# Patient Record
Sex: Male | Born: 1935 | Race: White | Hispanic: No | Smoking: Former smoker
Health system: Southern US, Community
[De-identification: ages and names within clinical notes are randomized; demographics above are authoritative.]

## PROBLEM LIST (undated history)

## (undated) DIAGNOSIS — K219 Gastro-esophageal reflux disease without esophagitis: Secondary | ICD-10-CM

## (undated) DIAGNOSIS — Z8719 Personal history of other diseases of the digestive system: Secondary | ICD-10-CM

## (undated) DIAGNOSIS — Z950 Presence of cardiac pacemaker: Secondary | ICD-10-CM

## (undated) DIAGNOSIS — M7120 Synovial cyst of popliteal space [Baker], unspecified knee: Secondary | ICD-10-CM

## (undated) DIAGNOSIS — I714 Abdominal aortic aneurysm, without rupture, unspecified: Secondary | ICD-10-CM

## (undated) DIAGNOSIS — R42 Dizziness and giddiness: Secondary | ICD-10-CM

## (undated) DIAGNOSIS — E785 Hyperlipidemia, unspecified: Secondary | ICD-10-CM

## (undated) DIAGNOSIS — M549 Dorsalgia, unspecified: Secondary | ICD-10-CM

## (undated) DIAGNOSIS — I119 Hypertensive heart disease without heart failure: Secondary | ICD-10-CM

## (undated) DIAGNOSIS — I480 Paroxysmal atrial fibrillation: Secondary | ICD-10-CM

## (undated) DIAGNOSIS — I219 Acute myocardial infarction, unspecified: Secondary | ICD-10-CM

## (undated) DIAGNOSIS — I739 Peripheral vascular disease, unspecified: Secondary | ICD-10-CM

## (undated) DIAGNOSIS — I10 Essential (primary) hypertension: Secondary | ICD-10-CM

## (undated) DIAGNOSIS — L719 Rosacea, unspecified: Secondary | ICD-10-CM

## (undated) DIAGNOSIS — H9190 Unspecified hearing loss, unspecified ear: Secondary | ICD-10-CM

## (undated) DIAGNOSIS — J189 Pneumonia, unspecified organism: Secondary | ICD-10-CM

## (undated) DIAGNOSIS — I350 Nonrheumatic aortic (valve) stenosis: Secondary | ICD-10-CM

## (undated) DIAGNOSIS — R55 Syncope and collapse: Secondary | ICD-10-CM

## (undated) DIAGNOSIS — G4733 Obstructive sleep apnea (adult) (pediatric): Secondary | ICD-10-CM

## (undated) DIAGNOSIS — I209 Angina pectoris, unspecified: Secondary | ICD-10-CM

## (undated) DIAGNOSIS — M1990 Unspecified osteoarthritis, unspecified site: Secondary | ICD-10-CM

## (undated) DIAGNOSIS — Z9989 Dependence on other enabling machines and devices: Secondary | ICD-10-CM

## (undated) DIAGNOSIS — I2510 Atherosclerotic heart disease of native coronary artery without angina pectoris: Secondary | ICD-10-CM

## (undated) DIAGNOSIS — I38 Endocarditis, valve unspecified: Secondary | ICD-10-CM

## (undated) DIAGNOSIS — J449 Chronic obstructive pulmonary disease, unspecified: Secondary | ICD-10-CM

## (undated) DIAGNOSIS — I779 Disorder of arteries and arterioles, unspecified: Secondary | ICD-10-CM

## (undated) DIAGNOSIS — D696 Thrombocytopenia, unspecified: Secondary | ICD-10-CM

## (undated) DIAGNOSIS — I495 Sick sinus syndrome: Secondary | ICD-10-CM

## (undated) DIAGNOSIS — R011 Cardiac murmur, unspecified: Secondary | ICD-10-CM

## (undated) DIAGNOSIS — C61 Malignant neoplasm of prostate: Secondary | ICD-10-CM

## (undated) DIAGNOSIS — K7011 Alcoholic hepatitis with ascites: Secondary | ICD-10-CM

## (undated) HISTORY — DX: Peripheral vascular disease, unspecified: I73.9

## (undated) HISTORY — DX: Obstructive sleep apnea (adult) (pediatric): G47.33

## (undated) HISTORY — PX: APPENDECTOMY: SHX54

## (undated) HISTORY — PX: CARDIAC VALVE REPLACEMENT: SHX585

## (undated) HISTORY — DX: Dizziness and giddiness: R42

## (undated) HISTORY — DX: Abdominal aortic aneurysm, without rupture: I71.4

## (undated) HISTORY — DX: Thrombocytopenia, unspecified: D69.6

## (undated) HISTORY — DX: Gastro-esophageal reflux disease without esophagitis: K21.9

## (undated) HISTORY — DX: Sick sinus syndrome: I49.5

## (undated) HISTORY — DX: Synovial cyst of popliteal space (Baker), unspecified knee: M71.20

## (undated) HISTORY — PX: SUPRAPUBIC PROSTATECTOMY: SUR1056

## (undated) HISTORY — PX: CATARACT EXTRACTION W/ INTRAOCULAR LENS  IMPLANT, BILATERAL: SHX1307

## (undated) HISTORY — DX: Unspecified hearing loss, unspecified ear: H91.90

## (undated) HISTORY — DX: Abdominal aortic aneurysm, without rupture, unspecified: I71.40

## (undated) HISTORY — DX: Syncope and collapse: R55

## (undated) HISTORY — PX: TONSILLECTOMY: SUR1361

## (undated) HISTORY — DX: Atherosclerotic heart disease of native coronary artery without angina pectoris: I25.10

## (undated) HISTORY — DX: Rosacea, unspecified: L71.9

## (undated) HISTORY — DX: Essential (primary) hypertension: I10

## (undated) HISTORY — PX: COLONOSCOPY: SHX174

## (undated) HISTORY — DX: Dependence on other enabling machines and devices: Z99.89

## (undated) HISTORY — DX: Nonrheumatic aortic (valve) stenosis: I35.0

## (undated) HISTORY — DX: Paroxysmal atrial fibrillation: I48.0

## (undated) HISTORY — DX: Hyperlipidemia, unspecified: E78.5

## (undated) HISTORY — DX: Personal history of other diseases of the digestive system: Z87.19

## (undated) HISTORY — DX: Dorsalgia, unspecified: M54.9

## (undated) HISTORY — DX: Chronic obstructive pulmonary disease, unspecified: J44.9

---

## 1992-03-17 DIAGNOSIS — I219 Acute myocardial infarction, unspecified: Secondary | ICD-10-CM

## 1992-03-17 HISTORY — DX: Acute myocardial infarction, unspecified: I21.9

## 1992-03-29 HISTORY — PX: CORONARY ANGIOPLASTY: SHX604

## 1994-03-17 DIAGNOSIS — I209 Angina pectoris, unspecified: Secondary | ICD-10-CM

## 1994-03-17 HISTORY — DX: Angina pectoris, unspecified: I20.9

## 1995-03-18 HISTORY — PX: CORONARY ARTERY BYPASS GRAFT: SHX141

## 1997-07-04 ENCOUNTER — Other Ambulatory Visit: Admission: RE | Admit: 1997-07-04 | Discharge: 1997-07-04 | Payer: Self-pay | Admitting: Family Medicine

## 1997-08-10 ENCOUNTER — Ambulatory Visit (HOSPITAL_COMMUNITY): Admission: RE | Admit: 1997-08-10 | Discharge: 1997-08-10 | Payer: Self-pay | Admitting: Internal Medicine

## 1997-08-15 ENCOUNTER — Other Ambulatory Visit: Admission: RE | Admit: 1997-08-15 | Discharge: 1997-08-15 | Payer: Self-pay | Admitting: Family Medicine

## 1997-11-28 ENCOUNTER — Observation Stay (HOSPITAL_COMMUNITY): Admission: RE | Admit: 1997-11-28 | Discharge: 1997-11-29 | Payer: Self-pay | Admitting: Cardiovascular Disease

## 1997-11-28 DIAGNOSIS — R55 Syncope and collapse: Secondary | ICD-10-CM

## 1997-11-28 HISTORY — PX: INSERT / REPLACE / REMOVE PACEMAKER: SUR710

## 1997-11-28 HISTORY — DX: Syncope and collapse: R55

## 1998-02-11 ENCOUNTER — Ambulatory Visit: Admission: RE | Admit: 1998-02-11 | Discharge: 1998-02-11 | Payer: Self-pay | Admitting: Family Medicine

## 1999-01-24 ENCOUNTER — Ambulatory Visit (HOSPITAL_COMMUNITY): Admission: RE | Admit: 1999-01-24 | Discharge: 1999-01-24 | Payer: Self-pay | Admitting: Family Medicine

## 2000-09-02 ENCOUNTER — Encounter: Payer: Self-pay | Admitting: Family Medicine

## 2000-09-02 ENCOUNTER — Ambulatory Visit (HOSPITAL_COMMUNITY): Admission: RE | Admit: 2000-09-02 | Discharge: 2000-09-02 | Payer: Self-pay | Admitting: Family Medicine

## 2001-03-17 HISTORY — PX: CARDIAC CATHETERIZATION: SHX172

## 2001-12-24 ENCOUNTER — Encounter: Payer: Self-pay | Admitting: Emergency Medicine

## 2001-12-24 ENCOUNTER — Emergency Department (HOSPITAL_COMMUNITY): Admission: EM | Admit: 2001-12-24 | Discharge: 2001-12-24 | Payer: Self-pay | Admitting: Emergency Medicine

## 2002-03-01 ENCOUNTER — Ambulatory Visit (HOSPITAL_COMMUNITY): Admission: RE | Admit: 2002-03-01 | Discharge: 2002-03-01 | Payer: Self-pay | Admitting: Cardiovascular Disease

## 2002-03-01 ENCOUNTER — Encounter: Payer: Self-pay | Admitting: Cardiovascular Disease

## 2002-07-03 ENCOUNTER — Encounter: Payer: Self-pay | Admitting: Emergency Medicine

## 2002-07-03 ENCOUNTER — Emergency Department (HOSPITAL_COMMUNITY): Admission: EM | Admit: 2002-07-03 | Discharge: 2002-07-03 | Payer: Self-pay | Admitting: Emergency Medicine

## 2003-04-13 ENCOUNTER — Encounter: Admission: RE | Admit: 2003-04-13 | Discharge: 2003-04-13 | Payer: Self-pay | Admitting: Urology

## 2003-05-25 ENCOUNTER — Inpatient Hospital Stay (HOSPITAL_COMMUNITY): Admission: RE | Admit: 2003-05-25 | Discharge: 2003-05-28 | Payer: Self-pay | Admitting: Urology

## 2003-05-25 ENCOUNTER — Encounter (INDEPENDENT_AMBULATORY_CARE_PROVIDER_SITE_OTHER): Payer: Self-pay | Admitting: Specialist

## 2003-11-06 ENCOUNTER — Encounter: Admission: RE | Admit: 2003-11-06 | Discharge: 2003-11-06 | Payer: Self-pay | Admitting: Sports Medicine

## 2003-12-21 ENCOUNTER — Ambulatory Visit: Payer: Self-pay | Admitting: Nurse Practitioner

## 2004-04-04 ENCOUNTER — Ambulatory Visit: Payer: Self-pay | Admitting: Nurse Practitioner

## 2004-12-20 ENCOUNTER — Ambulatory Visit: Payer: Self-pay | Admitting: Nurse Practitioner

## 2005-07-29 ENCOUNTER — Ambulatory Visit: Payer: Self-pay | Admitting: Nurse Practitioner

## 2005-12-01 ENCOUNTER — Ambulatory Visit: Payer: Self-pay | Admitting: Nurse Practitioner

## 2006-06-29 ENCOUNTER — Ambulatory Visit: Payer: Self-pay | Admitting: Nurse Practitioner

## 2006-08-27 ENCOUNTER — Encounter: Admission: RE | Admit: 2006-08-27 | Discharge: 2006-08-27 | Payer: Self-pay | Admitting: Obstetrics and Gynecology

## 2006-08-31 ENCOUNTER — Ambulatory Visit: Admission: RE | Admit: 2006-08-31 | Discharge: 2006-12-22 | Payer: Self-pay | Admitting: Radiation Oncology

## 2006-11-24 ENCOUNTER — Ambulatory Visit: Admission: RE | Admit: 2006-11-24 | Discharge: 2006-12-22 | Payer: Self-pay | Admitting: Radiation Oncology

## 2007-01-06 ENCOUNTER — Emergency Department (HOSPITAL_COMMUNITY): Admission: EM | Admit: 2007-01-06 | Discharge: 2007-01-06 | Payer: Self-pay | Admitting: Emergency Medicine

## 2007-03-03 ENCOUNTER — Ambulatory Visit: Payer: Self-pay | Admitting: Family Medicine

## 2007-05-26 ENCOUNTER — Emergency Department (HOSPITAL_COMMUNITY): Admission: EM | Admit: 2007-05-26 | Discharge: 2007-05-26 | Payer: Self-pay | Admitting: Emergency Medicine

## 2007-10-07 ENCOUNTER — Ambulatory Visit: Payer: Self-pay | Admitting: Internal Medicine

## 2009-06-26 ENCOUNTER — Encounter: Admission: RE | Admit: 2009-06-26 | Discharge: 2009-06-26 | Payer: Self-pay | Admitting: Cardiovascular Disease

## 2009-07-05 ENCOUNTER — Ambulatory Visit (HOSPITAL_COMMUNITY): Admission: RE | Admit: 2009-07-05 | Discharge: 2009-07-05 | Payer: Self-pay | Admitting: Cardiovascular Disease

## 2009-08-15 ENCOUNTER — Ambulatory Visit (HOSPITAL_COMMUNITY): Admission: RE | Admit: 2009-08-15 | Discharge: 2009-08-15 | Payer: Self-pay | Admitting: Cardiovascular Disease

## 2009-08-15 HISTORY — PX: PACEMAKER GENERATOR CHANGE: SHX5998

## 2009-09-26 ENCOUNTER — Emergency Department (HOSPITAL_COMMUNITY): Admission: EM | Admit: 2009-09-26 | Discharge: 2009-09-26 | Payer: Self-pay | Admitting: Emergency Medicine

## 2009-10-07 ENCOUNTER — Emergency Department (HOSPITAL_COMMUNITY): Admission: EM | Admit: 2009-10-07 | Discharge: 2009-10-07 | Payer: Self-pay | Admitting: Emergency Medicine

## 2009-10-07 ENCOUNTER — Encounter (INDEPENDENT_AMBULATORY_CARE_PROVIDER_SITE_OTHER): Payer: Self-pay | Admitting: Emergency Medicine

## 2009-10-07 ENCOUNTER — Ambulatory Visit: Payer: Self-pay | Admitting: Vascular Surgery

## 2010-06-01 LAB — CBC
Hemoglobin: 13 g/dL (ref 13.0–17.0)
MCH: 34 pg (ref 26.0–34.0)
MCHC: 35.6 g/dL (ref 30.0–36.0)
Platelets: 146 10*3/uL — ABNORMAL LOW (ref 150–400)
RBC: 3.83 MIL/uL — ABNORMAL LOW (ref 4.22–5.81)
RDW: 13.4 % (ref 11.5–15.5)
WBC: 3.7 10*3/uL — ABNORMAL LOW (ref 4.0–10.5)

## 2010-06-01 LAB — D-DIMER, QUANTITATIVE: D-Dimer, Quant: 0.56 ug/mL-FEU — ABNORMAL HIGH (ref 0.00–0.48)

## 2010-06-01 LAB — DIFFERENTIAL
Basophils Absolute: 0 10*3/uL (ref 0.0–0.1)
Basophils Relative: 0 % (ref 0–1)
Lymphocytes Relative: 26 % (ref 12–46)
Monocytes Absolute: 0.5 10*3/uL (ref 0.1–1.0)
Monocytes Relative: 14 % — ABNORMAL HIGH (ref 3–12)

## 2010-06-02 LAB — URINALYSIS, ROUTINE W REFLEX MICROSCOPIC
Glucose, UA: NEGATIVE mg/dL
Nitrite: NEGATIVE
Specific Gravity, Urine: 1.022 (ref 1.005–1.030)
pH: 5.5 (ref 5.0–8.0)

## 2010-06-03 LAB — SURGICAL PCR SCREEN
MRSA, PCR: NEGATIVE
Staphylococcus aureus: NEGATIVE

## 2010-06-03 LAB — PROTIME-INR: INR: 1.02 (ref 0.00–1.49)

## 2010-08-02 NOTE — Cardiovascular Report (Signed)
[name], [name]                           ACCOUNT NO.:  [id number]   MEDICAL RECORD NO.:  [id number]                   PATIENT TYPE:  OIB   LOCATION:  2855                                 FACILITY:  [facility]   PHYSICIAN:  Richard A. Alanda Amass, M.D.          DATE OF BIRTH:  [birth year]   DATE OF PROCEDURE:  DATE OF DISCHARGE:                              CARDIAC CATHETERIZATION   PROCEDURES:  Retrograde central aortic catheterization, selective coronary  angiography by Judkins technique, saphenous vein graft angiography,  subselective - selective left internal mammary artery injection, left  ventricular angiogram RAO, LAO projection, abdominal aortic angiogram mid  stream and PA projection, right iliac angiogram hand injection.   DESCRIPTION OF PROCEDURE:  The patient was hydrated preoperatively in the  postabsorptive state and had normal preoperative laboratory with admission  as an outpatient and creatinine at 0.6.  He was brought to the second floor  CP lab in the postabsorptive state after 5 mg of Valium p.o. premedication.  The right groin was prepped, draped in the usual manner.  Then 1% Xylocaine  was used for local anesthesia, and the RFA was entered with a single  anterior puncture using an 18 thin wall needle.  A 6 French short Daig side-  arm sheath was inserted without difficulty. Catheterization was performed  using guide wire exchange because of the patient's known iliac stenosis and  infrarenal abdominal aneurysm.  A Cordis 6 French 4 cm taper preformed right  and left coronary catheters were used for selective coronary angiography.  The right coronary catheter was used for selective saphenous vein graft  angiography.  The subselective LIMA was done with the right coronary  catheter. We then used a Cordis and a Scimed IMA catheter but was not able  to get selective injections.  We were also able to get only subselective  injections with a VB-1 catheter.  There was,  however, good visualization  antegrade and there was good retrograde visualization through the native  coronary arteries to see the LIMA anastomosis and distal LAD.  LV angiogram  was done in the RAO and LAO projection at 25 cc 14 cc/sec. and 20 cc 12  cc/sec. through a 6 French angled pigtail Cordis catheter.  Pullback  pressure of the CA was performed and showed no gradient across the aortic  valve.  Abdominal aortic angiogram was done in the mid stream PA projection  above the level of the renal arteries at 25 cc 20 cc/sec. with runoff to the  right iliac region.  Right iliac injection was done through the side-arm  sheath by hand injection.  The catheter was removed, side-arm sheath was  flushed and the patient was brought to the holding area for sheath removal  and pressure hemostasis.  He tolerated the procedure well.  He had good  urine output throughout the procedure.   PRESSURES:  LV:  135/0, LVEDP 18 mmHg.  CA:  135/65 mmHg.  There is no  gradient across the aortic valve on pullback.   Fluoroscopy revealed 2-3+ proximal LAD, circumflex, and right coronary  calcification.  There was no significant intracardiac or valvular  calcification seen.   LEFT VENTRICULAR ANGIOGRAM:  The RAO and LAO projection displayed focal wall  motion abnormalities of the mid anterolateral wall, the mid to distal third  of the inferior wall and infra-apical segment and posterior apical segment.  Overall, ejection fraction was well preserved. However, at approximately 50%  there is no significant mitral regurgitation present.   ABDOMINAL AORTOGRAPHY:  Abdominal aortic angiogram in the mid stream PA  projection shows single and normal-appearing renal arteries bilaterally.  There was a moderate sized infrarenal abdominal aortic aneurysm visualized  with a long normal-appearing infrarenal neck.  The aneurysm was  angiographically began above the IMA and extended to the iliac bifurcation.  There was only  mild proximal iliac dilatation.  The left hypogastric  appeared to have a high-grade stenosis but was present.  The right  hypogastric was intact and there was a 60% calcific narrowing of the  proximal REIA. The remainder of the external iliac had no significant  stenosis and the proximal SFA profunda junction was intact.  The sheath was  placed in the common femoral artery.   NATIVE CORONARY ANGIOGRAPHY:  The main left coronary had approximately 40-  50% narrowing concentrically, but good residual lumen and good antegrade  flow.  The proximal LAD had 60% narrowing before the first diagonal branch.  The first diagonal branch had 95% segmental stenosis but there was some  antegrade filling.  There was another 70-80% stenosis between the first and  second diagonal branch at the proximal third of the LAD.  There was  antegrade filling of the LAD to the apex where it bifurcated and retrograde  filling of the intact LIMA which was anastomosed to just beyond the junction  of the proximal third of the LAD with an excellent anastomotic site and no  significant LAD disease beyond with a good vessel that was slightly  tortuous, smooth, and filled to the apex.   There was also retrograde filling of the sequential CX, OM - distal CX  graft.   The native circumflex had 50% narrowing beyond the OM-1, 70-80% narrowing  beyond the small OM-2 and before a PAVG branch. There was another 80-90%  narrowing beyond the OM-3 in this codominant circumflex.  The OM-2 and OM-3  were not significantly jeopardized. There was good retrograde filling of the  graft beyond the distal circumflex lesion.   Native right coronary artery was patent and showed 50-60% calcific narrowing  at the junction of the proximal third just beyond the bend, 40% narrowing in  the midportion, 40-50% before the acute margin and 50% eccentric narrowing in the distal third before the PLA, PDA bifurcation after an RV branch.  There was a  large RV branch in the proximal third that bifurcated without  significant stenosis and a moderately large RV branch beyond the crux that  was patent.   Saphenous vein graft to the RCA was occluded at its origin.   Saphenous vein graft sequential to the large OM-1 and distal circumflex was  widely patent and the vein graft was smooth, excellent anastomosis to both  vessels with excellent flow.  The OM-1 bifurcated, was fairly large with no  significant stenosis.  The distal circumflex marginal - PDA was intact with  no significant stenosis to the second  PDA filled well and there was  retrograde filling to the mid circumflex to the graft.   The LIMA was widely patent.  Vertebral was antegrade.  Excellent anastomosis  to just beyond the junction of the proximal third as outlined above.  Retrograde filling to the native circumflex demonstrated an excellent LIMA  anastomosis and distal LAD.   DISCUSSION:  This 8 year old widowed father of five with seven  grandchildren, is a retired Actor.  He is a long-term patient of mine.  He  suffered a systolic ejection period myocardial infarction March 28, 1992,  and had circumflex PTCA March 30, 1992.  Because of progression of  disease, he underwent multivessel CABG x5 by Dr. Laneta Simmers February 10, 1996.  He has been treated medically since that time and has done well.  He has a  known infrarenal abdominal aortic aneurysm, followed as an outpatient, that  is asymptomatic and known right external iliac narrowing without  claudication.   He was recently seen by Dr. Tresa Endo in my absence for an episode of chest pain  requiring nitroglycerin. It was not clear whether it was GI or cardiac.  Cardiolite was performed on February 17, 2002, and this showed evidence of  anterolateral ischemia and inferoseptal ischemia with EF of 56%.  It was  felt to be of medium to high risk.  He has not any significant recent chest  pain since his medical therapy since  his last doctor's visit.   Catheterization fortunately demonstrates excellent grafts with widely patent  LIMA to the LAD.  The saphenous vein graft to the diagonal #1 is widely  patent with an excellent anastomosis to the large diagonal #1 that  bifurcates twice with good filling.  The sequential saphenous vein graft to  the large OM-1 and distal circumflex is widely patent as well.  The vein  graft to the right is occluded but there is good flow to the native vessel  which does have spotty proximal mid and distal moderate disease. LV function  is well preserved with segmental wall motion abnormalities.  I would  recommend continued medical therapy of his coronary disease at this time.  He is at risk of progression of disease, particularly with native right  coronary.   It should be noted that fluoroscopy showed good position of his atrial and ventricular electrodes from his previously placed PTVP of November 18, 1997,  for symptomatic bradycardia with sinus arrest and recurrent syndrome and  presyncope resolved.   CATHETERIZATION DIAGNOSES:  1. Arteriosclerotic heart disease.     a. Status post systolic ejection period myocardial infarction, March 28, 1992.     b. Status post percutaneous transluminal coronary angioplasty codominant        circumflex, March 30, 1992.     c. Coronary artery bypass graft x5, February 10, 1996.  2. Recurrent chest pain compatible with angina with abnormal Cardiolite,     February 17, 2002.  3. Occluded saphenous vein graft to right coronary artery.  4. Patent sequential saphenous vein graft to large obtuse marginal #1 -     distal circumflex obtuse marginal.  5. Patent left internal mammary artery to left anterior descending.  6. Patent saphenous vein graft to diagonal.  7. Well preserved left ventricular function, ejection fraction 50% with     segmental wall motion abnormalities.  8. 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  9. Infrarenal abdominal aortic aneurysm - long infrarenal neck and normal  single renal arteries bilaterally.  10.      Remote smoker.  11.      Hyperlipidemia, on therapy.  12.      Systemic hypertension.  13.      Arthritis.                                                 Richard A. Alanda Amass, M.D.    RAW/MEDQ  D:  03/01/2002  T:  03/01/2002  Job:  [id number]   cc:   CP Laboratory   Nanetta Batty, M.D.  [address]  [place]  [place] [address]  Fax: [phone]   Lucy Chris, Dr.  Garry Heater, M.D.  [address]  [place]  [place] [address]  Fax: [phone]   [facility]

## 2010-08-02 NOTE — Op Note (Signed)
[name], [name]                           ACCOUNT NO.:  [id number]   MEDICAL RECORD NO.:  [id number]                   PATIENT TYPE:  INP   LOCATION:  X002                                 FACILITY:  [facility]   PHYSICIAN:  Excell Seltzer. Annabell Howells, M.D.                 DATE OF BIRTH:  [birth year]   DATE OF PROCEDURE:  05/25/2003  DATE OF DISCHARGE:                                 OPERATIVE REPORT   OPERATION/PROCEDURE:  Radical retropubic prostatectomy with pelvic  lymphadenectomy.   PREOPERATIVE DIAGNOSIS:  Prostate cancer.   POSTOPERATIVE DIAGNOSIS:  Prostate cancer.   SURGEON:  Excell Seltzer. Annabell Howells, M.D.   ASSISTANT:  Thyra Breed, M.D.   ANESTHESIA:  General.   DRAINS:  Foley catheter and Blake drain.   ESTIMATED BLOOD LOSS:  800 mL.   COMPLICATIONS:  None.   INDICATIONS:  Mr. Trickey is a 76 year old white male who was found to have  a Gleason's 7, stage T2A adenocarcinoma of the prostate.  He has selected  radical prostatectomy for treatment.   DESCRIPTION OF PROCEDURE:  The patient was given 1 g of Ancef.  He was  fitted with _________ and PSO's.  He was taken to the operating room where  general anesthesia was induced.  A bump was placed under his lower back.  His lower abdomen was shaved.  He was prepped with Betadine solution and  draped in the usual sterile fashion.  A 20-French Foley catheter was  inserted and the bladder was drained.   A lower midline incision was made with a knife from the pubis to just below  the umbilicus.  This was carried down through the subcutaneous tissues and  anterior rectus fascia.  The subcutaneous bleeders were controlled with the  Bovie.  The rectus muscle was identified and parted in the midline.  Transversalis fascia was opened and blunt dissection was used to expose the  right and left pelvic fossa.  A vessel was torn during this dissection.  It  required clamping and ligation with a 3-0 silk tie.  The right iliac fossa  was exposed with  malleable retractors using the Buchwalter and dissection of  the lymph node packet was initiated over the iliac vein.  During the  dissection, inferior to the iliac vein, small vein was avulsed.  There was  some rather brisk bleeding.  This was packed for five minutes.  The pack was  then removed which had produced partial control of the bleeding.  The vessel  was then oversewn with a 4-0 Prolene suture with control of the bleeding.  The lymph node dissection was then completed with the limits of dissection  being the external iliac vein, the obturator nerve, the circumflex iliac  vein, and the bifurcation of the iliac artery.  Large clips were used to  control vascular and lymphatic channels.  The obturator nerve was protected  during the procedure.  No obvious gross nodal disease was noted.  The left  node dissection was then performed without event, once again using large  clips for control of lymphatic vascular channels.  The inferior portion of  the node packet was controlled with a 2-0 silk tie as had been the right  node packet.   Once the node dissections had been performed and no obvious gross nodal  disease was noted, we began dissection on the prostate.   The retractors were adjusted.  The endopelvic fascia was identified and  punctured on the right.  Blunt dissection was used to free up the lateral  aspect of the prostate.  This was then repeated on the left side.  The  fascia was then incised superiorly as it reflected over the prostate.  The  fat overlying the dorsal vein complex was teased away to narrow this area.  The puboprostatic ligaments were taken down at their attachment to the pubis  laterally.  Those in the more midline area were left intact.  The  Hohenfellner clamp was then placed beneath the dorsal vein complex.  A #1  Vicryl tie was placed and tied around the dorsal vein complex.  An Allis  clamp was used to grasp the edges of the endopelvic fascia and  approximate  them over the prostate.  A 2-0 Vicryl suture on a UR5 needle was used in a  figure-of-eight fashion to control potential backbleeding vessels.  The  dorsal vein complex was then divided, exposing the apex of the prostate and  urethra.  A Vanderbilt was used to dissect the neurovascular bundle off the  urethra laterally on each side and a moistened umbilical tape was then  passed beneath the urethra using a right angle clamp.  The anterior urethra  was divided with scissors at the apex of the prostate and the Foley catheter  was lubricated and pulled into the wound.  It was divided and used to  provide cephalad traction on the prostate.  The posterior urethra was then  divided, then the rectourethralis attachments were taken down bluntly.  The  prostate was dissected off the rectum bluntly.  The lateral pedicles were  then taken down using large right angle clips.  Once the prostate had been  sufficiently elevated, the anterior leaf of Denonvilliers fascia was incised  over the seminal vesicles.  The ampulla of the vas were identified,  dissected out and ligated using large clips.  The seminal vesicles were  dissected out and ligated using large clips.   We then turned out attention anteriorly where the bladder neck was grasped  between Allis clamps.  The Bovie and a tonsil clamp were used to dissect out  the bladder neck fibers.  Once the anterior bladder neck was divided, the  Foley catheter balloon was deflated and the Foley catheter was brought from  the bladder and used to provide traction on the prostate.  The ureteral  orifices were identified effluxing blue urine from the indigo carmine  injection.  The posterior bladder neck was then divided along with the  remaining posterior prostatic attachments.  The bladder neck was then  reconstructed with initial eversion of the bladder neck mucosa anteriorly using interrupted 4-0 chromic stitches.  The bladder neck was then   reapproximated in a tennis racquet fashion using a running 2-0 chromic  stitch.  Care  was taken to avoid the ureteral orifices.  The residual  bladder neck was large enough to admit the tip of my  fifth finger.  At this  point an additional layer was secured to the posterior bladder wall and  bladder neck area using a running 2-0 chromic stitch.  At this point, fresh  Foley catheter was inserted and anastomotic sutures with 2-0 Vicryl were  placed from bladder neck to urethral stump at 2, 5, 7, and 10 o'clock.  The  Foley catheter was then placed through the bladder neck into the bladder.  The balloon was inflated with 15 mL of sterile fluid and the 12 o'clock  stitch was placed from the bladder neck to the urethral stump.   At this point the malleable retractor was removed and the bladder neck was  positioned against the urethral stump, and the anastomotic sutures were tied  and trimmed.  At this point the bladder was irrigated, the anastomosis was  found to be watertight.  There was no bleeding within the bladder.  A #10  flat Foley, fluted Blake drain was then placed through a separate stab wound  lateral to the left side of the abdominal incision.  This was positioned  over the anastomotic area and the pelvic fossa.  The wound was then closed  using a running #1 PDS.  The subcutaneous tissues were irrigated with  sterile saline and the skin was closed with clips.  The Foley catheter was  irrigated once again.  No bleeding was noted.  The catheter was placed to  straight drainage, the drain to bulb suction.  During the  counts, the instrument count was correct.  However, there was an incorrect  needle count.  A KUB film was obtained.  No retained needles or instruments  were noted.  At this point the patient's anesthetic was reversed.  He was  moved to the recovery room in stable condition.  There were no complications  during the procedure.                                                Excell Seltzer. Annabell Howells, M.D.    JJW/MEDQ  D:  05/25/2003  T:  05/25/2003  Job:  [id number]   cc:   Gerlene Burdock A. Alanda Amass, M.D.  [phone], [address]  [place]  [place] [address]  Fax: [phone]

## 2010-08-02 NOTE — Discharge Summary (Signed)
[name], [name]                           ACCOUNT NO.:  [id number]   MEDICAL RECORD NO.:  [id number]                   PATIENT TYPE:  INP   LOCATION:  0373                                 FACILITY:  [facility]   PHYSICIAN:  Excell Seltzer. Annabell Howells, M.D.                 DATE OF BIRTH:  20-Aug-1934   DATE OF ADMISSION:  05/25/2003  DATE OF DISCHARGE:  05/28/2003                                 DISCHARGE SUMMARY   DATE OF ADMISSION:  May 25, 2003   DATE OF DISCHARGE:  May 28, 2003   ADMIT DIAGNOSIS:  Prostate cancer.   DISCHARGE DIAGNOSIS:  Prostate cancer.   PROCEDURES PERFORMED:  (May 25, 2003) Radical retropubic prostatectomy  with bilateral pelvic lymph node dissection.   HISTORY OF PRESENT ILLNESS:  This is a 76 year old white male who underwent  transrectal ultrasound-guided biopsy for an elevated PSA of 4.87 as well as  a palpable prostatic nodule on digital rectal exam.  This returned with  diagnosis of a Gleason 4 + 3 = 7 adenocarcinoma involving 30% of his left  biopsy specimen consistent with P2a adenocarcinoma of the prostate.  The  patient was informed of all alternatives including watchful waiting,  radiation therapy, seed implantation, versus radical retropubic  prostatectomy.  The patient has decided to manage his cancer with definitive  surgical therapy.  He understands all the risks, benefits and alternatives  of the procedure and is willing to proceed.  He is admitted at this time to  undergo a radical retropubic prostatectomy with pelvic lymph node  dissection.   HOSPITAL COURSE:  Following admission, the patient was taken to the  operating room where he underwent a radical retropubic prostatectomy with  bilateral pelvic lymph node dissection.  The patient tolerated the procedure  well and there were no complications.  For a full summary of this surgical  procedure, please see the dictated operative note dated May 25, 2003.  Postoperatively the patient was  transferred to the floor where he did well  overnight into postoperative day #1.  [name] pain was well  controlled.  [name] JP output was minimal throughout hospitalization.  In fact, his JP drain was removed on postoperative day #2.  The patient was  given Dulcolax suppositories on postoperative day #2 to stimulate bowel  movement.  By postoperative day #3, the patient was having regular bowel  movement, pain was well controlled on p.o. pain meds and was ambulating  without assistance.  He was therefore ready for discharge.  The remainder of  the hospital course was uneventful.   MEDICATIONS ON DISCHARGE:  1. Vicodin.  2. Levaquin.  3. Zocor.  4. Altace.  5. Protonix.  6. Norvasc.  7. Doxycycline.  8. Nabumetone.   DIET AND ACTIVITY:  The patient is discharged to home on a regular diet.  Activity level is restricted and to include no bending, stooping, lifting,  driving, or moving heavy objects.  The patient is otherwise encouraged to  ambulate at least three times per day.   CONDITION ON DISCHARGE/FOLLOW UP:  On the day of discharge the patient is  tolerating p.o. diet, ambulating without assistance, and has good urine  output through his Foley catheter.   The patient has been instructed to call Dr. Belva Crome office or the on-call  physician with any further questions or concerns.  This includes a  temperature greater than 101.5, intractable vomiting, or any erythema,  drainage or opening of the patient's surgical wound.   A follow up appointment will be scheduled in 7 to 10 days for staple  removal.  The patient has been given a prescription of Levaquin to begin one  day prior to his appointment for Foley catheter removal.     Charlette Caffey, MD                    Excell Seltzer. Annabell Howells, M.D.    EG/MEDQ  D:  06/13/2003  T:  06/13/2003  Job:  [id number]

## 2010-12-04 ENCOUNTER — Other Ambulatory Visit: Payer: Self-pay | Admitting: Gastroenterology

## 2010-12-09 LAB — URINALYSIS, ROUTINE W REFLEX MICROSCOPIC
Bilirubin Urine: NEGATIVE
Ketones, ur: NEGATIVE
Specific Gravity, Urine: 1.026

## 2010-12-09 LAB — CBC
HCT: 40
Hemoglobin: 14.1

## 2010-12-09 LAB — URINE MICROSCOPIC-ADD ON

## 2010-12-09 LAB — DIFFERENTIAL
Basophils Absolute: 0
Basophils Relative: 1
Eosinophils Relative: 1
Monocytes Relative: 15 — ABNORMAL HIGH
Neutrophils Relative %: 56

## 2010-12-09 LAB — RAPID URINE DRUG SCREEN, HOSP PERFORMED
Benzodiazepines: POSITIVE — AB
Cocaine: NOT DETECTED
Tetrahydrocannabinol: NOT DETECTED

## 2010-12-09 LAB — COMPREHENSIVE METABOLIC PANEL
ALT: 33
AST: 36
Albumin: 3.8
BUN: 13
CO2: 25
Calcium: 9.4
Creatinine, Ser: 0.86
Glucose, Bld: 113 — ABNORMAL HIGH
Sodium: 140

## 2010-12-09 LAB — POCT CARDIAC MARKERS
CKMB, poc: <1 — ABNORMAL LOW
Myoglobin, poc: 54.2
Operator id: 277751
Troponin i, poc: <0.05

## 2010-12-09 LAB — ETHANOL: Alcohol, Ethyl (B): <5

## 2011-05-09 ENCOUNTER — Other Ambulatory Visit: Payer: Self-pay | Admitting: Dermatology

## 2012-02-13 ENCOUNTER — Other Ambulatory Visit: Payer: Self-pay | Admitting: Family Medicine

## 2012-02-13 DIAGNOSIS — M545 Low back pain: Secondary | ICD-10-CM

## 2012-02-17 ENCOUNTER — Ambulatory Visit
Admission: RE | Admit: 2012-02-17 | Discharge: 2012-02-17 | Disposition: A | Discharge status: Home / Self Care | Payer: PRIVATE HEALTH INSURANCE | Source: Ambulatory Visit | Attending: Family Medicine | Admitting: Family Medicine

## 2012-02-17 DIAGNOSIS — M545 Low back pain: Secondary | ICD-10-CM

## 2012-03-31 ENCOUNTER — Other Ambulatory Visit: Payer: Self-pay | Admitting: Gastroenterology

## 2012-04-22 ENCOUNTER — Other Ambulatory Visit (HOSPITAL_COMMUNITY): Payer: Self-pay | Admitting: Cardiovascular Disease

## 2012-04-22 DIAGNOSIS — I350 Nonrheumatic aortic (valve) stenosis: Secondary | ICD-10-CM

## 2012-05-04 ENCOUNTER — Ambulatory Visit (HOSPITAL_COMMUNITY)
Admission: RE | Admit: 2012-05-04 | Discharge: 2012-05-04 | Disposition: A | Discharge status: Home / Self Care | Payer: PRIVATE HEALTH INSURANCE | Source: Ambulatory Visit | Attending: Cardiovascular Disease | Admitting: Cardiovascular Disease

## 2012-05-04 DIAGNOSIS — I369 Nonrheumatic tricuspid valve disorder, unspecified: Secondary | ICD-10-CM | POA: Insufficient documentation

## 2012-05-04 DIAGNOSIS — I379 Nonrheumatic pulmonary valve disorder, unspecified: Secondary | ICD-10-CM | POA: Insufficient documentation

## 2012-05-04 DIAGNOSIS — I080 Rheumatic disorders of both mitral and aortic valves: Secondary | ICD-10-CM | POA: Insufficient documentation

## 2012-05-04 DIAGNOSIS — I10 Essential (primary) hypertension: Secondary | ICD-10-CM | POA: Insufficient documentation

## 2012-05-04 DIAGNOSIS — I350 Nonrheumatic aortic (valve) stenosis: Secondary | ICD-10-CM

## 2012-05-04 NOTE — Progress Notes (Signed)
2D Echo Performed 05/04/2012    Clearence Ped, RCS

## 2012-05-12 ENCOUNTER — Encounter: Payer: Self-pay | Admitting: Cardiology

## 2012-07-27 ENCOUNTER — Telehealth: Payer: Self-pay | Admitting: Cardiovascular Disease

## 2012-07-27 MED ORDER — OMEPRAZOLE 20 MG PO CPDR: 20.0000 mg | DELAYED_RELEASE_CAPSULE | Freq: Every day | ORAL | Status: DC

## 2012-07-27 NOTE — Addendum Note (Signed)
Addended byGaynelle Cage. on: 07/27/2012 04:50 PM   Modules accepted: Orders

## 2012-07-27 NOTE — Telephone Encounter (Signed)
Need a prescription refill on Omeprazole 20mg  .. The pharmacy said that they have faxed over the request and have not heard anything , [facility] [address] .[name] KitchenMarland [phone]

## 2012-07-27 NOTE — Telephone Encounter (Signed)
Sent refill request electronically.

## 2012-07-27 NOTE — Addendum Note (Signed)
Addended byGaynelle Cage. on: 07/27/2012 05:00 PM   Modules accepted: Orders

## 2012-07-28 ENCOUNTER — Telehealth: Payer: Self-pay | Admitting: *Deleted

## 2012-07-28 NOTE — Telephone Encounter (Signed)
Refill request faxed

## 2012-08-06 ENCOUNTER — Telehealth: Payer: Self-pay | Admitting: Cardiovascular Disease

## 2012-08-06 NOTE — Telephone Encounter (Signed)
Paper chart requested.  Message forwarded to [facility]. Berlinda Last, LPN for Dr. Alanda Amass.

## 2012-08-06 NOTE — Telephone Encounter (Signed)
[name] is needing someone to call Dr. Jama Flavors to give him surgical clearance for surgery ion his eyes.[name]..Surgery is schedule for the June 9th 2014..   Thanks

## 2012-09-16 ENCOUNTER — Other Ambulatory Visit: Payer: Self-pay

## 2012-09-16 NOTE — Telephone Encounter (Signed)
We are not the prescribing provider for Temazepam. Medication could not be found in patient profile/chart. Rx denied

## 2012-09-28 ENCOUNTER — Other Ambulatory Visit: Payer: Self-pay | Admitting: Cardiovascular Disease

## 2012-09-28 ENCOUNTER — Other Ambulatory Visit: Payer: Self-pay | Admitting: *Deleted

## 2012-09-28 DIAGNOSIS — R079 Chest pain, unspecified: Secondary | ICD-10-CM

## 2012-09-28 LAB — CBC WITH DIFFERENTIAL/PLATELET
Basophils Absolute: 0 10*3/uL (ref 0.0–0.1)
HCT: 39.2 % (ref 39.0–52.0)
Lymphocytes Relative: 45 % (ref 12–46)
Lymphs Abs: 1.5 10*3/uL (ref 0.7–4.0)
Neutro Abs: 1.3 10*3/uL — ABNORMAL LOW (ref 1.7–7.7)
Platelets: 118 10*3/uL — ABNORMAL LOW (ref 150–400)
RBC: 4.03 MIL/uL — ABNORMAL LOW (ref 4.22–5.81)
RDW: 13.4 % (ref 11.5–15.5)
WBC: 3.4 10*3/uL — ABNORMAL LOW (ref 4.0–10.5)

## 2012-09-28 LAB — COMPREHENSIVE METABOLIC PANEL
ALT: 25 U/L (ref 0–53)
AST: 32 U/L (ref 0–37)
Albumin: 4.1 g/dL (ref 3.5–5.2)
Calcium: 9.2 mg/dL (ref 8.4–10.5)
Chloride: 103 mEq/L (ref 96–112)
Potassium: 4.3 mEq/L (ref 3.5–5.3)
Total Protein: 6.9 g/dL (ref 6.0–8.3)

## 2012-09-28 LAB — TSH: TSH: 2.528 u[IU]/mL (ref 0.350–4.500)

## 2012-10-07 ENCOUNTER — Other Ambulatory Visit (HOSPITAL_COMMUNITY): Payer: Self-pay | Admitting: Cardiovascular Disease

## 2012-10-07 ENCOUNTER — Ambulatory Visit (HOSPITAL_COMMUNITY)
Admission: RE | Admit: 2012-10-07 | Discharge: 2012-10-07 | Disposition: A | Discharge status: Home / Self Care | Payer: PRIVATE HEALTH INSURANCE | Source: Ambulatory Visit | Attending: Cardiovascular Disease | Admitting: Cardiovascular Disease

## 2012-10-07 DIAGNOSIS — I10 Essential (primary) hypertension: Secondary | ICD-10-CM | POA: Insufficient documentation

## 2012-10-07 DIAGNOSIS — R0989 Other specified symptoms and signs involving the circulatory and respiratory systems: Secondary | ICD-10-CM | POA: Insufficient documentation

## 2012-10-07 DIAGNOSIS — R079 Chest pain, unspecified: Secondary | ICD-10-CM

## 2012-10-07 DIAGNOSIS — R0609 Other forms of dyspnea: Secondary | ICD-10-CM | POA: Insufficient documentation

## 2012-10-07 DIAGNOSIS — Z951 Presence of aortocoronary bypass graft: Secondary | ICD-10-CM

## 2012-10-07 DIAGNOSIS — R0602 Shortness of breath: Secondary | ICD-10-CM | POA: Insufficient documentation

## 2012-10-07 DIAGNOSIS — E669 Obesity, unspecified: Secondary | ICD-10-CM | POA: Insufficient documentation

## 2012-10-07 MED ORDER — TECHNETIUM TC 99M SESTAMIBI GENERIC - CARDIOLITE
30.0000 | Freq: Once | INTRAVENOUS | Status: AC | PRN
  Administered 2012-10-07: 30 via INTRAVENOUS

## 2012-10-07 MED ORDER — REGADENOSON 0.4 MG/5ML IV SOLN
0.4000 mg | Freq: Once | INTRAVENOUS | Status: AC
  Administered 2012-10-07: 0.4 mg via INTRAVENOUS

## 2012-10-07 MED ORDER — TECHNETIUM TC 99M SESTAMIBI GENERIC - CARDIOLITE
10.0000 | Freq: Once | INTRAVENOUS | Status: AC | PRN
  Administered 2012-10-07: 10 via INTRAVENOUS

## 2012-10-07 NOTE — Procedures (Addendum)
 [facility] [address] [place] [address] [place] [address] [phone]  Cardiology Nuclear Med Study  [address] [name]. is a 78 y.o. male     MRN : [id number]     DOB: Feb 04, 1935  Procedure Date: 10/07/2012  Nuclear Med Background Indication for Stress Test:  Graft Patency History:  CAD, MI 1994, CABG x5 1995, Pacer Cardiac Risk Factors: Family History - CAD, History of Smoking, Hypertension, Lipids and Obesity  Symptoms:  Chest Pain, DOE and SOB   Nuclear Pre-Procedure Caffeine/Decaff Intake:  7:00pm NPO After: 5:00am   IV Site: R Hand  IV 0.9% NS with Angio Cath:  22g  Chest Size (in):  44 IV Started by: Emmit Pomfret, RN  Height: 5\' 7"  (1.702 m)  Cup Size: n/a  BMI:  Body mass index is 31.63 kg/(m^2). Weight:  202 lb (91.627 kg)   Tech Comments:  n/a    Nuclear Med Study 1 or 2 day study: 1 day  Stress Test Type:  Lexiscan  Order Authorizing Provider:  Susa Griffins, MD   Resting Radionuclide: Technetium 40m Sestamibi  Resting Radionuclide Dose: 10.2 mCi   Stress Radionuclide:  Technetium 55m Sestamibi  Stress Radionuclide Dose: 29.0 mCi           Stress Protocol Rest HR: 55 Stress HR: 65  Rest BP: 142/76 Stress BP: 146/79  Exercise Time (min): n/a METS: n/a   Predicted Max HR: 144 bpm % Max HR: 45.14 bpm Rate Pressure Product: 9490  Dose of Adenosine (mg):  n/a Dose of Lexiscan: 0.4 mg  Dose of Atropine (mg): n/a Dose of Dobutamine: n/a mcg/kg/min (at max HR)  Stress Test Technologist: Esperanza Sheets, CCT Nuclear Technologist: Koren Shiver, CNMT   Rest Procedure:  Myocardial perfusion imaging was performed at rest 45 minutes following the intravenous administration of Technetium 35m Sestamibi. Stress Procedure:  The patient received IV Lexiscan 0.4 mg over 15-seconds.  Technetium 15m Sestamibi injected at 30-seconds.  There were no significant changes with Lexiscan.  Quantitative spect images were obtained  after a 45 minute delay.  Transient Ischemic Dilatation (Normal <1.22):  0.97 Lung/Heart Ratio (Normal <0.45):  0.27 QGS EDV:  102 ml QGS ESV:  44 ml LV Ejection Fraction: 56%  Signed by      Rest ECG: A-Paced Rhythm  Stress ECG: No significant change from baseline ECG  QPS Raw Data Images:  Normal; no motion artifact; normal heart/lung ratio. Stress Images:  Normal homogeneous uptake in all areas of the myocardium. Rest Images:  Normal homogeneous uptake in all areas of the myocardium. Subtraction (SDS):  No evidence of ischemia.  Impression Exercise Capacity:  Lexiscan with no exercise. BP Response:  Normal blood pressure response. Clinical Symptoms:  No significant symptoms noted. ECG Impression:  No significant ST segment change suggestive of ischemia. Comparison with Prior Nuclear Study: No significant change from previous study  Overall Impression:  Normal stress nuclear study.  LV Wall Motion:  NL LV Function; NL Wall Motion   Runell Gess, MD  10/07/2012 6:13 PM

## 2012-10-15 ENCOUNTER — Encounter: Payer: Self-pay | Admitting: Cardiovascular Disease

## 2012-11-08 ENCOUNTER — Other Ambulatory Visit: Payer: Self-pay

## 2013-01-07 ENCOUNTER — Telehealth: Payer: Self-pay | Admitting: *Deleted

## 2013-01-07 NOTE — Telephone Encounter (Signed)
Faxed surgical clearance back to have facial surgery.

## 2013-01-14 ENCOUNTER — Telehealth: Payer: Self-pay | Admitting: Internal Medicine

## 2013-01-14 NOTE — Telephone Encounter (Signed)
01-14-13 LMM @1220PM  TO SET UP FU DEVICE APPT WITH [name] [facility] PT, OFFERED 01-20-13/MT

## 2013-01-18 ENCOUNTER — Encounter: Payer: Self-pay | Admitting: *Deleted

## 2013-01-19 ENCOUNTER — Other Ambulatory Visit: Payer: Self-pay | Admitting: *Deleted

## 2013-01-19 NOTE — Telephone Encounter (Signed)
Refills request refused - patient has NP appmt with Odessa Fleming, MD on 01/20/13

## 2013-01-20 ENCOUNTER — Ambulatory Visit (INDEPENDENT_AMBULATORY_CARE_PROVIDER_SITE_OTHER): Payer: PRIVATE HEALTH INSURANCE | Admitting: Internal Medicine

## 2013-01-20 ENCOUNTER — Telehealth: Payer: Self-pay | Admitting: Internal Medicine

## 2013-01-20 ENCOUNTER — Encounter: Payer: Self-pay | Admitting: Internal Medicine

## 2013-01-20 ENCOUNTER — Encounter (INDEPENDENT_AMBULATORY_CARE_PROVIDER_SITE_OTHER): Payer: Self-pay

## 2013-01-20 VITALS — BP 129/76 | HR 63 | Ht 67.0 in | Wt 205.0 lb

## 2013-01-20 DIAGNOSIS — I495 Sick sinus syndrome: Secondary | ICD-10-CM

## 2013-01-20 DIAGNOSIS — I4891 Unspecified atrial fibrillation: Secondary | ICD-10-CM

## 2013-01-20 DIAGNOSIS — I119 Hypertensive heart disease without heart failure: Secondary | ICD-10-CM | POA: Insufficient documentation

## 2013-01-20 DIAGNOSIS — Z950 Presence of cardiac pacemaker: Secondary | ICD-10-CM

## 2013-01-20 DIAGNOSIS — Z45018 Encounter for adjustment and management of other part of cardiac pacemaker: Secondary | ICD-10-CM | POA: Insufficient documentation

## 2013-01-20 DIAGNOSIS — R55 Syncope and collapse: Secondary | ICD-10-CM

## 2013-01-20 DIAGNOSIS — I480 Paroxysmal atrial fibrillation: Secondary | ICD-10-CM | POA: Insufficient documentation

## 2013-01-20 DIAGNOSIS — I2510 Atherosclerotic heart disease of native coronary artery without angina pectoris: Secondary | ICD-10-CM | POA: Insufficient documentation

## 2013-01-20 HISTORY — DX: Sick sinus syndrome: I49.5

## 2013-01-20 LAB — CBC WITH DIFFERENTIAL/PLATELET
Basophils Absolute: 0 10*3/uL (ref 0.0–0.1)
Eosinophils Relative: 1.5 % (ref 0.0–5.0)
HCT: 41.4 % (ref 39.0–52.0)
Hemoglobin: 14.4 g/dL (ref 13.0–17.0)
Lymphocytes Relative: 32.8 % (ref 12.0–46.0)
Lymphs Abs: 1.5 10*3/uL (ref 0.7–4.0)
Monocytes Relative: 13.1 % — ABNORMAL HIGH (ref 3.0–12.0)
Neutro Abs: 2.4 10*3/uL (ref 1.4–7.7)
RBC: 4.34 Mil/uL (ref 4.22–5.81)
RDW: 13.1 % (ref 11.5–14.6)
WBC: 4.6 10*3/uL (ref 4.5–10.5)

## 2013-01-20 LAB — TSH: TSH: 3.2 u[IU]/mL (ref 0.35–5.50)

## 2013-01-20 LAB — BASIC METABOLIC PANEL
BUN: 22 mg/dL (ref 6–23)
CO2: 29 mEq/L (ref 19–32)
Calcium: 9.5 mg/dL (ref 8.4–10.5)
Creatinine, Ser: 1.1 mg/dL (ref 0.4–1.5)
GFR: 69.76 mL/min (ref >60.00)
Glucose, Bld: 92 mg/dL (ref 70–99)
Sodium: 136 mEq/L (ref 135–145)

## 2013-01-20 LAB — LIPID PANEL: Total CHOL/HDL Ratio: 3

## 2013-01-20 MED ORDER — TEMAZEPAM 30 MG PO CAPS: 30.0000 mg | ORAL_CAPSULE | Freq: Every evening | ORAL | Status: DC | PRN

## 2013-01-20 MED ORDER — SIMVASTATIN 80 MG PO TABS: 80.0000 mg | ORAL_TABLET | Freq: Every day | ORAL | Status: DC

## 2013-01-20 MED ORDER — RAMIPRIL 5 MG PO CAPS: 5.0000 mg | ORAL_CAPSULE | Freq: Every day | ORAL | Status: DC

## 2013-01-20 MED ORDER — NABUMETONE 500 MG PO TABS: 500.0000 mg | ORAL_TABLET | Freq: Two times a day (BID) | ORAL | Status: DC

## 2013-01-20 MED ORDER — TRIAMTERENE-HCTZ 37.5-25 MG PO CAPS: 1.0000 | ORAL_CAPSULE | Freq: Every day | ORAL | Status: DC

## 2013-01-20 MED ORDER — OMEPRAZOLE 20 MG PO CPDR: 20.0000 mg | DELAYED_RELEASE_CAPSULE | Freq: Every day | ORAL | Status: DC

## 2013-01-20 MED ORDER — METOPROLOL TARTRATE 25 MG PO TABS: ORAL_TABLET | ORAL | Status: DC

## 2013-01-20 NOTE — Assessment & Plan Note (Signed)
The device was interrogated. It was noted that there was an inappropriate atrial capture confirms. IS inactivated.

## 2013-01-20 NOTE — Assessment & Plan Note (Signed)
Well controlled. We will check his electrolytes and potassium today.

## 2013-01-20 NOTE — Patient Instructions (Addendum)
Your physician recommends that you have lab work today: BMET/Lipids/TSH/CBCD  Remote monitoring is used to monitor your Pacemaker of ICD from home. This monitoring reduces the number of office visits required to check your device to one time per year. It allows [place] to keep an eye on the functioning of your device to ensure it is working properly. You are scheduled for a device check from home on 04/25/2013. You may send your transmission at any time that day. If you have a wireless device, the transmission will be sent automatically. After your physician reviews your transmission, you will receive a postcard with your next transmission date.  Your physician wants you to follow-up in: one year with Dr. Graciela Husbands. You will receive a reminder letter in the mail two months in advance. If you don't receive a letter, please call our office to schedule the follow-up appointment.

## 2013-01-20 NOTE — Assessment & Plan Note (Signed)
No recurrent syncope 

## 2013-01-20 NOTE — Progress Notes (Signed)
ELECTROPHYSIOLOGY CONSULT NOTE  Patient ID: [name], MRN: [id number], DOB/AGE: 1934/05/19 78 y.o. Admit date: (Not on file) Date of Consult: 01/20/2013  Primary Physician: Kaleen Mask, MD Primary Cardiologist:  RAW Chief Complaint:     HPI [name] is a 78 y.o. male seen to establish pacemaker followup.Pacemaker implantation was 1999 with generator replacement 2011. He has a [employer] device originally implanted with a diagnosis of sick sinus syndrome including atrial fibrillation.  He is a retired Scientist, water quality.  He has a history of coronary artery disease with bypass grafting 1997. His remote MI. He underwent Myoview scanning 7/14 demonstrating ejection fraction of 56% and without evidence of ischemia.  The patient denies chest pain, shortness of breath, nocturnal dyspnea, orthopnea or peripheral edema.  There have been no palpitations, lightheadedness or syncope.   Old charts reviewed   He has a known AAA. Followup has been scheduled  Past Medical History  Diagnosis Date  . CAD (coronary artery disease)     Last cath 02/2002 occluded VG-RCA, other grafts patent, EF 50% last Nuc 02/01/2009, CABG 1997  . PAF (paroxysmal atrial fibrillation)   . Syncope 11/28/97  . AAA (abdominal aortic aneurysm)     Llast aortic ultrasound 12/16/11 3.51 X 3.63  . OSA on CPAP   . H/O prostate cancer   . Aortic stenosis, mild     Last echo 05/04/12 +LVH  . Back pain     hx epidural injections  . Acne rosacea   . H/O: GI bleed     mild, neg. colonoscopy  . Hyperlipidemia   . Thrombocytopenia     chronic, mild  . GERD (gastroesophageal reflux disease)   . COPD (chronic obstructive pulmonary disease)   . HTN (hypertension)   . Baker's cyst     Lt.  [name] PVD (peripheral vascular disease)     rt ext iliac stenosis, last PV cath 2003, moderate stenosis last Lower Ext Dopplers 12/16/11, Rt. ABI 0.96  Lt ABI 1.0  . Pacemaker -[address] Judes     DOI 1999 with change out 2006  . Sinus  node dysfunction 01/20/2013      Surgical History:  Past Surgical History  Procedure Laterality Date  . Coronary artery bypass graft  1997    LIMA-LAD; VG-diag; seq VG-1st OM & distal LCX; VG-RCA  . Pacemaker insertion  11/28/97    pacesetter--ERI 2011  . Pacemaker generator change  08/15/2009    St. Jude accent--last checked2/08/2012  . Suprapubic prostatectomy    . Cardiac catheterization  2003    VG to RCA occluded, other grafts patent  . Coronary angioplasty  03/29/92    PTCA to LCX     Home Meds: Prior to Admission medications   Medication Sig Start Date End Date Taking? Authorizing Provider  albuterol-ipratropium (COMBIVENT) 18-103 MCG/ACT inhaler Inhale 1 puff into the lungs 2 (two) times daily.    Historical Provider, MD  metoprolol tartrate (LOPRESSOR) 25 MG tablet Take 25 mg in AM (whole 25 mg tab) and 12.5 mg (half 25 mg Tab) in pm    Historical Provider, MD  nabumetone (RELAFEN) 500 MG tablet Take 500 mg by mouth 2 (two) times daily.    Historical Provider, MD  omeprazole (PRILOSEC) 20 MG capsule Take 1 capsule (20 mg total) by mouth daily. 07/27/12   Abelino Derrick, PA-C  ramipril (ALTACE) 5 MG capsule Take 5 mg by mouth daily.    Historical Provider, MD  simvastatin (ZOCOR) 80 MG tablet  Take 80 mg by mouth at bedtime.    Historical Provider, MD  triamterene-hydrochlorothiazide (DYAZIDE) 37.5-25 MG per capsule Take 1 capsule by mouth daily.    Historical Provider, MD       Allergies: No Known Allergies  History   Social History  . Marital Status: Widowed    Spouse Name: N/A    Number of Children: 5  . Years of Education: N/A   Occupational History  . Not on file.   Social History Main Topics  . Smoking status: Former Smoker    Quit date: 03/17/1996  . Smokeless tobacco: Not on file  . Alcohol Use: Yes     Comment: occ  . Drug Use: Not on file  . Sexual Activity: Not on file   Other Topics Concern  . Not on file   Social History Narrative  . No narrative  on file     No family history on file.   ROS:  Please see the history of present illness.     All other systems reviewed and negative.    Physical Exam:   Blood pressure 129/76, pulse 63, height 5\' 7"  (1.702 m), weight 205 lb (92.987 kg). General: Well developed, well nourished male in no acute distress. Head: Normocephalic, atraumatic, sclera non-icteric, no xanthomas, nares are without discharge. EENT: normal Lymph Nodes:  none Back: without scoliosis/kyphosis , no CVA tendersness Neck: Negative for carotid bruits. JVD not elevated. Lungs: Clear bilaterally to auscultation without wheezes, rales, or rhonchi. Breathing is unlabored. Heart: RRR with S1 S2. 2/6 systolic  murmur , rubs, or gallops appreciated. Abdomen: Soft, non-tender, non-distended with normoactive bowel sounds. No hepatomegaly. No rebound/guarding. No obvious abdominal masses. Msk:  Strength and tone appear normal for age. Extremities: No clubbing or cyanosis. No edema.  Distal pedal pulses are 2+ and equal bilaterally. Skin: Warm and Dry Neuro: Alert and oriented X 3. CN III-XII intact Grossly normal sensory and motor function . Psych:  Responds to questions appropriately with a normal affect.      Labs: Cardiac Enzymes No results found for this basename: CKTOTAL, CKMB, TROPONINI,  in the last 72 hours CBC Lab Results  Component Value Date   WBC 3.4* 09/28/2012   HGB 13.1 09/28/2012   HCT 39.2 09/28/2012   MCV 97.3 09/28/2012   PLT 118* 09/28/2012   PROTIME: No results found for this basename: LABPROT, INR,  in the last 72 hours Chemistry No results found for this basename: NA, K, CL, CO2, BUN, CREATININE, CALCIUM, LABALBU, PROT, BILITOT, ALKPHOS, ALT, AST, GLUCOSE,  in the last 168 hours Lipids Lab Results  Component Value Date   CHOL 142 09/28/2012   HDL 35* 09/28/2012   LDLCALC 73 09/28/2012   TRIG 172* 09/28/2012   BNP No results found for this basename: probnp   Miscellaneous Lab Results    Component Value Date   DDIMER  Value: 0.56        AT THE INHOUSE ESTABLISHED CUTOFF VALUE OF 0.48 ug/mL FEU, THIS ASSAY HAS BEEN DOCUMENTED IN THE LITERATURE TO HAVE A SENSITIVITY AND NEGATIVE PREDICTIVE VALUE OF AT LEAST 98 TO 99%.  THE TEST RESULT SHOULD BE CORRELATED WITH AN ASSESSMENT OF THE CLINICAL PROBABILITY OF DVT / VTE.* 10/07/2009    Radiology/Studies:  No results found.     Assessment and Plan: *   Sherryl Manges

## 2013-01-20 NOTE — Assessment & Plan Note (Signed)
Stable without recurrent symptoms Will check lipids today

## 2013-01-20 NOTE — Telephone Encounter (Signed)
New message    Had pacer ck today---told someone he did not have a box to have home checks. Someone in the device clinic was supposed to order him one. However, when he returned home----he found his box.   Please do not order another box.

## 2013-01-25 ENCOUNTER — Other Ambulatory Visit (HOSPITAL_COMMUNITY): Payer: Self-pay | Admitting: Cardiovascular Disease

## 2013-01-28 ENCOUNTER — Other Ambulatory Visit (HOSPITAL_COMMUNITY): Payer: Self-pay | Admitting: Cardiovascular Disease

## 2013-01-28 ENCOUNTER — Ambulatory Visit (HOSPITAL_COMMUNITY)
Admission: RE | Admit: 2013-01-28 | Discharge: 2013-01-28 | Disposition: A | Discharge status: Home / Self Care | Payer: PRIVATE HEALTH INSURANCE | Source: Ambulatory Visit | Attending: Cardiovascular Disease | Admitting: Cardiovascular Disease

## 2013-01-28 ENCOUNTER — Encounter (HOSPITAL_COMMUNITY): Payer: Self-pay | Admitting: *Deleted

## 2013-01-28 ENCOUNTER — Encounter: Payer: Self-pay | Admitting: Cardiovascular Disease

## 2013-01-28 DIAGNOSIS — I714 Abdominal aortic aneurysm, without rupture: Secondary | ICD-10-CM

## 2013-01-28 DIAGNOSIS — I6529 Occlusion and stenosis of unspecified carotid artery: Secondary | ICD-10-CM | POA: Insufficient documentation

## 2013-01-28 NOTE — Progress Notes (Signed)
Carotid Duplex Completed. °Brianna L Mazza,RVT °

## 2013-01-31 LAB — MDC_IDC_ENUM_SESS_TYPE_INCLINIC
Implantable Pulse Generator Model: 2210
Implantable Pulse Generator Serial Number: 7130714
Lead Channel Impedance Value: 510 Ohm
Lead Channel Pacing Threshold Amplitude: 0.5 V
Lead Channel Pacing Threshold Amplitude: 1.5 V
Lead Channel Sensing Intrinsic Amplitude: 0.6 mV
Lead Channel Sensing Intrinsic Amplitude: 12 mV
Lead Channel Setting Pacing Pulse Width: 0.7 ms
Lead Channel Setting Sensing Sensitivity: 2 mV

## 2013-02-01 ENCOUNTER — Encounter: Payer: Self-pay | Admitting: Cardiovascular Disease

## 2013-02-02 ENCOUNTER — Ambulatory Visit (HOSPITAL_COMMUNITY)
Admission: RE | Admit: 2013-02-02 | Discharge: 2013-02-02 | Disposition: A | Discharge status: Home / Self Care | Payer: PRIVATE HEALTH INSURANCE | Source: Ambulatory Visit | Attending: Cardiovascular Disease | Admitting: Cardiovascular Disease

## 2013-02-02 DIAGNOSIS — I714 Abdominal aortic aneurysm, without rupture, unspecified: Secondary | ICD-10-CM | POA: Insufficient documentation

## 2013-02-02 NOTE — Progress Notes (Signed)
Abdominal Aortic Duplex Completed °Brianna L Mazza,RVT °

## 2013-02-08 NOTE — Progress Notes (Signed)
LM w/abd aorta duplex results. Slight increase in size 3.5 to 3.8cm.

## 2013-02-23 ENCOUNTER — Telehealth: Payer: Self-pay | Admitting: *Deleted

## 2013-02-23 DIAGNOSIS — I714 Abdominal aortic aneurysm, without rupture, unspecified: Secondary | ICD-10-CM

## 2013-02-23 NOTE — Telephone Encounter (Signed)
Order placed for yearly abd doppler study.

## 2013-02-23 NOTE — Telephone Encounter (Signed)
Message copied by Vita Barley on Wed Feb 23, 2013  9:23 AM ------      Message from: Thurmon Fair      Created: Wed Feb 02, 2013  2:54 PM       His AAA is still relatively small at 3.8 cm, but has grown very slightly - need to continue yearly monitoring. Schedule repeat study in 12 months please ------

## 2013-02-24 ENCOUNTER — Other Ambulatory Visit: Payer: Self-pay

## 2013-02-24 MED ORDER — TRIAMTERENE-HCTZ 37.5-25 MG PO CAPS: ORAL_CAPSULE | ORAL | Status: DC

## 2013-02-24 NOTE — Telephone Encounter (Signed)
Rx was sent to pharmacy electronically. 

## 2013-03-25 ENCOUNTER — Other Ambulatory Visit: Payer: Self-pay | Admitting: *Deleted

## 2013-04-11 ENCOUNTER — Telehealth: Payer: Self-pay | Admitting: *Deleted

## 2013-04-11 NOTE — Telephone Encounter (Signed)
Message copied by Fernande Boyden on Mon Apr 11, 2013  8:36 AM ------      Message from: Deboraha Sprang      Created: Sat Apr 09, 2013 10:57 AM      Regarding: RE: refill on medication       i didn't order this and he will need to get refilled by whoever did as i am not familiiar with it      ----- Message -----         From: Fernande Boyden, RN         Sent: 04/07/2013   1:33 PM           To: Deboraha Sprang, MD      Subject: refill on medication                                     His pharmacy has requested refill on nabumetone 500 mg one tablet by mouth 2 times daily, thanks, Lovett Sox, RN       ------

## 2013-04-25 ENCOUNTER — Ambulatory Visit (INDEPENDENT_AMBULATORY_CARE_PROVIDER_SITE_OTHER): Payer: PRIVATE HEALTH INSURANCE | Admitting: *Deleted

## 2013-04-25 DIAGNOSIS — I495 Sick sinus syndrome: Secondary | ICD-10-CM

## 2013-05-04 LAB — MDC_IDC_ENUM_SESS_TYPE_REMOTE
Battery Remaining Longevity: 94 mo
Battery Voltage: 2.95 V
Brady Statistic AP VP Percent: 1 %
Brady Statistic AS VP Percent: 1 %
Brady Statistic RA Percent Paced: 12 %
Brady Statistic RV Percent Paced: 1 %
Implantable Pulse Generator Model: 2210
Implantable Pulse Generator Serial Number: 7130714
Lead Channel Impedance Value: 360 Ohm
Lead Channel Pacing Threshold Amplitude: 0.5 V
Lead Channel Pacing Threshold Pulse Width: 0.4 ms
Lead Channel Sensing Intrinsic Amplitude: 1.5 mV
MDC IDC MSMT LEADCHNL RV IMPEDANCE VALUE: 530 Ohm
MDC IDC MSMT LEADCHNL RV PACING THRESHOLD AMPLITUDE: 1.375 V
MDC IDC MSMT LEADCHNL RV PACING THRESHOLD PULSEWIDTH: 0.7 ms
MDC IDC MSMT LEADCHNL RV SENSING INTR AMPL: 12 mV
MDC IDC SESS DTM: 20150209081500
MDC IDC SET LEADCHNL RA PACING AMPLITUDE: 2 V
MDC IDC SET LEADCHNL RV PACING AMPLITUDE: 1.625
MDC IDC SET LEADCHNL RV PACING PULSEWIDTH: 0.7 ms
MDC IDC SET LEADCHNL RV SENSING SENSITIVITY: 2 mV
MDC IDC STAT BRADY AP VS PERCENT: 12 %
MDC IDC STAT BRADY AS VS PERCENT: 88 %

## 2013-05-11 ENCOUNTER — Encounter: Payer: Self-pay | Admitting: *Deleted

## 2013-05-13 ENCOUNTER — Other Ambulatory Visit: Payer: Self-pay | Admitting: Internal Medicine

## 2013-05-18 ENCOUNTER — Other Ambulatory Visit: Payer: Self-pay

## 2013-05-18 MED ORDER — OMEPRAZOLE 20 MG PO CPDR: 20.0000 mg | DELAYED_RELEASE_CAPSULE | Freq: Every day | ORAL | Status: DC

## 2013-05-18 MED ORDER — SIMVASTATIN 80 MG PO TABS: 80.0000 mg | ORAL_TABLET | Freq: Every day | ORAL | Status: DC

## 2013-05-18 MED ORDER — METOPROLOL TARTRATE 25 MG PO TABS: ORAL_TABLET | ORAL | Status: DC

## 2013-05-18 MED ORDER — SIMVASTATIN 80 MG PO TABS
80.0000 mg | ORAL_TABLET | Freq: Every day | ORAL | Status: DC
Start: 2013-05-18 — End: 2013-05-18

## 2013-05-30 ENCOUNTER — Encounter: Payer: Self-pay | Admitting: Internal Medicine

## 2013-06-01 ENCOUNTER — Encounter: Payer: Self-pay | Admitting: Cardiology

## 2013-07-05 ENCOUNTER — Emergency Department (HOSPITAL_COMMUNITY): Payer: PRIVATE HEALTH INSURANCE

## 2013-07-05 ENCOUNTER — Encounter (HOSPITAL_COMMUNITY): Payer: Self-pay | Admitting: Emergency Medicine

## 2013-07-05 ENCOUNTER — Telehealth: Payer: Self-pay | Admitting: *Deleted

## 2013-07-05 ENCOUNTER — Emergency Department (HOSPITAL_COMMUNITY)
Admission: EM | Admit: 2013-07-05 | Discharge: 2013-07-05 | Disposition: A | Discharge status: Home / Self Care | Payer: PRIVATE HEALTH INSURANCE | Attending: Emergency Medicine | Admitting: Emergency Medicine

## 2013-07-05 ENCOUNTER — Other Ambulatory Visit: Payer: Self-pay | Admitting: Physician Assistant

## 2013-07-05 ENCOUNTER — Ambulatory Visit: Payer: PRIVATE HEALTH INSURANCE | Admitting: Cardiology

## 2013-07-05 DIAGNOSIS — R079 Chest pain, unspecified: Secondary | ICD-10-CM

## 2013-07-05 DIAGNOSIS — I4891 Unspecified atrial fibrillation: Secondary | ICD-10-CM | POA: Diagnosis not present

## 2013-07-05 DIAGNOSIS — I119 Hypertensive heart disease without heart failure: Secondary | ICD-10-CM

## 2013-07-05 DIAGNOSIS — Z872 Personal history of diseases of the skin and subcutaneous tissue: Secondary | ICD-10-CM | POA: Diagnosis not present

## 2013-07-05 DIAGNOSIS — Z9889 Other specified postprocedural states: Secondary | ICD-10-CM | POA: Diagnosis not present

## 2013-07-05 DIAGNOSIS — Z8739 Personal history of other diseases of the musculoskeletal system and connective tissue: Secondary | ICD-10-CM | POA: Insufficient documentation

## 2013-07-05 DIAGNOSIS — I10 Essential (primary) hypertension: Secondary | ICD-10-CM | POA: Insufficient documentation

## 2013-07-05 DIAGNOSIS — J449 Chronic obstructive pulmonary disease, unspecified: Secondary | ICD-10-CM | POA: Insufficient documentation

## 2013-07-05 DIAGNOSIS — Z951 Presence of aortocoronary bypass graft: Secondary | ICD-10-CM | POA: Insufficient documentation

## 2013-07-05 DIAGNOSIS — IMO0002 Reserved for concepts with insufficient information to code with codable children: Secondary | ICD-10-CM | POA: Diagnosis not present

## 2013-07-05 DIAGNOSIS — G4733 Obstructive sleep apnea (adult) (pediatric): Secondary | ICD-10-CM | POA: Diagnosis not present

## 2013-07-05 DIAGNOSIS — Z87891 Personal history of nicotine dependence: Secondary | ICD-10-CM | POA: Insufficient documentation

## 2013-07-05 DIAGNOSIS — E785 Hyperlipidemia, unspecified: Secondary | ICD-10-CM | POA: Insufficient documentation

## 2013-07-05 DIAGNOSIS — I200 Unstable angina: Secondary | ICD-10-CM | POA: Diagnosis not present

## 2013-07-05 DIAGNOSIS — Z7982 Long term (current) use of aspirin: Secondary | ICD-10-CM | POA: Diagnosis not present

## 2013-07-05 DIAGNOSIS — Z79899 Other long term (current) drug therapy: Secondary | ICD-10-CM | POA: Insufficient documentation

## 2013-07-05 DIAGNOSIS — Z862 Personal history of diseases of the blood and blood-forming organs and certain disorders involving the immune mechanism: Secondary | ICD-10-CM | POA: Diagnosis not present

## 2013-07-05 DIAGNOSIS — K219 Gastro-esophageal reflux disease without esophagitis: Secondary | ICD-10-CM | POA: Diagnosis not present

## 2013-07-05 DIAGNOSIS — Z8546 Personal history of malignant neoplasm of prostate: Secondary | ICD-10-CM | POA: Insufficient documentation

## 2013-07-05 DIAGNOSIS — Z45018 Encounter for adjustment and management of other part of cardiac pacemaker: Secondary | ICD-10-CM | POA: Diagnosis not present

## 2013-07-05 DIAGNOSIS — I350 Nonrheumatic aortic (valve) stenosis: Secondary | ICD-10-CM | POA: Diagnosis present

## 2013-07-05 DIAGNOSIS — R42 Dizziness and giddiness: Secondary | ICD-10-CM | POA: Insufficient documentation

## 2013-07-05 DIAGNOSIS — I2510 Atherosclerotic heart disease of native coronary artery without angina pectoris: Secondary | ICD-10-CM | POA: Diagnosis not present

## 2013-07-05 DIAGNOSIS — I480 Paroxysmal atrial fibrillation: Secondary | ICD-10-CM

## 2013-07-05 DIAGNOSIS — J4489 Other specified chronic obstructive pulmonary disease: Secondary | ICD-10-CM | POA: Insufficient documentation

## 2013-07-05 HISTORY — DX: Peripheral vascular disease, unspecified: I73.9

## 2013-07-05 HISTORY — DX: Endocarditis, valve unspecified: I38

## 2013-07-05 HISTORY — DX: Sick sinus syndrome: I49.5

## 2013-07-05 HISTORY — DX: Disorder of arteries and arterioles, unspecified: I77.9

## 2013-07-05 HISTORY — DX: Hypertensive heart disease without heart failure: I11.9

## 2013-07-05 LAB — CBC
HEMATOCRIT: 36.8 % — AB (ref 39.0–52.0)
Hemoglobin: 12.7 g/dL — ABNORMAL LOW (ref 13.0–17.0)
MCH: 32.9 pg (ref 26.0–34.0)
MCHC: 34.5 g/dL (ref 30.0–36.0)
MCV: 95.3 fL (ref 78.0–100.0)
Platelets: 102 10*3/uL — ABNORMAL LOW (ref 150–400)
RBC: 3.86 MIL/uL — AB (ref 4.22–5.81)
RDW: 12.6 % (ref 11.5–15.5)
WBC: 3.8 10*3/uL — AB (ref 4.0–10.5)

## 2013-07-05 LAB — BASIC METABOLIC PANEL
BUN: 18 mg/dL (ref 6–23)
CALCIUM: 9.6 mg/dL (ref 8.4–10.5)
CO2: 25 mEq/L (ref 19–32)
Chloride: 103 mEq/L (ref 96–112)
Creatinine, Ser: 0.93 mg/dL (ref 0.50–1.35)
GFR, EST NON AFRICAN AMERICAN: 79 mL/min — AB (ref >90)
Glucose, Bld: 85 mg/dL (ref 70–99)
POTASSIUM: 4.8 meq/L (ref 3.7–5.3)
SODIUM: 138 meq/L (ref 137–147)

## 2013-07-05 LAB — I-STAT TROPONIN, ED: Troponin i, poc: 0.01 ng/mL (ref 0.00–0.08)

## 2013-07-05 MED ORDER — MECLIZINE HCL 25 MG PO TABS: 25.0000 mg | ORAL_TABLET | Freq: Two times a day (BID) | ORAL | Status: DC | PRN

## 2013-07-05 MED ORDER — METOPROLOL TARTRATE 25 MG PO TABS: 12.5000 mg | ORAL_TABLET | Freq: Two times a day (BID) | ORAL | Status: DC

## 2013-07-05 NOTE — Consult Note (Signed)
History and Physical  Patient ID: [name] MRN: [id number], DOB: 09/01/1934 Date of Encounter: 07/05/2013, 4:33 PM Primary Physician: [name], MD Primary Cardiologist: Previously Rollene Fare, more recently has been followed by Dr. [name] for pacemaker  Chief Complaint: dizziness Reason for Admission: dizziness  HPI: [name] is a 78 y/o M with history of CAD s/p CABG 1997 (last cath 2003 with occluded SVG-RCA, last nuc 09/2012 was normal), remote PAF per record, SSS/syncope s/p St. Jude pacemaker (last gen change 2011), 3.8cm AAA by [place] 01/2013, asymptomatic right iliac stenosis, mild carotid plaque by duplex 01/2013, and valvular heart disease (mild-mod AS, mild AI, mild MR by echo 04/2012) who presents to [facility] with 3 week history of dizziness. This has been a daily problem for him. He says it's sometimes there all the time, but decreases when laying down and increases when sitting up, standing up, or walking. It feels like a "swimmy headed" sensation. It is not worse when he turns his head a certain way. He's had a mild headache for 3 weeks and chronic unchanged DOE. He has occasional atypical chest pain approximately once a week lasting 1-2 minutes maximum, occuring mostly after he eats things with hamburger in them. He sometimes takes NTG for this. This is the same discomfort he has had for 8 years without any recent change in pattern. He exercises on a treadmill for 1-1.5 miles just about every other day and has able to continue doing so even this week without any limitations of SOB, CP, or particular worsening of dizziness. He does have occasional floaters in his vision. He saw his PCP a few weeks ago who made some blood pressure medicine changes (stopped one medicine he can't recall the name of - possibly ramipril - and decreased Lopressor to once daily) but this did not make any difference. In the ER he continues to feel somewhat dizzy. Pacer interrogation showed he is  pacing 16% of the time, normal function, and 2 brief AF episodes 4 seconds and 14 seconds each - one in April and one in March.  Orthostatics taken by me - 150/71, pulse 57 sitting --> 159/71, pulse 59 standing at 3 minutes thus negative. He did experience symptomatic increase in dizziness with standing but had also felt this while sitting. Romberg was also abnormal. Troponin negative. Mild pancytopenia - borderline low WBC and plt appear chronic. He denies any bleeding, syncope, nausea, vomiting, focal weakness, weight changes, fever, or any other unusual symptoms.  Past Medical History  Diagnosis Date  . CAD (coronary artery disease)     a. s/p MI/PTCA - Cx 1994. b. s/p CABG x5 in 1997. c. Abnl nuc 2003- occ SVG-RCA, patent seq SVG-OM1-dCxOM, patentl LIMA-LAD, patent, SVG-diag. d. Normal nuc 09/2012.  [name] PAF (paroxysmal atrial fibrillation)   . Syncope 11/28/97  . AAA (abdominal aortic aneurysm)     a. Last duplex - 3.8cm 01/2013 - due 01/2014.  . OSA on CPAP   . H/O prostate cancer     a. s/p radical retropubic prostatectomy with bilateral pelvic lymph node dissection  . Aortic stenosis, mild     Last echo 05/04/12 +LVH  . Back pain     hx epidural injections  . Acne rosacea   . H/O: GI bleed     mild, neg. colonoscopy  . Hyperlipidemia   . Thrombocytopenia     chronic, mild  . GERD (gastroesophageal reflux disease)   . COPD (chronic obstructive pulmonary disease)   .  HTN (hypertension)   . Baker's cyst     Lt.  [name] PVD (peripheral vascular disease)     a. Rt ext iliac stenosis, last PV cath 2003, moderate stenosis last Lower Ext Dopplers 12/16/11 - Rt. ABI 0.96  Lt ABI 1.0.  . Pacemaker -[place] with change out 2006  . Sinus node dysfunction 01/20/2013  . Hypertensive heart disease   . Sick sinus syndrome     a. placed 1999, gen change 2011 - St. Jude.     Most Recent Cardiac Studies: 2D Echo 05/04/12 - Left ventricle: The cavity size was mildly dilated.  Wall thickness was increased in a pattern of mild LVH. Mild hypokinesis of the inferior myocardium. Left ventricular diastolic function parameters were normal. - Aortic valve: There was mild to moderate stenosis. Mild regurgitation. Valve area: 1.06cm^2(VTI). Valve area: 1.13cm^2 (Vmax). - Mitral valve: Calcified annulus. Mild regurgitation. Valve area by pressure half-time: 1.57cm^2. Valve area by continuity equation (using LVOT flow): 1.43cm^2. - Left atrium: The atrium was mildly dilated. - Right atrium: The atrium was mildly dilated. - Atrial septum: No defect or patent foramen ovale was identified. Impressions:  - Atrial paced rhythm with intact A-V CONDUCTION. Moderate calcific AS with well preserved EF.  Nuc 09/2012 Impression  Exercise Capacity: Lexiscan with no exercise.  BP Response: Normal blood pressure response.  Clinical Symptoms: No significant symptoms noted.  ECG Impression: No significant ST segment change suggestive of ischemia.  Comparison with Prior Nuclear Study: No significant change from previous study  Overall Impression: Normal stress nuclear study.  LV Wall Motion: NL LV Function; NL Wall Motion  Cath 2003  CARDIAC CATHETERIZATION  PROCEDURES: Retrograde central aortic catheterization, selective coronary  angiography by Judkins technique, saphenous vein graft angiography,  subselective - selective left internal mammary artery injection, left  ventricular angiogram RAO, LAO projection, abdominal aortic angiogram mid  stream and PA projection, right iliac angiogram hand injection.  DESCRIPTION OF PROCEDURE: The patient was hydrated preoperatively in the  postabsorptive state and had normal preoperative laboratory with admission  as an outpatient and creatinine at 0.6. He was brought to the second floor  CP lab in the postabsorptive state after 5 mg of Valium p.o. premedication.  The right groin was prepped, draped in the usual manner. Then 1% Xylocaine   was used for local anesthesia, and the RFA was entered with a single  anterior puncture using an 18 thin wall needle. A 6 French short Daig side-  arm sheath was inserted without difficulty. Catheterization was performed  using guide wire exchange because of the patient's known iliac stenosis and  infrarenal abdominal aneurysm. A Cordis 6 French 4 cm taper preformed right  and left coronary catheters were used for selective coronary angiography.  The right coronary catheter was used for selective saphenous vein graft  angiography. The subselective LIMA was done with the right coronary  catheter. We then used a Cordis and a Scimed IMA catheter but was not able  to get selective injections. We were also able to get only subselective  injections with a VB-1 catheter. There was, however, good visualization  antegrade and there was good retrograde visualization through the native  coronary arteries to see the LIMA anastomosis and distal LAD. LV angiogram  was done in the RAO and LAO projection at 25 cc 14 cc/sec. and 20 cc 12  cc/sec. through a 6 French angled pigtail Cordis catheter. Pullback  pressure of the CA was  performed and showed no gradient across the aortic  valve. Abdominal aortic angiogram was done in the mid stream PA projection  above the level of the renal arteries at 25 cc 20 cc/sec. with runoff to the  right iliac region. Right iliac injection was done through the side-arm  sheath by hand injection. The catheter was removed, side-arm sheath was  flushed and the patient was brought to the holding area for sheath removal  and pressure hemostasis. He tolerated the procedure well. He had good  urine output throughout the procedure.  PRESSURES: LV: 135/0, LVEDP 18 mmHg. CA: 135/65 mmHg. There is no  gradient across the aortic valve on pullback.  Fluoroscopy revealed 2-3+ proximal LAD, circumflex, and right coronary  calcification. There was no significant intracardiac or valvular   calcification seen.  LEFT VENTRICULAR ANGIOGRAM: The RAO and LAO projection displayed focal wall  motion abnormalities of the mid anterolateral wall, the mid to distal third  of the inferior wall and infra-apical segment and posterior apical segment.  Overall, ejection fraction was well preserved. However, at approximately 50%  there is no significant mitral regurgitation present.  ABDOMINAL AORTOGRAPHY: Abdominal aortic angiogram in the mid stream PA  projection shows single and normal-appearing renal arteries bilaterally.  There was a moderate sized infrarenal abdominal aortic aneurysm visualized  with a long normal-appearing infrarenal neck. The aneurysm was  angiographically began above the IMA and extended to the iliac bifurcation.  There was only mild proximal iliac dilatation. The left hypogastric  appeared to have a high-grade stenosis but was present. The right  hypogastric was intact and there was a 60% calcific narrowing of the  proximal REIA. The remainder of the external iliac had no significant  stenosis and the proximal SFA profunda junction was intact. The sheath was  placed in the common femoral artery.  NATIVE CORONARY ANGIOGRAPHY: The main left coronary had approximately 40-  50% narrowing concentrically, but good residual lumen and good antegrade  flow. The proximal LAD had 60% narrowing before the first diagonal branch.  The first diagonal branch had 95% segmental stenosis but there was some  antegrade filling. There was another 70-80% stenosis between the first and  second diagonal branch at the proximal third of the LAD. There was  antegrade filling of the LAD to the apex where it bifurcated and retrograde  filling of the intact LIMA which was anastomosed to just beyond the junction  of the proximal third of the LAD with an excellent anastomotic site and no  significant LAD disease beyond with a good vessel that was slightly  tortuous, smooth, and filled to the  apex.  There was also retrograde filling of the sequential CX, OM - distal CX  graft.  The native circumflex had 50% narrowing beyond the OM-1, 70-80% narrowing  beyond the small OM-2 and before a PAVG branch. There was another 80-90%  narrowing beyond the OM-3 in this codominant circumflex. The OM-2 and OM-3  were not significantly jeopardized. There was good retrograde filling of the  graft beyond the distal circumflex lesion.  Native right coronary artery was patent and showed 50-60% calcific narrowing  at the junction of the proximal third just beyond the bend, 40% narrowing in  the midportion, 40-50% before the acute margin and 50% eccentric narrowing  in the distal third before the PLA, PDA bifurcation after an RV branch.  There was a large RV branch in the proximal third that bifurcated without  significant stenosis and a moderately large  RV branch beyond the crux that  was patent.  Saphenous vein graft to the RCA was occluded at its origin.  Saphenous vein graft sequential to the large OM-1 and distal circumflex was  widely patent and the vein graft was smooth, excellent anastomosis to both  vessels with excellent flow. The OM-1 bifurcated, was fairly large with no  significant stenosis. The distal circumflex marginal - PDA was intact with  no significant stenosis to the second PDA filled well and there was  retrograde filling to the mid circumflex to the graft.  The LIMA was widely patent. Vertebral was antegrade. Excellent anastomosis  to just beyond the junction of the proximal third as outlined above.  Retrograde filling to the native circumflex demonstrated an excellent LIMA  anastomosis and distal LAD.  DISCUSSION: This 7 year old widowed father of five with seven  grandchildren, is a retired Chief Executive Officer. He is a long-term patient of mine. He  suffered a systolic ejection period myocardial infarction March 28, 1992,  and had circumflex PTCA March 30, 1992. Because of  progression of  disease, he underwent multivessel CABG x5 by Dr. Cyndia Bent February 10, 1996.  He has been treated medically since that time and has done well. He has a  known infrarenal abdominal aortic aneurysm, followed as an outpatient, that  is asymptomatic and known right external iliac narrowing without  claudication.  He was recently seen by Dr. Claiborne Billings in my absence for an episode of chest pain  requiring nitroglycerin. It was not clear whether it was GI or cardiac.  Cardiolite was performed on February 17, 2002, and this showed evidence of  anterolateral ischemia and inferoseptal ischemia with EF of 56%. It was  felt to be of medium to high risk. He has not any significant recent chest  pain since his medical therapy since his last doctor's visit.  Catheterization fortunately demonstrates excellent grafts with widely patent  LIMA to the LAD. The saphenous vein graft to the diagonal #1 is widely  patent with an excellent anastomosis to the large diagonal #1 that  bifurcates twice with good filling. The sequential saphenous vein graft to  the large OM-1 and distal circumflex is widely patent as well. The vein  graft to the right is occluded but there is good flow to the native vessel  which does have spotty proximal mid and distal moderate disease. LV function  is well preserved with segmental wall motion abnormalities. I would  recommend continued medical therapy of his coronary disease at this time.  He is at risk of progression of disease, particularly with native right  coronary.  It should be noted that fluoroscopy showed good position of his atrial and  ventricular electrodes from his previously placed PTVP of November 18, 1997,  for symptomatic bradycardia with sinus arrest and recurrent syndrome and  presyncope resolved.  CATHETERIZATION DIAGNOSES:  1. Arteriosclerotic heart disease.  a. Status post systolic ejection period myocardial infarction, March 28, 1992.  b. Status  post percutaneous transluminal coronary angioplasty codominant  circumflex, March 30, 1992.  c. Coronary artery bypass graft x5, February 10, 1996.  2. Recurrent chest pain compatible with angina with abnormal Cardiolite,  February 17, 2002.  3. Occluded saphenous vein graft to right coronary artery.  4. Patent sequential saphenous vein graft to large obtuse marginal #1 -  distal circumflex obtuse marginal.  5. Patent left internal mammary artery to left anterior descending.  6. Patent saphenous vein graft to diagonal.  7. Well preserved left ventricular  function, ejection fraction 50% with  segmental wall motion abnormalities.  8. Right external iliac stenosis, not symptomatic, moderate.  9. Infrarenal abdominal aortic aneurysm - long infrarenal neck and normal  single renal arteries bilaterally.  10. Remote smoker.  11. Hyperlipidemia, on therapy.  12. Systemic hypertension.  13. Arthritis.  Richard A. Rollene Fare, M.D.   Surgical History:  Past Surgical History  Procedure Laterality Date  . Coronary artery bypass graft  1997    LIMA-LAD; VG-diag; seq VG-1st OM & distal LCX; VG-RCA  . Pacemaker insertion  11/28/97    pacesetter--ERI 2011  . Pacemaker generator change  08/15/2009    St. Jude accent  . Suprapubic prostatectomy    . Cardiac catheterization  2003    VG to RCA occluded, other grafts patent  . Coronary angioplasty  03/29/92    PTCA to LCX     Home Meds: Prior to Admission medications   Medication Sig Start Date End Date Taking? Authorizing Provider  aspirin EC 81 MG tablet Take 81 mg by mouth every evening.   Yes Historical Provider, MD  Celecoxib (CELEBREX PO) Take 1 tablet by mouth 2 (two) times daily.   Yes Historical Provider, MD  fluocinonide (LIDEX) 0.05 % external solution Apply 1 application topically 2 (two) times daily.   Yes Historical Provider, MD  hydrocortisone 2.5 % cream Apply 1 application topically 2 (two) times daily as needed (rash).   Yes  Historical Provider, MD  metoprolol tartrate (LOPRESSOR) 25 MG tablet Take 25 mg by mouth every morning.   Yes Historical Provider, MD  nabumetone (RELAFEN) 500 MG tablet Take 1 tablet (500 mg total) by mouth 2 (two) times daily. 01/20/13  Yes Deboraha Sprang, MD  Omega-3 Fatty Acids (FISH OIL) 1200 MG CAPS Take 1,200 mg by mouth every evening.   Yes Historical Provider, MD  simvastatin (ZOCOR) 80 MG tablet Take 1 tablet (80 mg total) by mouth at bedtime. 05/18/13  Yes Deboraha Sprang, MD  traZODone (DESYREL) 150 MG tablet Take by mouth at bedtime.   Yes Historical Provider, MD  triamterene-hydrochlorothiazide (DYAZIDE) 37.5-25 MG per capsule Take 1 capsule by mouth daily - Monday through Friday 02/24/13  Yes Troy Sine, MD  vitamin C (ASCORBIC ACID) 500 MG tablet Take 500 mg by mouth every evening.   Yes Historical Provider, MD  amoxicillin (AMOXIL) 500 MG capsule Take 1,000 mg by mouth 3 (three) times daily.    Historical Provider, MD  ranitidine (ZANTAC) 150 MG tablet Take 150 mg by mouth daily as needed for heartburn.    Historical Provider, MD    Allergies: No Known Allergies  History   Social History  . Marital Status: Widowed    Spouse Name: N/A    Number of Children: 32  . Years of Education: N/A   Occupational History  . Not on file.   Social History Main Topics  . Smoking status: Former Smoker    Quit date: 03/17/1996  . Smokeless tobacco: Not on file  . Alcohol Use: Yes     Comment: occ  . Drug Use: Not on file  . Sexual Activity: Not on file   Other Topics Concern  . Not on file   Social History Narrative  . No narrative on file     Family History  Problem Relation Age of Onset  . CAD      Review of Systems:see above All other systems reviewed and are otherwise negative except as noted above.  Labs:  Lab Results  Component Value Date   WBC 3.8* 07/05/2013   HGB 12.7* 07/05/2013   HCT 36.8* 07/05/2013   MCV 95.3 07/05/2013   PLT 102* 07/05/2013      Recent Labs Lab 07/05/13 1306  NA 138  K 4.8  CL 103  CO2 25  BUN 18  CREATININE 0.93  CALCIUM 9.6  GLUCOSE 85   POC troponin Lab Results  Component Value Date   CHOL 175 01/20/2013   HDL 50.10 01/20/2013   LDLCALC 92 01/20/2013   TRIG 167.0* 01/20/2013     Radiology/Studies:  Dg Chest 2 View 07/05/2013   CLINICAL DATA:  Left-sided chest pain  EXAM: CHEST  2 VIEW  COMPARISON:  CT UROGRAM dated 01/09/2012; DG CHEST 2 VIEW dated 06/26/2009  FINDINGS: There is no focal parenchymal opacity, pleural effusion, or pneumothorax. The heart and mediastinal contours are unremarkable. Prior CABG. Dual lead cardiac pacer.  There is mild thoracic spine spondylosis.  IMPRESSION: No active cardiopulmonary disease.   Electronically Signed   By: Kathreen Devoid   On: 07/05/2013 13:35     EKG: electronic atrial pacemaker/NSR 55bpm, nonspecific T wave changes inferiorly and V5-V6  Physical Exam: Blood pressure 154/84, pulse 55, temperature 98.5 F (36.9 C), temperature source Oral, resp. rate 13, height 5\' 7"  (1.702 m), weight 196 lb (88.905 kg), SpO2 95.00%. General: Well developed, well nourished WM in no acute distress. Head: Normocephalic, atraumatic, sclera non-icteric, no xanthomas, nares are without discharge.  Neck: Faint bilateral carotid bruits, R>L. JVD not elevated. Lungs: Clear bilaterally to auscultation without wheezes, rales, or rhonchi. Breathing is unlabored. Heart: RRR with S1 S2. Musical systolic murmur heard in RUSB, LSB, and apex. No rubs or gallops appreciated. Abdomen: Soft, non-tender, non-distended with normoactive bowel sounds. + abd bruit. No hepatomegaly. No rebound/guarding. No obvious abdominal masses. Msk:  Strength and tone appear normal for age. Extremities: No clubbing or cyanosis. No edema.  Distal pedal pulses are 2+ and equal bilaterally. Neuro: Alert and oriented X 3. No focal deficit. No facial asymmetry. Moves all extremities spontaneously. Abnormal romberg.   Psych:  Responds to questions appropriately with a normal affect.    ASSESSMENT AND PLAN:   1. Dizziness 2. CAD s/p CABG 1997, last cath 2003 with occluded SVG RCA, neg nuc 09/2012 with stable atypical chest pain 3. Valvular heart disease with mild-mod AS, mild AI, mild MR by echo 04/2012 4. Remote PAF/SSS s/p St. Jude pacemaker 5. Mild carotid artery disease by duplex 01/2013 6. AAA, stable at 3.8cm by duplex 01/2013 7. HTN 8. Mild pancytopenia, appears to have had low WBC/PLT in past  Etiology of 3 week history of dizziness is not clear. It does not appear related to blood pressure or rhythm issues by pacemaker interrogation. Will pursue 2D echocardiogram to re-evaluate valvular heart disease. Will also arrange CT angio head/neck to evaluate for cerebrovascular disease, as well as f/u appt in about a week. He should also f/u with his PCP to evaluate for inner ear trouble and eye doctor as planned. He was instructed to f/u PCP for CBC abnormalities. Will send in a trial of meclizine. He should change his metoprolol to 1/2 tab BID so that he is getting 24 hr coverage. He is not to drive if he is feeling poorly. Chest pain is atypical and troponin is negative. This is the same discomfort he has had for 8 years (once a week, lasting 1-2 minutes at a time, nonexertional) thus no further workup beyond echo  at present. Our office will call him to arrange his follow-up.  Signed, Charlie Pitter PA-C 07/05/2013, 4:33 PM  I seen and evaluated the patient along with Ms. Purcell Mouton. I have personally reviewed the chart and examined the patient. Has a long-standing history of CAD would most standing recurrent discomfort in the chest that has been evaluated at least twice with a negative Myoview. We were asked to see him due to chest discomfort, but he came to the emergency room for dizziness. Chest pain is secondary, and very similar to his chronic discomfort which is not similar to his angina.  He does have  dizziness has gotten worse over last few weeks. Despite stopping his ACE inhibitor in coming back his beta blocker dose he still has dizziness. It is worse with sitting up and standing, but not necessarily orthostatic in nature as he was dizzy sitting up while they were checking orthostatic blood pressures that showed no change. On exam he is a highly positive Romberg study suggesting either posterior column or vertebrobasilar insufficiency.  I agree that most of his evaluation can be done as an outpatient. We will start meclizine as some of his symptoms sound like vertigo. We will proceed with neck and head CTA to evaluate for vertebrobasilar insufficiency, his recent carotid Dopplers did not comment on vertebral arteries. In no carotid disease significant. He has had a relatively recent stress tests, but has not had an echocardiogram every year. We will get an outpatient echo.  As for the dizziness, will see her in followup if discussed the results of the CT scan echocardiogram. He should have lab values such as RPR, vitamin B12 level checked for possible posterior column defects. I would defer further evaluation of dizziness to his PCP once we have excluded the cardiovascular etiology. He may warrant neurologic evaluation as well. I agree with allowing his blood pressure to stay somewhat elevated for permissive hypertension final pressures are not above 160/90 mmHg.   We will arrange outpatient followup and have ordered labs and the meclizine. Discussed with Dr. Cathleen Fears, EDP who will arrange to the patient's discharge from the ER.  Leonie Man, M.D., M.S. Interventional Cardiologist  [place] Pager # [phone] 07/05/2013

## 2013-07-05 NOTE — ED Notes (Signed)
Charge nurse at bedside to interrogate pacemaker

## 2013-07-05 NOTE — ED Notes (Signed)
First contact with pt. Pt ambulates without distress.

## 2013-07-05 NOTE — ED Notes (Signed)
Admitting MD at bedside.

## 2013-07-05 NOTE — ED Notes (Addendum)
Patient has been experiencing dizziness when standing for about 3 weeks, also c/o chest pressure intermittently last night and this morning, pain started when patient was rising out of bed.  Described as a light pain by patient, relieved with 1 nitroglycerin that he took at 0700. Currently pain free., Patient is dizzy at the moment  Patient has hx of AMI (94), CAD, PAF, AAA, hyperlipidemia, HTN, PVD, pacemaker, SA node dysfunction, CABG, and cardiac cath

## 2013-07-05 NOTE — Discharge Instructions (Signed)
Decrease your dose of metoprolol to one half tablet daily. The cardiology office will contact you to schedule a followup appointment

## 2013-07-05 NOTE — ED Provider Notes (Addendum)
CSN: [id number]     Arrival date & time 07/05/13  1259 History   First MD Initiated Contact with Patient 07/05/13 1439     Chief Complaint  Patient presents with  . Chest Pain  . Dizziness     (Consider location/radiation/quality/duration/timing/severity/associated sxs/prior Treatment) HPI Complains of intermittent chest pain anterior substernal nonradiating onset 3 weeks ago. Symptoms accompanied by lightheadedness. Onset at rest. He presently feels lightheaded, no chest pain. He was seen by his primary care physician a few weeks ago in blood pressure medicine was decreased. No other associated symptoms no shortness of breath no nausea no sweatiness. He has been using nitroglycerin sublingual more frequently than normal over the past 2-3 weeks. He called his cardiology office who advised him to come here for evaluation. No other associated symptoms no blood parenchyma. No abdominal pain. Past Medical History  Diagnosis Date  . CAD (coronary artery disease)     Last cath 02/2002 occluded VG-RCA, other grafts patent, EF 50% last Nuc 02/01/2009, CABG 1997  . PAF (paroxysmal atrial fibrillation)   . Syncope 11/28/97  . AAA (abdominal aortic aneurysm)     Llast aortic ultrasound 12/16/11 3.51 X 3.63  . OSA on CPAP   . H/O prostate cancer   . Aortic stenosis, mild     Last echo 05/04/12 +LVH  . Back pain     hx epidural injections  . Acne rosacea   . H/O: GI bleed     mild, neg. colonoscopy  . Hyperlipidemia   . Thrombocytopenia     chronic, mild  . GERD (gastroesophageal reflux disease)   . COPD (chronic obstructive pulmonary disease)   . HTN (hypertension)   . Baker's cyst     Lt.  [name] PVD (peripheral vascular disease)     rt ext iliac stenosis, last PV cath 2003, moderate stenosis last Lower Ext Dopplers 12/16/11, Rt. ABI 0.96  Lt ABI 1.0  . Pacemaker -Douds with change out 2006  . Sinus node dysfunction 01/20/2013   Past Surgical History  Procedure Laterality  Date  . Coronary artery bypass graft  1997    LIMA-LAD; VG-diag; seq VG-1st OM & distal LCX; VG-RCA  . Pacemaker insertion  11/28/97    pacesetter--ERI 2011  . Pacemaker generator change  08/15/2009    St. Jude accent--last checked2/08/2012  . Suprapubic prostatectomy    . Cardiac catheterization  2003    VG to RCA occluded, other grafts patent  . Coronary angioplasty  03/29/92    PTCA to LCX   No family history on file. History  Substance Use Topics  . Smoking status: Former Smoker    Quit date: 03/17/1996  . Smokeless tobacco: Not on file  . Alcohol Use: Yes     Comment: occ    Review of Systems  Constitutional: Negative.   HENT: Negative.   Respiratory: Negative.   Cardiovascular: Negative.   Gastrointestinal: Negative.   Musculoskeletal: Negative.   Skin: Negative.   Neurological: Negative.   Psychiatric/Behavioral: Negative.       Allergies  Review of patient's allergies indicates no known allergies.  Home Medications   Prior to Admission medications   Medication Sig Start Date End Date Taking? Authorizing Provider  aspirin EC 81 MG tablet Take 81 mg by mouth every evening.   Yes Historical Provider, MD  Celecoxib (CELEBREX PO) Take 1 tablet by mouth 2 (two) times daily.   Yes Historical Provider, MD  fluocinonide ([place])  0.05 % external solution Apply 1 application topically 2 (two) times daily.   Yes Historical Provider, MD  hydrocortisone 2.5 % cream Apply 1 application topically 2 (two) times daily as needed (rash).   Yes Historical Provider, MD  metoprolol tartrate (LOPRESSOR) 25 MG tablet Take 25 mg by mouth every morning.   Yes Historical Provider, MD  nabumetone (RELAFEN) 500 MG tablet Take 1 tablet (500 mg total) by mouth 2 (two) times daily. 01/20/13  Yes Deboraha Sprang, MD  Omega-3 Fatty Acids (FISH OIL) 1200 MG CAPS Take 1,200 mg by mouth every evening.   Yes Historical Provider, MD  simvastatin (ZOCOR) 80 MG tablet Take 1 tablet (80 mg total) by mouth at  bedtime. 05/18/13  Yes Deboraha Sprang, MD  traZODone (DESYREL) 150 MG tablet Take by mouth at bedtime.   Yes Historical Provider, MD  triamterene-hydrochlorothiazide (DYAZIDE) 37.5-25 MG per capsule Take 1 capsule by mouth daily - Monday through Friday 02/24/13  Yes Troy Sine, MD  vitamin C (ASCORBIC ACID) 500 MG tablet Take 500 mg by mouth every evening.   Yes Historical Provider, MD  amoxicillin (AMOXIL) 500 MG capsule Take 1,000 mg by mouth 3 (three) times daily.    Historical Provider, MD  ranitidine (ZANTAC) 150 MG tablet Take 150 mg by mouth daily as needed for heartburn.    Historical Provider, MD   BP 148/70  Pulse 54  Temp(Src) 98.5 F (36.9 C) (Oral)  Resp 16  Ht 5\' 7"  (1.702 m)  Wt 196 lb (88.905 kg)  BMI 30.69 kg/m2  SpO2 95% Physical Exam  Nursing note and vitals reviewed. Constitutional: He appears well-developed and well-nourished.  HENT:  Head: Normocephalic and atraumatic.  Eyes: Conjunctivae are normal. Pupils are equal, round, and reactive to light.  Neck: Neck supple. No tracheal deviation present. No thyromegaly present.  Cardiovascular: Normal rate and regular rhythm.   No murmur heard. Pulmonary/Chest: Effort normal and breath sounds normal.  Abdominal: Soft. Bowel sounds are normal. He exhibits no distension. There is no tenderness.  Musculoskeletal: Normal range of motion. He exhibits no edema and no tenderness.  Neurological: He is alert. Coordination normal.  Skin: Skin is warm and dry. No rash noted.  Psychiatric: He has a normal mood and affect.    ED Course  Procedures (including critical care time) Labs Review Labs Reviewed  BASIC METABOLIC PANEL - Abnormal; Notable for the following:    GFR calc non Af Amer 79 (*)    All other components within normal limits  CBC - Abnormal; Notable for the following:    WBC 3.8 (*)    RBC 3.86 (*)    Hemoglobin 12.7 (*)    HCT 36.8 (*)    Platelets 102 (*)    All other components within normal limits   I-STAT TROPOININ, ED    Imaging Review Dg Chest 2 View  07/05/2013   CLINICAL DATA:  Left-sided chest pain  EXAM: CHEST  2 VIEW  COMPARISON:  CT UROGRAM dated 01/09/2012; DG CHEST 2 VIEW dated 06/26/2009  FINDINGS: There is no focal parenchymal opacity, pleural effusion, or pneumothorax. The heart and mediastinal contours are unremarkable. Prior CABG. Dual lead cardiac pacer.  There is mild thoracic spine spondylosis.  IMPRESSION: No active cardiopulmonary disease.   Electronically Signed   By: Kathreen Devoid   On: 07/05/2013 13:35     EKG Interpretation   Date/Time:  Tuesday July 05 2013 13:02:07 EDT Ventricular Rate:  55 PR Interval:  210 QRS Duration: 98 QT Interval:  422 QTC Calculation: 403 R Axis:   72 Text Interpretation:  Electronic atrial pacemaker Nonspecific ST and T  wave abnormality Abnormal ECG rhythm paced as co mpared to last tracing  Confirmed by Winfred Leeds  MD, Shatima Zalar [phone]) on 07/05/2013 3:07:19 PM      Results for orders placed during the hospital encounter of 49/20/10  BASIC METABOLIC PANEL      Result Value Ref Range   Sodium 138  137 - 147 mEq/L   Potassium 4.8  3.7 - 5.3 mEq/L   Chloride 103  96 - 112 mEq/L   CO2 25  19 - 32 mEq/L   Glucose, Bld 85  70 - 99 mg/dL   BUN 18  6 - 23 mg/dL   Creatinine, Ser 0.93  0.50 - 1.35 mg/dL   Calcium 9.6  8.4 - 10.5 mg/dL   GFR calc non Af Amer 79 (*) >90 mL/min   GFR calc Af Amer >90  >90 mL/min  CBC      Result Value Ref Range   WBC 3.8 (*) 4.0 - 10.5 K/uL   RBC 3.86 (*) 4.22 - 5.81 MIL/uL   Hemoglobin 12.7 (*) 13.0 - 17.0 g/dL   HCT 36.8 (*) 39.0 - 52.0 %   MCV 95.3  78.0 - 100.0 fL   MCH 32.9  26.0 - 34.0 pg   MCHC 34.5  30.0 - 36.0 g/dL   RDW 12.6  11.5 - 15.5 %   Platelets 102 (*) 150 - 400 K/uL  I-STAT TROPOININ, ED      Result Value Ref Range   Troponin i, poc 0.01  0.00 - 0.08 ng/mL   Comment 3            Dg Chest 2 View  07/05/2013   CLINICAL DATA:  Left-sided chest pain  EXAM: CHEST  2 VIEW   COMPARISON:  CT UROGRAM dated 01/09/2012; DG CHEST 2 VIEW dated 06/26/2009  FINDINGS: There is no focal parenchymal opacity, pleural effusion, or pneumothorax. The heart and mediastinal contours are unremarkable. Prior CABG. Dual lead cardiac pacer.  There is mild thoracic spine spondylosis.  IMPRESSION: No active cardiopulmonary disease.   Electronically Signed   By: Kathreen Devoid   On: 07/05/2013 13:35    Patient's pacemaker was interrogated. Chest x-ray viewed by me MDM  Cardiology service called to evaluate patient for admission Diagnoses #1 unstable angina #2 weakness Final diagnoses:  None        Orlie Dakin, MD 07/05/13 1558 Addendum Dr.Harding evaluated patient in the emergency department and made arrangements for outpatient followup .patient is instructed to decrease his dose of metoprolol to half the current dose by cardiology service. Patient is comfortable with disposition.  Orlie Dakin, MD 07/05/13 Vernelle Emerald

## 2013-07-05 NOTE — Telephone Encounter (Signed)
There was some miss communication of patients symptoms. Due to Protocol for chest pain, should of been sent to Triage    Called patient he states he has had  chest pains for 2 weeks and dizziness he has taken nitro to ease pain  Pt is advised to go to ER due to  Chest pain per Murlean Hark, will cancel appt at 2:00 with Murlean Hark.

## 2013-07-05 NOTE — ED Notes (Signed)
Pt presents with c/o dizziness for the past two weeks and also left sided cp off and on since yesterday. Denies any assoc symptoms with cp. States his BP's have been okay. Does have a pacemaker.

## 2013-07-06 ENCOUNTER — Telehealth: Payer: Self-pay | Admitting: Cardiovascular Disease

## 2013-07-06 NOTE — Telephone Encounter (Signed)
Closed encounters °

## 2013-07-11 ENCOUNTER — Telehealth (HOSPITAL_COMMUNITY): Payer: Self-pay | Admitting: *Deleted

## 2013-07-12 ENCOUNTER — Ambulatory Visit (HOSPITAL_COMMUNITY)
Admission: RE | Admit: 2013-07-12 | Discharge: 2013-07-12 | Disposition: A | Discharge status: Home / Self Care | Payer: PRIVATE HEALTH INSURANCE | Source: Ambulatory Visit | Attending: Cardiovascular Disease | Admitting: Cardiovascular Disease

## 2013-07-12 ENCOUNTER — Ambulatory Visit (INDEPENDENT_AMBULATORY_CARE_PROVIDER_SITE_OTHER): Payer: PRIVATE HEALTH INSURANCE | Admitting: Cardiology

## 2013-07-12 VITALS — BP 130/60 | HR 59 | Ht 67.0 in | Wt 202.0 lb

## 2013-07-12 DIAGNOSIS — R42 Dizziness and giddiness: Secondary | ICD-10-CM

## 2013-07-12 DIAGNOSIS — R011 Cardiac murmur, unspecified: Secondary | ICD-10-CM

## 2013-07-12 DIAGNOSIS — I350 Nonrheumatic aortic (valve) stenosis: Secondary | ICD-10-CM

## 2013-07-12 DIAGNOSIS — I359 Nonrheumatic aortic valve disorder, unspecified: Secondary | ICD-10-CM | POA: Insufficient documentation

## 2013-07-12 NOTE — Progress Notes (Signed)
Patient ID: [name], male   DOB: 05-23-1934, 79 y.o.   MRN: [id number]    07/19/2013 [name]   [birth year]  [id number]  Primary Physicia Curtis Downing, MD Primary Cardiologist: Dr. Caryl Sims  HPI:  [name] is a 79 y/o M with history of CAD s/p CABG 1997 (last cath 2003 with occluded SVG-RCA, last nuc 09/2012 was normal), remote PAF per record, SSS/syncope s/p St. Jude pacemaker (last gen change 2011), 3.8cm AAA by [place] 01/2013, asymptomatic right iliac stenosis, mild carotid plaque by duplex 01/2013, and valvular heart disease (mild-mod AS, mild AI, mild MR by echo 04/2012) who presented to [facility] on 06/26/13 with a 3 week history of dizziness, despite stopping his ACE inhibitor and cutting back his beta blocker. He was seen by Dr. Ellyn Sims in the ER. Physical exam revealed that he was not orthostatic. Pacemaker interrogation was unremarkable. He did however have a highly positive Romberg study suggesting either posterior column or vertebrobasilar insufficiency. Based on this, it was recommended that he have a CT of the neck and head to evaluate for vertebrobasilar insufficiency. He also recommended a 2D echo, to assess a cardiac murmur that was auscultated on exam. However, it was felt that his work-up could be completed as an outpatient. He was given a trial of meclizine for possible vertigo. He was discharged directly from the ED.  He presents to clinic today for f/u. He reports some improvement with the addition of meclizine, however still notes frequent dizzy spells. He denies any other associated symptoms. Unfortunately, he presents to clinic today w/o obtaining the recommend studies, thus we have no findings to review today.    Current Outpatient Prescriptions  Medication Sig Dispense Refill  . aspirin EC 81 MG tablet Take 81 mg by mouth every evening.      . Celecoxib (CELEBREX PO) Take 1 tablet by mouth 2 (two) times daily.      . fluocinonide (LIDEX) 0.05 % external  solution Apply 1 application topically 2 (two) times daily.      . hydrocortisone 2.5 % cream Apply 1 application topically 2 (two) times daily as needed (rash).      . metoprolol tartrate (LOPRESSOR) 25 MG tablet Take 0.5 tablets (12.5 mg total) by mouth 2 (two) times daily.      . nabumetone (RELAFEN) 500 MG tablet Take 1 tablet (500 mg total) by mouth 2 (two) times daily.  60 tablet  0  . naproxen (NAPROSYN) 500 MG tablet       . Omega-3 Fatty Acids (FISH OIL) 1200 MG CAPS Take 1,200 mg by mouth every evening.      . ranitidine (ZANTAC) 150 MG tablet Take 150 mg by mouth daily as needed for heartburn.      . simvastatin (ZOCOR) 80 MG tablet Take 1 tablet (80 mg total) by mouth at bedtime.  90 tablet  1  . temazepam (RESTORIL) 30 MG capsule       . traZODone (DESYREL) 150 MG tablet Take by mouth at bedtime.      . triamterene-hydrochlorothiazide (DYAZIDE) 37.5-25 MG per capsule Take 1 capsule by mouth daily - Monday through Friday  30 capsule  4  . vitamin C (ASCORBIC ACID) 500 MG tablet Take 500 mg by mouth every evening.      . meclizine (ANTIVERT) 25 MG tablet Take 1 tablet (25 mg total) by mouth 2 (two) times daily as needed for dizziness.  20 tablet  0   No current facility-administered  medications for this visit.    No Known Allergies  History   Social History  . Marital Status: Widowed    Spouse Name: N/A    Number of Children: 34  . Years of Education: N/A   Occupational History  . Not on file.   Social History Main Topics  . Smoking status: Former Smoker    Quit date: 03/17/1996  . Smokeless tobacco: Not on file  . Alcohol Use: Yes     Comment: occ  . Drug Use: Not on file  . Sexual Activity: Not on file   Other Topics Concern  . Not on file   Social History Narrative  . No narrative on file     Review of Systems: General: negative for chills, fever, night sweats or weight changes.  Cardiovascular: negative for chest pain, dyspnea on exertion, edema,  orthopnea, palpitations, paroxysmal nocturnal dyspnea or shortness of breath Dermatological: negative for rash Respiratory: negative for cough or wheezing Urologic: negative for hematuria Abdominal: negative for nausea, vomiting, diarrhea, bright red blood per rectum, melena, or hematemesis Neurologic: negative for visual changes, syncope, or dizziness All other systems reviewed and are otherwise negative except as noted above.    Blood pressure 130/60, pulse 59, height 5\' 7"  (1.702 m), weight 202 lb (91.627 kg).  General appearance: alert, cooperative and no distress Neck: no carotid bruit and no JVD Lungs: clear to auscultation bilaterally Heart: regular rate and rhythm, 2/6 murmur Extremities: no LEE Pulses: 2+ and symmetric Skin: warm and dry Neurologic: Grossly normal  EKG sinus brady, 59 bpm  ASSESSMENT AND PLAN:   Dizziness Mild improvement after addition of meclizine. However, patient has not completed studies that were ordered for him, including CT of neck and head and 2D echo. We hope that these studies will help determine the etiology for his symptoms. Pt was instructed to get both test done fairly soon and to return back for f/u once both are completed.      PLAN  Obtain CT of head and neck and 2D echo. Return for f/u to review test results in 1-2 weeks.   Curtis Granderson SimmonsPA-C 07/19/2013 5:54 PM

## 2013-07-12 NOTE — Progress Notes (Signed)
2D Echocardiogram Complete.  07/12/2013   Dondi Aime, RDCS

## 2013-07-18 ENCOUNTER — Ambulatory Visit
Admission: RE | Admit: 2013-07-18 | Discharge: 2013-07-18 | Disposition: A | Discharge status: Home / Self Care | Payer: PRIVATE HEALTH INSURANCE | Source: Ambulatory Visit | Attending: Physician Assistant | Admitting: Physician Assistant

## 2013-07-18 DIAGNOSIS — R42 Dizziness and giddiness: Secondary | ICD-10-CM

## 2013-07-18 MED ORDER — IOHEXOL 350 MG/ML SOLN
100.0000 mL | Freq: Once | INTRAVENOUS | Status: AC | PRN
  Administered 2013-07-18: 100 mL via INTRAVENOUS

## 2013-07-19 ENCOUNTER — Encounter: Payer: Self-pay | Admitting: Cardiology

## 2013-07-19 DIAGNOSIS — R42 Dizziness and giddiness: Secondary | ICD-10-CM | POA: Insufficient documentation

## 2013-07-19 NOTE — Assessment & Plan Note (Signed)
Mild improvement after addition of meclizine. However, patient has not completed studies that were ordered for him, including CT of neck and head and 2D echo. We hope that these studies will help determine the etiology for his symptoms. Pt was instructed to get both test done fairly soon and to return back for f/u once both are completed.

## 2013-07-22 ENCOUNTER — Ambulatory Visit (INDEPENDENT_AMBULATORY_CARE_PROVIDER_SITE_OTHER): Payer: PRIVATE HEALTH INSURANCE | Admitting: Cardiology

## 2013-07-22 ENCOUNTER — Encounter: Payer: Self-pay | Admitting: Cardiology

## 2013-07-22 VITALS — BP 100/60 | HR 60 | Ht 67.0 in | Wt 202.0 lb

## 2013-07-22 DIAGNOSIS — R42 Dizziness and giddiness: Secondary | ICD-10-CM

## 2013-07-22 DIAGNOSIS — I359 Nonrheumatic aortic valve disorder, unspecified: Secondary | ICD-10-CM

## 2013-07-22 DIAGNOSIS — Z45018 Encounter for adjustment and management of other part of cardiac pacemaker: Secondary | ICD-10-CM

## 2013-07-22 DIAGNOSIS — I350 Nonrheumatic aortic (valve) stenosis: Secondary | ICD-10-CM

## 2013-07-22 NOTE — Assessment & Plan Note (Signed)
Followed by Dr. Klein 

## 2013-07-22 NOTE — Assessment & Plan Note (Signed)
Stable. Moderate AS with mild MR on most recent cath 07/12/13.

## 2013-07-22 NOTE — Patient Instructions (Signed)
1. We are setting up an appt. With neurology for your dizziness and known posterior disease  2. Keep your already scheduled appt with Dr. [name]

## 2013-07-22 NOTE — Assessment & Plan Note (Signed)
CT of the head and neck was reviewed by Dr. Ellyn Hack. Based on these findings (listed above), and his extremely positive Romberg test, Dr. Ellyn Hack feels that it is best to refer to neurology for evaluation for possible vertebrobasilar insufficiency. Referral was placed today.

## 2013-07-22 NOTE — Progress Notes (Signed)
Patient ID: [name], male   DOB: 21-Dec-1934, 79 y.o.   MRN: [id number]     07/22/2013 [name]   1935-01-08  [id number]  Primary Physicia [name], MD Primary Cardiologist: Dr. [name]  HPI:  [name] is a 79 y/o WM with a history of CAD s/p CABG 1997 (last cath 2003 with occluded SVG-RCA, last nuc 09/2012 was normal), remote PAF per record, SSS/syncope s/p St. Jude pacemaker (last gen change 2011), 3.8cm AAA by [place] 01/2013, asymptomatic right iliac stenosis, mild carotid plaque by duplex 01/2013, and valvular heart disease (mild-mod AS, mild AI, mild MR by echo 04/2012) who presented to [facility] on 06/26/13 with a 3 week history of dizziness, despite stopping his ACE inhibitor and cutting back his beta blocker. He was seen by Dr. Ellyn Hack in the ER. Physical exam revealed that he was not orthostatic. Pacemaker interrogation was unremarkable. He did however have a highly positive Romberg study suggesting either posterior column or vertebrobasilar insufficiency. Based on this, it was recommended that he have a CT of the neck and head to evaluate for vertebrobasilar insufficiency. Dr. Ellyn Hack also recommended a 2D echo, to assess a cardiac murmur that was auscultated on exam. However, it was felt that his work-up could be completed as an outpatient. He was given a trial of meclizine for possible vertigo. He was discharged directly from the ED.  He presents to clinic today for f/u. He reports some improvement with the addition of meclizine, however still notes frequent dizzy spells, that occur almost daily. He denies any other associated symptoms.   His 2D echo reveals normal systolic function, with an EF of 55-60%. There was moderate aortic stenosis and mild regurgitation. His CT of his head demonstrated atrophy and chronic microvascular ischemia. There was moderate stenosis in the cavernous carotid bilaterally laterally due to atherosclerotic disease. There was mild stenosis  of the distal vertebral artery bilaterally, as well as calcified plaque throughout the carotid bifurcation bilaterally without significant carotid stenosis.     Current Outpatient Prescriptions  Medication Sig Dispense Refill  . aspirin EC 81 MG tablet Take 81 mg by mouth every evening.      . Celecoxib (CELEBREX PO) Take 1 tablet by mouth 2 (two) times daily.      . fluocinonide (LIDEX) 0.05 % external solution Apply 1 application topically 2 (two) times daily.      . hydrocortisone 2.5 % cream Apply 1 application topically 2 (two) times daily as needed (rash).      . meclizine (ANTIVERT) 25 MG tablet Take 1 tablet (25 mg total) by mouth 2 (two) times daily as needed for dizziness.  20 tablet  0  . metoprolol tartrate (LOPRESSOR) 25 MG tablet Take 0.5 tablets (12.5 mg total) by mouth 2 (two) times daily.      . nabumetone (RELAFEN) 500 MG tablet Take 1 tablet (500 mg total) by mouth 2 (two) times daily.  60 tablet  0  . naproxen (NAPROSYN) 500 MG tablet       . Omega-3 Fatty Acids (FISH OIL) 1200 MG CAPS Take 1,200 mg by mouth every evening.      . ranitidine (ZANTAC) 150 MG tablet Take 150 mg by mouth daily as needed for heartburn.      . simvastatin (ZOCOR) 80 MG tablet Take 1 tablet (80 mg total) by mouth at bedtime.  90 tablet  1  . temazepam (RESTORIL) 30 MG capsule       . traZODone (DESYREL)  150 MG tablet Take by mouth at bedtime.      . triamterene-hydrochlorothiazide (DYAZIDE) 37.5-25 MG per capsule Take 1 capsule by mouth daily - Monday through Friday  30 capsule  4  . vitamin C (ASCORBIC ACID) 500 MG tablet Take 500 mg by mouth every evening.       No current facility-administered medications for this visit.    No Known Allergies  History   Social History  . Marital Status: Widowed    Spouse Name: N/A    Number of Children: 28  . Years of Education: N/A   Occupational History  . Not on file.   Social History Main Topics  . Smoking status: Former Smoker    Quit  date: 03/17/1996  . Smokeless tobacco: Not on file  . Alcohol Use: Yes     Comment: occ  . Drug Use: Not on file  . Sexual Activity: Not on file   Other Topics Concern  . Not on file   Social History Narrative  . No narrative on file     Review of Systems: General: negative for chills, fever, night sweats or weight changes.  Cardiovascular: negative for chest pain, dyspnea on exertion, edema, orthopnea, palpitations, paroxysmal nocturnal dyspnea or shortness of breath Dermatological: negative for rash Respiratory: negative for cough or wheezing Urologic: negative for hematuria Abdominal: negative for nausea, vomiting, diarrhea, bright red blood per rectum, melena, or hematemesis Neurologic: negative for visual changes, syncope, or dizziness All other systems reviewed and are otherwise negative except as noted above.    Blood pressure 100/60, pulse 60, height 5\' 7"  (1.702 m), weight 202 lb (91.627 kg).  General appearance: alert, cooperative and no distress Neck: no carotid bruit and no JVD Lungs: clear to auscultation bilaterally Heart: regular rate and rhythm, 2/6 SM murmur heard throughout the precordium, loudest over RUSB Extremities: no LEE Pulses: 2+ and symmetric Skin: warm and dry Neurologic: Grossly normal  EKG sinus brady, 59 bpm  ASSESSMENT AND PLAN:   Dizziness CT of the head and neck was reviewed by Dr. Ellyn Hack. Based on these findings (listed above), and his extremely positive Romberg test, Dr. Ellyn Hack feels that it is best to refer to neurology for evaluation for possible vertebrobasilar insufficiency. Referral was placed today.  Aortic stenosis, moderate Stable. Moderate AS with mild MR on most recent cath 07/12/13.  Pacemaker -St Judes Followed by Dr. Almira Bar Dissussed with Dr. Ellyn Hack. He has reviewd both his 2D echo and CT of head and neck and has recommended referral to neurology for possible vertebrobasilar insufficiency. He will resume  regular routien cardiovascular f/u with Dr. [name].     07/22/2013 1:48 PM

## 2013-07-25 ENCOUNTER — Telehealth: Payer: Self-pay | Admitting: Cardiology

## 2013-07-25 NOTE — Telephone Encounter (Signed)
Faxed referral to [facility].

## 2013-07-28 ENCOUNTER — Ambulatory Visit (INDEPENDENT_AMBULATORY_CARE_PROVIDER_SITE_OTHER): Payer: PRIVATE HEALTH INSURANCE | Admitting: *Deleted

## 2013-07-28 DIAGNOSIS — I495 Sick sinus syndrome: Secondary | ICD-10-CM

## 2013-07-28 LAB — MDC_IDC_ENUM_SESS_TYPE_REMOTE
Brady Statistic AP VP Percent: 1 %
Brady Statistic AP VS Percent: 18 %
Brady Statistic AS VS Percent: 82 %
Brady Statistic RV Percent Paced: 1 %
Lead Channel Impedance Value: 360 Ohm
Lead Channel Impedance Value: 480 Ohm
Lead Channel Pacing Threshold Amplitude: 0.5 V
Lead Channel Pacing Threshold Amplitude: 1.25 V
Lead Channel Pacing Threshold Pulse Width: 0.4 ms
Lead Channel Pacing Threshold Pulse Width: 0.7 ms
Lead Channel Sensing Intrinsic Amplitude: 11.3 mV
Lead Channel Setting Pacing Amplitude: 1.5 V
Lead Channel Setting Pacing Amplitude: 2 V
Lead Channel Setting Sensing Sensitivity: 2 mV
MDC IDC MSMT BATTERY REMAINING LONGEVITY: 91 mo
MDC IDC MSMT BATTERY VOLTAGE: 2.95 V
MDC IDC MSMT LEADCHNL RA SENSING INTR AMPL: 0.5 mV
MDC IDC PG SERIAL: 7130714
MDC IDC SESS DTM: 20150514130626
MDC IDC SET LEADCHNL RV PACING PULSEWIDTH: 0.7 ms
MDC IDC STAT BRADY AS VP PERCENT: 1 %
MDC IDC STAT BRADY RA PERCENT PACED: 18 %

## 2013-07-28 NOTE — Progress Notes (Signed)
Remote pacemaker transmission.   

## 2013-08-01 ENCOUNTER — Institutional Professional Consult (permissible substitution): Payer: PRIVATE HEALTH INSURANCE | Admitting: Neurology

## 2013-08-04 ENCOUNTER — Ambulatory Visit (INDEPENDENT_AMBULATORY_CARE_PROVIDER_SITE_OTHER): Payer: PRIVATE HEALTH INSURANCE | Admitting: Neurology

## 2013-08-04 ENCOUNTER — Encounter: Payer: Self-pay | Admitting: Neurology

## 2013-08-04 VITALS — BP 125/72 | HR 74 | Ht 66.0 in | Wt 202.0 lb

## 2013-08-04 DIAGNOSIS — R42 Dizziness and giddiness: Secondary | ICD-10-CM

## 2013-08-04 HISTORY — DX: Dizziness and giddiness: R42

## 2013-08-04 NOTE — Progress Notes (Signed)
Reason for visit: Dizziness  [name] is a 79 y.o. male  History of present illness:  [name] is a 79 year old right-handed white male with a history of coronary artery disease, and history of a pacemaker placement. The patient indicates that he has a chronic history of headaches that have been present for about 3 years. The headaches are present at all times and are throughout the head. The headaches are dull, achy, and the patient rarely takes medications for the headache. Suddenly, one month ago, the patient indicates that he began having dizziness associated with lightheaded sensations, and gait imbalance. The patient denied true vertigo. The patient had no other symptoms with the dizziness such as nausea, vomiting, increased headache, double vision, loss of vision. The patient had no numbness or weakness on the face, arms, or legs. The patient was seen by cardiology, and he was set up for a CT scan of the brain, and CT angiogram of the head and neck. These studies showed some atherosclerotic changes, without critical stenosis, and without evidence of acute stroke on CT. The patient is unable to obtain MRI evaluation of the brain. The patient denies neck discomfort with the headaches or the dizziness. The patient does have a chronic issue with decreased hearing, and tinnitus affecting the left ear. The patient has no history of blackout episodes. He denies problems controlling the bowels or the bladder. The patient reports floaters in addition, otherwise no visual disturbance. He is sent to this office for an evaluation.  Past Medical History  Diagnosis Date  . CAD (coronary artery disease)     a. s/p MI/PTCA - Cx 1994. b. s/p CABG x5 in 1997. c. Abnl nuc 2003- occ SVG-RCA, patent seq SVG-OM1-dCxOM, patentl LIMA-LAD, patent, SVG-diag. d. Normal nuc 09/2012.  [name] PAF (paroxysmal atrial fibrillation)     a. Remote per records.   . Syncope 11/28/97  . AAA (abdominal aortic aneurysm)    a. Last duplex - 3.8cm 01/2013 - due 01/2014.  . OSA on CPAP   . H/O prostate cancer     a. s/p radical retropubic prostatectomy with bilateral pelvic lymph node dissection  . Aortic stenosis, mild     Last echo 05/04/12 +LVH  . Back pain     hx epidural injections  . Acne rosacea   . H/O: GI bleed     mild, neg. colonoscopy  . Hyperlipidemia   . Thrombocytopenia     chronic, mild  . GERD (gastroesophageal reflux disease)   . COPD (chronic obstructive pulmonary disease)   . HTN (hypertension)   . Baker's cyst     Lt.  [name] PVD (peripheral vascular disease)     a. Rt ext iliac stenosis, last PV cath 2003, moderate stenosis last Lower Ext Dopplers 12/16/11 - Rt. ABI 0.96  Lt ABI 1.0.  . Pacemaker -[place] with change out 2006  . Sinus node dysfunction 01/20/2013  . Hypertensive heart disease   . Sick sinus syndrome     a. placed 1999, gen change 2011 - [employer]. Jude.  . Valvular heart disease     a. Echo 04/2012: mild-mod AS, mild AI, mild MR.  . Carotid artery disease     a. Duplex 01/2013: mildly abnormal. Mild plaque without significant diameter reduction. Repeat recommended 11/15.  [name] HOH (hard of hearing)   . Dizziness and giddiness 08/04/2013    Past Surgical History  Procedure Laterality Date  . Coronary artery bypass  graft  1997    LIMA-LAD; VG-diag; seq VG-1st OM & distal LCX; VG-RCA  . Pacemaker insertion  11/28/97    pacesetter--ERI 2011  . Pacemaker generator change  08/15/2009    St. Jude accent  . Suprapubic prostatectomy    . Cardiac catheterization  2003    VG to RCA occluded, other grafts patent  . Coronary angioplasty  03/29/92    PTCA to LCX  . Cataracts    . Cataract extraction Bilateral     Family History  Problem Relation Age of Onset  . CAD    . Colon cancer Father   . Cancer Brother   . Heart Problems Brother   . Heart Problems Sister     Social history:  reports that he quit smoking about 20 years ago. His smoking use included  Cigarettes. He has a 96 pack-year smoking history. He has never used smokeless tobacco. He reports that he drinks alcohol. He reports that he does not use illicit drugs.  Medications:  Current Outpatient Prescriptions on File Prior to Visit  Medication Sig Dispense Refill  . aspirin EC 81 MG tablet Take 81 mg by mouth every evening.      . Celecoxib (CELEBREX PO) Take 1 tablet by mouth 2 (two) times daily.      . fluocinonide (LIDEX) 0.05 % external solution Apply 1 application topically 2 (two) times daily.      . hydrocortisone 2.5 % cream Apply 1 application topically 2 (two) times daily as needed (rash).      . metoprolol tartrate (LOPRESSOR) 25 MG tablet Take 0.5 tablets (12.5 mg total) by mouth 2 (two) times daily.      . nabumetone (RELAFEN) 500 MG tablet Take 1 tablet (500 mg total) by mouth 2 (two) times daily.  60 tablet  0  . naproxen (NAPROSYN) 500 MG tablet       . Omega-3 Fatty Acids (FISH OIL) 1200 MG CAPS Take 1,200 mg by mouth every evening.      . ranitidine (ZANTAC) 150 MG tablet Take 150 mg by mouth daily as needed for heartburn.      . simvastatin (ZOCOR) 80 MG tablet Take 1 tablet (80 mg total) by mouth at bedtime.  90 tablet  1  . temazepam (RESTORIL) 30 MG capsule       . traZODone (DESYREL) 150 MG tablet Take by mouth at bedtime.      . triamterene-hydrochlorothiazide (DYAZIDE) 37.5-25 MG per capsule Take 1 capsule by mouth daily - Monday through Friday  30 capsule  4  . vitamin C (ASCORBIC ACID) 500 MG tablet Take 500 mg by mouth every evening.       No current facility-administered medications on file prior to visit.     No Known Allergies  ROS:  Out of a complete 14 system review of symptoms, the patient complains only of the following symptoms, and all other reviewed systems are negative.  Weight loss Heart murmur Hearing loss, ringing in the ears Loss of vision, eye pain Shortness of breath, cough, wheezing, snoring Allergies, runny nose Memory loss,  headache, dizziness  Blood pressure 125/72, pulse 74, height 5\' 6"  (1.676 m), weight 202 lb (91.627 kg).  Blood pressure, right arm, sitting is 118/68. Blood pressure, right arm, standing is 122/58.  Physical Exam  General: The patient is alert and cooperative at the time of the examination.The patient is moderately obese.  Eyes: Pupils are equal, round, and reactive to light. Discs are flat  bilaterally.  Neck: The neck is supple, bilateral heart murmur radiation into the carotids is noted, left greater than right. Tympanic membranes are clear bilaterally.  Respiratory: The respiratory examination is clear.  Cardiovascular: The cardiovascular examination reveals a regular rate and rhythm, a grade 2/6 systolic ejection murmur at the aortic area is noted.  Skin: Extremities are with 1+ edema at the ankles bilaterally.  Neurologic Exam  Mental status: The Mini-Mental status examination done today shows a total score 22/30.  Cranial nerves: Facial symmetry is present. There is good sensation of the face to pinprick and soft touch bilaterally. The strength of the facial muscles and the muscles to head turning and shoulder shrug are normal bilaterally. Speech is well enunciated, no aphasia or dysarthria is noted. Extraocular movements are full. Visual fields are full. The tongue is midline, and the patient has symmetric elevation of the soft palate. No obvious hearing deficits are noted.  Motor: The motor testing reveals 5 over 5 strength of all 4 extremities. Good symmetric motor tone is noted throughout.  Sensory: Sensory testing is intact to pinprick, soft touch, vibration sensation, and position sense on all 4 extremities. No evidence of extinction is noted.  Coordination: Cerebellar testing reveals good finger-nose-finger and heel-to-shin bilaterally.  Gait and station: Gait is normal. Tandem gait is slightly unsteady. Romberg is negative. No drift is seen.  Reflexes: Deep tendon  reflexes are symmetric and normal bilaterally, with exception that the ankle jerk reflexes are depressed bilaterally. Toes are downgoing bilaterally.   CTA head and neck 07/18/13:  IMPRESSION:  Atrophy and chronic microvascular ischemia.  Moderate stenosis in the cavernous carotid but laterally due to  atherosclerotic disease. Mild stenosis of the distal vertebral  artery bilaterally.  Calcified plaque throughout the carotid bifurcation bilaterally  without significant carotid stenosis    Assessment/Plan:  One. Dizziness  2. Chronic daily headache  3. Reported memory disturbance  The patient has had a relatively sudden onset of dizziness that has gradually improved over the last several weeks. The patient is not quite back to baseline, but his gait stability as much better. The time course suggests cerebrovascular disease, but this cannot be confirmed by CT scan of the brain. The patient is unable to obtain MRI evaluation of the head. The patient had a CT angiogram evaluation of the head and neck. We will check a brainstem auditory evoked response test. The patient will followup if needed. He will remain on low-dose aspirin.  Jill Alexanders MD 08/04/2013 9:08 PM  [facility] [address] [place], [place] [address]  Phone [phone] Fax [phone]

## 2013-08-04 NOTE — Patient Instructions (Signed)

## 2013-08-05 ENCOUNTER — Telehealth: Payer: Self-pay | Admitting: Cardiology

## 2013-08-05 NOTE — Telephone Encounter (Signed)
New problem   Pt was told by Dr [name] to get Dr Radford Pax to give him results of his sleep study test. Pt need to know do he need an appt or can results be given over phone. Please advise pt.

## 2013-08-05 NOTE — Telephone Encounter (Signed)
Patient has a print out of his CPAP monitor activity and would like Dr Radford Pax to review. Advised would have her assist call next week and either have patient come for ov or bring by for review, unsure what Dr Radford Pax prefers. Patient stated that was ok no hurry. Will forward to [place]. CMA

## 2013-08-10 ENCOUNTER — Telehealth: Payer: Self-pay | Admitting: Cardiology

## 2013-08-10 NOTE — Telephone Encounter (Signed)
Walk In Pt Form " Compliance Report" Dropped Off Danielle back On Thursday 5.28

## 2013-08-11 ENCOUNTER — Ambulatory Visit (INDEPENDENT_AMBULATORY_CARE_PROVIDER_SITE_OTHER): Payer: PRIVATE HEALTH INSURANCE

## 2013-08-11 ENCOUNTER — Telehealth: Payer: Self-pay | Admitting: Neurology

## 2013-08-11 DIAGNOSIS — R42 Dizziness and giddiness: Secondary | ICD-10-CM

## 2013-08-11 NOTE — Procedures (Signed)
    History:  [name] is a 79 year old gentleman with a history of dizziness. He reports some tinnitus and decreased hearing in the left ear. The patient being evaluated for the dizziness.  Description: The brainstem auditory evoked response test was performed today using 90 dB rarefraction clicks in the ipsilateral ear and 40 dB masking noise in the contralateral ear. The absolute latencies for waveforms I, III, and V were prolonged bilaterally. The interpeak latencies for waveforms I-III, III-V, and I-V were within normal limits bilaterally. The amplitudes of waveforms I and V were within normal limits bilaterally.  Impression:  The brainstem auditory evoked response test done today showed prolongation of the absolute latencies for waveforms 1, 3, and 5 bilaterally. This suggests peripheral slowing bilaterally. The study does not show evidence of central slowing on either side.

## 2013-08-11 NOTE — Telephone Encounter (Signed)
I called patient. The brainstem auditory evoked response test shows peripheral slowing bilaterally, no central slowing. The dizziness issue may be related to a peripheral cause. I discussed this with him.

## 2013-08-17 NOTE — Telephone Encounter (Signed)
See download. Pt aware of Bipap DL results.

## 2013-09-13 ENCOUNTER — Encounter: Payer: Self-pay | Admitting: Cardiology

## 2013-09-14 ENCOUNTER — Encounter: Payer: Self-pay | Admitting: Internal Medicine

## 2013-09-29 ENCOUNTER — Other Ambulatory Visit: Payer: Self-pay | Admitting: *Deleted

## 2013-09-29 MED ORDER — TRIAMTERENE-HCTZ 37.5-25 MG PO CAPS: ORAL_CAPSULE | ORAL | Status: DC

## 2013-09-29 NOTE — Telephone Encounter (Signed)
Rx refill sent to patient pharmacy   

## 2013-10-31 ENCOUNTER — Encounter: Payer: PRIVATE HEALTH INSURANCE | Admitting: *Deleted

## 2013-11-09 ENCOUNTER — Encounter: Payer: Self-pay | Admitting: Cardiovascular Disease

## 2013-12-11 ENCOUNTER — Other Ambulatory Visit: Payer: Self-pay | Admitting: Internal Medicine

## 2014-01-11 ENCOUNTER — Other Ambulatory Visit (HOSPITAL_COMMUNITY): Payer: Self-pay | Admitting: Cardiovascular Disease

## 2014-01-11 DIAGNOSIS — I6529 Occlusion and stenosis of unspecified carotid artery: Secondary | ICD-10-CM

## 2014-01-13 ENCOUNTER — Encounter (HOSPITAL_COMMUNITY): Payer: Self-pay | Admitting: *Deleted

## 2014-02-01 ENCOUNTER — Encounter: Payer: Self-pay | Admitting: Neurology

## 2014-02-06 ENCOUNTER — Ambulatory Visit (HOSPITAL_COMMUNITY)
Admission: RE | Admit: 2014-02-06 | Discharge: 2014-02-06 | Disposition: A | Discharge status: Home / Self Care | Payer: PRIVATE HEALTH INSURANCE | Source: Ambulatory Visit | Attending: Cardiology | Admitting: Cardiology

## 2014-02-06 ENCOUNTER — Ambulatory Visit (HOSPITAL_BASED_OUTPATIENT_CLINIC_OR_DEPARTMENT_OTHER)
Admission: RE | Admit: 2014-02-06 | Discharge: 2014-02-06 | Disposition: A | Discharge status: Home / Self Care | Payer: PRIVATE HEALTH INSURANCE | Source: Ambulatory Visit | Attending: Cardiology | Admitting: Cardiology

## 2014-02-06 DIAGNOSIS — I714 Abdominal aortic aneurysm, without rupture, unspecified: Secondary | ICD-10-CM

## 2014-02-06 DIAGNOSIS — I672 Cerebral atherosclerosis: Secondary | ICD-10-CM | POA: Diagnosis present

## 2014-02-06 DIAGNOSIS — I6529 Occlusion and stenosis of unspecified carotid artery: Secondary | ICD-10-CM

## 2014-02-06 NOTE — Progress Notes (Signed)
Carotid Duplex Completed. Jolynn Bajorek, BS, RDMS, RVT  

## 2014-02-06 NOTE — Progress Notes (Signed)
Abdominal Aorta Completed. Oda Cogan, BS, RDMS, RVT

## 2014-02-07 ENCOUNTER — Encounter: Payer: Self-pay | Admitting: Neurology

## 2014-02-08 ENCOUNTER — Other Ambulatory Visit: Payer: Self-pay | Admitting: Gastroenterology

## 2014-02-13 ENCOUNTER — Telehealth: Payer: Self-pay | Admitting: *Deleted

## 2014-02-13 DIAGNOSIS — I714 Abdominal aortic aneurysm, without rupture, unspecified: Secondary | ICD-10-CM

## 2014-02-13 NOTE — Telephone Encounter (Signed)
Results of Carotid doppler and abdominal aorta called.  Order placed for repeat abdominal aorta in one year.  Patient voiced understanding.

## 2014-02-13 NOTE — Telephone Encounter (Signed)
-----   Message from Sanda Klein, MD sent at 02/09/2014  8:35 AM EST ----- Carotids look good, no need to repeat. Moderate size AAA, not changed, repeat in 12 months

## 2014-02-15 ENCOUNTER — Encounter: Payer: Self-pay | Admitting: Internal Medicine

## 2014-02-15 ENCOUNTER — Ambulatory Visit (INDEPENDENT_AMBULATORY_CARE_PROVIDER_SITE_OTHER): Payer: PRIVATE HEALTH INSURANCE | Admitting: Internal Medicine

## 2014-02-15 VITALS — BP 148/68 | HR 59 | Ht 66.0 in | Wt 194.0 lb

## 2014-02-15 DIAGNOSIS — I495 Sick sinus syndrome: Secondary | ICD-10-CM

## 2014-02-15 DIAGNOSIS — Z45018 Encounter for adjustment and management of other part of cardiac pacemaker: Secondary | ICD-10-CM

## 2014-02-15 DIAGNOSIS — R0789 Other chest pain: Secondary | ICD-10-CM

## 2014-02-15 DIAGNOSIS — I480 Paroxysmal atrial fibrillation: Secondary | ICD-10-CM

## 2014-02-15 MED ORDER — TRIAMTERENE-HCTZ 37.5-25 MG PO CAPS: ORAL_CAPSULE | ORAL | Status: DC

## 2014-02-15 NOTE — Progress Notes (Signed)
Patient Care Team: [name], MD as PCP - General (Family Medicine) Deboraha Sprang, MD as Consulting Physician (Cardiology)   HPI  [name] is a 79 y.o. male Seen in follow-up for pacemaker implanted remotely with generator replacement 2011. 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  He has a history of coronary artery disease with prior bypass grafting Myoview scanning 7/14 demonstrated EF 56%   Dizziness was a problem in April 2015 ) neurology for this.  So specific diagnosis was made  Records were reviewed  This has improved   He has also complains of chest discomfort.  This is been progressive over the last 6 months. It is variably associated with exertion. It is not accompanied by dyspnea. He has no complaints of dyspnea on exertion.  Echocardiogram 4/15 demonstrated a mean gradient of 24 mm across his aortic valve  Past Medical History  Diagnosis Date  . CAD (coronary artery disease)     a. s/p MI/PTCA - Cx 1994. b. s/p CABG x5 in 1997. c. Abnl nuc 2003- occ SVG-RCA, patent seq SVG-OM1-dCxOM, patentl LIMA-LAD, patent, SVG-diag. d. Normal nuc 09/2012.  [name] PAF (paroxysmal atrial fibrillation)     a. Remote per records.   . Syncope 11/28/97  . AAA (abdominal aortic aneurysm)     a. Last duplex - 3.8cm 01/2013 - due 01/2014.  . OSA on CPAP   . H/O prostate cancer     a. s/p radical retropubic prostatectomy with bilateral pelvic lymph node dissection  . Aortic stenosis, mild     Last echo 05/04/12 +LVH  . Back pain     hx epidural injections  . Acne rosacea   . H/O: GI bleed     mild, neg. colonoscopy  . Hyperlipidemia   . Thrombocytopenia     chronic, mild  . GERD (gastroesophageal reflux disease)   . COPD (chronic obstructive pulmonary disease)   . HTN (hypertension)   . Baker's cyst     Lt.  [name] PVD (peripheral vascular disease)     a. Rt ext iliac stenosis, last PV cath 2003, moderate stenosis last  Lower Ext Dopplers 12/16/11 - Rt. ABI 0.96  Lt ABI 1.0.  . Pacemaker -[place] with change out 2006  . Sinus node dysfunction 01/20/2013  . Hypertensive heart disease   . Sick sinus syndrome     a. placed 1999, gen change 2011 - [employer]. Jude.  . Valvular heart disease     a. Echo 04/2012: mild-mod AS, mild AI, mild MR.  . Carotid artery disease     a. Duplex 01/2013: mildly abnormal. Mild plaque without significant diameter reduction. Repeat recommended 11/15.  [name] HOH (hard of hearing)   . Dizziness and giddiness 08/04/2013    Past Surgical History  Procedure Laterality Date  . Coronary artery bypass graft  1997    LIMA-LAD; VG-diag; seq VG-1st OM & distal LCX; VG-RCA  . Pacemaker insertion  11/28/97    pacesetter--ERI 2011  . Pacemaker generator change  08/15/2009    St. Jude accent  . Suprapubic prostatectomy    . Cardiac catheterization  2003    VG to RCA occluded, other grafts patent  . Coronary angioplasty  03/29/92    PTCA to LCX  . Cataracts    . Cataract extraction Bilateral     Current Outpatient Prescriptions  Medication Sig Dispense Refill  . omeprazole (PRILOSEC) 40 MG capsule Take  1 capsule by mouth 2 (two) times daily.  3  . aspirin EC 81 MG tablet Take 81 mg by mouth every evening.    . Celecoxib (CELEBREX PO) Take 1 tablet by mouth 2 (two) times daily.    . fluocinonide (LIDEX) 0.05 % external solution Apply 1 application topically 2 (two) times daily.    . hydrocortisone 2.5 % cream Apply 1 application topically 2 (two) times daily as needed (rash).    . metoprolol tartrate (LOPRESSOR) 25 MG tablet Take 0.5 tablets (12.5 mg total) by mouth 2 (two) times daily.    . metoprolol tartrate (LOPRESSOR) 25 MG tablet Take 0.5 tablets (12.5 mg total) by mouth 2 (two) times daily. 90 tablet 0  . naproxen (NAPROSYN) 500 MG tablet     . NITROSTAT 0.4 MG SL tablet Place 0.4 mg under the tongue as needed.    . Omega-3 Fatty Acids (FISH OIL) 1200 MG CAPS Take 1,200 mg by  mouth every evening.    . ranitidine (ZANTAC) 150 MG tablet Take 150 mg by mouth daily as needed for heartburn.    . simvastatin (ZOCOR) 80 MG tablet TAKE 1 TABLET (80 MG TOTAL) BY MOUTH AT BEDTIME. 90 tablet 0  . temazepam (RESTORIL) 30 MG capsule     . traZODone (DESYREL) 150 MG tablet Take by mouth at bedtime.    . triamterene-hydrochlorothiazide (DYAZIDE) 37.5-25 MG per capsule Take 1 capsule by mouth daily - Monday through Friday 30 capsule 4  . vitamin C (ASCORBIC ACID) 500 MG tablet Take 500 mg by mouth every evening.     No current facility-administered medications for this visit.    No Known Allergies  Review of Systems negative except from HPI and PMH  Physical Exam BP 148/68 mmHg  Pulse 59  Ht 5\' 6"  (1.676 m)  Wt 194 lb (87.998 kg)  BMI 31.33 kg/m2 Well developed and well nourished in no acute distress HENT normal E scleral and icterus clear Neck Supple JVP flat; carotids brisk and full Clear to ausculation No chest wall tenderness or increasing discomfort with forced maneuvers of his left arm  Regular rate and rhythm, 3/6 systolic murmur Soft with active bowel sounds No clubbing cyanosis Trace Edema Alert and oriented, grossly normal motor and sensory function Skin Warm and Dry  ECG demonstrates sinus rhythm at 59 Intervals 20/10/44 Nonspecific ST changes  Assessment and  Plan  Aortic stenosis-moderate  Coronary artery disease with prior bypass grafting  Chest discomfort-precordial  Pacemaker-[employer]. Jude [name] device was interrogated.  The information was reviewed. No changes were made in the programming.     Sinus node dysfunction  Paroxysmal atrial fibrillation.  The patient has had no intercurrent paroxysmal atrial fibrillation as identified by his device.  His chest discomfort is concerning. It is unlikely that his aortic valve has become progressively stenotic to contribute to this This  will need follow-up on her next year however. I am  more concerned that he has progressive coronary disease given the nature of his discomfort. We will undertake Myoview scanning.  Heart rate excursion is supported by his pacemaker

## 2014-02-15 NOTE — Patient Instructions (Addendum)
Your physician recommends that you continue on your current medications as directed. Please refer to the Current Medication list given to you today.  Your physician has requested that you have a lexiscan myoview. For further information please visit [email or web address]. Please follow instruction sheet, as given.  Remote monitoring is used to monitor your Pacemaker of ICD from home. This monitoring reduces the number of office visits required to check your device to one time per year. It allows [place] to keep an eye on the functioning of your device to ensure it is working properly. You are scheduled for a device check from home on 05/17/14. You may send your transmission at any time that day. If you have a wireless device, the transmission will be sent automatically. After your physician reviews your transmission, you will receive a postcard with your next transmission date.  Your physician wants you to follow-up in: 1 year with Dr. [name].  You will receive a reminder letter in the mail two months in advance. If you don't receive a letter, please call our office to schedule the follow-up appointment.

## 2014-02-16 ENCOUNTER — Ambulatory Visit (HOSPITAL_COMMUNITY): Payer: PRIVATE HEALTH INSURANCE | Attending: Cardiology | Admitting: Radiology

## 2014-02-16 DIAGNOSIS — I2510 Atherosclerotic heart disease of native coronary artery without angina pectoris: Secondary | ICD-10-CM | POA: Insufficient documentation

## 2014-02-16 DIAGNOSIS — I10 Essential (primary) hypertension: Secondary | ICD-10-CM | POA: Insufficient documentation

## 2014-02-16 DIAGNOSIS — R079 Chest pain, unspecified: Secondary | ICD-10-CM | POA: Diagnosis present

## 2014-02-16 DIAGNOSIS — R0789 Other chest pain: Secondary | ICD-10-CM

## 2014-02-16 DIAGNOSIS — J449 Chronic obstructive pulmonary disease, unspecified: Secondary | ICD-10-CM | POA: Diagnosis not present

## 2014-02-16 DIAGNOSIS — R0602 Shortness of breath: Secondary | ICD-10-CM | POA: Insufficient documentation

## 2014-02-16 DIAGNOSIS — I4891 Unspecified atrial fibrillation: Secondary | ICD-10-CM | POA: Insufficient documentation

## 2014-02-16 DIAGNOSIS — I480 Paroxysmal atrial fibrillation: Secondary | ICD-10-CM

## 2014-02-16 LAB — MDC_IDC_ENUM_SESS_TYPE_INCLINIC
Battery Remaining Longevity: 140.4 mo
Battery Voltage: 2.96 V
Brady Statistic RA Percent Paced: 25 %
Brady Statistic RV Percent Paced: 0.09 %
Date Time Interrogation Session: 20151202171334
Lead Channel Impedance Value: 362.5 Ohm
Lead Channel Pacing Threshold Amplitude: 0.5 V
Lead Channel Pacing Threshold Amplitude: 0.5 V
Lead Channel Pacing Threshold Pulse Width: 0.4 ms
Lead Channel Pacing Threshold Pulse Width: 0.7 ms
Lead Channel Sensing Intrinsic Amplitude: 12 mV
Lead Channel Setting Pacing Amplitude: 1.875
MDC IDC MSMT LEADCHNL RA PACING THRESHOLD PULSEWIDTH: 0.4 ms
MDC IDC MSMT LEADCHNL RA SENSING INTR AMPL: 0.6 mV
MDC IDC MSMT LEADCHNL RV IMPEDANCE VALUE: 475 Ohm
MDC IDC MSMT LEADCHNL RV PACING THRESHOLD AMPLITUDE: 1.625 V
MDC IDC PG SERIAL: 7130714
MDC IDC SET LEADCHNL RA PACING AMPLITUDE: 2 V
MDC IDC SET LEADCHNL RV PACING PULSEWIDTH: 0.7 ms
MDC IDC SET LEADCHNL RV SENSING SENSITIVITY: 2 mV

## 2014-02-16 MED ORDER — REGADENOSON 0.4 MG/5ML IV SOLN
0.4000 mg | Freq: Once | INTRAVENOUS | Status: AC
  Administered 2014-02-16: 0.4 mg via INTRAVENOUS

## 2014-02-16 MED ORDER — TECHNETIUM TC 99M SESTAMIBI GENERIC - CARDIOLITE
10.0000 | Freq: Once | INTRAVENOUS | Status: AC | PRN
  Administered 2014-02-16: 10 via INTRAVENOUS

## 2014-02-16 MED ORDER — TECHNETIUM TC 99M SESTAMIBI GENERIC - CARDIOLITE
30.0000 | Freq: Once | INTRAVENOUS | Status: AC | PRN
  Administered 2014-02-16: 30 via INTRAVENOUS

## 2014-02-16 NOTE — Progress Notes (Signed)
[facility] [address] [place], [place] [address] [phone]    Cardiology Nuclear Med Study  [name] is a 79 y.o. male     MRN : [id number]     DOB: 10-08-1934  Procedure Date: 02/16/2014  Nuclear Med Background Indication for Stress Test:  Evaluation for Ischemia and Follow up CAD History:  COPD and CAD, AFIB, 7/14 MPI: EF: 56% PTVP, AAA 3.8 Cardiac Risk Factors: Hypertension  Symptoms:  Chest Pain, DOE and SOB   Nuclear Pre-Procedure Caffeine/Decaff Intake:  None NPO After: 7:00pm   Lungs:  clear O2 Sat: 95% on room air. IV 0.9% NS with Angio Cath:  22g  IV Site: R Hand  IV Started by:  Crissie Figures, RN  Chest Size (in):  46 Cup Size: n/a  Height: 5\' 6"  (1.676 m)  Weight:  194 lb (87.998 kg)  BMI:  Body mass index is 31.33 kg/(m^2). Tech Comments:  N/A    Nuclear Med Study 1 or 2 day study: 1 day  Stress Test Type:  Lexiscan  Reading MD: N/A  Order Authorizing Provider:  Jolyn Nap, MD  Resting Radionuclide: Technetium 20m Sestamibi  Resting Radionuclide Dose: 11.0 mCi   Stress Radionuclide:  Technetium 81m Sestamibi  Stress Radionuclide Dose: 33.0 mCi           Stress Protocol Rest HR: 55 Stress HR: 67  Rest BP: 144/72 Stress BP: 163/79  Exercise Time (min): n/a METS: n/a   Predicted Max HR: 143 bpm % Max HR: 46.85 bpm Rate Pressure Product: 10921   Dose of Adenosine (mg):  n/a Dose of Lexiscan: 0.4 mg  Dose of Atropine (mg): n/a Dose of Dobutamine: n/a mcg/kg/min (at max HR)  Stress Test Technologist: Perrin Maltese, EMT-P  Nuclear Technologist:  Earl Many, CNMT     Rest Procedure:  Myocardial perfusion imaging was performed at rest 45 minutes following the intravenous administration of Technetium 76m Sestamibi. Rest ECG: Atrial paced rhythm with native ventricular response, 55 bpm, nonspecific ST-T wave changes  Stress Procedure:  The patient received IV Lexiscan 0.4 mg over 15-seconds.  Technetium 4m  Sestamibi injected at 30-seconds. This patient had sob, nausea, and abdominal pain with the Lexiscan injection. Quantitative spect images were obtained after a 45 minute delay. Stress ECG: No significant change from baseline ECG  QPS Raw Data Images:  Mild diaphragmatic attenuation.  Normal left ventricular size. Stress Images:  There is a small sized fixed apical defect mild in severity seen at both rest and stress. There is also mild decreased uptake along the inferior/inferoseptal wall seen at both rest and stress likely secondary to diaphragmatic attenuation. No ischemia identified. Otherwise there was homogeneous radiotracer uptake. Rest Images:  As above Subtraction (SDS):  No evidence of ischemia. Transient Ischemic Dilatation (Normal <1.22):  0.98 Lung/Heart Ratio (Normal <0.45):  0.24  Quantitative Gated Spect Images QGS EDV:  123 ml QGS ESV:  53 ml  Impression Exercise Capacity:  Lexiscan with no exercise. BP Response:  Normal blood pressure response. Clinical Symptoms:  Typical symptoms with Lexiscan ECG Impression:  No significant ST segment change suggestive of ischemia. Comparison with Prior Nuclear Study: No images to compare  Overall Impression:  Low risk stress nuclear study No areas of significant ischemia identified. Diaphragmatic attenuation noted decreasing sensitivity of study..  LV Ejection Fraction: 57%.  LV Wall Motion:  Post bypass septal wall hypokinesis noted.  Candee Furbish, MD

## 2014-03-11 ENCOUNTER — Other Ambulatory Visit: Payer: Self-pay | Admitting: Internal Medicine

## 2014-03-12 ENCOUNTER — Other Ambulatory Visit: Payer: Self-pay | Admitting: Internal Medicine

## 2014-03-13 NOTE — Telephone Encounter (Signed)
Rx(s) sent to pharmacy electronically.  

## 2014-04-17 ENCOUNTER — Other Ambulatory Visit: Payer: Self-pay | Admitting: Cardiovascular Disease

## 2014-04-17 NOTE — Telephone Encounter (Signed)
Rx refill sent to patient pharmacy   

## 2014-05-17 ENCOUNTER — Ambulatory Visit (INDEPENDENT_AMBULATORY_CARE_PROVIDER_SITE_OTHER): Payer: Medicare Other | Admitting: *Deleted

## 2014-05-17 DIAGNOSIS — I495 Sick sinus syndrome: Secondary | ICD-10-CM

## 2014-05-17 LAB — MDC_IDC_ENUM_SESS_TYPE_REMOTE
Battery Remaining Percentage: 74 %
Battery Voltage: 2.95 V
Brady Statistic AS VP Percent: 1 %
Brady Statistic AS VS Percent: 70 %
Brady Statistic RA Percent Paced: 29 %
Date Time Interrogation Session: 20160302102826
Lead Channel Pacing Threshold Amplitude: 1.75 V
Lead Channel Pacing Threshold Pulse Width: 0.7 ms
Lead Channel Sensing Intrinsic Amplitude: 0.6 mV
Lead Channel Setting Pacing Amplitude: 2 V
Lead Channel Setting Pacing Pulse Width: 0.7 ms
Lead Channel Setting Sensing Sensitivity: 2 mV
MDC IDC MSMT BATTERY REMAINING LONGEVITY: 95 mo
MDC IDC MSMT LEADCHNL RA IMPEDANCE VALUE: 360 Ohm
MDC IDC MSMT LEADCHNL RV IMPEDANCE VALUE: 480 Ohm
MDC IDC MSMT LEADCHNL RV SENSING INTR AMPL: 9.9 mV
MDC IDC PG SERIAL: 7130714
MDC IDC SET LEADCHNL RA PACING AMPLITUDE: 2 V
MDC IDC STAT BRADY AP VP PERCENT: 1 %
MDC IDC STAT BRADY AP VS PERCENT: 29 %
MDC IDC STAT BRADY RV PERCENT PACED: 1 %

## 2014-05-17 NOTE — Progress Notes (Signed)
Remote pacemaker transmission.   

## 2014-06-14 ENCOUNTER — Other Ambulatory Visit: Payer: Self-pay | Admitting: Internal Medicine

## 2014-06-20 ENCOUNTER — Encounter: Payer: Self-pay | Admitting: Cardiology

## 2014-06-26 ENCOUNTER — Encounter: Payer: Self-pay | Admitting: Internal Medicine

## 2014-07-03 ENCOUNTER — Other Ambulatory Visit: Payer: Self-pay | Admitting: *Deleted

## 2014-07-03 MED ORDER — NITROGLYCERIN 0.4 MG SL SUBL: 0.4000 mg | SUBLINGUAL_TABLET | SUBLINGUAL | Status: DC | PRN

## 2014-08-21 ENCOUNTER — Ambulatory Visit (INDEPENDENT_AMBULATORY_CARE_PROVIDER_SITE_OTHER): Payer: Medicare Other | Admitting: *Deleted

## 2014-08-21 ENCOUNTER — Telehealth: Payer: Self-pay | Admitting: Cardiology

## 2014-08-21 DIAGNOSIS — I495 Sick sinus syndrome: Secondary | ICD-10-CM

## 2014-08-21 NOTE — Telephone Encounter (Signed)
LMOVM reminding pt to send remote transmission.   

## 2014-08-22 NOTE — Progress Notes (Signed)
Remote pacemaker transmission.   

## 2014-08-26 LAB — CUP PACEART REMOTE DEVICE CHECK
Battery Remaining Percentage: 74 %
Battery Voltage: 2.95 V
Brady Statistic RV Percent Paced: 1 % — CL
Date Time Interrogation Session: 20160611121235
Lead Channel Impedance Value: 360 Ohm
Lead Channel Impedance Value: 480 Ohm
Lead Channel Pacing Threshold Amplitude: 1.125 V
Lead Channel Pacing Threshold Pulse Width: 0.7 ms
Lead Channel Setting Sensing Sensitivity: 2 mV
MDC IDC MSMT LEADCHNL RA SENSING INTR AMPL: 0.6 mV
MDC IDC MSMT LEADCHNL RV SENSING INTR AMPL: 11 mV
MDC IDC PG SERIAL: 7130714
MDC IDC SET LEADCHNL RA PACING AMPLITUDE: 2 V
MDC IDC SET LEADCHNL RV PACING PULSEWIDTH: 0.7 ms
MDC IDC STAT BRADY RA PERCENT PACED: 24 %

## 2014-09-06 ENCOUNTER — Encounter: Payer: Self-pay | Admitting: Cardiology

## 2014-09-07 ENCOUNTER — Other Ambulatory Visit: Payer: Self-pay | Admitting: Internal Medicine

## 2014-09-19 ENCOUNTER — Encounter: Payer: Self-pay | Admitting: Internal Medicine

## 2014-11-21 ENCOUNTER — Telehealth: Payer: Self-pay | Admitting: Cardiology

## 2014-11-21 ENCOUNTER — Ambulatory Visit (INDEPENDENT_AMBULATORY_CARE_PROVIDER_SITE_OTHER): Payer: Medicare Other | Admitting: *Deleted

## 2014-11-21 DIAGNOSIS — I495 Sick sinus syndrome: Secondary | ICD-10-CM

## 2014-11-21 NOTE — Telephone Encounter (Signed)
LMOVM reminding pt to send remote transmission.   

## 2014-11-24 NOTE — Progress Notes (Signed)
Remote pacemaker transmission.   

## 2014-12-01 LAB — CUP PACEART REMOTE DEVICE CHECK
Battery Remaining Longevity: 115 mo
Battery Remaining Percentage: 91 %
Battery Voltage: 2.95 V
Brady Statistic RA Percent Paced: 19 %
Lead Channel Impedance Value: 350 Ohm
Lead Channel Pacing Threshold Amplitude: 0.5 V
Lead Channel Pacing Threshold Pulse Width: 0.4 ms
Lead Channel Setting Sensing Sensitivity: 2 mV
MDC IDC MSMT LEADCHNL RA SENSING INTR AMPL: 0.4 mV
MDC IDC MSMT LEADCHNL RV IMPEDANCE VALUE: 480 Ohm
MDC IDC MSMT LEADCHNL RV PACING THRESHOLD AMPLITUDE: 1.75 V
MDC IDC MSMT LEADCHNL RV PACING THRESHOLD PULSEWIDTH: 0.7 ms
MDC IDC MSMT LEADCHNL RV SENSING INTR AMPL: 12 mV
MDC IDC SESS DTM: 20160906201438
MDC IDC SET LEADCHNL RA PACING AMPLITUDE: 2 V
MDC IDC SET LEADCHNL RV PACING AMPLITUDE: 2 V
MDC IDC SET LEADCHNL RV PACING PULSEWIDTH: 0.7 ms
MDC IDC STAT BRADY AP VP PERCENT: 1 %
MDC IDC STAT BRADY AP VS PERCENT: 19 %
MDC IDC STAT BRADY AS VP PERCENT: 1 %
MDC IDC STAT BRADY AS VS PERCENT: 80 %
MDC IDC STAT BRADY RV PERCENT PACED: 1 %
Pulse Gen Serial Number: 7130714

## 2014-12-27 ENCOUNTER — Encounter: Payer: Self-pay | Admitting: Cardiology

## 2015-01-08 ENCOUNTER — Encounter: Payer: Self-pay | Admitting: Internal Medicine

## 2015-02-05 ENCOUNTER — Other Ambulatory Visit: Payer: Self-pay | Admitting: Cardiovascular Disease

## 2015-02-05 DIAGNOSIS — I714 Abdominal aortic aneurysm, without rupture, unspecified: Secondary | ICD-10-CM

## 2015-02-07 ENCOUNTER — Ambulatory Visit (HOSPITAL_COMMUNITY)
Admission: RE | Admit: 2015-02-07 | Discharge: 2015-02-07 | Disposition: A | Discharge status: Home / Self Care | Payer: Medicare Other | Source: Ambulatory Visit | Attending: Cardiovascular Disease | Admitting: Cardiovascular Disease

## 2015-02-07 DIAGNOSIS — I714 Abdominal aortic aneurysm, without rupture, unspecified: Secondary | ICD-10-CM

## 2015-02-07 DIAGNOSIS — I700 Atherosclerosis of aorta: Secondary | ICD-10-CM | POA: Insufficient documentation

## 2015-02-07 DIAGNOSIS — I119 Hypertensive heart disease without heart failure: Secondary | ICD-10-CM | POA: Diagnosis not present

## 2015-02-07 DIAGNOSIS — E785 Hyperlipidemia, unspecified: Secondary | ICD-10-CM | POA: Insufficient documentation

## 2015-02-12 ENCOUNTER — Telehealth: Payer: Self-pay | Admitting: *Deleted

## 2015-02-12 DIAGNOSIS — I714 Abdominal aortic aneurysm, without rupture, unspecified: Secondary | ICD-10-CM

## 2015-02-12 NOTE — Telephone Encounter (Signed)
Abdominal U/S results called to patient.  Order placed for repeat in one year.  Patient voiced understanding.

## 2015-02-12 NOTE — Telephone Encounter (Signed)
-----   Message from Sanda Klein, MD sent at 02/12/2015 10:04 AM EST ----- Stable size of small AAA, recheck in one year

## 2015-03-14 ENCOUNTER — Encounter: Payer: Self-pay | Admitting: Internal Medicine

## 2015-03-14 ENCOUNTER — Ambulatory Visit (INDEPENDENT_AMBULATORY_CARE_PROVIDER_SITE_OTHER): Payer: Medicare Other | Admitting: Internal Medicine

## 2015-03-14 VITALS — BP 128/78 | HR 56 | Ht 67.0 in | Wt 200.4 lb

## 2015-03-14 DIAGNOSIS — I350 Nonrheumatic aortic (valve) stenosis: Secondary | ICD-10-CM | POA: Diagnosis not present

## 2015-03-14 DIAGNOSIS — I495 Sick sinus syndrome: Secondary | ICD-10-CM

## 2015-03-14 DIAGNOSIS — I480 Paroxysmal atrial fibrillation: Secondary | ICD-10-CM | POA: Diagnosis not present

## 2015-03-14 DIAGNOSIS — Z950 Presence of cardiac pacemaker: Secondary | ICD-10-CM

## 2015-03-14 LAB — CUP PACEART INCLINIC DEVICE CHECK
Battery Voltage: 2.93 V
Brady Statistic RV Percent Paced: 0.14 %
Implantable Lead Implant Date: 19990914
Lead Channel Impedance Value: 400 Ohm
Lead Channel Impedance Value: 512.5 Ohm
Lead Channel Pacing Threshold Amplitude: 0.5 V
Lead Channel Pacing Threshold Amplitude: 1.5 V
Lead Channel Pacing Threshold Pulse Width: 0.4 ms
Lead Channel Pacing Threshold Pulse Width: 0.7 ms
Lead Channel Setting Pacing Amplitude: 2 V
Lead Channel Setting Sensing Sensitivity: 2 mV
MDC IDC LEAD IMPLANT DT: 19990914
MDC IDC LEAD LOCATION: 753859
MDC IDC LEAD LOCATION: 753860
MDC IDC MSMT BATTERY REMAINING LONGEVITY: 117.6
MDC IDC MSMT LEADCHNL RA SENSING INTR AMPL: 0.5 mV
MDC IDC MSMT LEADCHNL RV SENSING INTR AMPL: 12 mV
MDC IDC SESS DTM: 20161228120406
MDC IDC SET LEADCHNL RV PACING AMPLITUDE: 1.75 V
MDC IDC SET LEADCHNL RV PACING PULSEWIDTH: 0.7 ms
MDC IDC STAT BRADY RA PERCENT PACED: 20 %
Pulse Gen Serial Number: 7130714

## 2015-03-14 NOTE — Patient Instructions (Signed)
Medication Instructions: - no changes  Labwork: - none  Procedures/Testing: - Your physician has requested that you have an echocardiogram. Echocardiography is a painless test that uses sound waves to create images of your heart. It provides your doctor with information about the size and shape of your heart and how well your heart's chambers and valves are working. This procedure takes approximately one hour. There are no restrictions for this procedure.  Follow-Up: - Remote monitoring is used to monitor your Pacemaker of ICD from home. This monitoring reduces the number of office visits required to check your device to one time per year. It allows [place] to keep an eye on the functioning of your device to ensure it is working properly. You are scheduled for a device check from home on 06/13/15. You may send your transmission at any time that day. If you have a wireless device, the transmission will be sent automatically. After your physician reviews your transmission, you will receive a postcard with your next transmission date.  - Your physician wants you to follow-up in: 1 year with Dr. [name]. You will receive a reminder letter in the mail two months in advance. If you don't receive a letter, please call our office to schedule the follow-up appointment.  Any Additional Special Instructions Will Be Listed Below (If Applicable).

## 2015-03-14 NOTE — Progress Notes (Signed)
Patient Care Team: [name], MD as PCP - General (Family Medicine) Deboraha Sprang, MD as Consulting Physician (Cardiology)   HPI  [name] is a 80 y.o. male Seen in follow-up for pacemaker implanted remotely with generator replacement 2011. 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  He has a history of coronary artery disease with prior bypass grafting Myoview scanning 7/14 demonstrated EF 56% and no ischemia   This was repeated 12/15 4. Atypical chest pain. It was unchanged.   he continues to complain of chest discomfort. He has 3 specific chest pain syndromes. The first is a hard pain" he takes nitroglycerin for. It is unrelated to exertion. It lasts typically an hour or so. It is midsternal. #2- 18 also lasting hours. #3- squeezing which last 1-2 days. It can be aggravated by rolling around in bed or moving his arms.   He has a history of ulcer disease reflux disease and has blood on his stool. He has a remote history of hemorrhoidectomy   He walks vigorously daily. He has some dyspnea on exertion climbing a hill which is stable there is no accompanied chest discomfort.   History history of aortic stenosis. Echocardiogram 4/15 demonstrated a mean gradient of 24 mm across his aortic valve    he has a history of a AAA that was evaluated 11/16   Unfortunately, the software computer while crashing if I try to get the data  But the report was reviewed and it suggested one year follow-up    Past Medical History  Diagnosis Date  . CAD (coronary artery disease)     a. s/p MI/PTCA - Cx 1994. b. s/p CABG x5 in 1997. c. Abnl nuc 2003- occ SVG-RCA, patent seq SVG-OM1-dCxOM, patentl LIMA-LAD, patent, SVG-diag. d. Normal nuc 09/2012.  [name] PAF (paroxysmal atrial fibrillation) ([place])     a. Remote per records.   . Syncope 11/28/97  . AAA (abdominal aortic aneurysm) ([place])     a. Last duplex - 3.8cm 01/2013 - due 01/2014.  . OSA  on CPAP   . H/O prostate cancer     a. s/p radical retropubic prostatectomy with bilateral pelvic lymph node dissection  . Aortic stenosis, mild     Last echo 05/04/12 +LVH  . Back pain     hx epidural injections  . Acne rosacea   . H/O: GI bleed     mild, neg. colonoscopy  . Hyperlipidemia   . Thrombocytopenia (HCC)     chronic, mild  . GERD (gastroesophageal reflux disease)   . COPD (chronic obstructive pulmonary disease) ([place])   . HTN (hypertension)   . Baker's cyst     Lt.  [name] PVD (peripheral vascular disease) ([place])     a. Rt ext iliac stenosis, last PV cath 2003, moderate stenosis last Lower Ext Dopplers 12/16/11 - Rt. ABI 0.96  Lt ABI 1.0.  . Pacemaker -[place] with change out 2006  . Sinus node dysfunction ([place]) 01/20/2013  . Hypertensive heart disease   . Sick sinus syndrome ([place])     a. placed 1999, gen change 2011 - St. Jude.  . Valvular heart disease     a. Echo 04/2012: mild-mod AS, mild AI, mild MR.  . Carotid artery disease ([place])     a. Duplex 01/2013: mildly abnormal. Mild plaque without significant diameter reduction. Repeat recommended 11/15.  [name] HOH (hard of hearing)   . Dizziness and  giddiness 08/04/2013    Past Surgical History  Procedure Laterality Date  . Coronary artery bypass graft  1997    LIMA-LAD; VG-diag; seq VG-1st OM & distal LCX; VG-RCA  . Pacemaker insertion  11/28/97    pacesetter--ERI 2011  . Pacemaker generator change  08/15/2009    St. Jude accent  . Suprapubic prostatectomy    . Cardiac catheterization  2003    VG to RCA occluded, other grafts patent  . Coronary angioplasty  03/29/92    PTCA to LCX  . Cataracts    . Cataract extraction Bilateral     Current Outpatient Prescriptions  Medication Sig Dispense Refill  . aspirin EC 81 MG tablet Take 81 mg by mouth every evening.    . metoprolol tartrate (LOPRESSOR) 25 MG tablet Take 0.5 tablets (12.5 mg total) by mouth 2 (two) times daily.    . nitroGLYCERIN (NITROSTAT) 0.4 MG  SL tablet Place 1 tablet (0.4 mg total) under the tongue as needed. 25 tablet 3  . Omega-3 Fatty Acids (FISH OIL) 1200 MG CAPS Take 1,200 mg by mouth every evening.    . ranitidine (ZANTAC) 150 MG tablet Take 150 mg by mouth daily as needed for heartburn.    . simvastatin (ZOCOR) 80 MG tablet TAKE ONE TABLET BY MOUTH AT BEDTIME 90 tablet 3  . triamterene-hydrochlorothiazide (DYAZIDE) 37.5-25 MG per capsule Take 1 capsule by mouth daily - Monday through Friday 90 capsule 3  . vitamin C (ASCORBIC ACID) 500 MG tablet Take 500 mg by mouth every evening.     No current facility-administered medications for this visit.    No Known Allergies  Review of Systems negative except from HPI and PMH  Physical Exam BP 128/78 mmHg  Pulse 56  Ht 5\' 7"  (1.702 m)  Wt 200 lb 6.4 oz (90.901 kg)  BMI 31.38 kg/m2 Well developed and well nourished in no acute distress HENT normal E scleral and icterus clear Neck Supple JVP flat; carotids  Parvus and tardus Clear to ausculation No chest wall tenderness or increasing discomfort with forced maneuvers of his left arm  Regular rate and rhythm, 3/6 systolic murmur Soft with active bowel sounds No clubbing cyanosis Trace Edema Alert and oriented, grossly normal motor and sensory function Skin Warm and Dry  ECG demonstrates sinus rhythm at 59 Intervals 20/10/44 Nonspecific ST changes  Assessment and  Plan  Aortic stenosis-moderate  Coronary artery disease with prior bypass grafting  Chest discomfort-precordial  Pacemaker-St. Jude [name] device was interrogated.  The information was reviewed. No changes were made in the programming.     Sinus node dysfunction  Paroxysmal atrial fibrillation.  The patient has had no intercurrent paroxysmal atrial fibrillation as identified by his device.  His chest discomfort  I don't think is cardiac  Based on the symptoms as well as his negative Myoview a year ago. I also don't think is related to his  aortic stenosis although this is more concerning as by examination his aortic stenosis is worse. We will check an echocardiogram.  .Heart rate excursion is supported by his pacemaker    he has a history of bloody stools. I suggested he follow-up with his PCP and his gastroenterologist.

## 2015-03-26 ENCOUNTER — Other Ambulatory Visit: Payer: Self-pay

## 2015-03-26 ENCOUNTER — Ambulatory Visit (HOSPITAL_COMMUNITY): Payer: Medicare Other | Attending: Cardiology

## 2015-03-26 DIAGNOSIS — I480 Paroxysmal atrial fibrillation: Secondary | ICD-10-CM | POA: Insufficient documentation

## 2015-03-26 DIAGNOSIS — I350 Nonrheumatic aortic (valve) stenosis: Secondary | ICD-10-CM | POA: Diagnosis present

## 2015-03-26 DIAGNOSIS — I352 Nonrheumatic aortic (valve) stenosis with insufficiency: Secondary | ICD-10-CM | POA: Diagnosis not present

## 2015-03-26 DIAGNOSIS — I495 Sick sinus syndrome: Secondary | ICD-10-CM | POA: Diagnosis not present

## 2015-03-26 DIAGNOSIS — I517 Cardiomegaly: Secondary | ICD-10-CM | POA: Diagnosis not present

## 2015-03-26 DIAGNOSIS — Z950 Presence of cardiac pacemaker: Secondary | ICD-10-CM | POA: Diagnosis not present

## 2015-03-26 DIAGNOSIS — I071 Rheumatic tricuspid insufficiency: Secondary | ICD-10-CM | POA: Insufficient documentation

## 2015-04-06 ENCOUNTER — Telehealth: Payer: Self-pay | Admitting: Internal Medicine

## 2015-04-06 NOTE — Telephone Encounter (Signed)
New message      Calling to let Dr [name] know that no one has called him regarding seeing someone about his bad heart valve.  Please call

## 2015-04-06 NOTE — Telephone Encounter (Signed)
Spoke with pt, Follow up scheduled  

## 2015-04-06 NOTE — Telephone Encounter (Signed)
Patient is extremely anxious that he has not been called to schedule OV with Dr. Sallyanne Kuster. Informed patient a message will be sent to Dr. Victorino December scheduler to call him ASAP. Patient grateful for call back.

## 2015-04-26 ENCOUNTER — Ambulatory Visit (INDEPENDENT_AMBULATORY_CARE_PROVIDER_SITE_OTHER): Payer: Medicare Other | Admitting: Cardiovascular Disease

## 2015-04-26 ENCOUNTER — Other Ambulatory Visit: Payer: Self-pay | Admitting: Cardiovascular Disease

## 2015-04-26 ENCOUNTER — Encounter: Payer: Self-pay | Admitting: Cardiovascular Disease

## 2015-04-26 VITALS — BP 160/72 | HR 70 | Ht 66.0 in | Wt 201.1 lb

## 2015-04-26 DIAGNOSIS — Z79899 Other long term (current) drug therapy: Secondary | ICD-10-CM

## 2015-04-26 DIAGNOSIS — D689 Coagulation defect, unspecified: Secondary | ICD-10-CM

## 2015-04-26 DIAGNOSIS — I2510 Atherosclerotic heart disease of native coronary artery without angina pectoris: Secondary | ICD-10-CM | POA: Diagnosis not present

## 2015-04-26 DIAGNOSIS — I350 Nonrheumatic aortic (valve) stenosis: Secondary | ICD-10-CM | POA: Diagnosis not present

## 2015-04-26 DIAGNOSIS — I714 Abdominal aortic aneurysm, without rupture, unspecified: Secondary | ICD-10-CM

## 2015-04-26 DIAGNOSIS — Z45018 Encounter for adjustment and management of other part of cardiac pacemaker: Secondary | ICD-10-CM

## 2015-04-26 DIAGNOSIS — I739 Peripheral vascular disease, unspecified: Secondary | ICD-10-CM

## 2015-04-26 DIAGNOSIS — I495 Sick sinus syndrome: Secondary | ICD-10-CM

## 2015-04-26 LAB — CBC
HCT: 39.7 % (ref 39.0–52.0)
Hemoglobin: 13.4 g/dL (ref 13.0–17.0)
MCH: 31.8 pg (ref 26.0–34.0)
MCHC: 33.8 g/dL (ref 30.0–36.0)
MCV: 94.1 fL (ref 78.0–100.0)
MPV: 11 fL (ref 8.6–12.4)
PLATELETS: 135 10*3/uL — AB (ref 150–400)
RBC: 4.22 MIL/uL (ref 4.22–5.81)
RDW: 14 % (ref 11.5–15.5)
WBC: 5 10*3/uL (ref 4.0–10.5)

## 2015-04-26 NOTE — Patient Instructions (Signed)
Your physician has requested that you have a RIGHT AND LEFT HEART cardiac catheterization BY DR. Burt Knack. Cardiac catheterization is used to diagnose and/or treat various heart conditions. Doctors may recommend this procedure for a number of different reasons. The most common reason is to evaluate chest pain. Chest pain can be a symptom of coronary artery disease (CAD), and cardiac catheterization can show whether plaque is narrowing or blocking your heart's arteries. This procedure is also used to evaluate the valves, as well as measure the blood flow and oxygen levels in different parts of your heart. For further information please visit HugeFiesta.[email or web address]. Please follow instruction sheet, as given.  Your physician recommends that you return for lab work in: NEEDS TO BE DONE 3-7 DAYS PRIOR TO [facility] CATH

## 2015-04-26 NOTE — Progress Notes (Signed)
Patient ID: [name], male   DOB: [birth year], 81 y.o.   MRN: [id number]    Cardiology Office Note    Date:  04/27/2015   ID:  [name], DOB 1934-09-09, MRN [id number]  PCP:  Curtis Downing, MD  Cardiologist:   Curtis Klein, MD   Chief Complaint  Patient presents with  . New Evaluation    referred by Dr. Caryl Sims; discuss ECHO/aortic stenosis    History of Present Illness:  [name] is a 81 y.o. male with a long-standing history of cardiac problems. Recently he has developed worsening exertional dyspnea as well as a variety of vague, atypical and varying types of chest discomfort. He has been seeing Dr. Caryl Sims for his problems with sinus bradycardia and pacemaker management, following Dr. Lowella Sims retirement.  He describes NYHA functional class II-III exertional dyspnea. His chest pain occurs at rest, varies in quality and location, may last for all day or longer. Denies syncope but has had occasional orthostatic dizziness. He is an active and socially engaged gentleman.  His follow-up echocardiogram performed January 9 shows worsening aortic stenosis with a mean gradient that is now 40 mmHg, peak gradient of 72 mmHg and mild to moderate aortic insufficiency. The calculated aortic valve area was 0.7 cm, but this may be an underestimation. Left ventricular systolic function remains normal and he did not have pulmonary hypertension. The left atrium is mildly dilated  [name] presented with an acute myocardial infarction in 1994 and eventually underwent 5 vessel bypass in 1997. Repeat angiography in 2003 showed interval occlusion of the saphenous vein graft to the right coronary artery but the other bypasses were widely patent (LIMA-LAD, SVG diagonal, sequential SVG to OM1-distal circumflex). He had a normal nuclear perfusion study in 2014.   He has a dual-chamber St. Jude Accent RF permanent pacemaker implanted for symptomatic sinus bradycardia, last checked on March 14, 2015 showing normal device function with estimated 8-10 years of generator longevity, 20% right atrial pacing and less than 1% right ventricular pacing. Underlying rhythm was mild sinus bradycardia at 54 bpm. Current leads were implanted in 1999, the current generator was a change out in 2011.  He has a small fusiform infrarenal abdominal aortic aneurysm with a maximum diameter of 3.6 cm stable by most recent duplex ultrasound November 2016. Has a history of right external iliac stenosis, but his most recent ankle-brachial indices were essentially normal.  He has treated hypertension and obstructive sleep apnea on CPAP. He reports good compliance with the device, but does complain about a facial rash she gets from using the mask.  Past Medical History  Diagnosis Date  . CAD (coronary artery disease)     a. s/p MI/PTCA - Cx 1994. b. s/p CABG x5 in 1997. c. Abnl nuc 2003- occ SVG-RCA, patent seq SVG-OM1-dCxOM, patentl LIMA-LAD, patent, SVG-diag. d. Normal nuc 09/2012.  [name] PAF (paroxysmal atrial fibrillation) ([name])     a. Remote per records.   . Syncope 11/28/97  . AAA (abdominal aortic aneurysm) ([name])     a. Last duplex - 3.8cm 01/2013 - due 01/2014.  . OSA on CPAP   . H/O prostate cancer     a. s/p radical retropubic prostatectomy with bilateral pelvic lymph node dissection  . Aortic stenosis, mild     Last echo 05/04/12 +LVH  . Back pain     hx epidural injections  . Acne rosacea   . H/O: GI bleed     mild, neg. colonoscopy  . Hyperlipidemia   .  Thrombocytopenia ([name])     chronic, mild  . GERD (gastroesophageal reflux disease)   . COPD (chronic obstructive pulmonary disease) ([name])   . HTN (hypertension)   . Baker's cyst     Lt.  [name] PVD (peripheral vascular disease) ([name])     a. Rt ext iliac stenosis, last PV cath 2003, moderate stenosis last Lower Ext Dopplers 12/16/11 - Rt. ABI 0.96  Lt ABI 1.0.  . Pacemaker -[place] with change out 2006  . Sinus node dysfunction  ([name]) 01/20/2013  . Hypertensive heart disease   . Sick sinus syndrome ([place])     a. placed 1999, gen change 2011 - St. Jude.  . Valvular heart disease     a. Echo 04/2012: mild-mod AS, mild AI, mild MR.  . Carotid artery disease ([place])     a. Duplex 01/2013: mildly abnormal. Mild plaque without significant diameter reduction. Repeat recommended 11/15.  [name] HOH (hard of hearing)   . Dizziness and giddiness 08/04/2013    Past Surgical History  Procedure Laterality Date  . Coronary artery bypass graft  1997    LIMA-LAD; VG-diag; seq VG-1st OM & distal LCX; VG-RCA  . Pacemaker insertion  11/28/97    pacesetter--ERI 2011  . Pacemaker generator change  08/15/2009    St. Jude accent  . Suprapubic prostatectomy    . Cardiac catheterization  2003    VG to RCA occluded, other grafts patent  . Coronary angioplasty  03/29/92    PTCA to LCX  . Cataracts    . Cataract extraction Bilateral     Outpatient Prescriptions Prior to Visit  Medication Sig Dispense Refill  . aspirin EC 81 MG tablet Take 81 mg by mouth every evening.    . metoprolol tartrate (LOPRESSOR) 25 MG tablet Take 0.5 tablets (12.5 mg total) by mouth 2 (two) times daily.    . nitroGLYCERIN (NITROSTAT) 0.4 MG SL tablet Place 1 tablet (0.4 mg total) under the tongue as needed. 25 tablet 3  . Omega-3 Fatty Acids (FISH OIL) 1200 MG CAPS Take 1,200 mg by mouth every evening.    . ranitidine (ZANTAC) 150 MG tablet Take 150 mg by mouth daily as needed for heartburn.    . simvastatin (ZOCOR) 80 MG tablet TAKE ONE TABLET BY MOUTH AT BEDTIME 90 tablet 3  . triamterene-hydrochlorothiazide (DYAZIDE) 37.5-25 MG per capsule Take 1 capsule by mouth daily - Monday through Friday 90 capsule 3  . vitamin C (ASCORBIC ACID) 500 MG tablet Take 500 mg by mouth every evening.     No facility-administered medications prior to visit.     Allergies:   Review of patient's allergies indicates no known allergies.   Social History   Social History  . Marital  Status: Widowed    Spouse Name: N/A  . Number of Children: 5  . Years of Education: 7th   Occupational History  . Retired    Social History Main Topics  . Smoking status: Former Smoker -- 2.00 packs/day for 48 years    Types: Cigarettes    Quit date: 03/17/1993  . Smokeless tobacco: Never Used  . Alcohol Use: Yes     Comment: occ  . Drug Use: No  . Sexual Activity: Not Asked   Other Topics Concern  . None   Social History Narrative     Family History:  [name] family history includes Cancer in his brother; Colon cancer in his father; Heart Problems in his  brother and sister.   ROS:   Please see the history of present illness.    ROS All other systems reviewed and are negative.   PHYSICAL EXAM:   VS:  BP 160/72 mmHg  Pulse 70  Ht 5\' 6"  (1.676 m)  Wt 91.218 kg (201 lb 1.6 oz)  BMI 32.47 kg/m2   GEN: Well nourished, well developed, in no acute distress HEENT: normal Neck: no JVD, carotid bruits, or masses. Carotid pulses are not clearly delayed Cardiac: RRR; mid peaking 3/6 systolic murmur heard in the aortic focus and the right lower sternal border, no diastolic murmurs, rubs or gallops,no edema  Respiratory:  clear to auscultation bilaterally, normal work of breathing GI: soft, nontender, nondistended, + BS MS: no deformity or atrophy Skin: warm and dry, no rash Neuro:  Alert and Oriented x 3, Strength and sensation are intact Psych: euthymic mood, full affect  Wt Readings from Last 3 Encounters:  04/26/15 91.218 kg (201 lb 1.6 oz)  03/14/15 90.901 kg (200 lb 6.4 oz)  02/16/14 87.998 kg (194 lb)      Studies/Labs Reviewed:   EKG:  EKG is not ordered today.  The ekg ordered 03/14/2015 demonstrates sinus bradycardia and left ventricular hypertrophy with typical repolarization abnormalities  Recent Labs: 04/26/2015: ALT 21; BUN 16; Creat 1.44*; Hemoglobin 13.4; Platelets 135*; Potassium 4.3; Sodium 140   Lipid Panel    Component Value Date/Time   CHOL  175 01/20/2013 1030   TRIG 167.0* 01/20/2013 1030   HDL 50.10 01/20/2013 1030   CHOLHDL 3 01/20/2013 1030   VLDL 33.4 01/20/2013 1030   LDLCALC 92 01/20/2013 1030    Additional studies/ records that were reviewed today include:  Last cath report, notes from Dr. Caryl Sims    ASSESSMENT:    1. Moderate to severe aortic stenosis   2. Coronary artery disease involving native coronary artery of native heart without angina pectoris   3. Sinus node dysfunction ([name])   4. Pacemaker -[place]   5. HTN  6. Hyperlipidemia   7. PAD/AAA   PLAN:  In order of problems listed above:  1. AS: I think he is describing exertional dyspnea that could well be secondary to the aortic valve stenosis. The mean gradient is definitely substantially higher than it was in the past. His chest discomfort is highly atypical and not exertional. Even if his current symptoms are not directly attributable to aortic stenosis, his peak aortic valve velocity is well over 4 m/second, likely predicting the need for intervention in the next 12 months. His valve is heavily calcified. He is probably not an optimal candidate for redo sternotomy and aortic valve replacement. In fact, he states that he would never go through a procedure like that again since it was so painful and difficult. Discussed the option for TAVR, described the procedure in some detail, pointed out the fact that the workup is lengthy and that the presence of his peripheral arterial problems will have a significant impact on our options. I think the first step in his workup will be right and left heart catheterization and coronary/bypass angiography to reassess the status of his 79 year old grafts and obtain another measurement of his aortic valve stenosis. Will ask Dr. Gerrit Halls to perform this procedure. This procedure has been fully reviewed with the patient and informed consent has been obtained. 2. CAD s/p CABG: I don't think his chest pain is angina. He is on  statin, ASA and beta blocker. 3. Mild sinus bradycardia, he  is not pacemaker dependent 4. Normally functioning dual-chamber permanent pacemaker, somewhat lessens the concern for AV block with TAVR 5. HTN: EP is high today but he reports this is atypical. When he saw Dr. Caryl Sims in December his blood pressure was 128/78 6. HLP: Need to get an updated lipid profile, not checked since 2014 7. PAD/AAA: The documented right external iliac artery stenosis and small fusiform infrarenal abdominal aortic aneurysm need to be reevaluated before TAVR, but are likely not prohibitive problems.    Medication Adjustments/Labs and Tests Ordered: Current medicines are reviewed at length with the patient today.  Concerns regarding medicines are outlined above.  Medication changes, Labs and Tests ordered today are listed in the Patient Instructions below. Patient Instructions  Your physician has requested that you have a RIGHT AND LEFT HEART cardiac catheterization BY DR. Burt Knack. Cardiac catheterization is used to diagnose and/or treat various heart conditions. Doctors may recommend this procedure for a number of different reasons. The most common reason is to evaluate chest pain. Chest pain can be a symptom of coronary artery disease (CAD), and cardiac catheterization can show whether plaque is narrowing or blocking your heart's arteries. This procedure is also used to evaluate the valves, as well as measure the blood flow and oxygen levels in different parts of your heart. For further information please visit HugeFiesta.[email or web address]. Please follow instruction sheet, as given.  Your physician recommends that you return for lab work in: [place] 3-7 DAYS PRIOR TO [facility] CATH         Signed, Curtis Klein, MD  04/27/2015 10:24 PM    [name] [place], [place], [place]  [address] Phone: [phone]; Fax: [phone]

## 2015-04-27 ENCOUNTER — Telehealth: Payer: Self-pay | Admitting: *Deleted

## 2015-04-27 ENCOUNTER — Ambulatory Visit
Admission: RE | Admit: 2015-04-27 | Discharge: 2015-04-27 | Disposition: A | Discharge status: Home / Self Care | Payer: Medicare Other | Source: Ambulatory Visit | Attending: Cardiovascular Disease | Admitting: Cardiovascular Disease

## 2015-04-27 DIAGNOSIS — I739 Peripheral vascular disease, unspecified: Secondary | ICD-10-CM | POA: Insufficient documentation

## 2015-04-27 DIAGNOSIS — I350 Nonrheumatic aortic (valve) stenosis: Secondary | ICD-10-CM

## 2015-04-27 DIAGNOSIS — I714 Abdominal aortic aneurysm, without rupture, unspecified: Secondary | ICD-10-CM | POA: Insufficient documentation

## 2015-04-27 LAB — COMPREHENSIVE METABOLIC PANEL
ALK PHOS: 57 U/L (ref 40–115)
ALT: 21 U/L (ref 9–46)
AST: 26 U/L (ref 10–35)
Albumin: 3.9 g/dL (ref 3.6–5.1)
BILIRUBIN TOTAL: 0.5 mg/dL (ref 0.2–1.2)
BUN: 16 mg/dL (ref 7–25)
CO2: 26 mmol/L (ref 20–31)
CREATININE: 1.44 mg/dL — AB (ref 0.70–1.18)
Calcium: 9.5 mg/dL (ref 8.6–10.3)
Chloride: 104 mmol/L (ref 98–110)
GLUCOSE: 77 mg/dL (ref 65–99)
Potassium: 4.3 mmol/L (ref 3.5–5.3)
SODIUM: 140 mmol/L (ref 135–146)
TOTAL PROTEIN: 7.1 g/dL (ref 6.1–8.1)

## 2015-04-27 LAB — APTT: aPTT: 37 seconds (ref 24–37)

## 2015-04-27 LAB — PROTIME-INR
INR: 1.05 (ref <1.50)
PROTHROMBIN TIME: 13.8 s (ref 11.6–15.2)

## 2015-04-27 NOTE — Telephone Encounter (Signed)
Order placed for PA and later CXR pre-procedure.

## 2015-04-30 LAB — LIPID PANEL
Cholesterol: 129 mg/dL (ref 125–200)
HDL: 39 mg/dL — AB (ref >=40)
LDL CALC: 49 mg/dL (ref <130)
TRIGLYCERIDES: 207 mg/dL — AB (ref <150)
Total CHOL/HDL Ratio: 3.3 Ratio (ref <=5.0)
VLDL: 41 mg/dL — AB (ref <30)

## 2015-05-04 ENCOUNTER — Ambulatory Visit (HOSPITAL_COMMUNITY)
Admission: RE | Admit: 2015-05-04 | Discharge: 2015-05-04 | Disposition: A | Discharge status: Home / Self Care | Payer: Medicare Other | Source: Ambulatory Visit | Attending: Cardiovascular Disease | Admitting: Cardiovascular Disease

## 2015-05-04 ENCOUNTER — Encounter (HOSPITAL_COMMUNITY): Payer: Self-pay | Admitting: Cardiovascular Disease

## 2015-05-04 ENCOUNTER — Encounter (HOSPITAL_COMMUNITY)
Admission: RE | Disposition: A | Discharge status: Home / Self Care | Payer: Self-pay | Source: Ambulatory Visit | Attending: Cardiovascular Disease

## 2015-05-04 DIAGNOSIS — I2582 Chronic total occlusion of coronary artery: Secondary | ICD-10-CM | POA: Insufficient documentation

## 2015-05-04 DIAGNOSIS — E785 Hyperlipidemia, unspecified: Secondary | ICD-10-CM | POA: Diagnosis not present

## 2015-05-04 DIAGNOSIS — I350 Nonrheumatic aortic (valve) stenosis: Secondary | ICD-10-CM | POA: Diagnosis present

## 2015-05-04 DIAGNOSIS — I714 Abdominal aortic aneurysm, without rupture: Secondary | ICD-10-CM | POA: Diagnosis not present

## 2015-05-04 DIAGNOSIS — Z950 Presence of cardiac pacemaker: Secondary | ICD-10-CM | POA: Insufficient documentation

## 2015-05-04 DIAGNOSIS — I352 Nonrheumatic aortic (valve) stenosis with insufficiency: Secondary | ICD-10-CM | POA: Insufficient documentation

## 2015-05-04 DIAGNOSIS — K219 Gastro-esophageal reflux disease without esophagitis: Secondary | ICD-10-CM | POA: Insufficient documentation

## 2015-05-04 DIAGNOSIS — I119 Hypertensive heart disease without heart failure: Secondary | ICD-10-CM | POA: Diagnosis not present

## 2015-05-04 DIAGNOSIS — I2510 Atherosclerotic heart disease of native coronary artery without angina pectoris: Secondary | ICD-10-CM | POA: Diagnosis not present

## 2015-05-04 DIAGNOSIS — G4733 Obstructive sleep apnea (adult) (pediatric): Secondary | ICD-10-CM | POA: Diagnosis not present

## 2015-05-04 DIAGNOSIS — I495 Sick sinus syndrome: Secondary | ICD-10-CM | POA: Diagnosis not present

## 2015-05-04 DIAGNOSIS — Z87891 Personal history of nicotine dependence: Secondary | ICD-10-CM | POA: Diagnosis not present

## 2015-05-04 DIAGNOSIS — Z7982 Long term (current) use of aspirin: Secondary | ICD-10-CM | POA: Insufficient documentation

## 2015-05-04 DIAGNOSIS — I739 Peripheral vascular disease, unspecified: Secondary | ICD-10-CM | POA: Diagnosis not present

## 2015-05-04 DIAGNOSIS — J449 Chronic obstructive pulmonary disease, unspecified: Secondary | ICD-10-CM | POA: Insufficient documentation

## 2015-05-04 DIAGNOSIS — I480 Paroxysmal atrial fibrillation: Secondary | ICD-10-CM | POA: Insufficient documentation

## 2015-05-04 DIAGNOSIS — Z8546 Personal history of malignant neoplasm of prostate: Secondary | ICD-10-CM | POA: Insufficient documentation

## 2015-05-04 DIAGNOSIS — I252 Old myocardial infarction: Secondary | ICD-10-CM | POA: Diagnosis not present

## 2015-05-04 DIAGNOSIS — I25810 Atherosclerosis of coronary artery bypass graft(s) without angina pectoris: Secondary | ICD-10-CM | POA: Insufficient documentation

## 2015-05-04 DIAGNOSIS — I2584 Coronary atherosclerosis due to calcified coronary lesion: Secondary | ICD-10-CM | POA: Diagnosis not present

## 2015-05-04 HISTORY — PX: CARDIAC CATHETERIZATION: SHX172

## 2015-05-04 LAB — POCT I-STAT 3, VENOUS BLOOD GAS (G3P V)
Acid-base deficit: 1 mmol/L (ref 0.0–2.0)
Bicarbonate: 25.5 mEq/L — ABNORMAL HIGH (ref 20.0–24.0)
O2 Saturation: 69 %
PCO2 VEN: 47.6 mmHg (ref 45.0–50.0)
PH VEN: 7.336 — AB (ref 7.250–7.300)
PO2 VEN: 39 mmHg (ref 30.0–45.0)
TCO2: 27 mmol/L (ref 0–100)

## 2015-05-04 LAB — POCT I-STAT 3, ART BLOOD GAS (G3+)
BICARBONATE: 25.8 meq/L — AB (ref 20.0–24.0)
O2 Saturation: 98 %
PH ART: 7.38 (ref 7.350–7.450)
TCO2: 27 mmol/L (ref 0–100)
pCO2 arterial: 43.6 mmHg (ref 35.0–45.0)
pO2, Arterial: 112 mmHg — ABNORMAL HIGH (ref 80.0–100.0)

## 2015-05-04 SURGERY — RIGHT/LEFT HEART CATH AND CORONARY/GRAFT ANGIOGRAPHY
Anesthesia: LOCAL

## 2015-05-04 MED ORDER — SODIUM CHLORIDE 0.9 % IV SOLN: INTRAVENOUS | Status: DC

## 2015-05-04 MED ORDER — SODIUM CHLORIDE 0.9% FLUSH: 3.0000 mL | INTRAVENOUS | Status: DC | PRN

## 2015-05-04 MED ORDER — MIDAZOLAM HCL 2 MG/2ML IJ SOLN
INTRAMUSCULAR | Status: AC
  Filled 2015-05-04: qty 2

## 2015-05-04 MED ORDER — FENTANYL CITRATE (PF) 100 MCG/2ML IJ SOLN
INTRAMUSCULAR | Status: AC
  Filled 2015-05-04: qty 2

## 2015-05-04 MED ORDER — LIDOCAINE HCL (PF) 1 % IJ SOLN
INTRAMUSCULAR | Status: DC | PRN
  Administered 2015-05-04: 15 mL

## 2015-05-04 MED ORDER — LIDOCAINE HCL (PF) 1 % IJ SOLN
INTRAMUSCULAR | Status: AC
  Filled 2015-05-04: qty 30

## 2015-05-04 MED ORDER — IOHEXOL 350 MG/ML SOLN
INTRAVENOUS | Status: DC | PRN
  Administered 2015-05-04: 55 mL via INTRAVENOUS

## 2015-05-04 MED ORDER — SODIUM CHLORIDE 0.9% FLUSH: 3.0000 mL | Freq: Two times a day (BID) | INTRAVENOUS | Status: DC

## 2015-05-04 MED ORDER — FENTANYL CITRATE (PF) 100 MCG/2ML IJ SOLN
INTRAMUSCULAR | Status: DC | PRN
  Administered 2015-05-04: 25 ug via INTRAVENOUS

## 2015-05-04 MED ORDER — HEPARIN (PORCINE) IN NACL 2-0.9 UNIT/ML-% IJ SOLN
INTRAMUSCULAR | Status: AC
  Filled 2015-05-04: qty 1500

## 2015-05-04 MED ORDER — HEPARIN (PORCINE) IN NACL 2-0.9 UNIT/ML-% IJ SOLN
INTRAMUSCULAR | Status: DC | PRN
  Administered 2015-05-04: 1500 mL

## 2015-05-04 MED ORDER — SODIUM CHLORIDE 0.9 % IV SOLN: 250.0000 mL | INTRAVENOUS | Status: DC | PRN

## 2015-05-04 MED ORDER — MIDAZOLAM HCL 2 MG/2ML IJ SOLN
INTRAMUSCULAR | Status: DC | PRN
  Administered 2015-05-04: 1 mg via INTRAVENOUS

## 2015-05-04 MED ORDER — SODIUM CHLORIDE 0.9 % WEIGHT BASED INFUSION: 1.0000 mL/kg/h | INTRAVENOUS | Status: DC

## 2015-05-04 MED ORDER — ASPIRIN 81 MG PO CHEW: 81.0000 mg | CHEWABLE_TABLET | ORAL | Status: DC

## 2015-05-04 MED ORDER — SODIUM CHLORIDE 0.9 % WEIGHT BASED INFUSION
3.0000 mL/kg/h | INTRAVENOUS | Status: AC
  Administered 2015-05-04: 3 mL/kg/h via INTRAVENOUS

## 2015-05-04 SURGICAL SUPPLY — 11 items
CATH INFINITI 5FR JL4 (CATHETERS) ×1 IMPLANT
CATH INFINITI JR4 5F (CATHETERS) ×2 IMPLANT
CATH SWAN GANZ 7F STRAIGHT (CATHETERS) ×1 IMPLANT
KIT HEART LEFT (KITS) ×1 IMPLANT
PACK CARDIAC CATHETERIZATION (CUSTOM PROCEDURE TRAY) ×1 IMPLANT
SHEATH PINNACLE 5F 10CM (SHEATH) ×1 IMPLANT
SHEATH PINNACLE 7F 10CM (SHEATH) ×1 IMPLANT
TRANSDUCER W/STOPCOCK (MISCELLANEOUS) ×1 IMPLANT
WIRE EMERALD 3MM-J .035X150CM (WIRE) ×1 IMPLANT
WIRE EMERALD ST .035X150CM (WIRE) ×1 IMPLANT
WIRE HI TORQ VERSACORE-J 145CM (WIRE) ×1 IMPLANT

## 2015-05-04 NOTE — Discharge Instructions (Signed)

## 2015-05-04 NOTE — Progress Notes (Signed)
Site area: rt groin Site Prior to Removal:  Level 0 Pressure Applied For: 20 minutesManual:   yes Patient Status During Pull:  stable Post Pull Site:  Level 0 Post Pull Instructions Given:  yes Post Pull Pulses Present:  yes Dressing Applied:  tegaderm Bedrest begins @ 1335 Comments:   

## 2015-05-04 NOTE — H&P (View-Only) (Signed)
Patient ID: [name], male   DOB: 1934-09-22, 81 y.o.   MRN: [id number]    Cardiology Office Note    Date:  04/27/2015   ID:  [name], DOB 17-Mar-1935, MRN [id number]  PCP:  Curtis Downing, MD  Cardiologist:   Curtis Klein, MD   Chief Complaint  Patient presents with  . New Evaluation    referred by Dr. Caryl Sims; discuss ECHO/aortic stenosis    History of Present Illness:  [name] is a 81 y.o. male with a long-standing history of cardiac problems. Recently he has developed worsening exertional dyspnea as well as a variety of vague, atypical and varying types of chest discomfort. He has been seeing Dr. Caryl Sims for his problems with sinus bradycardia and pacemaker management, following Dr. Lowella Fairy retirement.  He describes NYHA functional class II-III exertional dyspnea. His chest pain occurs at rest, varies in quality and location, may last for all day or longer. Denies syncope but has had occasional orthostatic dizziness. He is an active and socially engaged gentleman.  His follow-up echocardiogram performed January 9 shows worsening aortic stenosis with a mean gradient that is now 40 mmHg, peak gradient of 72 mmHg and mild to moderate aortic insufficiency. The calculated aortic valve area was 0.7 cm, but this may be an underestimation. Left ventricular systolic function remains normal and he did not have pulmonary hypertension. The left atrium is mildly dilated  [name] presented with an acute myocardial infarction in 1994 and eventually underwent 5 vessel bypass in 1997. Repeat angiography in 2003 showed interval occlusion of the saphenous vein graft to the right coronary artery but the other bypasses were widely patent (LIMA-LAD, SVG diagonal, sequential SVG to OM1-distal circumflex). He had a normal nuclear perfusion study in 2014.   He has a dual-chamber St. Jude Accent RF permanent pacemaker implanted for symptomatic sinus bradycardia, last checked on March 14, 2015 showing normal device function with estimated 8-10 years of generator longevity, 20% right atrial pacing and less than 1% right ventricular pacing. Underlying rhythm was mild sinus bradycardia at 54 bpm. Current leads were implanted in 1999, the current generator was a change out in 2011.  He has a small fusiform infrarenal abdominal aortic aneurysm with a maximum diameter of 3.6 cm stable by most recent duplex ultrasound November 2016. Has a history of right external iliac stenosis, but his most recent ankle-brachial indices were essentially normal.  He has treated hypertension and obstructive sleep apnea on CPAP. He reports good compliance with the device, but does complain about a facial rash she gets from using the mask.  Past Medical History  Diagnosis Date  . CAD (coronary artery disease)     a. s/p MI/PTCA - Cx 1994. b. s/p CABG x5 in 1997. c. Abnl nuc 2003- occ SVG-RCA, patent seq SVG-OM1-dCxOM, patentl LIMA-LAD, patent, SVG-diag. d. Normal nuc 09/2012.  [name] PAF (paroxysmal atrial fibrillation) ([place])     a. Remote per records.   . Syncope 11/28/97  . AAA (abdominal aortic aneurysm) ([place])     a. Last duplex - 3.8cm 01/2013 - due 01/2014.  . OSA on CPAP   . H/O prostate cancer     a. s/p radical retropubic prostatectomy with bilateral pelvic lymph node dissection  . Aortic stenosis, mild     Last echo 05/04/12 +LVH  . Back pain     hx epidural injections  . Acne rosacea   . H/O: GI bleed     mild, neg. colonoscopy  . Hyperlipidemia   .  Thrombocytopenia (HCC)     chronic, mild  . GERD (gastroesophageal reflux disease)   . COPD (chronic obstructive pulmonary disease) ([place])   . HTN (hypertension)   . Baker's cyst     Lt.  [name] PVD (peripheral vascular disease) ([place])     a. Rt ext iliac stenosis, last PV cath 2003, moderate stenosis last Lower Ext Dopplers 12/16/11 - Rt. ABI 0.96  Lt ABI 1.0.  . Pacemaker -[place] with change out 2006  . Sinus node dysfunction  ([employer]) 01/20/2013  . Hypertensive heart disease   . Sick sinus syndrome ([place])     a. placed 1999, gen change 2011 - St. Jude.  . Valvular heart disease     a. Echo 04/2012: mild-mod AS, mild AI, mild MR.  . Carotid artery disease ([place])     a. Duplex 01/2013: mildly abnormal. Mild plaque without significant diameter reduction. Repeat recommended 11/15.  [name] HOH (hard of hearing)   . Dizziness and giddiness 08/04/2013    Past Surgical History  Procedure Laterality Date  . Coronary artery bypass graft  1997    LIMA-LAD; VG-diag; seq VG-1st OM & distal LCX; VG-RCA  . Pacemaker insertion  11/28/97    pacesetter--ERI 2011  . Pacemaker generator change  08/15/2009    St. Jude accent  . Suprapubic prostatectomy    . Cardiac catheterization  2003    VG to RCA occluded, other grafts patent  . Coronary angioplasty  03/29/92    PTCA to LCX  . Cataracts    . Cataract extraction Bilateral     Outpatient Prescriptions Prior to Visit  Medication Sig Dispense Refill  . aspirin EC 81 MG tablet Take 81 mg by mouth every evening.    . metoprolol tartrate (LOPRESSOR) 25 MG tablet Take 0.5 tablets (12.5 mg total) by mouth 2 (two) times daily.    . nitroGLYCERIN (NITROSTAT) 0.4 MG SL tablet Place 1 tablet (0.4 mg total) under the tongue as needed. 25 tablet 3  . Omega-3 Fatty Acids (FISH OIL) 1200 MG CAPS Take 1,200 mg by mouth every evening.    . ranitidine (ZANTAC) 150 MG tablet Take 150 mg by mouth daily as needed for heartburn.    . simvastatin (ZOCOR) 80 MG tablet TAKE ONE TABLET BY MOUTH AT BEDTIME 90 tablet 3  . triamterene-hydrochlorothiazide (DYAZIDE) 37.5-25 MG per capsule Take 1 capsule by mouth daily - Monday through Friday 90 capsule 3  . vitamin C (ASCORBIC ACID) 500 MG tablet Take 500 mg by mouth every evening.     No facility-administered medications prior to visit.     Allergies:   Review of patient's allergies indicates no known allergies.   Social History   Social History  . Marital  Status: Widowed    Spouse Name: N/A  . Number of Children: 5  . Years of Education: 7th   Occupational History  . Retired    Social History Main Topics  . Smoking status: Former Smoker -- 2.00 packs/day for 48 years    Types: Cigarettes    Quit date: 03/17/1993  . Smokeless tobacco: Never Used  . Alcohol Use: Yes     Comment: occ  . Drug Use: No  . Sexual Activity: Not Asked   Other Topics Concern  . None   Social History Narrative     Family History:  [name] family history includes Cancer in his brother; Colon cancer in his father; Heart Problems in his  brother and sister.   ROS:   Please see the history of present illness.    ROS All other systems reviewed and are negative.   PHYSICAL EXAM:   VS:  BP 160/72 mmHg  Pulse 70  Ht 5\' 6"  (1.676 m)  Wt 91.218 kg (201 lb 1.6 oz)  BMI 32.47 kg/m2   GEN: Well nourished, well developed, in no acute distress HEENT: normal Neck: no JVD, carotid bruits, or masses. Carotid pulses are not clearly delayed Cardiac: RRR; mid peaking 3/6 systolic murmur heard in the aortic focus and the right lower sternal border, no diastolic murmurs, rubs or gallops,no edema  Respiratory:  clear to auscultation bilaterally, normal work of breathing GI: soft, nontender, nondistended, + BS MS: no deformity or atrophy Skin: warm and dry, no rash Neuro:  Alert and Oriented x 3, Strength and sensation are intact Psych: euthymic mood, full affect  Wt Readings from Last 3 Encounters:  04/26/15 91.218 kg (201 lb 1.6 oz)  03/14/15 90.901 kg (200 lb 6.4 oz)  02/16/14 87.998 kg (194 lb)      Studies/Labs Reviewed:   EKG:  EKG is not ordered today.  The ekg ordered 03/14/2015 demonstrates sinus bradycardia and left ventricular hypertrophy with typical repolarization abnormalities  Recent Labs: 04/26/2015: ALT 21; BUN 16; Creat 1.44*; Hemoglobin 13.4; Platelets 135*; Potassium 4.3; Sodium 140   Lipid Panel    Component Value Date/Time   CHOL  175 01/20/2013 1030   TRIG 167.0* 01/20/2013 1030   HDL 50.10 01/20/2013 1030   CHOLHDL 3 01/20/2013 1030   VLDL 33.4 01/20/2013 1030   LDLCALC 92 01/20/2013 1030    Additional studies/ records that were reviewed today include:  Last cath report, notes from Dr. Caryl Sims    ASSESSMENT:    1. Moderate to severe aortic stenosis   2. Coronary artery disease involving native coronary artery of native heart without angina pectoris   3. Sinus node dysfunction (HCC)   4. Pacemaker -[place]   5. HTN  6. Hyperlipidemia   7. PAD/AAA   PLAN:  In order of problems listed above:  1. AS: I think he is describing exertional dyspnea that could well be secondary to the aortic valve stenosis. The mean gradient is definitely substantially higher than it was in the past. His chest discomfort is highly atypical and not exertional. Even if his current symptoms are not directly attributable to aortic stenosis, his peak aortic valve velocity is well over 4 m/second, likely predicting the need for intervention in the next 12 months. His valve is heavily calcified. He is probably not an optimal candidate for redo sternotomy and aortic valve replacement. In fact, he states that he would never go through a procedure like that again since it was so painful and difficult. Discussed the option for TAVR, described the procedure in some detail, pointed out the fact that the workup is lengthy and that the presence of his peripheral arterial problems will have a significant impact on our options. I think the first step in his workup will be right and left heart catheterization and coronary/bypass angiography to reassess the status of his 79 year old grafts and obtain another measurement of his aortic valve stenosis. Will ask Dr. Gerrit Halls to perform this procedure. This procedure has been fully reviewed with the patient and informed consent has been obtained. 2. CAD s/p CABG: I don't think his chest pain is angina. He is on  statin, ASA and beta blocker. 3. Mild sinus bradycardia, he  is not pacemaker dependent 4. Normally functioning dual-chamber permanent pacemaker, somewhat lessens the concern for AV block with TAVR 5. HTN: EP is high today but he reports this is atypical. When he saw Dr. Caryl Sims in December his blood pressure was 128/78 6. HLP: Need to get an updated lipid profile, not checked since 2014 7. PAD/AAA: The documented right external iliac artery stenosis and small fusiform infrarenal abdominal aortic aneurysm need to be reevaluated before TAVR, but are likely not prohibitive problems.    Medication Adjustments/Labs and Tests Ordered: Current medicines are reviewed at length with the patient today.  Concerns regarding medicines are outlined above.  Medication changes, Labs and Tests ordered today are listed in the Patient Instructions below. Patient Instructions  Your physician has requested that you have a RIGHT AND LEFT HEART cardiac catheterization BY DR. Burt Knack. Cardiac catheterization is used to diagnose and/or treat various heart conditions. Doctors may recommend this procedure for a number of different reasons. The most common reason is to evaluate chest pain. Chest pain can be a symptom of coronary artery disease (CAD), and cardiac catheterization can show whether plaque is narrowing or blocking your heart's arteries. This procedure is also used to evaluate the valves, as well as measure the blood flow and oxygen levels in different parts of your heart. For further information please visit [email or web address]. Please follow instruction sheet, as given.  Your physician recommends that you return for lab work in: [place] 3-7 DAYS PRIOR TO [place] CATH         Signed, Curtis Klein, MD  04/27/2015 10:24 PM    [facility] [place], [place], [place]  [address] Phone: [phone]; Fax: [phone]

## 2015-05-04 NOTE — Interval H&P Note (Signed)
History and Physical Interval Note:  05/04/2015 12:15 PM  [name]  has presented today for surgery, with the diagnosis of aortic stenosis  The various methods of treatment have been discussed with the patient and family. After consideration of risks, benefits and other options for treatment, the patient has consented to  Procedure(s): Right/Left Heart Cath and Coronary/Graft Angiography (N/A) as a surgical intervention .  [name] history has been reviewed, patient examined, no change in status, stable for surgery.  I have reviewed the patient's chart and labs.  Questions were answered to the patient's satisfaction.     Sherren Mocha

## 2015-05-07 ENCOUNTER — Other Ambulatory Visit: Payer: Self-pay | Admitting: *Deleted

## 2015-05-07 DIAGNOSIS — I350 Nonrheumatic aortic (valve) stenosis: Secondary | ICD-10-CM

## 2015-05-07 DIAGNOSIS — N289 Disorder of kidney and ureter, unspecified: Secondary | ICD-10-CM

## 2015-05-10 ENCOUNTER — Telehealth: Payer: Self-pay | Admitting: Cardiovascular Disease

## 2015-05-10 NOTE — Telephone Encounter (Signed)
ERron

## 2015-05-11 ENCOUNTER — Ambulatory Visit (HOSPITAL_COMMUNITY)
Admission: RE | Admit: 2015-05-11 | Discharge: 2015-05-11 | Disposition: A | Discharge status: Home / Self Care | Payer: Medicare Other | Source: Ambulatory Visit | Attending: Cardiovascular Disease | Admitting: Cardiovascular Disease

## 2015-05-11 ENCOUNTER — Encounter (HOSPITAL_COMMUNITY): Payer: Self-pay

## 2015-05-11 DIAGNOSIS — I700 Atherosclerosis of aorta: Secondary | ICD-10-CM | POA: Insufficient documentation

## 2015-05-11 DIAGNOSIS — J438 Other emphysema: Secondary | ICD-10-CM | POA: Insufficient documentation

## 2015-05-11 DIAGNOSIS — R911 Solitary pulmonary nodule: Secondary | ICD-10-CM | POA: Insufficient documentation

## 2015-05-11 DIAGNOSIS — I358 Other nonrheumatic aortic valve disorders: Secondary | ICD-10-CM | POA: Diagnosis not present

## 2015-05-11 DIAGNOSIS — I350 Nonrheumatic aortic (valve) stenosis: Secondary | ICD-10-CM | POA: Insufficient documentation

## 2015-05-11 MED ORDER — IOHEXOL 350 MG/ML SOLN
80.0000 mL | Freq: Once | INTRAVENOUS | Status: AC | PRN
  Administered 2015-05-11: 80 mL via INTRAVENOUS

## 2015-05-16 ENCOUNTER — Other Ambulatory Visit: Payer: Self-pay | Admitting: *Deleted

## 2015-05-16 ENCOUNTER — Encounter: Payer: Self-pay | Admitting: Physical Therapy

## 2015-05-16 ENCOUNTER — Ambulatory Visit: Payer: Medicare Other | Attending: Cardiovascular Disease | Admitting: Physical Therapy

## 2015-05-16 DIAGNOSIS — R262 Difficulty in walking, not elsewhere classified: Secondary | ICD-10-CM | POA: Diagnosis present

## 2015-05-16 DIAGNOSIS — R293 Abnormal posture: Secondary | ICD-10-CM | POA: Diagnosis present

## 2015-05-16 DIAGNOSIS — I350 Nonrheumatic aortic (valve) stenosis: Secondary | ICD-10-CM | POA: Insufficient documentation

## 2015-05-16 DIAGNOSIS — N289 Disorder of kidney and ureter, unspecified: Secondary | ICD-10-CM

## 2015-05-16 LAB — COMPLETE METABOLIC PANEL WITH GFR
ALT: 20 U/L (ref 9–46)
AST: 27 U/L (ref 10–35)
Albumin: 4.1 g/dL (ref 3.6–5.1)
Alkaline Phosphatase: 65 U/L (ref 40–115)
BUN: 16 mg/dL (ref 7–25)
CHLORIDE: 104 mmol/L (ref 98–110)
CO2: 26 mmol/L (ref 20–31)
CREATININE: 1.03 mg/dL (ref 0.70–1.18)
Calcium: 8.9 mg/dL (ref 8.6–10.3)
GFR, EST AFRICAN AMERICAN: 79 mL/min (ref >=60)
GFR, EST NON AFRICAN AMERICAN: 69 mL/min (ref >=60)
Glucose, Bld: 83 mg/dL (ref 65–99)
Potassium: 4.2 mmol/L (ref 3.5–5.3)
Sodium: 138 mmol/L (ref 135–146)
Total Bilirubin: 0.8 mg/dL (ref 0.2–1.2)
Total Protein: 7.4 g/dL (ref 6.1–8.1)

## 2015-05-16 NOTE — Therapy (Signed)
[place], [place], [address] Phone: [phone]   Fax:  [phone]  Physical Therapy Evaluation  Patient Details  Name: [name] MRN: [id number] Date of Birth: 1934/07/28 Referring Provider: Dr. Sherren Mocha  Encounter Date: 05/16/2015      PT End of Session - 05/16/15 1230    Visit Number 1   PT Start Time [id number]   PT Stop Time 1227   PT Time Calculation (min) 38 min      Past Medical History  Diagnosis Date  . CAD (coronary artery disease)     a. s/p MI/PTCA - Cx 1994. b. s/p CABG x5 in 1997. c. Abnl nuc 2003- occ SVG-RCA, patent seq SVG-OM1-dCxOM, patentl LIMA-LAD, patent, SVG-diag. d. Normal nuc 09/2012.  [name] PAF (paroxysmal atrial fibrillation) ([place])     a. Remote per records.   . Syncope 11/28/97  . AAA (abdominal aortic aneurysm) ([place])     a. Last duplex - 3.8cm 01/2013 - due 01/2014.  . OSA on CPAP   . H/O prostate cancer     a. s/p radical retropubic prostatectomy with bilateral pelvic lymph node dissection  . Aortic stenosis, mild     Last echo 05/04/12 +LVH  . Back pain     hx epidural injections  . Acne rosacea   . H/O: GI bleed     mild, neg. colonoscopy  . Hyperlipidemia   . Thrombocytopenia (HCC)     chronic, mild  . GERD (gastroesophageal reflux disease)   . COPD (chronic obstructive pulmonary disease) ([place])   . HTN (hypertension)   . Baker's cyst     Lt.  [name] PVD (peripheral vascular disease) ([place])     a. Rt ext iliac stenosis, last PV cath 2003, moderate stenosis last Lower Ext Dopplers 12/16/11 - Rt. ABI 0.96  Lt ABI 1.0.  . Pacemaker -[place] with change out 2006  . Sinus node dysfunction ([place]) 01/20/2013  . Hypertensive heart disease   . Sick sinus syndrome ([place])     a. placed 1999, gen change 2011 - St. Jude.  . Valvular heart disease     a. Echo 04/2012: mild-mod AS, mild AI, mild MR.  . Carotid artery disease ([place])     a. Duplex 01/2013: mildly  abnormal. Mild plaque without significant diameter reduction. Repeat recommended 11/15.  [name] HOH (hard of hearing)   . Dizziness and giddiness 08/04/2013  . Cancer [facility])     Past Surgical History  Procedure Laterality Date  . Coronary artery bypass graft  1997    LIMA-LAD; VG-diag; seq VG-1st OM & distal LCX; VG-RCA  . Pacemaker insertion  11/28/97    pacesetter--ERI 2011  . Pacemaker generator change  08/15/2009    St. Jude accent  . Suprapubic prostatectomy    . Cardiac catheterization  2003    VG to RCA occluded, other grafts patent  . Coronary angioplasty  03/29/92    PTCA to LCX  . Cataracts    . Cataract extraction Bilateral   . Cardiac catheterization N/A 05/04/2015    Procedure: Right/Left Heart Cath and Coronary/Graft Angiography;  Surgeon: Sherren Mocha, MD;  Location: [facility];  Service: Cardiovascular;  Laterality: N/A;    There were no vitals filed for this visit.  Visit Diagnosis:  Difficulty walking - Plan: PT plan of care cert/re-cert  Abnormal posture - Plan: PT plan of care cert/re-cert  Severe aortic stenosis - Plan:  PT plan of care cert/re-cert      Subjective Assessment - 05/16/15 1153    Subjective chest tightness, SOB with walking over the last year, remained steady   Pertinent History hx prostate cancer, GERD, COPD, hyperlipidemia, pacemaker, PVD, CAD   Patient Stated Goals walk without SOB   Currently in Pain? No/denies            [facility] PT Assessment - 05/16/15 0001    Assessment   Medical Diagnosis severe aortic stenosis   Referring Provider Dr. Sherren Mocha   Onset Date/Surgical Date 03/14/15   Precautions   Precautions None   Restrictions   Weight Bearing Restrictions No   Balance Screen   Has the patient fallen in the past 6 months No   Has the patient had a decrease in activity level because of a fear of falling?  No   Is the patient reluctant to leave their home because of a fear of falling?  No   Home Environment    Living Environment Private residence   [place] to enter   Entrance Stairs-Number of Steps 3   Entrance Stairs-Rails Right;Left;Can reach both   [place] level   Prior Function   Level of Independence Independent   Posture/Postural Control   Posture/Postural Control Postural limitations   Postural Limitations Forward head;Rounded Shoulders   ROM / Strength   AROM / PROM / Strength AROM;Strength   AROM   Overall AROM Comments Grossly WNL   Strength   Overall Strength Comments Grossly 5/5   Strength Assessment Site Hand   Right/Left hand Right;Left   Right Hand Grip (lbs) 66  R hand dominant   Left Hand Grip (lbs) 75   Ambulation/Gait   Gait Comments Pt ambulates independently without any significant deviations or imbalance.           [facility] Pre-Surgical Assessment - 05/16/15 0001    5 Meter Walk Test- trial 1 4.7 sec   5 Meter Walk Test- trial 2 5 sec.    5 Meter Walk Test- trial 3 4.7 sec.  </= 6 sec WNL   5 meter walk test average 4.8 sec   Timed Up & Go Test trial  14 sec.   Comments >/= 12 seconds indicates increased fall risk   4 Stage Balance Test tolerated for:  3 sec.   4 Stage Balance Test Position 4   comment not indicative of high fall risk   Sit To Stand Test- trial 1 13.9 sec.   Comment </= 12.6 WNL   ADL/IADL Independent with: Bathing;Dressing;Meal prep;Finances   ADL/IADL Needs Assistance with: [place] work   ADL/IADL Fraility Index Vulnerable   6 Minute Walk- Baseline yes   BP (mmHg) 140/68 mmHg   HR (bpm) 61   02 Sat (%RA) 95 %   Modified Borg Scale for Dyspnea 0- Nothing at all   Perceived Rate of Exertion (Borg) 6-   6 Minute Walk Post Test yes   BP (mmHg) 174/72 mmHg   HR (bpm) 79   02 Sat (%RA) 93 %   Modified Borg Scale for Dyspnea 3- Moderate shortness of breath or breathing difficulty   Perceived Rate of Exertion (Borg) 13- Somewhat hard   Aerobic Endurance Distance Walked 1260   Endurance  additional comments Pt did not require any rst breaks but did exhibit increasing exertional SOB throughout 6 minute walk test. BP went up significantly. BP recovered to resting measure after 3-4  minutes.                                     Plan - 05-23-15 1230    Clinical Impression Statement Pt is a 81 yo male presenting to PT eval prior to possible TAVR surgery due to severe aortic stenosis. Pt reports he has been symptomatic approximately one year and is now requiring more assistance with yardwork and difficulty walking longer distances. Pt presents with good strength/ROM, good balance and walking speed, and mildly limited aerobic endurance in 6 minute walk test. Pt able to ambulate 1260' however incresing SOB evident and pt demonstrated a significant rise in systolic BP. Pt recovered within 3-4 minutes.    PT Frequency One time visit   Consulted and Agree with Plan of Care Patient          G-Codes - 05-23-2015 1234    Functional Assessment Tool Used 6 minute walk test 1260'   Functional Limitation Mobility: Walking and moving around   Mobility: Walking and Moving Around Current Status [phone]) At least 1 percent but less than 20 percent impaired, limited or restricted   Mobility: Walking and Moving Around Goal Status [phone]) At least 1 percent but less than 20 percent impaired, limited or restricted   Mobility: Walking and Moving Around Discharge Status [phone]) At least 1 percent but less than 20 percent impaired, limited or restricted       Problem List Patient Active Problem List   Diagnosis Date Noted  . AAA (abdominal aortic aneurysm) ([place]) 04/27/2015  . PAD (peripheral artery disease) ([place]) 04/27/2015  . Dizziness and giddiness 08/04/2013  . Dizziness 07/19/2013  . Sinus node dysfunction ([place]) 01/20/2013  . Hypertensive cardiovascular disease 01/20/2013  . PAF (paroxysmal atrial fibrillation) ([place])   . CAD (coronary artery disease) - status post  CABG in 1997; known occluded SVG-RCA; negative Myoview July 2014   . Pacemaker -[employer]   . Moderate to severe aortic stenosis 05/07/2012    Class: Diagnosis of  . Syncope 11/28/1997    NICOLETTA,DANA, PT 05-23-2015, 12:35 PM  [facility] [address] [place], [place], [address] Phone: [phone]   Fax:  [phone]  Name: [name] MRN: [id number] Date of Birth: December 28, 1934

## 2015-05-18 ENCOUNTER — Ambulatory Visit (HOSPITAL_COMMUNITY): Payer: Medicare Other

## 2015-05-18 ENCOUNTER — Encounter (HOSPITAL_COMMUNITY): Payer: Medicare Other

## 2015-05-22 ENCOUNTER — Ambulatory Visit (HOSPITAL_COMMUNITY): Admission: RE | Admit: 2015-05-22 | Payer: Medicare Other | Source: Ambulatory Visit

## 2015-05-22 ENCOUNTER — Ambulatory Visit (HOSPITAL_COMMUNITY)
Admission: RE | Admit: 2015-05-22 | Discharge: 2015-05-22 | Disposition: A | Discharge status: Home / Self Care | Payer: Medicare Other | Source: Ambulatory Visit | Attending: Cardiovascular Disease | Admitting: Cardiovascular Disease

## 2015-05-22 DIAGNOSIS — I350 Nonrheumatic aortic (valve) stenosis: Secondary | ICD-10-CM

## 2015-05-22 DIAGNOSIS — N289 Disorder of kidney and ureter, unspecified: Secondary | ICD-10-CM | POA: Diagnosis not present

## 2015-05-22 DIAGNOSIS — I700 Atherosclerosis of aorta: Secondary | ICD-10-CM | POA: Insufficient documentation

## 2015-05-22 DIAGNOSIS — I2510 Atherosclerotic heart disease of native coronary artery without angina pectoris: Secondary | ICD-10-CM | POA: Diagnosis not present

## 2015-05-22 DIAGNOSIS — I714 Abdominal aortic aneurysm, without rupture: Secondary | ICD-10-CM | POA: Diagnosis not present

## 2015-05-22 DIAGNOSIS — Z951 Presence of aortocoronary bypass graft: Secondary | ICD-10-CM | POA: Insufficient documentation

## 2015-05-22 DIAGNOSIS — Z01818 Encounter for other preprocedural examination: Secondary | ICD-10-CM | POA: Diagnosis not present

## 2015-05-22 DIAGNOSIS — J439 Emphysema, unspecified: Secondary | ICD-10-CM | POA: Diagnosis not present

## 2015-05-22 LAB — PULMONARY FUNCTION TEST
DL/VA % pred: 72 %
DL/VA: 3.14 ml/min/mmHg/L
DLCO unc % pred: 57 %
DLCO unc: 15.6 ml/min/mmHg
FEF 25-75 Post: 0.67 L/sec
FEF 25-75 Pre: 0.62 L/sec
FEF2575-%CHANGE-POST: 7 %
FEF2575-%PRED-POST: 40 %
FEF2575-%Pred-Pre: 37 %
FEV1-%CHANGE-POST: 0 %
FEV1-%PRED-PRE: 64 %
FEV1-%Pred-Post: 63 %
FEV1-POST: 1.54 L
FEV1-Pre: 1.55 L
FEV1FVC-%CHANGE-POST: -2 %
FEV1FVC-%Pred-Pre: 75 %
FEV6-%Change-Post: 1 %
FEV6-%PRED-POST: 84 %
FEV6-%Pred-Pre: 83 %
FEV6-PRE: 2.65 L
FEV6-Post: 2.68 L
FEV6FVC-%Change-Post: 0 %
FEV6FVC-%PRED-PRE: 99 %
FEV6FVC-%Pred-Post: 99 %
FVC-%CHANGE-POST: 1 %
FVC-%PRED-POST: 84 %
FVC-%Pred-Pre: 83 %
FVC-POST: 2.89 L
FVC-Pre: 2.85 L
POST FEV1/FVC RATIO: 53 %
PRE FEV6/FVC RATIO: 93 %
Post FEV6/FVC ratio: 93 %
Pre FEV1/FVC ratio: 54 %
RV % PRED: 434 %
RV: 10.51 L
TLC % pred: 212 %
TLC: 13.28 L

## 2015-05-22 MED ORDER — IOHEXOL 300 MG/ML  SOLN
100.0000 mL | Freq: Once | INTRAMUSCULAR | Status: AC | PRN
  Administered 2015-05-22: 100 mL via INTRAVENOUS

## 2015-05-22 MED ORDER — ALBUTEROL SULFATE (2.5 MG/3ML) 0.083% IN NEBU
2.5000 mg | INHALATION_SOLUTION | Freq: Once | RESPIRATORY_TRACT | Status: AC
  Administered 2015-05-22: 2.5 mg via RESPIRATORY_TRACT

## 2015-05-23 ENCOUNTER — Other Ambulatory Visit: Payer: Self-pay | Admitting: *Deleted

## 2015-05-23 ENCOUNTER — Institutional Professional Consult (permissible substitution) (INDEPENDENT_AMBULATORY_CARE_PROVIDER_SITE_OTHER): Payer: Medicare Other | Admitting: Surgery

## 2015-05-23 ENCOUNTER — Encounter: Payer: Self-pay | Admitting: Surgery

## 2015-05-23 VITALS — BP 136/86 | HR 74 | Resp 20 | Ht 66.0 in | Wt 195.0 lb

## 2015-05-23 DIAGNOSIS — I350 Nonrheumatic aortic (valve) stenosis: Secondary | ICD-10-CM | POA: Diagnosis not present

## 2015-05-25 ENCOUNTER — Encounter: Payer: Self-pay | Admitting: Surgery

## 2015-05-25 NOTE — Progress Notes (Signed)
Patient ID: [name], male   DOB: 12-08-1934, 81 y.o.   MRN: [id number]   [facility] CONSULTATION REPORT  Referring Provider is Sherren Mocha, MD PCP is [name], MD  Chief Complaint  Patient presents with  . Aortic Stenosis    Surgical eval for possible TAVR procdure, discuss all CT's. Cardiac Cath 05/04/15    HPI:  The patient is a 81 year old gentleman with a history of coronary artery disease s/p CABG x 5 by me in 1997, PAF, sinus bradycardia s/p PPM followed by Dr. [name], PVD, COPD and OSA, as well as aortic stenosis. For the past couple years he has noticed some exertional dyspnea, fatigue and chest discomfort. His symptoms have been progressing but he says he still feels well. His most recent echo in January showed an increase in the mean gradient to 40 mm Hg with a peak of 72 mm Hg and mild to moderate AI. The AVA was 0.7 cm2. LVEF was 60-65%. He was evaluated by Dr. Burt Knack and had a cath on 05/04/2015 showing severe 3 vessel CAD with a calcified RCA with mild diffuse nonobstructive disease. The vein graft to the PDA is chronically occluded. The other bypasses are patent. The mean AV gradient is 28 mm Hg with an AVA of 1.08 cm2. Gated cardiac CT showed a trileaflet AV with anatomy suitable for a 29 mm Sapient 3 valve. Abdominal and pelvic CT showed small caliber external iliac arteries that did not appear to be adequate for transfemoral access.  Past Medical History  Diagnosis Date  . CAD (coronary artery disease)     a. s/p MI/PTCA - Cx 1994. b. s/p CABG x5 in 1997. c. Abnl nuc 2003- occ SVG-RCA, patent seq SVG-OM1-dCxOM, patentl LIMA-LAD, patent, SVG-diag. d. Normal nuc 09/2012.  [name] PAF (paroxysmal atrial fibrillation) ([place])     a. Remote per records.   . Syncope 11/28/97  . AAA (abdominal aortic aneurysm) ([place])     a. Last duplex - 3.8cm 01/2013 - due 01/2014.  . OSA on CPAP   . H/O  prostate cancer     a. s/p radical retropubic prostatectomy with bilateral pelvic lymph node dissection  . Aortic stenosis, mild     Last echo 05/04/12 +LVH  . Back pain     hx epidural injections  . Acne rosacea   . H/O: GI bleed     mild, neg. colonoscopy  . Hyperlipidemia   . Thrombocytopenia (HCC)     chronic, mild  . GERD (gastroesophageal reflux disease)   . COPD (chronic obstructive pulmonary disease) ([place])   . HTN (hypertension)   . Baker's cyst     Lt.  [name] PVD (peripheral vascular disease) ([place])     a. Rt ext iliac stenosis, last PV cath 2003, moderate stenosis last Lower Ext Dopplers 12/16/11 - Rt. ABI 0.96  Lt ABI 1.0.  . Pacemaker -[place] with change out 2006  . Sinus node dysfunction ([place]) 01/20/2013  . Hypertensive heart disease   . Sick sinus syndrome ([place])     a. placed 1999, gen change 2011 - St. Jude.  . Valvular heart disease     a. Echo 04/2012: mild-mod AS, mild AI, mild MR.  . Carotid artery disease ([place])     a. Duplex 01/2013: mildly abnormal. Mild plaque without significant diameter reduction. Repeat recommended 11/15.  [name] HOH (hard of hearing)   .  Dizziness and giddiness 08/04/2013  . Cancer [facility])     Past Surgical History  Procedure Laterality Date  . Coronary artery bypass graft  1997    LIMA-LAD; VG-diag; seq VG-1st OM & distal LCX; VG-RCA  . Pacemaker insertion  11/28/97    pacesetter--ERI 2011  . Pacemaker generator change  08/15/2009    St. Jude accent  . Suprapubic prostatectomy    . Cardiac catheterization  2003    VG to RCA occluded, other grafts patent  . Coronary angioplasty  03/29/92    PTCA to LCX  . Cataracts    . Cataract extraction Bilateral   . Cardiac catheterization N/A 05/04/2015    Procedure: Right/Left Heart Cath and Coronary/Graft Angiography;  Surgeon: Sherren Mocha, MD;  Location: [facility];  Service: Cardiovascular;  Laterality: N/A;    Family History  Problem Relation Age of Onset  . CAD    .  Colon cancer Father   . Cancer Brother   . Heart Problems Brother   . Heart Problems Sister     Social History   Social History  . Marital Status: Widowed    Spouse Name: N/A  . Number of Children: 5  . Years of Education: 7th   Occupational History  . Retired    Social History Main Topics  . Smoking status: Former Smoker -- 2.00 packs/day for 48 years    Types: Cigarettes    Quit date: 03/17/1993  . Smokeless tobacco: Never Used  . Alcohol Use: Yes     Comment: occ  . Drug Use: No  . Sexual Activity: Not on file   Other Topics Concern  . Not on file   Social History Narrative    Current Outpatient Prescriptions  Medication Sig Dispense Refill  . aspirin EC 81 MG tablet Take 81 mg by mouth every evening.    . metoprolol tartrate (LOPRESSOR) 25 MG tablet Take 0.5 tablets (12.5 mg total) by mouth 2 (two) times daily.    . nitroGLYCERIN (NITROSTAT) 0.4 MG SL tablet Place 1 tablet (0.4 mg total) under the tongue as needed. 25 tablet 3  . Omega-3 Fatty Acids (FISH OIL) 1200 MG CAPS Take 1,200 mg by mouth every evening.    . ranitidine (ZANTAC) 150 MG tablet Take 150 mg by mouth daily as needed for heartburn.    . simvastatin (ZOCOR) 80 MG tablet TAKE ONE TABLET BY MOUTH AT BEDTIME 90 tablet 3  . triamterene-hydrochlorothiazide (DYAZIDE) 37.5-25 MG per capsule Take 1 capsule by mouth daily - Monday through Friday 90 capsule 3  . vitamin C (ASCORBIC ACID) 500 MG tablet Take 500 mg by mouth every evening.     No current facility-administered medications for this visit.    No Known Allergies    Review of Systems:   General:  normal appetite, decreased energy, no weight gain, no weight loss, no fever  Cardiac:  occasional chest pain with exertion, no chest pain at rest, mild SOB with moderate exertion, no resting SOB, no PND, no orthopnea, no palpitations, Hx of  arrhythmia, Paroxysmal atrial fibrillation, no LE edema, no dizzy spells, no syncope  Respiratory:  has  shortness of breath, no home oxygen, no productive cough, no dry cough, no bronchitis, no wheezing, no hemoptysis, no asthma, no pain with inspiration or cough, has sleep apnea, wears CPAP at night  GI:   no difficulty swallowing, no reflux, no frequent heartburn, no hiatal hernia, no abdominal pain, no constipation, no diarrhea, no hematochezia,  no hematemesis, no melena  GU:   no dysuria,  no frequency, no urinary tract infection, no hematuria, no enlarged prostate, no kidney stones, no kidney disease  Vascular:  no pain suggestive of claudication, no pain in feet, no leg cramps, no varicose veins, no DVT, no non-healing foot ulcer  Neuro:   no stroke, no TIA's, no seizures, no headaches, no temporary blindness one eye,  no slurred speech, no peripheral neuropathy, no chronic pain, no instability of gait, no memory/cognitive dysfunction  Musculoskeletal: has arthritis, no joint swelling, no myalgias, no difficulty walking, no mobility   Skin:   no rash, no itching, no skin infections, no pressure sores or ulcerations  Psych:   no anxiety, no depression, no nervousness, no unusual recent stress  Eyes:   no blurry vision, no floaters, no recent vision changes, does not wear glasses or contacts  ENT:   no hearing loss, no loose or painful teeth, partial dentures, last saw dentist this year  Hematologic:  no easy bruising, no abnormal bleeding, no clotting disorder, no frequent epistaxis  Endocrine:  no diabetes, does not check CBG's at home           Physical Exam:   BP 136/86 mmHg  Pulse 74  Resp 20  Ht 5\' 6"  (1.676 m)  Wt 195 lb (88.451 kg)  BMI 31.49 kg/m2  SpO2 96%  General:  Elderly but well-appearing  HEENT:  Unremarkable , NCAT, PERLA, EOMI, oropharynx clear  Neck:   no JVD, no bruits, no adenopathy or thyromegaly  Chest:   clear to auscultation, symmetrical breath sounds, no wheezes, no rhonchi   CV:   RRR, grade III/VI crescendo/decrescendo murmur heard best at RSB,  no  diastolic murmur  Abdomen:  soft, non-tender, no masses or organomegaly  Extremities:  warm, well-perfused, pulses diminished in feet, no LE edema, right leg SV harvest incisions.  Rectal/GU  Deferred  Neuro:   Grossly non-focal and symmetrical throughout  Skin:   Clean and dry, no rashes, no breakdown   Diagnostic Tests:   [facility]  [address], [place] [address]  [phone]  ------------------------------------------------------------------- Transthoracic Echocardiography  Patient: Ezekiah, Frydenlund MR #: [id number] Study Date: 03/26/2015 Gender: M Age: 54 Height: 170.2 cm Weight: 90.7 kg BSA: 2.1 m^2 Pt. Status: Room:  SONOGRAPHER [place], Will Dwyane Luo, Mickle Mallory ATTENDING Ena Dawley, M.D. PERFORMING [facility], Outpatient  cc:  ------------------------------------------------------------------- LV EF: 60% - 65%  ------------------------------------------------------------------- Indications: (I35.0).  ------------------------------------------------------------------- History: PMH: Acquired from the patient and from the patient&'s chart. Atrial fibrillation. Coronary artery disease. Aortic stenosis.  ------------------------------------------------------------------- Study Conclusions  - Left ventricle: The cavity size was mildly dilated. There was  mild concentric hypertrophy. Systolic function was normal. The  estimated ejection fraction was in the range of 60% to 65%. Wall  motion was normal; there were no regional wall motion  abnormalities. Doppler parameters are consistent with abnormal  left ventricular relaxation (grade 1 diastolic dysfunction).  Doppler parameters are consistent with elevated ventricular  end-diastolic filling pressure. - Aortic  valve: Trileaflet; severely thickened, severely calcified  leaflets. Valve mobility was restricted. There was moderate  stenosis. There was mild to moderate regurgitation. Peak gradient  (S): 72 mm Hg. Mean gradient (S): 40 mm Hg. - Aortic root: The aortic root was normal in size. - Mitral valve: Calcified annulus. - Left atrium: The atrium was mildly dilated. - Right ventricle: Systolic function was normal. - Tricuspid valve: There was trivial  regurgitation. - Pulmonary arteries: The main pulmonary artery was normal-sized.  Systolic pressure was within the normal range. - Inferior vena cava: The vessel was normal in size.  Impressions:  - When compared to the prior study from 07/12/2013 aortic stenosis  is now severe.  ------------------------------------------------------------------- Labs, prior tests, procedures, and surgery: Permanent pacemaker system implantation.  Coronary artery bypass grafting. Transthoracic echocardiography. M-mode, complete 2D, spectral Doppler, and color Doppler. Birthdate: Patient birthdate: [birth year]. Age: Patient is 81 yr old. Sex: Gender: male. BMI: 31.3 kg/m^2. Blood pressure: 128/78 Patient status: Outpatient. Study date: Study date: 03/26/2015. Study time: 01:31 PM. Location: [facility]  -------------------------------------------------------------------  ------------------------------------------------------------------- Left ventricle: The cavity size was mildly dilated. There was mild concentric hypertrophy. Systolic function was normal. The estimated ejection fraction was in the range of 60% to 65%. Wall motion was normal; there were no regional wall motion abnormalities. Doppler parameters are consistent with abnormal left ventricular relaxation (grade 1 diastolic dysfunction). Doppler parameters are consistent with elevated ventricular end-diastolic filling  pressure.  ------------------------------------------------------------------- Aortic valve: Trileaflet; severely thickened, severely calcified leaflets. Valve mobility was restricted. Doppler: There was moderate stenosis. There was mild to moderate regurgitation. VTI ratio of LVOT to aortic valve: 0.21. Valve area (VTI): 0.71 cm^2. Indexed valve area (VTI): 0.34 cm^2/m^2. Mean velocity ratio of LVOT to aortic valve: 0.21. Valve area (Vmean): 0.72 cm^2. Indexed valve area (Vmean): 0.34 cm^2/m^2. Peak gradient (S): 72 mm Hg. Mean gradient (S): 40 mm Hg.  ------------------------------------------------------------------- Aorta: Aortic root: The aortic root was normal in size.  ------------------------------------------------------------------- Mitral valve: Calcified annulus. Mobility was not restricted. Doppler: Transvalvular velocity was within the normal range. There was no evidence for stenosis. There was no regurgitation. Peak gradient (D): 3 mm Hg.  ------------------------------------------------------------------- Left atrium: The atrium was mildly dilated.  ------------------------------------------------------------------- Right ventricle: The cavity size was normal. Wall thickness was normal. Pacer wire or catheter noted in right ventricle. Systolic function was normal.  ------------------------------------------------------------------- Pulmonic valve: Structurally normal valve. Cusp separation was normal. Doppler: Transvalvular velocity was within the normal range. There was no evidence for stenosis. There was no regurgitation.  ------------------------------------------------------------------- Tricuspid valve: Structurally normal valve. Doppler: Transvalvular velocity was within the normal range. There was trivial regurgitation.  ------------------------------------------------------------------- Pulmonary artery: The  main pulmonary artery was normal-sized. Systolic pressure was within the normal range.  ------------------------------------------------------------------- Right atrium: The atrium was normal in size. Pacer wire or catheter noted in right atrium.  ------------------------------------------------------------------- Pericardium: There was no pericardial effusion.  ------------------------------------------------------------------- Systemic veins: Inferior vena cava: The vessel was normal in size.  ------------------------------------------------------------------- Measurements  Left ventricle Value Reference LV ID, ED, PLAX chordal (H) 56.8 mm 43 - 52 LV ID, ES, PLAX chordal (H) 38.4 mm 23 - 38 LV fx shortening, PLAX chordal 32 % >=29 LV PW thickness, ED 11.6 mm --------- IVS/LV PW ratio, ED 1.02 <=1.3 Stroke volume, 2D 73 ml --------- Stroke volume/bsa, 2D 35 ml/m^2 --------- LV e&', lateral 7.51 cm/s --------- LV E/e&', lateral 11.5 --------- LV e&', medial 6.14 cm/s --------- LV E/e&', medial 14.07 --------- LV e&', average 6.83 cm/s --------- LV E/e&', average 12.66 ---------  Ventricular septum Value Reference IVS thickness, ED 11.8 mm ---------  LVOT Value Reference LVOT ID, S 21 mm --------- LVOT area 3.46 cm^2 --------- LVOT  ID 21 mm --------- LVOT peak velocity, S 94.7 cm/s --------- LVOT mean velocity, S 59.2 cm/s --------- LVOT VTI, S 21.2 cm --------- Stroke volume (SV), LVOT DP 73.4 ml --------- Stroke index (SV/bsa), LVOT DP 35  ml/m^2 ---------  Aortic valve Value Reference Aortic valve mean velocity, S 286 cm/s --------- Aortic valve VTI, S 103 cm --------- Aortic mean gradient, S 38 mm Hg --------- VTI ratio, LVOT/AV 0.21 --------- Aortic valve area, VTI 0.71 cm^2 --------- Aortic valve area/bsa, VTI 0.34 cm^2/m^2 --------- Velocity ratio, mean, LVOT/AV 0.21 --------- Aortic valve area, mean velocity 0.72 cm^2 --------- Aortic valve area/bsa, mean 0.34 cm^2/m^2 --------- velocity Aortic regurg pressure half-time 479 ms ---------  Aorta Value Reference Aortic root ID, ED 32 mm --------- Ascending aorta ID, A-P, S 33 mm ---------  Left atrium Value Reference LA ID, A-P, ES 43 mm --------- LA ID/bsa, A-P 2.05 cm/m^2 <=2.2 LA volume, S 72 ml --------- LA volume/bsa, S 34.3 ml/m^2 --------- LA volume, ES, 1-p A4C 60 ml --------- LA volume/bsa, ES, 1-p A4C 28.6 ml/m^2 --------- LA volume, ES, 1-p A2C 86 ml --------- LA  volume/bsa, ES, 1-p A2C 41 ml/m^2 ---------  Mitral valve Value Reference Mitral E-wave peak velocity 86.4 cm/s --------- Mitral A-wave peak velocity 108 cm/s --------- Mitral deceleration time (H) 289 ms 150 - 230 Mitral peak gradient, D 3 mm Hg --------- Mitral E/A ratio, peak 0.8 ---------  Pulmonary arteries Value Reference PA pressure, S, DP 27 mm Hg <=30  Tricuspid valve Value Reference Tricuspid regurg peak velocity 247 cm/s --------- Tricuspid peak RV-RA gradient 24 mm Hg ---------  Systemic veins Value Reference Estimated CVP 3 mm Hg ---------  Right ventricle Value Reference RV pressure, S, DP 27 mm Hg <=30 RV s&', lateral, S 8.68 cm/s ---------  Legend: (L) and (H) mark values outside specified reference range.  ------------------------------------------------------------------- Prepared and Electronically Authenticated by  Ena Dawley, M.D. 2017-01-09T18:11:59  Sherren Mocha, MD (Primary)      Procedures    Right/Left Heart Cath and Coronary/Graft Angiography    Conclusion     Ost LM lesion, 40% stenosed.  Ost Cx to Prox Cx lesion, 90% stenosed.  Ost LAD to Prox LAD lesion, 75% stenosed.  Ost Ramus lesion, 99% stenosed.  Mid LAD lesion, 50% stenosed.  Prox RCA lesion, 50% stenosed.  Dist RCA lesion, 25% stenosed.  SVG .  Widely patent SVG-diagonal  SVG was injected is normal in caliber.  Widely patent sequential SVG-Ramus and OM2  SVG was  injected .  Origin lesion, 100% stenosed.  1. Severe 3 vessel CAD with diffuse calcific LAD stenosis and severe proximal LCx stenosis 2. Calcified RCA with mild diffuse nonobstructive disease 3. S/P aortocoronary bypass with patent LIMA-LAD, SVG-diagonal, and sequential SVG-ramus and OM2. Chronic occlusion of the SVG-right PDA 4. Moderate-severe aortic stenosis with mean gradient 28 mmHg and calculated AVA 1.08 square cm  Continued evaluation for TAVR. Pt clearly has severe aortic stenosis by echo criteria and progressive clinical symptoms.     Indications    Severe aortic stenosis [I35.0 (ICD-10-CM)]    Technique and Indications    INDICATION: 81 yo male with prior CABG, now with NYHA 2 symptoms of progressive angina and dyspnea, found to have severe aortic stenosis by echo criteria  PROCEDURAL DETAILS: The right groin was prepped, draped, and anesthetized with 1% lidocaine. Using the modified Seldinger technique a 5 French sheath was placed in the right femoral artery and a 7 French sheath was placed in the right femoral vein. A Swan-Ganz catheter was used for the right heart catheterization. Standard protocol was followed for recording of right heart pressures and sampling of oxygen saturations. Fick cardiac output was calculated. Standard Judkins catheters were used for selective  coronary angiography and left ventriculography. There were no immediate procedural complications. The patient was transferred to the post catheterization recovery area for further monitoring.  During this procedure the patient is administered a total of Versed 1 mg and Fentanyl 25 mg to achieve and maintain moderate conscious sedation. [name] heart rate, blood pressure, and oxygen saturation are monitored continuously during the procedure. The period of conscious sedation is 21 minutes, of which I was present face-to-face 100% of this time. Estimated blood loss <50 mL. There were no immediate  complications during the procedure.    Coronary Findings    Dominance: Right   Left Main   . Ost LM lesion, 40% stenosed. Calcified. Mild stenosis in the mid-portion of the left main     Left Anterior Descending   . Ost LAD to Prox LAD lesion, 75% stenosed. Calcified.   . Mid LAD lesion, 50% stenosed. Calcified diffuse.     Ramus Intermedius  . Vessel is small.   Colon Flattery Ramus lesion, 99% stenosed. Calcified.     Left Circumflex   . Ost Cx to Prox Cx lesion, 90% stenosed. Calcified eccentric.     Right Coronary Artery   . Prox RCA lesion, 50% stenosed. Calcified.   . Dist RCA lesion, 25% stenosed. Calcified.     Graft Angiography    Free Graft to 1st Diag  SVG Widely patent SVG-diagonal     Sequential single graft Graft to Ramus, 2nd Mrg  SVG was injected is normal in caliber. Widely patent sequential SVG-Ramus and OM2     Free Graft to RPDA  SVG was injected .   [name] Origin lesion, 100% stenosed. Chronically occluded           Right Heart Pressures Hemodynamic findings consistent with aortic stenosis. LV EDP is normal.    Left Heart    Aortic Valve There is severe aortic valve stenosis. The aortic valve is calcified. Mean gradient 28 mmHg, peak-to-peak gradient 29 mmHg, AVA 1.08 square cm, AVA index 0.55    Coronary Diagrams    Diagnostic Diagram            Implants     No implant documentation for this case.    PACS Images    Show images for Cardiac catheterization     Link to Procedure Log    Procedure Log      Hemo Data       Most Recent Value   Fick Cardiac Output  5.32 L/min   Fick Cardiac Output Index  2.7 (L/min)/BSA   Aortic Mean Gradient  28 mmHg   Aortic Peak Gradient  29 mmHg   Aortic Valve Area  1.08   Aortic Value Area Index  0.55 cm2/BSA   RA A Wave  8 mmHg   RA V Wave  7 mmHg   RA Mean  6 mmHg   RV Systolic Pressure  31 mmHg   RV Diastolic Pressure  1 mmHg   RV EDP  8 mmHg   PA Systolic  Pressure  28 mmHg   PA Diastolic Pressure  8 mmHg   PA Mean  17 mmHg   PW A Wave  11 mmHg   PW V Wave  6 mmHg   PW Mean  6 mmHg   AO Systolic Pressure  123456 mmHg   AO Diastolic Pressure  68 mmHg   AO Mean  94 mmHg   LV Systolic Pressure  0000000 mmHg   LV Diastolic Pressure  2 mmHg   LV EDP  13 mmHg   Arterial Occlusion Pressure Extended Systolic Pressure  123456 mmHg   Arterial Occlusion Pressure Extended Diastolic Pressure  64 mmHg   Arterial Occlusion Pressure Extended Mean Pressure  94 mmHg   Left Ventricular Apex Extended Systolic Pressure  0000000 mmHg   Left Ventricular Apex Extended Diastolic Pressure  1 mmHg   Left Ventricular Apex Extended EDP Pressure  12 mmHg   QP/QS  1   TPVR Index  6.29 HRUI   TSVR Index  34.77 HRUI   PVR SVR Ratio  0.13   TPVR/TSVR Ratio  0.18   ADDENDUM REPORT: 05/11/2015 14:05 CLINICAL DATA: Aortic stenosis EXAM: Cardiac TAVR CT TECHNIQUE: The patient was scanned on a Philips 256 scanner. A 120 kV retrospective scan was triggered in the descending thoracic aorta at 111 HU's. Gantry rotation speed was 270 msecs and collimation was .9 mm. No beta blockade or nitro were given. The 3D data set was reconstructed in 5% intervals of the R-R cycle. Systolic and diastolic phases were analyzed on a dedicated work station using MPR, MIP and VRT modes. The patient received 80 cc of contrast. FINDINGS: Aortic Valve: Trileaflet and heavily calcified. The annulus is very elliptical Aorta: Mild calcification of the arch Sinotubular Junction: 30 mm Ascending Thoracic Aorta: 33.7 mm Aortic Arch: 25 mm Descending Thoracic Aorta: 29 mm Sinus of Valsalva Measurements: Non-coronary: 33 mm Right -coronary: 31 mm Left -coronary: 32 mm Coronary Artery Height above Annulus: Left Main: 12.8 mm Right Coronary: 20 mm Virtual Basal Annulus Measurements: Maximum/Minimum Diameter: 31 mm x 22 mm Perimeter: 88 mm Area: 566 mm2 Coronary  Arteries: Sufficient height above annulus for delivery Optimum Fluoroscopic Angle for Delivery: AP IMPRESSION: 1) Calcified trileaflet AV with annulus 566 mm2 suitable for a 29 mm Sapien 3 valve 2) Sufficient coronary height for delivery 3) Optimum angiographic angle for delivery is AP 4) RV enlargement with pacing wires 5) Mild calcification of the aortic arch Jenkins Rouge Electronically Signed  By: Jenkins Rouge M.D.  On: 05/11/2015 14:05     Study Result     EXAM: OVER-READ INTERPRETATION CT CHEST  The following report is an over-read performed by radiologist Dr. Laurence Ferrari [facility] on 05/11/2015. This over-read does not include interpretation of cardiac or coronary anatomy or pathology. The coronary CTA interpretation by the cardiologist is attached.  COMPARISON: 04/27/2015 ; 01/09/2012  FINDINGS: Mediastinum/Nodes: Aortic arch and branch vessel atherosclerotic calcification noted with prior CABG. No pathologic adenopathy  Lungs/Pleura: Mild paraseptal emphysema. Calcified granuloma medially in the right lower lobe, image 58 series 412. 3 mm noncalcified nodule in the lingula, image 49 series 412. Linear subsegmental atelectasis or scarring in the lower lingula and in the left lower lobe along the hemidiaphragm. Mosaic attenuation at the lung bases probably from air trapping in the setting of bronchiolitis given the underlying airway thickening.  Upper abdomen: Hepatic morphology suspicious for early cirrhosis.  Musculoskeletal: Thoracic spondylosis.  IMPRESSION: 1. 3 mm noncalcified lingular nodule. I do not see that the vertical level of this nodule was included on prior abdomen CTs. If the patient is at high risk for bronchogenic carcinoma, follow-up chest CT at 1 year is recommended. If the patient is at low risk, no follow-up is needed. This recommendation follows the consensus statement: Guidelines for Management of  Small Pulmonary Nodules Detected on CT Scans: A Statement from the [place] as published in Radiology 2005; 237:395-400. 2. Additional extracardiac findings include aortic  arch and branch vessel atherosclerosis ; paraseptal emphysema; airway thickening suggesting bronchitis; mosaic attenuation in the lung bases favoring air trapping from bronchiolitis; and hepatic morphology suspicious for early cirrhosis.  Electronically Signed: By: Van Clines M.D. On: 05/11/2015 12:25    CLINICAL DATA: 81 year old male with history of severe aortic stenosis. Preprocedural study prior to potential transcatheter aortic valve replacement (TAVR).  EXAM: CT ANGIOGRAPHY CHEST, ABDOMEN AND PELVIS  TECHNIQUE: Multidetector CT imaging through the chest, abdomen and pelvis was performed using the standard protocol during bolus administration of intravenous contrast. Multiplanar reconstructed images and MIPs were obtained and reviewed to evaluate the vascular anatomy.  CONTRAST: 183mL OMNIPAQUE IOHEXOL 300 MG/ML SOLN  COMPARISON: Cardiac CT 05/11/2015. CT the abdomen pelvis 01/09/2012.  FINDINGS: CTA CHEST FINDINGS  Mediastinum/Lymph Nodes: Heart size is borderline enlarged. There is no significant pericardial fluid, thickening or pericardial calcification. There is atherosclerosis of the thoracic aorta, the great vessels of the mediastinum and the coronary arteries, including calcified atherosclerotic plaque in the left main, left anterior descending, left circumflex and right coronary arteries. Status post median sternotomy for CABG, including LIMA to the LAD. Severe thickening and calcification of the aortic valve. No pathologically enlarged mediastinal or hilar lymph nodes. Esophagus is unremarkable in appearance. No axillary lymphadenopathy. Right-sided pacemaker device in position with lead tips terminating in the right atrial appendage and right ventricular  apex.  Lungs/Pleura: No acute consolidative airspace disease. No pleural effusions. No suspicious appearing pulmonary nodules or masses. Mild diffuse bronchial thickening. Mild paraseptal emphysema noted predominantly in the lung apices.  Musculoskeletal/Soft Tissues: Median sternotomy wires. There are no aggressive appearing lytic or blastic lesions noted in the visualized portions of the skeleton.  CTA ABDOMEN AND PELVIS FINDINGS  Hepatobiliary: The liver has a slightly shrunken appearance and nodular contour, suggestive of cirrhosis. No discrete cystic or solid hepatic lesions are identified. No intra or extrahepatic biliary ductal dilatation. Gallbladder is normal in appearance.  Pancreas: The pancreatic mass. No pancreatic ductal dilatation. No pancreatic or peripancreatic fluid or inflammatory changes.  Spleen: Unremarkable.  Adrenals/Urinary Tract: Calcifications in the left renal hila are favored to be vascular, although the presence of nonobstructive calculi is not excluded. Sub cm low-attenuation lesion in the interpolar region of the left kidney is too small to definitively characterize, but is statistically likely a small cyst. Right kidney and bilateral adrenal glands are normal in appearance. No hydroureteronephrosis or perinephric stranding to indicate urinary tract obstruction at this time. The appearance of the urinary bladder is normal.  Stomach/Bowel: The appearance of the stomach is normal. No pathologic dilatation of small bowel or colon.  Vascular/Lymphatic: Extensive atherosclerosis throughout the abdominal and pelvic vasculature, including fusiform aneurysmal dilatation of the infrarenal abdominal aorta which measures up to 4.1 x 3.8 cm, shortly above the bifurcation. There is a large burden of atheromatous plaque and/or mural thrombus within the aneurysmally dilated segment of the infrarenal abdominal aorta. 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 Vascular findings and measurements pertinent to potential TAVR procedure, as detailed below. No lymphadenopathy noted in the abdomen or pelvis.  Reproductive: Prostate gland is either diminutive or surgically absent. Seminal vesicles are unremarkable in appearance.  Other: Trace volume of ascites in the low anatomic pelvis. No larger volume of ascites. No pneumoperitoneum.  Musculoskeletal: There are no aggressive appearing lytic or blastic lesions noted in the visualized portions of the skeleton.  VASCULAR MEASUREMENTS PERTINENT TO TAVR:  AORTA:  Minimal  Aortic Diameter - 19 x 17 mm  Severity of Aortic Calcification - moderate to severe  RIGHT PELVIS:  Right Common Iliac Artery -  Minimal Diameter - 7.9 x 8.3 mm  Tortuosity - mild  Calcification - moderate to severe  Right External Iliac Artery -  Minimal Diameter - 3.4 x 3.7 mm  Tortuosity - severe  Calcification - mild  Right Common Femoral Artery -  Minimal Diameter - 6.5 x 4.8 mm  Tortuosity - mild  Calcification - mild to moderate  LEFT PELVIS:  Left Common Iliac Artery -  Minimal Diameter - 8.4 x 8.4 mm  Tortuosity - mild  Calcification - moderate-to-severe  Left External Iliac Artery -  Minimal Diameter - 5.7 x 5.0 mm  Tortuosity - severe  Calcification - mild  Left Common Femoral Artery -  Minimal Diameter - 7.6 x 4.9 mm  Tortuosity - mild  Calcification - moderate  Review of the MIP images confirms the above findings.  IMPRESSION: 1. Vascular findings and measurements pertinent to potential TAVR procedure, as detailed above. This patient does not appear to have suitable pelvic arterial access on either side (particularly on the right side) secondary to the small caliber of the vessels and extreme tortuosity of the external iliac segments. 2. Severe calcifications of the aortic  valve, compatible with the reported clinical history of severe aortic stenosis. 3. Mild diffuse bronchial wall thickening with mild paraseptal emphysema; imaging findings suggestive of underlying COPD. 4. Atherosclerosis, including left main and 3 vessel coronary artery disease. Status post median sternotomy for CABG, including LIMA to the LAD. In addition, there is aneurysmal dilatation of the infrarenal abdominal aorta which measures up to 4.1 x 3.8 cm, and contains a large burden of atheromatous plaque and/or mural thrombus within the aneurysmal segment. 5. Morphologic changes in the liver compatible with underlying cirrhosis. 6. Additional incidental findings, as above.   Electronically Signed  By: Vinnie Langton M.D.  On: 05/22/2015 15:02   Impression:  He has stage D severe symptomatic aortic stenosis with exertional fatigue, shortness of breath and chest pain. He is a little vague with his description of his symptoms but I think he probably has NYHA class 2 symptoms. His chest pain sounds non-exertional and intermittent and I don't think it is ischemic. I have personally reviewed his echo, cath and CTA studies. He clearly has progressive severe AS with a trileaflet, severely thickened and calcified valve with a mean gradient of 40 mm Hg. I think AVR is indicated for this active gentleman. I think TAVR is the best procedure for him at 81 years old with prior CABG with 4/5 patent grafts and no indication for redo CABG. His operative risk for open surgical AVR would be significant and higher than with TAVR. He has inadequate pelvic access for transfemoral access and will need either transapical access or possibly left axillary artery access. He does have significant calcified plaque at the takeoff of the left subclavian artery from the aorta but I think the lumen may be adequate.   The patient and his wife were counseled at length regarding treatment alternatives for management of  severe symptomatic aortic stenosis. Alternative approaches such as conventional aortic valve replacement, transcatheter aortic valve replacement, and palliative medical therapy were compared and contrasted at length. The risks associated with conventional surgical aortic valve replacement were been discussed in detail, as were expectations for post-operative convalescence. Long-term prognosis with medical therapy was discussed.   We discussed complications that might develop including  but not limited to risks of death, stroke, paravalvular leak, aortic dissection or other major vascular complications, aortic annulus rupture, device embolization, cardiac rupture or perforation, mitral regurgitation, acute myocardial infarction, arrhythmia, heart block or bradycardia requiring permanent pacemaker placement, congestive heart failure, respiratory failure, renal failure, pneumonia, infection, other late complications related to structural valve deterioration or migration, or other complications that might ultimately cause a temporary or permanent loss of functional independence or other long term morbidity. The patient provides full informed consent for the procedure as described and all questions were answered. He would like to proceed with TAVR.    Plan:  He will return to see Dr. Roxy Manns for a second surgical opinion and then we will schedule surgery . We will review his CTA studies and decide on the best alternative access for TAVR.    Gaye Pollack, MD 05/23/2015

## 2015-05-29 ENCOUNTER — Other Ambulatory Visit: Payer: Self-pay | Admitting: *Deleted

## 2015-05-29 DIAGNOSIS — I350 Nonrheumatic aortic (valve) stenosis: Secondary | ICD-10-CM

## 2015-06-05 ENCOUNTER — Encounter: Payer: Self-pay | Admitting: Thoracic Surgery (Cardiothoracic Vascular Surgery)

## 2015-06-05 ENCOUNTER — Institutional Professional Consult (permissible substitution) (INDEPENDENT_AMBULATORY_CARE_PROVIDER_SITE_OTHER): Payer: Medicare Other | Admitting: Thoracic Surgery (Cardiothoracic Vascular Surgery)

## 2015-06-05 VITALS — BP 130/79 | HR 65 | Resp 20 | Ht 66.0 in | Wt 195.0 lb

## 2015-06-05 DIAGNOSIS — I350 Nonrheumatic aortic (valve) stenosis: Secondary | ICD-10-CM | POA: Insufficient documentation

## 2015-06-05 NOTE — Progress Notes (Signed)
HEART AND [facility]GERY CONSULTATION REPORT  Referring Provider is Sherren Mocha, MD  Primary Cardiologist is Croitoru, Dani Gobble, MD PCP is [name], MD  Chief Complaint  Patient presents with  . Aortic Stenosis    2nd TAVR eval, surgery scheduled for 06/12/15    HPI:  Patient is a 81 year old male with history of aortic stenosis, coronary artery disease status post coronary artery bypass grafting in the remote past, paroxysmal atrial fibrillation with symptomatic right of cardia and syncope, status post permanent pacemaker placement, Hypertension, COPD, hyperlipidemia, prostate cancer, and obstructive sleep apnea on CPAP who has been referred for a second surgical opinion to discuss treatment options for management of severe symptomatic aortic stenosis.  [name] cardiac history dates back to 1994 when he originally presented with an acute myocardial infarction. In 1997 he underwent coronary artery bypass grafting 5 by Dr. Cyndia Bent for severe three-vessel coronary artery disease.  He developed pitches mitral fibrillation and sick sinus syndrome and underwent permanent pacemaker placement in 1999 with generator change for end-of-life in 2011. He was followed for many years by Dr. Rollene Fare and more recently has been followed by Dr. Sallyanne Kuster.  Follow-up echocardiograms demonstrated the presence of aortic stenosis. Over the past 2 years the patient has developed regressive symptoms of exertional shortness of breath. Follow-up echocardiogram performed 03/26/2015 referred revealed further progression of disease with Peak and mean transvalvular gradients estimated 72 and 40 mmHg respectively. Left ventricular systolic function remained preserved with ejection fraction estimated 60-65%. He was referred to Dr. Burt Knack and underwent diagnostic cardiac catheterization on failure or 17 2017. This revealed the presence of severe  three-vessel coronary artery disease with mild diffuse nonobstructive disease in the right coronary artery. Previous left internal mammary artery graft to the left anterior descending cornea artery remained patent, as was the vein graft to the left circumflex system. The vein graft to the posterior descending coronary artery was chronically occluded. Mean transvalvular gradient by catheterization was measured 28 mmHg corresponding to an aortic valve area calculated 1.08 cm. The patient was referred for surgical consultation and has been evaluated previously by Dr. Cyndia Bent. The patient is felt to be at least moderate to high-risk for conventional surgical aortic valve replacement with or without redo coronary artery bypass grafting. Transcatheter aortic valve replacement has discussed is a far less invasive and potentially less risky alternative, and the patient has been referred for a second surgical consultation.  The patient is a widower and lives alone locally in [place].  He has a son who lives in a house next door and a daughter who lives in [facility].  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 He has remained completely functionally independent. He admits that he is not terribly active physically, but he reports no significant limitations of the than exertional shortness of breath.  He states that over the past 2 years he asked has experienced progressive exertional shortness of breath. He states that with ordinary activities around the house he is fine, but with more strenuous exertion he gets short of breath and tired more easily than he has in the past. He denies any history of exertional chest pain or chest tightness. He denies any history of resting shortness of breath, PND, orthopnea or lower extremity edema.  His mobility is not limited. He uses CPAP for sleep at night. He does have occasional bright red blood in his stool, but he has been  seen by gastroenterologist  and underwent colonoscopy 2 years ago. The blood has been attributed to hemorrhoids.  Past Medical History  Diagnosis Date  . CAD (coronary artery disease)     a. s/p MI/PTCA - Cx 1994. b. s/p CABG x5 in 1997. c. Abnl nuc 2003- occ SVG-RCA, patent seq SVG-OM1-dCxOM, patentl LIMA-LAD, patent, SVG-diag. d. Normal nuc 09/2012.  [name] PAF (paroxysmal atrial fibrillation) ([place])     a. Remote per records.   . Syncope 11/28/97  . AAA (abdominal aortic aneurysm) ([place])     a. Last duplex - 3.8cm 01/2013 - due 01/2014.  . OSA on CPAP   . H/O prostate cancer     a. s/p radical retropubic prostatectomy with bilateral pelvic lymph node dissection  . Aortic stenosis, mild     Last echo 05/04/12 +LVH  . Back pain     hx epidural injections  . Acne rosacea   . H/O: GI bleed     mild, neg. colonoscopy  . Hyperlipidemia   . Thrombocytopenia (HCC)     chronic, mild  . GERD (gastroesophageal reflux disease)   . COPD (chronic obstructive pulmonary disease) ([place])   . HTN (hypertension)   . Baker's cyst     Lt.  [name] PVD (peripheral vascular disease) ([place])     a. Rt ext iliac stenosis, last PV cath 2003, moderate stenosis last Lower Ext Dopplers 12/16/11 - Rt. ABI 0.96  Lt ABI 1.0.  . Pacemaker -[place] with change out 2006  . Sinus node dysfunction ([place]) 01/20/2013  . Hypertensive heart disease   . Sick sinus syndrome ([place])     a. placed 1999, gen change 2011 - St. Jude.  . Valvular heart disease     a. Echo 04/2012: mild-mod AS, mild AI, mild MR.  . Carotid artery disease ([place])     a. Duplex 01/2013: mildly abnormal. Mild plaque without significant diameter reduction. Repeat recommended 11/15.  [name] HOH (hard of hearing)   . Dizziness and giddiness 08/04/2013  . Cancer ([place])   . Aortic stenosis 05/07/2012    Past Surgical History  Procedure Laterality Date  . Coronary artery bypass graft  1997    LIMA-LAD; VG-diag; seq VG-1st OM & distal LCX; VG-RCA  . Pacemaker insertion  11/28/97     pacesetter--ERI 2011  . Pacemaker generator change  08/15/2009    St. Jude accent  . Suprapubic prostatectomy    . Cardiac catheterization  2003    VG to RCA occluded, other grafts patent  . Coronary angioplasty  03/29/92    PTCA to LCX  . Cataracts    . Cataract extraction Bilateral   . Cardiac catheterization N/A 05/04/2015    Procedure: Right/Left Heart Cath and Coronary/Graft Angiography;  Surgeon: Sherren Mocha, MD;  Location: [facility];  Service: Cardiovascular;  Laterality: N/A;    Family History  Problem Relation Age of Onset  . CAD    . Colon cancer Father   . Cancer Brother   . Heart Problems Brother   . Heart Problems Sister     Social History   Social History  . Marital Status: Widowed    Spouse Name: N/A  . Number of Children: 5  . Years of Education: 7th   Occupational History  . Retired    Social History Main Topics  . Smoking status: Former Smoker -- 2.00 packs/day for 48 years    Types: Cigarettes    Quit  date: 03/17/1993  . Smokeless tobacco: Never Used  . Alcohol Use: Yes     Comment: occ  . Drug Use: No  . Sexual Activity: Not on file   Other Topics Concern  . Not on file   Social History Narrative    Current Outpatient Prescriptions  Medication Sig Dispense Refill  . aspirin EC 81 MG tablet Take 81 mg by mouth every evening.    . metoprolol tartrate (LOPRESSOR) 25 MG tablet Take 0.5 tablets (12.5 mg total) by mouth 2 (two) times daily.    . nitroGLYCERIN (NITROSTAT) 0.4 MG SL tablet Place 1 tablet (0.4 mg total) under the tongue as needed. 25 tablet 3  . Omega-3 Fatty Acids (FISH OIL) 1200 MG CAPS Take 1,200 mg by mouth every evening.    . ranitidine (ZANTAC) 150 MG tablet Take 150 mg by mouth daily as needed for heartburn.    . simvastatin (ZOCOR) 80 MG tablet TAKE ONE TABLET BY MOUTH AT BEDTIME 90 tablet 3  . triamterene-hydrochlorothiazide (DYAZIDE) 37.5-25 MG per capsule Take 1 capsule by mouth daily - Monday through Friday 90  capsule 3  . vitamin C (ASCORBIC ACID) 500 MG tablet Take 500 mg by mouth every evening.     No current facility-administered medications for this visit.    No Known Allergies    Review of Systems:   General:  normal appetite, decreased energy, no weight gain, no weight loss, no fever  Cardiac:  no chest pain with exertion, no chest pain at rest, + SOB with exertion, no resting SOB, no PND, no orthopnea, no palpitations, no arrhythmia, + atrial fibrillation, no LE edema, no dizzy spells, no syncope  Respiratory:  + exertional shortness of breath, no home oxygen, no productive cough, no dry cough, no bronchitis, no wheezing, no hemoptysis, no asthma, no pain with inspiration or cough, + sleep apnea, + CPAP at night  GI:   no difficulty swallowing, no reflux, no frequent heartburn, no hiatal hernia, no abdominal pain, no constipation, no diarrhea, + hematochezia, no hematemesis, no melena  GU:   no dysuria,  no frequency, no urinary tract infection, no hematuria, no enlarged prostate, no kidney stones, no kidney disease  Vascular:  no pain suggestive of claudication, no pain in feet, no leg cramps, no varicose veins, no DVT, no non-healing foot ulcer  Neuro:   no stroke, no TIA's, no seizures, no headaches, no temporary blindness one eye,  no slurred speech, no peripheral neuropathy, no chronic pain, no instability of gait, no memory/cognitive dysfunction  Musculoskeletal: + arthritis primarily in the shoulders, no joint swelling, no myalgias, no difficulty walking, normal mobility   Skin:   no rash, no itching, no skin infections, no pressure sores or ulcerations  Psych:   no anxiety, no depression, no nervousness, no unusual recent stress  Eyes:   no blurry vision, no floaters, no recent vision changes, + wears glasses or contacts  ENT:   + hearing loss, no loose or painful teeth, edentulous, + dentures but doesn't wear them  Hematologic:  no easy bruising, no abnormal bleeding, no clotting  disorder, no frequent epistaxis  Endocrine:  no diabetes, does not check CBG's at home           Physical Exam:   BP 130/79 mmHg  Pulse 65  Resp 20  Ht 5\' 6"  (1.676 m)  Wt 195 lb (88.451 kg)  BMI 31.49 kg/m2  SpO2 96%  General:  Mildly obese,  well-appearing  HEENT:  Unremarkable   Neck:   no JVD, no bruits, no adenopathy   Chest:   clear to auscultation, symmetrical breath sounds, no wheezes, no rhonchi   CV:   RRR, grade III/VI crescendo/decrescendo murmur heard best at RUSB,  no diastolic murmur  Abdomen:  soft, non-tender, no masses   Extremities:  warm, well-perfused, pulses palpable in groin, not at ankle, no LE edema  Rectal/GU  Deferred  Neuro:   Grossly non-focal and symmetrical throughout  Skin:   Clean and dry, no rashes, no breakdown   Diagnostic Tests:  Transthoracic Echocardiography  Patient: Erhardt, Mancil MR #: [id number] Study Date: 03/26/2015 Gender: M Age: 70 Height: 170.2 cm Weight: 90.7 kg BSA: 2.1 m^2 Pt. Status: Room:  SONOGRAPHER [place], Will Dwyane Luo, Mickle Mallory ATTENDING Ena Dawley, M.D. PERFORMING [facility], Outpatient  cc:  ------------------------------------------------------------------- LV EF: 60% - 65%  ------------------------------------------------------------------- Indications: (I35.0).  ------------------------------------------------------------------- History: PMH: Acquired from the patient and from the patient&'s chart. Atrial fibrillation. Coronary artery disease. Aortic stenosis.  ------------------------------------------------------------------- Study Conclusions  - Left ventricle: The cavity size was mildly dilated. There was  mild concentric hypertrophy. Systolic function was normal. The  estimated ejection fraction was in the range of 60% to 65%. Wall  motion was normal; there were no regional wall  motion  abnormalities. Doppler parameters are consistent with abnormal  left ventricular relaxation (grade 1 diastolic dysfunction).  Doppler parameters are consistent with elevated ventricular  end-diastolic filling pressure. - Aortic valve: Trileaflet; severely thickened, severely calcified  leaflets. Valve mobility was restricted. There was moderate  stenosis. There was mild to moderate regurgitation. Peak gradient  (S): 72 mm Hg. Mean gradient (S): 40 mm Hg. - Aortic root: The aortic root was normal in size. - Mitral valve: Calcified annulus. - Left atrium: The atrium was mildly dilated. - Right ventricle: Systolic function was normal. - Tricuspid valve: There was trivial regurgitation. - Pulmonary arteries: The main pulmonary artery was normal-sized.  Systolic pressure was within the normal range. - Inferior vena cava: The vessel was normal in size.  Impressions:  - When compared to the prior study from 07/12/2013 aortic stenosis  is now severe.  ------------------------------------------------------------------- Labs, prior tests, procedures, and surgery: Permanent pacemaker system implantation.  Coronary artery bypass grafting. Transthoracic echocardiography. M-mode, complete 2D, spectral Doppler, and color Doppler. Birthdate: Patient birthdate: [birth year]. Age: Patient is 81 yr old. Sex: Gender: male. BMI: 31.3 kg/m^2. Blood pressure: 128/78 Patient status: Outpatient. Study date: Study date: 03/26/2015. Study time: 01:31 PM. Location: [facility]  -------------------------------------------------------------------  ------------------------------------------------------------------- Left ventricle: The cavity size was mildly dilated. There was mild concentric hypertrophy. Systolic function was normal. The estimated ejection fraction was in the range of 60% to 65%. Wall motion was normal; there were no regional wall motion  abnormalities. Doppler parameters are consistent with abnormal left ventricular relaxation (grade 1 diastolic dysfunction). Doppler parameters are consistent with elevated ventricular end-diastolic filling pressure.  ------------------------------------------------------------------- Aortic valve: Trileaflet; severely thickened, severely calcified leaflets. Valve mobility was restricted. Doppler: There was moderate stenosis. There was mild to moderate regurgitation. VTI ratio of LVOT to aortic valve: 0.21. Valve area (VTI): 0.71 cm^2. Indexed valve area (VTI): 0.34 cm^2/m^2. Mean velocity ratio of LVOT to aortic valve: 0.21. Valve area (Vmean): 0.72 cm^2. Indexed valve area (Vmean): 0.34 cm^2/m^2. Peak gradient (S): 72 mm Hg. Mean gradient (S): 40 mm Hg.  ------------------------------------------------------------------- Aorta: Aortic root: The aortic root was normal in size.  ------------------------------------------------------------------- Mitral valve: Calcified annulus. Mobility was not  restricted. Doppler: Transvalvular velocity was within the normal range. There was no evidence for stenosis. There was no regurgitation. Peak gradient (D): 3 mm Hg.  ------------------------------------------------------------------- Left atrium: The atrium was mildly dilated.  ------------------------------------------------------------------- Right ventricle: The cavity size was normal. Wall thickness was normal. Pacer wire or catheter noted in right ventricle. Systolic function was normal.  ------------------------------------------------------------------- Pulmonic valve: Structurally normal valve. Cusp separation was normal. Doppler: Transvalvular velocity was within the normal range. There was no evidence for stenosis. There was no regurgitation.  ------------------------------------------------------------------- Tricuspid valve: Structurally  normal valve. Doppler: Transvalvular velocity was within the normal range. There was trivial regurgitation.  ------------------------------------------------------------------- Pulmonary artery: The main pulmonary artery was normal-sized. Systolic pressure was within the normal range.  ------------------------------------------------------------------- Right atrium: The atrium was normal in size. Pacer wire or catheter noted in right atrium.  ------------------------------------------------------------------- Pericardium: There was no pericardial effusion.  ------------------------------------------------------------------- Systemic veins: Inferior vena cava: The vessel was normal in size.  ------------------------------------------------------------------- Measurements  Left ventricle Value Reference LV ID, ED, PLAX chordal (H) 56.8 mm 43 - 52 LV ID, ES, PLAX chordal (H) 38.4 mm 23 - 38 LV fx shortening, PLAX chordal 32 % >=29 LV PW thickness, ED 11.6 mm --------- IVS/LV PW ratio, ED 1.02 <=1.3 Stroke volume, 2D 73 ml --------- Stroke volume/bsa, 2D 35 ml/m^2 --------- LV e&', lateral 7.51 cm/s --------- LV E/e&', lateral 11.5 --------- LV e&', medial 6.14 cm/s --------- LV E/e&', medial 14.07 --------- LV e&', average 6.83 cm/s --------- LV E/e&', average 12.66 ---------  Ventricular septum Value Reference IVS thickness, ED 11.8 mm  ---------  LVOT Value Reference LVOT ID, S 21 mm --------- LVOT area 3.46 cm^2 --------- LVOT ID 21 mm --------- LVOT peak velocity, S 94.7 cm/s --------- LVOT mean velocity, S 59.2 cm/s --------- LVOT VTI, S 21.2 cm --------- Stroke volume (SV), LVOT DP 73.4 ml --------- Stroke index (SV/bsa), LVOT DP 35 ml/m^2 ---------  Aortic valve Value Reference Aortic valve mean velocity, S 286 cm/s --------- Aortic valve VTI, S 103 cm --------- Aortic mean gradient, S 38 mm Hg --------- VTI ratio, LVOT/AV 0.21 --------- Aortic valve area, VTI 0.71 cm^2 --------- Aortic valve area/bsa, VTI 0.34 cm^2/m^2 --------- Velocity ratio, mean, LVOT/AV 0.21 --------- Aortic valve area, mean velocity 0.72 cm^2 --------- Aortic valve area/bsa, mean 0.34 cm^2/m^2 --------- velocity Aortic regurg pressure half-time 479 ms ---------  Aorta Value Reference Aortic root ID, ED 32 mm --------- Ascending aorta ID, A-P, S 33 mm ---------  Left atrium Value Reference LA ID, A-P, ES 43 mm --------- LA ID/bsa, A-P 2.05 cm/m^2 <=2.2 LA volume, S 72 ml --------- LA volume/bsa, S 34.3  ml/m^2 --------- LA volume, ES, 1-p A4C 60 ml --------- LA volume/bsa, ES, 1-p A4C 28.6 ml/m^2 --------- LA volume, ES, 1-p A2C 86 ml --------- LA volume/bsa, ES, 1-p A2C 41 ml/m^2 ---------  Mitral valve Value Reference Mitral E-wave peak velocity 86.4 cm/s --------- Mitral A-wave peak velocity 108 cm/s --------- Mitral deceleration time (H) 289 ms 150 - 230 Mitral peak gradient, D 3 mm Hg --------- Mitral E/A ratio, peak 0.8 ---------  Pulmonary arteries Value Reference PA pressure, S, DP 27 mm Hg <=30  Tricuspid valve Value Reference Tricuspid regurg peak velocity 247 cm/s --------- Tricuspid peak RV-RA gradient 24 mm Hg ---------  Systemic veins Value Reference Estimated CVP 3 mm Hg ---------  Right ventricle Value Reference RV pressure, S, DP 27 mm Hg <=30 RV s&', lateral, S 8.68 cm/s ---------  Legend: (L) and (H) mark values outside  specified reference range.  ------------------------------------------------------------------- Prepared and Electronically Authenticated by  Ena Dawley, M.D. 2017-01-09T18:11:59   CARDIAC CATHETERIZATION Procedures    Right/Left Heart Cath and Coronary/Graft Angiography    Conclusion     Ost LM lesion, 40% stenosed.  Ost Cx to Prox Cx lesion, 90% stenosed.  Ost LAD to Prox LAD lesion, 75% stenosed.  Ost Ramus lesion, 99% stenosed.  Mid LAD lesion, 50%  stenosed.  Prox RCA lesion, 50% stenosed.  Dist RCA lesion, 25% stenosed.  SVG .  Widely patent SVG-diagonal  SVG was injected is normal in caliber.  Widely patent sequential SVG-Ramus and OM2  SVG was injected .  Origin lesion, 100% stenosed.  1. Severe 3 vessel CAD with diffuse calcific LAD stenosis and severe proximal LCx stenosis 2. Calcified RCA with mild diffuse nonobstructive disease 3. S/P aortocoronary bypass with patent LIMA-LAD, SVG-diagonal, and sequential SVG-ramus and OM2. Chronic occlusion of the SVG-right PDA 4. Moderate-severe aortic stenosis with mean gradient 28 mmHg and calculated AVA 1.08 square cm  Continued evaluation for TAVR. Pt clearly has severe aortic stenosis by echo criteria and progressive clinical symptoms.     Indications    Severe aortic stenosis [I35.0 (ICD-10-CM)]    Technique and Indications    INDICATION: 81 yo male with prior CABG, now with NYHA 2 symptoms of progressive angina and dyspnea, found to have severe aortic stenosis by echo criteria  PROCEDURAL DETAILS: The right groin was prepped, draped, and anesthetized with 1% lidocaine. Using the modified Seldinger technique a 5 French sheath was placed in the right femoral artery and a 7 French sheath was placed in the right femoral vein. A Swan-Ganz catheter was used for the right heart catheterization. Standard protocol was followed for recording of right heart pressures and sampling of oxygen saturations. Fick cardiac output was calculated. Standard Judkins catheters were used for selective coronary angiography and left ventriculography. There were no immediate procedural complications. The patient was transferred to the post catheterization recovery area for further monitoring.  During this procedure the patient is administered a total of Versed 1 mg and Fentanyl 25 mg to achieve and maintain moderate conscious sedation. [name] heart rate, blood pressure, and oxygen  saturation are monitored continuously during the procedure. The period of conscious sedation is 21 minutes, of which I was present face-to-face 100% of this time. Estimated blood loss <50 mL. There were no immediate complications during the procedure.    Coronary Findings    Dominance: Right   Left Main   . Ost LM lesion, 40% stenosed. Calcified. Mild stenosis in the mid-portion of the left main     Left Anterior Descending   . Ost LAD to Prox LAD lesion, 75% stenosed. Calcified.   . Mid LAD lesion, 50% stenosed. Calcified diffuse.     Ramus Intermedius  . Vessel is small.   Colon Flattery Ramus lesion, 99% stenosed. Calcified.     Left Circumflex   . Ost Cx to Prox Cx lesion, 90% stenosed. Calcified eccentric.     Right Coronary Artery   . Prox RCA lesion, 50% stenosed. Calcified.   . Dist RCA lesion, 25% stenosed. Calcified.     Graft Angiography    Free Graft to 1st Diag  SVG Widely patent SVG-diagonal     Sequential single graft Graft to Ramus, 2nd Mrg  SVG was injected is normal in caliber. Widely patent sequential SVG-Ramus and OM2     Free Graft to RPDA  SVG was injected .   [name]  Origin lesion, 100% stenosed. Chronically occluded           Right Heart Pressures Hemodynamic findings consistent with aortic stenosis. LV EDP is normal.    Left Heart    Aortic Valve There is severe aortic valve stenosis. The aortic valve is calcified. Mean gradient 28 mmHg, peak-to-peak gradient 29 mmHg, AVA 1.08 square cm, AVA index 0.55    Coronary Diagrams    Diagnostic Diagram            Implants     No implant documentation for this case.    PACS Images    Show images for Cardiac catheterization     Link to Procedure Log    Procedure Log      Hemo Data       Most Recent Value   Fick Cardiac Output  5.32 L/min   Fick Cardiac Output Index  2.7 (L/min)/BSA   Aortic Mean Gradient  28 mmHg   Aortic Peak Gradient  29 mmHg   Aortic Valve Area   1.08   Aortic Value Area Index  0.55 cm2/BSA   RA A Wave  8 mmHg   RA V Wave  7 mmHg   RA Mean  6 mmHg   RV Systolic Pressure  31 mmHg   RV Diastolic Pressure  1 mmHg   RV EDP  8 mmHg   PA Systolic Pressure  28 mmHg   PA Diastolic Pressure  8 mmHg   PA Mean  17 mmHg   PW A Wave  11 mmHg   PW V Wave  6 mmHg   PW Mean  6 mmHg   AO Systolic Pressure  123456 mmHg   AO Diastolic Pressure  68 mmHg   AO Mean  94 mmHg   LV Systolic Pressure  0000000 mmHg   LV Diastolic Pressure  2 mmHg   LV EDP  13 mmHg   Arterial Occlusion Pressure Extended Systolic Pressure  123456 mmHg   Arterial Occlusion Pressure Extended Diastolic Pressure  64 mmHg   Arterial Occlusion Pressure Extended Mean Pressure  94 mmHg   Left Ventricular Apex Extended Systolic Pressure  0000000 mmHg   Left Ventricular Apex Extended Diastolic Pressure  1 mmHg   Left Ventricular Apex Extended EDP Pressure  12 mmHg   QP/QS  1   TPVR Index  6.29 HRUI   TSVR Index  34.77 HRUI   PVR SVR Ratio  0.13   TPVR/TSVR Ratio  0.18     Cardiac TAVR CT TECHNIQUE: The patient was scanned on a Philips [id number] scanner. A 120 kV retrospective scan was triggered in the descending thoracic aorta at 111 HU's. Gantry rotation speed was 270 msecs and collimation was .9 mm. No beta blockade or nitro were given. The 3D data set was reconstructed in 5% intervals of the R-R cycle. Systolic and diastolic phases were analyzed on a dedicated work station using MPR, MIP and VRT modes. The patient received 80 cc of contrast. FINDINGS: Aortic Valve: Trileaflet and heavily calcified. The annulus is very elliptical Aorta: Mild calcification of the arch Sinotubular Junction: 30 mm Ascending Thoracic Aorta: 33.7 mm Aortic Arch: 25 mm Descending Thoracic Aorta: 29 mm Sinus of Valsalva Measurements: Non-coronary: 33 mm Right -coronary: 31 mm Left -coronary: 32 mm Coronary Artery Height above Annulus: Left Main: 12.8 mm Right  Coronary: 20 mm Virtual Basal Annulus Measurements: Maximum/Minimum Diameter: 31 mm x 22 mm Perimeter: 88 mm Area: 566 mm2 Coronary Arteries: Sufficient height above annulus  for delivery Optimum Fluoroscopic Angle for Delivery: AP IMPRESSION: 1) Calcified trileaflet AV with annulus 566 mm2 suitable for a 29 mm Sapien 3 valve 2) Sufficient coronary height for delivery 3) Optimum angiographic angle for delivery is AP 4) RV enlargement with pacing wires 5) Mild calcification of the aortic arch Jenkins Rouge Electronically Signed  By: Jenkins Rouge M.D.  On: 05/11/2015 14:05    CT ANGIOGRAPHY CHEST, ABDOMEN AND PELVIS  TECHNIQUE: Multidetector CT imaging through the chest, abdomen and pelvis was performed using the standard protocol during bolus administration of intravenous contrast. Multiplanar reconstructed images and MIPs were obtained and reviewed to evaluate the vascular anatomy.  CONTRAST: 158mL OMNIPAQUE IOHEXOL 300 MG/ML SOLN  COMPARISON: Cardiac CT 05/11/2015. CT the abdomen pelvis 01/09/2012.  FINDINGS: CTA CHEST FINDINGS  Mediastinum/Lymph Nodes: Heart size is borderline enlarged. There is no significant pericardial fluid, thickening or pericardial calcification. There is atherosclerosis of the thoracic aorta, the great vessels of the mediastinum and the coronary arteries, including calcified atherosclerotic plaque in the left main, left anterior descending, left circumflex and right coronary arteries. Status post median sternotomy for CABG, including LIMA to the LAD. Severe thickening and calcification of the aortic valve. No pathologically enlarged mediastinal or hilar lymph nodes. Esophagus is unremarkable in appearance. No axillary lymphadenopathy. Right-sided pacemaker device in position with lead tips terminating in the right atrial appendage and right ventricular apex.  Lungs/Pleura: No acute consolidative airspace disease. No  pleural effusions. No suspicious appearing pulmonary nodules or masses. Mild diffuse bronchial thickening. Mild paraseptal emphysema noted predominantly in the lung apices.  Musculoskeletal/Soft Tissues: Median sternotomy wires. There are no aggressive appearing lytic or blastic lesions noted in the visualized portions of the skeleton.  CTA ABDOMEN AND PELVIS FINDINGS  Hepatobiliary: The liver has a slightly shrunken appearance and nodular contour, suggestive of cirrhosis. No discrete cystic or solid hepatic lesions are identified. No intra or extrahepatic biliary ductal dilatation. Gallbladder is normal in appearance.  Pancreas: The pancreatic mass. No pancreatic ductal dilatation. No pancreatic or peripancreatic fluid or inflammatory changes.  Spleen: Unremarkable.  Adrenals/Urinary Tract: Calcifications in the left renal hila are favored to be vascular, although the presence of nonobstructive calculi is not excluded. Sub cm low-attenuation lesion in the interpolar region of the left kidney is too small to definitively characterize, but is statistically likely a small cyst. Right kidney and bilateral adrenal glands are normal in appearance. No hydroureteronephrosis or perinephric stranding to indicate urinary tract obstruction at this time. The appearance of the urinary bladder is normal.  Stomach/Bowel: The appearance of the stomach is normal. No pathologic dilatation of small bowel or colon.  Vascular/Lymphatic: Extensive atherosclerosis throughout the abdominal and pelvic vasculature, including fusiform aneurysmal dilatation of the infrarenal abdominal aorta which measures up to 4.1 x 3.8 cm, shortly above the bifurcation. There is a large burden of atheromatous plaque and/or mural thrombus within the aneurysmally dilated segment of the infrarenal abdominal aorta. 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  Vascular findings and measurements pertinent to potential TAVR procedure, as detailed below. No lymphadenopathy noted in the abdomen or pelvis.  Reproductive: Prostate gland is either diminutive or surgically absent. Seminal vesicles are unremarkable in appearance.  Other: Trace volume of ascites in the low anatomic pelvis. No larger volume of ascites. No pneumoperitoneum.  Musculoskeletal: There are no aggressive appearing lytic or blastic lesions noted in the visualized portions of the skeleton.  VASCULAR MEASUREMENTS PERTINENT TO TAVR:  AORTA:  Minimal Aortic Diameter - 19 x 17 mm  Severity of Aortic Calcification - moderate to severe  RIGHT PELVIS:  Right Common Iliac Artery -  Minimal Diameter - 7.9 x 8.3 mm  Tortuosity - mild  Calcification - moderate to severe  Right External Iliac Artery -  Minimal Diameter - 3.4 x 3.7 mm  Tortuosity - severe  Calcification - mild  Right Common Femoral Artery -  Minimal Diameter - 6.5 x 4.8 mm  Tortuosity - mild  Calcification - mild to moderate  LEFT PELVIS:  Left Common Iliac Artery -  Minimal Diameter - 8.4 x 8.4 mm  Tortuosity - mild  Calcification - moderate-to-severe  Left External Iliac Artery -  Minimal Diameter - 5.7 x 5.0 mm  Tortuosity - severe  Calcification - mild  Left Common Femoral Artery -  Minimal Diameter - 7.6 x 4.9 mm  Tortuosity - mild  Calcification - moderate  Review of the MIP images confirms the above findings.  IMPRESSION: 1. Vascular findings and measurements pertinent to potential TAVR procedure, as detailed above. This patient does not appear to have suitable pelvic arterial access on either side (particularly on the right side) secondary to the small caliber of the vessels and extreme tortuosity of the external iliac segments. 2. Severe calcifications of the aortic valve, compatible with the reported clinical history of severe  aortic stenosis. 3. Mild diffuse bronchial wall thickening with mild paraseptal emphysema; imaging findings suggestive of underlying COPD. 4. Atherosclerosis, including left main and 3 vessel coronary artery disease. Status post median sternotomy for CABG, including LIMA to the LAD. In addition, there is aneurysmal dilatation of the infrarenal abdominal aorta which measures up to 4.1 x 3.8 cm, and contains a large burden of atheromatous plaque and/or mural thrombus within the aneurysmal segment. 5. Morphologic changes in the liver compatible with underlying cirrhosis. 6. Additional incidental findings, as above.   Electronically Signed  By: Vinnie Langton M.D.  On: 05/22/2015 15:02   STS Risk Calculator  Procedure    AVR + redo CABG  Risk of Mortality   8.4% Morbidity or Mortality  37.6% Prolonged LOS   18.2% Short LOS    15.1% Permanent Stroke   3.3% Prolonged Vent Support  24.1% DSW Infection    0.5% Renal Failure    13.8% Reoperation    15.5%   Impression:  Patient has stage D severe symptomatically ordered stenosis. He presents with a two-year history of progressive symptoms of exertional shortness of breath and fatigue consistent with chronic diastolic congestive heart failure, New York Heart Association functional class II. I have personally reviewed the patient's recent transthoracic echocardiogram, diagnostic catheterization and CT angiograms. There is severe thickening with restricted leaflet mobility and calcification involving all 3 leaflets of the patient's aortic valve. Peak velocity across the aortic valve was measured between 4.0 and 4.2 m/sec corresponding to mean transvalvular gradient estimated 40 mmHg. Left ventricular systolic function remains preserved.  The patient has known history of 3-vessel coronary artery disease status post coronary artery bypass grafting in the remote past.  There is continued patency of the left internal mammary artery graft to  the left anterior descending coronary artery and the vein graft to the left circumflex system.  There is moderate disease without high-grade flow-limiting stenosis in the native right coronary artery. The vein graft to the right coronary system is chronically occluded.  I agree that the patient would benefit from aortic valve replacement. Risks associated with conventional surgery with  or without redo coronary artery bypass grafting would be relatively high risk.  Cardiac gated CT angiogram of the heart demonstrates findings consistent with aortic stenosis with anatomical features suitable for transcatheter aortic valve replacement without any significant complicating features.  CT angiogram of the aorta and iliac vessels demonstrates the presence of diffuse atherosclerotic disease with a small infrarenal abdominal aortic aneurysm.  I feel that he may have adequate pelvic vasculature to facilitate a transfemoral approach, although alternative access might be necessary.  I agree that transcatheter aortic valve replacement would clearly be a far less invasive than likely less risky alternatives and high-risk conventional surgery.   Plan:  The patient was counseled at length regarding treatment alternatives for management of severe symptomatic aortic stenosis. Alternative approaches such as conventional aortic valve replacement with or without redo coronary bypass grafting, transcatheter aortic valve replacement, and palliative medical therapy were compared and contrasted at length.  The risks associated with conventional surgical aortic valve replacement were been discussed in detail, as were expectations for post-operative convalescence. Long-term prognosis with medical therapy was discussed. This discussion was placed in the context of the patient's own specific clinical presentation and past medical history.  All of their questions been addressed.  The patient has been scheduled for transcatheter aortic valve  replacement next week.  Following the decision to proceed with transcatheter aortic valve replacement, a discussion has been held regarding what types of management strategies would be attempted intraoperatively in the event of life-threatening complications, including whether or not the patient would be considered a candidate for the use of cardiopulmonary bypass and/or conversion to open sternotomy for attempted surgical intervention.  The patient has been advised of a variety of complications that might develop including but not limited to risks of death, stroke, paravalvular leak, aortic dissection or other major vascular complications, aortic annulus rupture, device embolization, cardiac rupture or perforation, mitral regurgitation, acute myocardial infarction, arrhythmia, heart block or bradycardia requiring permanent pacemaker placement, congestive heart failure, respiratory failure, renal failure, pneumonia, infection, other late complications related to structural valve deterioration or migration, or other complications that might ultimately cause a temporary or permanent loss of functional independence or other long term morbidity.  The patient provides full informed consent for the procedure as described and all questions were answered.       Valentina Gu. Roxy Manns, MD 06/05/2015 4:16 PM

## 2015-06-05 NOTE — Patient Instructions (Signed)
   Patient should continue taking all current medications without change through the day before surgery.  Patient should have nothing to eat or drink after midnight the night before surgery.  On the morning of surgery patient should take only Metoprolol and Zantac with a sip of water.

## 2015-06-08 ENCOUNTER — Encounter (HOSPITAL_COMMUNITY)
Admission: RE | Admit: 2015-06-08 | Discharge: 2015-06-08 | Disposition: A | Discharge status: Home / Self Care | Payer: Medicare Other | Source: Ambulatory Visit | Attending: Surgery | Admitting: Surgery

## 2015-06-08 ENCOUNTER — Encounter (HOSPITAL_COMMUNITY): Payer: Self-pay

## 2015-06-08 ENCOUNTER — Ambulatory Visit (HOSPITAL_COMMUNITY)
Admission: RE | Admit: 2015-06-08 | Discharge: 2015-06-08 | Disposition: A | Discharge status: Home / Self Care | Payer: Medicare Other | Source: Ambulatory Visit | Attending: Surgery | Admitting: Surgery

## 2015-06-08 VITALS — BP 138/73 | HR 60 | Temp 98.9°F | Resp 20 | Ht 66.0 in | Wt 196.9 lb

## 2015-06-08 DIAGNOSIS — Z0183 Encounter for blood typing: Secondary | ICD-10-CM | POA: Insufficient documentation

## 2015-06-08 DIAGNOSIS — Z01818 Encounter for other preprocedural examination: Secondary | ICD-10-CM | POA: Diagnosis not present

## 2015-06-08 DIAGNOSIS — I350 Nonrheumatic aortic (valve) stenosis: Secondary | ICD-10-CM | POA: Diagnosis not present

## 2015-06-08 DIAGNOSIS — Z01812 Encounter for preprocedural laboratory examination: Secondary | ICD-10-CM | POA: Insufficient documentation

## 2015-06-08 DIAGNOSIS — R001 Bradycardia, unspecified: Secondary | ICD-10-CM | POA: Insufficient documentation

## 2015-06-08 HISTORY — DX: Pneumonia, unspecified organism: J18.9

## 2015-06-08 HISTORY — DX: Unspecified osteoarthritis, unspecified site: M19.90

## 2015-06-08 HISTORY — DX: Angina pectoris, unspecified: I20.9

## 2015-06-08 LAB — BLOOD GAS, ARTERIAL
Acid-Base Excess: 0.9 mmol/L (ref 0.0–2.0)
BICARBONATE: 24.7 meq/L — AB (ref 20.0–24.0)
Drawn by: 206361
FIO2: 0.21
O2 SAT: 95.1 %
PATIENT TEMPERATURE: 98.6
PH ART: 7.435 (ref 7.350–7.450)
PO2 ART: 74.6 mmHg — AB (ref 80.0–100.0)
TCO2: 25.9 mmol/L (ref 0–100)
pCO2 arterial: 37.4 mmHg (ref 35.0–45.0)

## 2015-06-08 LAB — URINALYSIS, ROUTINE W REFLEX MICROSCOPIC
BILIRUBIN URINE: NEGATIVE
Glucose, UA: NEGATIVE mg/dL
KETONES UR: NEGATIVE mg/dL
Leukocytes, UA: NEGATIVE
NITRITE: NEGATIVE
PROTEIN: NEGATIVE mg/dL
SPECIFIC GRAVITY, URINE: 1.013 (ref 1.005–1.030)
pH: 7.5 (ref 5.0–8.0)

## 2015-06-08 LAB — PROTIME-INR
INR: 1.17 (ref 0.00–1.49)
PROTHROMBIN TIME: 15 s (ref 11.6–15.2)

## 2015-06-08 LAB — COMPREHENSIVE METABOLIC PANEL
ALBUMIN: 3.7 g/dL (ref 3.5–5.0)
ALT: 27 U/L (ref 17–63)
ANION GAP: 10 (ref 5–15)
AST: 40 U/L (ref 15–41)
Alkaline Phosphatase: 66 U/L (ref 38–126)
BUN: 14 mg/dL (ref 6–20)
CALCIUM: 9.5 mg/dL (ref 8.9–10.3)
CO2: 21 mmol/L — ABNORMAL LOW (ref 22–32)
Chloride: 107 mmol/L (ref 101–111)
Creatinine, Ser: 1.05 mg/dL (ref 0.61–1.24)
GFR calc non Af Amer: >60 mL/min (ref >60)
GLUCOSE: 119 mg/dL — AB (ref 65–99)
POTASSIUM: 4.4 mmol/L (ref 3.5–5.1)
Sodium: 138 mmol/L (ref 135–145)
TOTAL PROTEIN: 7.3 g/dL (ref 6.5–8.1)
Total Bilirubin: 1.1 mg/dL (ref 0.3–1.2)

## 2015-06-08 LAB — URINE MICROSCOPIC-ADD ON

## 2015-06-08 LAB — SURGICAL PCR SCREEN
MRSA, PCR: POSITIVE — AB
STAPHYLOCOCCUS AUREUS: POSITIVE — AB

## 2015-06-08 LAB — CBC
HEMATOCRIT: 38.8 % — AB (ref 39.0–52.0)
Hemoglobin: 13.2 g/dL (ref 13.0–17.0)
MCH: 31.1 pg (ref 26.0–34.0)
MCHC: 34 g/dL (ref 30.0–36.0)
MCV: 91.5 fL (ref 78.0–100.0)
Platelets: 123 10*3/uL — ABNORMAL LOW (ref 150–400)
RBC: 4.24 MIL/uL (ref 4.22–5.81)
RDW: 12.9 % (ref 11.5–15.5)
WBC: 4.1 10*3/uL (ref 4.0–10.5)

## 2015-06-08 LAB — APTT: aPTT: 34 seconds (ref 24–37)

## 2015-06-08 LAB — ABO/RH: ABO/RH(D): A POS

## 2015-06-08 NOTE — Pre-Procedure Instructions (Signed)
[name]  06/08/2015      [facility] - [place] - [address] [place] [address] Phone: [phone] Fax: [phone]    Your procedure is scheduled on Tues, Mar 28 @ 7:30 AM  Report to [facility] Admitting at 5:30 AM  Call this number if you have problems the morning of surgery:  [phone]   Remember:  Do not eat food or drink liquids after midnight.  Take these medicines the morning of surgery with A SIP OF WATER      Do not wear jewelry.  Do not wear lotions, powders, or perfumes.               Men may shave face and neck.  Do not bring valuables to the hospital.  [facility] is not responsible for any belongings or valuables.  Contacts, dentures or bridgework may not be worn into surgery.  Leave your suitcase in the car.  After surgery it may be brought to your room.  For patients admitted to the hospital, discharge time will be determined by your treatment team.  Patients discharged the day of surgery will not be allowed to drive home.    Special instructions:  [facility] - Preparing for Surgery  Before surgery, you can play an important role.  Because skin is not sterile, your skin needs to be as free of germs as possible.  You can reduce the number of germs on you skin by washing with CHG (chlorahexidine gluconate) soap before surgery.  CHG is an antiseptic cleaner which kills germs and bonds with the skin to continue killing germs even after washing.  Please DO NOT use if you have an allergy to CHG or antibacterial soaps.  If your skin becomes reddened/irritated stop using the CHG and inform your nurse when you arrive at Short Stay.  Do not shave (including legs and underarms) for at least 48 hours prior to the first CHG shower.  You may shave your face.  Please follow these instructions carefully:   1.  Shower with CHG Soap the night before surgery and the                                 morning of Surgery.  2.  If you choose to wash your hair, wash your hair first as usual with your       normal shampoo.  3.  After you shampoo, rinse your hair and body thoroughly to remove the                      Shampoo.  4.  Use CHG as you would any other liquid soap.  You can apply chg directly       to the skin and wash gently with scrungie or a clean washcloth.  5.  Apply the CHG Soap to your body ONLY FROM THE NECK DOWN.        Do not use on open wounds or open sores.  Avoid contact with your eyes,       ears, mouth and genitals (private parts).  Wash genitals (private parts)       with your normal soap.  6.  Wash thoroughly, paying special attention to the area where your surgery        will be performed.  7.  Thoroughly  rinse your body with warm water from the neck down.  8.  DO NOT shower/wash with your normal soap after using and rinsing off       the CHG Soap.  9.  Pat yourself dry with a clean towel.            10.  Wear clean pajamas.            11.  Place clean sheets on your bed the night of your first shower and do not        sleep with pets.  Day of Surgery  Do not apply any lotions/deoderants the morning of surgery.  Please wear clean clothes to the hospital/surgery center.    Please read over the following fact sheets that you were given. Pain Booklet, Coughing and Deep Breathing, Blood Transfusion Information, MRSA Information and Surgical Site Infection Prevention

## 2015-06-08 NOTE — Pre-Procedure Instructions (Signed)
    [name]  06/08/2015      [facility] - [place], [place] - [address] [name] Phone: [phone] Fax: [phone]     Report to [facility] Admitting at 5:30 AM                  Your surgery or procedure is scheduled for 7:30 AM   Call this number if you have problems the morning of surgery:  [phone]   Remember:  Do not eat food or drink liquids after midnight Monday March 27.  Take these medicines the morning of surgery with A SIP OF WATER : Zantac.               Take if needed: Tylenol.                Stop taking Aspirin, Coumadin, Plavix, Effient and Herbal medications.  Do not take any NSAIDs ie: Ibuprofen,  Advil,Naproxen or any medication containing Aspirin.   Do not wear jewelry.  Do not wear lotions, powders, or perfumes.               Men may shave face and neck.  Do not bring valuables to the hospital.  [facility] is not responsible for any belongings or valuables.  Contacts, dentures or bridgework may not be worn into surgery.  Leave your suitcase in the car.  After surgery it may be brought to your room.  For patients admitted to the hospital, discharge time will be determined by your treatment team.  Please read over the following fact sheets that you were given. Pain Booklet, Coughing and Deep Breathing, Blood Transfusion Information, MRSA Information and Surgical Site Infection Prevention

## 2015-06-08 NOTE — Progress Notes (Signed)
I called a prescription for Mupirocin ointment to [facility], [place], [place].  I called patient and instructeed him to begin treatment and to bring it to the hospital with him on day of surgery.  I called and left a message for Harrison Mons at Dr Vivi Martens office and informed her of Postive nasal swab and to check urine results.

## 2015-06-09 LAB — HEMOGLOBIN A1C
Hgb A1c MFr Bld: 5.6 % (ref 4.8–5.6)
Mean Plasma Glucose: 114 mg/dL

## 2015-06-11 MED ORDER — SODIUM CHLORIDE 0.9 % IV SOLN
INTRAVENOUS | Status: DC
  Filled 2015-06-11: qty 2.5

## 2015-06-11 MED ORDER — CHLORHEXIDINE GLUCONATE 0.12 % MT SOLN
15.0000 mL | Freq: Once | OROMUCOSAL | Status: AC
  Administered 2015-06-12: 15 mL via OROMUCOSAL
  Filled 2015-06-11: qty 15

## 2015-06-11 MED ORDER — DEXMEDETOMIDINE HCL IN NACL 400 MCG/100ML IV SOLN
0.1000 ug/kg/h | INTRAVENOUS | Status: DC
  Filled 2015-06-11: qty 100

## 2015-06-11 MED ORDER — MAGNESIUM SULFATE 50 % IJ SOLN
40.0000 meq | INTRAMUSCULAR | Status: DC
  Filled 2015-06-11: qty 10

## 2015-06-11 MED ORDER — EPINEPHRINE HCL 1 MG/ML IJ SOLN
0.0000 ug/min | INTRAVENOUS | Status: DC
  Filled 2015-06-11: qty 4

## 2015-06-11 MED ORDER — VANCOMYCIN HCL 10 G IV SOLR
1500.0000 mg | INTRAVENOUS | Status: AC
  Administered 2015-06-12: 1500 mg via INTRAVENOUS
  Filled 2015-06-11: qty 1500

## 2015-06-11 MED ORDER — POTASSIUM CHLORIDE 2 MEQ/ML IV SOLN
80.0000 meq | INTRAVENOUS | Status: DC
  Filled 2015-06-11: qty 40

## 2015-06-11 MED ORDER — PHENYLEPHRINE HCL 10 MG/ML IJ SOLN
30.0000 ug/min | INTRAMUSCULAR | Status: DC
  Filled 2015-06-11: qty 2

## 2015-06-11 MED ORDER — DOPAMINE-DEXTROSE 3.2-5 MG/ML-% IV SOLN
0.0000 ug/kg/min | INTRAVENOUS | Status: DC
  Filled 2015-06-11: qty 250

## 2015-06-11 MED ORDER — DEXTROSE 5 % IV SOLN
1.5000 g | INTRAVENOUS | Status: AC
  Administered 2015-06-12: 1.5 g via INTRAVENOUS
  Filled 2015-06-11 (×2): qty 1.5

## 2015-06-11 MED ORDER — NITROGLYCERIN IN D5W 200-5 MCG/ML-% IV SOLN
2.0000 ug/min | INTRAVENOUS | Status: DC
  Filled 2015-06-11: qty 250

## 2015-06-11 MED ORDER — NOREPINEPHRINE BITARTRATE 1 MG/ML IV SOLN
0.0000 ug/min | INTRAVENOUS | Status: DC
  Filled 2015-06-11: qty 4

## 2015-06-11 MED ORDER — SODIUM CHLORIDE 0.9 % IV SOLN
INTRAVENOUS | Status: DC
  Filled 2015-06-11: qty 30

## 2015-06-11 NOTE — H&P (Signed)
[address]       [place] [address]             [phone]      Cardiothoracic Surgery History and Physical   Referring Provider is Sherren Mocha, MD PCP is [name], MD  Chief Complaint  Patient presents with  . Aortic Stenosis        HPI:  The patient is a 81 year old gentleman with a history of coronary artery disease s/p CABG x 5 by me in 1997, PAF, sinus bradycardia s/p PPM followed by Dr. [name], PVD, COPD and OSA, as well as aortic stenosis. For the past couple years he has noticed some exertional dyspnea, fatigue and chest discomfort. His symptoms have been progressing but he says he still feels well. His most recent echo in January showed an increase in the mean gradient to 40 mm Hg with a peak of 72 mm Hg and mild to moderate AI. The AVA was 0.7 cm2. LVEF was 60-65%. He was evaluated by Dr. Burt Knack and had a cath on 05/04/2015 showing severe 3 vessel CAD with a calcified RCA with mild diffuse nonobstructive disease. The vein graft to the PDA is chronically occluded. The other bypasses are patent. The mean AV gradient is 28 mm Hg with an AVA of 1.08 cm2. Gated cardiac CT showed a trileaflet AV with anatomy suitable for a 29 mm Sapient 3 valve. Abdominal and pelvic CT showed small caliber external iliac arteries that did not appear to be adequate for transfemoral access.  Past Medical History  Diagnosis Date  . CAD (coronary artery disease)     a. s/p MI/PTCA - Cx 1994. b. s/p CABG x5 in 1997. c. Abnl nuc 2003- occ SVG-RCA, patent seq SVG-OM1-dCxOM, patentl LIMA-LAD, patent, SVG-diag. d. Normal nuc 09/2012.  [name] PAF (paroxysmal atrial fibrillation) ([place] [facility])     a. Remote per records.   . Syncope 11/28/97  . AAA (abdominal aortic aneurysm) ([facility])     a. Last duplex - 3.8cm 01/2013 - due 01/2014.  . OSA on CPAP   . H/O prostate cancer     a. s/p radical retropubic prostatectomy with bilateral pelvic  lymph node dissection  . Aortic stenosis, mild     Last echo 05/04/12 +LVH  . Back pain     hx epidural injections  . Acne rosacea   . H/O: GI bleed     mild, neg. colonoscopy  . Hyperlipidemia   . Thrombocytopenia (HCC)     chronic, mild  . GERD (gastroesophageal reflux disease)   . COPD (chronic obstructive pulmonary disease) ([facility])   . HTN (hypertension)   . Baker's cyst     Lt.  [name] PVD (peripheral vascular disease) ([facility])     a. Rt ext iliac stenosis, last PV cath 2003, moderate stenosis last Lower Ext Dopplers 12/16/11 - Rt. ABI 0.96 Lt ABI 1.0.  . Pacemaker -Mono with change out 2006  . Sinus node dysfunction ([facility]) 01/20/2013  . Hypertensive heart disease   . Sick sinus syndrome ([place])     a. placed 1999, gen change 2011 - St. Jude.  . Valvular heart disease     a. Echo 04/2012: mild-mod AS, mild AI, mild MR.  . Carotid artery disease ([facility])     a. Duplex 01/2013: mildly abnormal. Mild plaque without significant diameter reduction. Repeat recommended 11/15.  [name] HOH (hard of hearing)   .  Dizziness and giddiness 08/04/2013  . Cancer [facility])     Past Surgical History  Procedure Laterality Date  . Coronary artery bypass graft  1997    LIMA-LAD; VG-diag; seq VG-1st OM & distal LCX; VG-RCA  . Pacemaker insertion  11/28/97    pacesetter--ERI 2011  . Pacemaker generator change  08/15/2009    St. Jude accent  . Suprapubic prostatectomy    . Cardiac catheterization  2003    VG to RCA occluded, other grafts patent  . Coronary angioplasty  03/29/92    PTCA to LCX  . Cataracts    . Cataract extraction Bilateral   . Cardiac catheterization N/A 05/04/2015    Procedure: Right/Left Heart Cath and Coronary/Graft Angiography; Surgeon: Sherren Mocha, MD; Location: [facility]; Service: Cardiovascular; Laterality: N/A;     Family History  Problem Relation Age of Onset  . CAD    . Colon cancer Father   . Cancer Brother   . Heart Problems Brother   . Heart Problems Sister     Social History   Social History  . Marital Status: Widowed    Spouse Name: N/A  . Number of Children: 5  . Years of Education: 7th   Occupational History  . Retired    Social History Main Topics  . Smoking status: Former Smoker -- 2.00 packs/day for 48 years    Types: Cigarettes    Quit date: 03/17/1993  . Smokeless tobacco: Never Used  . Alcohol Use: Yes     Comment: occ  . Drug Use: No  . Sexual Activity: Not on file   Other Topics Concern  . Not on file   Social History Narrative    Current Outpatient Prescriptions  Medication Sig Dispense Refill  . aspirin EC 81 MG tablet Take 81 mg by mouth every evening.    . metoprolol tartrate (LOPRESSOR) 25 MG tablet Take 0.5 tablets (12.5 mg total) by mouth 2 (two) times daily.    . nitroGLYCERIN (NITROSTAT) 0.4 MG SL tablet Place 1 tablet (0.4 mg total) under the tongue as needed. 25 tablet 3  . Omega-3 Fatty Acids (FISH OIL) 1200 MG CAPS Take 1,200 mg by mouth every evening.    . ranitidine (ZANTAC) 150 MG tablet Take 150 mg by mouth daily as needed for heartburn.    . simvastatin (ZOCOR) 80 MG tablet TAKE ONE TABLET BY MOUTH AT BEDTIME 90 tablet 3  . triamterene-hydrochlorothiazide (DYAZIDE) 37.5-25 MG per capsule Take 1 capsule by mouth daily - Monday through Friday 90 capsule 3  . vitamin C (ASCORBIC ACID) 500 MG tablet Take 500 mg by mouth every evening.     No current facility-administered medications for this visit.    No Known Allergies    Review of Systems:  General:normal appetite, decreased energy, no weight gain, no weight loss, no  fever Cardiac:occasional chest pain with exertion, no chest pain at rest, mild SOB with moderate exertion, no resting SOB, no PND, no orthopnea, no palpitations, Hx of arrhythmia, Paroxysmal atrial fibrillation, no LE edema, no dizzy spells, no syncope Respiratory:has shortness of breath, no home oxygen, no productive cough, no dry cough, no bronchitis, no wheezing, no hemoptysis, no asthma, no pain with inspiration or cough, has sleep apnea, wears CPAP at night GI:no difficulty swallowing, no reflux, no frequent heartburn, no hiatal hernia, no abdominal pain, no constipation, no diarrhea, no hematochezia, no hematemesis, no melena GU:no dysuria, no frequency, no urinary tract infection, no hematuria, no enlarged prostate, no  kidney stones, no kidney disease Vascular:no pain suggestive of claudication, no pain in feet, no leg cramps, no varicose veins, no DVT, no non-healing foot ulcer Neuro:no stroke, no TIA's, no seizures, no headaches, no temporary blindness one eye, no slurred speech, no peripheral neuropathy, no chronic pain, no instability of gait, no memory/cognitive dysfunction Musculoskeletal:has arthritis, no joint swelling, no myalgias, no difficulty walking, no mobility  Skin:no rash, no itching, no skin infections, no pressure sores or ulcerations Psych:no anxiety, no depression, no nervousness, no unusual recent stress Eyes:no blurry vision, no floaters, no recent vision changes, does not wear glasses or contacts ENT:no hearing loss, no loose or painful teeth, partial dentures, last saw dentist this  year Hematologic:no easy bruising, no abnormal bleeding, no clotting disorder, no frequent epistaxis Endocrine:no diabetes, does not check CBG's at home       Physical Exam:  BP 136/86 mmHg  Pulse 74  Resp 20  Ht 5\' 6"  (1.676 m)  Wt 195 lb (88.451 kg)  BMI 31.49 kg/m2  SpO2 96% General:Elderly but well-appearing HEENT:Unremarkable , NCAT, PERLA, EOMI, oropharynx clear Neck:no JVD, no bruits, no adenopathy or thyromegaly Chest:clear to auscultation, symmetrical breath sounds, no wheezes, no rhonchi  CV:RRR, grade III/VI crescendo/decrescendo murmur heard best at RSB, no diastolic murmur Abdomen:soft, non-tender, no masses or organomegaly Extremities:warm, well-perfused, pulses diminished in feet, no LE edema, right leg SV harvest incisions. Rectal/GUDeferred Neuro:Grossly non-focal and symmetrical throughout Skin:Clean and dry, no rashes, no breakdown   Diagnostic Tests:   [facility]  [address], [place] [address]  [phone]  ------------------------------------------------------------------- Transthoracic Echocardiography  Patient: Taurin, Guilfoyle MR #: [id number] Study Date: 03/26/2015 Gender: M Age: 9 Height: 170.2 cm Weight: 90.7 kg BSA: 2.1 m^2 Pt. Status: Room:  SONOGRAPHER [place], Will Dwyane Luo, Mickle Mallory ATTENDING  Ena Dawley, M.D. PERFORMING [facility], Outpatient  cc:  ------------------------------------------------------------------- LV EF: 60% - 65%  ------------------------------------------------------------------- Indications: (I35.0).  ------------------------------------------------------------------- History: PMH: Acquired from the patient and from the patient&'s chart. Atrial fibrillation. Coronary artery disease. Aortic stenosis.  ------------------------------------------------------------------- Study Conclusions  - Left ventricle: The cavity size was mildly dilated. There was  mild concentric hypertrophy. Systolic function was normal. The  estimated ejection fraction was in the range of 60% to 65%. Wall  motion was normal; there were no regional wall motion  abnormalities. Doppler parameters are consistent with abnormal  left ventricular relaxation (grade 1 diastolic dysfunction).  Doppler parameters are consistent with elevated ventricular  end-diastolic filling pressure. - Aortic valve: Trileaflet; severely thickened, severely calcified  leaflets. Valve mobility was restricted. There was moderate  stenosis. There was mild to moderate regurgitation. Peak gradient  (S): 72 mm Hg. Mean gradient (S): 40 mm Hg. - Aortic root: The aortic root was normal in size. - Mitral valve: Calcified annulus. - Left atrium: The atrium was mildly dilated. - Right ventricle: Systolic function was normal. - Tricuspid valve: There was trivial regurgitation. - Pulmonary arteries: The main pulmonary artery was normal-sized.  Systolic pressure was within the normal range. - Inferior vena cava: The vessel was normal in size.  Impressions:  - When compared to the prior study from 07/12/2013 aortic stenosis  is now severe.  ------------------------------------------------------------------- Labs, prior tests, procedures, and surgery: Permanent pacemaker  system implantation.  Coronary artery bypass grafting. Transthoracic echocardiography. M-mode, complete 2D, spectral Doppler, and color Doppler. Birthdate: Patient birthdate: 1934-10-02. Age: Patient is 81 yr old. Sex: Gender: male. BMI: 31.3 kg/m^2. Blood pressure: 128/78 Patient status: Outpatient.  Study date: Study date: 03/26/2015. Study time: 01:31 PM. Location: [facility]  -------------------------------------------------------------------  ------------------------------------------------------------------- Left ventricle: The cavity size was mildly dilated. There was mild concentric hypertrophy. Systolic function was normal. The estimated ejection fraction was in the range of 60% to 65%. Wall motion was normal; there were no regional wall motion abnormalities. Doppler parameters are consistent with abnormal left ventricular relaxation (grade 1 diastolic dysfunction). Doppler parameters are consistent with elevated ventricular end-diastolic filling pressure.  ------------------------------------------------------------------- Aortic valve: Trileaflet; severely thickened, severely calcified leaflets. Valve mobility was restricted. Doppler: There was moderate stenosis. There was mild to moderate regurgitation. VTI ratio of LVOT to aortic valve: 0.21. Valve area (VTI): 0.71 cm^2. Indexed valve area (VTI): 0.34 cm^2/m^2. Mean velocity ratio of LVOT to aortic valve: 0.21. Valve area (Vmean): 0.72 cm^2. Indexed valve area (Vmean): 0.34 cm^2/m^2. Peak gradient (S): 72 mm Hg. Mean gradient (S): 40 mm Hg.  ------------------------------------------------------------------- Aorta: Aortic root: The aortic root was normal in size.  ------------------------------------------------------------------- Mitral valve: Calcified annulus. Mobility was not restricted. Doppler: Transvalvular velocity was within the normal range. There was no evidence  for stenosis. There was no regurgitation. Peak gradient (D): 3 mm Hg.  ------------------------------------------------------------------- Left atrium: The atrium was mildly dilated.  ------------------------------------------------------------------- Right ventricle: The cavity size was normal. Wall thickness was normal. Pacer wire or catheter noted in right ventricle. Systolic function was normal.  ------------------------------------------------------------------- Pulmonic valve: Structurally normal valve. Cusp separation was normal. Doppler: Transvalvular velocity was within the normal range. There was no evidence for stenosis. There was no regurgitation.  ------------------------------------------------------------------- Tricuspid valve: Structurally normal valve. Doppler: Transvalvular velocity was within the normal range. There was trivial regurgitation.  ------------------------------------------------------------------- Pulmonary artery: The main pulmonary artery was normal-sized. Systolic pressure was within the normal range.  ------------------------------------------------------------------- Right atrium: The atrium was normal in size. Pacer wire or catheter noted in right atrium.  ------------------------------------------------------------------- Pericardium: There was no pericardial effusion.  ------------------------------------------------------------------- Systemic veins: Inferior vena cava: The vessel was normal in size.  ------------------------------------------------------------------- Measurements  Left ventricle Value Reference LV ID, ED, PLAX chordal (H) 56.8 mm 43 - 52 LV ID, ES, PLAX chordal (H) 38.4 mm 23 - 38 LV fx shortening, PLAX chordal 32 % >=29 LV PW thickness, ED 11.6 mm  --------- IVS/LV PW ratio, ED 1.02 <=1.3 Stroke volume, 2D 73 ml --------- Stroke volume/bsa, 2D 35 ml/m^2 --------- LV e&', lateral 7.51 cm/s --------- LV E/e&', lateral 11.5 --------- LV e&', medial 6.14 cm/s --------- LV E/e&', medial 14.07 --------- LV e&', average 6.83 cm/s --------- LV E/e&', average 12.66 ---------  Ventricular septum Value Reference IVS thickness, ED 11.8 mm ---------  LVOT Value Reference LVOT ID, S 21 mm --------- LVOT area 3.46 cm^2 --------- LVOT ID 21 mm --------- LVOT peak velocity, S 94.7 cm/s --------- LVOT mean velocity, S 59.2 cm/s --------- LVOT VTI, S 21.2 cm --------- Stroke volume (SV), LVOT DP 73.4 ml --------- Stroke index (SV/bsa), LVOT DP 35 ml/m^2 ---------  Aortic valve Value Reference Aortic valve mean velocity, S 286 cm/s --------- Aortic valve VTI, S 103 cm --------- Aortic mean gradient, S 38 mm Hg --------- VTI ratio, LVOT/AV 0.21 --------- Aortic valve area, VTI 0.71 cm^2 --------- Aortic valve area/bsa, VTI 0.34 cm^2/m^2 --------- Velocity ratio, mean, LVOT/AV 0.21  --------- Aortic valve area, mean velocity 0.72 cm^2 --------- Aortic valve area/bsa, mean 0.34 cm^2/m^2 --------- velocity Aortic regurg pressure half-time 479 ms ---------  Aorta Value Reference Aortic root ID, ED 32 mm --------- Ascending aorta ID, A-P, S 33  mm ---------  Left atrium Value Reference LA ID, A-P, ES 43 mm --------- LA ID/bsa, A-P 2.05 cm/m^2 <=2.2 LA volume, S 72 ml --------- LA volume/bsa, S 34.3 ml/m^2 --------- LA volume, ES, 1-p A4C 60 ml --------- LA volume/bsa, ES, 1-p A4C 28.6 ml/m^2 --------- LA volume, ES, 1-p A2C 86 ml --------- LA volume/bsa, ES, 1-p A2C 41 ml/m^2 ---------  Mitral valve Value Reference Mitral E-wave peak velocity 86.4 cm/s --------- Mitral A-wave peak velocity 108 cm/s --------- Mitral deceleration time (H) 289 ms 150 - 230 Mitral peak gradient, D 3 mm Hg --------- Mitral E/A ratio, peak 0.8 ---------  Pulmonary arteries Value Reference PA pressure, S, DP 27 mm Hg <=30  Tricuspid valve Value Reference Tricuspid regurg peak velocity 247 cm/s --------- Tricuspid peak RV-RA gradient 24 mm Hg ---------  Systemic veins Value Reference Estimated CVP 3  mm Hg ---------  Right ventricle Value Reference RV pressure, S, DP 27 mm Hg <=30 RV s&', lateral, S 8.68 cm/s ---------  Legend: (L) and (H) mark values outside specified reference range.  ------------------------------------------------------------------- Prepared and Electronically Authenticated by  Ena Dawley, M.D. 2017-01-09T18:11:59  Sherren Mocha, MD (Primary)      Procedures    Right/Left Heart Cath and Coronary/Graft Angiography    Conclusion     Ost LM lesion, 40% stenosed.  Ost Cx to Prox Cx lesion, 90% stenosed.  Ost LAD to Prox LAD lesion, 75% stenosed.  Ost Ramus lesion, 99% stenosed.  Mid LAD lesion, 50% stenosed.  Prox RCA lesion, 50% stenosed.  Dist RCA lesion, 25% stenosed.  SVG .  Widely patent SVG-diagonal  SVG was injected is normal in caliber.  Widely patent sequential SVG-Ramus and OM2  SVG was injected .  Origin lesion, 100% stenosed.  1. Severe 3 vessel CAD with diffuse calcific LAD stenosis and severe proximal LCx stenosis 2. Calcified RCA with mild diffuse nonobstructive disease 3. S/P aortocoronary bypass with patent LIMA-LAD, SVG-diagonal, and sequential SVG-ramus and OM2. Chronic occlusion of the SVG-right PDA 4. Moderate-severe aortic stenosis with mean gradient 28 mmHg and calculated AVA 1.08 square cm  Continued evaluation for TAVR. Pt clearly has severe aortic stenosis by echo criteria and progressive clinical symptoms.     Indications    Severe aortic stenosis [I35.0 (ICD-10-CM)]    Technique and Indications    INDICATION: 81 yo male with prior CABG, now with NYHA 2 symptoms of progressive angina and dyspnea, found to have severe aortic stenosis by echo criteria  PROCEDURAL DETAILS: The right groin was prepped, draped, and anesthetized with 1% lidocaine. Using the modified  Seldinger technique a 5 French sheath was placed in the right femoral artery and a 7 French sheath was placed in the right femoral vein. A Swan-Ganz catheter was used for the right heart catheterization. Standard protocol was followed for recording of right heart pressures and sampling of oxygen saturations. Fick cardiac output was calculated. Standard Judkins catheters were used for selective coronary angiography and left ventriculography. There were no immediate procedural complications. The patient was transferred to the post catheterization recovery area for further monitoring.  During this procedure the patient is administered a total of Versed 1 mg and Fentanyl 25 mg to achieve and maintain moderate conscious sedation. [name] heart rate, blood pressure, and oxygen saturation are monitored continuously during the procedure. The period of conscious sedation is 21 minutes, of which I was present face-to-face 100% of this time. Estimated blood loss <50 mL. There were no immediate complications during the procedure.  Coronary Findings    Dominance: Right   Left Main   . Ost LM lesion, 40% stenosed. Calcified. Mild stenosis in the mid-portion of the left main     Left Anterior Descending   . Ost LAD to Prox LAD lesion, 75% stenosed. Calcified.   . Mid LAD lesion, 50% stenosed. Calcified diffuse.     Ramus Intermedius  . Vessel is small.   Colon Flattery Ramus lesion, 99% stenosed. Calcified.     Left Circumflex   . Ost Cx to Prox Cx lesion, 90% stenosed. Calcified eccentric.     Right Coronary Artery   . Prox RCA lesion, 50% stenosed. Calcified.   . Dist RCA lesion, 25% stenosed. Calcified.     Graft Angiography    Free Graft to 1st Diag  SVG Widely patent SVG-diagonal     Sequential single graft Graft to Ramus, 2nd Mrg  SVG was injected is normal in caliber. Widely patent sequential SVG-Ramus and OM2     Free Graft to  RPDA  SVG was injected .   [name] Origin lesion, 100% stenosed. Chronically occluded           Right Heart Pressures Hemodynamic findings consistent with aortic stenosis. LV EDP is normal.    Left Heart    Aortic Valve There is severe aortic valve stenosis. The aortic valve is calcified. Mean gradient 28 mmHg, peak-to-peak gradient 29 mmHg, AVA 1.08 square cm, AVA index 0.55    Coronary Diagrams    Diagnostic Diagram            Implants    No implant documentation for this case.    PACS Images    Show images for Cardiac catheterization     Link to Procedure Log    Procedure Log      Hemo Data       Most Recent Value   Fick Cardiac Output  5.32 L/min   Fick Cardiac Output Index  2.7 (L/min)/BSA   Aortic Mean Gradient  28 mmHg   Aortic Peak Gradient  29 mmHg   Aortic Valve Area  1.08   Aortic Value Area Index  0.55 cm2/BSA   RA A Wave  8 mmHg   RA V Wave  7 mmHg   RA Mean  6 mmHg   RV Systolic Pressure  31 mmHg   RV Diastolic Pressure  1 mmHg   RV EDP  8 mmHg   PA Systolic Pressure  28 mmHg   PA Diastolic Pressure  8 mmHg   PA Mean  17 mmHg   PW A Wave  11 mmHg   PW V Wave  6 mmHg   PW Mean  6 mmHg   AO Systolic Pressure  123456 mmHg   AO Diastolic Pressure  68 mmHg   AO Mean  94 mmHg   LV Systolic Pressure  0000000 mmHg   LV Diastolic Pressure  2 mmHg   LV EDP  13 mmHg   Arterial Occlusion Pressure Extended Systolic Pressure  123456 mmHg   Arterial Occlusion Pressure Extended Diastolic Pressure  64 mmHg   Arterial Occlusion Pressure Extended Mean Pressure  94 mmHg   Left Ventricular Apex Extended Systolic Pressure  0000000 mmHg   Left Ventricular Apex Extended Diastolic Pressure  1 mmHg   Left Ventricular Apex Extended EDP Pressure  12 mmHg   QP/QS  1    TPVR Index  6.29 HRUI   TSVR Index  34.77 HRUI   PVR SVR Ratio  0.13   TPVR/TSVR Ratio  0.18   ADDENDUM REPORT: 05/11/2015 14:05 CLINICAL DATA: Aortic stenosis EXAM: Cardiac TAVR CT TECHNIQUE: The patient was scanned on a Philips 256 scanner. A 120 kV retrospective scan was triggered in the descending thoracic aorta at 111 HU's. Gantry rotation speed was 270 msecs and collimation was .9 mm. No beta blockade or nitro were given. The 3D data set was reconstructed in 5% intervals of the R-R cycle. Systolic and diastolic phases were analyzed on a dedicated work station using MPR, MIP and VRT modes. The patient received 80 cc of contrast. FINDINGS: Aortic Valve: Trileaflet and heavily calcified. The annulus is very elliptical Aorta: Mild calcification of the arch Sinotubular Junction: 30 mm Ascending Thoracic Aorta: 33.7 mm Aortic Arch: 25 mm Descending Thoracic Aorta: 29 mm Sinus of Valsalva Measurements: Non-coronary: 33 mm Right -coronary: 31 mm Left -coronary: 32 mm Coronary Artery Height above Annulus: Left Main: 12.8 mm Right Coronary: 20 mm Virtual Basal Annulus Measurements: Maximum/Minimum Diameter: 31 mm x 22 mm Perimeter: 88 mm Area: 566 mm2 Coronary Arteries: Sufficient height above annulus for delivery Optimum Fluoroscopic Angle for Delivery: AP IMPRESSION: 1) Calcified trileaflet AV with annulus 566 mm2 suitable for a 29 mm Sapien 3 valve 2) Sufficient coronary height for delivery 3) Optimum angiographic angle for delivery is AP 4) RV enlargement with pacing wires 5) Mild calcification of the aortic arch Jenkins Rouge Electronically Signed  By: Jenkins Rouge M.D.  On: 05/11/2015 14:05     Study Result     EXAM: OVER-READ INTERPRETATION CT CHEST  The following report is an over-read performed by radiologist Dr. Laurence Ferrari [facility] on 05/11/2015. This over-read does not include  interpretation of cardiac or coronary anatomy or pathology. The coronary CTA interpretation by the cardiologist is attached.  COMPARISON: 04/27/2015 ; 01/09/2012  FINDINGS: Mediastinum/Nodes: Aortic arch and branch vessel atherosclerotic calcification noted with prior CABG. No pathologic adenopathy  Lungs/Pleura: Mild paraseptal emphysema. Calcified granuloma medially in the right lower lobe, image 58 series 412. 3 mm noncalcified nodule in the lingula, image 49 series 412. Linear subsegmental atelectasis or scarring in the lower lingula and in the left lower lobe along the hemidiaphragm. Mosaic attenuation at the lung bases probably from air trapping in the setting of bronchiolitis given the underlying airway thickening.  Upper abdomen: Hepatic morphology suspicious for early cirrhosis.  Musculoskeletal: Thoracic spondylosis.  IMPRESSION: 1. 3 mm noncalcified lingular nodule. I do not see that the vertical level of this nodule was included on prior abdomen CTs. If the patient is at high risk for bronchogenic carcinoma, follow-up chest CT at 1 year is recommended. If the patient is at low risk, no follow-up is needed. This recommendation follows the consensus statement: Guidelines for Management of Small Pulmonary Nodules Detected on CT Scans: A Statement from the Fleischner Society as published in Radiology 2005; 237:395-400. 2. Additional extracardiac findings include aortic arch and branch vessel atherosclerosis ; paraseptal emphysema; airway thickening suggesting bronchitis; mosaic attenuation in the lung bases favoring air trapping from bronchiolitis; and hepatic morphology suspicious for early cirrhosis.  Electronically Signed: By: Van Clines M.D. On: 05/11/2015 12:25    CLINICAL DATA: 81 year old male with history of severe aortic stenosis. Preprocedural study prior to potential transcatheter aortic valve replacement (TAVR).  EXAM: CT  ANGIOGRAPHY CHEST, ABDOMEN AND PELVIS  TECHNIQUE: Multidetector CT imaging through the chest, abdomen and pelvis was performed using the standard protocol during bolus administration of intravenous contrast. Multiplanar reconstructed images and MIPs were  obtained and reviewed to evaluate the vascular anatomy.  CONTRAST: 132mL OMNIPAQUE IOHEXOL 300 MG/ML SOLN  COMPARISON: Cardiac CT 05/11/2015. CT the abdomen pelvis 01/09/2012.  FINDINGS: CTA CHEST FINDINGS  Mediastinum/Lymph Nodes: Heart size is borderline enlarged. There is no significant pericardial fluid, thickening or pericardial calcification. There is atherosclerosis of the thoracic aorta, the great vessels of the mediastinum and the coronary arteries, including calcified atherosclerotic plaque in the left main, left anterior descending, left circumflex and right coronary arteries. Status post median sternotomy for CABG, including LIMA to the LAD. Severe thickening and calcification of the aortic valve. No pathologically enlarged mediastinal or hilar lymph nodes. Esophagus is unremarkable in appearance. No axillary lymphadenopathy. Right-sided pacemaker device in position with lead tips terminating in the right atrial appendage and right ventricular apex.  Lungs/Pleura: No acute consolidative airspace disease. No pleural effusions. No suspicious appearing pulmonary nodules or masses. Mild diffuse bronchial thickening. Mild paraseptal emphysema noted predominantly in the lung apices.  Musculoskeletal/Soft Tissues: Median sternotomy wires. There are no aggressive appearing lytic or blastic lesions noted in the visualized portions of the skeleton.  CTA ABDOMEN AND PELVIS FINDINGS  Hepatobiliary: The liver has a slightly shrunken appearance and nodular contour, suggestive of cirrhosis. No discrete cystic or solid hepatic lesions are identified. No intra or extrahepatic biliary ductal dilatation. Gallbladder is  normal in appearance.  Pancreas: The pancreatic mass. No pancreatic ductal dilatation. No pancreatic or peripancreatic fluid or inflammatory changes.  Spleen: Unremarkable.  Adrenals/Urinary Tract: Calcifications in the left renal hila are favored to be vascular, although the presence of nonobstructive calculi is not excluded. Sub cm low-attenuation lesion in the interpolar region of the left kidney is too small to definitively characterize, but is statistically likely a small cyst. Right kidney and bilateral adrenal glands are normal in appearance. No hydroureteronephrosis or perinephric stranding to indicate urinary tract obstruction at this time. The appearance of the urinary bladder is normal.  Stomach/Bowel: The appearance of the stomach is normal. No pathologic dilatation of small bowel or colon.  Vascular/Lymphatic: Extensive atherosclerosis throughout the abdominal and pelvic vasculature, including fusiform aneurysmal dilatation of the infrarenal abdominal aorta which measures up to 4.1 x 3.8 cm, shortly above the bifurcation. There is a large burden of atheromatous plaque and/or mural thrombus within the aneurysmally dilated segment of the infrarenal abdominal aorta. 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 Vascular findings and measurements pertinent to potential TAVR procedure, as detailed below. No lymphadenopathy noted in the abdomen or pelvis.  Reproductive: Prostate gland is either diminutive or surgically absent. Seminal vesicles are unremarkable in appearance.  Other: Trace volume of ascites in the low anatomic pelvis. No larger volume of ascites. No pneumoperitoneum.  Musculoskeletal: There are no aggressive appearing lytic or blastic lesions noted in the visualized portions of the skeleton.  VASCULAR MEASUREMENTS PERTINENT TO TAVR:  AORTA:  Minimal Aortic Diameter - 19 x 17 mm  Severity  of Aortic Calcification - moderate to severe  RIGHT PELVIS:  Right Common Iliac Artery -  Minimal Diameter - 7.9 x 8.3 mm  Tortuosity - mild  Calcification - moderate to severe  Right External Iliac Artery -  Minimal Diameter - 3.4 x 3.7 mm  Tortuosity - severe  Calcification - mild  Right Common Femoral Artery -  Minimal Diameter - 6.5 x 4.8 mm  Tortuosity - mild  Calcification - mild to moderate  LEFT PELVIS:  Left Common Iliac Artery -  Minimal  Diameter - 8.4 x 8.4 mm  Tortuosity - mild  Calcification - moderate-to-severe  Left External Iliac Artery -  Minimal Diameter - 5.7 x 5.0 mm  Tortuosity - severe  Calcification - mild  Left Common Femoral Artery -  Minimal Diameter - 7.6 x 4.9 mm  Tortuosity - mild  Calcification - moderate  Review of the MIP images confirms the above findings.  IMPRESSION: 1. Vascular findings and measurements pertinent to potential TAVR procedure, as detailed above. This patient does not appear to have suitable pelvic arterial access on either side (particularly on the right side) secondary to the small caliber of the vessels and extreme tortuosity of the external iliac segments. 2. Severe calcifications of the aortic valve, compatible with the reported clinical history of severe aortic stenosis. 3. Mild diffuse bronchial wall thickening with mild paraseptal emphysema; imaging findings suggestive of underlying COPD. 4. Atherosclerosis, including left main and 3 vessel coronary artery disease. Status post median sternotomy for CABG, including LIMA to the LAD. In addition, there is aneurysmal dilatation of the infrarenal abdominal aorta which measures up to 4.1 x 3.8 cm, and contains a large burden of atheromatous plaque and/or mural thrombus within the aneurysmal segment. 5. Morphologic changes in the liver compatible with underlying cirrhosis. 6. Additional incidental findings, as  above.   Electronically Signed  By: Vinnie Langton M.D.  On: 05/22/2015 15:02   Impression:  He has stage D severe symptomatic aortic stenosis with exertional fatigue, shortness of breath and chest pain. He is a little vague with his description of his symptoms but I think he probably has NYHA class 2 symptoms. His chest pain sounds non-exertional and intermittent and I don't think it is ischemic. I have personally reviewed his echo, cath and CTA studies. He clearly has progressive severe AS with a trileaflet, severely thickened and calcified valve with a mean gradient of 40 mm Hg. I think AVR is indicated for this active gentleman. I think TAVR is the best procedure for him at 81 years old with prior CABG with 4/5 patent grafts and no indication for redo CABG. His operative risk for open surgical AVR would be significant and higher than with TAVR. He has borderline pelvic access for a transfemoral approach although we may be able to get up the left side. If we can't get the dilator or sheath up the left iliac then we will need either transapical access or possibly left axillary artery access. He does have significant calcified plaque at the takeoff of the left subclavian artery from the aorta but I think the lumen may be adequate.   The patient and his wife were counseled at length regarding treatment alternatives for management of severe symptomatic aortic stenosis. Alternative approaches such as conventional aortic valve replacement, transcatheter aortic valve replacement, and palliative medical therapy were compared and contrasted at length. The risks associated with conventional surgical aortic valve replacement were been discussed in detail, as were expectations for post-operative convalescence. Long-term prognosis with medical therapy was discussed.   We discussed complications that might develop including but not limited to risks of death, stroke, paravalvular leak, aortic dissection  or other major vascular complications, aortic annulus rupture, device embolization, cardiac rupture or perforation, mitral regurgitation, acute myocardial infarction, arrhythmia, heart block or bradycardia requiring permanent pacemaker placement, congestive heart failure, respiratory failure, renal failure, pneumonia, infection, other late complications related to structural valve deterioration or migration, or other complications that might ultimately cause a temporary or permanent loss of functional independence or  other long term morbidity. The patient provides full informed consent for the procedure as described and all questions were answered. He would like to proceed with TAVR.    Plan:      Left transfemoral TAVR. If transfemoral access is inadequate then we will use trans-apical or left axillary artery approach.   Gaye Pollack, MD 06/11/2015

## 2015-06-11 NOTE — Anesthesia Preprocedure Evaluation (Signed)
Anesthesia Evaluation  Patient identified by MRN, date of birth, ID band Patient awake    Reviewed: Allergy & Precautions, H&P , NPO status , Patient's Chart, lab work & pertinent test results  History of Anesthesia Complications Negative for: history of anesthetic complications  Airway Mallampati: II  TM Distance: >3 FB Neck ROM: full    Dental no notable dental hx.    Pulmonary neg pulmonary ROS, sleep apnea and Continuous Positive Airway Pressure Ventilation , COPD, former smoker,    Pulmonary exam normal breath sounds clear to auscultation       Cardiovascular hypertension, Pt. on medications + CAD and + Peripheral Vascular Disease  Normal cardiovascular exam+ pacemaker + Valvular Problems/Murmurs AS  Rhythm:regular Rate:Normal     Neuro/Psych negative neurological ROS     GI/Hepatic Neg liver ROS, GERD  ,  Endo/Other  negative endocrine ROS  Renal/GU negative Renal ROS     Musculoskeletal   Abdominal   Peds  Hematology negative hematology ROS (+)   Anesthesia Other Findings   Reproductive/Obstetrics negative OB ROS                             Anesthesia Physical Anesthesia Plan  ASA: IV  Anesthesia Plan: General   Post-op Pain Management:    Induction: Intravenous  Airway Management Planned: Oral ETT  Additional Equipment: 3D TEE, PA Cath, Arterial line, TEE, CVP and Ultrasound Guidance Line Placement  Intra-op Plan:   Post-operative Plan: Possible Post-op intubation/ventilation  Informed Consent: I have reviewed the patients History and Physical, chart, labs and discussed the procedure including the risks, benefits and alternatives for the proposed anesthesia with the patient or authorized representative who has indicated his/her understanding and acceptance.   Dental Advisory Given  Plan Discussed with: Anesthesiologist, CRNA and Surgeon  Anesthesia Plan Comments:          Anesthesia Quick Evaluation

## 2015-06-12 ENCOUNTER — Inpatient Hospital Stay (HOSPITAL_COMMUNITY): Payer: Medicare Other

## 2015-06-12 ENCOUNTER — Encounter (HOSPITAL_COMMUNITY): Payer: Self-pay | Admitting: *Deleted

## 2015-06-12 ENCOUNTER — Encounter (HOSPITAL_COMMUNITY)
Admission: RE | Disposition: A | Discharge status: Home / Self Care | Payer: Self-pay | Source: Ambulatory Visit | Attending: Surgery

## 2015-06-12 ENCOUNTER — Inpatient Hospital Stay (HOSPITAL_COMMUNITY): Payer: Medicare Other | Admitting: Anesthesiology

## 2015-06-12 ENCOUNTER — Inpatient Hospital Stay (HOSPITAL_COMMUNITY)
Admission: RE | Admit: 2015-06-12 | Discharge: 2015-06-14 | DRG: 267 | Disposition: A | Discharge status: Home / Self Care | Payer: Medicare Other | Source: Ambulatory Visit | Attending: Surgery | Admitting: Surgery

## 2015-06-12 DIAGNOSIS — I350 Nonrheumatic aortic (valve) stenosis: Secondary | ICD-10-CM

## 2015-06-12 DIAGNOSIS — I440 Atrioventricular block, first degree: Secondary | ICD-10-CM | POA: Diagnosis not present

## 2015-06-12 DIAGNOSIS — Z006 Encounter for examination for normal comparison and control in clinical research program: Secondary | ICD-10-CM | POA: Diagnosis not present

## 2015-06-12 DIAGNOSIS — I480 Paroxysmal atrial fibrillation: Secondary | ICD-10-CM | POA: Diagnosis present

## 2015-06-12 DIAGNOSIS — G4733 Obstructive sleep apnea (adult) (pediatric): Secondary | ICD-10-CM | POA: Diagnosis present

## 2015-06-12 DIAGNOSIS — I739 Peripheral vascular disease, unspecified: Secondary | ICD-10-CM | POA: Diagnosis present

## 2015-06-12 DIAGNOSIS — H9190 Unspecified hearing loss, unspecified ear: Secondary | ICD-10-CM | POA: Diagnosis present

## 2015-06-12 DIAGNOSIS — Z8546 Personal history of malignant neoplasm of prostate: Secondary | ICD-10-CM | POA: Diagnosis not present

## 2015-06-12 DIAGNOSIS — I25810 Atherosclerosis of coronary artery bypass graft(s) without angina pectoris: Secondary | ICD-10-CM | POA: Diagnosis present

## 2015-06-12 DIAGNOSIS — J449 Chronic obstructive pulmonary disease, unspecified: Secondary | ICD-10-CM | POA: Diagnosis present

## 2015-06-12 DIAGNOSIS — D696 Thrombocytopenia, unspecified: Secondary | ICD-10-CM | POA: Diagnosis not present

## 2015-06-12 DIAGNOSIS — I708 Atherosclerosis of other arteries: Secondary | ICD-10-CM | POA: Diagnosis present

## 2015-06-12 DIAGNOSIS — I495 Sick sinus syndrome: Secondary | ICD-10-CM | POA: Diagnosis not present

## 2015-06-12 DIAGNOSIS — I10 Essential (primary) hypertension: Secondary | ICD-10-CM | POA: Diagnosis present

## 2015-06-12 DIAGNOSIS — K219 Gastro-esophageal reflux disease without esophagitis: Secondary | ICD-10-CM | POA: Diagnosis present

## 2015-06-12 DIAGNOSIS — Z9861 Coronary angioplasty status: Secondary | ICD-10-CM | POA: Diagnosis not present

## 2015-06-12 DIAGNOSIS — I472 Ventricular tachycardia: Secondary | ICD-10-CM | POA: Diagnosis not present

## 2015-06-12 DIAGNOSIS — I2510 Atherosclerotic heart disease of native coronary artery without angina pectoris: Secondary | ICD-10-CM | POA: Diagnosis present

## 2015-06-12 DIAGNOSIS — Z954 Presence of other heart-valve replacement: Secondary | ICD-10-CM | POA: Diagnosis not present

## 2015-06-12 DIAGNOSIS — Z952 Presence of prosthetic heart valve: Secondary | ICD-10-CM

## 2015-06-12 DIAGNOSIS — D62 Acute posthemorrhagic anemia: Secondary | ICD-10-CM | POA: Diagnosis not present

## 2015-06-12 DIAGNOSIS — I714 Abdominal aortic aneurysm, without rupture: Secondary | ICD-10-CM | POA: Diagnosis present

## 2015-06-12 DIAGNOSIS — I252 Old myocardial infarction: Secondary | ICD-10-CM | POA: Diagnosis not present

## 2015-06-12 DIAGNOSIS — E785 Hyperlipidemia, unspecified: Secondary | ICD-10-CM | POA: Diagnosis present

## 2015-06-12 DIAGNOSIS — I2584 Coronary atherosclerosis due to calcified coronary lesion: Secondary | ICD-10-CM | POA: Diagnosis present

## 2015-06-12 DIAGNOSIS — Z950 Presence of cardiac pacemaker: Secondary | ICD-10-CM | POA: Diagnosis not present

## 2015-06-12 HISTORY — PX: TEE WITHOUT CARDIOVERSION: SHX5443

## 2015-06-12 HISTORY — PX: TRANSCATHETER AORTIC VALVE REPLACEMENT, TRANSFEMORAL: SHX6400

## 2015-06-12 LAB — POCT I-STAT 3, VENOUS BLOOD GAS (G3P V)
Acid-Base Excess: 1 mmol/L (ref 0.0–2.0)
BICARBONATE: 26.9 meq/L — AB (ref 20.0–24.0)
O2 Saturation: 98 %
PCO2 VEN: 46 mmHg (ref 45.0–50.0)
TCO2: 28 mmol/L (ref 0–100)
pH, Ven: 7.367 — ABNORMAL HIGH (ref 7.250–7.300)
pO2, Ven: 103 mmHg — ABNORMAL HIGH (ref 31.0–45.0)

## 2015-06-12 LAB — POCT I-STAT 4, (NA,K, GLUC, HGB,HCT)
GLUCOSE: 109 mg/dL — AB (ref 65–99)
HEMATOCRIT: 39 % (ref 39.0–52.0)
Hemoglobin: 13.3 g/dL (ref 13.0–17.0)
POTASSIUM: 3.4 mmol/L — AB (ref 3.5–5.1)
Sodium: 142 mmol/L (ref 135–145)

## 2015-06-12 LAB — CBC
HCT: 31.2 % — ABNORMAL LOW (ref 39.0–52.0)
HCT: 33 % — ABNORMAL LOW (ref 39.0–52.0)
HEMOGLOBIN: 10.4 g/dL — AB (ref 13.0–17.0)
Hemoglobin: 10.9 g/dL — ABNORMAL LOW (ref 13.0–17.0)
MCH: 30.6 pg (ref 26.0–34.0)
MCH: 30.5 pg (ref 26.0–34.0)
MCHC: 33.3 g/dL (ref 30.0–36.0)
MCHC: 33 g/dL (ref 30.0–36.0)
MCV: 91.8 fL (ref 78.0–100.0)
MCV: 92.4 fL (ref 78.0–100.0)
PLATELETS: 75 10*3/uL — AB (ref 150–400)
PLATELETS: 73 10*3/uL — AB (ref 150–400)
RBC: 3.4 MIL/uL — AB (ref 4.22–5.81)
RBC: 3.57 MIL/uL — AB (ref 4.22–5.81)
RDW: 13.2 % (ref 11.5–15.5)
RDW: 13.3 % (ref 11.5–15.5)
WBC: 3 10*3/uL — AB (ref 4.0–10.5)
WBC: 3.7 10*3/uL — AB (ref 4.0–10.5)

## 2015-06-12 LAB — POCT I-STAT, CHEM 8
BUN: 15 mg/dL (ref 6–20)
BUN: 11 mg/dL (ref 6–20)
CALCIUM ION: 1.2 mmol/L (ref 1.13–1.30)
CHLORIDE: 102 mmol/L (ref 101–111)
CREATININE: 0.8 mg/dL (ref 0.61–1.24)
Calcium, Ion: 1.19 mmol/L (ref 1.13–1.30)
Chloride: 105 mmol/L (ref 101–111)
Creatinine, Ser: 0.8 mg/dL (ref 0.61–1.24)
GLUCOSE: 124 mg/dL — AB (ref 65–99)
Glucose, Bld: 85 mg/dL (ref 65–99)
HCT: 36 % — ABNORMAL LOW (ref 39.0–52.0)
HEMATOCRIT: 33 % — AB (ref 39.0–52.0)
Hemoglobin: 12.2 g/dL — ABNORMAL LOW (ref 13.0–17.0)
Hemoglobin: 11.2 g/dL — ABNORMAL LOW (ref 13.0–17.0)
Potassium: 3.3 mmol/L — ABNORMAL LOW (ref 3.5–5.1)
Potassium: 3.8 mmol/L (ref 3.5–5.1)
SODIUM: 141 mmol/L (ref 135–145)
SODIUM: 142 mmol/L (ref 135–145)
TCO2: 27 mmol/L (ref 0–100)
TCO2: 25 mmol/L (ref 0–100)

## 2015-06-12 LAB — PROTIME-INR
INR: 1.33 (ref 0.00–1.49)
Prothrombin Time: 16.6 seconds — ABNORMAL HIGH (ref 11.6–15.2)

## 2015-06-12 LAB — PREPARE RBC (CROSSMATCH)

## 2015-06-12 LAB — CREATININE, SERUM
Creatinine, Ser: 0.86 mg/dL (ref 0.61–1.24)
GFR calc non Af Amer: >60 mL/min (ref >60)

## 2015-06-12 LAB — GLUCOSE, CAPILLARY
GLUCOSE-CAPILLARY: 113 mg/dL — AB (ref 65–99)
Glucose-Capillary: 95 mg/dL (ref 65–99)
Glucose-Capillary: 80 mg/dL (ref 65–99)
Glucose-Capillary: 103 mg/dL — ABNORMAL HIGH (ref 65–99)

## 2015-06-12 LAB — MAGNESIUM: MAGNESIUM: 2.6 mg/dL — AB (ref 1.7–2.4)

## 2015-06-12 LAB — APTT: aPTT: 36 seconds (ref 24–37)

## 2015-06-12 SURGERY — IMPLANTATION, AORTIC VALVE, TRANSCATHETER, FEMORAL APPROACH
Anesthesia: General | Site: Chest

## 2015-06-12 MED ORDER — ACETAMINOPHEN 650 MG RE SUPP: 650.0000 mg | Freq: Once | RECTAL | Status: DC

## 2015-06-12 MED ORDER — CHLORHEXIDINE GLUCONATE 0.12 % MT SOLN
15.0000 mL | OROMUCOSAL | Status: AC
  Administered 2015-06-12: 15 mL via OROMUCOSAL
  Filled 2015-06-12: qty 15

## 2015-06-12 MED ORDER — FENTANYL CITRATE (PF) 250 MCG/5ML IJ SOLN
INTRAMUSCULAR | Status: AC
  Filled 2015-06-12: qty 5

## 2015-06-12 MED ORDER — ACETAMINOPHEN 160 MG/5ML PO SOLN: 1000.0000 mg | Freq: Four times a day (QID) | ORAL | Status: DC

## 2015-06-12 MED ORDER — SODIUM CHLORIDE 0.9 % IV SOLN: Freq: Once | INTRAVENOUS | Status: DC

## 2015-06-12 MED ORDER — ROCURONIUM BROMIDE 100 MG/10ML IV SOLN
INTRAVENOUS | Status: DC | PRN
  Administered 2015-06-12: 70 mg via INTRAVENOUS

## 2015-06-12 MED ORDER — ASPIRIN EC 325 MG PO TBEC
325.0000 mg | DELAYED_RELEASE_TABLET | Freq: Every day | ORAL | Status: DC
  Administered 2015-06-13: 325 mg via ORAL
  Filled 2015-06-12: qty 1

## 2015-06-12 MED ORDER — CHLORHEXIDINE GLUCONATE 4 % EX LIQD: 30.0000 mL | CUTANEOUS | Status: DC

## 2015-06-12 MED ORDER — ASPIRIN 81 MG PO CHEW: 324.0000 mg | CHEWABLE_TABLET | Freq: Every day | ORAL | Status: DC

## 2015-06-12 MED ORDER — ACETAMINOPHEN 500 MG PO TABS
1000.0000 mg | ORAL_TABLET | Freq: Four times a day (QID) | ORAL | Status: DC
  Filled 2015-06-12: qty 2

## 2015-06-12 MED ORDER — NOREPINEPHRINE BITARTRATE 1 MG/ML IV SOLN
4000.0000 ug | INTRAVENOUS | Status: DC | PRN
  Administered 2015-06-12: 3 ug/min via INTRAVENOUS

## 2015-06-12 MED ORDER — PROPOFOL 10 MG/ML IV BOLUS
INTRAVENOUS | Status: DC | PRN
  Administered 2015-06-12 (×2): 30 mg via INTRAVENOUS

## 2015-06-12 MED ORDER — ROCURONIUM BROMIDE 50 MG/5ML IV SOLN
INTRAVENOUS | Status: AC
  Filled 2015-06-12: qty 2

## 2015-06-12 MED ORDER — VANCOMYCIN HCL IN DEXTROSE 1-5 GM/200ML-% IV SOLN
1000.0000 mg | Freq: Once | INTRAVENOUS | Status: AC
  Administered 2015-06-12: 1000 mg via INTRAVENOUS
  Filled 2015-06-12: qty 200

## 2015-06-12 MED ORDER — ALBUMIN HUMAN 5 % IV SOLN
250.0000 mL | INTRAVENOUS | Status: DC | PRN
  Filled 2015-06-12: qty 250

## 2015-06-12 MED ORDER — SODIUM CHLORIDE 0.9 % IV SOLN
1.0000 mL/kg/h | INTRAVENOUS | Status: DC
  Administered 2015-06-12: 1 mL/kg/h via INTRAVENOUS

## 2015-06-12 MED ORDER — ALBUTEROL SULFATE (2.5 MG/3ML) 0.083% IN NEBU: 2.5000 mg | INHALATION_SOLUTION | RESPIRATORY_TRACT | Status: DC | PRN

## 2015-06-12 MED ORDER — SODIUM CHLORIDE 0.9 % IJ SOLN
INTRAMUSCULAR | Status: AC
  Filled 2015-06-12: qty 10

## 2015-06-12 MED ORDER — IODIXANOL 320 MG/ML IV SOLN
INTRAVENOUS | Status: DC | PRN
  Administered 2015-06-12: 58.8 mL via INTRA_ARTERIAL

## 2015-06-12 MED ORDER — MUPIROCIN 2 % EX OINT: 1.0000 "application " | TOPICAL_OINTMENT | Freq: Two times a day (BID) | CUTANEOUS | Status: DC

## 2015-06-12 MED ORDER — EPHEDRINE SULFATE 50 MG/ML IJ SOLN
INTRAMUSCULAR | Status: AC
  Filled 2015-06-12: qty 1

## 2015-06-12 MED ORDER — OXYCODONE HCL 5 MG PO TABS
5.0000 mg | ORAL_TABLET | ORAL | Status: DC | PRN
  Administered 2015-06-13: 10 mg via ORAL
  Filled 2015-06-12: qty 2

## 2015-06-12 MED ORDER — PROTAMINE SULFATE 10 MG/ML IV SOLN
INTRAVENOUS | Status: DC | PRN
  Administered 2015-06-12: 110 mg via INTRAVENOUS

## 2015-06-12 MED ORDER — MAGNESIUM SULFATE 4 GM/100ML IV SOLN
4.0000 g | Freq: Once | INTRAVENOUS | Status: AC
  Administered 2015-06-12: 4 g via INTRAVENOUS
  Filled 2015-06-12: qty 100

## 2015-06-12 MED ORDER — INSULIN ASPART 100 UNIT/ML ~~LOC~~ SOLN: 0.0000 [IU] | SUBCUTANEOUS | Status: DC

## 2015-06-12 MED ORDER — ONDANSETRON HCL 4 MG/2ML IJ SOLN: 4.0000 mg | Freq: Four times a day (QID) | INTRAMUSCULAR | Status: DC | PRN

## 2015-06-12 MED ORDER — FAMOTIDINE IN NACL 20-0.9 MG/50ML-% IV SOLN: 20.0000 mg | Freq: Two times a day (BID) | INTRAVENOUS | Status: DC

## 2015-06-12 MED ORDER — PHENYLEPHRINE 40 MCG/ML (10ML) SYRINGE FOR IV PUSH (FOR BLOOD PRESSURE SUPPORT)
PREFILLED_SYRINGE | INTRAVENOUS | Status: AC
  Filled 2015-06-12: qty 10

## 2015-06-12 MED ORDER — TRAMADOL HCL 50 MG PO TABS: 50.0000 mg | ORAL_TABLET | ORAL | Status: DC | PRN

## 2015-06-12 MED ORDER — HEPARIN SODIUM (PORCINE) 1000 UNIT/ML IJ SOLN
INTRAMUSCULAR | Status: DC | PRN
  Administered 2015-06-12: 9000 [IU] via INTRAVENOUS

## 2015-06-12 MED ORDER — INSULIN REGULAR HUMAN 100 UNIT/ML IJ SOLN
INTRAMUSCULAR | Status: DC
  Filled 2015-06-12: qty 2.5

## 2015-06-12 MED ORDER — ONDANSETRON HCL 4 MG/2ML IJ SOLN
INTRAMUSCULAR | Status: DC | PRN
  Administered 2015-06-12: 4 mg via INTRAVENOUS

## 2015-06-12 MED ORDER — FENTANYL CITRATE (PF) 100 MCG/2ML IJ SOLN
INTRAMUSCULAR | Status: DC | PRN
  Administered 2015-06-12 (×2): 50 ug via INTRAVENOUS

## 2015-06-12 MED ORDER — ACETAMINOPHEN 160 MG/5ML PO SOLN: 650.0000 mg | Freq: Once | ORAL | Status: DC

## 2015-06-12 MED ORDER — ATORVASTATIN CALCIUM 40 MG PO TABS
40.0000 mg | ORAL_TABLET | Freq: Every day | ORAL | Status: DC
  Administered 2015-06-12 – 2015-06-13 (×2): 40 mg via ORAL
  Filled 2015-06-12 (×3): qty 1

## 2015-06-12 MED ORDER — SUGAMMADEX SODIUM 200 MG/2ML IV SOLN
INTRAVENOUS | Status: AC
  Filled 2015-06-12: qty 2

## 2015-06-12 MED ORDER — METOPROLOL TARTRATE 12.5 MG HALF TABLET
12.5000 mg | ORAL_TABLET | Freq: Two times a day (BID) | ORAL | Status: DC
Start: 2015-06-12 — End: 2015-06-14
  Administered 2015-06-12 – 2015-06-14 (×5): 12.5 mg via ORAL
  Filled 2015-06-12 (×5): qty 1

## 2015-06-12 MED ORDER — SODIUM CHLORIDE 0.9 % IV SOLN: INTRAVENOUS | Status: DC

## 2015-06-12 MED ORDER — DEXTROSE 5 % IV SOLN
1.5000 g | Freq: Two times a day (BID) | INTRAVENOUS | Status: AC
  Administered 2015-06-12 – 2015-06-14 (×4): 1.5 g via INTRAVENOUS
  Filled 2015-06-12 (×5): qty 1.5

## 2015-06-12 MED ORDER — ALBUMIN HUMAN 5 % IV SOLN
INTRAVENOUS | Status: DC | PRN
  Administered 2015-06-12: 08:00:00 via INTRAVENOUS

## 2015-06-12 MED ORDER — 0.9 % SODIUM CHLORIDE (POUR BTL) OPTIME
TOPICAL | Status: DC | PRN
  Administered 2015-06-12: 5000 mL

## 2015-06-12 MED ORDER — SUCCINYLCHOLINE CHLORIDE 20 MG/ML IJ SOLN
INTRAMUSCULAR | Status: AC
  Filled 2015-06-12: qty 1

## 2015-06-12 MED ORDER — DEXMEDETOMIDINE HCL IN NACL 400 MCG/100ML IV SOLN
INTRAVENOUS | Status: DC | PRN
  Administered 2015-06-12: .3 ug/kg/h via INTRAVENOUS

## 2015-06-12 MED ORDER — REMIFENTANIL HCL 1 MG IV SOLR
INTRAVENOUS | Status: DC | PRN
  Administered 2015-06-12: .5 ug/kg/min via INTRAVENOUS

## 2015-06-12 MED ORDER — MUPIROCIN 2 % EX OINT
TOPICAL_OINTMENT | Freq: Once | CUTANEOUS | Status: AC
  Administered 2015-06-12: 1 via NASAL
  Filled 2015-06-12: qty 22

## 2015-06-12 MED ORDER — LACTATED RINGERS IV SOLN
INTRAVENOUS | Status: DC | PRN
  Administered 2015-06-12: 07:00:00 via INTRAVENOUS

## 2015-06-12 MED ORDER — DEXTROSE 5 % IV SOLN
0.0000 ug/min | INTRAVENOUS | Status: DC
  Filled 2015-06-12: qty 2

## 2015-06-12 MED ORDER — INSULIN ASPART 100 UNIT/ML ~~LOC~~ SOLN
0.0000 [IU] | Freq: Three times a day (TID) | SUBCUTANEOUS | Status: DC
  Administered 2015-06-13: 3 [IU] via SUBCUTANEOUS

## 2015-06-12 MED ORDER — CHLORHEXIDINE GLUCONATE 4 % EX LIQD
60.0000 mL | Freq: Once | CUTANEOUS | Status: DC
Start: 2015-06-12 — End: 2015-06-12

## 2015-06-12 MED ORDER — NITROGLYCERIN IN D5W 200-5 MCG/ML-% IV SOLN: 0.0000 ug/min | INTRAVENOUS | Status: DC

## 2015-06-12 MED ORDER — PANTOPRAZOLE SODIUM 40 MG PO TBEC
40.0000 mg | DELAYED_RELEASE_TABLET | Freq: Every day | ORAL | Status: DC
  Administered 2015-06-14: 40 mg via ORAL
  Filled 2015-06-12: qty 1

## 2015-06-12 MED ORDER — LIDOCAINE HCL (CARDIAC) 20 MG/ML IV SOLN
INTRAVENOUS | Status: DC | PRN
  Administered 2015-06-12: 60 mg via INTRAVENOUS
  Administered 2015-06-12 (×2): 20 mg via INTRAVENOUS

## 2015-06-12 MED ORDER — ACETAMINOPHEN 500 MG PO TABS
1000.0000 mg | ORAL_TABLET | Freq: Four times a day (QID) | ORAL | Status: DC
  Administered 2015-06-12 – 2015-06-13 (×3): 1000 mg via ORAL
  Filled 2015-06-12 (×2): qty 2

## 2015-06-12 MED ORDER — LACTATED RINGERS IV SOLN: 500.0000 mL | Freq: Once | INTRAVENOUS | Status: DC | PRN

## 2015-06-12 MED ORDER — INSULIN REGULAR BOLUS VIA INFUSION
0.0000 [IU] | Freq: Three times a day (TID) | INTRAVENOUS | Status: DC
  Filled 2015-06-12: qty 10

## 2015-06-12 MED ORDER — CLOPIDOGREL BISULFATE 75 MG PO TABS
75.0000 mg | ORAL_TABLET | Freq: Every day | ORAL | Status: DC
  Administered 2015-06-13 – 2015-06-14 (×2): 75 mg via ORAL
  Filled 2015-06-12 (×2): qty 1

## 2015-06-12 MED ORDER — LACTATED RINGERS IV SOLN
INTRAVENOUS | Status: DC | PRN
  Administered 2015-06-12 (×2): via INTRAVENOUS

## 2015-06-12 MED ORDER — METOPROLOL TARTRATE 1 MG/ML IV SOLN: 2.5000 mg | INTRAVENOUS | Status: DC | PRN

## 2015-06-12 MED ORDER — PHENYLEPHRINE HCL 10 MG/ML IJ SOLN
INTRAMUSCULAR | Status: DC | PRN
  Administered 2015-06-12: 40 ug via INTRAVENOUS
  Administered 2015-06-12 (×3): 80 ug via INTRAVENOUS
  Administered 2015-06-12: 40 ug via INTRAVENOUS

## 2015-06-12 MED ORDER — POTASSIUM CHLORIDE 10 MEQ/50ML IV SOLN
10.0000 meq | INTRAVENOUS | Status: AC
  Administered 2015-06-12 (×3): 10 meq via INTRAVENOUS
  Filled 2015-06-12 (×2): qty 50

## 2015-06-12 MED ORDER — SUGAMMADEX SODIUM 200 MG/2ML IV SOLN
INTRAVENOUS | Status: DC | PRN
  Administered 2015-06-12: 200 mg via INTRAVENOUS

## 2015-06-12 MED ORDER — MUPIROCIN 2 % EX OINT
TOPICAL_OINTMENT | CUTANEOUS | Status: AC
  Administered 2015-06-12: 1
  Filled 2015-06-12: qty 22

## 2015-06-12 MED ORDER — CHLORHEXIDINE GLUCONATE CLOTH 2 % EX PADS
6.0000 | MEDICATED_PAD | Freq: Every day | CUTANEOUS | Status: DC
  Administered 2015-06-12 – 2015-06-14 (×2): 6 via TOPICAL

## 2015-06-12 MED ORDER — PROPOFOL 10 MG/ML IV BOLUS
INTRAVENOUS | Status: AC
  Filled 2015-06-12: qty 20

## 2015-06-12 MED ORDER — CHLORHEXIDINE GLUCONATE 4 % EX LIQD: 60.0000 mL | Freq: Once | CUTANEOUS | Status: DC

## 2015-06-12 MED ORDER — MORPHINE SULFATE (PF) 2 MG/ML IV SOLN: 2.0000 mg | INTRAVENOUS | Status: DC | PRN

## 2015-06-12 SURGICAL SUPPLY — 108 items
ADAPTER UNIV SWAN GANZ BIP (ADAPTER) IMPLANT
ADAPTER UNV SWAN GANZ BIP (ADAPTER) ×2
ADH SKN CLS APL DERMABOND .7 (GAUZE/BANDAGES/DRESSINGS) ×2
ADPR CATH UNV NS SG CATH (ADAPTER) ×2
ATTRACTOMAT 16X20 MAGNETIC DRP (DRAPES) IMPLANT
BAG BANDED W/RUBBER/TAPE 36X54 (MISCELLANEOUS) ×4 IMPLANT
BAG DECANTER FOR FLEXI CONT (MISCELLANEOUS) IMPLANT
BAG EQP BAND 135X91 W/RBR TAPE (MISCELLANEOUS) ×2
BAG SNAP BAND KOVER 36X36 (MISCELLANEOUS) ×8 IMPLANT
BLADE 10 SAFETY STRL DISP (BLADE) ×4 IMPLANT
BLADE STERNUM SYSTEM 6 (BLADE) ×4 IMPLANT
BLADE SURG ROTATE 9660 (MISCELLANEOUS) IMPLANT
CABLE PACING FASLOC BIEGE (MISCELLANEOUS) IMPLANT
CABLE PACING FASLOC BLUE (MISCELLANEOUS) ×4 IMPLANT
CANISTER SUCTION 2500CC (MISCELLANEOUS) IMPLANT
CANNULA FEM VENOUS REMOTE 22FR (CANNULA) IMPLANT
CANNULA OPTISITE PERFUSION 16F (CANNULA) IMPLANT
CANNULA OPTISITE PERFUSION 18F (CANNULA) IMPLANT
CATH DIAG EXPO 6F AL2 (CATHETERS) ×3 IMPLANT
CATH DIAG EXPO 6F VENT PIG 145 (CATHETERS) ×6 IMPLANT
CATH EXPO 5FR FR4 (CATHETERS) ×3 IMPLANT
CATH S G BIP PACING (SET/KITS/TRAYS/PACK) ×6 IMPLANT
CLIP TI MEDIUM 24 (CLIP) ×4 IMPLANT
CLIP TI WIDE RED SMALL 24 (CLIP) ×4 IMPLANT
COVER DOME SNAP 22 D (MISCELLANEOUS) ×4 IMPLANT
COVER MAYO STAND STRL (DRAPES) ×4 IMPLANT
COVER TABLE BACK 60X90 (DRAPES) ×4 IMPLANT
CRADLE DONUT ADULT HEAD (MISCELLANEOUS) ×4 IMPLANT
DERMABOND ADVANCED (GAUZE/BANDAGES/DRESSINGS) ×2
DERMABOND ADVANCED .7 DNX12 (GAUZE/BANDAGES/DRESSINGS) ×2 IMPLANT
DEVICE CLOSURE PERCLS PRGLD 6F (VASCULAR PRODUCTS) ×2 IMPLANT
DRAPE INCISE IOBAN 66X45 STRL (DRAPES) IMPLANT
DRAPE SLUSH MACHINE 52X66 (DRAPES) ×4 IMPLANT
DRAPE TABLE COVER HEAVY DUTY (DRAPES) ×4 IMPLANT
DRSG TEGADERM 4X4.75 (GAUZE/BANDAGES/DRESSINGS) ×4 IMPLANT
ELECT REM PT RETURN 9FT ADLT (ELECTROSURGICAL) ×8
ELECTRODE REM PT RTRN 9FT ADLT (ELECTROSURGICAL) ×4 IMPLANT
FELT TEFLON 6X6 (MISCELLANEOUS) ×4 IMPLANT
FEMORAL VENOUS CANN RAP (CANNULA) IMPLANT
GAUZE SPONGE 4X4 12PLY STRL (GAUZE/BANDAGES/DRESSINGS) ×4 IMPLANT
GLOVE BIO SURGEON STRL SZ7.5 (GLOVE) ×4 IMPLANT
GLOVE BIO SURGEON STRL SZ8 (GLOVE) ×8 IMPLANT
GLOVE EUDERMIC 7 POWDERFREE (GLOVE) ×4 IMPLANT
GLOVE ORTHO TXT STRL SZ7.5 (GLOVE) ×4 IMPLANT
GOWN STRL REUS W/ TWL LRG LVL3 (GOWN DISPOSABLE) ×6 IMPLANT
GOWN STRL REUS W/ TWL XL LVL3 (GOWN DISPOSABLE) ×12 IMPLANT
GOWN STRL REUS W/TWL LRG LVL3 (GOWN DISPOSABLE) ×12
GOWN STRL REUS W/TWL XL LVL3 (GOWN DISPOSABLE) ×24
GUIDEWIRE SAF TJ AMPL .035X180 (WIRE) ×4 IMPLANT
GUIDEWIRE SAFE TJ AMPLATZ EXST (WIRE) ×3 IMPLANT
GUIDEWIRE STRAIGHT .035 260CM (WIRE) ×3 IMPLANT
GUIDEWIRE WHOLEY .035 145 JTIP (WIRE) ×2 IMPLANT
INSERT FOGARTY 61MM (MISCELLANEOUS) ×4 IMPLANT
INSERT FOGARTY SM (MISCELLANEOUS) ×8 IMPLANT
INSERT FOGARTY XLG (MISCELLANEOUS) IMPLANT
KIT BASIN OR (CUSTOM PROCEDURE TRAY) ×4 IMPLANT
KIT DILATOR VASC 18G NDL (KITS) IMPLANT
KIT HEART LEFT (KITS) ×2 IMPLANT
KIT ROOM TURNOVER OR (KITS) ×4 IMPLANT
KIT SUCTION CATH 14FR (SUCTIONS) ×8 IMPLANT
LIQUID BAND (GAUZE/BANDAGES/DRESSINGS) ×6 IMPLANT
NDL PERC 18GX7CM (NEEDLE) ×1 IMPLANT
NEEDLE PERC 18GX7CM (NEEDLE) ×8 IMPLANT
NS IRRIG 1000ML POUR BTL (IV SOLUTION) ×12 IMPLANT
PACK AORTA (CUSTOM PROCEDURE TRAY) ×4 IMPLANT
PAD ARMBOARD 7.5X6 YLW CONV (MISCELLANEOUS) ×8 IMPLANT
PAD ELECT DEFIB RADIOL ZOLL (MISCELLANEOUS) ×4 IMPLANT
PATCH TACHOSII LRG 9.5X4.8 (VASCULAR PRODUCTS) IMPLANT
PERCLOSE PROGLIDE 6F (VASCULAR PRODUCTS) ×8
SET MICROPUNCTURE 5F STIFF (MISCELLANEOUS) ×3 IMPLANT
SHEATH AVANTI 11CM 8FR (MISCELLANEOUS) ×2 IMPLANT
SHEATH PINNACLE 6F 10CM (SHEATH) ×4 IMPLANT
SLEEVE REPOSITIONING LENGTH 30 (MISCELLANEOUS) ×3 IMPLANT
SPONGE LAP 4X18 X RAY DECT (DISPOSABLE) ×4 IMPLANT
STOPCOCK 4 WAY LG BORE MALE ST (IV SETS) ×2 IMPLANT
STOPCOCK MORSE 400PSI 3WAY (MISCELLANEOUS) ×8 IMPLANT
SUT ETHIBOND X763 2 0 SH 1 (SUTURE) ×4 IMPLANT
SUT GORETEX CV 4 TH 22 36 (SUTURE) ×4 IMPLANT
SUT GORETEX CV4 TH-18 (SUTURE) ×12 IMPLANT
SUT GORETEX TH-18 36 INCH (SUTURE) ×8 IMPLANT
SUT MNCRL AB 3-0 PS2 18 (SUTURE) ×4 IMPLANT
SUT PROLENE 3 0 SH1 36 (SUTURE) IMPLANT
SUT PROLENE 4 0 RB 1 (SUTURE) ×4
SUT PROLENE 4-0 RB1 .5 CRCL 36 (SUTURE) ×2 IMPLANT
SUT PROLENE 5 0 C 1 36 (SUTURE) ×8 IMPLANT
SUT PROLENE 6 0 C 1 30 (SUTURE) ×8 IMPLANT
SUT SILK  1 MH (SUTURE) ×2
SUT SILK 1 MH (SUTURE) ×2 IMPLANT
SUT SILK 2 0 SH CR/8 (SUTURE) IMPLANT
SUT VIC AB 2-0 CT1 27 (SUTURE) ×4
SUT VIC AB 2-0 CT1 TAPERPNT 27 (SUTURE) ×2 IMPLANT
SUT VIC AB 2-0 CTX 36 (SUTURE) IMPLANT
SUT VIC AB 3-0 SH 8-18 (SUTURE) ×8 IMPLANT
SYR 30ML LL (SYRINGE) ×12 IMPLANT
SYR 50ML LL SCALE MARK (SYRINGE) ×7 IMPLANT
SYR MEDRAD MARK V 150ML (SYRINGE) ×2 IMPLANT
SYRINGE 10CC LL (SYRINGE) ×4 IMPLANT
TOWEL OR 17X26 10 PK STRL BLUE (TOWEL DISPOSABLE) ×8 IMPLANT
TRANSDUCER W/STOPCOCK (MISCELLANEOUS) ×2 IMPLANT
TRAY FOLEY IC TEMP SENS 14FR (CATHETERS) ×4 IMPLANT
TUBE SUCT INTRACARD DLP 20F (MISCELLANEOUS) IMPLANT
TUBING ART PRESS 72  MALE/FEM (TUBING) ×2
TUBING ART PRESS 72 MALE/FEM (TUBING) IMPLANT
TUBING HIGH PRESSURE 120CM (CONNECTOR) ×4 IMPLANT
VALVE HEART TRANSCATH SZ3 26MM (Prosthesis & Implant Heart) ×3 IMPLANT
WIRE AMPLATZ SS-J .035X180CM (WIRE) ×2 IMPLANT
WIRE BENTSON .035X145CM (WIRE) ×2 IMPLANT
WIRE J 3MM .035X145CM (WIRE) ×3 IMPLANT

## 2015-06-12 NOTE — Anesthesia Procedure Notes (Addendum)
Central Venous Catheter Insertion Performed by: anesthesiologist 06/12/2015 6:55 AM Patient location: Pre-op. Preanesthetic checklist: patient identified, IV checked, site marked, risks and benefits discussed, surgical consent, monitors and equipment checked, pre-op evaluation, timeout performed and anesthesia consent Position: Trendelenburg Lidocaine 1% used for infiltration Landmarks identified and Seldinger technique used Catheter size: 8.5 Fr Central line was placed.Sheath introducer Procedure performed using ultrasound guided technique. Attempts: 1 Following insertion, line sutured and dressing applied. Post procedure assessment: blood return through all ports, free fluid flow and no air. Patient tolerated the procedure well with no immediate complications.   Procedure Name: Intubation Date/Time: 06/12/2015 8:08 AM Performed by: Rebekah Chesterfield L Pre-anesthesia Checklist: Patient identified, Emergency Drugs available, Suction available and Patient being monitored Patient Re-evaluated:Patient Re-evaluated prior to inductionOxygen Delivery Method: Circle System Utilized Preoxygenation: Pre-oxygenation with 100% oxygen Intubation Type: IV induction Ventilation: Mask ventilation without difficulty and Oral airway inserted - appropriate to patient size Laryngoscope Size: Miller and 2 Grade View: Grade I Tube type: Oral Tube size: 7.5 mm Number of attempts: 1 Airway Equipment and Method: Stylet and Oral airway Placement Confirmation: ETT inserted through vocal cords under direct vision,  positive ETCO2 and breath sounds checked- equal and bilateral Secured at: 23 cm Tube secured with: Tape Dental Injury: Teeth and Oropharynx as per pre-operative assessment

## 2015-06-12 NOTE — Brief Op Note (Signed)
06/12/2015  10:24 AM  PATIENT:  [name]  81 y.o. male  PRE-OPERATIVE DIAGNOSIS:  SEVERE AORTIC STENOSIS  POST-OPERATIVE DIAGNOSIS:  SEVERE AORTIC STENOSIS  PROCEDURE:  Procedure(s): TRANSCATHETER AORTIC VALVE REPLACEMENT, TRANSFEMORAL,  TRANSESOPHAGEAL ECHOCARDIOGRAM (TEE) (N/A)  SURGEON:  Surgeon(s) and Role:    * Sherren Mocha, MD - Primary    * Gaye Pollack, MD - Co-Surgeon   PHYSICIAN ASSISTANT: none    ANESTHESIA:   general  EBL:  Total I/O In: 1100 [I.V.:850; IV Piggyback:250] Out: 400 [Urine:400]  BLOOD ADMINISTERED:none  DRAINS: none   LOCAL MEDICATIONS USED:  NONE  SPECIMEN:  No Specimen  DISPOSITION OF SPECIMEN:  N/A  COUNTS:  YES  TOURNIQUET:  * No tourniquets in log *  DICTATION: .Note written in EPIC  PLAN OF CARE: Admit to inpatient   PATIENT DISPOSITION:  ICU - extubated and stable.   Delay start of Pharmacological VTE agent (>24hrs) due to surgical blood loss or risk of bleeding: yes

## 2015-06-12 NOTE — Op Note (Signed)
[facility]   MULTIDISCIPLINARY HEART VALVE TEAM   TAVR OPERATIVE NOTE   Date of Procedure:  06/12/2015  Preoperative Diagnosis: Severe Aortic Stenosis   Postoperative Diagnosis: Same   Procedure:    Transcatheter Aortic Valve Replacement - Percutaneous Transfemoral Approach  Edwards Sapien 3 THV (size 29 mm, model # 9600TFX, serial # [id number])   Co-Surgeons:  Gilford Raid, MD and Sherren Mocha, MD  Anesthesiologist:  Jillyn Hidden, MD  Echocardiographer:  Ena Dawley, MD  Pre-operative Echo Findings:  Severe aortic stenosis  Normal left ventricular systolic function  Mild MR  Post-operative Echo Findings:  Trivial paravalvular leak  Normal left ventricular systolic function  No change in MR  BRIEF CLINICAL NOTE AND INDICATIONS FOR SURGERY  The patient is a 81 year old gentleman with a history of coronary artery disease s/p CABG x 5 by Dr Cyndia Bent in 1997, PAF, sinus bradycardia s/p PPM followed by Dr. [name], PVD, COPD and OSA, as well as aortic stenosis. For the past couple years he has noticed some exertional dyspnea, fatigue and chest discomfort. His symptoms have been progressing but he says he still feels well. His most recent echo in January showed an increase in the mean gradient to 40 mm Hg with a peak of 72 mm Hg and mild to moderate AI. The AVA was 0.7 cm2. LVEF was 60-65%. He underwent cardiac cath on 05/04/2015 showing severe 3 vessel CAD with a calcified RCA with mild diffuse nonobstructive disease. The vein graft to the PDA is chronically occluded. The other bypasses are patent. The mean AV gradient is 28 mm Hg with an AVA of 1.08 cm2. Gated cardiac CT showed a trileaflet AV with anatomy suitable for a 29 mm Sapient 3 valve. Abdominal/Pelvic CTA shows calcified vessels with a small AAA, probably suitable for transfemoral access on the left.   During the course of the patient's preoperative work up they have been evaluated comprehensively by a  multidisciplinary team of specialists coordinated through the [facility] in the [facility].  They have been demonstrated to suffer from symptomatic severe aortic stenosis as noted above. The patient has been counseled extensively as to the relative risks and benefits of all options for the treatment of severe aortic stenosis including long term medical therapy, conventional surgery for aortic valve replacement, and transcatheter aortic valve replacement.  The patient has been independently evaluated by two cardiac surgeons including Dr. Roxy Manns and Dr. Cyndia Bent, and they are felt to be at high risk for conventional surgical aortic valve replacement based upon a predicted risk of mortality using the Society of Thoracic Surgeons risk calculator of 8.4%.  Based upon review of all of the patient's preoperative diagnostic tests they are felt to be candidate for transcatheter aortic valve replacement using the transfemoral approach as an alternative to high risk conventional surgery.    Following the decision to proceed with transcatheter aortic valve replacement, a discussion has been held regarding what types of management strategies would be attempted intraoperatively in the event of life-threatening complications, including whether or not the patient would be considered a candidate for the use of cardiopulmonary bypass and/or conversion to open sternotomy for attempted surgical intervention.  The patient has been advised of a variety of complications that might develop peculiar to this approach including but not limited to risks of death, stroke, paravalvular leak, aortic dissection or other major vascular complications, aortic annulus rupture, device embolization, cardiac rupture or perforation, acute myocardial  infarction, arrhythmia, heart block or bradycardia requiring permanent pacemaker placement, congestive heart failure, respiratory failure, renal failure,  pneumonia, infection, other late complications related to structural valve deterioration or migration, or other complications that might ultimately cause a temporary or permanent loss of functional independence or other long term morbidity.  The patient provides full informed consent for the procedure as described and all questions were answered preoperatively.    DETAILS OF THE OPERATIVE PROCEDURE  PREPARATION:    The patient is brought to the operating room on the above mentioned date and central monitoring was established by the anesthesia team including placement of Swan-Ganz catheter and radial arterial line. The patient is placed in the supine position on the operating table.  Intravenous antibiotics are administered. General endotracheal anesthesia is induced uneventfully.  A Foley catheter is placed.  Baseline transesophageal echocardiogram was performed. [name] chest, abdomen, both groins, and both lower extremities are prepared and draped in a sterile manner. A time out procedure is performed.   PERIPHERAL ACCESS:    Using the modified Seldinger technique, femoral arterial and venous access was obtained with placement of 6 Fr sheaths on the right side.  A pigtail diagnostic catheter was passed through the right femoral arterial sheath under fluoroscopic guidance into the aortic root.  A temporary transvenous pacemaker catheter was passed through the right femoral venous sheath under fluoroscopic guidance into the right ventricle.  The pacemaker was tested to ensure stable lead placement and pacemaker capture. Aortic root angiography was performed in order to determine the optimal angiographic angle for valve deployment.   TRANSFEMORAL ACCESS:  A micropuncture technique is used to access the left femoral artery under fluoroscopic guidance.  2 Perclose devices are deployed at 10' and 2' positions to 'PreClose' the femoral artery. An 8 French sheath is placed and then an Amplatz  Superstiff wire is advanced through the sheath. This is changed out for a 16 French transfemoral E-Sheath after progressively dilating over the Superstiff wire.  An AL-2 catheter was used to direct a straight-tip exchange length wire across the native aortic valve into the left ventricle. This was exchanged out for a pigtail catheter and position was confirmed in the LV apex. Simultaneous LV and Ao pressures were recorded.  The pigtail catheter was exchanged for an Amplatz Extra-stiff wire in the LV apex.  Echocardiography was utilized to confirm appropriate wire position and no sign of entanglement in the mitral subvalvular apparatus.  TRANSCATHETER HEART VALVE DEPLOYMENT:  An Edwards Sapien 3 transcatheter heart valve (size 29 mm) was prepared and crimped per manufacturer's guidelines, and the proper orientation of the valve is confirmed on the [employer] delivery system. The valve was advanced through the introducer sheath using normal technique until in an appropriate position in the abdominal aorta beyond the sheath tip. The balloon was then retracted and using the fine-tuning wheel was centered on the valve. The valve was then advanced across the aortic arch using appropriate flexion of the catheter. The valve was carefully positioned across the aortic valve annulus. The Commander catheter was retracted using normal technique. Once final position of the valve has been confirmed by angiographic assessment, the valve is deployed while temporarily holding ventilation and during rapid ventricular pacing to maintain systolic blood pressure < 50 mmHg and pulse pressure < 10 mmHg. The balloon inflation is held for >3 seconds after reaching full deployment volume. Once the balloon has fully deflated the balloon is retracted into the ascending aorta and valve function is assessed  using echocardiography. There is felt to be trace paravalvular leak and no central aortic insufficiency.  [name] hemodynamic  recovery following valve deployment is good.  The deployment balloon and guidewire are both removed. Echo demostrated acceptable post-procedural gradients, stable mitral valve function, and trace aortic insufficiency.  PROCEDURE COMPLETION:  The sheath was removed and femoral artery closure is performed using the 2 previously deployed Perclose devices.   Protamine was administered once femoral arterial repair was complete. The temporary pacemaker, pigtail catheters and femoral sheaths were removed with manual pressure used for hemostasis.   The patient tolerated the procedure well and is transported to the surgical intensive care in stable condition. There were no immediate intraoperative complications. All sponge instrument and needle counts are verified correct at completion of the operation.   The patient received a total of 59 mL of intravenous contrast during the procedure.   Sherren Mocha, MD 06/12/2015 5:06 PM

## 2015-06-12 NOTE — Progress Notes (Signed)
RT set up patient's CPAP. Patient stated he would put it on when he is ready for bed. RT informed patient to call if he needed assistance. RT will continue to monitor as needed.

## 2015-06-12 NOTE — Progress Notes (Signed)
Spoke to [place] from [place], informed of surgery time, stated someone will be there at 0730.

## 2015-06-12 NOTE — Progress Notes (Signed)
  Echocardiogram Echocardiogram Transesophageal has been performed.  Bobbye Charleston 06/12/2015, 10:08 AM

## 2015-06-12 NOTE — Interval H&P Note (Signed)
History and Physical Interval Note:  06/12/2015 5:49 AM  [name]  has presented today for surgery, with the diagnosis of SEVERE AS  The various methods of treatment have been discussed with the patient and family. After consideration of risks, benefits and other options for treatment, the patient has consented to  Procedure(s): TRANSCATHETER AORTIC VALVE REPLACEMENT, TRANSFEMORAL, possible transpical (N/A) TRANSESOPHAGEAL ECHOCARDIOGRAM (TEE) (N/A) as a surgical intervention .  [name] history has been reviewed, patient examined, no change in status, stable for surgery.  I have reviewed the patient's chart and labs.  Questions were answered to the patient's satisfaction.     Gaye Pollack

## 2015-06-12 NOTE — Op Note (Signed)
[facility]   MULTIDISCIPLINARY HEART VALVE TEAM   TAVR OPERATIVE NOTE   Date of Procedure:  06/12/2015  Preoperative Diagnosis: Severe Aortic Stenosis   Postoperative Diagnosis: Same   Procedure:    Transcatheter Aortic Valve Replacement - Percutaneous Left Transfemoral Approach  Edwards Sapien 3 THV (size 29 mm, model # 9600TFX, serial # [id number])   Co-Surgeons:  Gaye Pollack, MD and Sherren Mocha, MD    Anesthesiologist:  Jillyn Hidden, MD  Echocardiographer:  Ena Dawley, MD  Pre-operative Echo Findings:   severe aortic stenosis   normal left ventricular systolic function   mild MR  Post-operative Echo Findings:  trivial paravalvular leak  normal left ventricular systolic function  Unchanged mild MR  BRIEF CLINICAL NOTE AND INDICATIONS FOR SURGERY   The patient is a 81 year old gentleman with a history of coronary artery disease s/p CABG x 5 by me in 1997, PAF, sinus bradycardia s/p PPM followed by Dr. [name], PVD, COPD and OSA, as well as aortic stenosis. For the past couple years he has noticed some exertional dyspnea, fatigue and chest discomfort. His symptoms have been progressing but he says he still feels well. His most recent echo in January showed an increase in the mean gradient to 40 mm Hg with a peak of 72 mm Hg and mild to moderate AI. The AVA was 0.7 cm2. LVEF was 60-65%. He was evaluated by Dr. Burt Knack and had a cath on 05/04/2015 showing severe 3 vessel CAD with a calcified RCA with mild diffuse nonobstructive disease. The vein graft to the PDA is chronically occluded. The other bypasses are patent. The mean AV gradient is 28 mm Hg with an AVA of 1.08 cm2. Gated cardiac CT showed a trileaflet AV with anatomy suitable for a 29 mm Sapient 3 valve. Abdominal and pelvic CT showed small caliber external iliac arteries. The left external iliac looked large enough to insert the 16 F sheath.  During the course of the patient's preoperative  work up they have been evaluated comprehensively by a multidisciplinary team of specialists coordinated through the [facility] in the [facility].  They have been demonstrated to suffer from symptomatic severe aortic stenosis as noted above. The patient has been counseled extensively as to the relative risks and benefits of all options for the treatment of severe aortic stenosis including long term medical therapy, conventional surgery for aortic valve replacement, and transcatheter aortic valve replacement.  The patient has been independently evaluated by two cardiac surgeons including myself and Dr. Roxy Manns, and they are felt to be at high risk for conventional surgical aortic valve replacement based upon a predicted risk of mortality using the Society of Thoracic Surgeons risk calculator of 8.4%.   Based upon review of all of the patient's preoperative diagnostic tests they are felt to be candidate for transcatheter aortic valve replacement using the transfemoral approach as an alternative to high risk conventional surgery.    Following the decision to proceed with transcatheter aortic valve replacement, a discussion has been held regarding what types of management strategies would be attempted intraoperatively in the event of life-threatening complications, including whether or not the patient would be considered a candidate for the use of cardiopulmonary bypass and/or conversion to open sternotomy for attempted surgical intervention.  The patient has been advised of a variety of complications that might develop peculiar to this approach including but not limited to risks of death, stroke, paravalvular leak, aortic  dissection or other major vascular complications, aortic annulus rupture, device embolization, cardiac rupture or perforation, acute myocardial infarction, arrhythmia, heart block or bradycardia requiring permanent pacemaker placement, congestive  heart failure, respiratory failure, renal failure, pneumonia, infection, other late complications related to structural valve deterioration or migration, or other complications that might ultimately cause a temporary or permanent loss of functional independence or other long term morbidity.  The patient provides full informed consent for the procedure as described and all questions were answered preoperatively.    DETAILS OF THE OPERATIVE PROCEDURE  PREPARATION:    The patient is brought to the operating room on the above mentioned date and central monitoring was established by the anesthesia team including placement of Swan-Ganz catheter and radial arterial line. The patient is placed in the supine position on the operating table.  Intravenous antibiotics are administered. General endotracheal anesthesia is induced uneventfully.  A Foley catheter is placed.  Baseline transesophageal echocardiogram was performed. [name] chest, abdomen, both groins, and both lower extremities are prepared and draped in a sterile manner. A time out procedure is performed.   Peripheral and transfemoral access was achieved by Dr. Burt Knack and will be dictated in his note.   BALLOON AORTIC VALVULOPLASTY:   Not performed   TRANSCATHETER HEART VALVE DEPLOYMENT:   An Edwards Sapien 3 transcatheter heart valve (size 29 mm, model #9600TFX, serial [id number]) was prepared and crimped per manufacturer's guidelines, and the proper orientation of the valve is confirmed on the [employer] delivery system. The valve was advanced through the introducer sheath using normal technique until in an appropriate position in the abdominal aorta beyond the sheath tip. The balloon was then retracted and using the fine-tuning wheel was centered on the valve. The valve was then advanced across the aortic arch using appropriate flexion of the catheter. The valve was carefully positioned across the aortic valve annulus. The  Commander catheter was retracted using normal technique. Once final position of the valve has been confirmed by angiographic assessment, the valve is deployed while temporarily holding ventilation and during rapid ventricular pacing to maintain systolic blood pressure < 50 mmHg and pulse pressure < 10 mmHg. The balloon inflation is held for >3 seconds after reaching full deployment volume. Once the balloon has fully deflated the balloon is retracted into the ascending aorta and valve function is assessed using echocardiography. There is felt to be trivial paravalvular leak and no central aortic insufficiency.  [name] hemodynamic recovery following valve deployment is good.  The deployment balloon and guidewire are both removed. Echo demostrated acceptable post-procedural gradients, stable mitral valve function, and trivial aortic insufficiency.   PROCEDURE COMPLETION:   The sheath was removed and femoral artery closure performed by Dr Burt Knack. Please see his separate report for details.  Protamine was administered once femoral arterial repair was complete. The temporary pacemaker, pigtail catheters and femoral sheaths were removed with manual pressure used for hemostasis.   The patient tolerated the procedure well and is transported to the surgical intensive care in stable condition. There were no immediate intraoperative complications. All sponge instrument and needle counts are verified correct at completion of the operation.   No blood products were administered during the operation.  The patient received a total of 59 mL of intravenous contrast during the procedure.   Gaye Pollack, MD 06/12/2015 9:20 PM

## 2015-06-12 NOTE — Anesthesia Postprocedure Evaluation (Signed)
Anesthesia Post Note  Patient: [name]  Procedure(s) Performed: Procedure(s) (LRB): TRANSCATHETER AORTIC VALVE REPLACEMENT, TRANSFEMORAL, possible transpical (N/A) TRANSESOPHAGEAL ECHOCARDIOGRAM (TEE) (N/A)  Patient location during evaluation: SICU Anesthesia Type: General Level of consciousness: awake and alert Pain management: pain level controlled Vital Signs Assessment: post-procedure vital signs reviewed and stable Respiratory status: spontaneous breathing, nonlabored ventilation, respiratory function stable and patient connected to nasal cannula oxygen Cardiovascular status: blood pressure returned to baseline and stable Postop Assessment: no signs of nausea or vomiting Anesthetic complications: no    Last Vitals:  Filed Vitals:   06/12/15 0554  BP: 153/75  Pulse: 64  Temp: 36.9 C  Resp: 20    Last Pain: There were no vitals filed for this visit.               Zenaida Deed

## 2015-06-12 NOTE — Progress Notes (Signed)
Patient ID: [name], male   DOB: 1934-12-27, 81 y.o.   MRN: [id number]   SICU Evening Rounds:   Hemodynamically stable   Awake, alert and neuro intact   Urine output good  Both groin sites hemostatic with no hematoma Feet warm and pink  CBC    Component Value Date/Time   WBC 3.7* 06/12/2015 1705   RBC 3.57* 06/12/2015 1705   HGB 10.9* 06/12/2015 1705   HCT 33.0* 06/12/2015 1705   PLT 73* 06/12/2015 1705   MCV 92.4 06/12/2015 1705   MCH 30.5 06/12/2015 1705   MCHC 33.0 06/12/2015 1705   RDW 13.3 06/12/2015 1705   LYMPHSABS 1.5 01/20/2013 1030   MONOABS 0.6 01/20/2013 1030   EOSABS 0.1 01/20/2013 1030   BASOSABS 0.0 01/20/2013 1030     BMET    Component Value Date/Time   NA 142 06/12/2015 1704   K 3.8 06/12/2015 1704   CL 105 06/12/2015 1704   CO2 21* 06/08/2015 1056   GLUCOSE 85 06/12/2015 1704   BUN 11 06/12/2015 1704   CREATININE 0.86 06/12/2015 1705   CREATININE 1.03 05/16/2015 1244   CALCIUM 9.5 06/08/2015 1056   GFRNONAA >60 06/12/2015 1705   GFRNONAA 69 05/16/2015 1244   GFRAA >60 06/12/2015 1705   GFRAA 79 05/16/2015 1244     A/P:  Stable postop course. Continue current plans

## 2015-06-12 NOTE — Transfer of Care (Signed)
Immediate Anesthesia Transfer of Care Note  Patient: [name]  Procedure(s) Performed: Procedure(s): TRANSCATHETER AORTIC VALVE REPLACEMENT, TRANSFEMORAL, possible transpical (N/A) TRANSESOPHAGEAL ECHOCARDIOGRAM (TEE) (N/A)  Patient Location: PACU  Anesthesia Type:General  Level of Consciousness: awake, alert , oriented and patient cooperative  Airway & Oxygen Therapy: Patient Spontanous Breathing and Patient connected to nasal cannula oxygen  Post-op Assessment: Report given to RN, Post -op Vital signs reviewed and stable and Patient moving all extremities  Post vital signs: Reviewed and stable  Last Vitals:  Filed Vitals:   06/12/15 0554  BP: 153/75  Pulse: 64  Temp: 36.9 C  Resp: 20    Complications: No apparent anesthesia complications

## 2015-06-13 ENCOUNTER — Inpatient Hospital Stay (HOSPITAL_COMMUNITY): Payer: Medicare Other

## 2015-06-13 ENCOUNTER — Telehealth: Payer: Self-pay | Admitting: Cardiology

## 2015-06-13 ENCOUNTER — Other Ambulatory Visit: Payer: Self-pay

## 2015-06-13 ENCOUNTER — Encounter (HOSPITAL_COMMUNITY): Payer: Self-pay | Admitting: Cardiovascular Disease

## 2015-06-13 ENCOUNTER — Ambulatory Visit (INDEPENDENT_AMBULATORY_CARE_PROVIDER_SITE_OTHER): Payer: Medicare Other | Admitting: *Deleted

## 2015-06-13 DIAGNOSIS — Z952 Presence of prosthetic heart valve: Secondary | ICD-10-CM

## 2015-06-13 DIAGNOSIS — Z954 Presence of other heart-valve replacement: Secondary | ICD-10-CM

## 2015-06-13 DIAGNOSIS — I350 Nonrheumatic aortic (valve) stenosis: Secondary | ICD-10-CM

## 2015-06-13 DIAGNOSIS — I495 Sick sinus syndrome: Secondary | ICD-10-CM | POA: Diagnosis not present

## 2015-06-13 LAB — BASIC METABOLIC PANEL
Anion gap: 11 (ref 5–15)
BUN: 11 mg/dL (ref 6–20)
CALCIUM: 8.4 mg/dL — AB (ref 8.9–10.3)
CHLORIDE: 109 mmol/L (ref 101–111)
CO2: 20 mmol/L — ABNORMAL LOW (ref 22–32)
CREATININE: 0.86 mg/dL (ref 0.61–1.24)
GFR calc non Af Amer: >60 mL/min (ref >60)
Glucose, Bld: 83 mg/dL (ref 65–99)
Potassium: 4.6 mmol/L (ref 3.5–5.1)
SODIUM: 140 mmol/L (ref 135–145)

## 2015-06-13 LAB — GLUCOSE, CAPILLARY
GLUCOSE-CAPILLARY: 89 mg/dL (ref 65–99)
GLUCOSE-CAPILLARY: 152 mg/dL — AB (ref 65–99)
Glucose-Capillary: 104 mg/dL — ABNORMAL HIGH (ref 65–99)
Glucose-Capillary: 102 mg/dL — ABNORMAL HIGH (ref 65–99)

## 2015-06-13 LAB — CBC
HCT: 35.2 % — ABNORMAL LOW (ref 39.0–52.0)
HEMOGLOBIN: 11.8 g/dL — AB (ref 13.0–17.0)
MCH: 31.3 pg (ref 26.0–34.0)
MCHC: 33.5 g/dL (ref 30.0–36.0)
MCV: 93.4 fL (ref 78.0–100.0)
Platelets: 59 10*3/uL — ABNORMAL LOW (ref 150–400)
RBC: 3.77 MIL/uL — ABNORMAL LOW (ref 4.22–5.81)
RDW: 13.3 % (ref 11.5–15.5)
WBC: 4.4 10*3/uL (ref 4.0–10.5)

## 2015-06-13 LAB — ECHOCARDIOGRAM COMPLETE
HEIGHTINCHES: 66 in
WEIGHTICAEL: 3142.88 [oz_av]

## 2015-06-13 LAB — MAGNESIUM: MAGNESIUM: 2.1 mg/dL (ref 1.7–2.4)

## 2015-06-13 MED FILL — Insulin Regular (Human) Inj 100 Unit/ML: INTRAMUSCULAR | Qty: 250 | Status: AC

## 2015-06-13 MED FILL — Potassium Chloride Inj 2 mEq/ML: INTRAVENOUS | Qty: 40 | Status: AC

## 2015-06-13 MED FILL — Magnesium Sulfate Inj 50%: INTRAMUSCULAR | Qty: 10 | Status: AC

## 2015-06-13 MED FILL — Heparin Sodium (Porcine) Inj 1000 Unit/ML: INTRAMUSCULAR | Qty: 30 | Status: AC

## 2015-06-13 NOTE — Progress Notes (Signed)
Echocardiogram 2D Echocardiogram has been performed.  Joelene Millin 06/13/2015, 2:54 PM

## 2015-06-13 NOTE — Telephone Encounter (Signed)
LMOVM reminding pt to send remote transmission.   

## 2015-06-13 NOTE — Progress Notes (Signed)
    Subjective:  Patient feeling well this morning. Sitting up in a chair. No chest pain or shortness of breath. He has already walked around the unit.  Objective:  Vital Signs in the last 24 hours: Temp:  [98.1 F (36.7 C)-99.6 F (37.6 C)] 99.6 F (37.6 C) (03/29 0741) Pulse Rate:  [51-75] 62 (03/29 0800) Resp:  [0-27] 17 (03/29 0800) BP: (110-159)/(47-99) 110/61 mmHg (03/29 0800) SpO2:  [91 %-100 %] 92 % (03/29 0800) Arterial Line BP: (116-170)/(51-66) 170/66 mmHg (03/28 1700) Weight:  [196 lb 6.9 oz (89.1 kg)] 196 lb 6.9 oz (89.1 kg) (03/29 0500)  Intake/Output from previous day: 03/28 0701 - 03/29 0700 In: 3020.1 [P.O.:480; I.V.:1890.1; IV Piggyback:650] Out: 2135 [Urine:2085; Blood:50]  Physical Exam: Pt is alert and oriented, NAD HEENT: normal Neck: JVP - normal Lungs: CTA bilaterally CV: RRR without murmur or gallop Abd: soft, NT, Positive BS, no hepatomegaly Ext: no C/C/E, distal pulses intact and equal, bilateral groin sites are clear Skin: warm/dry no rash   Lab Results:  Recent Labs  06/12/15 1705 06/13/15 0442  WBC 3.7* 4.4  HGB 10.9* 11.8*  PLT 73* 59*    Recent Labs  06/12/15 1704 06/12/15 1705 06/13/15 0442  NA 142  --  140  K 3.8  --  4.6  CL 105  --  109  CO2  --   --  20*  GLUCOSE 85  --  83  BUN 11  --  11  CREATININE 0.80 0.86 0.86   No results for input(s): TROPONINI in the last 72 hours.  Invalid input(s): CK, MB  Cardiac Studies: 2-D echocardiogram pending  Tele: Sinus rhythm/sinus bradycardia with occasional PVCs and intermittent demand pacing  Assessment/Plan:  1. Severe aortic stenosis status post TAVR postoperative day #1 2. CAD status post CABG, no ischemic symptoms 3. Sinus node dysfunction status post permanent pacemaker placement 4. Thrombocytopenia, postoperative, expected  The patient is progressing well. Will transfer to [place]. Start Plavix today. Follow lately count. Await 2-D echocardiogram. If he  continues to remain stable would anticipate discharge home tomorrow.  Sherren Mocha, M.D. 06/13/2015, 8:38 AM

## 2015-06-13 NOTE — Progress Notes (Signed)
1 Day Post-Op Procedure(s) (LRB): TRANSCATHETER AORTIC VALVE REPLACEMENT, TRANSFEMORAL, possible transpical (N/A) TRANSESOPHAGEAL ECHOCARDIOGRAM (TEE) (N/A) Subjective:  No complaints  Objective: Vital signs in last 24 hours: Temp:  [98.1 F (36.7 C)-99.6 F (37.6 C)] 99.6 F (37.6 C) (03/29 0741) Pulse Rate:  [51-75] 62 (03/29 0800) Cardiac Rhythm:  [-] Heart block (03/29 0800) Resp:  [0-27] 17 (03/29 0800) BP: (110-159)/(47-99) 110/61 mmHg (03/29 0800) SpO2:  [91 %-100 %] 92 % (03/29 0800) Arterial Line BP: (116-170)/(51-66) 170/66 mmHg (03/28 1700) Weight:  [89.1 kg (196 lb 6.9 oz)] 89.1 kg (196 lb 6.9 oz) (03/29 0500)  Hemodynamic parameters for last 24 hours:    Intake/Output from previous day: 03/28 0701 - 03/29 0700 In: 3020.1 [P.O.:480; I.V.:1890.1; IV Piggyback:650] Out: 2135 [Urine:2085; Blood:50] Intake/Output this shift: Total I/O In: 110 [P.O.:60; IV Piggyback:50] Out: -   General appearance: alert and cooperative Neurologic: intact Heart: regular rate and rhythm, S1, S2 normal, no murmur, click, rub or gallop Lungs: clear to auscultation bilaterally Extremities: extremities normal, atraumatic, no cyanosis or edema Wound: groin sites look good  Lab Results:  Recent Labs  06/12/15 1705 06/13/15 0442  WBC 3.7* 4.4  HGB 10.9* 11.8*  HCT 33.0* 35.2*  PLT 73* 59*   BMET:  Recent Labs  06/12/15 1704 06/12/15 1705 06/13/15 0442  NA 142  --  140  K 3.8  --  4.6  CL 105  --  109  CO2  --   --  20*  GLUCOSE 85  --  83  BUN 11  --  11  CREATININE 0.80 0.86 0.86  CALCIUM  --   --  8.4*    PT/INR:  Recent Labs  06/12/15 1054  LABPROT 16.6*  INR 1.33   ABG    Component Value Date/Time   PHART 7.435 06/08/2015 1056   HCO3 26.9* 06/12/2015 1034   TCO2 25 06/12/2015 1704   ACIDBASEDEF 1.0 05/04/2015 1257   O2SAT 98.0 06/12/2015 1034   CBG (last 3)   Recent Labs  06/12/15 2017 06/12/15 2304 06/13/15 0314  GLUCAP 103* 113* 89     Assessment/Plan: S/P Procedure(s) (LRB): TRANSCATHETER AORTIC VALVE REPLACEMENT, TRANSFEMORAL, possible transpical (N/A) TRANSESOPHAGEAL ECHOCARDIOGRAM (TEE) (N/A)  Doing well POD 1 s/p TAVR.  Transfer to 2W stepdown and continue ambulation.  2D echo today.  Plan home tomorrow.   LOS: 1 day    Gaye Pollack 06/13/2015

## 2015-06-13 NOTE — Progress Notes (Signed)
CARDIAC REHAB PHASE I   PRE:  Rate/Rhythm: 44 SR  BP:  Supine:   Sitting: 146/70  Standing:    SaO2: 96%RA  MODE:  Ambulation: 500 ft   POST:  Rate/Rhythm: 92 SR  BP:  Supine:   Sitting: 157/64  Standing:    SaO2: 96%RA 1430-1455 Pt walked 500 ft on RA with steady gait. Tolerated well. Stated less SOB than before procedure.    Graylon Good, RN BSN  06/13/2015 2:51 PM

## 2015-06-13 NOTE — Progress Notes (Signed)
Report given to 2W nurse, Juliann Pulse at this time.  Pt has now voided since foley dc'd.  No c/o pain.  No s/s of any distress.

## 2015-06-13 NOTE — Progress Notes (Signed)
Received to room 2W10 a transfer from 2S.  Pt is A&O, no c/o voiced.  Tele monitor applied and CCMD notified.  Oriented to room, call light and bed.  Call bell in reach, family at bedside.   [name]

## 2015-06-14 DIAGNOSIS — I350 Nonrheumatic aortic (valve) stenosis: Secondary | ICD-10-CM

## 2015-06-14 LAB — TYPE AND SCREEN
ABO/RH(D): A POS
Antibody Screen: NEGATIVE
Unit division: 0
Unit division: 0

## 2015-06-14 MED ORDER — TRIAMTERENE-HCTZ 37.5-25 MG PO CAPS: ORAL_CAPSULE | ORAL | Status: DC

## 2015-06-14 MED ORDER — CLOPIDOGREL BISULFATE 75 MG PO TABS
75.0000 mg | ORAL_TABLET | Freq: Every day | ORAL | Status: DC
Start: 2015-06-14 — End: 2015-07-13

## 2015-06-14 MED ORDER — TRAMADOL HCL 50 MG PO TABS: 50.0000 mg | ORAL_TABLET | ORAL | Status: DC | PRN

## 2015-06-14 NOTE — Progress Notes (Addendum)
      [address]       [place] [address]             [phone]        2 Days Post-Op Procedure(s) (LRB): TRANSCATHETER AORTIC VALVE REPLACEMENT, TRANSFEMORAL, possible transpical (N/A) TRANSESOPHAGEAL ECHOCARDIOGRAM (TEE) (N/A)  Subjective: Patient eating breakfast this am. Felt heart beat "funny a few times last night"  Objective: Vital signs in last 24 hours: Temp:  [98.2 F (36.8 C)-99.5 F (37.5 C)] 99.3 F (37.4 C) (03/30 0543) Pulse Rate:  [62-81] 70 (03/30 0543) Cardiac Rhythm:  [-] Heart block;Bundle branch block (03/30 0708) Resp:  [16-23] 16 (03/30 0543) BP: (110-137)/(58-84) 133/62 mmHg (03/30 0543) SpO2:  [92 %-99 %] 95 % (03/30 0543) Weight:  [199 lb 3.2 oz (90.357 kg)] 199 lb 3.2 oz (90.357 kg) (03/30 0543)   Current Weight  06/14/15 199 lb 3.2 oz (90.357 kg)       Intake/Output from previous day: 03/29 0701 - 03/30 0700 In: 350 [P.O.:300; IV Piggyback:50] Out: 375 [Urine:375]   Physical Exam:  Cardiovascular: RRR, no murmur Pulmonary: Clear to auscultation bilaterally; no rales, wheezes, or rhonchi. Abdomen: Soft, non tender, bowel sounds present. Extremities: No lower extremity edema. Wounds: Left groin wound is clean and dry.  No erythema or signs of infection.  Lab Results: CBC: Recent Labs  06/12/15 1705 06/13/15 0442  WBC 3.7* 4.4  HGB 10.9* 11.8*  HCT 33.0* 35.2*  PLT 73* 59*   BMET:  Recent Labs  06/12/15 1704 06/12/15 1705 06/13/15 0442  NA 142  --  140  K 3.8  --  4.6  CL 105  --  109  CO2  --   --  20*  GLUCOSE 85  --  83  BUN 11  --  11  CREATININE 0.80 0.86 0.86  CALCIUM  --   --  8.4*    PT/INR:  Lab Results  Component Value Date   INR 1.33 06/12/2015   INR 1.17 06/08/2015   INR 1.05 04/26/2015   ABG:  INR: Will add last result for INR, ABG once components are confirmed Will add last 4 CBG results once components are confirmed  Assessment/Plan:  1. CV - SR/first degree heart  block in the 90's. Has had runs of VT post op. He has sinus node dysfunction (s/p PPM). Echo done yesterday showed LVEF 55-60%, no perivalvular leak, peak gradient AV 40 mm Hg. On Lopressor 12.5 mg bid and Plavix 75 mg daily. . 2.  Pulmonary - On room air 3.  Acute blood loss anemia - H and H stable at 11.8 and 35.2 4. Thrombocytopenia-platelets down to 59,000. Not on Lovenox or ecasa. 5. Will discharge  ZIMMERMAN,DONIELLE MPA-C 06/14/2015,7:49 AM   Chart reviewed, patient examined, agree with above. He looks good and feels well. I think rhythm is ok. Echo looks good. Plan home today.

## 2015-06-14 NOTE — Progress Notes (Signed)
CARDIAC REHAB PHASE I   PRE:  Rate/Rhythm: 85 SR  BP:  Supine:   Sitting: 154/74  Standing:    SaO2: 96%RA  MODE:  Ambulation: 740 ft   POST:  Rate/Rhythm: 114 ST  BP:  Supine:   Sitting: 166/72  Standing:    SaO2: 96%RA 0928-1004 Pt stated he has walked a few times already this morning short distances independently. Pt walked 740 ft on RA with steady gait. Tolerated well. No VT noted. Pt slightly SOB. Gave pt heart healthy diet but he stated he eats what he wants. Encouraged him to watch salt. Gave walking instructions. Discussed CRP 2 but pt not interested in referral. Has treadmill and stationary bike at home. Encouraged not to use bike until groin healed.   Graylon Good, RN BSN  06/14/2015 10:00 AM

## 2015-06-14 NOTE — Discharge Summary (Signed)
Physician Discharge Summary       [address]       [place] [address]             [phone]    Patient ID: [name] MRN: [id number] DOB/AGE: 04-08-1934 81 y.o.  Admit date: 06/12/2015 Discharge date: 06/14/2015  Admission Diagnoses: Severe aortic stenosis  Active Diagnoses:  1. CAD (coronary artery disease) s/p MI/PTCA - Cx 1994. b. s/p CABG x5 in 1997. c. Abnl nuc 2003- occ SVG-RCA, patent seq SVG-OM1-dCxOM, patentl LIMA-LAD, patent, SVG-diag. d. Normal nuc 09/2012. 2. PAF (paroxysmal atrial fibrillation) ([place]) 3.OSA on CPAP 4. AAA (abdominal aortic aneurysm) ([place]) 5. H/O prostate cancer 6. Hyperlipidemia 7.Thrombocytopenia ([place]) 8. GERD (gastroesophageal reflux disease) 9. COPD (chronic obstructive pulmonary disease) ([place]) 10. HTN (hypertension) 11. PVD (peripheral vascular disease) ([place]) a. Rt ext iliac stenosis, last PV cath 2003, moderate stenosis last Lower Ext Dopplers 12/16/11 - Rt. ABI 0.96 Lt ABI 1.0. 12. Sinus node dysfunction ([place]) a. placed 1999, gen change 2011 - [employer]. 13. Carotid artery disease ([place]) 14. HOH (hard of hearing) 15. H/O: GI bleed 16. Back pain  Procedure (s):   Transcatheter Aortic Valve Replacement - Percutaneous Left Transfemoral Approach Edwards Sapien 3 THV (size 29 mm, model # 9600TFX, serial # [id number]) by Dr. Cyndia Bent on 06/12/2015.  History of Presenting Illness: The patient is a 81 year old gentleman with a history of coronary artery disease s/p CABG x 5 by me in 1997, PAF, sinus bradycardia s/p PPM followed by Dr. [name], PVD, COPD and OSA, as well as aortic stenosis. For the past couple years he has noticed some exertional dyspnea, fatigue and chest discomfort. His symptoms have been progressing but he says he still feels well. His most recent echo in January showed an increase in the mean gradient to 40 mm Hg with a peak of 72 mm Hg and mild to moderate AI. The AVA was  0.7 cm2. LVEF was 60-65%. He was evaluated by Dr. Burt Knack and had a cath on 05/04/2015 showing severe 3 vessel CAD with a calcified RCA with mild diffuse nonobstructive disease. The vein graft to the PDA is chronically occluded. The other bypasses are patent. The mean AV gradient is 28 mm Hg with an AVA of 1.08 cm2. Gated cardiac CT showed a trileaflet AV with anatomy suitable for a 29 mm Sapient 3 valve. Abdominal and pelvic CT showed small caliber external iliac arteries that did not appear to be adequate for transfemoral access. He has stage D severe symptomatic aortic stenosis with exertional fatigue, shortness of breath and chest pain. He is a little vague with his description of his symptoms but I think he probably has NYHA class 2 symptoms. The patient and his wife were counseled at length regarding treatment alternatives for management of severe symptomatic aortic stenosis. Alternative approaches such as conventional aortic valve replacement, transcatheter aortic valve replacement, and palliative medical therapy were compared and contrasted at length. The risks associated with conventional surgical aortic valve replacement were been discussed in detail, as were expectations for post-operative convalescence. Long-term prognosis with medical therapy was discussed.  Brief Hospital Course:  The patient was extubated the evening of surgery without difficulty. He remained afebrile and hemodynamically stable. Lopressor was started and titrated accordingly.  He had mild ABL anemia. He did not require a post op transfusion. His last H and H was stable at 11.8 and 35.2.[name] HGA1C pre op was  5.6.  The patient was felt  surgically stable for transfer from the ICU to [facility] for further convalescence on 06/13/2015. He continues to progress with cardiac rehab. He was ambulating on room air. He has been tolerating a diet and has had a bowel movement. Left groin wound is clean and dry. Echo done yesterday showed  LVEF 55-60%, no perivalvular leak, peak gradient AV 40 mm Hg. The patient is felt surgically stable for discharge today.   Latest Vital Signs: Blood pressure 133/62, pulse 70, temperature 99.3 F (37.4 C), temperature source Oral, resp. rate 16, height 5\' 6"  (1.676 m), weight 199 lb 3.2 oz (90.357 kg), SpO2 95 %.  Physical Exam: Cardiovascular: RRR, no murmur Pulmonary: Clear to auscultation bilaterally; no rales, wheezes, or rhonchi. Abdomen: Soft, non tender, bowel sounds present. Extremities: No lower extremity edema. Wounds: Left groin wound is clean and dry. No erythema or signs of infection.  Discharge Condition:Stable for discharge home  Recent laboratory studies:  Lab Results  Component Value Date   WBC 4.4 06/13/2015   HGB 11.8* 06/13/2015   HCT 35.2* 06/13/2015   MCV 93.4 06/13/2015   PLT 59* 06/13/2015   Lab Results  Component Value Date   NA 140 06/13/2015   K 4.6 06/13/2015   CL 109 06/13/2015   CO2 20* 06/13/2015   CREATININE 0.86 06/13/2015   GLUCOSE 83 06/13/2015    Diagnostic Studies: Dg Chest Port 1 View  06/12/2015  CLINICAL DATA:  Status post TAVR EXAM: PORTABLE CHEST 1 VIEW COMPARISON:  06/08/2015 FINDINGS: Pacing device is again noted. Cardiac shadow is stable. Postsurgical changes are again noted. Newly placed aortic valve replacement stent is noted. Right jugular sheath is seen kinked at the skin site. The lungs are well aerated without pneumothorax or focal infiltrate. IMPRESSION: Postoperative change without acute abnormality. Electronically Signed   By: Inez Catalina M.D.   On: 06/12/2015 11:21   Ct Angio Chest Aorta W/cm &/or Wo/cm  05/22/2015  CLINICAL DATA:  81 year old male with history of severe aortic stenosis. Preprocedural study prior to potential transcatheter aortic valve replacement (TAVR). EXAM: CT ANGIOGRAPHY CHEST, ABDOMEN AND PELVIS TECHNIQUE: Multidetector CT imaging through the chest, abdomen and pelvis was performed using the standard  protocol during bolus administration of intravenous contrast. Multiplanar reconstructed images and MIPs were obtained and reviewed to evaluate the vascular anatomy. CONTRAST:  152mL OMNIPAQUE IOHEXOL 300 MG/ML  SOLN COMPARISON:  Cardiac CT 05/11/2015. CT the abdomen pelvis 01/09/2012. FINDINGS: CTA CHEST FINDINGS Mediastinum/Lymph Nodes: Heart size is borderline enlarged. There is no significant pericardial fluid, thickening or pericardial calcification. There is atherosclerosis of the thoracic aorta, the great vessels of the mediastinum and the coronary arteries, including calcified atherosclerotic plaque in the left main, left anterior descending, left circumflex and right coronary arteries. Status post median sternotomy for CABG, including LIMA to the LAD. Severe thickening and calcification of the aortic valve. No pathologically enlarged mediastinal or hilar lymph nodes. Esophagus is unremarkable in appearance. No axillary lymphadenopathy. Right-sided pacemaker device in position with lead tips terminating in the right atrial appendage and right ventricular apex. Lungs/Pleura: No acute consolidative airspace disease. No pleural effusions. No suspicious appearing pulmonary nodules or masses. Mild diffuse bronchial thickening. Mild paraseptal emphysema noted predominantly in the lung apices. Musculoskeletal/Soft Tissues: Median sternotomy wires. There are no aggressive appearing lytic or blastic lesions noted in the visualized portions of the skeleton. CTA ABDOMEN AND PELVIS FINDINGS Hepatobiliary: The liver has a slightly shrunken appearance and nodular contour, suggestive of cirrhosis. No discrete cystic or solid hepatic  lesions are identified. No intra or extrahepatic biliary ductal dilatation. Gallbladder is normal in appearance. Pancreas: The pancreatic mass. No pancreatic ductal dilatation. No pancreatic or peripancreatic fluid or inflammatory changes. Spleen: Unremarkable. Adrenals/Urinary Tract:  Calcifications in the left renal hila are favored to be vascular, although the presence of nonobstructive calculi is not excluded. Sub cm low-attenuation lesion in the interpolar region of the left kidney is too small to definitively characterize, but is statistically likely a small cyst. Right kidney and bilateral adrenal glands are normal in appearance. No hydroureteronephrosis or perinephric stranding to indicate urinary tract obstruction at this time. The appearance of the urinary bladder is normal. Stomach/Bowel: The appearance of the stomach is normal. No pathologic dilatation of small bowel or colon. Vascular/Lymphatic: Extensive atherosclerosis throughout the abdominal and pelvic vasculature, including fusiform aneurysmal dilatation of the infrarenal abdominal aorta which measures up to 4.1 x 3.8 cm, shortly above the bifurcation. There is a large burden of atheromatous plaque and/or mural thrombus within the aneurysmally dilated segment of the infrarenal abdominal aorta. 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 Vascular findings and measurements pertinent to potential TAVR procedure, as detailed below. No lymphadenopathy noted in the abdomen or pelvis. Reproductive: Prostate gland is either diminutive or surgically absent. Seminal vesicles are unremarkable in appearance. Other: Trace volume of ascites in the low anatomic pelvis. No larger volume of ascites. No pneumoperitoneum. Musculoskeletal: There are no aggressive appearing lytic or blastic lesions noted in the visualized portions of the skeleton. VASCULAR MEASUREMENTS PERTINENT TO TAVR: AORTA: Minimal Aortic Diameter -  19 x 17 mm Severity of Aortic Calcification -  moderate to severe RIGHT PELVIS: Right Common Iliac Artery - Minimal Diameter - 7.9 x 8.3 mm Tortuosity - mild Calcification - moderate to severe Right External Iliac Artery - Minimal Diameter - 3.4 x 3.7 mm Tortuosity - severe  Calcification - mild Right Common Femoral Artery - Minimal Diameter - 6.5 x 4.8 mm Tortuosity - mild Calcification - mild to moderate LEFT PELVIS: Left Common Iliac Artery - Minimal Diameter - 8.4 x 8.4 mm Tortuosity - mild Calcification - moderate-to-severe Left External Iliac Artery - Minimal Diameter - 5.7 x 5.0 mm Tortuosity - severe Calcification - mild Left Common Femoral Artery - Minimal Diameter - 7.6 x 4.9 mm Tortuosity - mild Calcification - moderate Review of the MIP images confirms the above findings. IMPRESSION: 1. Vascular findings and measurements pertinent to potential TAVR procedure, as detailed above. This patient does not appear to have suitable pelvic arterial access on either side (particularly on the right side) secondary to the small caliber of the vessels and extreme tortuosity of the external iliac segments. 2. Severe calcifications of the aortic valve, compatible with the reported clinical history of severe aortic stenosis. 3. Mild diffuse bronchial wall thickening with mild paraseptal emphysema; imaging findings suggestive of underlying COPD. 4. Atherosclerosis, including left main and 3 vessel coronary artery disease. Status post median sternotomy for CABG, including LIMA to the LAD. In addition, there is aneurysmal dilatation of the infrarenal abdominal aorta which measures up to 4.1 x 3.8 cm, and contains a large burden of atheromatous plaque and/or mural thrombus within the aneurysmal segment. 5. Morphologic changes in the liver compatible with underlying cirrhosis. 6. Additional incidental findings, as above. Electronically Signed   By: Vinnie Langton M.D.   On: 05/22/2015 15:02   Ct Angio Abd/pel W/ And/or W/o  05/22/2015  CLINICAL DATA:  81 year old male with  history of severe aortic stenosis. Preprocedural study prior to potential transcatheter aortic valve replacement (TAVR). EXAM: CT ANGIOGRAPHY CHEST, ABDOMEN AND PELVIS TECHNIQUE: Multidetector CT imaging through the chest,  abdomen and pelvis was performed using the standard protocol during bolus administration of intravenous contrast. Multiplanar reconstructed images and MIPs were obtained and reviewed to evaluate the vascular anatomy. CONTRAST:  110mL OMNIPAQUE IOHEXOL 300 MG/ML  SOLN COMPARISON:  Cardiac CT 05/11/2015. CT the abdomen pelvis 01/09/2012. FINDINGS: CTA CHEST FINDINGS Mediastinum/Lymph Nodes: Heart size is borderline enlarged. There is no significant pericardial fluid, thickening or pericardial calcification. There is atherosclerosis of the thoracic aorta, the great vessels of the mediastinum and the coronary arteries, including calcified atherosclerotic plaque in the left main, left anterior descending, left circumflex and right coronary arteries. Status post median sternotomy for CABG, including LIMA to the LAD. Severe thickening and calcification of the aortic valve. No pathologically enlarged mediastinal or hilar lymph nodes. Esophagus is unremarkable in appearance. No axillary lymphadenopathy. Right-sided pacemaker device in position with lead tips terminating in the right atrial appendage and right ventricular apex. Lungs/Pleura: No acute consolidative airspace disease. No pleural effusions. No suspicious appearing pulmonary nodules or masses. Mild diffuse bronchial thickening. Mild paraseptal emphysema noted predominantly in the lung apices. Musculoskeletal/Soft Tissues: Median sternotomy wires. There are no aggressive appearing lytic or blastic lesions noted in the visualized portions of the skeleton. CTA ABDOMEN AND PELVIS FINDINGS Hepatobiliary: The liver has a slightly shrunken appearance and nodular contour, suggestive of cirrhosis. No discrete cystic or solid hepatic lesions are identified. No intra or extrahepatic biliary ductal dilatation. Gallbladder is normal in appearance. Pancreas: The pancreatic mass. No pancreatic ductal dilatation. No pancreatic or peripancreatic fluid or inflammatory changes.  Spleen: Unremarkable. Adrenals/Urinary Tract: Calcifications in the left renal hila are favored to be vascular, although the presence of nonobstructive calculi is not excluded. Sub cm low-attenuation lesion in the interpolar region of the left kidney is too small to definitively characterize, but is statistically likely a small cyst. Right kidney and bilateral adrenal glands are normal in appearance. No hydroureteronephrosis or perinephric stranding to indicate urinary tract obstruction at this time. The appearance of the urinary bladder is normal. Stomach/Bowel: The appearance of the stomach is normal. No pathologic dilatation of small bowel or colon. Vascular/Lymphatic: Extensive atherosclerosis throughout the abdominal and pelvic vasculature, including fusiform aneurysmal dilatation of the infrarenal abdominal aorta which measures up to 4.1 x 3.8 cm, shortly above the bifurcation. There is a large burden of atheromatous plaque and/or mural thrombus within the aneurysmally dilated segment of the infrarenal abdominal aorta. 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 Vascular findings and measurements pertinent to potential TAVR procedure, as detailed below. No lymphadenopathy noted in the abdomen or pelvis. Reproductive: Prostate gland is either diminutive or surgically absent. Seminal vesicles are unremarkable in appearance. Other: Trace volume of ascites in the low anatomic pelvis. No larger volume of ascites. No pneumoperitoneum. Musculoskeletal: There are no aggressive appearing lytic or blastic lesions noted in the visualized portions of the skeleton. VASCULAR MEASUREMENTS PERTINENT TO TAVR: AORTA: Minimal Aortic Diameter -  19 x 17 mm Severity of Aortic Calcification -  moderate to severe RIGHT PELVIS: Right Common Iliac Artery - Minimal Diameter - 7.9 x 8.3 mm Tortuosity - mild Calcification - moderate to severe Right External Iliac Artery - Minimal Diameter  - 3.4 x 3.7 mm Tortuosity - severe Calcification - mild Right Common Femoral Artery - Minimal Diameter -  6.5 x 4.8 mm Tortuosity - mild Calcification - mild to moderate LEFT PELVIS: Left Common Iliac Artery - Minimal Diameter - 8.4 x 8.4 mm Tortuosity - mild Calcification - moderate-to-severe Left External Iliac Artery - Minimal Diameter - 5.7 x 5.0 mm Tortuosity - severe Calcification - mild Left Common Femoral Artery - Minimal Diameter - 7.6 x 4.9 mm Tortuosity - mild Calcification - moderate Review of the MIP images confirms the above findings. IMPRESSION: 1. Vascular findings and measurements pertinent to potential TAVR procedure, as detailed above. This patient does not appear to have suitable pelvic arterial access on either side (particularly on the right side) secondary to the small caliber of the vessels and extreme tortuosity of the external iliac segments. 2. Severe calcifications of the aortic valve, compatible with the reported clinical history of severe aortic stenosis. 3. Mild diffuse bronchial wall thickening with mild paraseptal emphysema; imaging findings suggestive of underlying COPD. 4. Atherosclerosis, including left main and 3 vessel coronary artery disease. Status post median sternotomy for CABG, including LIMA to the LAD. In addition, there is aneurysmal dilatation of the infrarenal abdominal aorta which measures up to 4.1 x 3.8 cm, and contains a large burden of atheromatous plaque and/or mural thrombus within the aneurysmal segment. 5. Morphologic changes in the liver compatible with underlying cirrhosis. 6. Additional incidental findings, as above. Electronically Signed   By: Vinnie Langton M.D.   On: 05/22/2015 15:02    Discharge Medications:   Medication List    STOP taking these medications        aspirin EC 81 MG tablet     nitroGLYCERIN 0.4 MG SL tablet  Commonly known as:  NITROSTAT      TAKE these medications        acetaminophen 325 MG tablet  Commonly known as:   TYLENOL  Take 650 mg by mouth every 6 (six) hours as needed.     clopidogrel 75 MG tablet  Commonly known as:  PLAVIX  Take 1 tablet (75 mg total) by mouth daily with breakfast.     Fish Oil 1200 MG Caps  Take 1,200 mg by mouth every evening.     metoprolol tartrate 25 MG tablet  Commonly known as:  LOPRESSOR  Take 0.5 tablets (12.5 mg total) by mouth 2 (two) times daily.     ranitidine 150 MG tablet  Commonly known as:  ZANTAC  Take 150 mg by mouth 2 (two) times daily.     simvastatin 80 MG tablet  Commonly known as:  ZOCOR  TAKE ONE TABLET BY MOUTH AT BEDTIME     traMADol 50 MG tablet  Commonly known as:  ULTRAM  Take 1 tablet (50 mg total) by mouth every 4 (four) hours as needed for moderate pain.     triamterene-hydrochlorothiazide 37.5-25 MG capsule  Commonly known as:  DYAZIDE  Take 1 capsule by mouth daily - Monday through Friday  Start taking on:  06/16/2015     vitamin C 500 MG tablet  Commonly known as:  ASCORBIC ACID  Take 500 mg by mouth every evening.       The patient has been discharged on:   1.Beta Blocker:  Yes [ x  ]                              No   [   ]  If No, reason:  2.Ace Inhibitor/ARB: Yes [   ]                                     No  [  x  ]                                     If No, reason:Labile BP, preserved LVEF, no CAD  3.Statin:   Yes [ x  ]                  No  [   ]                  If No, reason:  4.Ecasa:  Yes  [   ]                  No   [ x  ]                  If No, reason: Thrombocytopenia-hope to restart as an outpatient  Follow Up Appointments:     Follow-up Information    Follow up with Sherren Mocha, MD On 07/13/2015.   Specialty:  Cardiology   Why:  Echo to be taken on 07/13/2015 at 9:30 am;Appointment time with Dr. Burt Knack is at 10:30 am   Contact information:   [address] [place] [address] [phone]       Signed: Lars Pinks  MPA-C 06/14/2015, 10:22 AM

## 2015-06-15 ENCOUNTER — Telehealth: Payer: Self-pay | Admitting: Internal Medicine

## 2015-06-15 NOTE — Telephone Encounter (Signed)
NeW Message  Pt no showed appt w/ remote on 3/29- will be able to send today. Please call back and discuss.

## 2015-06-15 NOTE — Progress Notes (Signed)
Remote pacemaker transmission.   

## 2015-06-15 NOTE — Telephone Encounter (Signed)
Spoke w/ pt and informed him that his remote transmission was received.  

## 2015-07-13 ENCOUNTER — Encounter: Payer: Self-pay | Admitting: Cardiovascular Disease

## 2015-07-13 ENCOUNTER — Encounter: Payer: Self-pay | Admitting: Cardiology

## 2015-07-13 ENCOUNTER — Other Ambulatory Visit: Payer: Self-pay

## 2015-07-13 ENCOUNTER — Ambulatory Visit (HOSPITAL_COMMUNITY): Payer: Medicare Other | Attending: Cardiovascular Disease

## 2015-07-13 ENCOUNTER — Ambulatory Visit (INDEPENDENT_AMBULATORY_CARE_PROVIDER_SITE_OTHER): Payer: Medicare Other | Admitting: Cardiovascular Disease

## 2015-07-13 VITALS — BP 140/68 | HR 62 | Ht 67.0 in | Wt 197.1 lb

## 2015-07-13 DIAGNOSIS — I517 Cardiomegaly: Secondary | ICD-10-CM | POA: Diagnosis not present

## 2015-07-13 DIAGNOSIS — Z953 Presence of xenogenic heart valve: Secondary | ICD-10-CM | POA: Insufficient documentation

## 2015-07-13 DIAGNOSIS — Z952 Presence of prosthetic heart valve: Secondary | ICD-10-CM

## 2015-07-13 DIAGNOSIS — Z954 Presence of other heart-valve replacement: Secondary | ICD-10-CM

## 2015-07-13 DIAGNOSIS — I350 Nonrheumatic aortic (valve) stenosis: Secondary | ICD-10-CM | POA: Insufficient documentation

## 2015-07-13 DIAGNOSIS — I2510 Atherosclerotic heart disease of native coronary artery without angina pectoris: Secondary | ICD-10-CM | POA: Diagnosis not present

## 2015-07-13 DIAGNOSIS — I359 Nonrheumatic aortic valve disorder, unspecified: Secondary | ICD-10-CM | POA: Diagnosis present

## 2015-07-13 LAB — CUP PACEART REMOTE DEVICE CHECK
Battery Remaining Percentage: 81 %
Brady Statistic AP VP Percent: 1 %
Brady Statistic AP VS Percent: 11 %
Brady Statistic AS VP Percent: 1 %
Brady Statistic AS VS Percent: 88 %
Date Time Interrogation Session: 20170331140040
Implantable Lead Implant Date: 19990914
Implantable Lead Location: 753860
Lead Channel Impedance Value: 480 Ohm
Lead Channel Pacing Threshold Amplitude: 0.5 V
Lead Channel Pacing Threshold Pulse Width: 0.4 ms
Lead Channel Pacing Threshold Pulse Width: 0.7 ms
Lead Channel Sensing Intrinsic Amplitude: 12 mV
Lead Channel Setting Pacing Amplitude: 2 V
Lead Channel Setting Pacing Amplitude: 2.25 V
MDC IDC LEAD IMPLANT DT: 19990914
MDC IDC LEAD LOCATION: 753859
MDC IDC MSMT BATTERY REMAINING LONGEVITY: 104 mo
MDC IDC MSMT BATTERY VOLTAGE: 2.93 V
MDC IDC MSMT LEADCHNL RA IMPEDANCE VALUE: 360 Ohm
MDC IDC MSMT LEADCHNL RA SENSING INTR AMPL: 0.5 mV
MDC IDC MSMT LEADCHNL RV PACING THRESHOLD AMPLITUDE: 2 V
MDC IDC PG SERIAL: 7130714
MDC IDC SET LEADCHNL RV PACING PULSEWIDTH: 0.7 ms
MDC IDC SET LEADCHNL RV SENSING SENSITIVITY: 2 mV
MDC IDC STAT BRADY RA PERCENT PACED: 11 %
MDC IDC STAT BRADY RV PERCENT PACED: 1 %
Pulse Gen Model: 2210

## 2015-07-13 MED ORDER — CLOPIDOGREL BISULFATE 75 MG PO TABS: 75.0000 mg | ORAL_TABLET | Freq: Every day | ORAL | Status: DC

## 2015-07-13 NOTE — Patient Instructions (Signed)
Medication Instructions:  Your physician recommends that you continue on your current medications as directed. Please refer to the Current Medication list given to you today.  You can STOP Plavix 12/13/15.   Labwork: No new orders.   Testing/Procedures: Your physician has requested that you have an echocardiogram in 1 YEAR. Echocardiography is a painless test that uses sound waves to create images of your heart. It provides your doctor with information about the size and shape of your heart and how well your heart's chambers and valves are working. This procedure takes approximately one hour. There are no restrictions for this procedure.  Follow-Up: Your physician wants you to follow-up in: 1 YEAR with Dr Burt Knack.  You will receive a reminder letter in the mail two months in advance. If you don't receive a letter, please call our office to schedule the follow-up appointment.   Any Other Special Instructions Will Be Listed Below (If Applicable).     If you need a refill on your cardiac medications before your next appointment, please call your pharmacy.

## 2015-07-13 NOTE — Progress Notes (Signed)
Cardiology Office Note Date:  07/13/2015   ID:  [name], DOB August 24, 1934, MRN [id number]  PCP:  [name], MD  Cardiologist:  Sherren Mocha, MD    Chief Complaint  Patient presents with  . Severe Aortic Stenosis    no cp,sob,lee, or claudication    History of Present Illness: [name] is a 81 y.o. male who presents for 30 day follow-up after undergoing TAVR 06/12/2015. The patient has a history of previous bypass surgery in 1997. Also has paroxysmal atrial fibrillation. He had been followed for progressive aortic stenosis and developed symptoms of shortness of breath and weakness with exertion. He underwent TAVR via a percutaneous left transfemoral approach using a 29 mm Edwards Sapien 3 valve. His early postoperative course was uncomplicated and he was discharged home in 48 hours. He presents today for follow-up.  He's had some discomfort in the left leg since his surgery. This is improving and has been primarily limited to the left upper thigh. He had no bruising or hematoma. Otherwise he indicates that his breathing is much better. He still has mild shortness of breath, but before TAVR he couldn't walk to his mailbox without resting. He now is able to walk to his mailbox and back without stopping. He denies chest pain, edema, orthopnea, or PND.   Past Medical History  Diagnosis Date  . CAD (coronary artery disease)     a. s/p MI/PTCA - Cx 1994. b. s/p CABG x5 in 1997. c. Abnl nuc 2003- occ SVG-RCA, patent seq SVG-OM1-dCxOM, patentl LIMA-LAD, patent, SVG-diag. d. Normal nuc 09/2012.  [name] PAF (paroxysmal atrial fibrillation) ([place])     a. Remote per records.   . Syncope 11/28/97  . AAA (abdominal aortic aneurysm) ([place])     a. Last duplex - 3.8cm 01/2013 - due 01/2014.  . OSA on CPAP   . H/O prostate cancer     a. s/p radical retropubic prostatectomy with bilateral pelvic lymph node dissection  . Aortic stenosis, mild     Last echo 05/04/12 +LVH  . Back pain     hx  epidural injections  . Acne rosacea   . H/O: GI bleed     mild, neg. colonoscopy  . Hyperlipidemia   . Thrombocytopenia (HCC)     chronic, mild  . GERD (gastroesophageal reflux disease)   . COPD (chronic obstructive pulmonary disease) ([place])   . HTN (hypertension)   . Baker's cyst     Lt.  [name] PVD (peripheral vascular disease) ([place])     a. Rt ext iliac stenosis, last PV cath 2003, moderate stenosis last Lower Ext Dopplers 12/16/11 - Rt. ABI 0.96  Lt ABI 1.0.  . Pacemaker -[place] with change out 2006  . Sinus node dysfunction ([place]) 01/20/2013  . Hypertensive heart disease   . Sick sinus syndrome ([place])     a. placed 1999, gen change 2011 - St. Jude.  . Valvular heart disease     a. Echo 04/2012: mild-mod AS, mild AI, mild MR.  . Carotid artery disease ([place])     a. Duplex 01/2013: mildly abnormal. Mild plaque without significant diameter reduction. Repeat recommended 11/15.  [name] HOH (hard of hearing)   . Dizziness and giddiness 08/04/2013  . Cancer ([place])   . Aortic stenosis 05/07/2012  . Anginal pain ([place]) 1996  . Pneumonia     history  . Arthritis     shoulders    Past Surgical History  Procedure  Laterality Date  . Coronary artery bypass graft  1997    LIMA-LAD; VG-diag; seq VG-1st OM & distal LCX; VG-RCA  . Pacemaker insertion  11/28/97    pacesetter--ERI 2011  . Pacemaker generator change  08/15/2009    St. Jude accent  . Suprapubic prostatectomy    . Cardiac catheterization  2003    VG to RCA occluded, other grafts patent  . Coronary angioplasty  03/29/92    PTCA to LCX  . Cataracts    . Cataract extraction Bilateral     with lens  . Cardiac catheterization N/A 05/04/2015    Procedure: Right/Left Heart Cath and Coronary/Graft Angiography;  Surgeon: Sherren Mocha, MD;  Location: [facility];  Service: Cardiovascular;  Laterality: N/A;  . Colonoscopy    . Transcatheter aortic valve replacement, transfemoral N/A 06/12/2015    Procedure: TRANSCATHETER AORTIC  VALVE REPLACEMENT, TRANSFEMORAL, possible transpical;  Surgeon: Sherren Mocha, MD;  Location: [facility];  Service: Open Heart Surgery;  Laterality: N/A;  . Tee without cardioversion N/A 06/12/2015    Procedure: TRANSESOPHAGEAL ECHOCARDIOGRAM (TEE);  Surgeon: Sherren Mocha, MD;  Location: [facility];  Service: Open Heart Surgery;  Laterality: N/A;    Current Outpatient Prescriptions  Medication Sig Dispense Refill  . aspirin 81 MG tablet Take 81 mg by mouth daily.    . clopidogrel (PLAVIX) 75 MG tablet Take 1 tablet (75 mg total) by mouth daily with breakfast. 90 tablet 2  . metoprolol tartrate (LOPRESSOR) 25 MG tablet Take 0.5 tablets (12.5 mg total) by mouth 2 (two) times daily.    . Omega-3 Fatty Acids (FISH OIL) 1200 MG CAPS Take 1,200 mg by mouth every evening.    . ranitidine (ZANTAC) 150 MG tablet Take 150 mg by mouth 2 (two) times daily.     . simvastatin (ZOCOR) 80 MG tablet TAKE ONE TABLET BY MOUTH AT BEDTIME 90 tablet 3  . triamterene-hydrochlorothiazide (DYAZIDE) 37.5-25 MG capsule Take 1 capsule by mouth daily - Monday through Friday 90 capsule 3  . vitamin C (ASCORBIC ACID) 500 MG tablet Take 500 mg by mouth every evening.     No current facility-administered medications for this visit.    Allergies:   Review of patient's allergies indicates no known allergies.   Social History:  The patient  reports that he quit smoking about 20 years ago. His smoking use included Cigarettes. He has a 96 pack-year smoking history. He has never used smokeless tobacco. He reports that he does not drink alcohol or use illicit drugs.   Family History:  [name]  family history includes Cancer in his brother; Colon cancer in his father; Heart Problems in his brother and sister.   ROS:  Please see the history of present illness.  All other systems are reviewed and negative.   PHYSICAL EXAM: VS:  BP 140/68 mmHg  Pulse 62  Ht 5\' 7"  (1.702 m)  Wt 197 lb 1.9 oz (89.413 kg)  BMI 30.87 kg/m2 , BMI Body  mass index is 30.87 kg/(m^2). GEN: Well nourished, well developed, in no acute distress HEENT: normal Neck: no JVD, no masses. No carotid bruits Cardiac: RRR with 2/6 systolic ejection murmur on the RUSB, no diastolic murmur                Respiratory:  clear to auscultation bilaterally, normal work of breathing GI: soft, nontender, nondistended, + BS MS: no deformity or atrophy Ext: no pretibial edema, pedal pulses 2+= bilaterally. Bilateral groin sites are clear  with no bruising, mass, or tenderness. Skin: warm and dry, no rash Neuro:  Strength and sensation are intact Psych: euthymic mood, full affect  EKG:  EKG is not ordered today.  Recent Labs: 06/08/2015: ALT 27 06/13/2015: BUN 11; Creatinine, Ser 0.86; Hemoglobin 11.8*; Magnesium 2.1; Platelets 59*; Potassium 4.6; Sodium 140   Lipid Panel     Component Value Date/Time   CHOL 129 04/26/2015 1620   TRIG 207* 04/26/2015 1620   HDL 39* 04/26/2015 1620   CHOLHDL 3.3 04/26/2015 1620   VLDL 41* 04/26/2015 1620   LDLCALC 49 04/26/2015 1620      Wt Readings from Last 3 Encounters:  07/13/15 197 lb 1.9 oz (89.413 kg)  06/14/15 199 lb 3.2 oz (90.357 kg)  06/08/15 196 lb 14.4 oz (89.313 kg)    Cardiac Studies Reviewed: 2D Echo personally reviewed. Formal interpretation pending. Peak and mean gradients are 30 and 11 mmHg, respectively. There is trace paravalvular AI.  ASSESSMENT AND PLAN: Severe aortic stenosis, now s/p TAVR with improved functional capacity, currently with NYHA II symptoms. He is concerned about continuation of aspirin and Plavix as he has had some significant problems with epistaxis in the past. He hasn't had too much trouble over the last month, so I've encouraged him to continue on dual antiplatelet therapy if he can tolerate for 6 months. He will continue on his current cardiac medications. I'll see him back for his one year valve clinic visit. Overall he is doing very well now one month out from TAVR. He will  keep regular cardiology follow-up with Dr [name] and Dr Sallyanne Kuster.  Current medicines are reviewed with the patient today.  The patient does not have concerns regarding medicines.  Labs/ tests ordered today include:  Orders Placed This Encounter  Procedures  . Echocardiogram    Disposition:   FU one year  Signed, Sherren Mocha, MD  07/13/2015 1:12 PM    [facility] [place], [place], [place]  [address] Phone: [phone]; Fax: [phone]

## 2015-07-29 ENCOUNTER — Encounter: Payer: Self-pay | Admitting: Internal Medicine

## 2015-09-03 ENCOUNTER — Telehealth: Payer: Self-pay | Admitting: Cardiovascular Disease

## 2015-09-03 NOTE — Telephone Encounter (Signed)
Pt states Dr Radford Pax has managed his CPAP equipment in the past.  Pt states about 2 years ago pressures were turned down, but he has a new mask now and feels adjustments need to be made in pressure. Pt advised I will forward to [facility] to follow up with patient.

## 2015-09-03 NOTE — Telephone Encounter (Signed)
New Message  Pt calling to speak with RN. Pt states that is pressure was turned down on his c-pad, but now needs it to be turned back up. Pt ask does he have to have a prescription to get his machine press turned back up. Please call back to discuss

## 2015-09-04 NOTE — Telephone Encounter (Signed)
Jonni Sanger with [facility] has handled patient issues, and patient has scheduled a follow-up appointment with [facility].

## 2015-09-04 NOTE — Telephone Encounter (Signed)
Spoke with patient to let him know that I will call [facility] to see what can be done in Dr. Theodosia Blender absence.   He was appreciative of the call.  I have left a message for [facility] with [place].

## 2015-09-12 ENCOUNTER — Ambulatory Visit (INDEPENDENT_AMBULATORY_CARE_PROVIDER_SITE_OTHER): Payer: Medicare Other | Admitting: *Deleted

## 2015-09-12 DIAGNOSIS — I495 Sick sinus syndrome: Secondary | ICD-10-CM

## 2015-09-12 LAB — CUP PACEART REMOTE DEVICE CHECK
Brady Statistic AS VP Percent: 1 %
Brady Statistic AS VS Percent: 92 %
Implantable Lead Implant Date: 19990914
Implantable Lead Location: 753860
Lead Channel Pacing Threshold Amplitude: 0.5 V
Lead Channel Pacing Threshold Pulse Width: 0.7 ms
Lead Channel Sensing Intrinsic Amplitude: 12 mV
Lead Channel Setting Pacing Amplitude: 2 V
Lead Channel Setting Pacing Amplitude: 2.125
MDC IDC LEAD IMPLANT DT: 19990914
MDC IDC LEAD LOCATION: 753859
MDC IDC MSMT BATTERY REMAINING LONGEVITY: 105 mo
MDC IDC MSMT BATTERY REMAINING PERCENTAGE: 81 %
MDC IDC MSMT BATTERY VOLTAGE: 2.93 V
MDC IDC MSMT LEADCHNL RA IMPEDANCE VALUE: 390 Ohm
MDC IDC MSMT LEADCHNL RA PACING THRESHOLD PULSEWIDTH: 0.4 ms
MDC IDC MSMT LEADCHNL RA SENSING INTR AMPL: 0.5 mV
MDC IDC MSMT LEADCHNL RV IMPEDANCE VALUE: 540 Ohm
MDC IDC MSMT LEADCHNL RV PACING THRESHOLD AMPLITUDE: 1.875 V
MDC IDC SESS DTM: 20170628131527
MDC IDC SET LEADCHNL RV PACING PULSEWIDTH: 0.7 ms
MDC IDC SET LEADCHNL RV SENSING SENSITIVITY: 2 mV
MDC IDC STAT BRADY AP VP PERCENT: 1 %
MDC IDC STAT BRADY AP VS PERCENT: 6.9 %
MDC IDC STAT BRADY RA PERCENT PACED: 7 %
MDC IDC STAT BRADY RV PERCENT PACED: 1 %
Pulse Gen Model: 2210
Pulse Gen Serial Number: 7130714

## 2015-09-12 NOTE — Progress Notes (Signed)
Remote pacemaker transmission.   

## 2015-09-14 ENCOUNTER — Encounter: Payer: Self-pay | Admitting: Cardiology

## 2015-09-24 ENCOUNTER — Other Ambulatory Visit: Payer: Self-pay | Admitting: Internal Medicine

## 2015-10-10 ENCOUNTER — Other Ambulatory Visit: Payer: Self-pay | Admitting: Internal Medicine

## 2015-10-16 ENCOUNTER — Other Ambulatory Visit: Payer: Self-pay | Admitting: Internal Medicine

## 2015-10-18 ENCOUNTER — Telehealth: Payer: Self-pay | Admitting: Cardiology

## 2015-10-18 ENCOUNTER — Other Ambulatory Visit: Payer: Self-pay | Admitting: Internal Medicine

## 2015-10-18 NOTE — Telephone Encounter (Signed)
New Message   *STAT* If patient is at the pharmacy, call can be transferred to refill team.   1. Which medications need to be refilled? (please list name of each medication and dose if known) Nitroglycerin  2. Which pharmacy/location (including street and city if local pharmacy) is medication to be sent to? Pt states its [facility]  3. Do they need a 30 day or 90 day supply? Pt stated he did not know  Pt states he spoke with and RN and stated it was sent to the pharmacy 5 days ago and want to f/u. Stating the pharmacy still does not have the prescription. Please call back to discuss

## 2015-10-18 NOTE — Telephone Encounter (Signed)
[name]- this patient is s/p TAVR. I'm not sure what he is referring to in regards to his NTG being sent in, but wanted to check with his AS, what are the thoughts on his NTG refill? Dr. Burt Knack saw him in April.

## 2015-10-19 NOTE — Telephone Encounter (Signed)
Follow up  Pt returning [place] calls about his refill of medication.  Please follow up with pt.

## 2015-10-19 NOTE — Telephone Encounter (Signed)
Left message on machine for pt to contact the office.  I will speak with the pt when he calls back.

## 2015-10-19 NOTE — Telephone Encounter (Signed)
Discussed with Dr Burt Knack and the pt is okay to take Nitroglycerin following TAVR.  Nitroglycerin is not on the pt's medication list so I left a message for the pt to contact the office and clarify why he is requesting NTG (Pt does have a Hx of CAD).

## 2015-10-23 ENCOUNTER — Telehealth: Payer: Self-pay | Admitting: Internal Medicine

## 2015-10-23 ENCOUNTER — Other Ambulatory Visit: Payer: Self-pay | Admitting: Internal Medicine

## 2015-10-23 MED ORDER — NITROGLYCERIN 0.4 MG SL SUBL: 0.4000 mg | SUBLINGUAL_TABLET | SUBLINGUAL | 3 refills | Status: DC | PRN

## 2015-10-23 NOTE — Telephone Encounter (Signed)
°*  STAT* If patient is at the pharmacy, call can be transferred to refill team.   1. Which medications need to be refilled? (please list name of each medication and dose if known) Nitroglycerin  2. Which pharmacy/location (including street and city if local pharmacy) is medication to be sent to?[facility] -[phone]  3. Do they need a 30 day or 90 day supply? [place]

## 2015-10-23 NOTE — Telephone Encounter (Signed)
I spoke with the patient. He is aware I have sent his NTG in for him. He is only using these periodically and he had an old RX that had run out.

## 2015-10-23 NOTE — Telephone Encounter (Signed)
Please advise on refill request as this med was removed from patients med list with a reason of discontinued by provider. Thanks, MI

## 2015-10-23 NOTE — Telephone Encounter (Signed)
Per Cath done 05/04/15 with Dr. Burt Knack:  1. Severe 3 vessel CAD with diffuse calcific LAD stenosis and severe proximal LCx stenosis 2. Calcified RCA with mild diffuse nonobstructive disease 3. S/P aortocoronary bypass with patent LIMA-LAD, SVG-diagonal, and sequential SVG-ramus and OM2. Chronic occlusion of the SVG-right PDA 4. Moderate-severe aortic stenosis with mean gradient 28 mmHg and calculated AVA 1.08 square cm   Will send in NTG refill to [facility].

## 2015-10-23 NOTE — Telephone Encounter (Signed)
Previous encounter open for the same thing- will close this encounter.

## 2015-10-23 NOTE — Telephone Encounter (Signed)
[name]      10/23/15 4:29 PM  Note     *STAT* If patient is at the pharmacy, call can be transferred to refill team.   1. Which medications need to be refilled? (please list name of each medication and dose if known) Nitroglycerin  2. Which pharmacy/location (including street and city if local pharmacy) is medication to be sent to?[facility] -[phone]  3. Do they need a 30 day or 90 day supply? 30          10/23/15 4:30 PM  [name] routed this conversation to [place], [facility]      10/23/15 4:47 PM  Note    Please advise on refill request as this med was removed from patients med list with a reason of discontinued by provider. Thanks, MI

## 2015-11-06 ENCOUNTER — Encounter: Payer: Self-pay | Admitting: Cardiology

## 2015-11-19 ENCOUNTER — Encounter: Payer: Self-pay | Admitting: Cardiology

## 2015-11-20 ENCOUNTER — Ambulatory Visit (INDEPENDENT_AMBULATORY_CARE_PROVIDER_SITE_OTHER): Payer: Medicare Other | Admitting: Cardiology

## 2015-11-20 ENCOUNTER — Encounter: Payer: Self-pay | Admitting: Cardiology

## 2015-11-20 VITALS — BP 144/60 | HR 59 | Ht 67.0 in | Wt 199.2 lb

## 2015-11-20 DIAGNOSIS — G4733 Obstructive sleep apnea (adult) (pediatric): Secondary | ICD-10-CM | POA: Diagnosis not present

## 2015-11-20 DIAGNOSIS — E669 Obesity, unspecified: Secondary | ICD-10-CM

## 2015-11-20 DIAGNOSIS — I119 Hypertensive heart disease without heart failure: Secondary | ICD-10-CM

## 2015-11-20 HISTORY — DX: Obstructive sleep apnea (adult) (pediatric): G47.33

## 2015-11-20 NOTE — Progress Notes (Signed)
Cardiology Office Note    Date:  11/20/2015   ID:  Dacen Prudencio, DOB 06-15-1934, MRN [id number]  PCP:  [name], MD  Cardiologist:  Fransico Him, MD   Chief Complaint  Patient presents with  . Sleep Apnea  . Hypertension    History of Present Illness:  [name] is a 81 y.o. male with a history of PVD with AAA, severe AS s/p TAVR, ASCAD and OSA on CPAP who presents for evaluation of OSA.  He had his initial PSG done in 2013 which showed severe OSA with an AHI of 38/hr and underwent BiPAP titration to 19/15cm H2O.  He has not followed up with a sleep specialist in some time.  He tolerates his BiPAP without any problems.  He feels rested in the am and has no significant daytime sleepiness although once in a while he will take a nap if watching TV.  He tolerates his full mask except that his face breaks out. He feels the pressure is adequate.  Recently he felt like the mask was leaking and he wasn't getting enough air and was placed on auto BiPAP which he has been tolerating much better. He has no mouth dryness or nasal congestion.   Past Medical History:  Diagnosis Date  . AAA (abdominal aortic aneurysm) ([place])    a. Last duplex - 3.8cm 01/2013 - due 01/2014.  [name] Acne rosacea   . Anginal pain ([place]) 1996  . Aortic stenosis 05/07/2012  . Aortic stenosis, mild    Last echo 05/04/12 +LVH  . Arthritis    shoulders  . Back pain    hx epidural injections  . Baker's cyst    Lt.  [name] CAD (coronary artery disease)    a. s/p MI/PTCA - Cx 1994. b. s/p CABG x5 in 1997. c. Abnl nuc 2003- occ SVG-RCA, patent seq SVG-OM1-dCxOM, patentl LIMA-LAD, patent, SVG-diag. d. Normal nuc 09/2012.  . Cancer ([place])   . Carotid artery disease ([place])    a. Duplex 01/2013: mildly abnormal. Mild plaque without significant diameter reduction. Repeat recommended 11/15.  [name] COPD (chronic obstructive pulmonary disease) ([place])   . Dizziness and giddiness 08/04/2013  . GERD (gastroesophageal reflux disease)     . H/O prostate cancer    a. s/p radical retropubic prostatectomy with bilateral pelvic lymph node dissection  . H/O: GI bleed    mild, neg. colonoscopy  . HOH (hard of hearing)   . HTN (hypertension)   . Hyperlipidemia   . Hypertensive heart disease   . OSA on CPAP   . OSA treated with BiPAP 11/20/2015   Severe with [facility] 38/hr  . Pacemaker -[place] with change out 2006  . PAF (paroxysmal atrial fibrillation) ([place])    a. Remote per records.   . Pneumonia    history  . PVD (peripheral vascular disease) ([place])    a. Rt ext iliac stenosis, last PV cath 2003, moderate stenosis last Lower Ext Dopplers 12/16/11 - Rt. ABI 0.96  Lt ABI 1.0.  . Sick sinus syndrome ([place])    a. placed 1999, gen change 2011 - St. Jude.  . Sinus node dysfunction ([place]) 01/20/2013  . Syncope 11/28/97  . Thrombocytopenia (HCC)    chronic, mild  . Valvular heart disease    a. Echo 04/2012: mild-mod AS, mild AI, mild MR.    Past Surgical History:  Procedure Laterality Date  . CARDIAC CATHETERIZATION  2003   VG to RCA occluded, other grafts  patent  . CARDIAC CATHETERIZATION N/A 05/04/2015   Procedure: Right/Left Heart Cath and Coronary/Graft Angiography;  Surgeon: Sherren Mocha, MD;  Location: [facility];  Service: Cardiovascular;  Laterality: N/A;  . CATARACT EXTRACTION Bilateral    with lens  . cataracts    . COLONOSCOPY    . CORONARY ANGIOPLASTY  03/29/92   PTCA to LCX  . CORONARY ARTERY BYPASS GRAFT  1997   LIMA-LAD; VG-diag; seq VG-1st OM & distal LCX; VG-RCA  . PACEMAKER GENERATOR CHANGE  08/15/2009   St. Jude accent  . PACEMAKER INSERTION  11/28/97   pacesetter--ERI 2011  . SUPRAPUBIC PROSTATECTOMY    . TEE WITHOUT CARDIOVERSION N/A 06/12/2015   Procedure: TRANSESOPHAGEAL ECHOCARDIOGRAM (TEE);  Surgeon: Sherren Mocha, MD;  Location: [facility];  Service: Open Heart Surgery;  Laterality: N/A;  . TRANSCATHETER AORTIC VALVE REPLACEMENT, TRANSFEMORAL N/A 06/12/2015   Procedure: TRANSCATHETER  AORTIC VALVE REPLACEMENT, TRANSFEMORAL, possible transpical;  Surgeon: Sherren Mocha, MD;  Location: [facility];  Service: Open Heart Surgery;  Laterality: N/A;    Current Medications: Outpatient Medications Prior to Visit  Medication Sig Dispense Refill  . aspirin 81 MG tablet Take 81 mg by mouth daily.    . clopidogrel (PLAVIX) 75 MG tablet Take 1 tablet (75 mg total) by mouth daily with breakfast. 90 tablet 2  . metoprolol tartrate (LOPRESSOR) 25 MG tablet Take 0.5 tablets (12.5 mg total) by mouth 2 (two) times daily.    . metoprolol tartrate (LOPRESSOR) 25 MG tablet TAKE 1/2 TABLET BY MOUTH TWICE DAILY 90 tablet 2  . nitroGLYCERIN (NITROSTAT) 0.4 MG SL tablet Place 1 tablet (0.4 mg total) under the tongue every 5 (five) minutes as needed for chest pain. 25 tablet 3  . Omega-3 Fatty Acids (FISH OIL) 1200 MG CAPS Take 1,200 mg by mouth every evening.    . ranitidine (ZANTAC) 150 MG tablet Take 150 mg by mouth 2 (two) times daily.     . simvastatin (ZOCOR) 80 MG tablet TAKE 1 TABLET BY MOUTH AT BEDTIME 90 tablet 2  . triamterene-hydrochlorothiazide (DYAZIDE) 37.5-25 MG capsule Take 1 capsule by mouth daily - Monday through Friday 90 capsule 3  . vitamin C (ASCORBIC ACID) 500 MG tablet Take 500 mg by mouth every evening.     No facility-administered medications prior to visit.      Allergies:   Review of patient's allergies indicates no known allergies.   Social History   Social History  . Marital status: Widowed    Spouse name: N/A  . Number of children: 5  . Years of education: 7th   Occupational History  . Retired    Social History Main Topics  . Smoking status: Former Smoker    Packs/day: 2.00    Years: 48.00    Types: Cigarettes    Quit date: 03/18/1995  . Smokeless tobacco: Never Used  . Alcohol use No     Comment: occ  . Drug use: No  . Sexual activity: Not Asked   Other Topics Concern  . None   Social History Narrative  . None     Family History:  [name]  family history includes Cancer in his brother; Colon cancer in his father; Heart Problems in his brother and sister.   ROS:   Please see the history of present illness.    ROS All other systems reviewed and are negative.   PHYSICAL EXAM:   VS:  BP (!) 144/60   Pulse (!) 59  Ht 5\' 7"  (1.702 m)   Wt 199 lb 3.2 oz (90.4 kg)   SpO2 97%   BMI 31.20 kg/m    GEN: Well nourished, well developed, in no acute distress  HEENT: normal  Neck: no JVD, carotid bruits, or masses Cardiac: RRR; no murmurs, rubs, or gallops,no edema.  Intact distal pulses bilaterally.  Respiratory:  clear to auscultation bilaterally, normal work of breathing GI: soft, nontender, nondistended, + BS MS: no deformity or atrophy  Skin: warm and dry, no rash Neuro:  Alert and Oriented x 3, Strength and sensation are intact Psych: euthymic mood, full affect  Wt Readings from Last 3 Encounters:  11/20/15 199 lb 3.2 oz (90.4 kg)  07/13/15 197 lb 1.9 oz (89.4 kg)  06/14/15 199 lb 3.2 oz (90.4 kg)      Studies/Labs Reviewed:   EKG:  EKG is not ordered today.   Recent Labs: 06/08/2015: ALT 27 06/13/2015: BUN 11; Creatinine, Ser 0.86; Hemoglobin 11.8; Magnesium 2.1; Platelets 59; Potassium 4.6; Sodium 140   Lipid Panel    Component Value Date/Time   CHOL 129 04/26/2015 1620   TRIG 207 (H) 04/26/2015 1620   HDL 39 (L) 04/26/2015 1620   CHOLHDL 3.3 04/26/2015 1620   VLDL 41 (H) 04/26/2015 1620   LDLCALC 49 04/26/2015 1620    Additional studies/ records that were reviewed today include:  PSG and PAP titration, PAP dowlonad    ASSESSMENT:    1. OSA treated with BiPAP   2. Hypertensive heart disease without heart failure   3. Obesity (BMI 30-39.9)      PLAN:  In order of problems listed above:  HTN - BP controlled on current meds.Continue BB/diuretic. 2.  OSA - the patient is tolerating PAP therapy well without any problems. The PAP download was reviewed today and showed an AHI of 0.7/hr on auto PAP  with 99% compliance in using more than 4 hours nightly.  The patient has been using and benefiting from PAP use and will continue to benefit from therapy.   He feels much better on auto BiPAP but has COPD so I will change his auto to 18/14cm and repeat a d/l in 2 weeks.   3.  Obesity - I have encouraged him to get into a routine exercise program and cut back on carbs and portions.      Medication Adjustments/Labs and Tests Ordered: Current medicines are reviewed at length with the patient today.  Concerns regarding medicines are outlined above.  Medication changes, Labs and Tests ordered today are listed in the Patient Instructions below.  There are no Patient Instructions on file for this visit.   Signed, Fransico Him, MD  11/20/2015 10:58 AM    [facility] [place], [place], [place]  [address] Phone: [phone]; Fax: [phone]

## 2015-11-20 NOTE — Patient Instructions (Signed)
Medication Instructions:  Your physician recommends that you continue on your current medications as directed. Please refer to the Current Medication list given to you today.   Labwork: None  Testing/Procedures: None  Follow-Up: Your physician wants you to follow-up in: 1 year with Dr. Radford Pax. You will receive a reminder letter in the mail two months in advance. If you don't receive a letter, please call our office to schedule the follow-up appointment.   Any Other Special Instructions Will Be Listed Below (If Applicable). We have ordered your BiPAP to be set at 18/14 cm H2O.  If you have any questions about your BiPAP, please call Romelle Starcher, CMA directly at [phone].    If you need a refill on your cardiac medications before your next appointment, please call your pharmacy.

## 2015-12-12 ENCOUNTER — Other Ambulatory Visit: Payer: Self-pay

## 2015-12-12 ENCOUNTER — Ambulatory Visit (INDEPENDENT_AMBULATORY_CARE_PROVIDER_SITE_OTHER): Payer: Medicare Other | Admitting: *Deleted

## 2015-12-12 DIAGNOSIS — I495 Sick sinus syndrome: Secondary | ICD-10-CM | POA: Diagnosis not present

## 2015-12-12 NOTE — Progress Notes (Signed)
Remote pacemaker transmission.   

## 2015-12-19 ENCOUNTER — Encounter: Payer: Self-pay | Admitting: Cardiology

## 2016-01-02 ENCOUNTER — Encounter: Payer: Self-pay | Admitting: Cardiology

## 2016-01-10 LAB — CUP PACEART REMOTE DEVICE CHECK
Battery Remaining Longevity: 102 mo
Brady Statistic AP VS Percent: 7.8 %
Brady Statistic AS VS Percent: 91 %
Date Time Interrogation Session: 20170927114457
Implantable Lead Implant Date: 19990914
Implantable Lead Location: 753859
Lead Channel Impedance Value: 360 Ohm
Lead Channel Pacing Threshold Amplitude: 1.75 V
Lead Channel Sensing Intrinsic Amplitude: 0.5 mV
Lead Channel Setting Pacing Pulse Width: 0.7 ms
MDC IDC LEAD IMPLANT DT: 19990914
MDC IDC LEAD LOCATION: 753860
MDC IDC MSMT BATTERY REMAINING PERCENTAGE: 81 %
MDC IDC MSMT BATTERY VOLTAGE: 2.93 V
MDC IDC MSMT LEADCHNL RA PACING THRESHOLD AMPLITUDE: 0.5 V
MDC IDC MSMT LEADCHNL RA PACING THRESHOLD PULSEWIDTH: 0.4 ms
MDC IDC MSMT LEADCHNL RV IMPEDANCE VALUE: 480 Ohm
MDC IDC MSMT LEADCHNL RV PACING THRESHOLD PULSEWIDTH: 0.7 ms
MDC IDC MSMT LEADCHNL RV SENSING INTR AMPL: 9.5 mV
MDC IDC SET LEADCHNL RA PACING AMPLITUDE: 2 V
MDC IDC SET LEADCHNL RV PACING AMPLITUDE: 2 V
MDC IDC SET LEADCHNL RV SENSING SENSITIVITY: 2 mV
MDC IDC STAT BRADY AP VP PERCENT: 1 %
MDC IDC STAT BRADY AS VP PERCENT: 1 %
MDC IDC STAT BRADY RA PERCENT PACED: 7.9 %
MDC IDC STAT BRADY RV PERCENT PACED: 1 %
Pulse Gen Model: 2210
Pulse Gen Serial Number: 7130714

## 2016-02-11 ENCOUNTER — Ambulatory Visit (HOSPITAL_COMMUNITY)
Admission: RE | Admit: 2016-02-11 | Discharge: 2016-02-11 | Disposition: A | Discharge status: Home / Self Care | Payer: Medicare Other | Source: Ambulatory Visit | Attending: Cardiovascular Disease | Admitting: Cardiovascular Disease

## 2016-02-11 DIAGNOSIS — I700 Atherosclerosis of aorta: Secondary | ICD-10-CM | POA: Diagnosis not present

## 2016-02-11 DIAGNOSIS — I714 Abdominal aortic aneurysm, without rupture, unspecified: Secondary | ICD-10-CM

## 2016-02-12 ENCOUNTER — Telehealth: Payer: Self-pay | Admitting: Cardiovascular Disease

## 2016-02-12 NOTE — Telephone Encounter (Signed)
Per result note: Aneurysm is small and unchanged in size. There seems to be worsening narrowing in the iliac arteries in the pelvis. Will just monitor unless he develops symptoms (pain in legs or buttocks with walking that improves with rest). Please recheck in 12 months  Pt notified of above results

## 2016-02-12 NOTE — Telephone Encounter (Signed)
Pt is returning [place] call for result;ts

## 2016-02-28 ENCOUNTER — Encounter: Payer: Self-pay | Admitting: Internal Medicine

## 2016-03-11 ENCOUNTER — Encounter (INDEPENDENT_AMBULATORY_CARE_PROVIDER_SITE_OTHER): Payer: Self-pay

## 2016-03-11 ENCOUNTER — Encounter: Payer: Self-pay | Admitting: Internal Medicine

## 2016-03-11 ENCOUNTER — Ambulatory Visit (INDEPENDENT_AMBULATORY_CARE_PROVIDER_SITE_OTHER): Payer: Medicare Other | Admitting: Internal Medicine

## 2016-03-11 VITALS — BP 162/78 | HR 60 | Ht 66.0 in | Wt 199.0 lb

## 2016-03-11 DIAGNOSIS — I495 Sick sinus syndrome: Secondary | ICD-10-CM

## 2016-03-11 DIAGNOSIS — Z950 Presence of cardiac pacemaker: Secondary | ICD-10-CM | POA: Diagnosis not present

## 2016-03-11 DIAGNOSIS — I480 Paroxysmal atrial fibrillation: Secondary | ICD-10-CM | POA: Diagnosis not present

## 2016-03-11 LAB — CUP PACEART INCLINIC DEVICE CHECK
Brady Statistic RA Percent Paced: 9.6 %
Date Time Interrogation Session: 20171226140850
Implantable Lead Implant Date: 19990914
Implantable Lead Location: 753859
Implantable Pulse Generator Implant Date: 20110601
Lead Channel Impedance Value: 400 Ohm
Lead Channel Impedance Value: 510 Ohm
Lead Channel Pacing Threshold Amplitude: 1.625 V
Lead Channel Pacing Threshold Pulse Width: 0.7 ms
Lead Channel Sensing Intrinsic Amplitude: 12 mV
Lead Channel Setting Pacing Amplitude: 2 V
Lead Channel Setting Sensing Sensitivity: 2 mV
MDC IDC LEAD IMPLANT DT: 19990914
MDC IDC LEAD LOCATION: 753860
MDC IDC MSMT BATTERY VOLTAGE: 2.92 V
MDC IDC MSMT LEADCHNL RA PACING THRESHOLD AMPLITUDE: 0.5 V
MDC IDC MSMT LEADCHNL RA PACING THRESHOLD PULSEWIDTH: 0.4 ms
MDC IDC MSMT LEADCHNL RA SENSING INTR AMPL: 0.5 mV
MDC IDC SET LEADCHNL RV PACING AMPLITUDE: 1.875
MDC IDC SET LEADCHNL RV PACING PULSEWIDTH: 0.7 ms
MDC IDC STAT BRADY RV PERCENT PACED: 0.38 %
Pulse Gen Model: 2210
Pulse Gen Serial Number: 7130714

## 2016-03-11 MED ORDER — METOPROLOL TARTRATE 25 MG PO TABS: 25.0000 mg | ORAL_TABLET | Freq: Two times a day (BID) | ORAL | 3 refills | Status: DC

## 2016-03-11 MED ORDER — NITROGLYCERIN 0.4 MG SL SUBL: 0.4000 mg | SUBLINGUAL_TABLET | SUBLINGUAL | 3 refills | Status: DC | PRN

## 2016-03-11 MED ORDER — TRIAMTERENE-HCTZ 37.5-25 MG PO CAPS: ORAL_CAPSULE | ORAL | 3 refills | Status: DC

## 2016-03-11 NOTE — Patient Instructions (Addendum)
Medication Instructions: - Your physician has recommended you make the following change in your medication:  1) Increase metoprolol tartrate 25 mg- take one whole tablet by mouth twice daily  Labwork: - none ordered  Procedures/Testing: - none ordered  Follow-Up: - Your physician recommends that you schedule a follow-up appointment in: 4-6 weeks with Tommye Standard, PA  - Remote monitoring is used to monitor your Pacemaker of ICD from home. This monitoring reduces the number of office visits required to check your device to one time per year. It allows [place] to keep an eye on the functioning of your device to ensure it is working properly. You are scheduled for a device check from home on 06/10/16. You may send your transmission at any time that day. If you have a wireless device, the transmission will be sent automatically. After your physician reviews your transmission, you will receive a postcard with your next transmission date.  - Your physician wants you to follow-up in: 1 year with Dr. [name]. You will receive a reminder letter in the mail two months in advance. If you don't receive a letter, please call our office to schedule the follow-up appointment.  Any Additional Special Instructions Will Be Listed Below (If Applicable).     If you need a refill on your cardiac medications before your next appointment, please call your pharmacy.

## 2016-03-11 NOTE — Progress Notes (Signed)
Patient Care Team: [name], MD as PCP - General (Family Medicine) Deboraha Sprang, MD as Consulting Physician (Cardiology)   HPI  [name] is a 81 y.o. male Seen in follow-up for pacemaker implanted remotely with generator replacement 2011. 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  He has a history of coronary artery disease with prior bypass grafting Myoview scanning 7/14 demonstrated EF 56% and no ischemia     Had prog SOB>>1/17  EF normal  Severe AS >>> TAVR with Edwards-Sapien 2/17   He walks vigorously daily. He says he feels like he is 64    Denies chest pain   followup-- Duplex AA 3.7 cm but perhaps worsening of iliac disease Results and note reviewed    His BP has been high at home  Date      4/15    Cr  0.93      K   4.8     3/17    Cr  0.86      K   4.6      Past Medical History:  Diagnosis Date  . AAA (abdominal aortic aneurysm) ([place])    a. Last duplex - 3.8cm 01/2013 - due 01/2014.  [name] Acne rosacea   . Anginal pain ([place]) 1996  . Aortic stenosis 05/07/2012  . Aortic stenosis, mild    Last echo 05/04/12 +LVH  . Arthritis    shoulders  . Back pain    hx epidural injections  . Baker's cyst    Lt.  [name] CAD (coronary artery disease)    a. s/p MI/PTCA - Cx 1994. b. s/p CABG x5 in 1997. c. Abnl nuc 2003- occ SVG-RCA, patent seq SVG-OM1-dCxOM, patentl LIMA-LAD, patent, SVG-diag. d. Normal nuc 09/2012.  . Cancer ([place])   . Carotid artery disease ([place])    a. Duplex 01/2013: mildly abnormal. Mild plaque without significant diameter reduction. Repeat recommended 11/15.  [name] COPD (chronic obstructive pulmonary disease) ([place])   . Dizziness and giddiness 08/04/2013  . GERD (gastroesophageal reflux disease)   . H/O prostate cancer    a. s/p radical retropubic prostatectomy with bilateral pelvic lymph node dissection  . H/O: GI bleed    mild, neg. colonoscopy  . HOH (hard of hearing)   . HTN (hypertension)    . Hyperlipidemia   . Hypertensive heart disease   . OSA on CPAP   . OSA treated with BiPAP 11/20/2015   Severe with [facility] 38/hr  . Pacemaker -[place] with change out 2006  . PAF (paroxysmal atrial fibrillation) ([place])    a. Remote per records.   . Pneumonia    history  . PVD (peripheral vascular disease) ([place])    a. Rt ext iliac stenosis, last PV cath 2003, moderate stenosis last Lower Ext Dopplers 12/16/11 - Rt. ABI 0.96  Lt ABI 1.0.  . Sick sinus syndrome ([place])    a. placed 1999, gen change 2011 - St. Jude.  . Sinus node dysfunction ([place]) 01/20/2013  . Syncope 11/28/97  . Thrombocytopenia (H[facility])    chronic, mild  . Valvular heart disease    a. Echo 04/2012: mild-mod AS, mild AI, mild MR.    Past Surgical History:  Procedure Laterality Date  . CARDIAC CATHETERIZATION  2003   VG to RCA occluded, other grafts patent  . CARDIAC CATHETERIZATION N/A 05/04/2015   Procedure: Right/Left Heart Cath and Coronary/Graft Angiography;  Surgeon: Sherren Mocha, MD;  Location: [place] [facility];  Service: Cardiovascular;  Laterality: N/A;  . CATARACT EXTRACTION Bilateral    with lens  . cataracts    . COLONOSCOPY    . CORONARY ANGIOPLASTY  03/29/92   PTCA to LCX  . CORONARY ARTERY BYPASS GRAFT  1997   LIMA-LAD; VG-diag; seq VG-1st OM & distal LCX; VG-RCA  . PACEMAKER GENERATOR CHANGE  08/15/2009   St. Jude accent  . PACEMAKER INSERTION  11/28/97   pacesetter--ERI 2011  . SUPRAPUBIC PROSTATECTOMY    . TEE WITHOUT CARDIOVERSION N/A 06/12/2015   Procedure: TRANSESOPHAGEAL ECHOCARDIOGRAM (TEE);  Surgeon: Sherren Mocha, MD;  Location: [facility];  Service: Open Heart Surgery;  Laterality: N/A;  . TRANSCATHETER AORTIC VALVE REPLACEMENT, TRANSFEMORAL N/A 06/12/2015   Procedure: TRANSCATHETER AORTIC VALVE REPLACEMENT, TRANSFEMORAL, possible transpical;  Surgeon: Sherren Mocha, MD;  Location: [place];  Service: Open Heart Surgery;  Laterality: N/A;    Current Outpatient Prescriptions  Medication  Sig Dispense Refill  . aspirin 81 MG tablet Take 81 mg by mouth daily.    . metoprolol tartrate (LOPRESSOR) 25 MG tablet Take 0.5 tablets (12.5 mg total) by mouth 2 (two) times daily.    . nitroGLYCERIN (NITROSTAT) 0.4 MG SL tablet Place 1 tablet (0.4 mg total) under the tongue every 5 (five) minutes as needed for chest pain. 25 tablet 3  . Omega-3 Fatty Acids (FISH OIL) 1200 MG CAPS Take 1,200 mg by mouth every evening.    . ranitidine (ZANTAC) 150 MG tablet Take 150 mg by mouth 2 (two) times daily.     . simvastatin (ZOCOR) 80 MG tablet TAKE 1 TABLET BY MOUTH AT BEDTIME 90 tablet 2  . triamterene-hydrochlorothiazide (DYAZIDE) 37.5-25 MG capsule Take 1 capsule by mouth daily - Monday through Friday 90 capsule 3  . vitamin C (ASCORBIC ACID) 500 MG tablet Take 500 mg by mouth every evening.     No current facility-administered medications for this visit.     No Known Allergies  Review of Systems negative except from HPI and PMH  Physical Exam BP (!) 162/78   Pulse 60   Ht 5\' 6"  (1.676 m)   Wt 199 lb (90.3 kg)   BMI 32.12 kg/m  Well developed and well nourished in no acute distress HENT normal E scleral and icterus clear Neck Supple JVP flat; carotids brisk  Clear to ausculation No chest wall tenderness or increasing discomfort with forced maneuvers of his left arm Regular rate and rhythm, 2/6 systolic murmur Soft with active bowel sounds No clubbing cyanosis no Edema Alert and oriented, grossly normal motor and sensory function Skin Warm and Dry  ECG demonstrates sinus rhythm at 59 Intervals 20/10/44 Nonspecific ST changes  Assessment and  Plan  Aortic stenosis-severe s/p TAVI  Coronary artery disease with prior bypass grafting\  Pacemaker-St. Jude [name] device was interrogated.  The information was reviewed. No changes were made in the programming.     Sinus node dysfunction  Hypertension   Ventricular tachycardia   Paroxysmal atrial fibrillation.  The  patient has had no intercurrent paroxysmal atrial fibrillation as identified by his device.  VT non sustained-- with normal EF and absence of symptoms, no specific treatment.  With BP elevated however we will increase his metoprolol 12.5>>25   And reassess in 4-6 weeks with further uptitration if symptoms and BP allow  Without symptoms of ischemia   Heart rate excursion is supported by his pacemaker

## 2016-04-14 ENCOUNTER — Encounter: Payer: Medicare Other | Admitting: Physician Assistant

## 2016-04-14 NOTE — Progress Notes (Signed)
Cardiology Office Note Date:  04/15/2016  Patient ID:  [name], DOB 05/30/1934, MRN [id number] PCP:  [name], MD  Cardiologist:  Dr. Sallyanne Kuster Electrophysiologist: Dr. [name] OSA: Dr. Radford Pax    Chief Complaint: planned f/u  History of Present Illness: [name] is a 82 y.o. male with history of PAFib, tachy/brady syndrome with PPM, CAD (PTCA in 1994, CABG 2003), COPD, HTN, HLD, OSA, PVD with mod disease LE, VHD with TAVR 06/12/15 with Dr. Burt Knack.  He comes to the office today to be seen for Dr. [name], last seen by him 03/11/16, at that time doing well, had only NSVT noted on his device check and no specific treatment given normal LVEF, his BP was elevated and his metoprolol increased to return for re-evaluation and up-titration if need.  The patient is feeling well, he walks on the treadmill or around the block "a couple times" for exercise, denies any exertional intolerances.  He mentions an intermittent left sided CP that he has had on/off ofr the last 15 years, unchanged in behavior/severity, not exertional, sometimes he thinks almost predictable when he eats hamburger meat.  No palpitations, no dizziness, near syncope or syncope, no SOB.  He reports home BP generally 120's-140's/80's some have been higher 150-160 range.   DEVICE information: SJM dual chamber PPM, implanted 1999, gen change 08/15/09, Dr. Sallyanne Kuster   Past Medical History:  Diagnosis Date  . AAA (abdominal aortic aneurysm) ([place])    a. Last duplex - 3.8cm 01/2013 - due 01/2014.  [name] Acne rosacea   . Anginal pain ([place]) 1996  . Aortic stenosis 05/07/2012  . Aortic stenosis, mild    Last echo 05/04/12 +LVH  . Arthritis    shoulders  . Back pain    hx epidural injections  . Baker's cyst    Lt.  [name] CAD (coronary artery disease)    a. s/p MI/PTCA - Cx 1994. b. s/p CABG x5 in 1997. c. Abnl nuc 2003- occ SVG-RCA, patent seq SVG-OM1-dCxOM, patentl LIMA-LAD, patent, SVG-diag. d. Normal nuc 09/2012.  .  Cancer ([place])   . Carotid artery disease ([place])    a. Duplex 01/2013: mildly abnormal. Mild plaque without significant diameter reduction. Repeat recommended 11/15.  [name] COPD (chronic obstructive pulmonary disease) ([place])   . Dizziness and giddiness 08/04/2013  . GERD (gastroesophageal reflux disease)   . H/O prostate cancer    a. s/p radical retropubic prostatectomy with bilateral pelvic lymph node dissection  . H/O: GI bleed    mild, neg. colonoscopy  . HOH (hard of hearing)   . HTN (hypertension)   . Hyperlipidemia   . Hypertensive heart disease   . OSA on CPAP   . OSA treated with BiPAP 11/20/2015   Severe with [facility] 38/hr  . Pacemaker -[place] with change out 2006  . PAF (paroxysmal atrial fibrillation) ([place])    a. Remote per records.   . Pneumonia    history  . PVD (peripheral vascular disease) ([place])    a. Rt ext iliac stenosis, last PV cath 2003, moderate stenosis last Lower Ext Dopplers 12/16/11 - Rt. ABI 0.96  Lt ABI 1.0.  . Sick sinus syndrome ([place])    a. placed 1999, gen change 2011 - St. Jude.  . Sinus node dysfunction ([place]) 01/20/2013  . Syncope 11/28/97  . Thrombocytopenia (HCC)    chronic, mild  . Valvular heart disease    a. Echo 04/2012: mild-mod AS, mild AI, mild MR.  Past Surgical History:  Procedure Laterality Date  . CARDIAC CATHETERIZATION  2003   VG to RCA occluded, other grafts patent  . CARDIAC CATHETERIZATION N/A 05/04/2015   Procedure: Right/Left Heart Cath and Coronary/Graft Angiography;  Surgeon: Sherren Mocha, MD;  Location: [place] [facility];  Service: Cardiovascular;  Laterality: N/A;  . CATARACT EXTRACTION Bilateral    with lens  . cataracts    . COLONOSCOPY    . CORONARY ANGIOPLASTY  03/29/92   PTCA to LCX  . CORONARY ARTERY BYPASS GRAFT  1997   LIMA-LAD; VG-diag; seq VG-1st OM & distal LCX; VG-RCA  . PACEMAKER GENERATOR CHANGE  08/15/2009   St. Jude accent  . PACEMAKER INSERTION  11/28/97   pacesetter--ERI 2011  . SUPRAPUBIC  PROSTATECTOMY    . TEE WITHOUT CARDIOVERSION N/A 06/12/2015   Procedure: TRANSESOPHAGEAL ECHOCARDIOGRAM (TEE);  Surgeon: Sherren Mocha, MD;  Location: [place];  Service: Open Heart Surgery;  Laterality: N/A;  . TRANSCATHETER AORTIC VALVE REPLACEMENT, TRANSFEMORAL N/A 06/12/2015   Procedure: TRANSCATHETER AORTIC VALVE REPLACEMENT, TRANSFEMORAL, possible transpical;  Surgeon: Sherren Mocha, MD;  Location: [facility];  Service: Open Heart Surgery;  Laterality: N/A;    Current Outpatient Prescriptions  Medication Sig Dispense Refill  . aspirin 81 MG tablet Take 81 mg by mouth daily.    . metoprolol tartrate (LOPRESSOR) 25 MG tablet Take 1 tablet (25 mg total) by mouth 2 (two) times daily. 180 tablet 3  . nitroGLYCERIN (NITROSTAT) 0.4 MG SL tablet Place 1 tablet (0.4 mg total) under the tongue every 5 (five) minutes as needed for chest pain. 25 tablet 3  . Omega-3 Fatty Acids (FISH OIL) 1200 MG CAPS Take 1,200 mg by mouth every evening.    . ranitidine (ZANTAC) 150 MG tablet Take 150 mg by mouth 2 (two) times daily.     . simvastatin (ZOCOR) 80 MG tablet TAKE 1 TABLET BY MOUTH AT BEDTIME 90 tablet 2  . triamterene-hydrochlorothiazide (DYAZIDE) 37.5-25 MG capsule Take 1 capsule by mouth daily - Monday through Friday 90 capsule 3  . vitamin C (ASCORBIC ACID) 500 MG tablet Take 500 mg by mouth every evening.     No current facility-administered medications for this visit.     Allergies:   Patient has no known allergies.   Social History:  The patient  reports that he quit smoking about 21 years ago. His smoking use included Cigarettes. He has a 96.00 pack-year smoking history. He has never used smokeless tobacco. He reports that he does not drink alcohol or use drugs.   Family History:  [name] family history includes Cancer in his brother; Colon cancer in his father; Heart Problems in his brother and sister.  ROS:  Please see the history of present illness.  All other systems are reviewed and  otherwise negative.   PHYSICAL EXAM:  VS:  BP (!) 150/78   Pulse 62   Ht 5\' 6"  (1.676 m)   Wt 201 lb (91.2 kg)   BMI 32.44 kg/m  BMI: Body mass index is 32.44 kg/m. Well nourished, well developed, in no acute distress  HEENT: normocephalic, atraumatic  Neck: no JVD, carotid bruits or masses Cardiac:  RRR; no significant murmurs, no rubs, or gallops Lungs:  CTA b/l, no wheezing, rhonchi or rales  Abd: soft, nontender MS: no deformity or atrophy Ext: no edema  Skin: warm and dry, no rash Neuro:  No gross deficits appreciated Psych: euthymic mood, full affect  PPM site is stable, no tethering or  discomfort   EKG:  Done 03/11/16: SR, V paced, PR 233ms, QRS 154ms, QTc 42ms PPM interrogation done today and reviewed by myself: stable battery and lead status, <1% V paced, once AMS, looks like AFlutter 20 seconds duation  05/04/15: R/LHC Conclusion   Ost LM lesion, 40% stenosed.  Ost Cx to Prox Cx lesion, 90% stenosed.  Ost LAD to Prox LAD lesion, 75% stenosed.  Ost Ramus lesion, 99% stenosed.  Mid LAD lesion, 50% stenosed.  Prox RCA lesion, 50% stenosed.  Dist RCA lesion, 25% stenosed.  SVG .  Widely patent SVG-diagonal  SVG was injected is normal in caliber.  Widely patent sequential SVG-Ramus and OM2  SVG was injected .  Origin lesion, 100% stenosed.   1. Severe 3 vessel CAD with diffuse calcific LAD stenosis and severe proximal LCx stenosis 2. Calcified RCA with mild diffuse nonobstructive disease 3. S/P aortocoronary bypass with patent LIMA-LAD, SVG-diagonal, and sequential SVG-ramus and OM2. Chronic occlusion of the SVG-right PDA 4. Moderate-severe aortic stenosis with mean gradient 28 mmHg and calculated AVA 1.08 square cm  Continued evaluation for TAVR. Pt clearly has severe aortic stenosis by echo criteria and progressive clinical symptoms.     Recent Labs: 06/08/2015: ALT 27 06/13/2015: BUN 11; Creatinine, Ser 0.86; Hemoglobin 11.8; Magnesium 2.1;  Platelets 59; Potassium 4.6; Sodium 140  04/26/2015: Cholesterol 129; HDL 39; LDL Cholesterol 49; Total CHOL/HDL Ratio 3.3; Triglycerides 207; VLDL 41   CrCl cannot be calculated (Patient's most recent lab result is older than the maximum 21 days allowed.).   Wt Readings from Last 3 Encounters:  04/15/16 201 lb (91.2 kg)  03/11/16 199 lb (90.3 kg)  11/20/15 199 lb 3.2 oz (90.4 kg)     Other studies reviewed: Additional studies/records reviewed today include: summarized above  ASSESSMENT AND PLAN:  1. Tachy-brady syndrome, sinus node dysfunction w/PPM      normal device function, no changes made  2. HTN     Home BP reported generally better     He is pretty liberal with salt, counseled on salt reduction, weight loss  3. Hx of PAF is mentioned     Unknown when this occurred, unclear if he was historically on a/c He tells me he was on Plavix post TAVR and had numerous nose bleeds, they were spontaneous and reports he would wake with his CPAP mask "covered" in blood, none further off it     CHA2DS2Vasc is 4     Only 20 second episode since his last interrogation, will continue to monitor via his device     If more AF is observed will need to discuss a/c, may need ENT if we need to use  4. CAD     S/p LHC as noted above     Chronic sounding symptoms, stable CAD by cath last year  5. VHD     S/p TAVR March 2017   Disposition: F/u with Dr. Burt Knack as planned in April, he will reduce his sodium intake and make weight loss efforts, continue to monitor his BP at home, calling if his BP >160 regularly  Current medicines are reviewed at length with the patient today.  The patient did not have any concerns regarding medicines.  Haywood Lasso, PA-C 04/15/2016 5:27 PM     [address] [place] [address] [phone] (office)  [phone] (fax)

## 2016-04-15 ENCOUNTER — Ambulatory Visit (INDEPENDENT_AMBULATORY_CARE_PROVIDER_SITE_OTHER): Payer: Medicare Other | Admitting: Physician Assistant

## 2016-04-15 ENCOUNTER — Encounter (INDEPENDENT_AMBULATORY_CARE_PROVIDER_SITE_OTHER): Payer: Self-pay

## 2016-04-15 VITALS — BP 150/78 | HR 62 | Ht 66.0 in | Wt 201.0 lb

## 2016-04-15 DIAGNOSIS — I495 Sick sinus syndrome: Secondary | ICD-10-CM | POA: Diagnosis not present

## 2016-04-15 DIAGNOSIS — I2510 Atherosclerotic heart disease of native coronary artery without angina pectoris: Secondary | ICD-10-CM | POA: Diagnosis not present

## 2016-04-15 DIAGNOSIS — I350 Nonrheumatic aortic (valve) stenosis: Secondary | ICD-10-CM | POA: Diagnosis not present

## 2016-04-15 DIAGNOSIS — I10 Essential (primary) hypertension: Secondary | ICD-10-CM

## 2016-04-15 DIAGNOSIS — I480 Paroxysmal atrial fibrillation: Secondary | ICD-10-CM

## 2016-04-15 MED ORDER — METOPROLOL TARTRATE 25 MG PO TABS: 25.0000 mg | ORAL_TABLET | Freq: Two times a day (BID) | ORAL | 3 refills | Status: DC

## 2016-04-15 NOTE — Patient Instructions (Addendum)
Medication Instructions:   Your physician recommends that you continue on your current medications as directed. Please refer to the Current Medication list given to you today.   If you need a refill on your cardiac medications before your next appointment, please call your pharmacy.  Labwork:  NONE ORDERED  TODAY    Testing/Procedures: NONE ORDERED  TODAY    Follow-Up:  WITH DR Burt Knack IN April   BLOOD PRESSURE FOLLOW UP PHARMD IN ONE MONTH   Your physician wants you to follow-up in: [place] will receive a reminder letter in the mail two months in advance. If you don't receive a letter, please call our office to schedule the follow-up appointment.  Remote monitoring is used to monitor your Pacemaker of ICD from home. This monitoring reduces the number of office visits required to check your device to one time per year. It allows [place] to keep an eye on the functioning of your device to ensure it is working properly. You are scheduled for a device check from home on .07/15/2016.. You may send your transmission at any time that day. If you have a wireless device, the transmission will be sent automatically. After your physician reviews your transmission, you will receive a postcard with your next transmission date.     Any Other Special Instructions Will Be Listed Below (If Applicable).   KEEP A DAILY LOG OF BLOOD PRESSURE AND CALL IF BLOOD PRESSURE IS CONSTANLY RUNNING IN 160S ON TOP NUMBER

## 2016-05-13 NOTE — Progress Notes (Signed)
Patient ID: [name]                 DOB: 28-Feb-1935                      MRN: [id number]     HPI: [name] is a 82 y.o. male patient of Dr Sallyanne Kuster (cards), Dr [name] (EP), and Dr Radford Pax (OSA) referred by Tommye Standard, PA to HTN clinic. 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 Burt Knack. Pt seen by Dr [name] 03/11/16 and metoprolol was increased d/t NSVT on his device check. At f/u visit 04/15/16, pt reported home BP readings ranging 120-140s/80s with a few occasional systolic readings [id number]. Pt was encouraged to reduce salt intake and presents today for follow up.  Pt reports feeling well overall. Denies dizziness, blurred vision, headache, or falls. He checks his BP 1-2x per day. His readings usually range 120-140s/60-70s with HR in the mid 50s to 60. He occasionally has a systolic BP of [id number]. He will check his BP again a few minutes later and it usually drops to 140-150. This is likely an inaccurate read due to his afib and 2nd readings are more accurate.   Pt does not take his Dyazide on the weekends. Reports that his PCP started him on Monday-Friday dose years ago and never advised him to take it on the weekends. His BP readings on the weekend tend to be higher 160-170s occasionally.  Pt watches his sodium intake and limits caffeine. Walks on his treadmill most days a week.  Current HTN meds: triamterene-HCTZ 37.5-25mg  daily on Monday-Friday, metoprolol tartrate 25mg  BID BP goal: <140/77mmHg  Family History: [name] family history includes Cancer in his brother; Colon cancer in his father; Heart Problems in his brother and sister.  Social History: The patient  reports that he quit smoking about 21 years ago. His smoking use included Cigarettes. He has a 96.00 pack-year smoking history. He has never used smokeless tobacco. He reports that he  does not drink alcohol or use drugs.   Diet: Has cut back on sodium intake. Only drinks 1 cup of coffee a day.  Exercise: Has a treadmill and walks on it 3-4x per week.   Wt Readings from Last 3 Encounters:  04/15/16 201 lb (91.2 kg)  03/11/16 199 lb (90.3 kg)  11/20/15 199 lb 3.2 oz (90.4 kg)   BP Readings from Last 3 Encounters:  04/15/16 (!) 150/78  03/11/16 (!) 162/78  11/20/15 (!) 144/60   Pulse Readings from Last 3 Encounters:  04/15/16 62  03/11/16 60  11/20/15 (!) 59    Renal function: CrCl cannot be calculated (Patient's most recent lab result is older than the maximum 21 days allowed.).  Past Medical History:  Diagnosis Date  . AAA (abdominal aortic aneurysm) ([place])    a. Last duplex - 3.8cm 01/2013 - due 01/2014.  [name] Acne rosacea   . Anginal pain ([place]) 1996  . Aortic stenosis 05/07/2012  . Aortic stenosis, mild    Last echo 05/04/12 +LVH  . Arthritis    shoulders  . Back pain    hx epidural injections  . Baker's cyst    Lt.  [name] CAD (coronary artery disease)    a. s/p MI/PTCA - Cx 1994. b. s/p CABG x5 in 1997. c. Abnl nuc 2003- occ SVG-RCA,  patent seq SVG-OM1-dCxOM, patentl LIMA-LAD, patent, SVG-diag. d. Normal nuc 09/2012.  . Cancer ([place])   . Carotid artery disease ([place])    a. Duplex 01/2013: mildly abnormal. Mild plaque without significant diameter reduction. Repeat recommended 11/15.  [name] COPD (chronic obstructive pulmonary disease) ([place])   . Dizziness and giddiness 08/04/2013  . GERD (gastroesophageal reflux disease)   . H/O prostate cancer    a. s/p radical retropubic prostatectomy with bilateral pelvic lymph node dissection  . H/O: GI bleed    mild, neg. colonoscopy  . HOH (hard of hearing)   . HTN (hypertension)   . Hyperlipidemia   . Hypertensive heart disease   . OSA on CPAP   . OSA treated with BiPAP 11/20/2015   Severe with [facility] 38/hr  . Pacemaker -[place] with change out 2006  . PAF (paroxysmal atrial fibrillation) ([place])    a.  Remote per records.   . Pneumonia    history  . PVD (peripheral vascular disease) ([place])    a. Rt ext iliac stenosis, last PV cath 2003, moderate stenosis last Lower Ext Dopplers 12/16/11 - Rt. ABI 0.96  Lt ABI 1.0.  . Sick sinus syndrome ([place])    a. placed 1999, gen change 2011 - St. Jude.  . Sinus node dysfunction ([place]) 01/20/2013  . Syncope 11/28/97  . Thrombocytopenia ([facility])    chronic, mild  . Valvular heart disease    a. Echo 04/2012: mild-mod AS, mild AI, mild MR.    Current Outpatient Prescriptions on File Prior to Visit  Medication Sig Dispense Refill  . aspirin 81 MG tablet Take 81 mg by mouth daily.    . metoprolol tartrate (LOPRESSOR) 25 MG tablet Take 1 tablet (25 mg total) by mouth 2 (two) times daily. 180 tablet 3  . nitroGLYCERIN (NITROSTAT) 0.4 MG SL tablet Place 1 tablet (0.4 mg total) under the tongue every 5 (five) minutes as needed for chest pain. 25 tablet 3  . Omega-3 Fatty Acids (FISH OIL) 1200 MG CAPS Take 1,200 mg by mouth every evening.    . ranitidine (ZANTAC) 150 MG tablet Take 150 mg by mouth 2 (two) times daily.     . simvastatin (ZOCOR) 80 MG tablet TAKE 1 TABLET BY MOUTH AT BEDTIME 90 tablet 2  . triamterene-hydrochlorothiazide (DYAZIDE) 37.5-25 MG capsule Take 1 capsule by mouth daily - Monday through Friday 90 capsule 3  . vitamin C (ASCORBIC ACID) 500 MG tablet Take 500 mg by mouth every evening.     No current facility-administered medications on file prior to visit.     No Known Allergies   Assessment/Plan:  1. Hypertension - BP close to goal <140/29mmHg in clinic given age and most readings at home are at goal. Has occasional spikes in BP at home that are at goal on recheck a few minutes later- discussed that first reading is likely inaccurate and his afib may be affecting the cuff. He will trust the second reading. Will increase Dyazide to 1 tablet daily since pt was not taking this on the weekends per his PCP years ago and his weekend readings are  trending higher off the med. Will f/u with Dr Burt Knack in 2 months as scheduled. Advised pt to call clinic prior to this with any concerns.   Megan E. Supple, PharmD, CPP, [place] [id number] [place], [place] [address] Phone: [phone]; Fax: [phone] 05/14/2016 10:26 AM

## 2016-05-14 ENCOUNTER — Ambulatory Visit (INDEPENDENT_AMBULATORY_CARE_PROVIDER_SITE_OTHER): Payer: Medicare Other | Admitting: Pharmacist

## 2016-05-14 VITALS — BP 148/64 | HR 59

## 2016-05-14 DIAGNOSIS — I10 Essential (primary) hypertension: Secondary | ICD-10-CM | POA: Diagnosis not present

## 2016-05-14 NOTE — Patient Instructions (Addendum)
Increase Dyazide to 1 tablet every day (including the weekends)  Follow up with Dr Burt Knack in 2 months as scheduled  Call if you have any questions before then

## 2016-06-19 ENCOUNTER — Other Ambulatory Visit: Payer: Self-pay | Admitting: Internal Medicine

## 2016-06-25 ENCOUNTER — Encounter: Payer: Self-pay | Admitting: Cardiovascular Disease

## 2016-07-07 ENCOUNTER — Ambulatory Visit (HOSPITAL_COMMUNITY): Payer: Medicare Other | Attending: Cardiology

## 2016-07-07 ENCOUNTER — Other Ambulatory Visit: Payer: Self-pay

## 2016-07-07 DIAGNOSIS — I340 Nonrheumatic mitral (valve) insufficiency: Secondary | ICD-10-CM | POA: Diagnosis not present

## 2016-07-07 DIAGNOSIS — I350 Nonrheumatic aortic (valve) stenosis: Secondary | ICD-10-CM | POA: Diagnosis not present

## 2016-07-07 DIAGNOSIS — Z952 Presence of prosthetic heart valve: Secondary | ICD-10-CM | POA: Insufficient documentation

## 2016-07-14 ENCOUNTER — Encounter: Payer: Self-pay | Admitting: Cardiovascular Disease

## 2016-07-14 ENCOUNTER — Encounter (INDEPENDENT_AMBULATORY_CARE_PROVIDER_SITE_OTHER): Payer: Self-pay

## 2016-07-14 ENCOUNTER — Ambulatory Visit (INDEPENDENT_AMBULATORY_CARE_PROVIDER_SITE_OTHER): Payer: Medicare Other | Admitting: Cardiovascular Disease

## 2016-07-14 VITALS — BP 136/58 | HR 72 | Ht 65.0 in | Wt 208.2 lb

## 2016-07-14 DIAGNOSIS — Z952 Presence of prosthetic heart valve: Secondary | ICD-10-CM

## 2016-07-14 DIAGNOSIS — I350 Nonrheumatic aortic (valve) stenosis: Secondary | ICD-10-CM | POA: Diagnosis not present

## 2016-07-14 NOTE — Patient Instructions (Addendum)
Medication Instructions:  Your physician recommends that you continue on your current medications as directed. Please refer to the Current Medication list given to you today.  Labwork: No new orders.   Testing/Procedures: No new orders.   Follow-Up: Your physician wants you to follow-up in: 1 YEAR with Dr Sallyanne Kuster.  You will receive a reminder letter in the mail two months in advance. If you don't receive a letter, please call our office to schedule the follow-up appointment.   Any Other Special Instructions Will Be Listed Below (If Applicable).  Your physician discussed the importance of taking an antibiotic prior to any dental, gastrointestinal, genitourinary procedures to prevent damage to the heart valves from infection.   If you need a refill on your cardiac medications before your next appointment, please call your pharmacy.

## 2016-07-14 NOTE — Progress Notes (Signed)
Cardiology Office Note Date:  07/16/2016   ID:  [name], DOB [birth year], MRN [id number]  PCP:  [name], MD  Cardiologist:  Dr Sallyanne Kuster  Chief Complaint  Patient presents with  . Aortic Stenosis    s/p TAVR     History of Present Illness: [name] is a 82 y.o. male who presents for one year TAVR follow-up. He developed progressive aortic stenosis on a background of CAD with remote CABG and paroxysmal atrial fibrillation. He underwent TAVR via a percutaneous left transfemoral approach using a 29 mm Edwards Sapien 3 valve 7/34/2876 with an uncomplicated post-operative course.  The patient is here alone today. He is doing fairly well. He does admit to shortness of breath when walking back from his mailbox which involves coming up a hill. He can walk on level ground without problems. Breathing is clearly improved from last year before he underwent TAVR. He denies chest pain or pressure, leg swelling, orthopnea, PND, or heart palpitations.  Past Medical History:  Diagnosis Date  . AAA (abdominal aortic aneurysm) ([place])    a. Last duplex - 3.8cm 01/2013 - due 01/2014.  [name] Acne rosacea   . Anginal pain ([place]) 1996  . Aortic stenosis 05/07/2012  . Aortic stenosis, mild    Last echo 05/04/12 +LVH  . Arthritis    shoulders  . Back pain    hx epidural injections  . Baker's cyst    Lt.  [name] CAD (coronary artery disease)    a. s/p MI/PTCA - Cx 1994. b. s/p CABG x5 in 1997. c. Abnl nuc 2003- occ SVG-RCA, patent seq SVG-OM1-dCxOM, patentl LIMA-LAD, patent, SVG-diag. d. Normal nuc 09/2012.  . Cancer ([place])   . Carotid artery disease ([place])    a. Duplex 01/2013: mildly abnormal. Mild plaque without significant diameter reduction. Repeat recommended 11/15.  [name] COPD (chronic obstructive pulmonary disease) ([place])   . Dizziness and giddiness 08/04/2013  . GERD (gastroesophageal reflux disease)   . H/O prostate cancer    a. s/p radical retropubic prostatectomy with bilateral pelvic  lymph node dissection  . H/O: GI bleed    mild, neg. colonoscopy  . HOH (hard of hearing)   . HTN (hypertension)   . Hyperlipidemia   . Hypertensive heart disease   . OSA on CPAP   . OSA treated with BiPAP 11/20/2015   Severe with [facility] 38/hr  . Pacemaker -[place] with change out 2006  . PAF (paroxysmal atrial fibrillation) ([place])    a. Remote per records.   . Pneumonia    history  . PVD (peripheral vascular disease) ([place])    a. Rt ext iliac stenosis, last PV cath 2003, moderate stenosis last Lower Ext Dopplers 12/16/11 - Rt. ABI 0.96  Lt ABI 1.0.  . Sick sinus syndrome ([place])    a. placed 1999, gen change 2011 - St. Jude.  . Sinus node dysfunction ([place]) 01/20/2013  . Syncope 11/28/97  . Thrombocytopenia ([facility])    chronic, mild  . Valvular heart disease    a. Echo 04/2012: mild-mod AS, mild AI, mild MR.    Past Surgical History:  Procedure Laterality Date  . CARDIAC CATHETERIZATION  2003   VG to RCA occluded, other grafts patent  . CARDIAC CATHETERIZATION N/A 05/04/2015   Procedure: Right/Left Heart Cath and Coronary/Graft Angiography;  Surgeon: Sherren Mocha, MD;  Location: [facility];  Service: Cardiovascular;  Laterality: N/A;  . CATARACT EXTRACTION Bilateral    with lens  .  cataracts    . COLONOSCOPY    . CORONARY ANGIOPLASTY  03/29/92   PTCA to LCX  . CORONARY ARTERY BYPASS GRAFT  1997   LIMA-LAD; VG-diag; seq VG-1st OM & distal LCX; VG-RCA  . PACEMAKER GENERATOR CHANGE  08/15/2009   St. Jude accent  . PACEMAKER INSERTION  11/28/97   pacesetter--ERI 2011  . SUPRAPUBIC PROSTATECTOMY    . TEE WITHOUT CARDIOVERSION N/A 06/12/2015   Procedure: TRANSESOPHAGEAL ECHOCARDIOGRAM (TEE);  Surgeon: Sherren Mocha, MD;  Location: [facility];  Service: Open Heart Surgery;  Laterality: N/A;  . TRANSCATHETER AORTIC VALVE REPLACEMENT, TRANSFEMORAL N/A 06/12/2015   Procedure: TRANSCATHETER AORTIC VALVE REPLACEMENT, TRANSFEMORAL, possible transpical;  Surgeon: Sherren Mocha, MD;   Location: [facility];  Service: Open Heart Surgery;  Laterality: N/A;    Current Outpatient Prescriptions  Medication Sig Dispense Refill  . aspirin 81 MG tablet Take 81 mg by mouth daily.    . metoprolol tartrate (LOPRESSOR) 25 MG tablet Take 1 tablet (25 mg total) by mouth 2 (two) times daily. 180 tablet 3  . nitroGLYCERIN (NITROSTAT) 0.4 MG SL tablet Place 0.4 mg under the tongue every 5 (five) minutes as needed for chest pain.    . Omega-3 Fatty Acids (FISH OIL) 1200 MG CAPS Take 1,200 mg by mouth every evening.    . ranitidine (ZANTAC) 150 MG tablet Take 150 mg by mouth 2 (two) times daily.     . simvastatin (ZOCOR) 80 MG tablet TAKE ONE (1) TABLET BY MOUTH AT BEDTIME 90 tablet 1  . triamterene-hydrochlorothiazide (DYAZIDE) 37.5-25 MG capsule Take 1 each (1 capsule total) by mouth daily.    . vitamin C (ASCORBIC ACID) 500 MG tablet Take 500 mg by mouth every evening.     No current facility-administered medications for this visit.     Allergies:   Patient has no known allergies.   Social History:  The patient  reports that he quit smoking about 21 years ago. His smoking use included Cigarettes. He has a 96.00 pack-year smoking history. He has never used smokeless tobacco. He reports that he does not drink alcohol or use drugs.   Family History:  [name]  family history includes Cancer in his brother; Colon cancer in his father; Heart Problems in his brother and sister.    ROS:  Please see the history of present illness.   All other systems are reviewed and negative.    PHYSICAL EXAM: VS:  BP (!) 136/58   Pulse 72   Ht 5\' 5"  (1.651 m)   Wt 208 lb 3.2 oz (94.4 kg)   BMI 34.65 kg/m  , BMI Body mass index is 34.65 kg/m. GEN: Well nourished, well developed, in no acute distress  HEENT: normal  Neck: no JVD, no masses.  Cardiac: RRR without murmur or gallop                Respiratory:  clear to auscultation bilaterally, normal work of breathing GI: soft, nontender,  nondistended, + BS MS: no deformity or atrophy  Ext: no pretibial edema, pedal pulses 2+= bilaterally Skin: warm and dry, no rash Neuro:  Strength and sensation are intact Psych: euthymic mood, full affect  EKG:  EKG is not ordered today.  Recent Labs: No results found for requested labs within last 8760 hours.   Lipid Panel     Component Value Date/Time   CHOL 129 04/26/2015 1620   TRIG 207 (H) 04/26/2015 1620   HDL 39 (L) 04/26/2015 1620  CHOLHDL 3.3 04/26/2015 1620   VLDL 41 (H) 04/26/2015 1620   LDLCALC 49 04/26/2015 1620      Wt Readings from Last 3 Encounters:  07/14/16 208 lb 3.2 oz (94.4 kg)  04/15/16 201 lb (91.2 kg)  03/11/16 199 lb (90.3 kg)     Cardiac Studies Reviewed: 2D Echo 07/07/2016: Study Conclusions  - Left ventricle: The cavity size was normal. Wall thickness was   normal. Systolic function was normal. The estimated ejection   fraction was in the range of 55% to 60%. Wall motion was normal;   there were no regional wall motion abnormalities. Septal bounce   consistent with RV pacing. Doppler parameters are consistent with   abnormal left ventricular relaxation (grade 1 diastolic   dysfunction). - Aortic valve: Bioprosthetic aortic valve, s/p TAVR. No   significant valvular stenosis. There was no significant   regurgitation. Mean gradient (S): 17 mm Hg. Valve area (VTI):   1.49 cm^2. - Aorta: Ascending aortic diameter: 38 mm (S). - Ascending aorta: The ascending aorta was mildly dilated. - Mitral valve: Mildly calcified annulus. There was trivial   regurgitation. - Left atrium: The atrium was mildly dilated. - Right ventricle: The cavity size was normal. Pacer wire or   catheter noted in right ventricle. Systolic function was normal. - Tricuspid valve: Peak RV-RA gradient (S): 35 mm Hg. - Pulmonary arteries: PA peak pressure: 38 mm Hg (S). - Inferior vena cava: The vessel was normal in size. The   respirophasic diameter changes were in the  normal range (>= 50%),   consistent with normal central venous pressure.  Impressions:  - Normal LV size with EF 55-60%. Septal bounce consistent with RV   pacing. Normal RV size and systolic function. Bioprosthetic   aortic valve s/p TAVR, the valve was functioning normally. Mild   pulmonary hypertension.   ASSESSMENT AND PLAN: Aortic valve disease now one year removed from TAVR. He has NYHA 2 symptoms of chronic diastolic heart failure. Echo images are reviewed and demonstrate normal LV function, mean transaortic gradient of 17 mmHg, and no paravalvular regurgitation. SBE prophylaxis guidelines are reviewed with the patient today. Will ask him to see Dr Sallyanne Kuster back next year for his scheduled cardiology follow-up.   Current medicines are reviewed with the patient today.  The patient does not have concerns regarding medicines.  Labs/ tests ordered today include:  No orders of the defined types were placed in this encounter.  Disposition:   FU Dr Sallyanne Kuster and Dr [name]. I would be happy to see back in the future if problems arise.   Deatra James, MD  07/16/2016 10:22 PM    [facility] [place], [place], [place]  [address] Phone: [phone]; Fax: [phone]

## 2016-07-17 NOTE — Progress Notes (Signed)
Thanks, MIke! [name]

## 2016-11-20 ENCOUNTER — Encounter: Payer: Self-pay | Admitting: Cardiology

## 2016-11-20 ENCOUNTER — Ambulatory Visit (INDEPENDENT_AMBULATORY_CARE_PROVIDER_SITE_OTHER): Payer: Medicare Other | Admitting: Cardiology

## 2016-11-20 VITALS — BP 143/61 | HR 63 | Ht 65.0 in | Wt 202.1 lb

## 2016-11-20 DIAGNOSIS — G4733 Obstructive sleep apnea (adult) (pediatric): Secondary | ICD-10-CM

## 2016-11-20 DIAGNOSIS — I119 Hypertensive heart disease without heart failure: Secondary | ICD-10-CM | POA: Diagnosis not present

## 2016-11-20 DIAGNOSIS — E669 Obesity, unspecified: Secondary | ICD-10-CM

## 2016-11-20 NOTE — Progress Notes (Signed)
Cardiology Office Note:    Date:  11/20/2016   ID:  [name], DOB 1934-12-17, MRN [id number]  PCP:  [name], MD  Cardiologist:  Fransico Him, MD   Referring MD: [name], *   Chief Complaint  Patient presents with  . Sleep Apnea  . Hypertension    History of Present Illness:    [name] is a 82 y.o. male with a hx of severe OSA with an AHI of 38/hr and is on  BiPAP at 19/15cm H2O.  He tolerates his BiPAP without any problems.  He continues to feel rested in the am with no significant daytime sleepiness although once in a while he will take a nap if watching TV.  He continues to do well with his full mask but is having problems with leakage and has to tighten it so much that it is irritating the bridge of his nose and his face. He feels the pressure is adequate but want to know if the pressure can be lowered due to mask leakage. He was seen by his DME who recommended that he be switched from a full face mask to a nasal pillow mask with chin strap and lower the BiPAP pressure.  He denies any significant mouth or nasal dryness but has a lot of nasal drainage in the am.  He walks for exercise on the treadmill and outside.   Past Medical History:  Diagnosis Date  . AAA (abdominal aortic aneurysm) ([place])    a. Last duplex - 3.8cm 01/2013 - due 01/2014.  [name] Acne rosacea   . Anginal pain ([place]) 1996  . Aortic stenosis, mild    Last echo 05/04/12 +LVH  . Arthritis    shoulders  . Back pain    hx epidural injections  . Baker's cyst    Lt.  [name] CAD (coronary artery disease)    a. s/p MI/PTCA - Cx 1994. b. s/p CABG x5 in 1997. c. Abnl nuc 2003- occ SVG-RCA, patent seq SVG-OM1-dCxOM, patentl LIMA-LAD, patent, SVG-diag. d. Normal nuc 09/2012.  . Cancer ([place])   . Carotid artery disease ([place])    a. Duplex 01/2013: mildly abnormal. Mild plaque without significant diameter reduction. Repeat recommended 11/15.  [name] COPD (chronic obstructive pulmonary disease) ([place])   .  Dizziness and giddiness 08/04/2013  . GERD (gastroesophageal reflux disease)   . H/O prostate cancer    a. s/p radical retropubic prostatectomy with bilateral pelvic lymph node dissection  . H/O: GI bleed    mild, neg. colonoscopy  . HOH (hard of hearing)   . HTN (hypertension)   . Hyperlipidemia   . Hypertensive heart disease   . OSA treated with BiPAP 11/20/2015   Severe with [facility] 38/hr  . Pacemaker -[place] with change out 2006  . PAF (paroxysmal atrial fibrillation) ([place])    a. Remote per records.   . Pneumonia    history  . PVD (peripheral vascular disease) ([place])    a. Rt ext iliac stenosis, last PV cath 2003, moderate stenosis last Lower Ext Dopplers 12/16/11 - Rt. ABI 0.96  Lt ABI 1.0.  . Sick sinus syndrome ([place])    a. placed 1999, gen change 2011 - St. Jude.  . Sinus node dysfunction ([place]) 01/20/2013  . Syncope 11/28/97  . Thrombocytopenia (HCC)    chronic, mild  . Valvular heart disease    a. Echo 04/2012: mild-mod AS, mild AI, mild MR.    Past Surgical History:  Procedure Laterality Date  . CARDIAC CATHETERIZATION  2003   VG to RCA occluded, other grafts patent  . CARDIAC CATHETERIZATION N/A 05/04/2015   Procedure: Right/Left Heart Cath and Coronary/Graft Angiography;  Surgeon: Sherren Mocha, MD;  Location: [place] [facility];  Service: Cardiovascular;  Laterality: N/A;  . CATARACT EXTRACTION Bilateral    with lens  . cataracts    . COLONOSCOPY    . CORONARY ANGIOPLASTY  03/29/92   PTCA to LCX  . CORONARY ARTERY BYPASS GRAFT  1997   LIMA-LAD; VG-diag; seq VG-1st OM & distal LCX; VG-RCA  . PACEMAKER GENERATOR CHANGE  08/15/2009   St. Jude accent  . PACEMAKER INSERTION  11/28/97   pacesetter--ERI 2011  . SUPRAPUBIC PROSTATECTOMY    . TEE WITHOUT CARDIOVERSION N/A 06/12/2015   Procedure: TRANSESOPHAGEAL ECHOCARDIOGRAM (TEE);  Surgeon: Sherren Mocha, MD;  Location: [place];  Service: Open Heart Surgery;  Laterality: N/A;  . TRANSCATHETER AORTIC VALVE  REPLACEMENT, TRANSFEMORAL N/A 06/12/2015   Procedure: TRANSCATHETER AORTIC VALVE REPLACEMENT, TRANSFEMORAL, possible transpical;  Surgeon: Sherren Mocha, MD;  Location: [place];  Service: Open Heart Surgery;  Laterality: N/A;    Current Medications: Current Meds  Medication Sig  . aspirin 81 MG tablet Take 81 mg by mouth daily.  . metoprolol tartrate (LOPRESSOR) 25 MG tablet Take 1 tablet (25 mg total) by mouth 2 (two) times daily.  . nitroGLYCERIN (NITROSTAT) 0.4 MG SL tablet Place 0.4 mg under the tongue every 5 (five) minutes as needed for chest pain.  . Omega-3 Fatty Acids (FISH OIL) 1200 MG CAPS Take 1,200 mg by mouth every evening.  . ranitidine (ZANTAC) 150 MG tablet Take 150 mg by mouth 2 (two) times daily.   . simvastatin (ZOCOR) 80 MG tablet TAKE ONE (1) TABLET BY MOUTH AT BEDTIME  . triamterene-hydrochlorothiazide (DYAZIDE) 37.5-25 MG capsule Take 1 each (1 capsule total) by mouth daily.  . vitamin C (ASCORBIC ACID) 500 MG tablet Take 500 mg by mouth every evening.     Allergies:   Patient has no known allergies.   Social History   Social History  . Marital status: Widowed    Spouse name: N/A  . Number of children: 5  . Years of education: 7th   Occupational History  . Retired    Social History Main Topics  . Smoking status: Former Smoker    Packs/day: 2.00    Years: 48.00    Types: Cigarettes    Quit date: 03/18/1995  . Smokeless tobacco: Never Used  . Alcohol use No     Comment: occ  . Drug use: No  . Sexual activity: Not Asked   Other Topics Concern  . None   Social History Narrative  . None     Family History: [name] family history includes CAD in his unknown relative; Cancer in his brother; Colon cancer in his father; Heart Problems in his brother and sister.  ROS:   Please see the history of present illness.     All other systems reviewed and are negative.  EKGs/Labs/Other Studies Reviewed:    The following studies were reviewed  today: CPAP download  EKG:  EKG is  ordered today and showed NSR with LBBB  Recent Labs: No results found for requested labs within last 8760 hours.   Recent Lipid Panel    Component Value Date/Time   CHOL 129 04/26/2015 1620   TRIG 207 (H) 04/26/2015 1620   HDL 39 (L) 04/26/2015 1620   CHOLHDL 3.3  04/26/2015 1620   VLDL 41 (H) 04/26/2015 1620   LDLCALC 49 04/26/2015 1620    Physical Exam:    VS:  BP (!) 143/61   Pulse 63   Ht 5\' 5"  (1.651 m)   Wt 202 lb 1.9 oz (91.7 kg)   SpO2 94%   BMI 33.63 kg/m     Wt Readings from Last 3 Encounters:  11/20/16 202 lb 1.9 oz (91.7 kg)  07/14/16 208 lb 3.2 oz (94.4 kg)  04/15/16 201 lb (91.2 kg)     GEN:  Well nourished, well developed in no acute distress HEENT: Normal NECK: No JVD; No carotid bruits LYMPHATICS: No lymphadenopathy CARDIAC: RRR, no murmurs, rubs, gallops RESPIRATORY:  Clear to auscultation without rales, wheezing or rhonchi  ABDOMEN: Soft, non-tender, non-distended MUSCULOSKELETAL:  No edema; No deformity  SKIN: Warm and dry NEUROLOGIC:  Alert and oriented x 3 PSYCHIATRIC:  Normal affect   ASSESSMENT:    1. OSA treated with BiPAP   2. Hypertensive heart disease without heart failure   3. Obesity (BMI 30-39.9)    PLAN:    In order of problems listed above:  OSA - the patient is tolerating PAP therapy well without any problems. The PAP download was reviewed today and showed an AHI of 1.2/hr on 18/14 cm H2O with 98% compliance in using more than 4 hours nightly.  The patient has been using and benefiting from CPAP use and will continue to benefit from therapy. He is having problems with his full face mask leaking.  His AHI is very low so I will decrease his BIPAP to 16/12cm H2O and switch to a ResMed Airfit P20 mask with chin strap and get a download  In 2 weeks.   2.   HTN - BP is well controlled on exam today.  He will continue on Dyazide and metoprolol.  3.   Obesity - I have encouraged him to get into a  routine exercise program and cut back on carbs and portions.    Medication Adjustments/Labs and Tests Ordered: Current medicines are reviewed at length with the patient today.  Concerns regarding medicines are outlined above.  Orders Placed This Encounter  Procedures  . For home use only DME Bipap  . EKG 12-Lead   No orders of the defined types were placed in this encounter.   Signed, Fransico Him, MD  11/20/2016 9:59 AM    [facility]

## 2016-11-20 NOTE — Patient Instructions (Signed)
Medication Instructions:  Your provider recommends that you continue on your current medications as directed. Please refer to the Current Medication list given to you today.    Labwork: None  Testing/Procedures: None  Follow-Up: Your provider wants you to follow-up in: 1 year with Dr. Radford Pax. You will receive a reminder letter in the mail two months in advance. If you don't receive a letter, please call our office to schedule the follow-up appointment.    Any Other Special Instructions Will Be Listed Below (If Applicable). Dr. Radford Pax has ordered for you a new mask for your PAP. She has also ordered to decrease your settings to 16/12 cm H2O.    If you need a refill on your cardiac medications before your next appointment, please call your pharmacy.

## 2016-12-08 ENCOUNTER — Other Ambulatory Visit: Payer: Self-pay | Admitting: Internal Medicine

## 2017-01-23 ENCOUNTER — Other Ambulatory Visit: Payer: Self-pay | Admitting: Cardiovascular Disease

## 2017-01-23 ENCOUNTER — Other Ambulatory Visit (HOSPITAL_COMMUNITY): Payer: Self-pay | Admitting: Family Medicine

## 2017-01-23 DIAGNOSIS — I714 Abdominal aortic aneurysm, without rupture, unspecified: Secondary | ICD-10-CM

## 2017-02-11 ENCOUNTER — Ambulatory Visit (HOSPITAL_COMMUNITY)
Admission: RE | Admit: 2017-02-11 | Discharge: 2017-02-11 | Disposition: A | Discharge status: Home / Self Care | Payer: Medicare Other | Source: Ambulatory Visit | Attending: Cardiology | Admitting: Cardiology

## 2017-02-11 DIAGNOSIS — I700 Atherosclerosis of aorta: Secondary | ICD-10-CM | POA: Diagnosis not present

## 2017-02-11 DIAGNOSIS — I714 Abdominal aortic aneurysm, without rupture, unspecified: Secondary | ICD-10-CM

## 2017-02-11 DIAGNOSIS — I77819 Aortic ectasia, unspecified site: Secondary | ICD-10-CM | POA: Diagnosis not present

## 2017-02-11 DIAGNOSIS — I708 Atherosclerosis of other arteries: Secondary | ICD-10-CM | POA: Diagnosis not present

## 2017-02-13 ENCOUNTER — Other Ambulatory Visit: Payer: Self-pay | Admitting: *Deleted

## 2017-02-13 DIAGNOSIS — I714 Abdominal aortic aneurysm, without rupture, unspecified: Secondary | ICD-10-CM

## 2017-02-25 ENCOUNTER — Encounter: Payer: Self-pay | Admitting: Internal Medicine

## 2017-03-03 ENCOUNTER — Encounter: Payer: Self-pay | Admitting: Internal Medicine

## 2017-03-03 ENCOUNTER — Ambulatory Visit (INDEPENDENT_AMBULATORY_CARE_PROVIDER_SITE_OTHER): Payer: Medicare Other | Admitting: Internal Medicine

## 2017-03-03 VITALS — BP 132/75 | HR 69 | Ht 65.0 in | Wt 204.0 lb

## 2017-03-03 DIAGNOSIS — I495 Sick sinus syndrome: Secondary | ICD-10-CM | POA: Diagnosis not present

## 2017-03-03 DIAGNOSIS — Z950 Presence of cardiac pacemaker: Secondary | ICD-10-CM

## 2017-03-03 DIAGNOSIS — I480 Paroxysmal atrial fibrillation: Secondary | ICD-10-CM

## 2017-03-03 LAB — CUP PACEART INCLINIC DEVICE CHECK
Battery Voltage: 2.9 V
Brady Statistic RA Percent Paced: 20 %
Date Time Interrogation Session: 20181218152818
Implantable Lead Implant Date: 19990914
Implantable Lead Location: 753859
Lead Channel Pacing Threshold Amplitude: 1.75 V
Lead Channel Pacing Threshold Pulse Width: 0.4 ms
Lead Channel Sensing Intrinsic Amplitude: 12 mV
Lead Channel Setting Pacing Amplitude: 2 V
Lead Channel Setting Pacing Pulse Width: 0.7 ms
MDC IDC LEAD IMPLANT DT: 19990914
MDC IDC LEAD LOCATION: 753860
MDC IDC MSMT BATTERY REMAINING LONGEVITY: 93 mo
MDC IDC MSMT LEADCHNL RA IMPEDANCE VALUE: 362.5 Ohm
MDC IDC MSMT LEADCHNL RA PACING THRESHOLD AMPLITUDE: 0.5 V
MDC IDC MSMT LEADCHNL RA PACING THRESHOLD AMPLITUDE: 0.5 V
MDC IDC MSMT LEADCHNL RA PACING THRESHOLD PULSEWIDTH: 0.4 ms
MDC IDC MSMT LEADCHNL RA SENSING INTR AMPL: 0.4 mV
MDC IDC MSMT LEADCHNL RV IMPEDANCE VALUE: 550 Ohm
MDC IDC MSMT LEADCHNL RV PACING THRESHOLD AMPLITUDE: 1.75 V
MDC IDC MSMT LEADCHNL RV PACING THRESHOLD PULSEWIDTH: 0.7 ms
MDC IDC MSMT LEADCHNL RV PACING THRESHOLD PULSEWIDTH: 0.7 ms
MDC IDC PG IMPLANT DT: 20110601
MDC IDC SET LEADCHNL RV PACING AMPLITUDE: 2.125
MDC IDC SET LEADCHNL RV SENSING SENSITIVITY: 2 mV
MDC IDC STAT BRADY RV PERCENT PACED: 0.24 %
Pulse Gen Model: 2210
Pulse Gen Serial Number: 7130714

## 2017-03-03 NOTE — Patient Instructions (Addendum)
Medication Instructions: Your physician recommends that you continue on your current medications as directed. Please refer to the Current Medication list given to you today.  Labwork: Your physician has recommended that you have lab work today: BMET  Procedures/Testing: None Ordered  Follow-Up: Your physician recommends that you schedule a follow-up appointment in April 2019 With Dr. Burt Knack.  Remote monitoring is used to monitor your Pacemaker from home. This monitoring reduces the number of office visits required to check your device to one time per year. It allows [place] to keep an eye on the functioning of your device to ensure it is working properly. You are scheduled for a device check from home on 06/02/17. You may send your transmission at any time that day. If you have a wireless device, the transmission will be sent automatically. After your physician reviews your transmission, you will receive a postcard with your next transmission date.  Your physician wants you to follow-up in: 1 YEAR with Marinus Maw, PA-C.  You will receive a reminder letter in the mail two months in advance. If you don't receive a letter, please call our office to schedule the follow-up appointment.    If you need a refill on your cardiac medications before your next appointment, please call your pharmacy.

## 2017-03-03 NOTE — Progress Notes (Signed)
Patient Care Team: [name], MD as PCP - General (Family Medicine) Deboraha Sprang, MD as Consulting Physician (Cardiology)   HPI  [name] is a 82 y.o. male Seen in follow-up for pacemaker implanted remotely with generator replacement 2011. 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  He has a history of coronary artery disease with prior bypass grafting Myoview scanning 7/14 demonstrated EF 56% and no ischemia     Had prog SOB>>1/17  EF normal  Severe AS >>> TAVR with Edwards-Sapien 2/17   followup-- Duplex AA 3.7 cm but perhaps worsening of iliac disease Results and note reviewed    Denies shortness of breath  No edema he has had episodes of chest pain that last for 8-10 hours at a time occurring every few months.  It is without radiation and without accompanying epiphenomena is aggravated by motion of his left arm Date      4/15    Cr  0.93      K   4.8     3/17    Cr  0.86      K   4.6      Past Medical History:  Diagnosis Date  . AAA (abdominal aortic aneurysm) ([place])    a. Last duplex - 3.8cm 01/2013 - due 01/2014.  [name] Acne rosacea   . Anginal pain ([place]) 1996  . Aortic stenosis, mild    Last echo 05/04/12 +LVH  . Arthritis    shoulders  . Back pain    hx epidural injections  . Baker's cyst    Lt.  [name] CAD (coronary artery disease)    a. s/p MI/PTCA - Cx 1994. b. s/p CABG x5 in 1997. c. Abnl nuc 2003- occ SVG-RCA, patent seq SVG-OM1-dCxOM, patentl LIMA-LAD, patent, SVG-diag. d. Normal nuc 09/2012.  . Cancer ([place])   . Carotid artery disease ([place])    a. Duplex 01/2013: mildly abnormal. Mild plaque without significant diameter reduction. Repeat recommended 11/15.  [name] COPD (chronic obstructive pulmonary disease) ([place])   . Dizziness and giddiness 08/04/2013  . GERD (gastroesophageal reflux disease)   . H/O prostate cancer    a. s/p radical retropubic prostatectomy with bilateral pelvic lymph node dissection   . H/O: GI bleed    mild, neg. colonoscopy  . HOH (hard of hearing)   . HTN (hypertension)   . Hyperlipidemia   . Hypertensive heart disease   . OSA treated with BiPAP 11/20/2015   Severe with [facility] 38/hr  . Pacemaker -[place] with change out 2006  . PAF (paroxysmal atrial fibrillation) ([place])    a. Remote per records.   . Pneumonia    history  . PVD (peripheral vascular disease) ([place])    a. Rt ext iliac stenosis, last PV cath 2003, moderate stenosis last Lower Ext Dopplers 12/16/11 - Rt. ABI 0.96  Lt ABI 1.0.  . Sick sinus syndrome ([place])    a. placed 1999, gen change 2011 - St. Jude.  . Sinus node dysfunction ([place]) 01/20/2013  . Syncope 11/28/97  . Thrombocytopenia (HCC)    chronic, mild  . Valvular heart disease    a. Echo 04/2012: mild-mod AS, mild AI, mild MR.    Past Surgical History:  Procedure Laterality Date  . CARDIAC CATHETERIZATION  2003   VG to RCA occluded, other grafts patent  . CARDIAC CATHETERIZATION N/A 05/04/2015   Procedure: Right/Left Heart Cath and Coronary/Graft Angiography;  Surgeon:  Sherren Mocha, MD;  Location: [place] [facility];  Service: Cardiovascular;  Laterality: N/A;  . CATARACT EXTRACTION Bilateral    with lens  . cataracts    . COLONOSCOPY    . CORONARY ANGIOPLASTY  03/29/92   PTCA to LCX  . CORONARY ARTERY BYPASS GRAFT  1997   LIMA-LAD; VG-diag; seq VG-1st OM & distal LCX; VG-RCA  . PACEMAKER GENERATOR CHANGE  08/15/2009   St. Jude accent  . PACEMAKER INSERTION  11/28/97   pacesetter--ERI 2011  . SUPRAPUBIC PROSTATECTOMY    . TEE WITHOUT CARDIOVERSION N/A 06/12/2015   Procedure: TRANSESOPHAGEAL ECHOCARDIOGRAM (TEE);  Surgeon: Sherren Mocha, MD;  Location: [place];  Service: Open Heart Surgery;  Laterality: N/A;  . TRANSCATHETER AORTIC VALVE REPLACEMENT, TRANSFEMORAL N/A 06/12/2015   Procedure: TRANSCATHETER AORTIC VALVE REPLACEMENT, TRANSFEMORAL, possible transpical;  Surgeon: Sherren Mocha, MD;  Location: [place];  Service: Open Heart  Surgery;  Laterality: N/A;    Current Outpatient Medications  Medication Sig Dispense Refill  . aspirin 81 MG tablet Take 81 mg by mouth daily.    . metoprolol tartrate (LOPRESSOR) 25 MG tablet Take 1 tablet (25 mg total) by mouth 2 (two) times daily. 180 tablet 3  . nitroGLYCERIN (NITROSTAT) 0.4 MG SL tablet Place 0.4 mg under the tongue every 5 (five) minutes as needed for chest pain.    . Omega-3 Fatty Acids (FISH OIL) 1200 MG CAPS Take 1,200 mg by mouth every evening.    . ranitidine (ZANTAC) 150 MG tablet Take 150 mg by mouth 2 (two) times daily.     . simvastatin (ZOCOR) 80 MG tablet TAKE 1 TABLET BY MOUTH EVERY NIGHT AT BEDTIME 90 tablet 3  . triamterene-hydrochlorothiazide (DYAZIDE) 37.5-25 MG capsule Take 1 each (1 capsule total) by mouth daily.    . vitamin C (ASCORBIC ACID) 500 MG tablet Take 500 mg by mouth every evening.     No current facility-administered medications for this visit.     No Known Allergies  Review of Systems negative except from HPI and PMH  Physical Exam BP 132/75   Pulse 69   Ht 5\' 5"  (1.651 m)   Wt 204 lb (92.5 kg)   SpO2 96%   BMI 33.95 kg/m   Well developed and nourished in no acute distress HENT normal Neck supple with JVP-7 Clear Regular rate and rhythm, 2/6 M Abd-soft with active BS No Clubbing cyanosis tredema Skin-warm and dry A & Oriented  Grossly normal sensory and motor function  ECG demonstrates sinus rhythm at 59 LBBB 20/17/50  Assessment and  Plan  Aortic stenosis-severe s/p TAVI  Coronary artery disease with prior bypass grafting\  Pacemaker-St. Jude [name] device was interrogated.  The information was reviewed. No changes were made in the programming.     Sinus node dysfunction  LBBB p TAVR   Hypertension I called and left his cast relative because made of his new  Ventricular tachycardia   Paroxysmal atrial fibrillation  Chest pain   Hyperlipidemia  No intercurrent atrial fibrillation or  flutter  20% pacing--atrial  Without symptoms of ischemia  BP well controlled  No intercurrent Ventricular tachycardia  LBBB stable   Will check BMET on diuretics   LDL at goal but it has been a year  Will need to recheck   Chest pain is atypical.  I suspect it is more musculoskeletal.  No indication for cardiac cause.

## 2017-03-04 LAB — BASIC METABOLIC PANEL
BUN / CREAT RATIO: 17 (ref 10–24)
BUN: 18 mg/dL (ref 8–27)
CALCIUM: 9.2 mg/dL (ref 8.6–10.2)
CHLORIDE: 102 mmol/L (ref 96–106)
CO2: 25 mmol/L (ref 20–29)
Creatinine, Ser: 1.07 mg/dL (ref 0.76–1.27)
GFR calc non Af Amer: 65 mL/min/{1.73_m2} (ref >59)
GFR, EST AFRICAN AMERICAN: 75 mL/min/{1.73_m2} (ref >59)
GLUCOSE: 71 mg/dL (ref 65–99)
POTASSIUM: 3.8 mmol/L (ref 3.5–5.2)
Sodium: 141 mmol/L (ref 134–144)

## 2017-03-05 ENCOUNTER — Telehealth: Payer: Self-pay

## 2017-03-05 NOTE — Telephone Encounter (Signed)
Pt is aware and agreeable to normal results  

## 2017-04-13 ENCOUNTER — Other Ambulatory Visit: Payer: Self-pay | Admitting: Physician Assistant

## 2017-04-14 ENCOUNTER — Encounter: Payer: Medicare Other | Admitting: Internal Medicine

## 2017-05-18 ENCOUNTER — Other Ambulatory Visit: Payer: Self-pay | Admitting: Internal Medicine

## 2017-06-02 ENCOUNTER — Ambulatory Visit (INDEPENDENT_AMBULATORY_CARE_PROVIDER_SITE_OTHER): Payer: Medicare Other | Admitting: *Deleted

## 2017-06-02 ENCOUNTER — Telehealth: Payer: Self-pay | Admitting: Cardiology

## 2017-06-02 DIAGNOSIS — I495 Sick sinus syndrome: Secondary | ICD-10-CM

## 2017-06-02 NOTE — Telephone Encounter (Signed)
Spoke with pt and reminded pt of remote transmission that is due today. Pt verbalized understanding.   

## 2017-06-02 NOTE — Progress Notes (Signed)
Remote pacemaker transmission.   

## 2017-06-03 ENCOUNTER — Encounter: Payer: Self-pay | Admitting: Cardiology

## 2017-06-03 NOTE — Progress Notes (Signed)
Letter  

## 2017-06-05 LAB — CUP PACEART REMOTE DEVICE CHECK
Battery Remaining Percentage: 57 %
Battery Voltage: 2.89 V
Brady Statistic AP VP Percent: 1 %
Brady Statistic AS VS Percent: 84 %
Implantable Lead Implant Date: 19990914
Implantable Pulse Generator Implant Date: 20110601
Lead Channel Impedance Value: 360 Ohm
Lead Channel Impedance Value: 480 Ohm
Lead Channel Pacing Threshold Amplitude: 0.5 V
Lead Channel Pacing Threshold Pulse Width: 0.7 ms
Lead Channel Sensing Intrinsic Amplitude: 0.5 mV
Lead Channel Setting Pacing Amplitude: 1.75 V
MDC IDC LEAD IMPLANT DT: 19990914
MDC IDC LEAD LOCATION: 753859
MDC IDC LEAD LOCATION: 753860
MDC IDC MSMT BATTERY REMAINING LONGEVITY: 73 mo
MDC IDC MSMT LEADCHNL RA PACING THRESHOLD PULSEWIDTH: 0.4 ms
MDC IDC MSMT LEADCHNL RV PACING THRESHOLD AMPLITUDE: 1.5 V
MDC IDC MSMT LEADCHNL RV SENSING INTR AMPL: 12 mV
MDC IDC PG SERIAL: 7130714
MDC IDC SESS DTM: 20190319145436
MDC IDC SET LEADCHNL RA PACING AMPLITUDE: 2 V
MDC IDC SET LEADCHNL RV PACING PULSEWIDTH: 0.7 ms
MDC IDC SET LEADCHNL RV SENSING SENSITIVITY: 2 mV
MDC IDC STAT BRADY AP VS PERCENT: 15 %
MDC IDC STAT BRADY AS VP PERCENT: 1 %
MDC IDC STAT BRADY RA PERCENT PACED: 15 %
MDC IDC STAT BRADY RV PERCENT PACED: 1 %

## 2017-07-27 ENCOUNTER — Encounter: Payer: Self-pay | Admitting: Cardiovascular Disease

## 2017-07-27 ENCOUNTER — Ambulatory Visit (INDEPENDENT_AMBULATORY_CARE_PROVIDER_SITE_OTHER): Payer: Medicare Other | Admitting: Cardiovascular Disease

## 2017-07-27 VITALS — BP 122/64 | HR 55 | Ht 65.0 in | Wt 200.1 lb

## 2017-07-27 DIAGNOSIS — I350 Nonrheumatic aortic (valve) stenosis: Secondary | ICD-10-CM | POA: Diagnosis not present

## 2017-07-27 NOTE — Progress Notes (Signed)
Cardiology Office Note Date:  07/27/2017   ID:  [name], DOB 1934-03-30, MRN [id number]  PCP:  [name], MD  Cardiologist:  Sherren Mocha, MD    Chief Complaint  Patient presents with  . Chest Pain     History of Present Illness: [name] is a 83 y.o. male who presents for follow-up of aortic valve disease status post TAVR.  He underwent TAVR in March 2017 via a left percutaneous transfemoral approach using a 29 mm Edwards Sapien 3 valve.  The patient is undergone permanent pacemaker placement initially in 1999 and device change out in 2006.  He is followed by Dr. [name].  He also has coronary artery disease with history of 5 vessel CABG in 1997.  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  All of his other bypass grafts were patent at the time of his cardiac catheterization in 2017.   He complains of periods of left sided chest pain around the left breast that might last for 1-2 days on and off. He hasn't experienced this in the past month. There is no association with physical exertion. No dyspnea.  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  He has stable shortness of breath with activity.  He denies edema, orthopnea, or PND.  He has to walk up a hill on the way back from his mailbox and he has shortness of breath with that level of physical exertion.  He does not have any chest discomfort with walking.  Past Medical History:  Diagnosis Date  . AAA (abdominal aortic aneurysm) ([place])    a. Last duplex - 3.8cm 01/2013 - due 01/2014.  [name] Acne rosacea   . Anginal pain ([place]) 1996  . Aortic stenosis, mild    Last echo 05/04/12 +LVH  . Arthritis    shoulders  . Back pain    hx epidural injections  . Baker's cyst    Lt.  [name] CAD (coronary artery disease)    a. s/p MI/PTCA - Cx 1994. b. s/p CABG x5 in 1997. c. Abnl nuc 2003- occ SVG-RCA, patent seq  SVG-OM1-dCxOM, patentl LIMA-LAD, patent, SVG-diag. d. Normal nuc 09/2012.  . Cancer ([place])   . Carotid artery disease ([place])    a. Duplex 01/2013: mildly abnormal. Mild plaque without significant diameter reduction. Repeat recommended 11/15.  [name] COPD (chronic obstructive pulmonary disease) ([place])   . Dizziness and giddiness 08/04/2013  . GERD (gastroesophageal reflux disease)   . H/O prostate cancer    a. s/p radical retropubic prostatectomy with bilateral pelvic lymph node dissection  . H/O: GI bleed    mild, neg. colonoscopy  . HOH (hard of hearing)   . HTN (hypertension)   . Hyperlipidemia   . Hypertensive heart disease   . OSA treated with BiPAP 11/20/2015   Severe with [facility] 38/hr  . Pacemaker -Tracy with change out 2006  . PAF (paroxysmal atrial fibrillation) ([place])    a. Remote per records.   . Pneumonia    history  . PVD (peripheral vascular disease) ([place])    a. Rt ext iliac stenosis, last PV cath 2003, moderate stenosis last Lower Ext Dopplers 12/16/11 - Rt. ABI 0.96  Lt ABI 1.0.  . Sick sinus syndrome ([place])    a. placed 1999, gen change 2011 - St. Jude.  . Sinus node dysfunction (  [place]) 01/20/2013  . Syncope 11/28/97  . Thrombocytopenia ([facility])    chronic, mild  . Valvular heart disease    a. Echo 04/2012: mild-mod AS, mild AI, mild MR.    Past Surgical History:  Procedure Laterality Date  . CARDIAC CATHETERIZATION  2003   VG to RCA occluded, other grafts patent  . CARDIAC CATHETERIZATION N/A 05/04/2015   Procedure: Right/Left Heart Cath and Coronary/Graft Angiography;  Surgeon: Sherren Mocha, MD;  Location: [facility];  Service: Cardiovascular;  Laterality: N/A;  . CATARACT EXTRACTION Bilateral    with lens  . cataracts    . COLONOSCOPY    . CORONARY ANGIOPLASTY  03/29/92   PTCA to LCX  . CORONARY ARTERY BYPASS GRAFT  1997   LIMA-LAD; VG-diag; seq VG-1st OM & distal LCX; VG-RCA  . PACEMAKER GENERATOR CHANGE  08/15/2009   St. Jude accent  . PACEMAKER  INSERTION  11/28/97   pacesetter--ERI 2011  . SUPRAPUBIC PROSTATECTOMY    . TEE WITHOUT CARDIOVERSION N/A 06/12/2015   Procedure: TRANSESOPHAGEAL ECHOCARDIOGRAM (TEE);  Surgeon: Sherren Mocha, MD;  Location: [facility];  Service: Open Heart Surgery;  Laterality: N/A;  . TRANSCATHETER AORTIC VALVE REPLACEMENT, TRANSFEMORAL N/A 06/12/2015   Procedure: TRANSCATHETER AORTIC VALVE REPLACEMENT, TRANSFEMORAL, possible transpical;  Surgeon: Sherren Mocha, MD;  Location: [facility];  Service: Open Heart Surgery;  Laterality: N/A;    Current Outpatient Medications  Medication Sig Dispense Refill  . acetaminophen (TYLENOL) 650 MG CR tablet Take 650 mg by mouth 2 (two) times daily.    [name] aspirin 81 MG tablet Take 81 mg by mouth daily.    . metoprolol tartrate (LOPRESSOR) 25 MG tablet TAKE 1 TABLET BY MOUTH TWICE DAILY 180 tablet 3  . nitroGLYCERIN (NITROSTAT) 0.4 MG SL tablet Place 0.4 mg under the tongue every 5 (five) minutes as needed for chest pain.    . Omega-3 Fatty Acids (FISH OIL) 1200 MG CAPS Take 1,200 mg by mouth every evening.    . ranitidine (ZANTAC) 150 MG tablet Take 150 mg by mouth 2 (two) times daily.     . simvastatin (ZOCOR) 80 MG tablet TAKE 1 TABLET BY MOUTH EVERY NIGHT AT BEDTIME 90 tablet 3  . triamterene-hydrochlorothiazide (MAXZIDE-25) 37.5-25 MG tablet TAKE 1 TABLET BY MOUTH DAILY MONDAY THROUGH FRIDAY 90 tablet 2  . vitamin C (ASCORBIC ACID) 500 MG tablet Take 500 mg by mouth every evening.     No current facility-administered medications for this visit.     Allergies:   Patient has no known allergies.   Social History:  The patient  reports that he quit smoking about 22 years ago. His smoking use included cigarettes. He has a 96.00 pack-year smoking history. He has never used smokeless tobacco. He reports that he does not drink alcohol or use drugs.   Family History:  [name]  family history includes CAD in his unknown relative; Cancer in his brother; Colon cancer in his father;  Heart Problems in his brother and sister.   ROS:  Please see the history of present illness.  All other systems are reviewed and negative.   PHYSICAL EXAM: VS:  BP 122/64   Pulse (!) 55   Ht 5\' 5"  (1.651 m)   Wt 200 lb 1.9 oz (90.8 kg)   SpO2 96%   BMI 33.30 kg/m  , BMI Body mass index is 33.3 kg/m. GEN: Well nourished, well developed, in no acute distress  HEENT: normal  Neck: no JVD, no masses. No  carotid bruits Cardiac: RRR with 2/6 SEM at the RUSB   Respiratory:  clear to auscultation bilaterally, normal work of breathing GI: soft, nontender, nondistended, + BS MS: no deformity or atrophy  Ext: no pretibial edema, pedal pulses 2+= bilaterally Skin: warm and dry, no rash Neuro:  Strength and sensation are intact Psych: euthymic mood, full affect  EKG:  EKG is not ordered today.  Recent Labs: 03/03/2017: BUN 18; Creatinine, Ser 1.07; Potassium 3.8; Sodium 141   Lipid Panel     Component Value Date/Time   CHOL 129 04/26/2015 1620   TRIG 207 (H) 04/26/2015 1620   HDL 39 (L) 04/26/2015 1620   CHOLHDL 3.3 04/26/2015 1620   VLDL 41 (H) 04/26/2015 1620   LDLCALC 49 04/26/2015 1620      Wt Readings from Last 3 Encounters:  07/27/17 200 lb 1.9 oz (90.8 kg)  03/03/17 204 lb (92.5 kg)  11/20/16 202 lb 1.9 oz (91.7 kg)     Cardiac Studies Reviewed: Echo 07/07/2016: Study Conclusions  - Left ventricle: The cavity size was normal. Wall thickness was   normal. Systolic function was normal. The estimated ejection   fraction was in the range of 55% to 60%. Wall motion was normal;   there were no regional wall motion abnormalities. Septal bounce   consistent with RV pacing. Doppler parameters are consistent with   abnormal left ventricular relaxation (grade 1 diastolic   dysfunction). - Aortic valve: Bioprosthetic aortic valve, s/p TAVR. No   significant valvular stenosis. There was no significant   regurgitation. Mean gradient (S): 17 mm Hg. Valve area (VTI):   1.49  cm^2. - Aorta: Ascending aortic diameter: 38 mm (S). - Ascending aorta: The ascending aorta was mildly dilated. - Mitral valve: Mildly calcified annulus. There was trivial   regurgitation. - Left atrium: The atrium was mildly dilated. - Right ventricle: The cavity size was normal. Pacer wire or   catheter noted in right ventricle. Systolic function was normal. - Tricuspid valve: Peak RV-RA gradient (S): 35 mm Hg. - Pulmonary arteries: PA peak pressure: 38 mm Hg (S). - Inferior vena cava: The vessel was normal in size. The   respirophasic diameter changes were in the normal range (>= 50%),   consistent with normal central venous pressure.  Impressions:  - Normal LV size with EF 55-60%. Septal bounce consistent with RV   pacing. Normal RV size and systolic function. Bioprosthetic   aortic valve s/p TAVR, the valve was functioning normally. Mild   pulmonary hypertension.  Cardiac Cath 05/04/2015: Conclusion    Ost LM lesion, 40% stenosed.  Ost Cx to Prox Cx lesion, 90% stenosed.  Ost LAD to Prox LAD lesion, 75% stenosed.  Ost Ramus lesion, 99% stenosed.  Mid LAD lesion, 50% stenosed.  Prox RCA lesion, 50% stenosed.  Dist RCA lesion, 25% stenosed.  SVG .  Widely patent SVG-diagonal  SVG was injected is normal in caliber.  Widely patent sequential SVG-Ramus and OM2  SVG was injected .  Origin lesion, 100% stenosed.   1. Severe 3 vessel CAD with diffuse calcific LAD stenosis and severe proximal LCx stenosis 2. Calcified RCA with mild diffuse nonobstructive disease 3. S/P aortocoronary bypass with patent LIMA-LAD, SVG-diagonal, and sequential SVG-ramus and OM2. Chronic occlusion of the SVG-right PDA 4. Moderate-severe aortic stenosis with mean gradient 28 mmHg and calculated AVA 1.08 square cm  Continued evaluation for TAVR. Pt clearly has severe aortic stenosis by echo criteria and progressive clinical symptoms.  ASSESSMENT AND PLAN: 1.  Aortic valve  disease status post TAVR: The patient appears to be doing well.  I reviewed his echo from 1 year ago and plan to repeat his study next year when he returns for follow-up.  He understands the indication for lifelong SBE prophylaxis.  He is treated with aspirin for antiplatelet therapy.  2.  Coronary artery disease, native vessel and bypass graft disease: Patient has some symptoms of atypical chest pain.  He does not have any component of exertional angina.  I think continued medical management is indicated as he is treated with aspirin, a statin drug, and a beta-blocker.  3.  Hypertension: Blood pressure is well controlled on metoprolol and triamterene/hydrochlorothiazide.  4.  Hyperlipidemia: Treated with simvastatin.Last lipids reviewed demonstrate a cholesterol of 129, LDL 49, HDL 39.  Current medicines are reviewed with the patient today.  The patient does not have concerns regarding medicines.  Labs/ tests ordered today include:   Orders Placed This Encounter  Procedures  . ECHOCARDIOGRAM COMPLETE    Disposition:   FU one year with an echo prior to the visit  Signed, Sherren Mocha, MD  07/27/2017 1:56 PM    [facility] [address], [place], [place]  [address] Phone: [phone]; Fax: [phone]

## 2017-07-27 NOTE — Patient Instructions (Signed)
Medication Instructions:  Your provider recommends that you continue on your current medications as directed. Please refer to the Current Medication list given to you today.    Labwork: None  Testing/Procedures: Your provider has requested that you have an echocardiogram in 1 year. Echocardiography is a painless test that uses sound waves to create images of your heart. It provides your doctor with information about the size and shape of your heart and how well your heart's chambers and valves are working. This procedure takes approximately one hour. There are no restrictions for this procedure.  Follow-Up: Your provider wants you to follow-up in: 1 year with Dr. Burt Knack. You will be called to schedule this appointment and your echocardiogram.  Any Other Special Instructions Will Be Listed Below (If Applicable).     If you need a refill on your cardiac medications before your next appointment, please call your pharmacy.

## 2017-08-25 ENCOUNTER — Encounter (HOSPITAL_COMMUNITY): Payer: Self-pay

## 2017-08-25 ENCOUNTER — Observation Stay (HOSPITAL_COMMUNITY)
Admission: EM | Admit: 2017-08-25 | Discharge: 2017-08-28 | Disposition: A | Discharge status: Home / Self Care | Payer: Medicare Other | Attending: Internal Medicine | Admitting: Internal Medicine

## 2017-08-25 ENCOUNTER — Emergency Department (HOSPITAL_COMMUNITY): Payer: Medicare Other

## 2017-08-25 DIAGNOSIS — Z9842 Cataract extraction status, left eye: Secondary | ICD-10-CM | POA: Insufficient documentation

## 2017-08-25 DIAGNOSIS — I2584 Coronary atherosclerosis due to calcified coronary lesion: Secondary | ICD-10-CM | POA: Diagnosis not present

## 2017-08-25 DIAGNOSIS — I25110 Atherosclerotic heart disease of native coronary artery with unstable angina pectoris: Principal | ICD-10-CM | POA: Insufficient documentation

## 2017-08-25 DIAGNOSIS — Z951 Presence of aortocoronary bypass graft: Secondary | ICD-10-CM | POA: Insufficient documentation

## 2017-08-25 DIAGNOSIS — I6529 Occlusion and stenosis of unspecified carotid artery: Secondary | ICD-10-CM | POA: Diagnosis not present

## 2017-08-25 DIAGNOSIS — Z9889 Other specified postprocedural states: Secondary | ICD-10-CM | POA: Insufficient documentation

## 2017-08-25 DIAGNOSIS — Z9841 Cataract extraction status, right eye: Secondary | ICD-10-CM | POA: Insufficient documentation

## 2017-08-25 DIAGNOSIS — R9439 Abnormal result of other cardiovascular function study: Secondary | ICD-10-CM | POA: Insufficient documentation

## 2017-08-25 DIAGNOSIS — E785 Hyperlipidemia, unspecified: Secondary | ICD-10-CM | POA: Insufficient documentation

## 2017-08-25 DIAGNOSIS — M7122 Synovial cyst of popliteal space [Baker], left knee: Secondary | ICD-10-CM | POA: Diagnosis not present

## 2017-08-25 DIAGNOSIS — I110 Hypertensive heart disease with heart failure: Secondary | ICD-10-CM | POA: Diagnosis not present

## 2017-08-25 DIAGNOSIS — I480 Paroxysmal atrial fibrillation: Secondary | ICD-10-CM | POA: Diagnosis not present

## 2017-08-25 DIAGNOSIS — Z7982 Long term (current) use of aspirin: Secondary | ICD-10-CM | POA: Diagnosis not present

## 2017-08-25 DIAGNOSIS — G4733 Obstructive sleep apnea (adult) (pediatric): Secondary | ICD-10-CM | POA: Insufficient documentation

## 2017-08-25 DIAGNOSIS — Z8546 Personal history of malignant neoplasm of prostate: Secondary | ICD-10-CM | POA: Diagnosis not present

## 2017-08-25 DIAGNOSIS — I495 Sick sinus syndrome: Secondary | ICD-10-CM | POA: Insufficient documentation

## 2017-08-25 DIAGNOSIS — I200 Unstable angina: Secondary | ICD-10-CM | POA: Diagnosis present

## 2017-08-25 DIAGNOSIS — H9190 Unspecified hearing loss, unspecified ear: Secondary | ICD-10-CM | POA: Diagnosis not present

## 2017-08-25 DIAGNOSIS — J449 Chronic obstructive pulmonary disease, unspecified: Secondary | ICD-10-CM | POA: Diagnosis not present

## 2017-08-25 DIAGNOSIS — R110 Nausea: Secondary | ICD-10-CM

## 2017-08-25 DIAGNOSIS — Z8549 Personal history of malignant neoplasm of other male genital organs: Secondary | ICD-10-CM | POA: Insufficient documentation

## 2017-08-25 DIAGNOSIS — Z87891 Personal history of nicotine dependence: Secondary | ICD-10-CM | POA: Insufficient documentation

## 2017-08-25 DIAGNOSIS — R072 Precordial pain: Secondary | ICD-10-CM

## 2017-08-25 DIAGNOSIS — D696 Thrombocytopenia, unspecified: Secondary | ICD-10-CM

## 2017-08-25 DIAGNOSIS — K219 Gastro-esophageal reflux disease without esophagitis: Secondary | ICD-10-CM | POA: Insufficient documentation

## 2017-08-25 DIAGNOSIS — Z79899 Other long term (current) drug therapy: Secondary | ICD-10-CM | POA: Insufficient documentation

## 2017-08-25 DIAGNOSIS — I714 Abdominal aortic aneurysm, without rupture, unspecified: Secondary | ICD-10-CM | POA: Diagnosis present

## 2017-08-25 DIAGNOSIS — I5030 Unspecified diastolic (congestive) heart failure: Secondary | ICD-10-CM | POA: Insufficient documentation

## 2017-08-25 DIAGNOSIS — Z809 Family history of malignant neoplasm, unspecified: Secondary | ICD-10-CM | POA: Insufficient documentation

## 2017-08-25 DIAGNOSIS — K7011 Alcoholic hepatitis with ascites: Secondary | ICD-10-CM

## 2017-08-25 DIAGNOSIS — R0602 Shortness of breath: Secondary | ICD-10-CM

## 2017-08-25 DIAGNOSIS — Z952 Presence of prosthetic heart valve: Secondary | ICD-10-CM

## 2017-08-25 DIAGNOSIS — Z800 Family history of malignant neoplasm of digestive organs: Secondary | ICD-10-CM | POA: Insufficient documentation

## 2017-08-25 DIAGNOSIS — I2510 Atherosclerotic heart disease of native coronary artery without angina pectoris: Secondary | ICD-10-CM | POA: Diagnosis present

## 2017-08-25 DIAGNOSIS — Z950 Presence of cardiac pacemaker: Secondary | ICD-10-CM | POA: Diagnosis not present

## 2017-08-25 DIAGNOSIS — Z955 Presence of coronary angioplasty implant and graft: Secondary | ICD-10-CM | POA: Diagnosis not present

## 2017-08-25 DIAGNOSIS — R079 Chest pain, unspecified: Secondary | ICD-10-CM | POA: Diagnosis present

## 2017-08-25 DIAGNOSIS — Z45018 Encounter for adjustment and management of other part of cardiac pacemaker: Secondary | ICD-10-CM

## 2017-08-25 DIAGNOSIS — R05 Cough: Secondary | ICD-10-CM

## 2017-08-25 DIAGNOSIS — R42 Dizziness and giddiness: Secondary | ICD-10-CM

## 2017-08-25 DIAGNOSIS — R059 Cough, unspecified: Secondary | ICD-10-CM

## 2017-08-25 DIAGNOSIS — I10 Essential (primary) hypertension: Secondary | ICD-10-CM | POA: Diagnosis present

## 2017-08-25 HISTORY — DX: Alcoholic hepatitis with ascites: K70.11

## 2017-08-25 LAB — COMPREHENSIVE METABOLIC PANEL
ALK PHOS: 69 U/L (ref 38–126)
ALT: 29 U/L (ref 17–63)
AST: 44 U/L — AB (ref 15–41)
Albumin: 3.8 g/dL (ref 3.5–5.0)
Anion gap: 10 (ref 5–15)
BUN: 18 mg/dL (ref 6–20)
CALCIUM: 9.4 mg/dL (ref 8.9–10.3)
CHLORIDE: 106 mmol/L (ref 101–111)
CO2: 25 mmol/L (ref 22–32)
CREATININE: 0.97 mg/dL (ref 0.61–1.24)
Glucose, Bld: 89 mg/dL (ref 65–99)
Potassium: 4.2 mmol/L (ref 3.5–5.1)
Sodium: 141 mmol/L (ref 135–145)
Total Bilirubin: 1.1 mg/dL (ref 0.3–1.2)
Total Protein: 7.3 g/dL (ref 6.5–8.1)

## 2017-08-25 LAB — CBC WITH DIFFERENTIAL/PLATELET
BASOS PCT: 0 %
Basophils Absolute: 0 10*3/uL (ref 0.0–0.1)
EOS ABS: 0.1 10*3/uL (ref 0.0–0.7)
EOS PCT: 2 %
HCT: 38.5 % — ABNORMAL LOW (ref 39.0–52.0)
Hemoglobin: 13.1 g/dL (ref 13.0–17.0)
Lymphocytes Relative: 34 %
Lymphs Abs: 1.1 10*3/uL (ref 0.7–4.0)
MCH: 32.5 pg (ref 26.0–34.0)
MCHC: 34 g/dL (ref 30.0–36.0)
MCV: 95.5 fL (ref 78.0–100.0)
MONO ABS: 0.5 10*3/uL (ref 0.1–1.0)
Monocytes Relative: 14 %
NEUTROS PCT: 50 %
Neutro Abs: 1.6 10*3/uL — ABNORMAL LOW (ref 1.7–7.7)
PLATELETS: 88 10*3/uL — AB (ref 150–400)
RBC: 4.03 MIL/uL — ABNORMAL LOW (ref 4.22–5.81)
RDW: 12.9 % (ref 11.5–15.5)
WBC: 3.3 10*3/uL — AB (ref 4.0–10.5)

## 2017-08-25 LAB — BRAIN NATRIURETIC PEPTIDE: B NATRIURETIC PEPTIDE 5: 121.7 pg/mL — AB (ref 0.0–100.0)

## 2017-08-25 LAB — I-STAT TROPONIN, ED: Troponin i, poc: 0.02 ng/mL (ref 0.00–0.08)

## 2017-08-25 MED ORDER — GI COCKTAIL ~~LOC~~
30.0000 mL | Freq: Once | ORAL | Status: AC
  Administered 2017-08-25: 30 mL via ORAL
  Filled 2017-08-25: qty 30

## 2017-08-25 MED ORDER — MORPHINE SULFATE (PF) 4 MG/ML IV SOLN
4.0000 mg | Freq: Once | INTRAVENOUS | Status: AC
  Administered 2017-08-25: 4 mg via INTRAVENOUS
  Filled 2017-08-25: qty 1

## 2017-08-25 MED ORDER — ASPIRIN 81 MG PO CHEW
324.0000 mg | CHEWABLE_TABLET | Freq: Once | ORAL | Status: AC
Start: 2017-08-25 — End: 2017-08-25
  Administered 2017-08-25: 324 mg via ORAL
  Filled 2017-08-25: qty 4

## 2017-08-25 NOTE — ED Triage Notes (Signed)
Pt complains of a pressure like chest pain for two days, he has had some relief with nitro, he took one today about two hours ago, pt also states that he's nauseated and shaky and that's new for him Pt has hx of MI and has an aneurysm

## 2017-08-25 NOTE — ED Provider Notes (Signed)
[facility] DEPT Provider Note   CSN: [id number] Arrival date & time: 08/25/17  1905     History   Chief Complaint Chief Complaint  Patient presents with  . Chest Pain    HPI [name] is a 83 y.o. male with a PMHx of AAA, aortic stenosis s/p TAVR, HTN, HLD, CAD/MI s/p CABG and stents, PAF, SSS s/p pacemaker insertion, PVD, COPD, GERD, remote prostate CA s/p prostatectomy, thrombocytopenia, arthritis, and multiple other medical conditions listed below, who presents to the ED with complaints of chest pain that initially began last week lasting for about 2 days before resolving, and then returned 2 days ago.  Of note, pt is somewhat of a poor historian, therefore history is difficult and slightly limited.  Patient states that he was walking to his mailbox last week and started having chest pain in the lower central chest and somewhat into the left anterior chest, felt nauseated and lightheaded as well as short of breath and "shaky"; he went inside and laid down which helped, states that the symptoms lasted for about 2 days after that before resolving spontaneously.  Two days ago while he was sitting on his couch his symptoms returned and have not gone away, and got worse today so he came in for evaluation.  He states that his pain feels similar to his prior MI.  He describes his pain as 5/10 mostly constant waxing/waning pressure-like pain in the center of his lower anterior chest and somewhat into the left anterior chest, nonradiating, with no known aggravating factors, and somewhat improved with 1 SL nitroglycerin that he took earlier today.  He isn't sure if his pain will worsen with exertion because he hasn't exerted himself since last week, but the initial pain last week started during exertion.  He denies any worsening with inspiration or movement.  He reports associated nausea, lightheadedness, and shortness of breath particularly with exertion.  He also  mentions that he is had a cough with yellow sputum production for a few days.  He sometimes refers to the pain as intermittent, but then states it's constant; it's hard to tell which one it is.  He is a non-smoker.  His PCP is Dr. Arelia Sneddon in [place].  His cardiologist is Dr. [name] at [facility]; his last cardiology visit was 07/27/17 with Dr. Burt Knack.    He denies diaphoresis, fevers, chills, LE swelling, recent travel/surgery/immobilization, personal/family hx of DVT/PE, abd pain, V/D/C, hematuria, dysuria, myalgias, arthralgias, claudication, orthopnea, numbness, tingling, focal weakness, or any other complaints at this time.  He reports being compliant with all of his medications.   The history is provided by the patient and medical records. No language interpreter was used.  Chest Pain   Associated symptoms include cough, nausea and shortness of breath. Pertinent negatives include no abdominal pain, no diaphoresis, no fever, no numbness, no vomiting and no weakness.    Past Medical History:  Diagnosis Date  . AAA (abdominal aortic aneurysm) ([place])    a. Last duplex - 3.8cm 01/2013 - due 01/2014.  [name] Acne rosacea   . Anginal pain ([place]) 1996  . Aortic stenosis, mild    Last echo 05/04/12 +LVH  . Arthritis    shoulders  . Back pain    hx epidural injections  . Baker's cyst    Lt.  [name] CAD (coronary artery disease)    a. s/p MI/PTCA - Cx 1994. b. s/p CABG x5 in 1997. c. Abnl nuc 2003- occ SVG-RCA, patent  seq SVG-OM1-dCxOM, patentl LIMA-LAD, patent, SVG-diag. d. Normal nuc 09/2012.  . Cancer ([place])   . Carotid artery disease ([place])    a. Duplex 01/2013: mildly abnormal. Mild plaque without significant diameter reduction. Repeat recommended 11/15.  [name] COPD (chronic obstructive pulmonary disease) ([place])   . Dizziness and giddiness 08/04/2013  . GERD (gastroesophageal reflux disease)   . H/O prostate cancer    a. s/p radical retropubic prostatectomy with bilateral pelvic lymph node dissection  .  H/O: GI bleed    mild, neg. colonoscopy  . HOH (hard of hearing)   . HTN (hypertension)   . Hyperlipidemia   . Hypertensive heart disease   . OSA treated with BiPAP 11/20/2015   Severe with [facility] 38/hr  . Pacemaker -[place] with change out 2006  . PAF (paroxysmal atrial fibrillation) ([place])    a. Remote per records.   . Pneumonia    history  . PVD (peripheral vascular disease) ([place])    a. Rt ext iliac stenosis, last PV cath 2003, moderate stenosis last Lower Ext Dopplers 12/16/11 - Rt. ABI 0.96  Lt ABI 1.0.  . Sick sinus syndrome ([place])    a. placed 1999, gen change 2011 - St. Jude.  . Sinus node dysfunction ([place]) 01/20/2013  . Syncope 11/28/97  . Thrombocytopenia (HCC)    chronic, mild  . Valvular heart disease    a. Echo 04/2012: mild-mod AS, mild AI, mild MR.    Patient Active Problem List   Diagnosis Date Noted  . Essential hypertension 05/14/2016  . OSA treated with BiPAP 11/20/2015  . Obesity (BMI 30-39.9) 11/20/2015  . S/P TAVR (transcatheter aortic valve replacement) 06/12/2015  . Severe aortic stenosis 06/05/2015  . AAA (abdominal aortic aneurysm) ([place]) 04/27/2015  . PAD (peripheral artery disease) ([place] 04/27/2015  . Dizziness and giddiness 08/04/2013  . Dizziness 07/19/2013  . Sinus node dysfunction ([place]) 01/20/2013  . Hypertensive cardiovascular disease 01/20/2013  . PAF (paroxysmal atrial fibrillation) ([place])   . CAD (coronary artery disease) - status post CABG in 1997; known occluded SVG-RCA; negative Myoview July 2014   . Pacemaker -[employer]   . Aortic stenosis 05/07/2012    Class: Diagnosis of  . Syncope 11/28/1997    Past Surgical History:  Procedure Laterality Date  . CARDIAC CATHETERIZATION  2003   VG to RCA occluded, other grafts patent  . CARDIAC CATHETERIZATION N/A 05/04/2015   Procedure: Right/Left Heart Cath and Coronary/Graft Angiography;  Surgeon: Sherren Mocha, MD;  Location: [facility];  Service: Cardiovascular;  Laterality:  N/A;  . CATARACT EXTRACTION Bilateral    with lens  . cataracts    . COLONOSCOPY    . CORONARY ANGIOPLASTY  03/29/92   PTCA to LCX  . CORONARY ARTERY BYPASS GRAFT  1997   LIMA-LAD; VG-diag; seq VG-1st OM & distal LCX; VG-RCA  . PACEMAKER GENERATOR CHANGE  08/15/2009   [employer]. Jude accent  . PACEMAKER INSERTION  11/28/97   pacesetter--ERI 2011  . SUPRAPUBIC PROSTATECTOMY    . TEE WITHOUT CARDIOVERSION N/A 06/12/2015   Procedure: TRANSESOPHAGEAL ECHOCARDIOGRAM (TEE);  Surgeon: Sherren Mocha, MD;  Location: [facility];  Service: Open Heart Surgery;  Laterality: N/A;  . TRANSCATHETER AORTIC VALVE REPLACEMENT, TRANSFEMORAL N/A 06/12/2015   Procedure: TRANSCATHETER AORTIC VALVE REPLACEMENT, TRANSFEMORAL, possible transpical;  Surgeon: Sherren Mocha, MD;  Location: [facility];  Service: Open Heart Surgery;  Laterality: N/A;        Home Medications    Prior to  Admission medications   Medication Sig Start Date End Date Taking? Authorizing Provider  acetaminophen (TYLENOL) 650 MG CR tablet Take 650 mg by mouth 2 (two) times daily.   Yes [provider]  aspirin 81 MG tablet Take 81 mg by mouth daily.   Yes [provider]  metoprolol tartrate (LOPRESSOR) 25 MG tablet TAKE 1 TABLET BY MOUTH TWICE DAILY 04/13/17  Yes Baldwin Jamaica, PA-C  nitroGLYCERIN (NITROSTAT) 0.4 MG SL tablet Place 0.4 mg under the tongue every 5 (five) minutes as needed for chest pain.   Yes [provider]  Omega-3 Fatty Acids (FISH OIL) 1200 MG CAPS Take 1,200 mg by mouth every evening.   Yes [provider]  ranitidine (ZANTAC) 150 MG tablet Take 150 mg by mouth 2 (two) times daily.    Yes [provider]  simvastatin (ZOCOR) 80 MG tablet TAKE 1 TABLET BY MOUTH EVERY NIGHT AT BEDTIME 12/08/16  Yes Deboraha Sprang, MD  triamterene-hydrochlorothiazide ([id number]-99) 37.5-25 MG tablet TAKE 1 TABLET BY MOUTH DAILY MONDAY THROUGH FRIDAY 05/19/17  Yes Deboraha Sprang, MD  vitamin C (ASCORBIC ACID)  500 MG tablet Take 500 mg by mouth every evening.   Yes [provider]    Family History Family History  Problem Relation Age of Onset  . Colon cancer Father   . Cancer Brother   . Heart Problems Brother   . Heart Problems Sister   . CAD Unknown     Social History Social History   Tobacco Use  . Smoking status: Former Smoker    Packs/day: 2.00    Years: 48.00    Pack years: 96.00    Types: Cigarettes    Last attempt to quit: 03/18/1995    Years since quitting: 22.4  . Smokeless tobacco: Never Used  Substance Use Topics  . Alcohol use: No    Comment: occ  . Drug use: No     Allergies   Patient has no known allergies.   Review of Systems Review of Systems  Constitutional: Negative for chills, diaphoresis and fever.  Respiratory: Positive for cough and shortness of breath.   Cardiovascular: Positive for chest pain. Negative for leg swelling.  Gastrointestinal: Positive for nausea. Negative for abdominal pain, constipation, diarrhea and vomiting.  Genitourinary: Negative for dysuria and hematuria.  Musculoskeletal: Negative for arthralgias and myalgias.  Skin: Negative for color change.  Allergic/Immunologic: Negative for immunocompromised state.  Neurological: Positive for light-headedness. Negative for weakness and numbness.  Psychiatric/Behavioral: Negative for confusion.   All other systems reviewed and are negative for acute change except as noted in the HPI.    Physical Exam Updated Vital Signs BP (!) 162/89 (BP Location: Left Arm)   Pulse 85   Temp 98.8 F (37.1 C) (Oral)   Resp 16   SpO2 95%   Physical Exam  Constitutional: He is oriented to person, place, and time. Vital signs are normal. He appears well-developed and well-nourished.  Non-toxic appearance. No distress.  Afebrile, nontoxic, NAD, mildly hypertensive but similar to prior visits  HENT:  Head: Normocephalic and atraumatic.  Mouth/Throat: Oropharynx is clear and moist and mucous  membranes are normal.  Somewhat hard of hearing  Eyes: Conjunctivae and EOM are normal. Right eye exhibits no discharge. Left eye exhibits no discharge.  Neck: Normal range of motion. Neck supple.  Cardiovascular: Normal rate, regular rhythm, S1 normal, S2 normal and intact distal pulses. Exam reveals no gallop and no friction rub.  Murmur heard.  Systolic murmur is present. RRR, nl Z6/X0, soft systolic ejection murmur heard at RUSB but otherwise no other murmurs appreciated, no rubs/gallops appreciated, distal pulses intact, no pedal edema   Pulmonary/Chest: Effort normal and breath sounds normal. No respiratory distress. He has no decreased breath sounds. He has no wheezes. He has no rhonchi. He has no rales. He exhibits no tenderness, no crepitus, no deformity and no retraction.  CTAB in all lung fields, no w/r/r, no hypoxia or increased WOB, speaking in full sentences, SpO2 96% on RA Chest wall nonTTP without crepitus, deformities, or retractions   Abdominal: Soft. Normal appearance and bowel sounds are normal. He exhibits no distension. There is no tenderness. There is no rigidity, no rebound, no guarding, no CVA tenderness, no tenderness at McBurney's point and negative Murphy's sign.  Musculoskeletal: Normal range of motion.  MAE x4 Strength and sensation grossly intact in all extremities Distal pulses intact No pedal edema, neg homan's bilaterally   Neurological: He is alert and oriented to person, place, and time. He has normal strength. No sensory deficit.  Skin: Skin is warm, dry and intact. No rash noted.  Psychiatric: He has a normal mood and affect.  Nursing note and vitals reviewed.    ED Treatments / Results  Labs (all labs ordered are listed, but only abnormal results are displayed) Labs Reviewed  COMPREHENSIVE METABOLIC PANEL - Abnormal; Notable for the following components:      Result Value   AST 44 (*)    All other components within normal limits  CBC WITH  DIFFERENTIAL/PLATELET - Abnormal; Notable for the following components:   WBC 3.3 (*)    RBC 4.03 (*)    HCT 38.5 (*)    Platelets 88 (*)    Neutro Abs 1.6 (*)    All other components within normal limits  BRAIN NATRIURETIC PEPTIDE - Abnormal; Notable for the following components:   B Natriuretic Peptide 121.7 (*)    All other components within normal limits  I-STAT TROPONIN, ED    EKG EKG Interpretation  Date/Time:  Tuesday August 25 2017 19:19:09 EDT Ventricular Rate:  82 PR Interval:    QRS Duration: 162 QT Interval:  422 QTC Calculation: 493 R Axis:   81 Text Interpretation:  Sinus rhythm Left bundle branch block Confirmed by Milton Ferguson [id number]) on 08/25/2017 8:05:17 PM   Radiology Dg Chest 2 View  Result Date: 08/25/2017 CLINICAL DATA:  Pressure-like pain for 2 days, history COPD, hypertension EXAM: CHEST - 2 VIEW COMPARISON:  06/11/2016 FINDINGS: Sequential RIGHT subclavian transvenous pacemaker leads project at RIGHT atrium and RIGHT ventricle unchanged. Normal heart size post CABG and TAVR. Mediastinal contours and pulmonary vascularity normal. Atherosclerotic calcification and mild tortuosity of thoracic aorta. Lungs clear. No pulmonary infiltrate, pleural effusion or pneumothorax. Bones demineralized. IMPRESSION: Post CABG, TAVR and pacemaker. No acute abnormalities. Electronically Signed   By: Lavonia Dana M.D.   On: 08/25/2017 20:32     Echo 07/07/16: Study Conclusions - Left ventricle: The cavity size was normal. Wall thickness was   normal. Systolic function was normal. The estimated ejection   fraction was in the range of 55% to 60%. Wall motion was normal;   there were no regional wall motion abnormalities. Septal bounce   consistent with RV pacing. Doppler parameters are consistent with   abnormal left ventricular relaxation (grade 1 diastolic   dysfunction). - Aortic valve: Bioprosthetic aortic valve, s/p TAVR. No   significant valvular stenosis. There was no  significant  regurgitation. Mean gradient (S): 17 mm Hg. Valve area (VTI):   1.49 cm^2. - Aorta: Ascending aortic diameter: 38 mm (S). - Ascending aorta: The ascending aorta was mildly dilated. - Mitral valve: Mildly calcified annulus. There was trivial   regurgitation. - Left atrium: The atrium was mildly dilated. - Right ventricle: The cavity size was normal. Pacer wire or   catheter noted in right ventricle. Systolic function was normal. - Tricuspid valve: Peak RV-RA gradient (S): 35 mm Hg. - Pulmonary arteries: PA peak pressure: 38 mm Hg (S). - Inferior vena cava: The vessel was normal in size. The   respirophasic diameter changes were in the normal range (>= 50%),   consistent with normal central venous pressure.  Impressions: - Normal LV size with EF 55-60%. Septal bounce consistent with RV   pacing. Normal RV size and systolic function. Bioprosthetic   aortic valve s/p TAVR, the valve was functioning normally. Mild   pulmonary hypertension.   LHC 05/04/15: Conclusion   Ost LM lesion, 40% stenosed.  Ost Cx to Prox Cx lesion, 90% stenosed.  Ost LAD to Prox LAD lesion, 75% stenosed.  Ost Ramus lesion, 99% stenosed.  Mid LAD lesion, 50% stenosed.  Prox RCA lesion, 50% stenosed.  Dist RCA lesion, 25% stenosed.  SVG .  Widely patent SVG-diagonal  SVG was injected is normal in caliber.  Widely patent sequential SVG-Ramus and OM2  SVG was injected .  Origin lesion, 100% stenosed.   1. Severe 3 vessel CAD with diffuse calcific LAD stenosis and severe proximal LCx stenosis 2. Calcified RCA with mild diffuse nonobstructive disease 3. S/P aortocoronary bypass with patent LIMA-LAD, SVG-diagonal, and sequential SVG-ramus and OM2. Chronic occlusion of the SVG-right PDA 4. Moderate-severe aortic stenosis with mean gradient 28 mmHg and calculated AVA 1.08 square cm  Continued evaluation for TAVR. Pt clearly has severe aortic stenosis by echo criteria and progressive  clinical symptoms.     Procedures Procedures (including critical care time)  Medications Ordered in ED Medications  aspirin chewable tablet 324 mg (324 mg Oral Given 08/25/17 2036)  morphine 4 MG/ML injection 4 mg (4 mg Intravenous Given 08/25/17 2039)  gi cocktail (Maalox,Lidocaine,Donnatal) (30 mLs Oral Given 08/25/17 2036)     Initial Impression / Assessment and Plan / ED Course  I have reviewed the triage vital signs and the nursing notes.  Pertinent labs & imaging results that were available during my care of the patient were reviewed by me and considered in my medical decision making (see chart for details).     83 y.o. male here with CP that occurred for 2 days last week after he went to the mailbox, then resolved until 2 days ago when it started again but this time without inciting event. He has associated nausea, lightheadedness, and SOB. Also mentions having a cough for a few days. On exam, no reproducible chest tenderness, no abdominal tenderness, soft systolic ejection murmur at RUSB but otherwise no other m/r/g appreciated, no tachycardia or hypoxia, clear lung exam, no pedal edema, neg homan's sign bilaterally, distal pulses symmetric bilaterally. BP slightly elevated, but similar to prior visits. Doubt dissection or PE, but symptoms concerning for ACS/unstable angina. Had [facility] 04/2015 which showed 3 vessel CAD still; Echo 06/2016 reassuring (after TAVR). Will get labs, CXR, EKG, and give GI cocktail, ASA, and morphine then reassess shortly. Will also have nursing staff interrogate pacemaker. Anticipate likely admission for ACS r/o. Discussed case with my attending Dr. Roderic Palau who agrees with plan.  10:18 PM CBC w/diff with chronic stable thrombocytopenia plt 88, also with chronic stable leukopenia WBC 3.3, otherwise unremarkable on remainder of CBC. CMP with marginally elevated AST 44 but otherwise unremarkable. BNP marginally elevated at 121.7. Trop 0.02. EKG with LBBB which is  similar to prior EKG in 02/2017, no other acute ischemic findings on today's EKG however given LBBB it's difficult to assess for ischemic findings. CXR negative for acute findings.  Pt feeling better after meds, BP improved to 130s/80s. Given high HEART score and exertion CP/SOB, will admit for ACS r/o.   10:27 PM Dr. Alcario Drought of [facility] returning page and will admit. Holding orders to be placed by admitting team. Please see their notes for further documentation of care. I appreciate their help with this pleasant pt's care. Pt stable at time of admission.    Final Clinical Impressions(s) / ED Diagnoses   Final diagnoses:  Unstable angina (HCC)  Precordial pain  SOB (shortness of breath) on exertion  Nausea  Lightheadedness  Cough  Thrombocytopenia [facility])    ED Discharge Orders    [address], [place], [place] 08/25/17 2227    Milton Ferguson, MD 08/25/17 2333

## 2017-08-25 NOTE — ED Notes (Signed)
Bed: QK20 Expected date:  Expected time:  Means of arrival:  Comments: Hold for tr 1

## 2017-08-26 ENCOUNTER — Observation Stay (HOSPITAL_BASED_OUTPATIENT_CLINIC_OR_DEPARTMENT_OTHER): Payer: Medicare Other

## 2017-08-26 ENCOUNTER — Other Ambulatory Visit: Payer: Self-pay

## 2017-08-26 DIAGNOSIS — R079 Chest pain, unspecified: Secondary | ICD-10-CM | POA: Diagnosis not present

## 2017-08-26 DIAGNOSIS — I25110 Atherosclerotic heart disease of native coronary artery with unstable angina pectoris: Secondary | ICD-10-CM | POA: Diagnosis not present

## 2017-08-26 DIAGNOSIS — I351 Nonrheumatic aortic (valve) insufficiency: Secondary | ICD-10-CM | POA: Diagnosis not present

## 2017-08-26 LAB — ECHOCARDIOGRAM COMPLETE

## 2017-08-26 LAB — TROPONIN I: Troponin I: <0.03 ng/mL (ref <0.03)

## 2017-08-26 MED ORDER — OMEGA-3-ACID ETHYL ESTERS 1 G PO CAPS
1.0000 g | ORAL_CAPSULE | Freq: Every evening | ORAL | Status: DC
  Administered 2017-08-26 – 2017-08-27 (×2): 1 g via ORAL
  Filled 2017-08-26 (×3): qty 1

## 2017-08-26 MED ORDER — TRIAMTERENE-HCTZ 37.5-25 MG PO TABS
1.0000 | ORAL_TABLET | Freq: Every day | ORAL | Status: DC
  Administered 2017-08-27: 1 via ORAL
  Filled 2017-08-26 (×4): qty 1

## 2017-08-26 MED ORDER — FAMOTIDINE 20 MG PO TABS
20.0000 mg | ORAL_TABLET | Freq: Two times a day (BID) | ORAL | Status: DC
  Administered 2017-08-26 – 2017-08-27 (×4): 20 mg via ORAL
  Filled 2017-08-26 (×4): qty 1

## 2017-08-26 MED ORDER — ONDANSETRON HCL 4 MG/2ML IJ SOLN
4.0000 mg | Freq: Four times a day (QID) | INTRAMUSCULAR | Status: DC | PRN
Start: 2017-08-26 — End: 2017-08-28

## 2017-08-26 MED ORDER — ACETAMINOPHEN 325 MG PO TABS
650.0000 mg | ORAL_TABLET | Freq: Two times a day (BID) | ORAL | Status: DC
  Administered 2017-08-26 – 2017-08-27 (×4): 650 mg via ORAL
  Filled 2017-08-26 (×4): qty 2

## 2017-08-26 MED ORDER — FISH OIL 1200 MG PO CAPS: 1200.0000 mg | ORAL_CAPSULE | Freq: Every evening | ORAL | Status: DC

## 2017-08-26 MED ORDER — METOPROLOL TARTRATE 25 MG PO TABS
25.0000 mg | ORAL_TABLET | Freq: Two times a day (BID) | ORAL | Status: DC
  Administered 2017-08-26 – 2017-08-27 (×4): 25 mg via ORAL
  Filled 2017-08-26 (×4): qty 1

## 2017-08-26 MED ORDER — ASPIRIN EC 81 MG PO TBEC
81.0000 mg | DELAYED_RELEASE_TABLET | Freq: Every day | ORAL | Status: DC
  Administered 2017-08-26 – 2017-08-27 (×2): 81 mg via ORAL
  Filled 2017-08-26 (×2): qty 1

## 2017-08-26 MED ORDER — ATORVASTATIN CALCIUM 40 MG PO TABS
40.0000 mg | ORAL_TABLET | Freq: Every day | ORAL | Status: DC
  Administered 2017-08-26 – 2017-08-27 (×2): 40 mg via ORAL
  Filled 2017-08-26 (×3): qty 1

## 2017-08-26 MED ORDER — ACETAMINOPHEN 325 MG PO TABS: 650.0000 mg | ORAL_TABLET | ORAL | Status: DC | PRN

## 2017-08-26 MED ORDER — ENOXAPARIN SODIUM 40 MG/0.4ML ~~LOC~~ SOLN
40.0000 mg | SUBCUTANEOUS | Status: DC
  Administered 2017-08-26 – 2017-08-27 (×2): 40 mg via SUBCUTANEOUS
  Filled 2017-08-26 (×3): qty 0.4

## 2017-08-26 MED ORDER — FUROSEMIDE 10 MG/ML IJ SOLN
60.0000 mg | Freq: Once | INTRAMUSCULAR | Status: AC
  Administered 2017-08-26: 60 mg via INTRAVENOUS
  Filled 2017-08-26: qty 6

## 2017-08-26 NOTE — Progress Notes (Addendum)
[name] is a 83 year old male with past medical history significant for previous MI, coronary artery disease status post CABG, TAVR in 2017, hypertension, COPD, paroxysmal A. fib not on anticoagulation, remote prostate cancer status post prostatectomy, sick sinus syndrome status post pacemaker presented to the ED with complaints of chest pain associated with diaphoresis and dyspnea on exertion.  Patient stated chest pain was similar to previous MI.  At the time of this evaluation, the patient chest pain has resolved.  Cardiology has been consulted and is highly appreciated. 1st set of troponin <0.03.  Please refer to H&P dictated by Dr. Alcario Drought for further details of the assessment and plan.

## 2017-08-26 NOTE — Progress Notes (Signed)
  Echocardiogram 2D Echocardiogram has been performed.  [place] L Androw 08/26/2017, 2:46 PM

## 2017-08-26 NOTE — ED Notes (Signed)
Bed: GE36 Expected date:  Expected time:  Means of arrival:  Comments: ROOM 22

## 2017-08-26 NOTE — Consult Note (Addendum)
Cardiology Consultation:   Patient ID: [name]; [id number]; [birth year]   Admit date: 08/25/2017 Date of Consult: 08/26/2017  Primary Care Provider: [name], MD Primary Cardiologist: Dr. Fransico Him, MD Primary Electrophysiologist: Dr. [name], MD  Patient Profile:   [name] is a 83 y.o. male with a hx of CAD s/p PTCA and CABG (1994, 1995) now with chronic occlusion of SVG to RCA per cardiac cath 2017 (see below), AVS s/p TAVR 2017, HTN, remote history of PAF not on anticoagulation secondary to nosebleeds, COPD, sleep apnea on nocturnal BiPAP, remote prostate CA s/p prostatectomy and SSS s/p PPM who is being seen today for the evaluation of chest pain at the request of Dr. Alcario Drought.  History of Present Illness:   [name] an 83 year old male with a history stated above who presented to [facility] emergency department on 08/25/2017 with complaints of left-sided, nonradiating chest pain (below the left breast) which began acutely while walking to get his mail on 08/25/2017.  He had associated diaphoresis and dizziness all resolved with rest.  In interview, patient states chest pain initially began intermittently several months ago and is not associated specifically with exertion as it will happen with rest as well.  He states at times, this pain will come on while watching TV.  He describes the pain that he experienced yesterday as a dull ache.  He did take 1 sublingual nitroglycerin with resolution.  He describes prior episodes as more of a "sharp twinges" of pain, not the dull ache from yesterday.  He takes a daily Zantac for GERD symptoms and feels this is not necessarily like his prior reflux symptoms.  He is concerned, given that this is similar pain that he experienced with his prior MI many years ago. He does state that he has been under tremendous amount of stress over the past year related to the loss of 3 of his 5 children within a 66-month time period, 2 of which he  lost to cardiac events.  Of note, patient was last seen by Dr. Burt Knack on 07/27/2017 for follow-up of aortic valve disease s/p TAVR.  At that time, he had complaints of periods of left-sided chest pain around the left breast that lasts for approximately 1 to 2 days on and off with no associated symptoms that did not seem to be exertional in nature.  He has had trouble with arthritis in his left shoulder in the past and wondered if his chest discomfort could be related to that.  At the time, he denied LE edema, orthopnea or PND.  He stated he has to walk up a hill on the way back from his mailbox and has had shortness of breath with that level of physical exertion however, no chest pain.  Neurology was consulted given his prior history and current c/o chest pain.  Past Medical History:  Diagnosis Date  . AAA (abdominal aortic aneurysm) ([place])    a. Last duplex - 3.8cm 01/2013 - due 01/2014.  [name] Acne rosacea   . Anginal pain ([place]) 1996  . Aortic stenosis, mild    Last echo 05/04/12 +LVH  . Arthritis    shoulders  . Back pain    hx epidural injections  . Baker's cyst    Lt.  [name] CAD (coronary artery disease)    a. s/p MI/PTCA - Cx 1994. b. s/p CABG x5 in 1997. c. Abnl nuc 2003- occ SVG-RCA, patent seq SVG-OM1-dCxOM, patentl LIMA-LAD, patent, SVG-diag. d. Normal nuc 09/2012.  [name]  Cancer ([place])   . Carotid artery disease ([place])    a. Duplex 01/2013: mildly abnormal. Mild plaque without significant diameter reduction. Repeat recommended 11/15.  [name] COPD (chronic obstructive pulmonary disease) ([place])   . Dizziness and giddiness 08/04/2013  . GERD (gastroesophageal reflux disease)   . H/O prostate cancer    a. s/p radical retropubic prostatectomy with bilateral pelvic lymph node dissection  . H/O: GI bleed    mild, neg. colonoscopy  . HOH (hard of hearing)   . HTN (hypertension)   . Hyperlipidemia   . Hypertensive heart disease   . OSA treated with BiPAP 11/20/2015   Severe with [facility] 38/hr  . Pacemaker  -[place] with change out 2006  . PAF (paroxysmal atrial fibrillation) ([place])    a. Remote per records.   . Pneumonia    history  . PVD (peripheral vascular disease) ([place])    a. Rt ext iliac stenosis, last PV cath 2003, moderate stenosis last Lower Ext Dopplers 12/16/11 - Rt. ABI 0.96  Lt ABI 1.0.  . Sick sinus syndrome ([place])    a. placed 1999, gen change 2011 - St. Jude.  . Sinus node dysfunction ([place]) 01/20/2013  . Syncope 11/28/97  . Thrombocytopenia ([facility])    chronic, mild  . Valvular heart disease    a. Echo 04/2012: mild-mod AS, mild AI, mild MR.    Past Surgical History:  Procedure Laterality Date  . CARDIAC CATHETERIZATION  2003   VG to RCA occluded, other grafts patent  . CARDIAC CATHETERIZATION N/A 05/04/2015   Procedure: Right/Left Heart Cath and Coronary/Graft Angiography;  Surgeon: Sherren Mocha, MD;  Location: [place] [facility];  Service: Cardiovascular;  Laterality: N/A;  . CATARACT EXTRACTION Bilateral    with lens  . cataracts    . COLONOSCOPY    . CORONARY ANGIOPLASTY  03/29/92   PTCA to LCX  . CORONARY ARTERY BYPASS GRAFT  1997   LIMA-LAD; VG-diag; seq VG-1st OM & distal LCX; VG-RCA  . PACEMAKER GENERATOR CHANGE  08/15/2009   St. Jude accent  . PACEMAKER INSERTION  11/28/97   pacesetter--ERI 2011  . SUPRAPUBIC PROSTATECTOMY    . TEE WITHOUT CARDIOVERSION N/A 06/12/2015   Procedure: TRANSESOPHAGEAL ECHOCARDIOGRAM (TEE);  Surgeon: Sherren Mocha, MD;  Location: [place];  Service: Open Heart Surgery;  Laterality: N/A;  . TRANSCATHETER AORTIC VALVE REPLACEMENT, TRANSFEMORAL N/A 06/12/2015   Procedure: TRANSCATHETER AORTIC VALVE REPLACEMENT, TRANSFEMORAL, possible transpical;  Surgeon: Sherren Mocha, MD;  Location: [place];  Service: Open Heart Surgery;  Laterality: N/A;     Prior to Admission medications   Medication Sig Start Date End Date Taking? Authorizing Provider  acetaminophen (TYLENOL) 650 MG CR tablet Take 650 mg by mouth 2 (two) times daily.   Yes  [provider]  aspirin 81 MG tablet Take 81 mg by mouth daily.   Yes [provider]  metoprolol tartrate (LOPRESSOR) 25 MG tablet TAKE 1 TABLET BY MOUTH TWICE DAILY 04/13/17  Yes Baldwin Jamaica, PA-C  nitroGLYCERIN (NITROSTAT) 0.4 MG SL tablet Place 0.4 mg under the tongue every 5 (five) minutes as needed for chest pain.   Yes [provider]  Omega-3 Fatty Acids (FISH OIL) 1200 MG CAPS Take 1,200 mg by mouth every evening.   Yes [provider]  ranitidine (ZANTAC) 150 MG tablet Take 150 mg by mouth 2 (two) times daily.    Yes [provider]  simvastatin (ZOCOR) 80 MG tablet TAKE 1  TABLET BY MOUTH EVERY NIGHT AT BEDTIME 12/08/16  Yes Deboraha Sprang, MD  triamterene-hydrochlorothiazide [facility]) 37.5-25 MG tablet TAKE 1 TABLET BY MOUTH DAILY MONDAY THROUGH FRIDAY 05/19/17  Yes Deboraha Sprang, MD  vitamin C (ASCORBIC ACID) 500 MG tablet Take 500 mg by mouth every evening.   Yes [provider]    Inpatient Medications: Scheduled Meds: . acetaminophen  650 mg Oral BID  . aspirin EC  81 mg Oral Daily  . atorvastatin  40 mg Oral q1800  . enoxaparin (LOVENOX) injection  40 mg Subcutaneous Q24H  . famotidine  20 mg Oral BID  . metoprolol tartrate  25 mg Oral BID  . omega-3 acid ethyl esters  1 g Oral QPM  . triamterene-hydrochlorothiazide  1 tablet Oral Daily   Continuous Infusions:  PRN Meds: acetaminophen, ondansetron (ZOFRAN) IV  Allergies:   No Known Allergies  Social History:   Social History   Socioeconomic History  . Marital status: Widowed    Spouse name: Not on file  . Number of children: 5  . Years of education: 7th  . Highest education level: Not on file  Occupational History  . Occupation: Retired  Scientific laboratory technician  . Financial resource strain: Not on file  . Food insecurity:    Worry: Not on file    Inability: Not on file  . Transportation needs:    Medical: Not on file    Non-medical: Not on file    Tobacco Use  . Smoking status: Former Smoker    Packs/day: 2.00    Years: 48.00    Pack years: 96.00    Types: Cigarettes    Last attempt to quit: 03/18/1995    Years since quitting: 22.4  . Smokeless tobacco: Never Used  Substance and Sexual Activity  . Alcohol use: No    Comment: occ  . Drug use: No  . Sexual activity: Not on file  Lifestyle  . Physical activity:    Days per week: Not on file    Minutes per session: Not on file  . Stress: Not on file  Relationships  . Social connections:    Talks on phone: Not on file    Gets together: Not on file    Attends religious service: Not on file    Active member of club or organization: Not on file    Attends meetings of clubs or organizations: Not on file    Relationship status: Not on file  . Intimate partner violence:    Fear of current or ex partner: Not on file    Emotionally abused: Not on file    Physically abused: Not on file    Forced sexual activity: Not on file  Other Topics Concern  . Not on file  Social History Narrative  . Not on file    Family History:   Family History  Problem Relation Age of Onset  . Colon cancer Father   . Cancer Brother   . Heart Problems Brother   . Heart Problems Sister   . CAD Unknown    Family Status:  Family Status  Relation Name Status  . Father  Deceased at age 43s  . Brother half sibling Deceased  . Sister half sibling Alive  . Mother  Deceased at age 10       childbirth  . MGM  Deceased  . MGF  Deceased  . PGM  Deceased  . PGF  Deceased  . Unknown  (Not Specified)  ROS:  Please see the history of present illness.  All other ROS reviewed and negative.     Physical Exam/Data:   Vitals:   08/26/17 0630 08/26/17 0700 08/26/17 0730 08/26/17 0800  BP: (!) 148/78 (!) 152/72 139/71 (!) 144/61  Pulse: (!) 55 (!) 56 66 (!) 59  Resp: 11 12 15 12   Temp:      TempSrc:      SpO2: 97% 96% 97% 97%   No intake or output data in the 24 hours ending 08/26/17  0929 There were no vitals filed for this visit. There is no height or weight on file to calculate BMI.   General: Well developed, well nourished, NAD Skin: Warm, dry, intact  Head: Normocephalic, atraumatic, clear, moist mucus membranes. Neck: Negative for carotid bruits. No JVD Lungs:Clear to ausculation bilaterally. No wheezes, rales, or rhonchi. Breathing is unlabored. Cardiovascular: RRR with S1 S2. No murmurs, rubs or gallops, or LV heave appreciated. Abdomen: Soft, non-tender, non-distended with normoactive bowel sounds.No obvious abdominal masses. MSK: Strength and tone appear normal for age. 5/5 in all extremities Extremities: No edema. No clubbing or cyanosis. DP/PT pulses 2+ bilaterally Neuro: Alert and oriented. No focal deficits. No facial asymmetry. MAE spontaneously. Psych: Responds to questions appropriately with normal affect.    EKG:  The EKG was personally reviewed and demonstrates: 08/25/2017 NSR HR 82 with known LBBB, comparable to prior tracings from 02/2016  Telemetry:  Telemetry was personally reviewed and demonstrates: NSR HR 61  Relevant CV Studies:  Echo 07/07/2016: Study Conclusions  - Left ventricle: The cavity size was normal. Wall thickness was normal. Systolic function was normal. The estimated ejection fraction was in the range of 55% to 60%. Wall motion was normal; there were no regional wall motion abnormalities. Septal bounce consistent with RV pacing. Doppler parameters are consistent with abnormal left ventricular relaxation (grade 1 diastolic dysfunction). - Aortic valve: Bioprosthetic aortic valve, s/p TAVR. No significant valvular stenosis. There was no significant regurgitation. Mean gradient (S): 17 mm Hg. Valve area (VTI): 1.49 cm^2. - Aorta: Ascending aortic diameter: 38 mm (S). - Ascending aorta: The ascending aorta was mildly dilated. - Mitral valve: Mildly calcified annulus. There was trivial regurgitation. -  Left atrium: The atrium was mildly dilated. - Right ventricle: The cavity size was normal. Pacer wire or catheter noted in right ventricle. Systolic function was normal. - Tricuspid valve: Peak RV-RA gradient (S): 35 mm Hg. - Pulmonary arteries: PA peak pressure: 38 mm Hg (S). - Inferior vena cava: The vessel was normal in size. The respirophasic diameter changes were in the normal range (>= 50%), consistent with normal central venous pressure.  Impressions:  - Normal LV size with EF 55-60%. Septal bounce consistent with RV pacing. Normal RV size and systolic function. Bioprosthetic aortic valve s/p TAVR, the valve was functioning normally. Mild pulmonary hypertension.  Cardiac Cath 05/04/2015: Conclusion    Ost LM lesion, 40% stenosed.  Ost Cx to Prox Cx lesion, 90% stenosed.  Ost LAD to Prox LAD lesion, 75% stenosed.  Ost Ramus lesion, 99% stenosed.  Mid LAD lesion, 50% stenosed.  Prox RCA lesion, 50% stenosed.  Dist RCA lesion, 25% stenosed.  SVG .  Widely patent SVG-diagonal  SVG was injected is normal in caliber.  Widely patent sequential SVG-Ramus and OM2  SVG was injected .  Origin lesion, 100% stenosed.  1. Severe 3 vessel CAD with diffuse calcific LAD stenosis and severe proximal LCx stenosis 2. Calcified RCA with mild diffuse nonobstructive disease  3. S/P aortocoronary bypass with patent LIMA-LAD, SVG-diagonal, and sequential SVG-ramus and OM2. Chronic occlusion of the SVG-right PDA 4. Moderate-severe aortic stenosis with mean gradient 28 mmHg and calculated AVA 1.08 square cm  Continued evaluation for TAVR. Pt clearly has severe aortic stenosis by echo criteria and progressive clinical symptoms.   Laboratory Data:  Chemistry Recent Labs  Lab 08/25/17 2038  NA 141  K 4.2  CL 106  CO2 25  GLUCOSE 89  BUN 18  CREATININE 0.97  CALCIUM 9.4  GFRNONAA >60  GFRAA >60  ANIONGAP 10    Total Protein  Date Value Ref Range Status   08/25/2017 7.3 6.5 - 8.1 g/dL Final   Albumin  Date Value Ref Range Status  08/25/2017 3.8 3.5 - 5.0 g/dL Final   AST  Date Value Ref Range Status  08/25/2017 44 (H) 15 - 41 U/L Final   ALT  Date Value Ref Range Status  08/25/2017 29 17 - 63 U/L Final   Alkaline Phosphatase  Date Value Ref Range Status  08/25/2017 69 38 - 126 U/L Final   Total Bilirubin  Date Value Ref Range Status  08/25/2017 1.1 0.3 - 1.2 mg/dL Final   Hematology Recent Labs  Lab 08/25/17 2038  WBC 3.3*  RBC 4.03*  HGB 13.1  HCT 38.5*  MCV 95.5  MCH 32.5  MCHC 34.0  RDW 12.9  PLT 88*   Cardiac Enzymes Recent Labs  Lab 08/26/17 0550  TROPONINI <0.03    Recent Labs  Lab 08/25/17 2051  TROPIPOC 0.02    BNP Recent Labs  Lab 08/25/17 2038  BNP 121.7*    DDimer No results for input(s): DDIMER in the last 168 hours. TSH:  Lab Results  Component Value Date   TSH 3.20 01/20/2013   Lipids: Lab Results  Component Value Date   CHOL 129 04/26/2015   HDL 39 (L) 04/26/2015   LDLCALC 49 04/26/2015   TRIG 207 (H) 04/26/2015   CHOLHDL 3.3 04/26/2015   HgbA1c: Lab Results  Component Value Date   HGBA1C 5.6 06/08/2015    Radiology/Studies:  Dg Chest 2 View  Result Date: 08/25/2017 CLINICAL DATA:  Pressure-like pain for 2 days, history COPD, hypertension EXAM: CHEST - 2 VIEW COMPARISON:  06/11/2016 FINDINGS: Sequential RIGHT subclavian transvenous pacemaker leads project at RIGHT atrium and RIGHT ventricle unchanged. Normal heart size post CABG and TAVR. Mediastinal contours and pulmonary vascularity normal. Atherosclerotic calcification and mild tortuosity of thoracic aorta. Lungs clear. No pulmonary infiltrate, pleural effusion or pneumothorax. Bones demineralized. IMPRESSION: Post CABG, TAVR and pacemaker. No acute abnormalities. Electronically Signed   By: Lavonia Dana M.D.   On: 08/25/2017 20:32   Assessment and Plan:   1.  Chest pain with known history of CAD s/p CABG in  1997: -Patient presented to [facility] ED on 08/25/2017 for episodic chest pain which he experienced while walking to his mailbox.  He describes the pain as a dull ache however, prior episodes are described as sharp in nature. With the episode yesterday, he had associated diaphoresis and dizziness which was relieved with one sublingual nitroglycerin.  He did not experience the associated symptoms with prior episodes. His pain was rated as 4-5/10 in severity at its worst. Does state this is similar to his symptoms with his prior MI  -Troponin, initial i-STAT troponin 0.02 however, repeat negative at <0.03, continue to trend and monitor -Patient with mild elevation of BNP, 121.7>>> CXR with no acute abnormalities -Stable creatinine, 0.97 -EKG,  NSR HR 82 with known LBBB, comparable to prior tracings from 02/2016 -Continue ASA, statin, beta-blocker -There is a pending echocardiogram scheduled for today 08/26/2017 -Per last cardiac cath 05/04/2015, preoperative for TAVR procedure with known chronic occlusion of SVG to right PDA. There are multiple areas of diffuse disease that could essentially be culprit to his symptoms.  Diurese x 1 with lasix Serial troponins    If negative then lexiscan myovue    2.  Aortic valve disease s/p TAVR: -Followed by Dr. Burt Knack, last seen 07/27/2017 -Followed by serial echocardiogram -Continue ASA  3.  HTN: -Stable, 140/66, 148/63, 144/61 -Continue metoprolol, triamterene-hydrochlorothiazide 37.5-25  4.  HLD: -CHO-129, LDL-49, HDL-39 -Continue simvastatin, lovaza  5.  History of SSS s/p PPM 2011 ([place]): -Stable, patient followed by Dr. [name]  6.  History of paroxysmal atrial fibrillation: -Unclear when this occurred -Per chart review reported numerous nosebleeds on Plavix post TAVR -Per EP note minimal episodes of atrial fib on interrogation, 2018 -CHA2DS2Vasc is 4  7.  AAA: -Known AAA with last CT 05/22/2015 -Measurement at that time 4.1 x 3.8 cm  8.   GERD symptoms: -Continue Pepcid   For questions or updates, please contact [facility] Please consult [email or web address] for contact info under Cardiology/STEMI.   Lyndel Safe NP-C [facility] Pager: [phone] 08/26/2017 9:29 AM   Pt seen and examined   I have reviewed note above by Kathyrn Drown  Pt is an 83 yo who is followe by Drs Radford Pax and [name]  Hx of CAD with remote CABG   Last cath in 2017 showed occluded SVG to RCA    Pt also with history of HTN and AV dz (s/p TAVR) and PAF  Prsents to ED today with chest pressure , diapphoresis with walking   Has not had before   On exam:    NEck JVP is increased LUngs are CTA    Cardiac RRR   No significant murmurs    Abdomen is benign  No hepatomegaly Ext are with triv edema  Initial troponins are negative   EKG with LBBB  Old   Recomm:  Admit  Would r/o for MI with serial troponins   Would give 1 dose of IV lasix    Neck veins appear full  If enzymes neg would set up for Lexiscan myovue; compare to previous for changes

## 2017-08-26 NOTE — H&P (Signed)
History and Physical    [name] POE:[id number] DOB: Sep 09, 1934 DOA: 08/25/2017  PCP: [name], MD  Patient coming from: Home  I have personally briefly reviewed patient's old medical records in [place]  Chief Complaint: CP  HPI: [name] is a 83 y.o. male with medical history significant of CAD s/p CABG, last heart cath 2017.  AVS s/p TAVR 2017.  HTN, remote h/o PAF per records not on anticoagulation, COPD, remote prostate CA s/p prostatectomy, SSS s/p PPM.  Patient presents to the ED with c/o CP.  Initially onset last week for 2 days before resolving.  Returned 2 days ago.  Symptoms associated with SOB.  Seem to be exertional.  Feel similar to prior MI.  Pressure like sensation.  Regarding coronary anatomy: 2017 heart cath:        ED Course: Trop neg.  EKG shows old LBBB.   Review of Systems: As per HPI otherwise 10 point review of systems negative.   Past Medical History:  Diagnosis Date  . AAA (abdominal aortic aneurysm) ([place])    a. Last duplex - 3.8cm 01/2013 - due 01/2014.  [name] Acne rosacea   . Anginal pain ([place]) 1996  . Aortic stenosis, mild    Last echo 05/04/12 +LVH  . Arthritis    shoulders  . Back pain    hx epidural injections  . Baker's cyst    Lt.  [name] CAD (coronary artery disease)    a. s/p MI/PTCA - Cx 1994. b. s/p CABG x5 in 1997. c. Abnl nuc 2003- occ SVG-RCA, patent seq SVG-OM1-dCxOM, patentl LIMA-LAD, patent, SVG-diag. d. Normal nuc 09/2012.  . Cancer ([place])   . Carotid artery disease ([place])    a. Duplex 01/2013: mildly abnormal. Mild plaque without significant diameter reduction. Repeat recommended 11/15.  [name] COPD (chronic obstructive pulmonary disease) ([place])   . Dizziness and giddiness 08/04/2013  . GERD (gastroesophageal reflux disease)   . H/O prostate cancer    a. s/p radical retropubic prostatectomy with bilateral pelvic lymph node dissection  . H/O: GI bleed    mild, neg. colonoscopy  . HOH (hard of hearing)   .  HTN (hypertension)   . Hyperlipidemia   . Hypertensive heart disease   . OSA treated with BiPAP 11/20/2015   Severe with [employer] 38/hr  . Pacemaker -[place] with change out 2006  . PAF (paroxysmal atrial fibrillation) ([place])    a. Remote per records.   . Pneumonia    history  . PVD (peripheral vascular disease) ([place])    a. Rt ext iliac stenosis, last PV cath 2003, moderate stenosis last Lower Ext Dopplers 12/16/11 - Rt. ABI 0.96  Lt ABI 1.0.  . Sick sinus syndrome ([place])    a. placed 1999, gen change 2011 - [employer].  . Sinus node dysfunction ([place]) 01/20/2013  . Syncope 11/28/97  . Thrombocytopenia (H[facility])    chronic, mild  . Valvular heart disease    a. Echo 04/2012: mild-mod AS, mild AI, mild MR.    Past Surgical History:  Procedure Laterality Date  . CARDIAC CATHETERIZATION  2003   VG to RCA occluded, other grafts patent  . CARDIAC CATHETERIZATION N/A 05/04/2015   Procedure: Right/Left Heart Cath and Coronary/Graft Angiography;  Surgeon: Sherren Mocha, MD;  Location: [facility];  Service: Cardiovascular;  Laterality: N/A;  . CATARACT EXTRACTION Bilateral    with lens  . cataracts    . COLONOSCOPY    .  CORONARY ANGIOPLASTY  03/29/92   PTCA to LCX  . CORONARY ARTERY BYPASS GRAFT  1997   LIMA-LAD; VG-diag; seq VG-1st OM & distal LCX; VG-RCA  . PACEMAKER GENERATOR CHANGE  08/15/2009   [employer] accent  . PACEMAKER INSERTION  11/28/97   pacesetter--ERI 2011  . SUPRAPUBIC PROSTATECTOMY    . TEE WITHOUT CARDIOVERSION N/A 06/12/2015   Procedure: TRANSESOPHAGEAL ECHOCARDIOGRAM (TEE);  Surgeon: Sherren Mocha, MD;  Location: [facility];  Service: Open Heart Surgery;  Laterality: N/A;  . TRANSCATHETER AORTIC VALVE REPLACEMENT, TRANSFEMORAL N/A 06/12/2015   Procedure: TRANSCATHETER AORTIC VALVE REPLACEMENT, TRANSFEMORAL, possible transpical;  Surgeon: Sherren Mocha, MD;  Location: [facility];  Service: Open Heart Surgery;  Laterality: N/A;     reports that he quit smoking about 22 years  ago. His smoking use included cigarettes. He has a 96.00 pack-year smoking history. He has never used smokeless tobacco. He reports that he does not drink alcohol or use drugs.  No Known Allergies  Family History  Problem Relation Age of Onset  . Colon cancer Father   . Cancer Brother   . Heart Problems Brother   . Heart Problems Sister   . CAD Unknown      Prior to Admission medications   Medication Sig Start Date End Date Taking? Authorizing Provider  acetaminophen (TYLENOL) 650 MG CR tablet Take 650 mg by mouth 2 (two) times daily.   Yes [provider]  aspirin 81 MG tablet Take 81 mg by mouth daily.   Yes [provider]  metoprolol tartrate (LOPRESSOR) 25 MG tablet TAKE 1 TABLET BY MOUTH TWICE DAILY 04/13/17  Yes Baldwin Jamaica, PA-C  nitroGLYCERIN (NITROSTAT) 0.4 MG SL tablet Place 0.4 mg under the tongue every 5 (five) minutes as needed for chest pain.   Yes [provider]  Omega-3 Fatty Acids (FISH OIL) 1200 MG CAPS Take 1,200 mg by mouth every evening.   Yes [provider]  ranitidine (ZANTAC) 150 MG tablet Take 150 mg by mouth 2 (two) times daily.    Yes [provider]  simvastatin (ZOCOR) 80 MG tablet TAKE 1 TABLET BY MOUTH EVERY NIGHT AT BEDTIME 12/08/16  Yes Deboraha Sprang, MD  triamterene-hydrochlorothiazide (GYIRSWN-46) 37.5-25 MG tablet TAKE 1 TABLET BY MOUTH DAILY MONDAY THROUGH FRIDAY 05/19/17  Yes Deboraha Sprang, MD  vitamin C (ASCORBIC ACID) 500 MG tablet Take 500 mg by mouth every evening.   Yes [provider]    Physical Exam: Vitals:   08/25/17 1925 08/25/17 1930 08/25/17 1942 08/26/17 0021  BP: (!) 162/89 (!) 176/85 (!) 176/85 (!) 159/43  Pulse: 85 79 (!) 59 (!) 55  Resp: 16 (!) 21 18 13   Temp: 98.8 F (37.1 C)  98.4 F (36.9 C)   TempSrc: Oral  Oral   SpO2: 95% 96% 100% 94%    Constitutional: NAD, calm, comfortable Eyes: PERRL, lids and conjunctivae normal ENMT: Mucous membranes are moist.  Posterior pharynx clear of any exudate or lesions.Normal dentition.  Neck: normal, supple, no masses, no thyromegaly Respiratory: clear to auscultation bilaterally, no wheezing, no crackles. Normal respiratory effort. No accessory muscle use.  Cardiovascular: Regular rate and rhythm, no murmurs / rubs / gallops. No extremity edema. 2+ pedal pulses. No carotid bruits.  Abdomen: no tenderness, no masses palpated. No hepatosplenomegaly. Bowel sounds positive.  Musculoskeletal: no clubbing / cyanosis. No joint deformity upper and lower extremities. Good ROM, no contractures. Normal muscle tone.  Skin: no rashes, lesions, ulcers. No induration  Neurologic: CN 2-12 grossly intact. Sensation intact, DTR normal. Strength 5/5 in all 4.  Psychiatric: Normal judgment and insight. Alert and oriented x 3. Normal mood.    Labs on Admission: I have personally reviewed following labs and imaging studies  CBC: Recent Labs  Lab 08/25/17 2038  WBC 3.3*  NEUTROABS 1.6*  HGB 13.1  HCT 38.5*  MCV 95.5  PLT 88*   Basic Metabolic Panel: Recent Labs  Lab 08/25/17 2038  NA 141  K 4.2  CL 106  CO2 25  GLUCOSE 89  BUN 18  CREATININE 0.97  CALCIUM 9.4   GFR: CrCl cannot be calculated (Unknown ideal weight.). Liver Function Tests: Recent Labs  Lab 08/25/17 2038  AST 44*  ALT 29  ALKPHOS 69  BILITOT 1.1  PROT 7.3  ALBUMIN 3.8   No results for input(s): LIPASE, AMYLASE in the last 168 hours. No results for input(s): AMMONIA in the last 168 hours. Coagulation Profile: No results for input(s): INR, PROTIME in the last 168 hours. Cardiac Enzymes: No results for input(s): CKTOTAL, CKMB, CKMBINDEX, TROPONINI in the last 168 hours. BNP (last 3 results) No results for input(s): PROBNP in the last 8760 hours. HbA1C: No results for input(s): HGBA1C in the last 72 hours. CBG: No results for input(s): GLUCAP in the last 168 hours. Lipid Profile: No results for input(s): CHOL, HDL, LDLCALC, TRIG,  CHOLHDL, LDLDIRECT in the last 72 hours. Thyroid Function Tests: No results for input(s): TSH, T4TOTAL, FREET4, T3FREE, THYROIDAB in the last 72 hours. Anemia Panel: No results for input(s): VITAMINB12, FOLATE, FERRITIN, TIBC, IRON, RETICCTPCT in the last 72 hours. Urine analysis:    Component Value Date/Time   COLORURINE YELLOW 06/08/2015 1056   APPEARANCEUR CLEAR 06/08/2015 1056   LABSPEC 1.013 06/08/2015 1056   PHURINE 7.5 06/08/2015 1056   GLUCOSEU NEGATIVE 06/08/2015 1056   HGBUR TRACE (A) 06/08/2015 1056   BILIRUBINUR NEGATIVE 06/08/2015 1056   KETONESUR NEGATIVE 06/08/2015 1056   PROTEINUR NEGATIVE 06/08/2015 1056   UROBILINOGEN 0.2 09/26/2009 1906   NITRITE NEGATIVE 06/08/2015 1056   LEUKOCYTESUR NEGATIVE 06/08/2015 1056    Radiological Exams on Admission: Dg Chest 2 View  Result Date: 08/25/2017 CLINICAL DATA:  Pressure-like pain for 2 days, history COPD, hypertension EXAM: CHEST - 2 VIEW COMPARISON:  06/11/2016 FINDINGS: Sequential RIGHT subclavian transvenous pacemaker leads project at RIGHT atrium and RIGHT ventricle unchanged. Normal heart size post CABG and TAVR. Mediastinal contours and pulmonary vascularity normal. Atherosclerotic calcification and mild tortuosity of thoracic aorta. Lungs clear. No pulmonary infiltrate, pleural effusion or pneumothorax. Bones demineralized. IMPRESSION: Post CABG, TAVR and pacemaker. No acute abnormalities. Electronically Signed   By: Lavonia Dana M.D.   On: 08/25/2017 20:32    EKG: Independently reviewed.  Assessment/Plan Principal Problem:   Chest pain, rule out acute myocardial infarction Active Problems:   PAF (paroxysmal atrial fibrillation) (HCC)   CAD (coronary artery disease) - status post CABG in 1997; known occluded SVG-RCA; negative Myoview July 2014   Pacemaker -[employer]   AAA (abdominal aortic aneurysm) ([place])   S/P TAVR (transcatheter aortic valve replacement)   Essential hypertension    1. CP r/o - 1. CP obs  pathway 2. Serial trops 3. Tele monitor 4. NPO after MN 5. Cards eval in AM 2. HTN - continue home BP meds 3. H/o CAD - 1. Continue ASA 81 4. PAF - remote, per records 1. Looks like they just have him on ASA 81 5. AAA - 1. 3.7cm as of  Nov 2018 2. Next f/u US needed in Nov 2019 6. AVS - S/P TAVR 1. Actually due for 2d echo so will go ahead and get this ordered for hospital stay (see cards 07/27/17 office note)  DVT prophylaxis: Lovenox Code Status: Full Family Communication: No family in room Disposition Plan: Home after admit Consults called: Message sent to P. Trent for cards eval in AM Admission status: Place in [place], [facility] Pager [phone] Only works nights!  If 7AM-7PM, please contact the primary day team physician taking care of patient  [email or web address] Password [facility]  08/26/2017, 12:32 AM

## 2017-08-27 ENCOUNTER — Ambulatory Visit (HOSPITAL_COMMUNITY)
Admit: 2017-08-27 | Discharge: 2017-08-27 | Disposition: A | Discharge status: Home / Self Care | Payer: Medicare Other | Attending: Internal Medicine | Admitting: Internal Medicine

## 2017-08-27 DIAGNOSIS — R079 Chest pain, unspecified: Secondary | ICD-10-CM

## 2017-08-27 DIAGNOSIS — I714 Abdominal aortic aneurysm, without rupture: Secondary | ICD-10-CM | POA: Diagnosis not present

## 2017-08-27 DIAGNOSIS — Z951 Presence of aortocoronary bypass graft: Secondary | ICD-10-CM | POA: Diagnosis not present

## 2017-08-27 DIAGNOSIS — R42 Dizziness and giddiness: Secondary | ICD-10-CM

## 2017-08-27 DIAGNOSIS — R05 Cough: Secondary | ICD-10-CM

## 2017-08-27 DIAGNOSIS — I10 Essential (primary) hypertension: Secondary | ICD-10-CM | POA: Diagnosis not present

## 2017-08-27 DIAGNOSIS — I25110 Atherosclerotic heart disease of native coronary artery with unstable angina pectoris: Secondary | ICD-10-CM | POA: Diagnosis not present

## 2017-08-27 DIAGNOSIS — I2510 Atherosclerotic heart disease of native coronary artery without angina pectoris: Secondary | ICD-10-CM | POA: Insufficient documentation

## 2017-08-27 LAB — NM MYOCAR MULTI W/SPECT W/WALL MOTION / EF
CSEPPHR: 73 {beats}/min
Rest HR: 58 {beats}/min

## 2017-08-27 MED ORDER — REGADENOSON 0.4 MG/5ML IV SOLN
INTRAVENOUS | Status: AC
  Administered 2017-08-27: 0.4 mg via INTRAVENOUS
  Filled 2017-08-27: qty 5

## 2017-08-27 MED ORDER — REGADENOSON 0.4 MG/5ML IV SOLN
0.4000 mg | Freq: Once | INTRAVENOUS | Status: AC
  Administered 2017-08-27: 0.4 mg via INTRAVENOUS

## 2017-08-27 MED ORDER — SODIUM CHLORIDE 0.9 % IV SOLN
INTRAVENOUS | Status: DC
  Administered 2017-08-28: 06:00:00 via INTRAVENOUS

## 2017-08-27 MED ORDER — TECHNETIUM TC 99M TETROFOSMIN IV KIT
30.0000 | PACK | Freq: Once | INTRAVENOUS | Status: AC | PRN
  Administered 2017-08-27: 30 via INTRAVENOUS

## 2017-08-27 MED ORDER — ASPIRIN 81 MG PO CHEW
81.0000 mg | CHEWABLE_TABLET | ORAL | Status: AC
  Administered 2017-08-28: 81 mg via ORAL
  Filled 2017-08-27: qty 1

## 2017-08-27 MED ORDER — TECHNETIUM TC 99M TETROFOSMIN IV KIT
10.0000 | PACK | Freq: Once | INTRAVENOUS | Status: AC | PRN
  Administered 2017-08-27: 10 via INTRAVENOUS

## 2017-08-27 NOTE — Progress Notes (Signed)
PROGRESS NOTE  Lessie Manigo FUX:[id number] DOB: 1934-09-16 DOA: 08/25/2017 PCP: [name], MD  HPI/Recap of past 24 hours: Mr. Anaya is a 83 year old male with past medical history significant for previous MI, coronary artery disease status post CABG, TAVR in 2017, hypertension, COPD, paroxysmal A. fib not on anticoagulation, remote prostate cancer status post prostatectomy, sick sinus syndrome status post pacemaker presented to the ED with complaints of chest pain associated with diaphoresis and dyspnea on exertion.  Patient stated chest pain was similar to previous MI.  At the time of this evaluation, the patient chest pain has resolved.  Cardiology has been consulted and is highly appreciated.   08/27/17: seen and examined at bedside. No new complaints. Denies chest pain, palpitations or dyspnea at rest.  2D echo done on 08/26/17 revealed drop in LVEF from 55-60% to 45-50%. Stress test completed. Possible Left heart cath tomorrow.   Assessment/Plan: Principal Problem:   Chest pain, rule out acute myocardial infarction Active Problems:   PAF (paroxysmal atrial fibrillation) (HCC)   CAD (coronary artery disease) - status post CABG in 1997; known occluded SVG-RCA; negative Myoview July 2014   Pacemaker -[employer]   AAA (abdominal aortic aneurysm) ([place])   S/P TAVR (transcatheter aortic valve replacement)   Essential hypertension  Chest pain  ACS suspected Worsening LVEF Stress test done. Awaiting results Possible left heart cath Cardiology following  CAD s/p CABG C/w cardiac meds  Hx of SSS s/p PPM Noted  AAA Last CT in 2017 4 x 3.8 cm Control BP  Aortic valve disease s/p TAVR Cardiology following  HTN BP is well controlled C/w home meds  GERD C/w pepcid   Code Status: Full  Family Communication: None at bedside  Disposition Plan: Home when clinically stable   Consultants:  cardiology  Procedures:  Stress  test  Antimicrobials:  none  DVT prophylaxis:  sq lovenox   Objective: Vitals:   08/27/17 0150 08/27/17 0559 08/27/17 1416 08/27/17 2010  BP: 114/63 132/76 116/66 132/74  Pulse: (!) 57 (!) 54 (!) 57 65  Resp: 18 16 16 20   Temp: 98.3 F (36.8 C) 98.8 F (37.1 C) 98.4 F (36.9 C) 98.2 F (36.8 C)  TempSrc: Oral Oral Oral Oral  SpO2: 94% 94% 96% 95%  Weight:      Height:       No intake or output data in the 24 hours ending 08/27/17 2114 Filed Weights   08/26/17 2052  Weight: 87.3 kg (192 lb 7.4 oz)    Exam:  . General: 83 y.o. year-old male well developed well nourished in no acute distress.  Alert and oriented x3. . Cardiovascular: Regular rate and rhythm with no rubs or gallops.  No thyromegaly or JVD noted.   [name] Respiratory: Clear to auscultation with no wheezes or rales. Good inspiratory effort. . Abdomen: Soft nontender nondistended with normal bowel sounds x4 quadrants. . Musculoskeletal: No lower extremity edema. 2/4 pulses in all 4 extremities. . Skin: No ulcerative lesions noted or rashes . Psychiatry: Mood is appropriate for condition and setting   Data Reviewed: CBC: Recent Labs  Lab 08/25/17 2038  WBC 3.3*  NEUTROABS 1.6*  HGB 13.1  HCT 38.5*  MCV 95.5  PLT 88*   Basic Metabolic Panel: Recent Labs  Lab 08/25/17 2038  NA 141  K 4.2  CL 106  CO2 25  GLUCOSE 89  BUN 18  CREATININE 0.97  CALCIUM 9.4   GFR: Estimated Creatinine Clearance: 61.8 mL/min (by C-G  formula based on SCr of 0.97 mg/dL). Liver Function Tests: Recent Labs  Lab 08/25/17 2038  AST 44*  ALT 29  ALKPHOS 69  BILITOT 1.1  PROT 7.3  ALBUMIN 3.8   No results for input(s): LIPASE, AMYLASE in the last 168 hours. No results for input(s): AMMONIA in the last 168 hours. Coagulation Profile: No results for input(s): INR, PROTIME in the last 168 hours. Cardiac Enzymes: Recent Labs  Lab 08/26/17 0550 08/26/17 1608  TROPONINI <0.03 <0.03   BNP (last 3 results) No  results for input(s): PROBNP in the last 8760 hours. HbA1C: No results for input(s): HGBA1C in the last 72 hours. CBG: No results for input(s): GLUCAP in the last 168 hours. Lipid Profile: No results for input(s): CHOL, HDL, LDLCALC, TRIG, CHOLHDL, LDLDIRECT in the last 72 hours. Thyroid Function Tests: No results for input(s): TSH, T4TOTAL, FREET4, T3FREE, THYROIDAB in the last 72 hours. Anemia Panel: No results for input(s): VITAMINB12, FOLATE, FERRITIN, TIBC, IRON, RETICCTPCT in the last 72 hours. Urine analysis:    Component Value Date/Time   COLORURINE YELLOW 06/08/2015 [place] 06/08/2015 1056   LABSPEC 1.013 06/08/2015 1056   PHURINE 7.5 06/08/2015 1056   GLUCOSEU NEGATIVE 06/08/2015 1056   HGBUR TRACE (A) 06/08/2015 1056   BILIRUBINUR NEGATIVE 06/08/2015 1056   KETONESUR NEGATIVE 06/08/2015 1056   PROTEINUR NEGATIVE 06/08/2015 1056   UROBILINOGEN 0.2 09/26/2009 1906   NITRITE NEGATIVE 06/08/2015 1056   LEUKOCYTESUR NEGATIVE 06/08/2015 1056   Sepsis Labs: @LABRCNTIP (procalcitonin:4,lacticidven:4)  )No results found for this or any previous visit (from the past 240 hour(s)).    Studies: Nm Myocar Multi W/spect W/wall Motion / Ef  Result Date: 08/27/2017 CLINICAL DATA:  Coronary artery disease, prior CABG EXAM: MYOCARDIAL IMAGING WITH SPECT (REST AND PHARMACOLOGIC-STRESS) GATED LEFT VENTRICULAR WALL MOTION STUDY LEFT VENTRICULAR EJECTION FRACTION TECHNIQUE: Standard myocardial SPECT imaging was performed after resting intravenous injection of 10 mCi Tc-41m tetrofosmin. Subsequently, intravenous infusion of Lexiscan was performed under the supervision of the Cardiology staff. At peak effect of the drug, 30 mCi Tc-71m tetrofosmin was injected intravenously and standard myocardial SPECT imaging was performed. Quantitative gated imaging was also performed to evaluate left ventricular wall motion, and estimate left ventricular ejection fraction. COMPARISON:  None.  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 There appears to be a small area of mild reversibility in the lateral wall. Wall Motion: Decreased wall motion in the septum. Left Ventricular Ejection Fraction: 47 % End diastolic volume 062 ml End systolic volume 72 ml IMPRESSION: 1. 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 Adjacent small area of reversibility in the lateral wall concerning for small area of inducible ischemia. 2. Decreased wall motion in the septum. 3. Left ventricular ejection fraction 47% 4. Non invasive risk stratification*: Intermediate *2012 Appropriate Use Criteria for Coronary Revascularization Focused Update: J Am Coll Cardiol. 6948;54(6):270-350. http://content.airportbarriers.com.aspx?articleid=[email or web address] Electronically Signed   By: Rolm Baptise M.D.   On: 08/27/2017 12:16    Scheduled Meds: . acetaminophen  650 mg Oral BID  . aspirin EC  81 mg Oral Daily  . atorvastatin  40 mg Oral q1800  . enoxaparin (LOVENOX) injection  40 mg Subcutaneous Q24H  . famotidine  20 mg Oral BID  . metoprolol tartrate  25 mg Oral BID  . omega-3 acid ethyl esters  1 g Oral QPM  . triamterene-hydrochlorothiazide  1 tablet Oral Daily    Continuous Infusions:  LOS: 0 days     Kayleen Memos, MD [facility] Pager [phone]  If 7PM-7AM, please contact night-coverage [email or web address] Password [facility] 08/27/2017, 9:14 PM

## 2017-08-27 NOTE — Progress Notes (Addendum)
Progress Note  Patient Name: [name] Date of Encounter: 08/27/2017  Primary Cardiologist: Dr. Fransico Him, MD  Subjective   Patient feeling well today.  Denies chest pain or palpitations.  Awaiting Lexiscan stress test today 08/27/17.   Inpatient Medications    Scheduled Meds: . acetaminophen  650 mg Oral BID  . aspirin EC  81 mg Oral Daily  . atorvastatin  40 mg Oral q1800  . enoxaparin (LOVENOX) injection  40 mg Subcutaneous Q24H  . famotidine  20 mg Oral BID  . metoprolol tartrate  25 mg Oral BID  . omega-3 acid ethyl esters  1 g Oral QPM  . triamterene-hydrochlorothiazide  1 tablet Oral Daily   Continuous Infusions:  PRN Meds: acetaminophen, ondansetron (ZOFRAN) IV   Vital Signs    Vitals:   08/26/17 1702 08/26/17 2052 08/27/17 0150 08/27/17 0559  BP: (!) 143/80 (!) 128/91 114/63 132/76  Pulse: (!) 58 93 (!) 57 (!) 54  Resp:  20 18 16   Temp: 98.7 F (37.1 C) 97.8 F (36.6 C) 98.3 F (36.8 C) 98.8 F (37.1 C)  TempSrc: Oral Oral Oral Oral  SpO2: 97% 94% 94% 94%  Weight:  192 lb 7.4 oz (87.3 kg)    Height:  5\' 6"  (1.676 m)      Intake/Output Summary (Last 24 hours) at 08/27/2017 0733 Last data filed at 08/26/2017 1800 Gross per 24 hour  Intake 480 ml  Output -  Net 480 ml   Filed Weights   08/26/17 2052  Weight: 192 lb 7.4 oz (87.3 kg)   Physical Exam   General: Well developed, well nourished, NAD Skin: Warm, dry, intact  Head: Normocephalic, atraumatic, clear, moist mucus membranes. Neck: Negative for carotid bruits. No JVD Lungs:Clear to ausculation bilaterally. No wheezes, rales, or rhonchi. Breathing is unlabored. Cardiovascular: RRR with S1 S2. No murmurs, rubs, gallops, or LV heave appreciated. Abdomen: Soft, non-tender, non-distended with normoactive bowel sounds.  No obvious abdominal masses. MSK: Strength and tone appear normal for age. 5/5 in all extremities Extremities: No edema. No clubbing or cyanosis. DP/PT pulses 2+  bilaterally Neuro: Alert and oriented. No focal deficits. No facial asymmetry. MAE spontaneously. Psych: Responds to questions appropriately with normal affect.    Labs    Chemistry Recent Labs  Lab 08/25/17 2038  NA 141  K 4.2  CL 106  CO2 25  GLUCOSE 89  BUN 18  CREATININE 0.97  CALCIUM 9.4  PROT 7.3  ALBUMIN 3.8  AST 44*  ALT 29  ALKPHOS 69  BILITOT 1.1  GFRNONAA >60  GFRAA >60  ANIONGAP 10    Hematology Recent Labs  Lab 08/25/17 2038  WBC 3.3*  RBC 4.03*  HGB 13.1  HCT 38.5*  MCV 95.5  MCH 32.5  MCHC 34.0  RDW 12.9  PLT 88*   Cardiac Enzymes Recent Labs  Lab 08/26/17 0550 08/26/17 1608  TROPONINI <0.03 <0.03    Recent Labs  Lab 08/25/17 2051  TROPIPOC 0.02    BNP Recent Labs  Lab 08/25/17 2038  BNP 121.7*    Radiology    Dg Chest 2 View  Result Date: 08/25/2017 CLINICAL DATA:  Pressure-like pain for 2 days, history COPD, hypertension EXAM: CHEST - 2 VIEW COMPARISON:  06/11/2016 FINDINGS: Sequential RIGHT subclavian transvenous pacemaker leads project at RIGHT atrium and RIGHT ventricle unchanged. Normal heart size post CABG and TAVR. Mediastinal contours and pulmonary vascularity normal. Atherosclerotic calcification and mild tortuosity of thoracic aorta. Lungs clear. No  pulmonary infiltrate, pleural effusion or pneumothorax. Bones demineralized. IMPRESSION: Post CABG, TAVR and pacemaker. No acute abnormalities. Electronically Signed   By: Lavonia Dana M.D.   On: 08/25/2017 20:32   Telemetry    08/27/17 NSR with LBBB HR 57 - Personally Reviewed  ECG     No new tracing as of 08/27/2017- Personally Reviewed  Cardiac Studies   Echocardiogram 08/26/2017:  Study Conclusions  - Left ventricle: The cavity size was normal. There was mild   concentric hypertrophy. Systolic function was mildly reduced. The   estimated ejection fraction was in the range of 45% to 50%.   Hypokinesis of the inferoseptal myocardium; consistent with   ischemia in  the distribution of the right coronary artery.   Hypokinesis of the apicalanteroseptal myocardium; consistent with   ischemia in the distribution of the left anterior descending   coronary artery; new since the study of 07/07/2016. Features are   consistent with a pseudonormal left ventricular filling pattern,   with concomitant abnormal relaxation and increased filling   pressure (grade 2 diastolic dysfunction). - Ventricular septum: Septal motion showed abnormal function and   dyssynergy. These changes are consistent with right ventricular   pacing. - Aortic valve: A bioprosthesis was present and functioning   normally. Valve area (VTI): 1.79 cm^2. Valve area (Vmax): 1.63   cm^2. Valve area (Vmean): 1.54 cm^2. - Mitral valve: Calcified annulus. - Left atrium: The atrium was mildly dilated. - Pulmonary arteries: PA peak pressure: 38 mm Hg (S).  Impressions:  - Left ventricular systolic function has worsened since April 2018.   Due to technical quality of the images and profound   pacing-induced dyssynchrony, wall motion analysis is challenging.   Nevertheless, there appears to be a new inferoseptal and   apicoseptal wall motion abnormality.  Echo 07/07/2016: Study Conclusions  - Left ventricle: The cavity size was normal. Wall thickness was normal. Systolic function was normal. The estimated ejection fraction was in the range of 55% to 60%. Wall motion was normal; there were no regional wall motion abnormalities. Septal bounce consistent with RV pacing. Doppler parameters are consistent with abnormal left ventricular relaxation (grade 1 diastolic dysfunction). - Aortic valve: Bioprosthetic aortic valve, s/p TAVR. No significant valvular stenosis. There was no significant regurgitation. Mean gradient (S): 17 mm Hg. Valve area (VTI): 1.49 cm^2. - Aorta: Ascending aortic diameter: 38 mm (S). - Ascending aorta: The ascending aorta was mildly dilated. -  Mitral valve: Mildly calcified annulus. There was trivial regurgitation. - Left atrium: The atrium was mildly dilated. - Right ventricle: The cavity size was normal. Pacer wire or catheter noted in right ventricle. Systolic function was normal. - Tricuspid valve: Peak RV-RA gradient (S): 35 mm Hg. - Pulmonary arteries: PA peak pressure: 38 mm Hg (S). - Inferior vena cava: The vessel was normal in size. The respirophasic diameter changes were in the normal range (>= 50%), consistent with normal central venous pressure.  Impressions:  - Normal LV size with EF 55-60%. Septal bounce consistent with RV pacing. Normal RV size and systolic function. Bioprosthetic aortic valve s/p TAVR, the valve was functioning normally. Mild pulmonary hypertension.  Cardiac Cath 05/04/2015: Conclusion    Ost LM lesion, 40% stenosed.  Ost Cx to Prox Cx lesion, 90% stenosed.  Ost LAD to Prox LAD lesion, 75% stenosed.  Ost Ramus lesion, 99% stenosed.  Mid LAD lesion, 50% stenosed.  Prox RCA lesion, 50% stenosed.  Dist RCA lesion, 25% stenosed.  SVG .  Widely patent SVG-diagonal  SVG was injected is normal in caliber.  Widely patent sequential SVG-Ramus and OM2  SVG was injected .  Origin lesion, 100% stenosed.  1. Severe 3 vessel CAD with diffuse calcific LAD stenosis and severe proximal LCx stenosis 2. Calcified RCA with mild diffuse nonobstructive disease 3. S/P aortocoronary bypass with patent LIMA-LAD, SVG-diagonal, and sequential SVG-ramus and OM2. Chronic occlusion of the SVG-right PDA 4. Moderate-severe aortic stenosis with mean gradient 28 mmHg and calculated AVA 1.08 square cm  Continued evaluation for TAVR. Pt clearly has severe aortic stenosis by echo criteria and progressive clinical symptoms.   Patient Profile     83 y.o. male with a hx of CAD s/p PTCA and CABG (1994, 1995) now with chronic occlusion of SVG to RCA per cardiac cath 2017 (see below), AVS  s/p TAVR 2017, HTN, remote history of PAF not on anticoagulation secondary to nosebleeds, COPD, sleep apnea on nocturnal BiPAP, remote prostate CA s/p prostatectomy and SSS s/p PPM who is being seen today for the evaluation of chest pain at the request of Dr. Alcario Drought.  Assessment & Plan    1.  Chest pain with known history of CAD s/p CABG in 1997: -Patient presented to [facility], ED on 08/25/2017 for episodic chest pain which he experienced while walking to his mailbox.  He described the pain as a dull ache however, prior episodes were described as sharp in nature similar to his prior MI symptoms  Trop neg for MI    -EKG, NSR HR 82 LBBB, comparable to prior tracings from 02/2016 -Continue ASA, statin, beta-blocker -Per last cardiac cath 05/04/2015, preoperative for TAVR procedure with known chronic occlusion of SVG to right PDA. There are multiple areas of diffuse disease that could essentially be culprit to his symptoms -Echocardiogram on 08/26/2017 revealed worsening LVEF since April 2018. Per report, there appears to be a new inferoseptal and apicoseptal wall motion abnormality -Given his negative troponin, he is scheduled for a Lexiscan stress test today 08/27/2017 for further evaluation  2.  Aortic valve disease s/p TAVR: -Followed by Dr. Burt Knack, last seen 07/27/2017 Echo today shows valve functioning well   -Continue ASA  3.  HTN: -Stable, 132/76, 114/63, 128/91 -Continue metoprolol, triamterene-hydrochlorothiazide 37.5-25  4.  HLD: -CHO-129, LDL-49, HDL-39 -Continue simvastatin, lovaza  5.  History of SSS s/p PPM 2011 ([facility]): -Stable, patient followed by Dr. [name]  6.  History of paroxysmal atrial fibrillation: -Unclear when this occurred -Per chart review reported numerous nosebleeds on Plavix post TAVR -Per EP note minimal episodes of atrial fib on interrogation, 2018 -CHA2DS2Vasc is 4  7.  AAA: -Known AAA with last CT 05/22/2015 -Measurement at that time 4.1 x  3.8 cm  8.  GERD symptoms: -Continue Pepcid   Signed, Kathyrn Drown NP-C [facility] Pager: [phone] 08/27/2017, 7:33 AM    Pt seen and examined   I agree with findings as noted above by Tonny Branch  ON exam, pt in NAD Lungs are CTA   Cardiac RRR  No s3  Ext without edema  I have reviewed echo;    LVEF is down from previous  Nuclear scan is not normal   Decreased perfusion inf septal wall is new/prominent   There is some inferolateral ischemia  With decline in LVEF, change in nuclear scan and symptoms would recomm L heart cath to redefine  Pt is 25 years post CABG  Pt understands   Agrees to proceed.    Dorris Carnes   For questions or updates,  please contact   Please consult [email or web address] for contact info under Cardiology/STEMI.

## 2017-08-27 NOTE — Progress Notes (Signed)
    Patient presented for Lexiscan nuclear stress test. Tolerated procedure well. Pending final stress imaging result.  Daune Perch, AGNP-C 08/27/2017  9:57 AM Pager: [phone]

## 2017-08-27 NOTE — H&P (View-Only) (Signed)
Progress Note  Patient Name: [name] Date of Encounter: 08/27/2017  Primary Cardiologist: Dr. Fransico Him, MD  Subjective   Patient feeling well today.  Denies chest pain or palpitations.  Awaiting Lexiscan stress test today 08/27/17.   Inpatient Medications    Scheduled Meds: . acetaminophen  650 mg Oral BID  . aspirin EC  81 mg Oral Daily  . atorvastatin  40 mg Oral q1800  . enoxaparin (LOVENOX) injection  40 mg Subcutaneous Q24H  . famotidine  20 mg Oral BID  . metoprolol tartrate  25 mg Oral BID  . omega-3 acid ethyl esters  1 g Oral QPM  . triamterene-hydrochlorothiazide  1 tablet Oral Daily   Continuous Infusions:  PRN Meds: acetaminophen, ondansetron (ZOFRAN) IV   Vital Signs    Vitals:   08/26/17 1702 08/26/17 2052 08/27/17 0150 08/27/17 0559  BP: (!) 143/80 (!) 128/91 114/63 132/76  Pulse: (!) 58 93 (!) 57 (!) 54  Resp:  20 18 16   Temp: 98.7 F (37.1 C) 97.8 F (36.6 C) 98.3 F (36.8 C) 98.8 F (37.1 C)  TempSrc: Oral Oral Oral Oral  SpO2: 97% 94% 94% 94%  Weight:  192 lb 7.4 oz (87.3 kg)    Height:  5\' 6"  (1.676 m)      Intake/Output Summary (Last 24 hours) at 08/27/2017 0733 Last data filed at 08/26/2017 1800 Gross per 24 hour  Intake 480 ml  Output -  Net 480 ml   Filed Weights   08/26/17 2052  Weight: 192 lb 7.4 oz (87.3 kg)   Physical Exam   General: Well developed, well nourished, NAD Skin: Warm, dry, intact  Head: Normocephalic, atraumatic, clear, moist mucus membranes. Neck: Negative for carotid bruits. No JVD Lungs:Clear to ausculation bilaterally. No wheezes, rales, or rhonchi. Breathing is unlabored. Cardiovascular: RRR with S1 S2. No murmurs, rubs, gallops, or LV heave appreciated. Abdomen: Soft, non-tender, non-distended with normoactive bowel sounds.  No obvious abdominal masses. MSK: Strength and tone appear normal for age. 5/5 in all extremities Extremities: No edema. No clubbing or cyanosis. DP/PT pulses 2+  bilaterally Neuro: Alert and oriented. No focal deficits. No facial asymmetry. MAE spontaneously. Psych: Responds to questions appropriately with normal affect.    Labs    Chemistry Recent Labs  Lab 08/25/17 2038  NA 141  K 4.2  CL 106  CO2 25  GLUCOSE 89  BUN 18  CREATININE 0.97  CALCIUM 9.4  PROT 7.3  ALBUMIN 3.8  AST 44*  ALT 29  ALKPHOS 69  BILITOT 1.1  GFRNONAA >60  GFRAA >60  ANIONGAP 10    Hematology Recent Labs  Lab 08/25/17 2038  WBC 3.3*  RBC 4.03*  HGB 13.1  HCT 38.5*  MCV 95.5  MCH 32.5  MCHC 34.0  RDW 12.9  PLT 88*   Cardiac Enzymes Recent Labs  Lab 08/26/17 0550 08/26/17 1608  TROPONINI <0.03 <0.03    Recent Labs  Lab 08/25/17 2051  TROPIPOC 0.02    BNP Recent Labs  Lab 08/25/17 2038  BNP 121.7*    Radiology    Dg Chest 2 View  Result Date: 08/25/2017 CLINICAL DATA:  Pressure-like pain for 2 days, history COPD, hypertension EXAM: CHEST - 2 VIEW COMPARISON:  06/11/2016 FINDINGS: Sequential RIGHT subclavian transvenous pacemaker leads project at RIGHT atrium and RIGHT ventricle unchanged. Normal heart size post CABG and TAVR. Mediastinal contours and pulmonary vascularity normal. Atherosclerotic calcification and mild tortuosity of thoracic aorta. Lungs clear. No  pulmonary infiltrate, pleural effusion or pneumothorax. Bones demineralized. IMPRESSION: Post CABG, TAVR and pacemaker. No acute abnormalities. Electronically Signed   By: Lavonia Dana M.D.   On: 08/25/2017 20:32   Telemetry    08/27/17 NSR with LBBB HR 57 - Personally Reviewed  ECG     No new tracing as of 08/27/2017- Personally Reviewed  Cardiac Studies   Echocardiogram 08/26/2017:  Study Conclusions  - Left ventricle: The cavity size was normal. There was mild   concentric hypertrophy. Systolic function was mildly reduced. The   estimated ejection fraction was in the range of 45% to 50%.   Hypokinesis of the inferoseptal myocardium; consistent with   ischemia in  the distribution of the right coronary artery.   Hypokinesis of the apicalanteroseptal myocardium; consistent with   ischemia in the distribution of the left anterior descending   coronary artery; new since the study of 07/07/2016. Features are   consistent with a pseudonormal left ventricular filling pattern,   with concomitant abnormal relaxation and increased filling   pressure (grade 2 diastolic dysfunction). - Ventricular septum: Septal motion showed abnormal function and   dyssynergy. These changes are consistent with right ventricular   pacing. - Aortic valve: A bioprosthesis was present and functioning   normally. Valve area (VTI): 1.79 cm^2. Valve area (Vmax): 1.63   cm^2. Valve area (Vmean): 1.54 cm^2. - Mitral valve: Calcified annulus. - Left atrium: The atrium was mildly dilated. - Pulmonary arteries: PA peak pressure: 38 mm Hg (S).  Impressions:  - Left ventricular systolic function has worsened since April 2018.   Due to technical quality of the images and profound   pacing-induced dyssynchrony, wall motion analysis is challenging.   Nevertheless, there appears to be a new inferoseptal and   apicoseptal wall motion abnormality.  Echo 07/07/2016: Study Conclusions  - Left ventricle: The cavity size was normal. Wall thickness was normal. Systolic function was normal. The estimated ejection fraction was in the range of 55% to 60%. Wall motion was normal; there were no regional wall motion abnormalities. Septal bounce consistent with RV pacing. Doppler parameters are consistent with abnormal left ventricular relaxation (grade 1 diastolic dysfunction). - Aortic valve: Bioprosthetic aortic valve, s/p TAVR. No significant valvular stenosis. There was no significant regurgitation. Mean gradient (S): 17 mm Hg. Valve area (VTI): 1.49 cm^2. - Aorta: Ascending aortic diameter: 38 mm (S). - Ascending aorta: The ascending aorta was mildly dilated. -  Mitral valve: Mildly calcified annulus. There was trivial regurgitation. - Left atrium: The atrium was mildly dilated. - Right ventricle: The cavity size was normal. Pacer wire or catheter noted in right ventricle. Systolic function was normal. - Tricuspid valve: Peak RV-RA gradient (S): 35 mm Hg. - Pulmonary arteries: PA peak pressure: 38 mm Hg (S). - Inferior vena cava: The vessel was normal in size. The respirophasic diameter changes were in the normal range (>= 50%), consistent with normal central venous pressure.  Impressions:  - Normal LV size with EF 55-60%. Septal bounce consistent with RV pacing. Normal RV size and systolic function. Bioprosthetic aortic valve s/p TAVR, the valve was functioning normally. Mild pulmonary hypertension.  Cardiac Cath 05/04/2015: Conclusion    Ost LM lesion, 40% stenosed.  Ost Cx to Prox Cx lesion, 90% stenosed.  Ost LAD to Prox LAD lesion, 75% stenosed.  Ost Ramus lesion, 99% stenosed.  Mid LAD lesion, 50% stenosed.  Prox RCA lesion, 50% stenosed.  Dist RCA lesion, 25% stenosed.  SVG .  Widely patent SVG-diagonal  SVG was injected is normal in caliber.  Widely patent sequential SVG-Ramus and OM2  SVG was injected .  Origin lesion, 100% stenosed.  1. Severe 3 vessel CAD with diffuse calcific LAD stenosis and severe proximal LCx stenosis 2. Calcified RCA with mild diffuse nonobstructive disease 3. S/P aortocoronary bypass with patent LIMA-LAD, SVG-diagonal, and sequential SVG-ramus and OM2. Chronic occlusion of the SVG-right PDA 4. Moderate-severe aortic stenosis with mean gradient 28 mmHg and calculated AVA 1.08 square cm  Continued evaluation for TAVR. Pt clearly has severe aortic stenosis by echo criteria and progressive clinical symptoms.   Patient Profile     83 y.o. male with a hx of CAD s/p PTCA and CABG (1994, 1995) now with chronic occlusion of SVG to RCA per cardiac cath 2017 (see below), AVS  s/p TAVR 2017, HTN, remote history of PAF not on anticoagulation secondary to nosebleeds, COPD, sleep apnea on nocturnal BiPAP, remote prostate CA s/p prostatectomy and SSS s/p PPM who is being seen today for the evaluation of chest pain at the request of Dr. Alcario Drought.  Assessment & Plan    1.  Chest pain with known history of CAD s/p CABG in 1997: -Patient presented to [facility], ED on 08/25/2017 for episodic chest pain which he experienced while walking to his mailbox.  He described the pain as a dull ache however, prior episodes were described as sharp in nature similar to his prior MI symptoms  Trop neg for MI    -EKG, NSR HR 82 LBBB, comparable to prior tracings from 02/2016 -Continue ASA, statin, beta-blocker -Per last cardiac cath 05/04/2015, preoperative for TAVR procedure with known chronic occlusion of SVG to right PDA. There are multiple areas of diffuse disease that could essentially be culprit to his symptoms -Echocardiogram on 08/26/2017 revealed worsening LVEF since April 2018. Per report, there appears to be a new inferoseptal and apicoseptal wall motion abnormality -Given his negative troponin, he is scheduled for a Lexiscan stress test today 08/27/2017 for further evaluation  2.  Aortic valve disease s/p TAVR: -Followed by Dr. Burt Knack, last seen 07/27/2017 Echo today shows valve functioning well   -Continue ASA  3.  HTN: -Stable, 132/76, 114/63, 128/91 -Continue metoprolol, triamterene-hydrochlorothiazide 37.5-25  4.  HLD: -CHO-129, LDL-49, HDL-39 -Continue simvastatin, lovaza  5.  History of SSS s/p PPM 2011 ([facility]): -Stable, patient followed by Dr. [name]  6.  History of paroxysmal atrial fibrillation: -Unclear when this occurred -Per chart review reported numerous nosebleeds on Plavix post TAVR -Per EP note minimal episodes of atrial fib on interrogation, 2018 -CHA2DS2Vasc is 4  7.  AAA: -Known AAA with last CT 05/22/2015 -Measurement at that time 4.1 x  3.8 cm  8.  GERD symptoms: -Continue Pepcid   Signed, Kathyrn Drown NP-C [facility] Pager: [phone] 08/27/2017, 7:33 AM    Pt seen and examined   I agree with findings as noted above by Tonny Branch  ON exam, pt in NAD Lungs are CTA   Cardiac RRR  No s3  Ext without edema  I have reviewed echo;    LVEF is down from previous  Nuclear scan is not normal   Decreased perfusion inf septal wall is new/prominent   There is some inferolateral ischemia  With decline in LVEF, change in nuclear scan and symptoms would recomm L heart cath to redefine  Pt is 25 years post CABG  Pt understands   Agrees to proceed.    Dorris Carnes   For questions or updates,  please contact   Please consult [email or web address] for contact info under Cardiology/STEMI.

## 2017-08-28 ENCOUNTER — Encounter (HOSPITAL_COMMUNITY): Payer: Self-pay | Admitting: General Practice

## 2017-08-28 ENCOUNTER — Encounter (HOSPITAL_COMMUNITY)
Admission: EM | Disposition: A | Discharge status: Home / Self Care | Payer: Self-pay | Source: Home / Self Care | Attending: Emergency Medicine

## 2017-08-28 DIAGNOSIS — I10 Essential (primary) hypertension: Secondary | ICD-10-CM | POA: Diagnosis not present

## 2017-08-28 DIAGNOSIS — K7011 Alcoholic hepatitis with ascites: Secondary | ICD-10-CM

## 2017-08-28 DIAGNOSIS — I714 Abdominal aortic aneurysm, without rupture: Secondary | ICD-10-CM | POA: Diagnosis not present

## 2017-08-28 DIAGNOSIS — I25110 Atherosclerotic heart disease of native coronary artery with unstable angina pectoris: Principal | ICD-10-CM

## 2017-08-28 DIAGNOSIS — R05 Cough: Secondary | ICD-10-CM | POA: Diagnosis not present

## 2017-08-28 DIAGNOSIS — I2584 Coronary atherosclerosis due to calcified coronary lesion: Secondary | ICD-10-CM | POA: Diagnosis not present

## 2017-08-28 DIAGNOSIS — I25719 Atherosclerosis of autologous vein coronary artery bypass graft(s) with unspecified angina pectoris: Secondary | ICD-10-CM | POA: Diagnosis not present

## 2017-08-28 DIAGNOSIS — R079 Chest pain, unspecified: Secondary | ICD-10-CM | POA: Diagnosis not present

## 2017-08-28 DIAGNOSIS — I2582 Chronic total occlusion of coronary artery: Secondary | ICD-10-CM | POA: Diagnosis not present

## 2017-08-28 DIAGNOSIS — J449 Chronic obstructive pulmonary disease, unspecified: Secondary | ICD-10-CM | POA: Diagnosis not present

## 2017-08-28 HISTORY — DX: Alcoholic hepatitis with ascites: K70.11

## 2017-08-28 HISTORY — PX: LEFT HEART CATH AND CORS/GRAFTS ANGIOGRAPHY: CATH118250

## 2017-08-28 LAB — BASIC METABOLIC PANEL
Anion gap: 8 (ref 5–15)
BUN: 23 mg/dL — ABNORMAL HIGH (ref 6–20)
CALCIUM: 9.5 mg/dL (ref 8.9–10.3)
CO2: 29 mmol/L (ref 22–32)
CREATININE: 1.04 mg/dL (ref 0.61–1.24)
Chloride: 102 mmol/L (ref 101–111)
GFR calc Af Amer: >60 mL/min (ref >60)
GFR calc non Af Amer: >60 mL/min (ref >60)
GLUCOSE: 105 mg/dL — AB (ref 65–99)
Potassium: 3.5 mmol/L (ref 3.5–5.1)
Sodium: 139 mmol/L (ref 135–145)

## 2017-08-28 LAB — CBC
HEMATOCRIT: 38.3 % — AB (ref 39.0–52.0)
Hemoglobin: 13.3 g/dL (ref 13.0–17.0)
MCH: 32.7 pg (ref 26.0–34.0)
MCHC: 34.7 g/dL (ref 30.0–36.0)
MCV: 94.1 fL (ref 78.0–100.0)
PLATELETS: 83 10*3/uL — AB (ref 150–400)
RBC: 4.07 MIL/uL — ABNORMAL LOW (ref 4.22–5.81)
RDW: 12.8 % (ref 11.5–15.5)
WBC: 3.4 10*3/uL — ABNORMAL LOW (ref 4.0–10.5)

## 2017-08-28 SURGERY — LEFT HEART CATH AND CORS/GRAFTS ANGIOGRAPHY
Anesthesia: LOCAL

## 2017-08-28 MED ORDER — SODIUM CHLORIDE 0.9% FLUSH: 3.0000 mL | Freq: Two times a day (BID) | INTRAVENOUS | Status: DC

## 2017-08-28 MED ORDER — LIDOCAINE HCL (PF) 1 % IJ SOLN
INTRAMUSCULAR | Status: AC
  Filled 2017-08-28: qty 30

## 2017-08-28 MED ORDER — LIDOCAINE HCL (PF) 1 % IJ SOLN
INTRAMUSCULAR | Status: DC | PRN
  Administered 2017-08-28: 2 mL via INTRADERMAL

## 2017-08-28 MED ORDER — SODIUM CHLORIDE 0.9 % IV SOLN: INTRAVENOUS | Status: AC

## 2017-08-28 MED ORDER — SODIUM CHLORIDE 0.9% FLUSH: 3.0000 mL | INTRAVENOUS | Status: DC | PRN

## 2017-08-28 MED ORDER — HEPARIN SODIUM (PORCINE) 1000 UNIT/ML IJ SOLN
INTRAMUSCULAR | Status: DC | PRN
  Administered 2017-08-28: 5000 [IU] via INTRAVENOUS

## 2017-08-28 MED ORDER — FENTANYL CITRATE (PF) 100 MCG/2ML IJ SOLN
INTRAMUSCULAR | Status: AC
  Filled 2017-08-28: qty 2

## 2017-08-28 MED ORDER — IOPAMIDOL (ISOVUE-370) INJECTION 76%
INTRAVENOUS | Status: DC | PRN
  Administered 2017-08-28: 120 mL via INTRA_ARTERIAL

## 2017-08-28 MED ORDER — IOPAMIDOL (ISOVUE-370) INJECTION 76%
INTRAVENOUS | Status: AC
  Filled 2017-08-28: qty 150

## 2017-08-28 MED ORDER — CLOPIDOGREL BISULFATE 75 MG PO TABS
300.0000 mg | ORAL_TABLET | Freq: Once | ORAL | Status: AC
  Administered 2017-08-28: 300 mg via ORAL

## 2017-08-28 MED ORDER — HEPARIN (PORCINE) IN NACL 2-0.9 UNITS/ML
INTRAMUSCULAR | Status: DC | PRN
  Administered 2017-08-28: 10 mL via INTRA_ARTERIAL

## 2017-08-28 MED ORDER — VERAPAMIL HCL 2.5 MG/ML IV SOLN
INTRAVENOUS | Status: AC
  Filled 2017-08-28: qty 2

## 2017-08-28 MED ORDER — FENTANYL CITRATE (PF) 100 MCG/2ML IJ SOLN
INTRAMUSCULAR | Status: DC | PRN
  Administered 2017-08-28: 25 ug via INTRAVENOUS

## 2017-08-28 MED ORDER — MIDAZOLAM HCL 2 MG/2ML IJ SOLN
INTRAMUSCULAR | Status: DC | PRN
  Administered 2017-08-28: 1 mg via INTRAVENOUS

## 2017-08-28 MED ORDER — ATORVASTATIN CALCIUM 40 MG PO TABS: 40.0000 mg | ORAL_TABLET | Freq: Every day | ORAL | 0 refills | Status: DC

## 2017-08-28 MED ORDER — CLOPIDOGREL BISULFATE 75 MG PO TABS: 75.0000 mg | ORAL_TABLET | Freq: Every day | ORAL | Status: DC

## 2017-08-28 MED ORDER — CLOPIDOGREL BISULFATE 75 MG PO TABS: 75.0000 mg | ORAL_TABLET | Freq: Every day | ORAL | 0 refills | Status: AC

## 2017-08-28 MED ORDER — CLOPIDOGREL BISULFATE 300 MG PO TABS
ORAL_TABLET | ORAL | Status: AC
  Filled 2017-08-28: qty 1

## 2017-08-28 MED ORDER — HEPARIN (PORCINE) IN NACL 2-0.9 UNITS/ML
INTRAMUSCULAR | Status: AC | PRN
  Administered 2017-08-28: 1000 mL via INTRA_ARTERIAL

## 2017-08-28 MED ORDER — HEPARIN SODIUM (PORCINE) 1000 UNIT/ML IJ SOLN
INTRAMUSCULAR | Status: AC
  Filled 2017-08-28: qty 1

## 2017-08-28 MED ORDER — SODIUM CHLORIDE 0.9 % IV SOLN: 250.0000 mL | INTRAVENOUS | Status: DC | PRN

## 2017-08-28 MED ORDER — MIDAZOLAM HCL 2 MG/2ML IJ SOLN
INTRAMUSCULAR | Status: AC
  Filled 2017-08-28: qty 2

## 2017-08-28 MED ORDER — HEPARIN (PORCINE) IN NACL 1000-0.9 UT/500ML-% IV SOLN
INTRAVENOUS | Status: AC
  Filled 2017-08-28: qty 1000

## 2017-08-28 MED ORDER — ASPIRIN 81 MG PO TBEC: 81.0000 mg | DELAYED_RELEASE_TABLET | Freq: Every day | ORAL | 0 refills | Status: DC

## 2017-08-28 SURGICAL SUPPLY — 10 items
CATH INFINITI 5 FR IM (CATHETERS) ×1 IMPLANT
CATH INFINITI 5FR MULTPACK ANG (CATHETERS) ×1 IMPLANT
DEVICE RAD COMP TR BAND LRG (VASCULAR PRODUCTS) ×1 IMPLANT
GLIDESHEATH SLEND A-KIT 6F 22G (SHEATH) ×1 IMPLANT
GUIDEWIRE INQWIRE 1.5J.035X260 (WIRE) ×1 IMPLANT
INQWIRE 1.5J .035X260CM (WIRE) ×2
KIT HEART LEFT (KITS) ×2 IMPLANT
PACK CARDIAC CATHETERIZATION (CUSTOM PROCEDURE TRAY) ×2 IMPLANT
TRANSDUCER W/STOPCOCK (MISCELLANEOUS) ×2 IMPLANT
TUBING CIL FLEX 10 FLL-RA (TUBING) ×2 IMPLANT

## 2017-08-28 NOTE — Progress Notes (Signed)
Reviewed discharge information with patient and caregiver. Answered all questions. patient able to teach back medications and reasons to contact MD/911. Patient verbalizes understanding of cath lab appointment on 6/18. Reviewed radial precautions. Barbee Shropshire. Brigitte Pulse, RN

## 2017-08-28 NOTE — Discharge Instructions (Signed)
Acute Coronary Syndrome Acute coronary syndrome (ACS) is a serious problem in which there is suddenly not enough blood and oxygen reaching the heart. ACS can result in chest pain or a heart attack. What are the causes? This condition may be caused by:  A buildup of fat and cholesterol inside of the arteries (atherosclerosis). This is the most common cause. The buildup (plaque) can cause the blood vessels in your heart (coronary arteries) to become narrow or blocked. Plaque can also break off to form a clot.  A coronary spasm.  A tearing of the coronary artery (spontaneous coronary artery dissection).  Low blood pressure (hypotension).  An abnormal heart beat (arrhythmia).  Using cocaine or methamphetamine.  What increases the risk? The following factors may make you more likely to develop this condition:  Age.  History of chest pain, heart attack, or stroke.  Being male.  Family history of chest pain, heart disease, or stroke.  Smoking.  Inactivity.  Being overweight.  High cholesterol.  High blood pressure (hypertension).  Diabetes.  Excessive alcohol use.  What are the signs or symptoms? Common symptoms of this condition include:  Chest pain. The pain may last long, or may stop and come back (recur). It may feel like: ? Crushing or squeezing. ? Tightness, pressure, fullness, or heaviness.  Arm, neck, jaw, or back pain.  Heartburn or indigestion.  Shortness of breath.  Nausea.  Sudden cold sweats.  Lightheadedness.  Dizziness.  Tiredness (fatigue).  Sometimes there are no symptoms. How is this diagnosed? This condition may be diagnosed through:  An electrocardiogram (ECG). This test records the impulses of the heart.  Blood tests.  A CT scan of the chest.  A coronary angiogram. This procedure checks for a blockage in the coronary arteries.  How is this treated? Treatment for this condition may include:  Oxygen.  Medicines, such  as: ? Antiplatelet medicines and blood-thinning medicines, such as aspirin. These help prevent blood clots. ? Fibrinolytic therapy. This breaks apart a blood clot. ? Blood pressure medicines. ? Nitroglycerin. ? Pain medicine. ? Cholesterol medicine.  A procedure called coronary angioplasty and stenting. This is done to widen a narrowed artery and keep it open.  Coronary artery bypass surgery. This allows blood to pass the blockage to reach your heart.  Cardiac rehabilitation. This is a program that helps improve your health and well-being. It includes exercise training, education, and counseling to help you recover.  Follow these instructions at home: Eating and drinking  Follow a heart-healthy, low-salt (sodium) diet.  Use healthy cooking methods such as roasting, grilling, broiling, baking, poaching, steaming, or stir-frying.  Talk to a dietitian to learn about healthy cooking methods and how to eat less sodium. Medicines  Take over-the-counter and prescription medicines only as told by your health care provider.  Do not take these medicines unless your health care provider approves: ? Nonsteroidal anti-inflammatory drugs (NSAIDs), such as ibuprofen, naproxen, or celecoxib. ? Vitamin supplements that contain vitamin A or vitamin E. ? Hormone replacement therapy that contains estrogen. Activity  Join a cardiac rehabilitation program.  Ask your health care provider: ? What activities and exercises are safe for you. ? If you should follow specific instructions about lifting, driving, or climbing stairs.  If you are taking aspirin and another blood thinning medicine, avoid activities that are likely to result in an injury. The medicines can increase your risk of bleeding. Lifestyle  Do not use any products that contain nicotine or tobacco, such as  cigarettes and e-cigarettes. If you need help quitting, ask your health care provider.  If you drink alcohol and your health care  provider says it is okay to drink, limit your alcohol intake to no more than 1 drink per day. One drink equals 12 oz of beer, 5 oz of wine, or 1 oz of hard liquor.  Maintain a healthy weight. If you need to lose weight, do it in a way that has been approved by your health care provider. General instructions  Tell all your health care providers about your heart condition, including your dentist. Some medicines can increase your risk of arrhythmia.  Manage other health conditions, such as hypertension and diabetes. These conditions affect your heart.  Learn ways to manage stress.  Get screened for depression, and seek treatment if needed.  Monitor your blood pressure if told by your health care provider.  Keep your vaccinations up to date. Get the annual influenza vaccine.  Keep all follow-up visits as told by your health care provider. This is important. Contact a health care provider if:  You feel overwhelmed or sad.  You have trouble with your daily activities. Get help right away if:  You have pain in your chest, neck, arm, jaw, stomach, or back that recurs, and: ? Lasts more than a few minutes. ? Is not relieved by taking the [place] health care provider prescribed.  You have unexplained: ? Heavy sweating. ? Heartburn or indigestion. ? Shortness of breath. ? Difficulty breathing. ? Nausea or vomiting. ? Fatigue. ? Nervousness or anxiety. ? Weakness. ? Diarrhea. ? Dark stools or blood in the stool.  You have sudden lightheadedness or dizziness.  Your blood pressure is higher than 180/120  You faint.  You feel like hurting yourself or think about taking your own life. These symptoms may represent a serious problem that is an emergency. Do not wait to see if the symptoms will go away. Get medical help right away. Call your local emergency services (911 in the [place] Do not drive yourself to the clinic or hospital. Summary  Acute coronary syndrome (ACS) is a  when there is not enough blood and oxygen being supplied to the heart. ACS can result in chest pain or a heart attack.  Acute coronary syndrome is a medical emergency. If you have any symptoms of this condition, get help right away.  Treatment includes oxygen, medicines, and procedures to open the blocked arteries and restore blood flow. This information is not intended to replace advice given to you by your health care provider. Make sure you discuss any questions you have with your health care provider. Document Released: 03/03/2005 Document Revised: 04/04/2016 Document Reviewed: 04/04/2016 Elsevier Interactive Patient Education  2018 [facility] [place] [facility] OFFICE [address], [place] [address] [place] [address] Dept: [phone] Loc: [phone]    You are scheduled for a Cardiac Catheterization on Tuesday, June 14 with Dr.Varanasi   1. Please arrive at the [facility] (Main Entrance A) at [facility]: [address] [place], [place] [address] at 11:30 AM (two hours before your procedure to ensure your preparation). Free valet parking service is available.   Special note: Every effort is made to have your procedure done on time. Please understand that emergencies sometimes delay scheduled procedures.  2. Diet: Do not eat or drink anything after midnight prior to your procedure except sips of water to take medications.  4. Medication instructions in preparation for your procedure: HOLD THE TRIAMTERENE- HYDROCHLOROTHIAZIDE ON THE MORNING OF YOUR PROCEDURE   On the morning of your procedure, take your Aspirin and plavix and any morning medicines NOT listed above.  You may use sips of water.  5. Plan for one night stay--bring personal belongings. 6. Bring a current list of your medications and current insurance cards. 7. You MUST have a responsible person to drive you home. 8.  Someone MUST be with you the first 24 hours after you arrive home or your discharge will be delayed. 9. Please wear clothes that are easy to get on and off and wear slip-on shoes.  Thank you for allowing [place] to care for you!   -- [facility]

## 2017-08-28 NOTE — Progress Notes (Signed)
Patient arrived back to [facility] in stable condition, no complaints, denies pain. Radial site dressing is CD&I. Patient verbalizes understanding to keep wrist elevated while at rest and weight restrictions. Will proceed with discharge.  Barbee [place]. Brigitte Pulse, RN

## 2017-08-28 NOTE — Progress Notes (Addendum)
Received pt from [facility] via [facility].  Pt alert and oriented X4, skin warm and dry, denies any chest pain at this time,and  IVF of NS infusing and VS stable per [facility] staff upon arrival.Pt placed on beside monitor, call bell in reach, and consent signed .  IV site patent. Pt waiting for cath procedure.

## 2017-08-28 NOTE — Discharge Summary (Signed)
Discharge Summary  Jameir Ake TWS:[id number] DOB: [birth year]  PCP: [name], MD  Admit date: 08/25/2017 Discharge date: 08/28/2017  Time spent: 25 minutes  Recommendations for Outpatient Follow-up:  1. Follow-up with cardiology 2. Follow-up with PCP 3. Return on Tuesday morning 09/01/17 for scheduled atherectomy/PCI with Dr. Irish Lack as recommended by cardiology 4. Take your medications as prescribed  Discharge Diagnoses:  Active Hospital Problems   Diagnosis Date Noted  . Chest pain, rule out acute myocardial infarction 08/26/2017  . Essential hypertension 05/14/2016  . S/P TAVR (transcatheter aortic valve replacement) 06/12/2015  . AAA (abdominal aortic aneurysm) ([facility]) 04/27/2015  . CAD (coronary artery disease) - status post CABG in 1997; known occluded SVG-RCA; negative Myoview July 2014   . Pacemaker -[employer]   . PAF (paroxysmal atrial fibrillation) [facility])     Resolved Hospital Problems   Diagnosis Date Noted Date Resolved  . Alcoholic hepatitis with ascites 08/28/2017 08/28/2017    Discharge Condition: Stable  Diet recommendation: Heart healthy diet  Vitals:   08/28/17 1215 08/28/17 1220  BP: 130/66 (!) 143/71  Pulse: 82 80  Resp: 15 13  Temp:    SpO2: 97% 96%    History of present illness:  Mr. Lax is a 83 year old male with past medical history significant for previous MI, coronary artery disease status post CABG, TAVR in 2017, hypertension, COPD, paroxysmal A. fib not on anticoagulation, remote prostate cancer status post prostatectomy, sick sinus syndrome status post pacemaker presented to the ED with complaints of chest pain associated with diaphoresis and dyspnea on exertion. Patient stated chest pain wassimilar to previous MI.  At the time of this evaluation,the patient chest pain has resolved. Cardiology has been consulted and is highly appreciated.   08/27/17: seen and examined at bedside. No new complaints. Denies chest pain,  palpitations or dyspnea at rest.  2D echo done on 08/26/17 revealed drop in LVEF from 55-60% to 45-50%. Stress test completed. Possible Left heart cath tomorrow.  08/28/17: Patient was seen and examined at his bedside.  He has no new complaints.  He denies chest pain, dyspnea, or palpitation.  He is scheduled for a left heart cath today at [facility].  Cath showed progression of native coronary disease with now 90 to 95% calcified ostial-proximal RCA stenosis as well as progression of disease in the LAD and circumflex. Known occlusion of SVG-RCA. Widely patent LIMA-LAD, SVG-1st Diag, SeqSVG-RI-OM 3 Normal LVEDP  On the day of discharge, the patient was hemodynamically stable.  He will need to follow-up with cardiology and PCP post hospitalization.  Hospital Course:  Principal Problem:   Chest pain, rule out acute myocardial infarction Active Problems:   PAF (paroxysmal atrial fibrillation) ([facility])   CAD (coronary artery disease) - status post CABG in 1997; known occluded SVG-RCA; negative Myoview July 2014   Pacemaker -[employer]   AAA (abdominal aortic aneurysm) ([facility])   S/P TAVR (transcatheter aortic valve replacement)   Essential hypertension  Chest pain  ACS suspected Worsening LVEF from 55-60% to 45 to 50% Stress test done.  Left heart cath planned today with findings as stated above. Cardiology following Return on Tuesday for possible PCI as stated above  Systolic CHF Last 2D echo revealed LVEF 45 to 50% Continue Lopressor, aspirin, Lipitor  Hypertension Blood pressures well controlled Continue Lopressor, triamterene HCTZ  CAD s/p CABG C/w cardiac meds On aspirin, Lipitor  Hx of SSS s/p PPM Noted  AAA Last CT in 2017 4 x 3.8 cm Control  BP  Aortic valve disease s/p TAVR Cardiology following  HTN BP is well controlled C/w home meds  GERD C/w pepcid  Consultants:  cardiology  Procedures:  Stress test  Left heart cath planned on  08/28/2017  Antimicrobials:  none    Discharge Exam: BP (!) 143/71   Pulse 80   Temp 98.7 F (37.1 C) (Oral)   Resp 13   Ht 5\' 6"  (1.676 m)   Wt 87.3 kg (192 lb 7.4 oz)   SpO2 96%   BMI 31.06 kg/m  . General: 83 y.o. year-old male well developed well nourished in no acute distress.  Alert and oriented x3. . Cardiovascular: Regular rate and rhythm with no rubs or gallops.  No thyromegaly or JVD noted.   [name] Respiratory: Clear to auscultation with no wheezes or rales. Good inspiratory effort. . Abdomen: Soft nontender nondistended with normal bowel sounds x4 quadrants. . Musculoskeletal: No lower extremity edema. 2/4 pulses in all 4 extremities. . Skin: No ulcerative lesions noted or rashes . Psychiatry: Mood is appropriate for condition and setting  Discharge Instructions You were cared for by a hospitalist during your hospital stay. If you have any questions about your discharge medications or the care you received while you were in the hospital after you are discharged, you can call the unit and asked to speak with the hospitalist on call if the hospitalist that took care of you is not available. Once you are discharged, your primary care physician will handle any further medical issues. Please note that NO REFILLS for any discharge medications will be authorized once you are discharged, as it is imperative that you return to your primary care physician (or establish a relationship with a primary care physician if you do not have one) for your aftercare needs so that they can reassess your need for medications and monitor your lab values.   Allergies as of 08/28/2017   No Known Allergies     Medication List    STOP taking these medications   acetaminophen 650 MG CR tablet Commonly known as:  TYLENOL   aspirin 81 MG tablet Replaced by:  aspirin 81 MG EC tablet   simvastatin 80 MG tablet Commonly known as:  ZOCOR Replaced by:  atorvastatin 40 MG tablet     TAKE these  medications   aspirin 81 MG EC tablet Take 1 tablet (81 mg total) by mouth daily. Replaces:  aspirin 81 MG tablet   atorvastatin 40 MG tablet Commonly known as:  LIPITOR Take 1 tablet (40 mg total) by mouth daily at 6 PM. Replaces:  simvastatin 80 MG tablet   clopidogrel 75 MG tablet Commonly known as:  PLAVIX Take 1 tablet (75 mg total) by mouth daily.   Fish Oil 1200 MG Caps Take 1,200 mg by mouth every evening.   metoprolol tartrate 25 MG tablet Commonly known as:  LOPRESSOR TAKE 1 TABLET BY MOUTH TWICE DAILY   nitroGLYCERIN 0.4 MG SL tablet Commonly known as:  NITROSTAT Place 0.4 mg under the tongue every 5 (five) minutes as needed for chest pain.   ranitidine 150 MG tablet Commonly known as:  ZANTAC Take 150 mg by mouth 2 (two) times daily.   triamterene-hydrochlorothiazide 37.5-25 MG tablet Commonly known as:  MAXZIDE-25 TAKE 1 TABLET BY MOUTH DAILY MONDAY THROUGH FRIDAY   vitamin C 500 MG tablet Commonly known as:  ASCORBIC ACID Take 500 mg by mouth every evening.      No Known Allergies Follow-up Information  [place]. Go on 09/01/2017.   Why:  Please arrive at 11:30am for 1:30pm case (see instruction for details) Contact information: [place] [phone]       [name], MD. Call in 1 day(s).   Specialty:  Family Medicine Why:  please call for appointment. Contact information: [place] [address] [phone]        Sueanne Margarita, MD. Call in 1 day(s).   Specialty:  Cardiology Why:  please call for an appointment. Contact information: [address] [place] [address] [phone]            The results of significant diagnostics from this hospitalization (including imaging, microbiology, ancillary and laboratory) are listed below for reference.    Significant Diagnostic Studies: Dg Chest 2 View  Result Date:  08/25/2017 CLINICAL DATA:  Pressure-like pain for 2 days, history COPD, hypertension EXAM: CHEST - 2 VIEW COMPARISON:  06/11/2016 FINDINGS: Sequential RIGHT subclavian transvenous pacemaker leads project at RIGHT atrium and RIGHT ventricle unchanged. Normal heart size post CABG and TAVR. Mediastinal contours and pulmonary vascularity normal. Atherosclerotic calcification and mild tortuosity of thoracic aorta. Lungs clear. No pulmonary infiltrate, pleural effusion or pneumothorax. Bones demineralized. IMPRESSION: Post CABG, TAVR and pacemaker. No acute abnormalities. Electronically Signed   By: Lavonia Dana M.D.   On: 08/25/2017 20:32   Nm Myocar Multi W/spect W/wall Motion / Ef  Result Date: 08/27/2017 CLINICAL DATA:  Coronary artery disease, prior CABG EXAM: MYOCARDIAL IMAGING WITH SPECT (REST AND PHARMACOLOGIC-STRESS) GATED LEFT VENTRICULAR WALL MOTION STUDY LEFT VENTRICULAR EJECTION FRACTION TECHNIQUE: Standard myocardial SPECT imaging was performed after resting intravenous injection of 10 mCi Tc-82m tetrofosmin. Subsequently, intravenous infusion of Lexiscan was performed under the supervision of the Cardiology staff. At peak effect of the drug, 30 mCi Tc-61m tetrofosmin was injected intravenously and standard myocardial SPECT imaging was performed. Quantitative gated imaging was also performed to evaluate left ventricular wall motion, and estimate left ventricular ejection fraction. COMPARISON:  None. 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 There appears to be a small area of mild reversibility in the lateral wall. Wall Motion: Decreased wall motion in the septum. Left Ventricular Ejection Fraction: 47 % End diastolic volume 382 ml End systolic volume 72 ml IMPRESSION: 1. 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 Adjacent small area of reversibility in the lateral wall concerning for small area of  inducible ischemia. 2. Decreased wall motion in the septum. 3. Left ventricular ejection fraction 47% 4. Non invasive risk stratification*: Intermediate *2012 Appropriate Use Criteria for Coronary Revascularization Focused Update: J Am Coll Cardiol. 5053;97(6):734-193. http://content.[email or web address].aspx?articleid=[email or web address] Electronically Signed   By: Rolm Baptise M.D.   On: 08/27/2017 12:16    Microbiology: No results found for this or any previous visit (from the past 240 hour(s)).   Labs: Basic Metabolic Panel: Recent Labs  Lab 08/25/17 2038 08/28/17 0415  NA 141 139  K 4.2 3.5  CL 106 102  CO2 25 29  GLUCOSE 89 105*  BUN 18 23*  CREATININE 0.97 1.04  CALCIUM 9.4 9.5   Liver Function Tests: Recent Labs  Lab 08/25/17 2038  AST 44*  ALT 29  ALKPHOS 69  BILITOT 1.1  PROT 7.3  ALBUMIN 3.8   No results for input(s): LIPASE, AMYLASE in the last 168 hours. No results for input(s): AMMONIA in the last 168 hours.  CBC: Recent Labs  Lab 08/25/17 2038 08/28/17 0415  WBC 3.3* 3.4*  NEUTROABS 1.6*  --   HGB 13.1 13.3  HCT 38.5* 38.3*  MCV 95.5 94.1  PLT 88* 83*   Cardiac Enzymes: Recent Labs  Lab 08/26/17 0550 08/26/17 1608  TROPONINI <0.03 <0.03   BNP: BNP (last 3 results) Recent Labs    08/25/17 2038  BNP 121.7*    ProBNP (last 3 results) No results for input(s): PROBNP in the last 8760 hours.  CBG: No results for input(s): GLUCAP in the last 168 hours.     Signed:  Kayleen Memos, MD [facility] 08/28/2017, 1:04 PM

## 2017-08-28 NOTE — H&P (View-Only) (Signed)
Cath showed progression of native coronary disease with now 90 to 95% calcified ostial-proximal RCA stenosis as well as progression of disease in the LAD and circumflex. Known occlusion of SVG-RCA. Widely patent LIMA-LAD, SVG-1st Diag, SeqSVG-RI-OM 3 Normal LVEDP   Plan:   Patient will be transferred back to [facility] after TR band removal.    Intention will be for him to be discharged home with plan to return on Tuesday morning for scheduled atherectomy/PCI with Dr. Irish Lack.  (need to clarify if OK for CSI vs. Rotablator)  He will be loaded with 300 mg Plavix and discharge on Plavix 75mg  qd and ASA 81mg  qd.   Continue other antianginal medications    See discharge instruction sheet to when to return for outpatient cath.

## 2017-08-28 NOTE — Progress Notes (Signed)
Progress Note  Patient Name: [name] Date of Encounter: 08/28/2017  Primary Cardiologist: Dr. Fransico Him, MD  Subjective   No CP or pressure  Brearthing is OK flat  .   Inpatient Medications    Scheduled Meds: . [MAR Hold] acetaminophen  650 mg Oral BID  . [MAR Hold] aspirin EC  81 mg Oral Daily  . [MAR Hold] atorvastatin  40 mg Oral q1800  . [MAR Hold] enoxaparin (LOVENOX) injection  40 mg Subcutaneous Q24H  . [MAR Hold] famotidine  20 mg Oral BID  . [MAR Hold] metoprolol tartrate  25 mg Oral BID  . [MAR Hold] omega-3 acid ethyl esters  1 g Oral QPM  . sodium chloride flush  3 mL Intravenous Q12H  . [MAR Hold] triamterene-hydrochlorothiazide  1 tablet Oral Daily   Continuous Infusions: . sodium chloride    . sodium chloride 50 mL/hr (08/28/17 0854)   PRN Meds: sodium chloride, [MAR Hold] acetaminophen, [MAR Hold] ondansetron (ZOFRAN) IV, sodium chloride flush   Vital Signs    Vitals:   08/28/17 1016 08/28/17 1021 08/28/17 1026 08/28/17 1030  BP: (!) 146/78 (!) 148/78 (!) 142/83 (!) 153/94  Pulse: 76 85 84 86  Resp: 19 20 18 17   Temp:      TempSrc:      SpO2: 99% 100% 100% 100%  Weight:      Height:        Intake/Output Summary (Last 24 hours) at 08/28/2017 1042 Last data filed at 08/28/2017 0100 Gross per 24 hour  Intake 0 ml  Output -  Net 0 ml   Filed Weights   08/26/17 2052  Weight: 87.3 kg (192 lb 7.4 oz)   Physical Exam   General: Well developed, well nourished, NAD Skin  Erythema around nose face Head: Normocephalic, atraumatic, clear, moist mucus membranes. Neck:  No JVD Lungs:Clear to ausculation bilaterally. No wheezes, rales, or rhonchi. Breathing is unlabored. Cardiovascular: RRR with S1 S2. No murmurs, rubs, gallops, or LV heave appreciated. Abdomen: Soft, non-tender, non-distended with normoactive bowel sounds.  No obvious abdominal masses. MSK: Strength and tone appear normal for age. 5/5 in all extremities Extremities: No  edema. No clubbing or cyanosis. DP/PT pulses 2+ bilaterally Labs    Chemistry Recent Labs  Lab 08/25/17 2038 08/28/17 0415  NA 141 139  K 4.2 3.5  CL 106 102  CO2 25 29  GLUCOSE 89 105*  BUN 18 23*  CREATININE 0.97 1.04  CALCIUM 9.4 9.5  PROT 7.3  --   ALBUMIN 3.8  --   AST 44*  --   ALT 29  --   ALKPHOS 69  --   BILITOT 1.1  --   GFRNONAA >60 >60  GFRAA >60 >60  ANIONGAP 10 8    Hematology Recent Labs  Lab 08/25/17 2038 08/28/17 0415  WBC 3.3* 3.4*  RBC 4.03* 4.07*  HGB 13.1 13.3  HCT 38.5* 38.3*  MCV 95.5 94.1  MCH 32.5 32.7  MCHC 34.0 34.7  RDW 12.9 12.8  PLT 88* 83*   Cardiac Enzymes Recent Labs  Lab 08/26/17 0550 08/26/17 1608  TROPONINI <0.03 <0.03    Recent Labs  Lab 08/25/17 2051  TROPIPOC 0.02    BNP Recent Labs  Lab 08/25/17 2038  BNP 121.7*    Radiology    Nm Myocar Multi W/spect W/wall Motion / Ef  Result Date: 08/27/2017 CLINICAL DATA:  Coronary artery disease, prior CABG EXAM: MYOCARDIAL IMAGING WITH SPECT (REST AND  PHARMACOLOGIC-STRESS) GATED LEFT VENTRICULAR WALL MOTION STUDY LEFT VENTRICULAR EJECTION FRACTION TECHNIQUE: Standard myocardial SPECT imaging was performed after resting intravenous injection of 10 mCi Tc-12m tetrofosmin. Subsequently, intravenous infusion of Lexiscan was performed under the supervision of the Cardiology staff. At peak effect of the drug, 30 mCi Tc-6m tetrofosmin was injected intravenously and standard myocardial SPECT imaging was performed. Quantitative gated imaging was also performed to evaluate left ventricular wall motion, and estimate left ventricular ejection fraction. COMPARISON:  None. 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 There appears to be a small area of mild reversibility in the lateral wall. Wall Motion: Decreased wall motion in the septum. Left Ventricular Ejection Fraction: 47 % End diastolic volume 536 ml End  systolic volume 72 ml IMPRESSION: 1. 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 Adjacent small area of reversibility in the lateral wall concerning for small area of inducible ischemia. 2. Decreased wall motion in the septum. 3. Left ventricular ejection fraction 47% 4. Non invasive risk stratification*: Intermediate *2012 Appropriate Use Criteria for Coronary Revascularization Focused Update: J Am Coll Cardiol. 1443;15(4):008-676. http://content.airportbarriers.com.aspx?articleid=[email or web address] Electronically Signed   By: Rolm Baptise M.D.   On: 08/27/2017 12:16   Telemetry    SR  REvieweed by myself   ECG      Cardiac Studies   Echocardiogram 08/26/2017:  Study Conclusions  - Left ventricle: The cavity size was normal. There was mild   concentric hypertrophy. Systolic function was mildly reduced. The   estimated ejection fraction was in the range of 45% to 50%.   Hypokinesis of the inferoseptal myocardium; consistent with   ischemia in the distribution of the right coronary artery.   Hypokinesis of the apicalanteroseptal myocardium; consistent with   ischemia in the distribution of the left anterior descending   coronary artery; new since the study of 07/07/2016. Features are   consistent with a pseudonormal left ventricular filling pattern,   with concomitant abnormal relaxation and increased filling   pressure (grade 2 diastolic dysfunction). - Ventricular septum: Septal motion showed abnormal function and   dyssynergy. These changes are consistent with right ventricular   pacing. - Aortic valve: A bioprosthesis was present and functioning   normally. Valve area (VTI): 1.79 cm^2. Valve area (Vmax): 1.63   cm^2. Valve area (Vmean): 1.54 cm^2. - Mitral valve: Calcified annulus. - Left atrium: The atrium was mildly dilated. - Pulmonary arteries: PA peak pressure: 38 mm Hg (S).  Impressions:  - Left ventricular systolic function has worsened since  April 2018.   Due to technical quality of the images and profound   pacing-induced dyssynchrony, wall motion analysis is challenging.   Nevertheless, there appears to be a new inferoseptal and   apicoseptal wall motion abnormality.  -  Patient Profile     83 y.o. male with a hx of CAD s/p PTCA and CABG (1994, 1995) now with chronic occlusion of SVG to RCA per cardiac cath 2017 (see below), AVS s/p TAVR 2017, HTN, remote history of PAF not on anticoagulation secondary to nosebleeds, COPD, sleep apnea on nocturnal BiPAP, remote prostate CA s/p prostatectomy and SSS s/p PPM who is being seen today for the evaluation of chest pain at the request of Dr. Alcario Drought.  Assessment & Plan    1.  Chest pain with known history of CAD s/p CABG in 1997: -Patient presented to [facility] ED on 08/25/2017 for episodic chest pain which he experienced while walking  to his mailbox.  Like mule sitting on chest    He described the pain as a dull ache however, prior episodes were described as sharp in nature similar to his prior MI symptoms  Trop neg for MI    -EKG, NSR HR 82 LBBB, comparable to prior tracings from 02/2016 -Echocardiogram on 08/26/2017 revealed worsening LVEF since April 2018. Per report, there appears to be a new inferoseptal and apicoseptal wall motion abnormality Myovue with singif infarct and periinfarct ischemia   THis was different from previous scan  Pt underwent LHC this am   This showed haziness in proximal RCA consistent with decreased flow   Will need athrectomy   (Full report by D harding)    Pt will be set up for procedure on Monday at 7:30  Pt can go home this weekend   He has had no resst symptoms   COme in Monday AM for procedure   Told him to not exert himsel   2.  Aortic valve disease s/p TAVR: -Followed by Dr. Burt Knack, last seen 07/27/2017 Echo today shows valve functioning well   -Continue ASA  3.  HTN: -Fair control   KEep on same meds   4.  HLD: -CHO-129, LDL-49,  HDL-39 -Continue simvastatin, lovaza  5.  History of SSS s/p PPM 2011 ([facility]): -Stable, patient followed by Dr. [name]  6.  History of paroxysmal atrial fibrillation: -Unclear when this occurred -Per chart review reported numerous nosebleeds on Plavix post TAVR -Per EP note minimal episodes of atrial fib on interrogation, 2018 -CHA2DS2Vasc is 4  7.  AAA: -Known AAA with last CT 05/22/2015 -Measurement at that time 4.1 x 3.8 cm  8.  GERD symptoms: -Continue Pepcid   Signed, Kathyrn Drown NP-C [facility] Pager: [phone] 08/28/2017, 10:42 AM    Pt seen and examined   I agree with findings as noted above by Tonny Branch  ON exam, pt in NAD Lungs are CTA   Cardiac RRR  No s3  Ext without edema  I have reviewed echo;    LVEF is down from previous  Nuclear scan is not normal   Decreased perfusion inf septal wall is new/prominent   There is some inferolateral ischemia  With decline in LVEF, change in nuclear scan and symptoms would recomm L heart cath to redefine  Pt is 25 years post CABG  Pt understands   Agrees to proceed.    Dorris Carnes   For questions or updates, please contact   Please consult [email or web address] for contact info under Cardiology/STEMI.

## 2017-08-28 NOTE — Interval H&P Note (Signed)
History and Physical Interval Note:  08/28/2017 9:33 AM  [name]  has presented today for surgery, with the diagnosis of cp- Abnormal Nuclear ST.   The various methods of treatment have been discussed with the patient and family. After consideration of risks, benefits and other options for treatment, the patient has consented to  Procedure(s): LEFT HEART CATH AND CORS/GRAFTS ANGIOGRAPHY (N/A) with possible PERCUTANEOUS CORONARY INTERVENTION as a surgical intervention .  [name] history has been reviewed, patient examined, no change in status, stable for surgery.  I have reviewed the patient's chart and labs.  Questions were answered to the patient's satisfaction.    Cath Lab Visit (complete for each Cath Lab visit)  Clinical Evaluation Leading to the Procedure:   ACS: No.  Non-ACS:    Anginal Classification: CCS III  Anti-ischemic medical therapy: Minimal Therapy (1 class of medications)  Non-Invasive Test Results: Intermediate-risk stress test findings: cardiac mortality 1-3%/year  Prior CABG: Previous CABG  [name]

## 2017-08-28 NOTE — Progress Notes (Signed)
Cath showed progression of native coronary disease with now 90 to 95% calcified ostial-proximal RCA stenosis as well as progression of disease in the LAD and circumflex. Known occlusion of SVG-RCA. Widely patent LIMA-LAD, SVG-1st Diag, SeqSVG-RI-OM 3 Normal LVEDP   Plan:   Patient will be transferred back to [facility] long hospital after TR band removal.    Intention will be for him to be discharged home with plan to return on Tuesday morning for scheduled atherectomy/PCI with Dr. Irish Lack.  (need to clarify if OK for CSI vs. Rotablator)  He will be loaded with 300 mg Plavix and discharge on Plavix 75mg  qd and ASA 81mg  qd.   Continue other antianginal medications    See discharge instruction sheet to when to return for outpatient cath.

## 2017-08-28 NOTE — Progress Notes (Signed)
PROGRESS NOTE  [name] DVV:[id number] DOB: [birth year] DOA: 08/25/2017 PCP: [name], MD  HPI/Recap of past 24 hours: [name] is a 83 year old male with past medical history significant for previous MI, coronary artery disease status post CABG, TAVR in 2017, hypertension, COPD, paroxysmal A. fib not on anticoagulation, remote prostate cancer status post prostatectomy, sick sinus syndrome status post pacemaker presented to the ED with complaints of chest pain associated with diaphoresis and dyspnea on exertion.  Patient stated chest pain was similar to previous MI.  At the time of this evaluation, the patient chest pain has resolved.  Cardiology has been consulted and is highly appreciated.   08/27/17: seen and examined at bedside. No new complaints. Denies chest pain, palpitations or dyspnea at rest.  2D echo done on 08/26/17 revealed drop in LVEF from 55-60% to 45-50%. Stress test completed. Possible Left heart cath tomorrow.  08/28/17: Patient was seen and examined at his bedside.  He has no new complaints.  He denies chest pain, dyspnea, or palpitation.  He is scheduled for a left heart cath today at [facility].   Assessment/Plan: Principal Problem:   Chest pain, rule out acute myocardial infarction Active Problems:   PAF (paroxysmal atrial fibrillation) (HCC)   CAD (coronary artery disease) - status post CABG in 1997; known occluded SVG-RCA; negative Myoview July 2014   Pacemaker -[facility]   AAA (abdominal aortic aneurysm) ([place])   S/P TAVR (transcatheter aortic valve replacement)   Essential hypertension  Chest pain  ACS suspected Worsening LVEF from 55-60% to 45 to 50% Stress test done.  Left heart cath planned today Cardiology following  Systolic CHF Last 2D echo revealed LVEF 45 to 50% Continue Lopressor, aspirin, Lipitor  Hypertension Blood pressures well controlled Continue Lopressor, triamterene HCTZ  CAD s/p CABG C/w cardiac meds On  aspirin, Lipitor  Hx of SSS s/p PPM Noted  AAA Last CT in 2017 4 x 3.8 cm Control BP  Aortic valve disease s/p TAVR Cardiology following  HTN BP is well controlled C/w home meds  GERD C/w pepcid   Code Status: Full  Family Communication: None at bedside  Disposition Plan: Home when clinically stable   Consultants:  cardiology  Procedures:  Stress test  Left heart cath planned on 08/28/2017  Antimicrobials:  none  DVT prophylaxis:  sq lovenox daily   Objective: Vitals:   08/28/17 1016 08/28/17 1021 08/28/17 1026 08/28/17 1030  BP: (!) 146/78 (!) 148/78 (!) 142/83 (!) 153/94  Pulse: 76 85 84 86  Resp: 19 20 18 17   Temp:      TempSrc:      SpO2: 99% 100% 100% 100%  Weight:      Height:        Intake/Output Summary (Last 24 hours) at 08/28/2017 1040 Last data filed at 08/28/2017 0100 Gross per 24 hour  Intake 0 ml  Output -  Net 0 ml   Filed Weights   08/26/17 2052  Weight: 87.3 kg (192 lb 7.4 oz)    Exam:  . General: 83 y.o. year-old male well-developed well-nourished in no acute distress.  Alert and oriented x3.   . Cardiovascular: Regular rate and rhythm with no rubs or gallops.  No thyromegaly or JVD noted. [name] Respiratory: Clear to auscultation with no wheezes or rales. Good inspiratory effort. . Abdomen: Soft nontender nondistended with normal bowel sounds x4 quadrants. . Musculoskeletal: No lower extremity edema. 2/4 pulses in all 4 extremities. . Skin: No ulcerative  lesions noted or rashes . Psychiatry: Mood is appropriate for condition and setting   Data Reviewed: CBC: Recent Labs  Lab 08/25/17 2038 08/28/17 0415  WBC 3.3* 3.4*  NEUTROABS 1.6*  --   HGB 13.1 13.3  HCT 38.5* 38.3*  MCV 95.5 94.1  PLT 88* 83*   Basic Metabolic Panel: Recent Labs  Lab 08/25/17 2038 08/28/17 0415  NA 141 139  K 4.2 3.5  CL 106 102  CO2 25 29  GLUCOSE 89 105*  BUN 18 23*  CREATININE 0.97 1.04  CALCIUM 9.4 9.5   GFR: Estimated  Creatinine Clearance: 57.7 mL/min (by C-G formula based on SCr of 1.04 mg/dL). Liver Function Tests: Recent Labs  Lab 08/25/17 2038  AST 44*  ALT 29  ALKPHOS 69  BILITOT 1.1  PROT 7.3  ALBUMIN 3.8   No results for input(s): LIPASE, AMYLASE in the last 168 hours. No results for input(s): AMMONIA in the last 168 hours. Coagulation Profile: No results for input(s): INR, PROTIME in the last 168 hours. Cardiac Enzymes: Recent Labs  Lab 08/26/17 0550 08/26/17 1608  TROPONINI <0.03 <0.03   BNP (last 3 results) No results for input(s): PROBNP in the last 8760 hours. HbA1C: No results for input(s): HGBA1C in the last 72 hours. CBG: No results for input(s): GLUCAP in the last 168 hours. Lipid Profile: No results for input(s): CHOL, HDL, LDLCALC, TRIG, CHOLHDL, LDLDIRECT in the last 72 hours. Thyroid Function Tests: No results for input(s): TSH, T4TOTAL, FREET4, T3FREE, THYROIDAB in the last 72 hours. Anemia Panel: No results for input(s): VITAMINB12, FOLATE, FERRITIN, TIBC, IRON, RETICCTPCT in the last 72 hours. Urine analysis:    Component Value Date/Time   COLORURINE YELLOW 06/08/2015 [place] 06/08/2015 1056   LABSPEC 1.013 06/08/2015 1056   PHURINE 7.5 06/08/2015 1056   GLUCOSEU NEGATIVE 06/08/2015 1056   HGBUR TRACE (A) 06/08/2015 1056   BILIRUBINUR NEGATIVE 06/08/2015 1056   KETONESUR NEGATIVE 06/08/2015 1056   PROTEINUR NEGATIVE 06/08/2015 1056   UROBILINOGEN 0.2 09/26/2009 1906   NITRITE NEGATIVE 06/08/2015 1056   LEUKOCYTESUR NEGATIVE 06/08/2015 1056   Sepsis Labs: @LABRCNTIP (procalcitonin:4,lacticidven:4)  )No results found for this or any previous visit (from the past 240 hour(s)).    Studies: Nm Myocar Multi W/spect W/wall Motion / Ef  Result Date: 08/27/2017 CLINICAL DATA:  Coronary artery disease, prior CABG EXAM: MYOCARDIAL IMAGING WITH SPECT (REST AND PHARMACOLOGIC-STRESS) GATED LEFT VENTRICULAR WALL MOTION STUDY LEFT VENTRICULAR  EJECTION FRACTION TECHNIQUE: Standard myocardial SPECT imaging was performed after resting intravenous injection of 10 mCi Tc-7m tetrofosmin. Subsequently, intravenous infusion of Lexiscan was performed under the supervision of the Cardiology staff. At peak effect of the drug, 30 mCi Tc-74m tetrofosmin was injected intravenously and standard myocardial SPECT imaging was performed. Quantitative gated imaging was also performed to evaluate left ventricular wall motion, and estimate left ventricular ejection fraction. COMPARISON:  None. 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 There appears to be a small area of mild reversibility in the lateral wall. Wall Motion: Decreased wall motion in the septum. Left Ventricular Ejection Fraction: 47 % End diastolic volume 174 ml End systolic volume 72 ml IMPRESSION: 1. 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 Adjacent small area of reversibility in the lateral wall concerning for small area of inducible ischemia. 2. Decreased wall motion in the septum. 3. Left ventricular ejection fraction 47% 4. Non invasive risk stratification*: Intermediate *2012 Appropriate Use  Criteria for Coronary Revascularization Focused Update: J Am Coll Cardiol. 5997;74(1):423-953. http://content.[email or web address].aspx?[email or web address] Electronically Signed   By: Rolm Baptise M.D.   On: 08/27/2017 12:16    Scheduled Meds: . [MAR Hold] acetaminophen  650 mg Oral BID  . [MAR Hold] aspirin EC  81 mg Oral Daily  . [MAR Hold] atorvastatin  40 mg Oral q1800  . [MAR Hold] enoxaparin (LOVENOX) injection  40 mg Subcutaneous Q24H  . [MAR Hold] famotidine  20 mg Oral BID  . [MAR Hold] metoprolol tartrate  25 mg Oral BID  . [MAR Hold] omega-3 acid ethyl esters  1 g Oral QPM  . sodium chloride flush  3 mL Intravenous Q12H  . [MAR Hold] triamterene-hydrochlorothiazide  1 tablet Oral Daily     Continuous Infusions: . sodium chloride    . sodium chloride 50 mL/hr (08/28/17 0854)  . heparin       LOS: 0 days     Kayleen Memos, MD [facility] Pager [phone]  If 7PM-7AM, please contact night-coverage [email or web address] Password [email or web address] 08/28/2017, 10:40 AM

## 2017-08-28 NOTE — Progress Notes (Signed)
TR BAND REMOVAL  LOCATION:    Radial Lt Radial DEFLATED PER PROTOCOL:   Yes  TIME BAND OFF / DRESSING APPLIED:    Tegaderm and a 2x2 applied at 12:45:00  SITE UPON ARRIVAL:    Level 0 SITE AFTER BAND REMOVAL:    Level 0 CIRCULATION SENSATION AND MOVEMENT: Lt radial 2+. Fingers are warm. No numbness or tingling in hand/fingers. Nail beds are pink.    Within Normal Limits : yes   COMMENTS:   Nurse updated at [facility]. Pt is alert and oriented. Lt radial site is unremarkable.

## 2017-08-31 ENCOUNTER — Telehealth: Payer: Self-pay | Admitting: *Deleted

## 2017-08-31 MED FILL — Heparin Sod (Porcine)-NaCl IV Soln 1000 Unit/500ML-0.9%: INTRAVENOUS | Qty: 1000 | Status: AC

## 2017-08-31 NOTE — Telephone Encounter (Signed)
I spoke with patient to review procedure instructions for tomorrow.  Pt states he did not  start clopidogrel 08/29/17 as instructed, he has a history of nose bleeds and was concerned about taking clopidogrel. I discussed the importance of being able to take clopidogrel pre procedure and continuing clopidogrel post procedure with patient.  Pt states he did not understand the importance of clopidogrel, is willing to get it tonight.  I reviewed with Dr Dicky Doe Dr Manson Allan should take clopidogrel 300 mg tonight, begin clopidogrel 75 mg daily in the morning.  Pt verbalized understanding, will take clopidogrel 300 mg tonight and begin clopidogrel 75 mg daily in the morning, taking a dose before he leaves for the hospital in the morning. He also knows to take ASA 81 mg in the morning before he leaves for the hospital.  Pt is aware that he will need to continue clopidogrel post procedure.  Pt verbalized understanding, thanked me for call.

## 2017-08-31 NOTE — Telephone Encounter (Addendum)
Atherectomy with Percutaneous Intervention scheduled at [facility] for: Tuesday September 01, 2017 1:30 PM Verify arrival time and place: [place] Entrance A at: 11:30 AM  No solid food after midnight prior to cath, clear liquids until 5 AM day of procedure. Verify allergies in Epic Verify no diabetes medications.  Hold: Triamterene-HCTZ AM of procedure  Except hold medications AM meds can be  taken pre-cath with sip of water including: ASA 81 mg Clopidogrel 75 mg  Confirm patient has responsible person to drive home post procedure and observe patient for 24 hours  LMTCB to discuss with patient.

## 2017-09-01 ENCOUNTER — Observation Stay (HOSPITAL_COMMUNITY)
Admission: RE | Admit: 2017-09-01 | Discharge: 2017-09-02 | Disposition: A | Discharge status: Home / Self Care | Payer: Medicare Other | Source: Ambulatory Visit | Attending: Interventional Cardiology | Admitting: Interventional Cardiology

## 2017-09-01 ENCOUNTER — Other Ambulatory Visit: Payer: Self-pay

## 2017-09-01 ENCOUNTER — Encounter (HOSPITAL_COMMUNITY): Payer: Self-pay | Admitting: General Practice

## 2017-09-01 ENCOUNTER — Telehealth: Payer: Self-pay | Admitting: Cardiology

## 2017-09-01 ENCOUNTER — Encounter: Payer: Medicare Other | Admitting: *Deleted

## 2017-09-01 ENCOUNTER — Encounter (HOSPITAL_COMMUNITY)
Admission: RE | Disposition: A | Discharge status: Home / Self Care | Payer: Self-pay | Source: Ambulatory Visit | Attending: Interventional Cardiology

## 2017-09-01 DIAGNOSIS — R079 Chest pain, unspecified: Secondary | ICD-10-CM | POA: Diagnosis present

## 2017-09-01 DIAGNOSIS — I25719 Atherosclerosis of autologous vein coronary artery bypass graft(s) with unspecified angina pectoris: Principal | ICD-10-CM | POA: Insufficient documentation

## 2017-09-01 DIAGNOSIS — I10 Essential (primary) hypertension: Secondary | ICD-10-CM | POA: Insufficient documentation

## 2017-09-01 DIAGNOSIS — J449 Chronic obstructive pulmonary disease, unspecified: Secondary | ICD-10-CM | POA: Insufficient documentation

## 2017-09-01 DIAGNOSIS — I25119 Atherosclerotic heart disease of native coronary artery with unspecified angina pectoris: Secondary | ICD-10-CM | POA: Diagnosis not present

## 2017-09-01 DIAGNOSIS — I2584 Coronary atherosclerosis due to calcified coronary lesion: Secondary | ICD-10-CM | POA: Insufficient documentation

## 2017-09-01 DIAGNOSIS — I2582 Chronic total occlusion of coronary artery: Secondary | ICD-10-CM | POA: Insufficient documentation

## 2017-09-01 HISTORY — DX: Malignant neoplasm of prostate: C61

## 2017-09-01 HISTORY — DX: Cardiac murmur, unspecified: R01.1

## 2017-09-01 HISTORY — PX: CORONARY ATHERECTOMY: CATH118238

## 2017-09-01 HISTORY — DX: Presence of cardiac pacemaker: Z95.0

## 2017-09-01 HISTORY — PX: CARDIAC CATHETERIZATION: SHX172

## 2017-09-01 HISTORY — DX: Acute myocardial infarction, unspecified: I21.9

## 2017-09-01 HISTORY — PX: LEFT HEART CATH: CATH118248

## 2017-09-01 LAB — CBC
HEMATOCRIT: 39.1 % (ref 39.0–52.0)
Hemoglobin: 13.3 g/dL (ref 13.0–17.0)
MCH: 32 pg (ref 26.0–34.0)
MCHC: 34 g/dL (ref 30.0–36.0)
MCV: 94.2 fL (ref 78.0–100.0)
Platelets: 90 10*3/uL — ABNORMAL LOW (ref 150–400)
RBC: 4.15 MIL/uL — AB (ref 4.22–5.81)
RDW: 12.4 % (ref 11.5–15.5)
WBC: 3.8 10*3/uL — AB (ref 4.0–10.5)

## 2017-09-01 LAB — POCT ACTIVATED CLOTTING TIME
ACTIVATED CLOTTING TIME: 301 s
ACTIVATED CLOTTING TIME: 252 s
ACTIVATED CLOTTING TIME: 494 s
ACTIVATED CLOTTING TIME: 169 s
Activated Clotting Time: 301 seconds
Activated Clotting Time: 334 seconds
Activated Clotting Time: 180 seconds

## 2017-09-01 LAB — BASIC METABOLIC PANEL
Anion gap: 7 (ref 5–15)
BUN: 15 mg/dL (ref 6–20)
CHLORIDE: 108 mmol/L (ref 101–111)
CO2: 26 mmol/L (ref 22–32)
Calcium: 9.4 mg/dL (ref 8.9–10.3)
Creatinine, Ser: 1 mg/dL (ref 0.61–1.24)
Glucose, Bld: 89 mg/dL (ref 65–99)
Potassium: 4.2 mmol/L (ref 3.5–5.1)
SODIUM: 141 mmol/L (ref 135–145)

## 2017-09-01 SURGERY — CORONARY ATHERECTOMY
Anesthesia: LOCAL

## 2017-09-01 MED ORDER — SODIUM CHLORIDE 0.9 % IV SOLN: 250.0000 mL | INTRAVENOUS | Status: DC | PRN

## 2017-09-01 MED ORDER — LIDOCAINE HCL (PF) 1 % IJ SOLN
INTRAMUSCULAR | Status: DC | PRN
  Administered 2017-09-01: 2 mL
  Administered 2017-09-01: 15 mL

## 2017-09-01 MED ORDER — FENTANYL CITRATE (PF) 100 MCG/2ML IJ SOLN
INTRAMUSCULAR | Status: AC
  Filled 2017-09-01: qty 2

## 2017-09-01 MED ORDER — HEPARIN (PORCINE) IN NACL 2-0.9 UNITS/ML
INTRAMUSCULAR | Status: AC | PRN
  Administered 2017-09-01: 1000 mL via INTRA_ARTERIAL

## 2017-09-01 MED ORDER — SODIUM CHLORIDE 0.9% FLUSH: 3.0000 mL | Freq: Two times a day (BID) | INTRAVENOUS | Status: DC

## 2017-09-01 MED ORDER — SODIUM CHLORIDE 0.9 % IV SOLN: INTRAVENOUS | Status: AC

## 2017-09-01 MED ORDER — IOPAMIDOL (ISOVUE-370) INJECTION 76%
INTRAVENOUS | Status: AC
  Filled 2017-09-01: qty 125

## 2017-09-01 MED ORDER — SODIUM CHLORIDE 0.9 % WEIGHT BASED INFUSION
3.0000 mL/kg/h | INTRAVENOUS | Status: DC
  Administered 2017-09-01: 3 mL/kg/h via INTRAVENOUS

## 2017-09-01 MED ORDER — CLOPIDOGREL BISULFATE 75 MG PO TABS
75.0000 mg | ORAL_TABLET | Freq: Every day | ORAL | Status: DC
  Administered 2017-09-02: 10:00:00 75 mg via ORAL
  Filled 2017-09-01: qty 1

## 2017-09-01 MED ORDER — CLOPIDOGREL BISULFATE 75 MG PO TABS: 75.0000 mg | ORAL_TABLET | ORAL | Status: DC

## 2017-09-01 MED ORDER — METOPROLOL TARTRATE 25 MG PO TABS
25.0000 mg | ORAL_TABLET | Freq: Two times a day (BID) | ORAL | Status: DC
  Administered 2017-09-01 – 2017-09-02 (×2): 25 mg via ORAL
  Filled 2017-09-01 (×2): qty 1

## 2017-09-01 MED ORDER — SODIUM CHLORIDE 0.9% FLUSH: 3.0000 mL | INTRAVENOUS | Status: DC | PRN

## 2017-09-01 MED ORDER — NITROGLYCERIN 0.4 MG SL SUBL: 0.4000 mg | SUBLINGUAL_TABLET | SUBLINGUAL | Status: DC | PRN

## 2017-09-01 MED ORDER — ONDANSETRON HCL 4 MG/2ML IJ SOLN
4.0000 mg | Freq: Four times a day (QID) | INTRAMUSCULAR | Status: DC | PRN
  Administered 2017-09-01: 22:00:00 4 mg via INTRAVENOUS
  Filled 2017-09-01: qty 2

## 2017-09-01 MED ORDER — HYDRALAZINE HCL 20 MG/ML IJ SOLN
10.0000 mg | INTRAMUSCULAR | Status: DC | PRN
  Administered 2017-09-01: 10 mg via INTRAVENOUS
  Filled 2017-09-01: qty 1

## 2017-09-01 MED ORDER — LIDOCAINE HCL (PF) 1 % IJ SOLN
INTRAMUSCULAR | Status: AC
  Filled 2017-09-01: qty 30

## 2017-09-01 MED ORDER — HEPARIN SODIUM (PORCINE) 1000 UNIT/ML IJ SOLN
INTRAMUSCULAR | Status: DC | PRN
  Administered 2017-09-01: 3000 [IU] via INTRAVENOUS
  Administered 2017-09-01: 8000 [IU] via INTRAVENOUS
  Administered 2017-09-01: 3000 [IU] via INTRAVENOUS

## 2017-09-01 MED ORDER — HEPARIN (PORCINE) IN NACL 2-0.9 UNITS/ML
INTRAMUSCULAR | Status: AC | PRN
  Administered 2017-09-01: 1000 mL

## 2017-09-01 MED ORDER — ASPIRIN EC 81 MG PO TBEC
81.0000 mg | DELAYED_RELEASE_TABLET | Freq: Every day | ORAL | Status: DC
  Administered 2017-09-02: 10:00:00 81 mg via ORAL
  Filled 2017-09-01: qty 1

## 2017-09-01 MED ORDER — MIDAZOLAM HCL 2 MG/2ML IJ SOLN
INTRAMUSCULAR | Status: AC
  Filled 2017-09-01: qty 2

## 2017-09-01 MED ORDER — ASPIRIN 81 MG PO CHEW: 81.0000 mg | CHEWABLE_TABLET | ORAL | Status: DC

## 2017-09-01 MED ORDER — HEPARIN SODIUM (PORCINE) 1000 UNIT/ML IJ SOLN
INTRAMUSCULAR | Status: AC
  Filled 2017-09-01: qty 1

## 2017-09-01 MED ORDER — VIPERSLIDE LUBRICANT OPTIME: TOPICAL | Status: DC | PRN

## 2017-09-01 MED ORDER — SODIUM CHLORIDE 0.9 % WEIGHT BASED INFUSION: 1.0000 mL/kg/h | INTRAVENOUS | Status: DC

## 2017-09-01 MED ORDER — FAMOTIDINE 10 MG PO TABS
10.0000 mg | ORAL_TABLET | Freq: Every day | ORAL | Status: DC
  Administered 2017-09-01: 10 mg via ORAL
  Filled 2017-09-01: qty 1

## 2017-09-01 MED ORDER — ACETAMINOPHEN 325 MG PO TABS: 650.0000 mg | ORAL_TABLET | ORAL | Status: DC | PRN

## 2017-09-01 MED ORDER — MIDAZOLAM HCL 2 MG/2ML IJ SOLN
INTRAMUSCULAR | Status: DC | PRN
  Administered 2017-09-01 (×4): 1 mg via INTRAVENOUS

## 2017-09-01 MED ORDER — ATROPINE SULFATE 1 MG/10ML IJ SOSY
PREFILLED_SYRINGE | INTRAMUSCULAR | Status: AC
  Filled 2017-09-01: qty 10

## 2017-09-01 MED ORDER — VERAPAMIL HCL 2.5 MG/ML IV SOLN
INTRAVENOUS | Status: AC
  Filled 2017-09-01: qty 2

## 2017-09-01 MED ORDER — NITROGLYCERIN 1 MG/10 ML FOR IR/CATH LAB
INTRA_ARTERIAL | Status: AC
  Filled 2017-09-01: qty 10

## 2017-09-01 MED ORDER — HEPARIN (PORCINE) IN NACL 2-0.9 UNITS/ML
INTRAMUSCULAR | Status: DC | PRN
  Administered 2017-09-01: 10 mL via INTRA_ARTERIAL

## 2017-09-01 MED ORDER — HEPARIN (PORCINE) IN NACL 1000-0.9 UT/500ML-% IV SOLN
INTRAVENOUS | Status: AC
  Filled 2017-09-01: qty 1000

## 2017-09-01 MED ORDER — FENTANYL CITRATE (PF) 100 MCG/2ML IJ SOLN
INTRAMUSCULAR | Status: DC | PRN
  Administered 2017-09-01 (×2): 25 ug via INTRAVENOUS

## 2017-09-01 MED ORDER — IOHEXOL 350 MG/ML SOLN
INTRAVENOUS | Status: DC | PRN
  Administered 2017-09-01: 190 mL via INTRAVENOUS

## 2017-09-01 MED ORDER — ATORVASTATIN CALCIUM 40 MG PO TABS
40.0000 mg | ORAL_TABLET | Freq: Every day | ORAL | Status: DC
  Administered 2017-09-01: 40 mg via ORAL
  Filled 2017-09-01: qty 1

## 2017-09-01 MED ORDER — SODIUM CHLORIDE 0.9 % WEIGHT BASED INFUSION: 3.0000 mL/kg/h | INTRAVENOUS | Status: DC

## 2017-09-01 SURGICAL SUPPLY — 32 items
BALLN MINITREK OTW 1.5X12 (BALLOONS) ×2
BALLOON MINITREK OTW 1.5X12 (BALLOONS) IMPLANT
CATH LAUNCHER 6FR 3DRC (CATHETERS) IMPLANT
CATH LAUNCHER 6FR AL.75 (CATHETERS) ×2 IMPLANT
CATH LAUNCHER 6FR AR1 (CATHETERS) ×1 IMPLANT
CATH LAUNCHER 6FR HS (CATHETERS) ×1 IMPLANT
CATH TELEPORT (CATHETERS) ×1 IMPLANT
CATH VISTA GUIDE 7FR JR4 (CATHETERS) ×1 IMPLANT
CATHETER LAUNCHER 6FR 3DRC (CATHETERS) ×2
COVER PRB 48X5XTLSCP FOLD TPE (BAG) IMPLANT
COVER PROBE 5X48 (BAG) ×2
DEVICE RAD COMP TR BAND LRG (VASCULAR PRODUCTS) ×1 IMPLANT
GLIDESHEATH SLEND SS 6F .021 (SHEATH) ×1 IMPLANT
GUIDE CATH MACH 1 7F RC3.5SC (CATHETERS) ×1 IMPLANT
GUIDEWIRE INQWIRE 1.5J.035X260 (WIRE) IMPLANT
INQWIRE 1.5J .035X260CM (WIRE) ×2
KIT ENCORE 26 ADVANTAGE (KITS) ×1 IMPLANT
KIT HEART LEFT (KITS) ×2 IMPLANT
KIT HEMO VALVE WATCHDOG (MISCELLANEOUS) ×1 IMPLANT
LUBRICANT VIPERSLIDE CORONARY (MISCELLANEOUS) ×1 IMPLANT
PACK CARDIAC CATHETERIZATION (CUSTOM PROCEDURE TRAY) ×2 IMPLANT
SHEATH BRITE TIP 7FR 35CM (SHEATH) ×1 IMPLANT
SHEATH PINNACLE 7F 10CM (SHEATH) ×1 IMPLANT
TRANSDUCER W/STOPCOCK (MISCELLANEOUS) ×2 IMPLANT
TUBING CIL FLEX 10 FLL-RA (TUBING) ×2 IMPLANT
WIRE ASAHI FIELDER XT 300CM (WIRE) ×2 IMPLANT
WIRE ASAHI PROWATER 180CM (WIRE) ×1 IMPLANT
WIRE ASAHI PROWATER 300CM (WIRE) ×1 IMPLANT
WIRE EMERALD 3MM-J .035X150CM (WIRE) ×1 IMPLANT
WIRE FIGHTER CROSSING 190CM (WIRE) ×2 IMPLANT
WIRE HI TORQ VERSACORE-J 145CM (WIRE) ×1 IMPLANT
WIRE VIPER ADVANCE COR .012TIP (WIRE) ×1 IMPLANT

## 2017-09-01 NOTE — Telephone Encounter (Signed)
LMOVM reminding pt to send remote transmission.   

## 2017-09-01 NOTE — Interval H&P Note (Signed)
Cath Lab Visit (complete for each Cath Lab visit)  Clinical Evaluation Leading to the Procedure:   ACS: Yes.    Non-ACS:    Anginal Classification: CCS IV  Anti-ischemic medical therapy: Minimal Therapy (1 class of medications)  Non-Invasive Test Results: No non-invasive testing performed  Prior CABG: Previous CABG   Planned CSI of the ostial RCA.  D/w the family.  All questions answered.   History and Physical Interval Note:  09/01/2017 2:32 PM  [name]  has presented today for surgery, with the diagnosis of cad  The various methods of treatment have been discussed with the patient and family. After consideration of risks, benefits and other options for treatment, the patient has consented to  Procedure(s): CORONARY ATHERECTOMY (N/A) as a surgical intervention .  [name] history has been reviewed, patient examined, no change in status, stable for surgery.  I have reviewed the patient's chart and labs.  Questions were answered to the patient's satisfaction.     Larae Grooms

## 2017-09-02 ENCOUNTER — Telehealth: Payer: Self-pay | Admitting: Cardiology

## 2017-09-02 ENCOUNTER — Encounter (HOSPITAL_COMMUNITY): Payer: Self-pay | Admitting: Interventional Cardiology

## 2017-09-02 ENCOUNTER — Encounter: Payer: Self-pay | Admitting: Cardiology

## 2017-09-02 ENCOUNTER — Other Ambulatory Visit: Payer: Self-pay | Admitting: Cardiology

## 2017-09-02 ENCOUNTER — Telehealth: Payer: Self-pay

## 2017-09-02 DIAGNOSIS — I25719 Atherosclerosis of autologous vein coronary artery bypass graft(s) with unspecified angina pectoris: Secondary | ICD-10-CM | POA: Diagnosis not present

## 2017-09-02 DIAGNOSIS — Z79899 Other long term (current) drug therapy: Secondary | ICD-10-CM

## 2017-09-02 DIAGNOSIS — I25110 Atherosclerotic heart disease of native coronary artery with unstable angina pectoris: Secondary | ICD-10-CM

## 2017-09-02 LAB — CBC
HCT: 33.1 % — ABNORMAL LOW (ref 39.0–52.0)
Hemoglobin: 11.1 g/dL — ABNORMAL LOW (ref 13.0–17.0)
MCH: 32.1 pg (ref 26.0–34.0)
MCHC: 33.5 g/dL (ref 30.0–36.0)
MCV: 95.7 fL (ref 78.0–100.0)
PLATELETS: 76 10*3/uL — AB (ref 150–400)
RBC: 3.46 MIL/uL — ABNORMAL LOW (ref 4.22–5.81)
RDW: 12.7 % (ref 11.5–15.5)
WBC: 3.6 10*3/uL — ABNORMAL LOW (ref 4.0–10.5)

## 2017-09-02 LAB — BASIC METABOLIC PANEL
Anion gap: 6 (ref 5–15)
BUN: 17 mg/dL (ref 6–20)
CALCIUM: 8.8 mg/dL — AB (ref 8.9–10.3)
CO2: 27 mmol/L (ref 22–32)
CREATININE: 0.94 mg/dL (ref 0.61–1.24)
Chloride: 107 mmol/L (ref 101–111)
GFR calc Af Amer: >60 mL/min (ref >60)
GLUCOSE: 87 mg/dL (ref 65–99)
Potassium: 4.1 mmol/L (ref 3.5–5.1)
Sodium: 140 mmol/L (ref 135–145)

## 2017-09-02 LAB — MRSA PCR SCREENING: MRSA BY PCR: NEGATIVE

## 2017-09-02 MED FILL — Heparin Sod (Porcine)-NaCl IV Soln 1000 Unit/500ML-0.9%: INTRAVENOUS | Qty: 1000 | Status: AC

## 2017-09-02 MED FILL — Nitroglycerin IV Soln 100 MCG/ML in D5W: INTRA_ARTERIAL | Qty: 10 | Status: AC

## 2017-09-02 NOTE — Progress Notes (Signed)
Site area: right Rt.  groin  Site Prior to Removal:  Level 1  Pressure Applied For 20 MINUTES    Minutes Beginning at 2255  Manual:   Yes.    Patient Status During Pull:  Stable, but patient had vagal response 97mins after sheath was pulled. Pt was nauseous; Zofran given.  Post Pull Groin Site:  Level 1  Post Pull Instructions Given:  Yes.    Post Pull Pulses Present:  Yes.    Dressing Applied:  Yes.    Comments:  Patient stable after sheath pull.

## 2017-09-02 NOTE — Discharge Summary (Addendum)
Discharge Summary    Patient ID: [name],  MRN: [id number], DOB/AGE: [birth year] 83 y.o.  Admit date: 09/01/2017 Discharge date: 09/02/2017  Primary Care Provider: [name] Primary Cardiologist: Dr. Radford Pax   Discharge Diagnoses    Active Problems:   Chest pain with moderate risk for cardiac etiology   Allergies No Known Allergies  Diagnostic Studies/Procedures    Cath: 09/01/17  Conclusion     Ost RCA to Prox RCA lesion is 90% stenosed. Unsuccessful atempt at PCI/coronary atherectomy due to poor guide support.  SVG to RCA occluded.  Origin lesion is 100% stenosed.   Continue medical therapy.    If we retry PCI, would plan on long sheath from the groin due to tortuosity.  Consider Akari 1.0 right guide catheter.  Unable to get guide support from the right radial.  Ned to see what microcatheter would navigate the distal tortuosity.   Probable discharge in AM if labs are ok.   _____________   History of Present Illness     83 y.o. male with a hx of CADs/p PTCAand CABG (1994, 1995)nowwith chronic occlusion of SVG to RCA per cardiac cath 2017(see below),AVSs/p [id number], HTN, remote history of PAF not on anticoagulationsecondary to nosebleeds, COPD, sleep apnea on nocturnal BiPAP, remote prostate CAs/pprostatectomy and SSSs/p PPMwho presented with chest pain on 6/13. Underwent stress testing that was abnormal and sent for cath. Cath done on 6/14 with known occlusion of SVG-RCA, widely patent LIMA-LAD, SVG- 1st diag, Seq SVG-RI-OM3. He was loaded with plavix and planned for staged intervention with atherectomy.    Hospital Course     Underwent cardiac cath on 09/01/17 with Dr. Irish Lack with attempt made at [facility] but unsuccessful 2/2 to poor guide support. Unable to get guide support from right radial cath site. Morning labs stable. No complications noted post cath. He felt well post cath. Will plan to continue with medical therapy at this  time. Of note his platelet count was slightly below baseline (80-90) at 76,000 following cath. Will plan for outpatient labs next week and follow up in the office.   General: Well developed, well nourished, male appearing in no acute distress. Head: Normocephalic, atraumatic.  Neck: Supple without bruits, JVD. Lungs:  Resp regular and unlabored, CTA. Heart: RRR, S1, S2, no S3, S4, or murmur; no rub. Abdomen: Soft, non-tender, non-distended with normoactive bowel sounds.  Extremities: No clubbing, cyanosis, edema. Distal pedal pulses are 2+ bilaterally. R radial cath site stable without bruising or hematoma Neuro: Alert and oriented X 3. Moves all extremities spontaneously. Psych: Normal affect.  [name] was seen by Dr. Angelena Form and determined stable for discharge home. Follow up in the office has been arranged. Medications are listed below.   _____________  Discharge Vitals Blood pressure 119/65, pulse 92, temperature 98.9 F (37.2 C), temperature source Oral, resp. rate 15, height 5\' 6"  (1.676 m), weight 200 lb 13.4 oz (91.1 kg), SpO2 97 %.  Filed Weights   09/01/17 1150 09/02/17 0602  Weight: 195 lb (88.5 kg) 200 lb 13.4 oz (91.1 kg)    Labs & Radiologic Studies    CBC Recent Labs    09/01/17 1223 09/02/17 0647  WBC 3.8* 3.6*  HGB 13.3 11.1*  HCT 39.1 33.1*  MCV 94.2 95.7  PLT 90* 76*   Basic Metabolic Panel Recent Labs    09/01/17 1223 09/02/17 0647  NA 141 140  K 4.2 4.1  CL 108 107  CO2 26 27  GLUCOSE 89  87  BUN 15 17  CREATININE 1.00 0.94  CALCIUM 9.4 8.8*   Liver Function Tests No results for input(s): AST, ALT, ALKPHOS, BILITOT, PROT, ALBUMIN in the last 72 hours. No results for input(s): LIPASE, AMYLASE in the last 72 hours. Cardiac Enzymes No results for input(s): CKTOTAL, CKMB, CKMBINDEX, TROPONINI in the last 72 hours. BNP Invalid input(s): POCBNP D-Dimer No results for input(s): DDIMER in the last 72 hours. Hemoglobin A1C No results for  input(s): HGBA1C in the last 72 hours. Fasting Lipid Panel No results for input(s): CHOL, HDL, LDLCALC, TRIG, CHOLHDL, LDLDIRECT in the last 72 hours. Thyroid Function Tests No results for input(s): TSH, T4TOTAL, T3FREE, THYROIDAB in the last 72 hours.  Invalid input(s): FREET3 _____________  Dg Chest 2 View  Result Date: 08/25/2017 CLINICAL DATA:  Pressure-like pain for 2 days, history COPD, hypertension EXAM: CHEST - 2 VIEW COMPARISON:  06/11/2016 FINDINGS: Sequential RIGHT subclavian transvenous pacemaker leads project at RIGHT atrium and RIGHT ventricle unchanged. Normal heart size post CABG and TAVR. Mediastinal contours and pulmonary vascularity normal. Atherosclerotic calcification and mild tortuosity of thoracic aorta. Lungs clear. No pulmonary infiltrate, pleural effusion or pneumothorax. Bones demineralized. IMPRESSION: Post CABG, TAVR and pacemaker. No acute abnormalities. Electronically Signed   By: Lavonia Dana M.D.   On: 08/25/2017 20:32   Nm Myocar Multi W/spect W/wall Motion / Ef  Result Date: 08/27/2017 CLINICAL DATA:  Coronary artery disease, prior CABG EXAM: MYOCARDIAL IMAGING WITH SPECT (REST AND PHARMACOLOGIC-STRESS) GATED LEFT VENTRICULAR WALL MOTION STUDY LEFT VENTRICULAR EJECTION FRACTION TECHNIQUE: Standard myocardial SPECT imaging was performed after resting intravenous injection of 10 mCi Tc-61m tetrofosmin. Subsequently, intravenous infusion of Lexiscan was performed under the supervision of the Cardiology staff. At peak effect of the drug, 30 mCi Tc-61m tetrofosmin was injected intravenously and standard myocardial SPECT imaging was performed. Quantitative gated imaging was also performed to evaluate left ventricular wall motion, and estimate left ventricular ejection fraction. COMPARISON:  None. 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 There appears to be a small area of mild reversibility in the  lateral wall. Wall Motion: Decreased wall motion in the septum. Left Ventricular Ejection Fraction: 47 % End diastolic volume 824 ml End systolic volume 72 ml IMPRESSION: 1. 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 Adjacent small area of reversibility in the lateral wall concerning for small area of inducible ischemia. 2. Decreased wall motion in the septum. 3. Left ventricular ejection fraction 47% 4. Non invasive risk stratification*: Intermediate *2012 Appropriate Use Criteria for Coronary Revascularization Focused Update: J Am Coll Cardiol. 2353;61(4):431-540. http://content.airportbarriers.com.aspx?articleid=[email or web address] Electronically Signed   By: Rolm Baptise M.D.   On: 08/27/2017 12:16   Disposition   Pt is being discharged home today in good condition.  Follow-up Plans & Appointments    Follow-up Information    [facility] Follow up on 09/08/2017.   Specialty:  Cardiology Why:  for follow up labs Contact information: [address] [place]       Daune Perch, NP Follow up on 09/14/2017.   Specialty:  Nurse Practitioner Why:  at 11am for your follow up appt.  Contact information: [address] [place] [address] [phone]           Discharge Medications     Medication List    TAKE these medications   aspirin 81 MG EC tablet Take 1  tablet (81 mg total) by mouth daily.   atorvastatin 40 MG tablet Commonly known as:  LIPITOR Take 1 tablet (40 mg total) by mouth daily at 6 PM.   clopidogrel 75 MG tablet Commonly known as:  PLAVIX Take 1 tablet (75 mg total) by mouth daily.   Fish Oil 1200 MG Caps Take 1,200 mg by mouth every evening.   metoprolol tartrate 25 MG tablet Commonly known as:  LOPRESSOR TAKE 1 TABLET BY MOUTH TWICE DAILY   nitroGLYCERIN 0.4 MG SL tablet Commonly known as:  NITROSTAT Place 0.4 mg under the tongue every 5  (five) minutes as needed for chest pain.   ranitidine 150 MG tablet Commonly known as:  ZANTAC Take 150 mg by mouth 2 (two) times daily.   triamterene-hydrochlorothiazide 37.5-25 MG tablet Commonly known as:  MAXZIDE-25 TAKE 1 TABLET BY MOUTH DAILY MONDAY THROUGH FRIDAY   vitamin C 500 MG tablet Commonly known as:  ASCORBIC ACID Take 500 mg by mouth every evening.       Outstanding Labs/Studies   CBC on 6/25  Duration of Discharge Encounter   Greater than 30 minutes including physician time.  Signed, Reino Bellis NP-C 09/02/2017, 9:23 AM   I have personally seen and examined this patient. I agree with the assessment and plan as outlined above.  Failed attempt at PCI of RCA yesterday per Dr. Saundra Shelling.  He is stable this am.  Will discharge home today.  He will need follow up with an office APP in 1-2 weeks and at that time discussion can be had with dr. Irish Lack regarding plans for possible repeat attempts at PCI of the RCA.  Continue ASA and Plavix.   Lauree Chandler 09/02/2017 9:36 AM

## 2017-09-02 NOTE — Telephone Encounter (Signed)
New Message   TOC appoinment made on 09/14/2017 at 11:00am with Daune Perch per Mendel Ryder

## 2017-09-02 NOTE — Telephone Encounter (Signed)
Patient contacted regarding discharge from [facility] on 09/02/2017.  Patient understands to follow up with provider Daune Perch on July 1 at 11:00am at [facility].. Patient understands discharge instructions? yes Patient understands medications and regiment? yes Patient understands to bring all medications to this visit? yes

## 2017-09-08 ENCOUNTER — Other Ambulatory Visit: Payer: Medicare Other

## 2017-09-08 DIAGNOSIS — Z79899 Other long term (current) drug therapy: Secondary | ICD-10-CM

## 2017-09-08 LAB — CBC
Hematocrit: 32 % — ABNORMAL LOW (ref 37.5–51.0)
Hemoglobin: 10.7 g/dL — ABNORMAL LOW (ref 13.0–17.7)
MCH: 32.1 pg (ref 26.6–33.0)
MCHC: 33.4 g/dL (ref 31.5–35.7)
MCV: 96 fL (ref 79–97)
PLATELETS: 104 10*3/uL — AB (ref 150–450)
RBC: 3.33 x10E6/uL — ABNORMAL LOW (ref 4.14–5.80)
RDW: 14.4 % (ref 12.3–15.4)
WBC: 3.9 10*3/uL (ref 3.4–10.8)

## 2017-09-08 LAB — DRAW FEE (FINGERSTICK)

## 2017-09-11 ENCOUNTER — Telehealth: Payer: Self-pay | Admitting: Cardiovascular Disease

## 2017-09-11 NOTE — Telephone Encounter (Signed)
Faxed the pts lab results with Melina Copa PA-C recommendations, to his PCP office, Dr Arelia Sneddon.  Pt aware that this was faxed this morning x 2, and to follow with their office on this matter. Pt verbalized understanding and agrees with this plan.

## 2017-09-11 NOTE — Telephone Encounter (Signed)
New Message    Patient is calling because Dr. Redmond Pulling is requesting a copy of the patients last labs to be sent. Dr. Redmond Pulling office number is [phone]. Please call.

## 2017-09-14 ENCOUNTER — Encounter: Payer: Self-pay | Admitting: Cardiology

## 2017-09-14 ENCOUNTER — Ambulatory Visit (INDEPENDENT_AMBULATORY_CARE_PROVIDER_SITE_OTHER): Payer: Medicare Other | Admitting: Cardiology

## 2017-09-14 VITALS — BP 138/70 | HR 59 | Ht 66.0 in | Wt 198.4 lb

## 2017-09-14 DIAGNOSIS — I25718 Atherosclerosis of autologous vein coronary artery bypass graft(s) with other forms of angina pectoris: Secondary | ICD-10-CM

## 2017-09-14 DIAGNOSIS — Z950 Presence of cardiac pacemaker: Secondary | ICD-10-CM

## 2017-09-14 DIAGNOSIS — I10 Essential (primary) hypertension: Secondary | ICD-10-CM | POA: Diagnosis not present

## 2017-09-14 DIAGNOSIS — Z953 Presence of xenogenic heart valve: Secondary | ICD-10-CM | POA: Diagnosis not present

## 2017-09-14 DIAGNOSIS — D696 Thrombocytopenia, unspecified: Secondary | ICD-10-CM | POA: Diagnosis not present

## 2017-09-14 DIAGNOSIS — E785 Hyperlipidemia, unspecified: Secondary | ICD-10-CM | POA: Diagnosis not present

## 2017-09-14 MED ORDER — ISOSORBIDE MONONITRATE ER 60 MG PO TB24: 60.0000 mg | ORAL_TABLET | Freq: Every day | ORAL | 3 refills | Status: DC

## 2017-09-14 NOTE — H&P (View-Only) (Signed)
Cardiology Office Note:    Date:  09/14/2017   ID:  [name], DOB 1934-04-24, MRN [id number]  PCP:  Curtis Downing, MD  Cardiologist:  Curtis Him, MD  Referring MD: [name], *   Chief Complaint  Patient presents with  . Hospitalization Follow-up    post cath  . Chest Pain    History of Present Illness:    [name] is a 83 y.o. male with a past medical history significant for CAD s/p PTCA and CABG ([id number]) nowwith chronic occlusion of SVG to RCA per cardiac cath 2017(see below),AVSs/p [id number], HTN, remote history of PAF not on anticoagulationsecondary to nosebleeds, COPD, sleep apnea on nocturnal BiPAP, remote prostate CAs/pprostatectomy and SSSs/p PPM.  The patient had complaints  of chest pain in June and underwent a stress test which was abnormal. He therefore underwent cardiac catheterization on 08/28/2017 that showed known occlusion of SVG-RCA, widely patent LIMA-LAD, SVG- 1st diag, Seq SVG-RI-OM3. He was loaded with plavix and planned for staged intervention with atherectomy.  He underwent cardiac cath on 09/01/17 with Dr. Irish Sims with attempt made at [facility] but unsuccessful 2/2 to poor guide support. Unable to get guide support from right radial cath site. Planned to continue medical management. Of note his platelet count was slightly below baseline (80-90) at 76,000 following cath.  Today he is here alone for follow up. He notes that he been having worsening chest pain described as burning - lower sternal area and epigastric area, non-radiating associated with nausea, dizziness and shortness of breath. This most often occurs when he first gets up in the morning and then not related to activity, can occur while sitting on the couch.Once he was awakened at 4 am.  He has to stop 2-3 times going to his mailbox. He takes 1-2 NTG with relief. The symptoms ease off but then it returns a few hours later. He is staing NTG 1-2 times per day. Addition of  Imdur didn't seem to help at all. No orthopnea, PND of edema. He is using his CPAP every night consistently.  He is compliant with medications including his Plavix. He is also taking Tylenol 2-3 times per day for constant aching of the shoulders.   Home BP has been reportedly well controlled-  120's-130's/65-70  No smoking since 1997.    Past Medical History:  Diagnosis Date  . AAA (abdominal aortic aneurysm) ([place])    a. Last duplex - 3.8cm 01/2013 - due 01/2014.  [name] Acne rosacea   . Alcoholic hepatitis with ascites 08/28/2017  . Anginal pain ([place]) 1996  . Aortic stenosis, mild    Last echo 05/04/12 +LVH  . Arthritis    "shoulders" (09/01/2017)  . Back pain    hx epidural injections  . Baker's cyst    Lt.  [name] CAD (coronary artery disease)    a. s/p MI/PTCA - Cx 1994. b. s/p CABG x5 in 1997. c. Abnl nuc 2003- occ SVG-RCA, patent seq SVG-OM1-dCxOM, patentl LIMA-LAD, patent, SVG-diag. d. Normal nuc 09/2012.  . Carotid artery disease ([place])    a. Duplex 01/2013: mildly abnormal. Mild plaque without significant diameter reduction. Repeat recommended 11/15.  [name] COPD (chronic obstructive pulmonary disease) ([place])   . Dizziness and giddiness 08/04/2013  . GERD (gastroesophageal reflux disease)   . H/O: GI bleed    mild, neg. colonoscopy  . Heart murmur   . HOH (hard of hearing)   . HTN (hypertension)   . Hyperlipidemia   . Hypertensive heart disease   .  Myocardial infarction ([place]) 1994  . OSA on CPAP   . OSA treated with BiPAP 11/20/2015   Severe with [facility] 38/hr  . PAF (paroxysmal atrial fibrillation) ([place])    a. Remote per records.   . Pneumonia    history  . Presence of permanent cardiac pacemaker    DOI 1999 with change out 2006; St Jude  . Prostate cancer ([place]) ~ 2010   a. s/p radical retropubic prostatectomy with bilateral pelvic lymph node dissection  . PVD (peripheral vascular disease) ([place])    a. Rt ext iliac stenosis, last PV cath 2003, moderate stenosis last Lower Ext Dopplers  12/16/11 - Rt. ABI 0.96  Lt ABI 1.0.  . Sick sinus syndrome ([place])    a. placed 1999, gen change 2011 - St. Jude.  . Sinus node dysfunction ([place]) 01/20/2013  . Syncope 11/28/97  . Thrombocytopenia ([facility])    chronic, mild  . Valvular heart disease    a. Echo 04/2012: mild-mod AS, mild AI, mild MR.    Past Surgical History:  Procedure Laterality Date  . APPENDECTOMY    . CARDIAC CATHETERIZATION  2003   VG to RCA occluded, other grafts patent  . CARDIAC CATHETERIZATION N/A 05/04/2015   Procedure: Right/Left Heart Cath and Coronary/Graft Angiography;  Surgeon: Curtis Mocha, MD;  Location: [place] [facility];  Service: Cardiovascular;  Laterality: N/A;  . CARDIAC CATHETERIZATION  09/01/2017  . CARDIAC VALVE REPLACEMENT    . CATARACT EXTRACTION W/ INTRAOCULAR LENS  IMPLANT, BILATERAL Bilateral   . COLONOSCOPY    . CORONARY ANGIOPLASTY  03/29/92   PTCA to LCX  . CORONARY ARTERY BYPASS GRAFT  1997   LIMA-LAD; VG-diag; seq VG-1st OM & distal LCX; VG-RCA  . CORONARY ATHERECTOMY N/A 09/01/2017   Procedure: CORONARY ATHERECTOMY;  Surgeon: Curtis Booze, MD;  Location: [place] [facility];  Service: Cardiovascular;  Laterality: N/A;  . INSERT / REPLACE / REMOVE PACEMAKER  11/28/1997   pacesetter--ERI 2011  . LEFT HEART CATH N/A 09/01/2017   Procedure: Left Heart Cath;  Surgeon: Curtis Booze, MD;  Location: [place] [facility];  Service: Cardiovascular;  Laterality: N/A;  . LEFT HEART CATH AND CORS/GRAFTS ANGIOGRAPHY N/A 08/28/2017   Procedure: LEFT HEART CATH AND CORS/GRAFTS ANGIOGRAPHY;  Surgeon: Curtis Man, MD;  Location: [place] [facility];  Service: Cardiovascular;  Laterality: N/A;  . PACEMAKER GENERATOR CHANGE  08/15/2009   St. Jude accent  . SUPRAPUBIC PROSTATECTOMY    . TEE WITHOUT CARDIOVERSION N/A 06/12/2015   Procedure: TRANSESOPHAGEAL ECHOCARDIOGRAM (TEE);  Surgeon: Curtis Mocha, MD;  Location: [place];  Service: Open Heart Surgery;  Laterality: N/A;  . TONSILLECTOMY      . TRANSCATHETER AORTIC VALVE REPLACEMENT, TRANSFEMORAL N/A 06/12/2015   Procedure: TRANSCATHETER AORTIC VALVE REPLACEMENT, TRANSFEMORAL, possible transpical;  Surgeon: Curtis Mocha, MD;  Location: [place];  Service: Open Heart Surgery;  Laterality: N/A;    Current Medications: Current Meds  Medication Sig  . aspirin EC 81 MG EC tablet Take 1 tablet (81 mg total) by mouth daily.  [name] atorvastatin (LIPITOR) 40 MG tablet Take 1 tablet (40 mg total) by mouth daily at 6 PM.  . clopidogrel (PLAVIX) 75 MG tablet Take 1 tablet (75 mg total) by mouth daily.  . isosorbide mononitrate (IMDUR) 30 MG 24 hr tablet Take 1 tablet by mouth daily.  . metoprolol tartrate (LOPRESSOR) 25 MG tablet TAKE 1 TABLET BY MOUTH TWICE DAILY  . nitroGLYCERIN (NITROSTAT) 0.4 MG SL tablet Place 0.4  mg under the tongue every 5 (five) minutes as needed for chest pain.  . Omega-3 Fatty Acids (FISH OIL) 1200 MG CAPS Take 1,200 mg by mouth every evening.  . ranitidine (ZANTAC) 150 MG tablet Take 150 mg by mouth 2 (two) times daily.   [name] triamterene-hydrochlorothiazide (MAXZIDE-25) 37.5-25 MG tablet TAKE 1 TABLET BY MOUTH DAILY MONDAY THROUGH FRIDAY  . vitamin C (ASCORBIC ACID) 500 MG tablet Take 500 mg by mouth every evening.     Allergies:   Patient has no known allergies.   Social History   Socioeconomic History  . Marital status: Widowed    Spouse name: Not on file  . Number of children: 5  . Years of education: 7th  . Highest education level: Not on file  Occupational History  . Occupation: Retired  Scientific laboratory technician  . Financial resource strain: Not on file  . Food insecurity:    Worry: Not on file    Inability: Not on file  . Transportation needs:    Medical: Not on file    Non-medical: Not on file  Tobacco Use  . Smoking status: Former Smoker    Packs/day: 2.00    Years: 48.00    Pack years: 96.00    Types: Cigarettes    Last attempt to quit: 03/18/1995    Years since quitting: 22.5  . Smokeless tobacco:  Never Used  Substance and Sexual Activity  . Alcohol use: No    Comment: 09/01/2017 "nothing in the last couple years"  . Drug use: Never  . Sexual activity: Not Currently  Lifestyle  . Physical activity:    Days per week: Not on file    Minutes per session: Not on file  . Stress: Not on file  Relationships  . Social connections:    Talks on phone: Not on file    Gets together: Not on file    Attends religious service: Not on file    Active member of club or organization: Not on file    Attends meetings of clubs or organizations: Not on file    Relationship status: Not on file  Other Topics Concern  . Not on file  Social History Narrative  . Not on file     Family History: [name] family history includes CAD in his unknown relative; Cancer in his brother; Colon cancer in his father; Heart Problems in his brother and sister. ROS:   Please see the history of present illness.     All other systems reviewed and are negative.  EKGs/Labs/Other Studies Reviewed:    The following studies were reviewed today:  Cath: 09/01/17 Conclusion    Ost RCA to Prox RCA lesion is 90% stenosed. Unsuccessful atempt at PCI/coronary atherectomy due to poor guide support.  SVG to RCA occluded.  Origin lesion is 100% stenosed.  Continue medical therapy.   If we retry PCI, would plan on long sheath from the groin due to tortuosity. Consider Akari 1.0 right guide catheter. Unable to get guide support from the right radial. Ned to see what microcatheter would navigate the distal tortuosity.        EKG:  EKG is not ordered today.   Recent Labs: 08/25/2017: ALT 29; B Natriuretic Peptide 121.7 09/02/2017: BUN 17; Creatinine, Ser 0.94; Potassium 4.1; Sodium 140 09/08/2017: Hemoglobin 10.7; Platelets 104   Recent Lipid Panel    Component Value Date/Time   CHOL 129 04/26/2015 1620   TRIG 207 (H) 04/26/2015 1620   HDL 39 (L)  04/26/2015 1620   CHOLHDL 3.3 04/26/2015 1620   VLDL  41 (H) 04/26/2015 1620   LDLCALC 49 04/26/2015 1620    Physical Exam:    VS:  BP 138/70   Pulse (!) 59   Ht 5\' 6"  (1.676 m)   Wt 198 lb 6.4 oz (90 kg)   SpO2 96%   BMI 32.02 kg/m     Wt Readings from Last 3 Encounters:  09/14/17 198 lb 6.4 oz (90 kg)  09/02/17 200 lb 13.4 oz (91.1 kg)  08/26/17 192 lb 7.4 oz (87.3 kg)     Physical Exam  Constitutional: He is oriented to person, place, and time. He appears well-developed and well-nourished. No distress.  HENT:  Head: Normocephalic and atraumatic.  Neck: Normal range of motion. Neck supple. No JVD present.  Cardiovascular: Normal rate, regular rhythm and intact distal pulses.  Murmur heard.  Harsh midsystolic murmur is present with a grade of 2/6 at the upper right sternal border radiating to the neck. Pulmonary/Chest: Effort normal and breath sounds normal. No respiratory distress. He has no wheezes. He has no rales.  Abdominal: Soft. Bowel sounds are normal.  Musculoskeletal: Normal range of motion. He exhibits no edema.  Neurological: He is alert and oriented to person, place, and time.  Skin: Skin is warm and dry.  Psychiatric: He has a normal mood and affect. His behavior is normal. Thought content normal.  Vitals reviewed.    ASSESSMENT:    1. Coronary artery disease of autologous vein bypass graft with stable angina pectoris ([place])   2. Essential hypertension   3. Dyslipidemia, goal LDL below 70   4. Thrombocytopenia ([place])   5. S/p TAVR (transcatheter aortic valve replacement), bioprosthetic   6. Cardiac pacemaker in situ    PLAN:    In order of problems listed above:  CAD with angina: s/p past CABG with known occlusion of SVG-RCA, widely patent LIMA-LAD, SVG- 1st diag, Seq SVG-RI-OM3. Attempted atherectomy of the RCA on 09/01/17 but unsuccessful due to poor guide support. Unable to get guide support from right radial cath site. He continues on aspirin and Plavix, beta blocker. Pt still having chest discomfort  relieved by NTG, shortness of breath and activity intolerance. Discussed case with Dr. Irish Sims. Will arrange for repeat cath for atherectomy of RCA with Dr. Burt Knack or Dr. Ellyn Hack. Will need to have groin access. Increase Imdur to 60 mg daily.  The patient understands that risks included but are not limited to stroke (1 in 1000), death (1 in 43), kidney failure [usually temporary] (1 in 500), bleeding (1 in 200), allergic reaction [possibly serious] (1 in 200).   Hypertension: metoprolol 25 mg, triamterene-hydrochlorothiazide 25 mg. BP well controlled.   Dyslipidemia: atorvastatin 40 mg, last LDL was 49 in 04/2015. Will check lipid panel.   Thrombocytopenia: platelet count was slightly below baseline of 80-90 at 76,000 following cath. Platelet count up to 104k on repeat labs 09/08/2017.  S/P TAVR 2017: bioprosthetic AV functioning normally by echo in 08/2017.   Dual-chamber pacemaker: followed by Dr. [name]   Medication Adjustments/Labs and Tests Ordered: Current medicines are reviewed at length with the patient today.  Concerns regarding medicines are outlined above. Labs and tests ordered and medication changes are outlined in the patient instructions below:  Patient Instructions  Medication Instructions: Your physician has recommended you make the following change in your medication:  INCREASE: Isosorbide (Imdur) to 60 mg daily   Labwork: TODAY: BMET CBC  Procedures/Testing:   [place]  [place] OFFICE [address], [address] [place] [address] Dept: [phone] Loc: [place]  09/14/2017  You are scheduled for a Cardiac Catheterization on Thursday, July 18 with Dr. [name].  1. Please arrive at the [facility] (Main Entrance A) at [facility]: [address] [place], [place] [address] at 5:30 AM (two hours before your procedure to ensure your preparation). Free valet  parking service is available.   Special note: Every effort is made to have your procedure done on time. Please understand that emergencies sometimes delay scheduled procedures.  2. Diet: Do not eat or drink anything after midnight prior to your procedure except sips of water to take medications.  3. Labs: You will need to have blood drawn on Monday, July 1 at [facility] at [facility]. [address]  Open: 7:30am - 5pm    Phone: [phone]. You do not need to be fasting.  4. Medication instructions in preparation for your procedure:  HOLD YOUR MAXZIDE THE MORNING OF YOUR PROCEDURE         On the morning of your procedure, take your Aspirin AND Plavix  and any morning medicines NOT listed above.  You may use sips of water.  5. Plan for one night stay--bring personal belongings. 6. Bring a current list of your medications and current insurance cards. 7. You MUST have a responsible person to drive you home. 8. Someone MUST be with you the first 24 hours after you arrive home or your discharge will be delayed. 9. Please wear clothes that are easy to get on and off and wear slip-on shoes.  Thank you for allowing [place] to care for you!   -- [place] [facility]   Follow-Up: Follow up in 10-14 days with Daune Perch or available APP after you have your cath  Any Additional Special Instructions Will Be Listed Below (If Applicable).     If you need a refill on your cardiac medications before your next appointment, please call your pharmacy.      Signed, Daune Perch, NP  09/14/2017 12:27 PM    [facility]  I have examined the patient and reviewed assessment and plan and discussed with patient.  Agree with above as stated.  Patient with persistent angina.  Increase Imdur today.  We again discussed repeat attempt at PCI with atherectomy.  I think this is reasonable.  We had difficulty with finding guide  support.  We will discuss with Dr. Ellyn Hack and Dr. Burt Knack regarding timing.  I am not in the Cath Lab for several weeks.  Would use femoral access.  Could use a shepherd's crook guide versus an Akari guide.  Would also use Rotablator 1.25 mm burr, rather than CSI since less wire purchase is required.  There is difficulty in getting the atherectomy wire through a bend in the distal vessel.  Larae Grooms

## 2017-09-14 NOTE — Patient Instructions (Addendum)
Medication Instructions: Your physician has recommended you make the following change in your medication:  INCREASE: Isosorbide (Imdur) to 60 mg daily   Labwork: TODAY: BMET CBC  Procedures/Testing:   [place] OFFICE [address] [place] [address] Dept: [phone] Loc: [place]  09/14/2017  You are scheduled for a Cardiac Catheterization on Thursday, July 18 with Dr. [name].  1. Please arrive at the [facility] (Main Entrance A) at [facility]: [address] [place], [place] [address] at 5:30 AM (two hours before your procedure to ensure your preparation). Free valet parking service is available.   Special note: Every effort is made to have your procedure done on time. Please understand that emergencies sometimes delay scheduled procedures.  2. Diet: Do not eat or drink anything after midnight prior to your procedure except sips of water to take medications.  3. Labs: You will need to have blood drawn on Monday, July 1 at [facility] at [facility]. [address]  Open: 7:30am - 5pm    Phone: [phone]. You do not need to be fasting.  4. Medication instructions in preparation for your procedure:  HOLD YOUR MAXZIDE THE MORNING OF YOUR PROCEDURE         On the morning of your procedure, take your Aspirin AND Plavix  and any morning medicines NOT listed above.  You may use sips of water.  5. Plan for one night stay--bring personal belongings. 6. Bring a current list of your medications and current insurance cards. 7. You MUST have a responsible person to drive you home. 8. Someone MUST be with you the first 24 hours after you arrive home or your discharge will be delayed. 9. Please wear clothes that are easy to get on and off and wear slip-on shoes.  Thank you for allowing [place] to care for you!   -- [place]  [facility]   Follow-Up: Follow up in 10-14 days with Daune Perch or available APP after you have your cath  Any Additional Special Instructions Will Be Listed Below (If Applicable).     If you need a refill on your cardiac medications before your next appointment, please call your pharmacy.

## 2017-09-14 NOTE — Progress Notes (Addendum)
Cardiology Office Note:    Date:  09/14/2017   ID:  [name], DOB 12-08-1934, MRN [id number]  PCP:  [name], MD  Cardiologist:  Fransico Him, MD  Referring MD: [name], *   Chief Complaint  Patient presents with  . Hospitalization Follow-up    post cath  . Chest Pain    History of Present Illness:    Drayce Tawil is a 83 y.o. male with a past medical history significant for CAD s/p PTCA and CABG ([id number]) nowwith chronic occlusion of SVG to RCA per cardiac cath 2017(see below),AVSs/p [id number], HTN, remote history of PAF not on anticoagulationsecondary to nosebleeds, COPD, sleep apnea on nocturnal BiPAP, remote prostate CAs/pprostatectomy and SSSs/p PPM.  The patient had complaints  of chest pain in June and underwent a stress test which was abnormal. He therefore underwent cardiac catheterization on 08/28/2017 that showed known occlusion of SVG-RCA, widely patent LIMA-LAD, SVG- 1st diag, Seq SVG-RI-OM3. He was loaded with plavix and planned for staged intervention with atherectomy.  He underwent cardiac cath on 09/01/17 with Dr. Irish Lack with attempt made at [facility] but unsuccessful 2/2 to poor guide support. Unable to get guide support from right radial cath site. Planned to continue medical management. Of note his platelet count was slightly below baseline (80-90) at 76,000 following cath.  Today he is here alone for follow up. He notes that he been having worsening chest pain described as burning - lower sternal area and epigastric area, non-radiating associated with nausea, dizziness and shortness of breath. This most often occurs when he first gets up in the morning and then not related to activity, can occur while sitting on the couch.Once he was awakened at 4 am.  He has to stop 2-3 times going to his mailbox. He takes 1-2 NTG with relief. The symptoms ease off but then it returns a few hours later. He is staing NTG 1-2 times per day. Addition of  Imdur didn't seem to help at all. No orthopnea, PND of edema. He is using his CPAP every night consistently.  He is compliant with medications including his Plavix. He is also taking Tylenol 2-3 times per day for constant aching of the shoulders.   Home BP has been reportedly well controlled-  120's-130's/65-70  No smoking since 1997.    Past Medical History:  Diagnosis Date  . AAA (abdominal aortic aneurysm) ([place])    a. Last duplex - 3.8cm 01/2013 - due 01/2014.  [name] Acne rosacea   . Alcoholic hepatitis with ascites 08/28/2017  . Anginal pain ([place]) 1996  . Aortic stenosis, mild    Last echo 05/04/12 +LVH  . Arthritis    "shoulders" (09/01/2017)  . Back pain    hx epidural injections  . Baker's cyst    Lt.  [name] CAD (coronary artery disease)    a. s/p MI/PTCA - Cx 1994. b. s/p CABG x5 in 1997. c. Abnl nuc 2003- occ SVG-RCA, patent seq SVG-OM1-dCxOM, patentl LIMA-LAD, patent, SVG-diag. d. Normal nuc 09/2012.  . Carotid artery disease ([place])    a. Duplex 01/2013: mildly abnormal. Mild plaque without significant diameter reduction. Repeat recommended 11/15.  [name] COPD (chronic obstructive pulmonary disease) ([place])   . Dizziness and giddiness 08/04/2013  . GERD (gastroesophageal reflux disease)   . H/O: GI bleed    mild, neg. colonoscopy  . Heart murmur   . HOH (hard of hearing)   . HTN (hypertension)   . Hyperlipidemia   . Hypertensive heart disease   .  Myocardial infarction ([place]) 1994  . OSA on CPAP   . OSA treated with BiPAP 11/20/2015   Severe with [facility] 38/hr  . PAF (paroxysmal atrial fibrillation) ([place])    a. Remote per records.   . Pneumonia    history  . Presence of permanent cardiac pacemaker    DOI 1999 with change out 2006; St Jude  . Prostate cancer ([place]) ~ 2010   a. s/p radical retropubic prostatectomy with bilateral pelvic lymph node dissection  . PVD (peripheral vascular disease) ([place])    a. Rt ext iliac stenosis, last PV cath 2003, moderate stenosis last Lower Ext Dopplers  12/16/11 - Rt. ABI 0.96  Lt ABI 1.0.  . Sick sinus syndrome ([place])    a. placed 1999, gen change 2011 - St. Jude.  . Sinus node dysfunction ([place]) 01/20/2013  . Syncope 11/28/97  . Thrombocytopenia ([facility])    chronic, mild  . Valvular heart disease    a. Echo 04/2012: mild-mod AS, mild AI, mild MR.    Past Surgical History:  Procedure Laterality Date  . APPENDECTOMY    . CARDIAC CATHETERIZATION  2003   VG to RCA occluded, other grafts patent  . CARDIAC CATHETERIZATION N/A 05/04/2015   Procedure: Right/Left Heart Cath and Coronary/Graft Angiography;  Surgeon: Sherren Mocha, MD;  Location: [facility];  Service: Cardiovascular;  Laterality: N/A;  . CARDIAC CATHETERIZATION  09/01/2017  . CARDIAC VALVE REPLACEMENT    . CATARACT EXTRACTION W/ INTRAOCULAR LENS  IMPLANT, BILATERAL Bilateral   . COLONOSCOPY    . CORONARY ANGIOPLASTY  03/29/92   PTCA to LCX  . CORONARY ARTERY BYPASS GRAFT  1997   LIMA-LAD; VG-diag; seq VG-1st OM & distal LCX; VG-RCA  . CORONARY ATHERECTOMY N/A 09/01/2017   Procedure: CORONARY ATHERECTOMY;  Surgeon: Jettie Booze, MD;  Location: [facility];  Service: Cardiovascular;  Laterality: N/A;  . INSERT / REPLACE / REMOVE PACEMAKER  11/28/1997   pacesetter--ERI 2011  . LEFT HEART CATH N/A 09/01/2017   Procedure: Left Heart Cath;  Surgeon: Jettie Booze, MD;  Location: [facility];  Service: Cardiovascular;  Laterality: N/A;  . LEFT HEART CATH AND CORS/GRAFTS ANGIOGRAPHY N/A 08/28/2017   Procedure: LEFT HEART CATH AND CORS/GRAFTS ANGIOGRAPHY;  Surgeon: Leonie Man, MD;  Location: [facility];  Service: Cardiovascular;  Laterality: N/A;  . PACEMAKER GENERATOR CHANGE  08/15/2009   St. Jude accent  . SUPRAPUBIC PROSTATECTOMY    . TEE WITHOUT CARDIOVERSION N/A 06/12/2015   Procedure: TRANSESOPHAGEAL ECHOCARDIOGRAM (TEE);  Surgeon: Sherren Mocha, MD;  Location: [facility];  Service: Open Heart Surgery;  Laterality: N/A;  . TONSILLECTOMY      . TRANSCATHETER AORTIC VALVE REPLACEMENT, TRANSFEMORAL N/A 06/12/2015   Procedure: TRANSCATHETER AORTIC VALVE REPLACEMENT, TRANSFEMORAL, possible transpical;  Surgeon: Sherren Mocha, MD;  Location: [facility];  Service: Open Heart Surgery;  Laterality: N/A;    Current Medications: Current Meds  Medication Sig  . aspirin EC 81 MG EC tablet Take 1 tablet (81 mg total) by mouth daily.  [name] atorvastatin (LIPITOR) 40 MG tablet Take 1 tablet (40 mg total) by mouth daily at 6 PM.  . clopidogrel (PLAVIX) 75 MG tablet Take 1 tablet (75 mg total) by mouth daily.  . isosorbide mononitrate (IMDUR) 30 MG 24 hr tablet Take 1 tablet by mouth daily.  . metoprolol tartrate (LOPRESSOR) 25 MG tablet TAKE 1 TABLET BY MOUTH TWICE DAILY  . nitroGLYCERIN (NITROSTAT) 0.4 MG SL tablet Place 0.4  mg under the tongue every 5 (five) minutes as needed for chest pain.  . Omega-3 Fatty Acids (FISH OIL) 1200 MG CAPS Take 1,200 mg by mouth every evening.  . ranitidine (ZANTAC) 150 MG tablet Take 150 mg by mouth 2 (two) times daily.   [name] triamterene-hydrochlorothiazide (MAXZIDE-25) 37.5-25 MG tablet TAKE 1 TABLET BY MOUTH DAILY MONDAY THROUGH FRIDAY  . vitamin C (ASCORBIC ACID) 500 MG tablet Take 500 mg by mouth every evening.     Allergies:   Patient has no known allergies.   Social History   Socioeconomic History  . Marital status: Widowed    Spouse name: Not on file  . Number of children: 5  . Years of education: 7th  . Highest education level: Not on file  Occupational History  . Occupation: Retired  Scientific laboratory technician  . Financial resource strain: Not on file  . Food insecurity:    Worry: Not on file    Inability: Not on file  . Transportation needs:    Medical: Not on file    Non-medical: Not on file  Tobacco Use  . Smoking status: Former Smoker    Packs/day: 2.00    Years: 48.00    Pack years: 96.00    Types: Cigarettes    Last attempt to quit: 03/18/1995    Years since quitting: 22.5  . Smokeless tobacco:  Never Used  Substance and Sexual Activity  . Alcohol use: No    Comment: 09/01/2017 "nothing in the last couple years"  . Drug use: Never  . Sexual activity: Not Currently  Lifestyle  . Physical activity:    Days per week: Not on file    Minutes per session: Not on file  . Stress: Not on file  Relationships  . Social connections:    Talks on phone: Not on file    Gets together: Not on file    Attends religious service: Not on file    Active member of club or organization: Not on file    Attends meetings of clubs or organizations: Not on file    Relationship status: Not on file  Other Topics Concern  . Not on file  Social History Narrative  . Not on file     Family History: [name] family history includes CAD in his unknown relative; Cancer in his brother; Colon cancer in his father; Heart Problems in his brother and sister. ROS:   Please see the history of present illness.     All other systems reviewed and are negative.  EKGs/Labs/Other Studies Reviewed:    The following studies were reviewed today:  Cath: 09/01/17 Conclusion    Ost RCA to Prox RCA lesion is 90% stenosed. Unsuccessful atempt at PCI/coronary atherectomy due to poor guide support.  SVG to RCA occluded.  Origin lesion is 100% stenosed.  Continue medical therapy.   If we retry PCI, would plan on long sheath from the groin due to tortuosity. Consider Akari 1.0 right guide catheter. Unable to get guide support from the right radial. Ned to see what microcatheter would navigate the distal tortuosity.        EKG:  EKG is not ordered today.   Recent Labs: 08/25/2017: ALT 29; B Natriuretic Peptide 121.7 09/02/2017: BUN 17; Creatinine, Ser 0.94; Potassium 4.1; Sodium 140 09/08/2017: Hemoglobin 10.7; Platelets 104   Recent Lipid Panel    Component Value Date/Time   CHOL 129 04/26/2015 1620   TRIG 207 (H) 04/26/2015 1620   HDL 39 (L)  04/26/2015 1620   CHOLHDL 3.3 04/26/2015 1620   VLDL  41 (H) 04/26/2015 1620   LDLCALC 49 04/26/2015 1620    Physical Exam:    VS:  BP 138/70   Pulse (!) 59   Ht 5\' 6"  (1.676 m)   Wt 198 lb 6.4 oz (90 kg)   SpO2 96%   BMI 32.02 kg/m     Wt Readings from Last 3 Encounters:  09/14/17 198 lb 6.4 oz (90 kg)  09/02/17 200 lb 13.4 oz (91.1 kg)  08/26/17 192 lb 7.4 oz (87.3 kg)     Physical Exam  Constitutional: He is oriented to person, place, and time. He appears well-developed and well-nourished. No distress.  HENT:  Head: Normocephalic and atraumatic.  Neck: Normal range of motion. Neck supple. No JVD present.  Cardiovascular: Normal rate, regular rhythm and intact distal pulses.  Murmur heard.  Harsh midsystolic murmur is present with a grade of 2/6 at the upper right sternal border radiating to the neck. Pulmonary/Chest: Effort normal and breath sounds normal. No respiratory distress. He has no wheezes. He has no rales.  Abdominal: Soft. Bowel sounds are normal.  Musculoskeletal: Normal range of motion. He exhibits no edema.  Neurological: He is alert and oriented to person, place, and time.  Skin: Skin is warm and dry.  Psychiatric: He has a normal mood and affect. His behavior is normal. Thought content normal.  Vitals reviewed.    ASSESSMENT:    1. Coronary artery disease of autologous vein bypass graft with stable angina pectoris ([place])   2. Essential hypertension   3. Dyslipidemia, goal LDL below 70   4. Thrombocytopenia ([place])   5. S/p TAVR (transcatheter aortic valve replacement), bioprosthetic   6. Cardiac pacemaker in situ    PLAN:    In order of problems listed above:  CAD with angina: s/p past CABG with known occlusion of SVG-RCA, widely patent LIMA-LAD, SVG- 1st diag, Seq SVG-RI-OM3. Attempted atherectomy of the RCA on 09/01/17 but unsuccessful due to poor guide support. Unable to get guide support from right radial cath site. He continues on aspirin and Plavix, beta blocker. Pt still having chest discomfort  relieved by NTG, shortness of breath and activity intolerance. Discussed case with Dr. Irish Lack. Will arrange for repeat cath for atherectomy of RCA with Dr. Burt Knack or Dr. Ellyn Hack. Will need to have groin access. Increase Imdur to 60 mg daily.  The patient understands that risks included but are not limited to stroke (1 in 1000), death (1 in 81), kidney failure [usually temporary] (1 in 500), bleeding (1 in 200), allergic reaction [possibly serious] (1 in 200).   Hypertension: metoprolol 25 mg, triamterene-hydrochlorothiazide 25 mg. BP well controlled.   Dyslipidemia: atorvastatin 40 mg, last LDL was 49 in 04/2015. Will check lipid panel.   Thrombocytopenia: platelet count was slightly below baseline of 80-90 at 76,000 following cath. Platelet count up to 104k on repeat labs 09/08/2017.  S/P TAVR 2017: bioprosthetic AV functioning normally by echo in 08/2017.   Dual-chamber pacemaker: followed by Dr. [name]   Medication Adjustments/Labs and Tests Ordered: Current medicines are reviewed at length with the patient today.  Concerns regarding medicines are outlined above. Labs and tests ordered and medication changes are outlined in the patient instructions below:  Patient Instructions  Medication Instructions: Your physician has recommended you make the following change in your medication:  INCREASE: Isosorbide (Imdur) to 60 mg daily   Labwork: TODAY: BMET CBC  Procedures/Testing:   [facility]  [place] OFFICE [address] [place] [address] Dept: [phone] Loc: [place]  09/14/2017  You are scheduled for a Cardiac Catheterization on Thursday, July 18 with Dr. [name].  1. Please arrive at the [facility] (Main Entrance A) at [facility]: [address] [place], [place] [address] at 5:30 AM (two hours before your procedure to ensure your preparation). Free valet  parking service is available.   Special note: Every effort is made to have your procedure done on time. Please understand that emergencies sometimes delay scheduled procedures.  2. Diet: Do not eat or drink anything after midnight prior to your procedure except sips of water to take medications.  3. Labs: You will need to have blood drawn on Monday, July 1 at [facility] at [facility] [address]  Open: 7:30am - 5pm    Phone: [phone]. You do not need to be fasting.  4. Medication instructions in preparation for your procedure:  HOLD YOUR MAXZIDE THE MORNING OF YOUR PROCEDURE         On the morning of your procedure, take your Aspirin AND Plavix  and any morning medicines NOT listed above.  You may use sips of water.  5. Plan for one night stay--bring personal belongings. 6. Bring a current list of your medications and current insurance cards. 7. You MUST have a responsible person to drive you home. 8. Someone MUST be with you the first 24 hours after you arrive home or your discharge will be delayed. 9. Please wear clothes that are easy to get on and off and wear slip-on shoes.  Thank you for allowing [place] to care for you!   -- [facility]   Follow-Up: Follow up in 10-14 days with Daune Perch or available APP after you have your cath  Any Additional Special Instructions Will Be Listed Below (If Applicable).     If you need a refill on your cardiac medications before your next appointment, please call your pharmacy.      Signed, Daune Perch, NP  09/14/2017 12:27 PM    [facility]  I have examined the patient and reviewed assessment and plan and discussed with patient.  Agree with above as stated.  Patient with persistent angina.  Increase Imdur today.  We again discussed repeat attempt at PCI with atherectomy.  I think this is reasonable.  We had difficulty with finding guide  support.  We will discuss with Dr. Ellyn Hack and Dr. Burt Knack regarding timing.  I am not in the Cath Lab for several weeks.  Would use femoral access.  Could use a shepherd's crook guide versus an Akari guide.  Would also use Rotablator 1.25 mm burr, rather than CSI since less wire purchase is required.  There is difficulty in getting the atherectomy wire through a bend in the distal vessel.  Larae Grooms

## 2017-09-15 ENCOUNTER — Other Ambulatory Visit: Payer: Self-pay | Admitting: Internal Medicine

## 2017-09-15 LAB — BASIC METABOLIC PANEL
BUN / CREAT RATIO: 14 (ref 10–24)
BUN: 13 mg/dL (ref 8–27)
CHLORIDE: 108 mmol/L — AB (ref 96–106)
CO2: 23 mmol/L (ref 20–29)
Calcium: 9.4 mg/dL (ref 8.6–10.2)
Creatinine, Ser: 0.93 mg/dL (ref 0.76–1.27)
GFR calc Af Amer: 89 mL/min/{1.73_m2} (ref >59)
GFR calc non Af Amer: 77 mL/min/{1.73_m2} (ref >59)
GLUCOSE: 78 mg/dL (ref 65–99)
Potassium: 3.9 mmol/L (ref 3.5–5.2)
Sodium: 139 mmol/L (ref 134–144)

## 2017-09-15 LAB — CBC
Hematocrit: 35.5 % — ABNORMAL LOW (ref 37.5–51.0)
Hemoglobin: 11.4 g/dL — ABNORMAL LOW (ref 13.0–17.7)
MCH: 31.6 pg (ref 26.6–33.0)
MCHC: 32.1 g/dL (ref 31.5–35.7)
MCV: 98 fL — ABNORMAL HIGH (ref 79–97)
PLATELETS: 114 10*3/uL — AB (ref 150–450)
RBC: 3.61 x10E6/uL — AB (ref 4.14–5.80)
RDW: 14.4 % (ref 12.3–15.4)
WBC: 3.7 10*3/uL (ref 3.4–10.8)

## 2017-09-17 NOTE — H&P (View-Only) (Signed)
Spoke to the patient regarding multiple caths and fluoro time.  He has not had any rash on his skin.  He will have a family member look at his back and chest as well, daily.  Continue plans for repeat cath on 7/18.

## 2017-09-17 NOTE — Progress Notes (Signed)
Spoke to the patient regarding multiple caths and fluoro time.  He has not had any rash on his skin.  He will have a family member look at his back and chest as well, daily.  Continue plans for repeat cath on 7/18.

## 2017-09-29 ENCOUNTER — Telehealth: Payer: Self-pay | Admitting: *Deleted

## 2017-09-29 NOTE — Telephone Encounter (Addendum)
Coronary atherectomy scheduled at [facility] for: Thursday October 01, 2017 7:30 AM Verified arrival time and place: [facility] Main Entrance A at:5:30 AM  No solid food after midnight prior to cath, clear liquids until 5 AM day of procedure. Verify allergies in Epic Verify no diabetes medications.  Hold: Triametere/HCTZ AM of procedure  Except hold medications AM meds can be  taken pre-cath with sip of water including: ASA 81 mg Clopidogrel 75 mg  Confirm patient has responsible person to drive home post procedure and for 24 hours after you arrive home: yes  Discussed instructions with patient, he verbalized understanding, thanked me for call.

## 2017-09-30 ENCOUNTER — Encounter (HOSPITAL_COMMUNITY): Payer: Self-pay | Admitting: *Deleted

## 2017-10-01 ENCOUNTER — Ambulatory Visit (HOSPITAL_COMMUNITY)
Admission: RE | Disposition: A | Discharge status: Home / Self Care | Payer: Self-pay | Source: Ambulatory Visit | Attending: Cardiology

## 2017-10-01 ENCOUNTER — Other Ambulatory Visit: Payer: Self-pay

## 2017-10-01 ENCOUNTER — Encounter (HOSPITAL_COMMUNITY): Payer: Self-pay | Admitting: General Practice

## 2017-10-01 ENCOUNTER — Ambulatory Visit (HOSPITAL_COMMUNITY)
Admission: RE | Admit: 2017-10-01 | Discharge: 2017-10-02 | Disposition: A | Discharge status: Home / Self Care | Payer: Medicare Other | Source: Ambulatory Visit | Attending: Cardiology | Admitting: Cardiology

## 2017-10-01 DIAGNOSIS — D696 Thrombocytopenia, unspecified: Secondary | ICD-10-CM | POA: Insufficient documentation

## 2017-10-01 DIAGNOSIS — I25119 Atherosclerotic heart disease of native coronary artery with unspecified angina pectoris: Secondary | ICD-10-CM

## 2017-10-01 DIAGNOSIS — Z87891 Personal history of nicotine dependence: Secondary | ICD-10-CM | POA: Insufficient documentation

## 2017-10-01 DIAGNOSIS — E785 Hyperlipidemia, unspecified: Secondary | ICD-10-CM | POA: Diagnosis not present

## 2017-10-01 DIAGNOSIS — I2584 Coronary atherosclerosis due to calcified coronary lesion: Secondary | ICD-10-CM | POA: Diagnosis not present

## 2017-10-01 DIAGNOSIS — Z9842 Cataract extraction status, left eye: Secondary | ICD-10-CM | POA: Insufficient documentation

## 2017-10-01 DIAGNOSIS — I119 Hypertensive heart disease without heart failure: Secondary | ICD-10-CM | POA: Diagnosis not present

## 2017-10-01 DIAGNOSIS — I480 Paroxysmal atrial fibrillation: Secondary | ICD-10-CM | POA: Insufficient documentation

## 2017-10-01 DIAGNOSIS — Z7982 Long term (current) use of aspirin: Secondary | ICD-10-CM | POA: Diagnosis not present

## 2017-10-01 DIAGNOSIS — I25810 Atherosclerosis of coronary artery bypass graft(s) without angina pectoris: Secondary | ICD-10-CM | POA: Diagnosis not present

## 2017-10-01 DIAGNOSIS — Z7901 Long term (current) use of anticoagulants: Secondary | ICD-10-CM | POA: Diagnosis not present

## 2017-10-01 DIAGNOSIS — Z9841 Cataract extraction status, right eye: Secondary | ICD-10-CM | POA: Insufficient documentation

## 2017-10-01 DIAGNOSIS — J449 Chronic obstructive pulmonary disease, unspecified: Secondary | ICD-10-CM | POA: Diagnosis not present

## 2017-10-01 DIAGNOSIS — I252 Old myocardial infarction: Secondary | ICD-10-CM | POA: Diagnosis not present

## 2017-10-01 DIAGNOSIS — L719 Rosacea, unspecified: Secondary | ICD-10-CM | POA: Insufficient documentation

## 2017-10-01 DIAGNOSIS — G4733 Obstructive sleep apnea (adult) (pediatric): Secondary | ICD-10-CM | POA: Insufficient documentation

## 2017-10-01 DIAGNOSIS — I495 Sick sinus syndrome: Secondary | ICD-10-CM | POA: Diagnosis not present

## 2017-10-01 DIAGNOSIS — Z7902 Long term (current) use of antithrombotics/antiplatelets: Secondary | ICD-10-CM | POA: Diagnosis not present

## 2017-10-01 DIAGNOSIS — I714 Abdominal aortic aneurysm, without rupture: Secondary | ICD-10-CM | POA: Diagnosis not present

## 2017-10-01 DIAGNOSIS — Z8546 Personal history of malignant neoplasm of prostate: Secondary | ICD-10-CM | POA: Insufficient documentation

## 2017-10-01 DIAGNOSIS — Z9889 Other specified postprocedural states: Secondary | ICD-10-CM | POA: Insufficient documentation

## 2017-10-01 DIAGNOSIS — Z955 Presence of coronary angioplasty implant and graft: Secondary | ICD-10-CM

## 2017-10-01 DIAGNOSIS — K219 Gastro-esophageal reflux disease without esophagitis: Secondary | ICD-10-CM | POA: Insufficient documentation

## 2017-10-01 DIAGNOSIS — Z8249 Family history of ischemic heart disease and other diseases of the circulatory system: Secondary | ICD-10-CM | POA: Diagnosis not present

## 2017-10-01 DIAGNOSIS — I2510 Atherosclerotic heart disease of native coronary artery without angina pectoris: Secondary | ICD-10-CM | POA: Diagnosis present

## 2017-10-01 DIAGNOSIS — I209 Angina pectoris, unspecified: Secondary | ICD-10-CM | POA: Diagnosis present

## 2017-10-01 DIAGNOSIS — Z953 Presence of xenogenic heart valve: Secondary | ICD-10-CM | POA: Diagnosis not present

## 2017-10-01 DIAGNOSIS — Z79899 Other long term (current) drug therapy: Secondary | ICD-10-CM | POA: Diagnosis not present

## 2017-10-01 HISTORY — PX: CORONARY ATHERECTOMY: CATH118238

## 2017-10-01 LAB — POCT ACTIVATED CLOTTING TIME
ACTIVATED CLOTTING TIME: 274 s
ACTIVATED CLOTTING TIME: 334 s
ACTIVATED CLOTTING TIME: 290 s
Activated Clotting Time: 268 seconds
Activated Clotting Time: 307 seconds
Activated Clotting Time: 318 seconds
Activated Clotting Time: 224 seconds
Activated Clotting Time: 180 seconds
Activated Clotting Time: 158 seconds

## 2017-10-01 SURGERY — CORONARY ATHERECTOMY
Anesthesia: LOCAL

## 2017-10-01 MED ORDER — IOPAMIDOL (ISOVUE-370) INJECTION 76%
INTRAVENOUS | Status: DC | PRN
  Administered 2017-10-01: 225 mL via INTRAVENOUS

## 2017-10-01 MED ORDER — VERAPAMIL HCL 2.5 MG/ML IV SOLN
INTRAVENOUS | Status: AC
  Filled 2017-10-01: qty 4

## 2017-10-01 MED ORDER — HEPARIN (PORCINE) IN NACL 1000-0.9 UT/500ML-% IV SOLN
INTRAVENOUS | Status: DC | PRN
  Administered 2017-10-01: 500 mL

## 2017-10-01 MED ORDER — NITROGLYCERIN 0.4 MG SL SUBL
SUBLINGUAL_TABLET | SUBLINGUAL | Status: DC | PRN
  Administered 2017-10-01: .4 mg via SUBLINGUAL

## 2017-10-01 MED ORDER — SODIUM CHLORIDE 0.9 % IV SOLN
INTRAVENOUS | Status: AC
  Administered 2017-10-01: 20:00:00 via INTRAVENOUS

## 2017-10-01 MED ORDER — SODIUM CHLORIDE 0.9 % WEIGHT BASED INFUSION
3.0000 mL/kg/h | INTRAVENOUS | Status: DC
  Administered 2017-10-01: 3 mL/kg/h via INTRAVENOUS

## 2017-10-01 MED ORDER — LIDOCAINE HCL (PF) 1 % IJ SOLN
INTRAMUSCULAR | Status: AC
  Filled 2017-10-01: qty 30

## 2017-10-01 MED ORDER — SODIUM CHLORIDE 0.9% FLUSH: 3.0000 mL | INTRAVENOUS | Status: DC | PRN

## 2017-10-01 MED ORDER — HEPARIN SODIUM (PORCINE) 1000 UNIT/ML IJ SOLN
INTRAMUSCULAR | Status: AC
  Filled 2017-10-01: qty 1

## 2017-10-01 MED ORDER — ACETAMINOPHEN 325 MG PO TABS: 650.0000 mg | ORAL_TABLET | ORAL | Status: DC | PRN

## 2017-10-01 MED ORDER — LABETALOL HCL 5 MG/ML IV SOLN: 10.0000 mg | INTRAVENOUS | Status: AC | PRN

## 2017-10-01 MED ORDER — HYDRALAZINE HCL 20 MG/ML IJ SOLN: 5.0000 mg | INTRAMUSCULAR | Status: AC | PRN

## 2017-10-01 MED ORDER — IOPAMIDOL (ISOVUE-370) INJECTION 76%
INTRAVENOUS | Status: AC
  Filled 2017-10-01: qty 125

## 2017-10-01 MED ORDER — MIDAZOLAM HCL 2 MG/2ML IJ SOLN
INTRAMUSCULAR | Status: AC
  Filled 2017-10-01: qty 2

## 2017-10-01 MED ORDER — NITROGLYCERIN IN D5W 200-5 MCG/ML-% IV SOLN
INTRAVENOUS | Status: AC
  Filled 2017-10-01: qty 250

## 2017-10-01 MED ORDER — SODIUM CHLORIDE 0.9% FLUSH: 3.0000 mL | Freq: Two times a day (BID) | INTRAVENOUS | Status: DC

## 2017-10-01 MED ORDER — ASPIRIN 81 MG PO CHEW: 81.0000 mg | CHEWABLE_TABLET | ORAL | Status: DC

## 2017-10-01 MED ORDER — CLOPIDOGREL BISULFATE 75 MG PO TABS
75.0000 mg | ORAL_TABLET | Freq: Every day | ORAL | Status: DC
  Administered 2017-10-02: 11:00:00 75 mg via ORAL
  Filled 2017-10-01: qty 1

## 2017-10-01 MED ORDER — SODIUM CHLORIDE 0.9 % WEIGHT BASED INFUSION: 1.0000 mL/kg/h | INTRAVENOUS | Status: DC

## 2017-10-01 MED ORDER — METOPROLOL TARTRATE 25 MG PO TABS
25.0000 mg | ORAL_TABLET | Freq: Two times a day (BID) | ORAL | Status: DC
  Administered 2017-10-01 – 2017-10-02 (×2): 25 mg via ORAL
  Filled 2017-10-01 (×2): qty 1

## 2017-10-01 MED ORDER — NITROGLYCERIN 0.4 MG SL SUBL
SUBLINGUAL_TABLET | SUBLINGUAL | Status: AC
  Filled 2017-10-01: qty 1

## 2017-10-01 MED ORDER — HEPARIN (PORCINE) IN NACL 1000-0.9 UT/500ML-% IV SOLN: INTRAVENOUS | Status: DC | PRN

## 2017-10-01 MED ORDER — NITROGLYCERIN 1 MG/10 ML FOR IR/CATH LAB
INTRA_ARTERIAL | Status: DC | PRN
  Administered 2017-10-01 (×3): 200 ug via INTRACORONARY

## 2017-10-01 MED ORDER — HEPARIN SODIUM (PORCINE) 1000 UNIT/ML IJ SOLN
INTRAMUSCULAR | Status: DC | PRN
  Administered 2017-10-01 (×2): 3000 [IU] via INTRAVENOUS
  Administered 2017-10-01: 7500 [IU] via INTRAVENOUS
  Administered 2017-10-01: 3000 [IU] via INTRAVENOUS

## 2017-10-01 MED ORDER — SODIUM CHLORIDE 0.9 % IV SOLN: 250.0000 mL | INTRAVENOUS | Status: DC | PRN

## 2017-10-01 MED ORDER — NITROGLYCERIN 0.4 MG SL SUBL: 0.4000 mg | SUBLINGUAL_TABLET | SUBLINGUAL | Status: DC | PRN

## 2017-10-01 MED ORDER — FAMOTIDINE 20 MG PO TABS
20.0000 mg | ORAL_TABLET | Freq: Two times a day (BID) | ORAL | Status: DC
  Administered 2017-10-01 – 2017-10-02 (×2): 20 mg via ORAL
  Filled 2017-10-01 (×3): qty 1

## 2017-10-01 MED ORDER — ATORVASTATIN CALCIUM 40 MG PO TABS
40.0000 mg | ORAL_TABLET | Freq: Every day | ORAL | Status: DC
  Administered 2017-10-01: 40 mg via ORAL
  Filled 2017-10-01: qty 1

## 2017-10-01 MED ORDER — ASPIRIN EC 81 MG PO TBEC
81.0000 mg | DELAYED_RELEASE_TABLET | Freq: Every day | ORAL | Status: DC
  Administered 2017-10-02: 81 mg via ORAL
  Filled 2017-10-01: qty 1

## 2017-10-01 MED ORDER — ISOSORBIDE MONONITRATE ER 60 MG PO TB24
60.0000 mg | ORAL_TABLET | Freq: Every day | ORAL | Status: DC
  Administered 2017-10-02: 60 mg via ORAL
  Filled 2017-10-01: qty 1

## 2017-10-01 MED ORDER — HEPARIN (PORCINE) IN NACL 1000-0.9 UT/500ML-% IV SOLN
INTRAVENOUS | Status: DC | PRN
  Administered 2017-10-01 (×2): 500 mL

## 2017-10-01 MED ORDER — MIDAZOLAM HCL 2 MG/2ML IJ SOLN
INTRAMUSCULAR | Status: DC | PRN
  Administered 2017-10-01: 0.5 mg via INTRAVENOUS
  Administered 2017-10-01 (×3): 1 mg via INTRAVENOUS

## 2017-10-01 MED ORDER — FENTANYL CITRATE (PF) 100 MCG/2ML IJ SOLN
INTRAMUSCULAR | Status: DC | PRN
  Administered 2017-10-01 (×3): 25 ug via INTRAVENOUS

## 2017-10-01 MED ORDER — ANGIOPLASTY BOOK
Freq: Once | Status: AC
  Administered 2017-10-01: 22:00:00
  Filled 2017-10-01: qty 1

## 2017-10-01 MED ORDER — HEPARIN (PORCINE) IN NACL 1000-0.9 UT/500ML-% IV SOLN
INTRAVENOUS | Status: AC
  Filled 2017-10-01: qty 1000

## 2017-10-01 MED ORDER — FENTANYL CITRATE (PF) 100 MCG/2ML IJ SOLN
INTRAMUSCULAR | Status: AC
  Filled 2017-10-01: qty 2

## 2017-10-01 MED ORDER — MORPHINE SULFATE (PF) 2 MG/ML IV SOLN
INTRAVENOUS | Status: DC | PRN
  Administered 2017-10-01: 2 mg via INTRAVENOUS

## 2017-10-01 MED ORDER — NITROGLYCERIN 1 MG/10 ML FOR IR/CATH LAB
INTRA_ARTERIAL | Status: AC
  Filled 2017-10-01: qty 10

## 2017-10-01 MED ORDER — HEPARIN (PORCINE) IN NACL 1000-0.9 UT/500ML-% IV SOLN
INTRAVENOUS | Status: AC
  Filled 2017-10-01: qty 500

## 2017-10-01 MED ORDER — ONDANSETRON HCL 4 MG/2ML IJ SOLN: 4.0000 mg | Freq: Four times a day (QID) | INTRAMUSCULAR | Status: DC | PRN

## 2017-10-01 MED ORDER — MORPHINE SULFATE (PF) 10 MG/ML IV SOLN
INTRAVENOUS | Status: AC
  Filled 2017-10-01: qty 1

## 2017-10-01 MED ORDER — LIDOCAINE HCL (PF) 1 % IJ SOLN
INTRAMUSCULAR | Status: DC | PRN
  Administered 2017-10-01 (×2): 2 mL

## 2017-10-01 SURGICAL SUPPLY — 32 items
BALLN EMERGE MR 3.0X15 (BALLOONS) ×2
BALLN ~~LOC~~ EMERGE MR 3.5X12 (BALLOONS) ×2
BALLOON EMERGE MR 3.0X15 (BALLOONS) IMPLANT
BALLOON ~~LOC~~ EMERGE MR 3.5X12 (BALLOONS) IMPLANT
BUR ROTAPRO CNCT ADVNCR 1.5 (BURR) IMPLANT
BURR ROTAPRO CNCT ADVNCR 1.5 (BURR) ×2
CABLE ADAPT CONN TEMP 6FT (ADAPTER) IMPLANT
CABLE SURGICAL S-101-97-12 (CABLE) IMPLANT
CATH HEARTRAIL 6F IR1.5 (CATHETERS) ×1 IMPLANT
CATH INFINITI JR4 5F (CATHETERS) ×1 IMPLANT
CATH S G BIP PACING (SET/KITS/TRAYS/PACK) IMPLANT
CATH TELEPORT (CATHETERS) ×1 IMPLANT
CATH VISTA GUIDE 6FR XBRCA (CATHETERS) ×1 IMPLANT
CATH VISTA GUIDE 7FR AR1 (CATHETERS) ×2 IMPLANT
CATH VISTA GUIDE 7FR JR4 (CATHETERS) ×1 IMPLANT
COVER PRB 48X5XTLSCP FOLD TPE (BAG) IMPLANT
COVER PROBE 5X48 (BAG) ×2
KIT ENCORE 26 ADVANTAGE (KITS) ×1 IMPLANT
KIT HEART LEFT (KITS) ×2 IMPLANT
LUBRICANT ROTAGLIDE 20CC VIAL (MISCELLANEOUS) ×1 IMPLANT
PACK CARDIAC CATHETERIZATION (CUSTOM PROCEDURE TRAY) ×2 IMPLANT
SHEATH PINNACLE 5F 10CM (SHEATH) ×1 IMPLANT
SHEATH PINNACLE 6F 10CM (SHEATH) ×1 IMPLANT
SHEATH PINNACLE ST 7F 45CM (SHEATH) ×1 IMPLANT
STENT SYNERGY DES 3X24 (Permanent Stent) ×1 IMPLANT
TRANSDUCER W/STOPCOCK (MISCELLANEOUS) ×2 IMPLANT
TUBING CIL FLEX 10 FLL-RA (TUBING) ×2 IMPLANT
WIRE EMERALD 3MM-J .035X150CM (WIRE) ×1 IMPLANT
WIRE HI TORQ VERSACORE-J 145CM (WIRE) ×1 IMPLANT
WIRE PT2 MS 185 (WIRE) ×1 IMPLANT
WIRE PT2 MS 300CM (WIRE) ×2 IMPLANT
WIRE ROTA FLOPPY .009X325CM (WIRE) ×2 IMPLANT

## 2017-10-01 NOTE — Progress Notes (Signed)
Site area: right groin  Site Prior to Removal:  Level 0  Pressure Applied For 20 MINUTES    Minutes Beginning at 1820  Manual:   Yes.    Patient Status During Pull:  Stable   Post Pull Groin Site:  Level 0  Post Pull Instructions Given:  Yes.    Post Pull Pulses Present:  Yes.    Dressing Applied:  Yes.    Comments:  Rechecked at [place] with no change in assessment  Bedrest started at [place] until 2240

## 2017-10-01 NOTE — Interval H&P Note (Signed)
History and Physical Interval Note:  10/01/2017 9:34 AM  [name]  has presented today for surgery, with the diagnosis of stable Class III angina  The various methods of treatment have been discussed with the patient and family.   After consideration of risks, benefits and other options for treatment, the patient has consented to  Procedure(s): CORONARY ATHERECTOMY (N/A) as a surgical intervention .  [name] history has been reviewed, patient examined, no change in status, stable for surgery.  I have reviewed the patient's chart and labs.  Questions were answered to the patient's satisfaction.     [name]

## 2017-10-01 NOTE — Interval H&P Note (Signed)
History and Physical Interval Note:  10/01/2017 9:39 AM  [name]  has presented today for surgery, with the diagnosis of Class III angina  The various methods of treatment have been discussed with the patient and family. After consideration of risks, benefits and other options for treatment, the patient has consented to  Procedure(s): CORONARY ATHERECTOMY (N/A) as a surgical intervention .  [name] history has been reviewed, patient examined, no change in status, stable for surgery.  I have reviewed the patient's chart and labs.  Questions were answered to the patient's satisfaction.     [name]

## 2017-10-02 DIAGNOSIS — I209 Angina pectoris, unspecified: Secondary | ICD-10-CM | POA: Diagnosis not present

## 2017-10-02 DIAGNOSIS — I10 Essential (primary) hypertension: Secondary | ICD-10-CM | POA: Diagnosis not present

## 2017-10-02 DIAGNOSIS — D696 Thrombocytopenia, unspecified: Secondary | ICD-10-CM | POA: Diagnosis not present

## 2017-10-02 DIAGNOSIS — I25810 Atherosclerosis of coronary artery bypass graft(s) without angina pectoris: Secondary | ICD-10-CM | POA: Diagnosis not present

## 2017-10-02 DIAGNOSIS — I2584 Coronary atherosclerosis due to calcified coronary lesion: Secondary | ICD-10-CM | POA: Diagnosis not present

## 2017-10-02 DIAGNOSIS — E782 Mixed hyperlipidemia: Secondary | ICD-10-CM | POA: Diagnosis not present

## 2017-10-02 DIAGNOSIS — I119 Hypertensive heart disease without heart failure: Secondary | ICD-10-CM | POA: Diagnosis not present

## 2017-10-02 LAB — BASIC METABOLIC PANEL
Anion gap: 7 (ref 5–15)
BUN: 17 mg/dL (ref 8–23)
CO2: 27 mmol/L (ref 22–32)
Calcium: 8.9 mg/dL (ref 8.9–10.3)
Chloride: 105 mmol/L (ref 98–111)
Creatinine, Ser: 0.94 mg/dL (ref 0.61–1.24)
GFR calc Af Amer: >60 mL/min (ref >60)
GLUCOSE: 100 mg/dL — AB (ref 70–99)
POTASSIUM: 4 mmol/L (ref 3.5–5.1)
Sodium: 139 mmol/L (ref 135–145)

## 2017-10-02 LAB — CBC
HEMATOCRIT: 33.4 % — AB (ref 39.0–52.0)
Hemoglobin: 10.8 g/dL — ABNORMAL LOW (ref 13.0–17.0)
MCH: 31.5 pg (ref 26.0–34.0)
MCHC: 32.3 g/dL (ref 30.0–36.0)
MCV: 97.4 fL (ref 78.0–100.0)
Platelets: 76 10*3/uL — ABNORMAL LOW (ref 150–400)
RBC: 3.43 MIL/uL — ABNORMAL LOW (ref 4.22–5.81)
RDW: 13.2 % (ref 11.5–15.5)
WBC: 4.6 10*3/uL (ref 4.0–10.5)

## 2017-10-02 MED ORDER — NITROGLYCERIN 0.4 MG SL SUBL: 0.4000 mg | SUBLINGUAL_TABLET | SUBLINGUAL | 2 refills | Status: DC | PRN

## 2017-10-02 MED FILL — Verapamil HCl IV Soln 2.5 MG/ML: INTRAVENOUS | Qty: 2 | Status: AC

## 2017-10-02 MED FILL — Nitroglycerin IV Soln 200 MCG/ML in D5W: INTRAVENOUS | Qty: 250 | Status: AC

## 2017-10-02 NOTE — Progress Notes (Addendum)
Progress Note  Patient Name: [name] Date of Encounter: 10/02/2017  Primary Cardiologist: Fransico Him, MD   Subjective   Doing ok post cath. He notes he had a little bit of CP last night, that felt more like indigestion, but it has resolved. Currently CP free. No dyspnea. Femoral access site stable. No groin pain.   Inpatient Medications    Scheduled Meds: . aspirin EC  81 mg Oral Daily  . atorvastatin  40 mg Oral q1800  . clopidogrel  75 mg Oral Daily  . famotidine  20 mg Oral BID  . isosorbide mononitrate  60 mg Oral Daily  . metoprolol tartrate  25 mg Oral BID  . sodium chloride flush  3 mL Intravenous Q12H   Continuous Infusions: . sodium chloride     PRN Meds: sodium chloride, acetaminophen, nitroGLYCERIN, ondansetron (ZOFRAN) IV, sodium chloride flush   Vital Signs    Vitals:   10/01/17 1930 10/02/17 0056 10/02/17 0316 10/02/17 0714  BP: 125/62  105/72 110/67  Pulse: 88  77 75  Resp: 18 (!) 26 20 18   Temp: 98.2 F (36.8 C)  98.3 F (36.8 C) 98.5 F (36.9 C)  TempSrc: Oral  Oral   SpO2: 96%  93% 95%  Weight:  191 lb 8 oz (86.9 kg)    Height:        Intake/Output Summary (Last 24 hours) at 10/02/2017 0753 Last data filed at 10/02/2017 0630 Gross per 24 hour  Intake 929.65 ml  Output 1500 ml  Net -570.35 ml   Filed Weights   10/01/17 0555 10/02/17 0056  Weight: 194 lb (88 kg) 191 lb 8 oz (86.9 kg)    Telemetry    NSR, 4 beat run of NSVT  - Personally Reviewed  ECG    SR, LBBB, 1st degree AV block - Personally Reviewed  Physical Exam   GEN: No acute distress.   Neck: No JVD Cardiac: RRR, no murmurs, rubs, or gallops.  Respiratory: Clear to auscultation bilaterally. GI: Soft, nontender, non-distended  MS: No edema; No deformity. Neuro:  Nonfocal  Psych: Normal affect   Labs    Chemistry Recent Labs  Lab 10/02/17 0338  NA 139  K 4.0  CL 105  CO2 27  GLUCOSE 100*  BUN 17  CREATININE 0.94  CALCIUM 8.9  GFRNONAA >60    GFRAA >60  ANIONGAP 7     Hematology Recent Labs  Lab 10/02/17 0338  WBC 4.6  RBC 3.43*  HGB 10.8*  HCT 33.4*  MCV 97.4  MCH 31.5  MCHC 32.3  RDW 13.2  PLT 76*    Cardiac EnzymesNo results for input(s): TROPONINI in the last 168 hours. No results for input(s): TROPIPOC in the last 168 hours.   BNPNo results for input(s): BNP, PROBNP in the last 168 hours.   DDimer No results for input(s): DDIMER in the last 168 hours.   Radiology    No results found.  Cardiac Studies   Cardiac Cath w/ Intervention Procedures   CORONARY ATHERECTOMY  Conclusion     Ost RCA to Prox RCA lesion is 95% stenosed. Post Rotational Atherectomy intervention, there is a 50% residual stenosis.  A drug-eluting stent was successfully placed using a STENT SYNERGY DES 3X24. post-dilated to 3.6 mm  Post intervention, there is a 0% residual stenosis.  Prox RCA lesion is 30% stenosed just beyond the stent.   Successful difficult PCI of ostial-proximal RCA with rotational atherectomy and DES stent placement.  Plan:  Overnight observation post PCI. Sheath removal in 6 Central post procedure unit. Continue antianginal medications and risk factor modification.  Recommend dual antiplatelet therapy with Aspirin 81mg  daily and Clopidogrel 75mg  daily long-term (beyond 12 months) because of Ostial stent and diffuse disease elsewhere.  Would be okay to stop aspirin after 3-6 months..       Patient Profile     [name] is a 83 y.o. male with a past medical history significant for CAD s/p PTCA and CABG ([id number],[id number]) nowwith chronic occlusion of SVG to RCA per cardiac cath 2017(see below),AVSs/p [id number], HTN, remote history of PAF not on anticoagulationsecondary to nosebleeds, COPD, sleep apnea on nocturnal BiPAP, remote prostate CAs/pprostatectomy and SSSs/p PPM.  The patient had complaints  of chest pain in June and underwent a stress test which was abnormal. He therefore underwent  cardiac catheterization on 08/28/2017 that showed known occlusion of SVG-RCA, widely patent LIMA-LAD, SVG- 1st diag, Seq SVG-RI-OM3. He was loaded with plavix and planned for staged intervention with atherectomy.He underwent cardiac cath on 09/01/17 with Dr. Irish Sims with attempt made at [facility] but unsuccessful 2/2 to poor guide support. Unable to get guide support from right radial cath site. Planned to continue medical management.  He was seen recently in clinic and complained of worsening CP and referred back for repeat attempt at PCI w/ atherectomy.   Assessment & Plan    1. CAD: history per above in pt profile. S/p successful, but difficult PCI of ostial-proximal RCA with rotational atherectomy and DES stent placement. Plan is for DAPT w/ ASA + Plavix long-term (beyond 12 months) because of Ostial stent and diffuse disease elsewhere.  Per Dr. Ellyn Hack, it would be okay to stop aspirin after 3-6 months. Currently CP free. VSS. Renal function stable. Right groin stable at cath access site. Will have pt ambulate w/ cardiac rehab this morning and if stable, will d/c home. Continue ASA, Plavix, metoprolol and atorvastatin. Pt notes he was not able to tolerate recent increase in Imdur to 60 mg due to intolerances (HAs). Now that he has undergone revascularization of the RCA, we can try to see how he does without nitrate. Reassess pt response and BP at outpatient f/u, which will be made in 1-2 weeks.   2. H/o AS: s/p TAVR in 2017.   3. PAF: NSR on telemetry. Rate controlled on metoprolol. No on oral anticoagulation due to frequent nosebleeds.   4. HTN: controlled on current regimen.   5. Lipids:  Controlled on statin therapy. Lipid panel 04/2017 showed LDL down to 49 mg/dL.   6. NSVT: 4 beat run noted on tele. Pt asymptomatic. Echo 08/2017 showed mildly reduced LVEF at 45-50%. K is WNL at 4.0. Continue BB therapy with metoprolol.   7. SSS: s/p PPM.   8. Chronic Thrombocytopenia: Platelet ct 76. He will  need to remain on DAPT for DES/CAD long term. We can monitor periodically in the office.  9. OSA: on CPAP.   Dispo: home today after cardiac rehab and clearance from MD.   For questions or updates, please contact [facility] Please consult [email or web address] for contact info under Cardiology/STEMI.      Signed, Lyda Jester, PA-C  10/02/2017, 7:53 AM    I have examined the patient and reviewed assessment and plan and discussed with patient.  Agree with above as stated.  S/p successful PCI of the RCA.  No skin redness noted on the back.  Right groin is stable.  No bruit or hematoma.  Palpable pedal  pulse on the right. OK to stop Imdur due to prior headaches.  Walk with rehab.    COntinue secondary prevention with lipid lowering therapy and BP lowering therapy.  Larae Grooms

## 2017-10-02 NOTE — Progress Notes (Signed)
CARDIAC REHAB PHASE I   PRE:  Rate/Rhythm: 88 SR  BP:  Supine:   Sitting: 151/66  Standing:    SaO2: 94%RA  MODE:  Ambulation: 500 ft   POST:  Rate/Rhythm: 107 ST  BP:  Supine:   Sitting: 154/81  Standing:    SaO2: 95%RA 0900-0937 Pt walked 500 ft on RA with steady gait and no CP. Tolerated well. Education completed with pt who voiced understanding. Stressed importance of plavix with stent. Reviewed NTG use, ex ed, gave heart healthy diet, and CRP 2. Referred to [facility] but pt does not want to attend. Stated he has treadmill at home he preferred to use.   Graylon Good, RN BSN  10/02/2017 9:34 AM

## 2017-10-02 NOTE — Discharge Summary (Addendum)
Discharge Summary    Patient ID: [name],  MRN: [id number], DOB/AGE: 1934-08-28 83 y.o.  Admit date: 10/01/2017 Discharge date: 10/02/2017  Primary Care Provider: [name] Primary Cardiologist: Fransico Him, MD  Discharge Diagnoses    Principal Problem:   Angina, class III [facility]) Active Problems:   CAD, multiple vessel   Allergies No Known Allergies  Diagnostic Studies/Procedures    Cardiac Cath w/ Intervention 10/02/17 Procedures   CORONARY ATHERECTOMY  Conclusion     Ost RCA to Prox RCA lesion is 95% stenosed. Post Rotational Atherectomy intervention, there is a 50% residual stenosis.  A drug-eluting stent was successfully placed using a STENT SYNERGY DES 3X24. post-dilated to 3.6 mm  Post intervention, there is a 0% residual stenosis.  Prox RCA lesion is 30% stenosed just beyond the stent.  Successful difficult PCI of ostial-proximal RCA with rotational atherectomy and DES stent placement.  Plan: Overnight observation post PCI. Sheath removal in 6 Central post procedure unit. Continue antianginal medications and risk factor modification.  Recommend dual antiplatelet therapy with Aspirin 81mg  daily and Clopidogrel 75mg  dailylong-term (beyond 12 months) because of Ostial stent and diffuse disease elsewhere. Would be okay to stop aspirin after 3-6 months..     History of Present Illness     [name] a 83 y.o.malewith a past medical history significant for CAD s/p PTCA and CABG (1994,1995)nowwith chronic occlusion of SVG to RCA per cardiac cath 2017(see below),AVSs/p [id number], HTN, remote history of PAF not on anticoagulationsecondary to nosebleeds, COPD, sleep apnea on nocturnal BiPAP, remote prostate CAs/pprostatectomy and SSSs/p PPM.  The patient had complaints of chest pain in June and underwent a stress test which was abnormal. He therefore underwent cardiac catheterization on 08/28/2017 that showed known  occlusion of SVG-RCA, widely patent LIMA-LAD, SVG- 1st diag, Seq SVG-RI-OM3. He was loaded with plavix and planned for staged intervention with atherectomy.He underwent cardiac cath on 09/01/17 with Dr. Irish Lack with attempt made at [facility] but unsuccessful 2/2 to poor guide support. Unable to get guide support from right radial cath site.Planned to continue medical management.  He was seen recently in clinic and complained of worsening CP. Imdur was increased from 30 to 60 mg and he was referred back for repeat attempt at PCI w/ atherectomy.  Hospital Course     Pt presented to [facility] on 10/01/17 for the planned procedure, performed by Dr. Ellyn Hack. Access obtained via the right femoral artery. He underwent successful, but difficult PCI of ostial-proximal RCA with rotational atherectomy and DES stent placement. He tolerated the procedure well and left the cath lab in stable condition. He was monitored overnight and had no post cath complications and no recurrent CP. His vital signs and renal function remained stable. His femoral cath access site stable. He ambulated with cardiac rehab w/o exertional symptoms or limitations. Pt was seen and examined by Dr. Beau Fanny, who determined he was stable for discharge home. Plan is for DAPT w/ ASA + Plavix long-term (beyond 12 months) because of Ostial stent and diffuse disease elsewhere. Per Dr. Ellyn Hack, it would be okay to stop aspirin after 3-6 months.  Pt also continued metoprolol and atorvastatin. Pt noted he was not able to tolerate recent increase in Imdur to 60 mg due to intolerances (HAs). Now that he has undergone revascularization of the RCA, we can try to see how he does without nitrate. This was discussed with Dr. Irish Lack who agreed. We will reassess pt response and BP at outpatient f/u, which  will be made in 1-2 weeks.   Consultants: none    Discharge Vitals Blood pressure 110/67, pulse 75, temperature 98.5 F (36.9 C), resp. rate 18, height 5\' 6"   (1.676 m), weight 191 lb 8 oz (86.9 kg), SpO2 95 %.  Filed Weights   10/01/17 0555 10/02/17 0056  Weight: 194 lb (88 kg) 191 lb 8 oz (86.9 kg)    Labs & Radiologic Studies    CBC Recent Labs    10/02/17 0338  WBC 4.6  HGB 10.8*  HCT 33.4*  MCV 97.4  PLT 76*   Basic Metabolic Panel Recent Labs    10/02/17 0338  NA 139  K 4.0  CL 105  CO2 27  GLUCOSE 100*  BUN 17  CREATININE 0.94  CALCIUM 8.9   Liver Function Tests No results for input(s): AST, ALT, ALKPHOS, BILITOT, PROT, ALBUMIN in the last 72 hours. No results for input(s): LIPASE, AMYLASE in the last 72 hours. Cardiac Enzymes No results for input(s): CKTOTAL, CKMB, CKMBINDEX, TROPONINI in the last 72 hours. BNP Invalid input(s): POCBNP D-Dimer No results for input(s): DDIMER in the last 72 hours. Hemoglobin A1C No results for input(s): HGBA1C in the last 72 hours. Fasting Lipid Panel No results for input(s): CHOL, HDL, LDLCALC, TRIG, CHOLHDL, LDLDIRECT in the last 72 hours. Thyroid Function Tests No results for input(s): TSH, T4TOTAL, T3FREE, THYROIDAB in the last 72 hours.  Invalid input(s): FREET3 _____________  No results found. Disposition   Pt is being discharged home today in good condition.  Follow-up Plans & Appointments    Follow-up Information    Daune Perch, NP Follow up on 10/22/2017.   Specialty:  Nurse Practitioner Why:  11:00 AM Contact information: [place] [id number] [phone]          Discharge Instructions    AMB Referral to Cardiac Rehabilitation - Phase II   Complete by:  As directed    Diagnosis:   Coronary Stents Stable Angina     Diet - low sodium heart healthy   Complete by:  As directed    Increase activity slowly   Complete by:  As directed       Discharge Medications   Allergies as of 10/02/2017   No Known Allergies     Medication List    STOP taking these medications   isosorbide mononitrate 60 MG 24 hr  tablet Commonly known as:  IMDUR     TAKE these medications   acetaminophen 325 MG tablet Commonly known as:  TYLENOL Take 650 mg by mouth 2 (two) times daily.   aspirin 81 MG EC tablet Take 1 tablet (81 mg total) by mouth daily.   atorvastatin 40 MG tablet Commonly known as:  LIPITOR Take 1 tablet (40 mg total) by mouth daily at 6 PM.   clopidogrel 75 MG tablet Commonly known as:  PLAVIX Take 1 tablet (75 mg total) by mouth daily.   Fish Oil 1200 MG Caps Take 1,200 mg by mouth every evening.   metoprolol tartrate 25 MG tablet Commonly known as:  LOPRESSOR TAKE 1 TABLET BY MOUTH TWICE DAILY   nitroGLYCERIN 0.4 MG SL tablet Commonly known as:  NITROSTAT Place 1 tablet (0.4 mg total) under the tongue every 5 (five) minutes as needed for chest pain. What changed:  See the new instructions.   ranitidine 150 MG tablet Commonly known as:  ZANTAC Take 150 mg by mouth 2 (two) times daily.   triamterene-hydrochlorothiazide 37.5-25  MG tablet Commonly known as:  MAXZIDE-25 TAKE 1 TABLET BY MOUTH DAILY MONDAY THROUGH FRIDAY What changed:    how much to take  how to take this  when to take this  additional instructions   vitamin C 500 MG tablet Commonly known as:  ASCORBIC ACID Take 500 mg by mouth every evening.        Acute coronary syndrome (MI, NSTEMI, STEMI, etc) this admission?: No.    Outstanding Labs/Studies   None   Duration of Discharge Encounter   Greater than 30 minutes including physician time.  Signed, Lyda Jester, PA-C 10/02/2017, 9:21 AM  I have examined the patient and reviewed assessment and plan and discussed with patient.  Agree with above as stated.  S/p successful PCI of the RCA.  No skin redness noted on the back.  Right groin is stable.  No bruit or hematoma.  Palpable pedal pulse on the right. OK to stop Imdur due to prior headaches.  Walk with rehab.    COntinue secondary prevention with lipid lowering therapy and BP lowering  therapy.  Larae Grooms

## 2017-10-05 ENCOUNTER — Telehealth (HOSPITAL_COMMUNITY): Payer: Self-pay

## 2017-10-05 NOTE — Telephone Encounter (Signed)
Referral recv'd, pt is not interesed in CR.  Closed referral

## 2017-10-06 ENCOUNTER — Telehealth: Payer: Self-pay | Admitting: Cardiology

## 2017-10-06 MED ORDER — PANTOPRAZOLE SODIUM 40 MG PO TBEC: 40.0000 mg | DELAYED_RELEASE_TABLET | Freq: Every day | ORAL | 11 refills | Status: DC

## 2017-10-06 NOTE — Telephone Encounter (Signed)
New message  Patient calling to report dark stool and  is having discomfort  around rib cage, pt recently had stent placement.

## 2017-10-06 NOTE — Telephone Encounter (Signed)
Pt is post successful PCI of RCA. Pt  had a Drug-eluted stent placement on 10/02/17, and F/U visit with NP on 10/22/17. Pt states that he has been having a burning sensation on the top of his chest like heartburn since before he had the procedure, and is having a lot of gas in his stomache. Pt also states that since Friday  he been having dark stools.  Pt stated that he did have stomach  ulcers before. Pt is taking Plavix 75 mg daily. Pt states other wise  he is feeling a lot better after the procedure. Pt is aware that this message will be send to Dr Radford Pax for recommendations.

## 2017-10-06 NOTE — Telephone Encounter (Signed)
Spoke with pt who is aware to stop zantac and start Protonix 40 mg daily.  Advised to call PCP ASAP for further evaluation of dark stools.  He states understanding.

## 2017-10-06 NOTE — Telephone Encounter (Signed)
Change Zantac to Protonix 40mg  daily and get in with PCP ASAP

## 2017-10-12 ENCOUNTER — Telehealth: Payer: Self-pay | Admitting: Internal Medicine

## 2017-10-12 NOTE — Telephone Encounter (Signed)
Offer pt an appt with Nicoletta Ba PA tomorrow at 10:30am.

## 2017-10-12 NOTE — Telephone Encounter (Signed)
Left message for patient to call back and schedule an appointment.

## 2017-10-13 ENCOUNTER — Other Ambulatory Visit (INDEPENDENT_AMBULATORY_CARE_PROVIDER_SITE_OTHER): Payer: Medicare Other

## 2017-10-13 ENCOUNTER — Ambulatory Visit (INDEPENDENT_AMBULATORY_CARE_PROVIDER_SITE_OTHER): Payer: Medicare Other | Admitting: Physician Assistant

## 2017-10-13 ENCOUNTER — Encounter: Payer: Self-pay | Admitting: Physician Assistant

## 2017-10-13 VITALS — BP 128/66 | HR 76 | Ht 66.0 in | Wt 190.0 lb

## 2017-10-13 DIAGNOSIS — K921 Melena: Secondary | ICD-10-CM

## 2017-10-13 DIAGNOSIS — D649 Anemia, unspecified: Secondary | ICD-10-CM

## 2017-10-13 DIAGNOSIS — Z7901 Long term (current) use of anticoagulants: Secondary | ICD-10-CM | POA: Diagnosis not present

## 2017-10-13 DIAGNOSIS — R1013 Epigastric pain: Secondary | ICD-10-CM | POA: Diagnosis not present

## 2017-10-13 LAB — CBC
HCT: 34.6 % — ABNORMAL LOW (ref 39.0–52.0)
HEMOGLOBIN: 11.8 g/dL — AB (ref 13.0–17.0)
MCHC: 34.2 g/dL (ref 30.0–36.0)
MCV: 96 fl (ref 78.0–100.0)
PLATELETS: 142 10*3/uL — AB (ref 150.0–400.0)
RBC: 3.61 Mil/uL — ABNORMAL LOW (ref 4.22–5.81)
RDW: 13.7 % (ref 11.5–15.5)
WBC: 4.4 10*3/uL (ref 4.0–10.5)

## 2017-10-13 NOTE — Patient Instructions (Signed)
Your provider has requested that you go to the basement level for lab work before leaving today. Press "B" on the elevator. The lab is located at the first door on the left as you exit the elevator.  We will contact you with further instructions   Increase Pantoprazole to 40 mg twice a day

## 2017-10-13 NOTE — Progress Notes (Signed)
Chief Complaint: Melena, Epigastric pain  HPI:    [name] is an 83 year old male with a past medical history as listed below including extensive cardiac history with recent drug-eluding stent placement on 10/02/2017 on aspirin and Plavix (echo 08/26/2017 with LVEF 45-50%), CAD status post PTCA and CABG (1994, 1995), aVF status post TAVR 2017, COPD, sleep apnea on nocturnal BiPAP and remote prostate cancer, who was referred to me by [name], * for a complaint of melena.      Per chart review it appears Dr. Michail Sermon did an EGD and colonoscopy on 02/08/2014 we do not have procedure report but pathology shows chronic inactive gastritis and fragments of hyperplastic polyp.    CBC 10/02/2017 shows a hemoglobin of 10.8.    10/06/2017 phone note with PCP where he described a burning sensation at the top of his chest like heartburn, gas and stomachache.  Also dark stools since 10/02/2017.  His Zantac was changed to Protonix 40 mg daily and he was told to see his PCP.  Recent hemosure positive per PCP.    Today, explains that he has had increasing "burning" epigastric pain over the past month and a half. This pain is rated as an 8-9/10. Unhelped by recent addition of Pantoprazole 40mg  qd.  Also has noticed black tarry stools once daily over the past 2 "ish" weeks. Accompanying symptoms include dizziness with standing and generalized fatigue.   Denies fever, chills, weight loss, nausea, vomiting or symptoms that awaken him at night.  Past Medical History:  Diagnosis Date  . AAA (abdominal aortic aneurysm) ([place])    a. Last duplex - 3.8cm 01/2013 - due 01/2014.  [name] Acne rosacea   . Alcoholic hepatitis with ascites 08/28/2017  . Anginal pain ([place]) 1996  . Aortic stenosis, mild    Last echo 05/04/12 +LVH  . Arthritis    "shoulders" (09/01/2017)  . Back pain    hx epidural injections  . Baker's cyst    Lt.  [name] CAD (coronary artery disease)    a. s/p MI/PTCA - Cx 1994. b. s/p CABG x5 in 1997. c.  Abnl nuc 2003- occ SVG-RCA, patent seq SVG-OM1-dCxOM, patentl LIMA-LAD, patent, SVG-diag. d. Normal nuc 09/2012.  . Carotid artery disease ([place])    a. Duplex 01/2013: mildly abnormal. Mild plaque without significant diameter reduction. Repeat recommended 11/15.  [name] COPD (chronic obstructive pulmonary disease) ([place])   . Dizziness and giddiness 08/04/2013  . GERD (gastroesophageal reflux disease)   . H/O: GI bleed    mild, neg. colonoscopy  . Heart murmur   . HOH (hard of hearing)   . HTN (hypertension)   . Hyperlipidemia   . Hypertensive heart disease   . Myocardial infarction ([place]) 1994  . OSA on CPAP   . OSA treated with BiPAP 11/20/2015   Severe with [facility] 38/hr  . PAF (paroxysmal atrial fibrillation) ([place])    a. Remote per records.   . Pneumonia    history  . Presence of permanent cardiac pacemaker    DOI 1999 with change out 2006; St Jude  . Prostate cancer ([place]) ~ 2010   a. s/p radical retropubic prostatectomy with bilateral pelvic lymph node dissection  . PVD (peripheral vascular disease) ([place])    a. Rt ext iliac stenosis, last PV cath 2003, moderate stenosis last Lower Ext Dopplers 12/16/11 - Rt. ABI 0.96  Lt ABI 1.0.  . Sick sinus syndrome ([place])    a. placed 1999, gen change 2011 - St. Jude.  [name]  Sinus node dysfunction ([place]) 01/20/2013  . Syncope 11/28/97  . Thrombocytopenia ([facility])    chronic, mild  . Valvular heart disease    a. Echo 04/2012: mild-mod AS, mild AI, mild MR.    Past Surgical History:  Procedure Laterality Date  . APPENDECTOMY    . CARDIAC CATHETERIZATION  2003   VG to RCA occluded, other grafts patent  . CARDIAC CATHETERIZATION N/A 05/04/2015   Procedure: Right/Left Heart Cath and Coronary/Graft Angiography;  Surgeon: Sherren Mocha, MD;  Location: [place] [facility] [facility];  Service: Cardiovascular;  Laterality: N/A;  . CARDIAC CATHETERIZATION  09/01/2017  . CARDIAC VALVE REPLACEMENT    . CATARACT EXTRACTION W/ INTRAOCULAR LENS  IMPLANT, BILATERAL Bilateral   .  COLONOSCOPY    . CORONARY ANGIOPLASTY  03/29/92   PTCA to LCX  . CORONARY ARTERY BYPASS GRAFT  1997   LIMA-LAD; VG-diag; seq VG-1st OM & distal LCX; VG-RCA  . CORONARY ATHERECTOMY N/A 09/01/2017   Procedure: CORONARY ATHERECTOMY;  Surgeon: Jettie Booze, MD;  Location: [place] [facility];  Service: Cardiovascular;  Laterality: N/A;  . CORONARY ATHERECTOMY  10/01/2017  . CORONARY ATHERECTOMY N/A 10/01/2017   Procedure: CORONARY ATHERECTOMY;  Surgeon: Leonie Man, MD;  Location: [place] [facility];  Service: Cardiovascular;  Laterality: N/A;  . INSERT / REPLACE / REMOVE PACEMAKER  11/28/1997   pacesetter--ERI 2011  . LEFT HEART CATH N/A 09/01/2017   Procedure: Left Heart Cath;  Surgeon: Jettie Booze, MD;  Location: [place];  Service: Cardiovascular;  Laterality: N/A;  . LEFT HEART CATH AND CORS/GRAFTS ANGIOGRAPHY N/A 08/28/2017   Procedure: LEFT HEART CATH AND CORS/GRAFTS ANGIOGRAPHY;  Surgeon: Leonie Man, MD;  Location: [facility];  Service: Cardiovascular;  Laterality: N/A;  . PACEMAKER GENERATOR CHANGE  08/15/2009   St. Jude accent  . SUPRAPUBIC PROSTATECTOMY    . TEE WITHOUT CARDIOVERSION N/A 06/12/2015   Procedure: TRANSESOPHAGEAL ECHOCARDIOGRAM (TEE);  Surgeon: Sherren Mocha, MD;  Location: [facility];  Service: Open Heart Surgery;  Laterality: N/A;  . TONSILLECTOMY    . TRANSCATHETER AORTIC VALVE REPLACEMENT, TRANSFEMORAL N/A 06/12/2015   Procedure: TRANSCATHETER AORTIC VALVE REPLACEMENT, TRANSFEMORAL, possible transpical;  Surgeon: Sherren Mocha, MD;  Location: [facility];  Service: Open Heart Surgery;  Laterality: N/A;    Current Outpatient Medications  Medication Sig Dispense Refill  . acetaminophen (TYLENOL) 325 MG tablet Take 650 mg by mouth 2 (two) times daily.    [name] aspirin EC 81 MG EC tablet Take 1 tablet (81 mg total) by mouth daily. 30 tablet 0  . atorvastatin (LIPITOR) 40 MG tablet Take 1 tablet (40 mg total) by mouth daily at 6 PM. 30 tablet 0   . clopidogrel (PLAVIX) 75 MG tablet Take 1 tablet (75 mg total) by mouth daily. 30 tablet 0  . metoprolol tartrate (LOPRESSOR) 25 MG tablet TAKE 1 TABLET BY MOUTH TWICE DAILY 180 tablet 3  . nitroGLYCERIN (NITROSTAT) 0.4 MG SL tablet Place 1 tablet (0.4 mg total) under the tongue every 5 (five) minutes as needed for chest pain. 25 tablet 2  . Omega-3 Fatty Acids (FISH OIL) 1200 MG CAPS Take 1,200 mg by mouth every evening.    . pantoprazole (PROTONIX) 40 MG tablet Take 1 tablet (40 mg total) by mouth daily. 30 tablet 11  . triamterene-hydrochlorothiazide (MAXZIDE-25) 37.5-25 MG tablet TAKE 1 TABLET BY MOUTH DAILY MONDAY THROUGH FRIDAY (Patient taking differently: Take 1 tablet by mouth every Monday, Tuesday, Wednesday, Thursday, and Friday. )  90 tablet 2  . vitamin C (ASCORBIC ACID) 500 MG tablet Take 500 mg by mouth every evening.     No current facility-administered medications for this visit.     Allergies as of 10/13/2017  . (No Known Allergies)    Family History  Problem Relation Age of Onset  . Colon cancer Father   . Cancer Brother   . Heart Problems Brother   . Heart Problems Sister   . CAD Unknown     Social History   Socioeconomic History  . Marital status: Widowed    Spouse name: Not on file  . Number of children: 5  . Years of education: 7th  . Highest education level: Not on file  Occupational History  . Occupation: Retired  Scientific laboratory technician  . Financial resource strain: Not on file  . Food insecurity:    Worry: Not on file    Inability: Not on file  . Transportation needs:    Medical: Not on file    Non-medical: Not on file  Tobacco Use  . Smoking status: Former Smoker    Packs/day: 2.00    Years: 48.00    Pack years: 96.00    Types: Cigarettes    Last attempt to quit: 03/18/1995    Years since quitting: 22.5  . Smokeless tobacco: Never Used  Substance and Sexual Activity  . Alcohol use: No    Comment: 09/01/2017 "nothing in the last couple years"  .  Drug use: Never  . Sexual activity: Not Currently  Lifestyle  . Physical activity:    Days per week: Not on file    Minutes per session: Not on file  . Stress: Not on file  Relationships  . Social connections:    Talks on phone: Not on file    Gets together: Not on file    Attends religious service: Not on file    Active member of club or organization: Not on file    Attends meetings of clubs or organizations: Not on file    Relationship status: Not on file  . Intimate partner violence:    Fear of current or ex partner: Not on file    Emotionally abused: Not on file    Physically abused: Not on file    Forced sexual activity: Not on file  Other Topics Concern  . Not on file  Social History Narrative  . Not on file    Review of Systems:    Constitutional: No weight loss, fever or chills Skin: No rash Cardiovascular: No chest pain Respiratory: No SOB Gastrointestinal: See HPI and otherwise negative Genitourinary: No dysuria  Neurological: +dizziness Musculoskeletal: No new muscle or joint pain Hematologic: No bruising Psychiatric: No history of depression or anxiety   Physical Exam:  Vital signs: BP 128/66   Pulse 76   Ht 5\' 6"  (1.676 m)   Wt 190 lb (86.2 kg)   BMI 30.67 kg/m    Constitutional:   Pleasant Caucasian male appears to be in NAD, Well developed, Well nourished, alert and cooperative Head:  Normocephalic and atraumatic. Eyes:   PEERL, EOMI. No icterus. Conjunctiva pink. Ears:  Normal auditory acuity. Neck:  Supple Throat: Oral cavity and pharynx without inflammation, swelling or lesion.  Respiratory: Respirations even and unlabored. Lungs clear to auscultation bilaterally.   No wheezes, crackles, or rhonchi.  Cardiovascular: Normal S1, S2. No MRG. Regular rate and rhythm. No peripheral edema, cyanosis or pallor.  Gastrointestinal:  Soft, nondistended, marked TTP in the  epigastrum with involuntary guarding, Normal bowel sounds. No appreciable masses or  hepatomegaly. Rectal: External: Pale thin looking perianal skin; internal: No mass or lesion; Hemoccult positive Msk:  Symmetrical without gross deformities. Without edema, no deformity or joint abnormality.  Neurologic:  Alert and  oriented x4;  grossly normal neurologically.  Skin:   Dry and intact without significant lesions or rashes. Psychiatric:  Demonstrates good judgement and reason without abnormal affect or behaviors.  RELEVANT LABS AND IMAGING: CBC    Component Value Date/Time   WBC 4.6 10/02/2017 0338   RBC 3.43 (L) 10/02/2017 0338   HGB 10.8 (L) 10/02/2017 0338   HGB 11.4 (L) 09/14/2017 1201   HCT 33.4 (L) 10/02/2017 0338   HCT 35.5 (L) 09/14/2017 1201   PLT 76 (L) 10/02/2017 0338   PLT 114 (L) 09/14/2017 1201   MCV 97.4 10/02/2017 0338   MCV 98 (H) 09/14/2017 1201   MCH 31.5 10/02/2017 0338   MCHC 32.3 10/02/2017 0338   RDW 13.2 10/02/2017 0338   RDW 14.4 09/14/2017 1201   LYMPHSABS 1.1 08/25/2017 2038   MONOABS 0.5 08/25/2017 2038   EOSABS 0.1 08/25/2017 2038   BASOSABS 0.0 08/25/2017 2038    CMP     Component Value Date/Time   NA 139 10/02/2017 0338   NA 139 09/14/2017 1201   K 4.0 10/02/2017 0338   CL 105 10/02/2017 0338   CO2 27 10/02/2017 0338   GLUCOSE 100 (H) 10/02/2017 0338   BUN 17 10/02/2017 0338   BUN 13 09/14/2017 1201   CREATININE 0.94 10/02/2017 0338   CREATININE 1.03 05/16/2015 1244   CALCIUM 8.9 10/02/2017 0338   PROT 7.3 08/25/2017 2038   ALBUMIN 3.8 08/25/2017 2038   AST 44 (H) 08/25/2017 2038   ALT 29 08/25/2017 2038   ALKPHOS 69 08/25/2017 2038   BILITOT 1.1 08/25/2017 2038   GFRNONAA >60 10/02/2017 0338   GFRNONAA 69 05/16/2015 1244   GFRAA >60 10/02/2017 0338   GFRAA 79 05/16/2015 1244    Assessment: 1. Melena: over past 2 weeks with decreasing hgb and epigastric pain; concern for PUD 2. Epigastric pain: with above 3. Recent DES placement 10/02/17: on Plavix and ASA 4. Chronic anticoagulation: Plavix after recent  stent  Plan: 1.  Patient will need to be scheduled for EGD.  Recheck of hemoglobin in the office shows one-point increase over the past couple of weeks.  This is reassuring.  Did go ahead and discuss risk, benefits, limitations and alternatives of proceeding with EGD.  Timing will be per Dr. Loletha Carrow.  This will likely need to take place in the hospital in case patient needs hemostasis. 2. Increased Pantoprazole to 40mg  BID. #60 with 3 refills 3. Patient will await further recommendations after discussion with Dr. Loletha Carrow today.  Explained that his hemoglobin will determine when his EGD needs to take place.  He is currently on Plavix after recent stent.  Ellouise Newer, PA-C [facility] 10/13/2017, 1:31 PM  Cc: [name], *

## 2017-10-14 ENCOUNTER — Other Ambulatory Visit: Payer: Self-pay

## 2017-10-14 DIAGNOSIS — K921 Melena: Secondary | ICD-10-CM

## 2017-10-14 NOTE — Progress Notes (Signed)
Thank you for sending this case to me, and for discussing it with me during the office visit. I have reviewed the entire note, and the outlined plan is what we discussed.  As we discussed, EGD must be done at hospital endoscopy department by next week.  If my Aug 6th [facility] block cannot be extended to accommodate this case, then it must be done by next week's [facility] hospital MD.  Wilfrid Lund, MD

## 2017-10-19 ENCOUNTER — Encounter (HOSPITAL_COMMUNITY): Payer: Self-pay

## 2017-10-19 ENCOUNTER — Other Ambulatory Visit: Payer: Self-pay

## 2017-10-20 ENCOUNTER — Ambulatory Visit (HOSPITAL_COMMUNITY): Payer: Medicare Other | Admitting: Anesthesiology

## 2017-10-20 ENCOUNTER — Encounter (HOSPITAL_COMMUNITY)
Admission: RE | Disposition: A | Discharge status: Home / Self Care | Payer: Self-pay | Source: Ambulatory Visit | Attending: Gastroenterology

## 2017-10-20 ENCOUNTER — Ambulatory Visit (HOSPITAL_COMMUNITY)
Admission: RE | Admit: 2017-10-20 | Discharge: 2017-10-20 | Disposition: A | Discharge status: Home / Self Care | Payer: Medicare Other | Source: Ambulatory Visit | Attending: Gastroenterology | Admitting: Gastroenterology

## 2017-10-20 ENCOUNTER — Other Ambulatory Visit: Payer: Self-pay

## 2017-10-20 ENCOUNTER — Encounter (HOSPITAL_COMMUNITY): Payer: Self-pay | Admitting: *Deleted

## 2017-10-20 DIAGNOSIS — R195 Other fecal abnormalities: Secondary | ICD-10-CM

## 2017-10-20 DIAGNOSIS — I2510 Atherosclerotic heart disease of native coronary artery without angina pectoris: Secondary | ICD-10-CM | POA: Diagnosis not present

## 2017-10-20 DIAGNOSIS — Z87891 Personal history of nicotine dependence: Secondary | ICD-10-CM | POA: Insufficient documentation

## 2017-10-20 DIAGNOSIS — C154 Malignant neoplasm of middle third of esophagus: Secondary | ICD-10-CM | POA: Insufficient documentation

## 2017-10-20 DIAGNOSIS — Z79899 Other long term (current) drug therapy: Secondary | ICD-10-CM | POA: Insufficient documentation

## 2017-10-20 DIAGNOSIS — C159 Malignant neoplasm of esophagus, unspecified: Secondary | ICD-10-CM | POA: Diagnosis not present

## 2017-10-20 DIAGNOSIS — K921 Melena: Secondary | ICD-10-CM | POA: Diagnosis not present

## 2017-10-20 DIAGNOSIS — Z951 Presence of aortocoronary bypass graft: Secondary | ICD-10-CM | POA: Insufficient documentation

## 2017-10-20 DIAGNOSIS — I252 Old myocardial infarction: Secondary | ICD-10-CM | POA: Insufficient documentation

## 2017-10-20 DIAGNOSIS — K2289 Other specified disease of esophagus: Secondary | ICD-10-CM

## 2017-10-20 DIAGNOSIS — D62 Acute posthemorrhagic anemia: Secondary | ICD-10-CM | POA: Diagnosis not present

## 2017-10-20 DIAGNOSIS — I10 Essential (primary) hypertension: Secondary | ICD-10-CM | POA: Diagnosis not present

## 2017-10-20 DIAGNOSIS — K228 Other specified diseases of esophagus: Secondary | ICD-10-CM

## 2017-10-20 HISTORY — PX: ESOPHAGOGASTRODUODENOSCOPY (EGD) WITH PROPOFOL: SHX5813

## 2017-10-20 HISTORY — PX: BIOPSY: SHX5522

## 2017-10-20 SURGERY — ESOPHAGOGASTRODUODENOSCOPY (EGD) WITH PROPOFOL
Anesthesia: Monitor Anesthesia Care

## 2017-10-20 MED ORDER — PROPOFOL 500 MG/50ML IV EMUL
INTRAVENOUS | Status: DC | PRN
  Administered 2017-10-20: 125 ug/kg/min via INTRAVENOUS

## 2017-10-20 MED ORDER — LACTATED RINGERS IV SOLN
INTRAVENOUS | Status: DC
  Administered 2017-10-20: 11:00:00 via INTRAVENOUS

## 2017-10-20 MED ORDER — PROPOFOL 10 MG/ML IV BOLUS
INTRAVENOUS | Status: AC
  Filled 2017-10-20: qty 60

## 2017-10-20 MED ORDER — SODIUM CHLORIDE 0.9 % IV SOLN: INTRAVENOUS | Status: DC

## 2017-10-20 MED ORDER — PROPOFOL 10 MG/ML IV BOLUS
INTRAVENOUS | Status: DC | PRN
  Administered 2017-10-20 (×2): 20 mg via INTRAVENOUS

## 2017-10-20 SURGICAL SUPPLY — 15 items

## 2017-10-20 NOTE — Transfer of Care (Signed)
Immediate Anesthesia Transfer of Care Note  Patient: [name]  Procedure(s) Performed: ESOPHAGOGASTRODUODENOSCOPY (EGD) WITH PROPOFOL (N/A ) BIOPSY  Patient Location: PACU  Anesthesia Type:MAC  Level of Consciousness: sedated  Airway & Oxygen Therapy: Patient Spontanous Breathing and Patient connected to nasal cannula oxygen  Post-op Assessment: Report given to RN and Post -op Vital signs reviewed and stable  Post vital signs: Reviewed and stable  Last Vitals:  Vitals Value Taken Time  BP    Temp    Pulse 61 10/20/2017 11:46 AM  Resp 13 10/20/2017 11:46 AM  SpO2 98 % 10/20/2017 11:46 AM  Vitals shown include unvalidated device data.  Last Pain:  Vitals:   10/20/17 1030  TempSrc: Oral  PainSc: 0-No pain         Complications: No apparent anesthesia complications

## 2017-10-20 NOTE — Interval H&P Note (Signed)
History and Physical Interval Note:  10/20/2017 11:20 AM  [name]  has presented today for surgery, with the diagnosis of melena  The various methods of treatment have been discussed with the patient and family. After consideration of risks, benefits and other options for treatment, the patient has consented to  Procedure(s): ESOPHAGOGASTRODUODENOSCOPY (EGD) WITH PROPOFOL (N/A) as a surgical intervention .  [name] history has been reviewed, patient examined, no change in status, stable for surgery.  I have reviewed the patient's chart and labs.  Questions were answered to the patient's satisfaction.     [name]

## 2017-10-20 NOTE — Discharge Instructions (Signed)
YOU HAD AN ENDOSCOPIC PROCEDURE TODAY: Refer to the procedure report and other information in the discharge instructions given to you for any specific questions about what was found during the examination. If this information does not answer your questions, please call [place] office at [phone] to clarify.  ° °YOU SHOULD EXPECT: Some feelings of bloating in the abdomen. Passage of more gas than usual. Walking can help get rid of the air that was put into your GI tract during the procedure and reduce the bloating. If you had a lower endoscopy (such as a colonoscopy or flexible sigmoidoscopy) you may notice spotting of blood in your stool or on the toilet paper. Some abdominal soreness may be present for a day or two, also. ° °DIET: Your first meal following the procedure should be a light meal and then it is ok to progress to your normal diet. A half-sandwich or bowl of soup is an example of a good first meal. Heavy or fried foods are harder to digest and may make you feel nauseous or bloated. Drink plenty of fluids but you should avoid alcoholic beverages for 24 hours. If you had a esophageal dilation, please see attached instructions for diet.   ° °ACTIVITY: Your care partner should take you home directly after the procedure. You should plan to take it easy, moving slowly for the rest of the day. You can resume normal activity the day after the procedure however YOU SHOULD NOT DRIVE, use power tools, machinery or perform tasks that involve climbing or major physical exertion for 24 hours (because of the sedation medicines used during the test).  ° °SYMPTOMS TO REPORT IMMEDIATELY: °A gastroenterologist can be reached at any hour. Please call [phone]  for any of the following symptoms:  °Following lower endoscopy (colonoscopy, flexible sigmoidoscopy) °Excessive amounts of blood in the stool  °Significant tenderness, worsening of abdominal pains  °Swelling of the abdomen that is new, acute  °Fever of 100° or  higher  °Following upper endoscopy (EGD, EUS, ERCP, esophageal dilation) °Vomiting of blood or coffee ground material  °New, significant abdominal pain  °New, significant chest pain or pain under the shoulder blades  °Painful or persistently difficult swallowing  °New shortness of breath  °Black, tarry-looking or red, bloody stools ° °FOLLOW UP:  °If any biopsies were taken you will be contacted by phone or by letter within the next 1-3 weeks. Call [phone]  if you have not heard about the biopsies in 3 weeks.  °Please also call with any specific questions about appointments or follow up tests. ° °

## 2017-10-20 NOTE — Op Note (Addendum)
[facility] Patient Name: [name] Procedure Date: 10/20/2017 MRN: [id number] Attending MD: Estill Cotta. Loletha Carrow , MD Date of Birth: [birth year] CSN: [id number] Age: 83 Admit Type: Outpatient Procedure:                Upper GI endoscopy Indications:              Acute post hemorrhagic anemia, Heartburn, Heme                            positive stool, Melena Providers:                Mallie Mussel L. Loletha Carrow, MD, Angus Seller, William Dalton, Technician Referring MD:             Claris Gower, MD Medicines:                Monitored Anesthesia Care Complications:            No immediate complications. Estimated Blood Loss:     Estimated blood loss was minimal. Procedure:                Pre-Anesthesia Assessment:                           - Prior to the procedure, a History and Physical                            was performed, and patient medications and                            allergies were reviewed. [name] tolerance of                            previous anesthesia was also reviewed. The risks                            and benefits of the procedure and the sedation                            options and risks were discussed with the patient.                            All questions were answered, and informed consent                            was obtained. Prior Anticoagulants: The patient                            last took aspirin on the day of the procedure and                            Plavix (clopidogrel) on the day of the procedure                            (  recent drug-eluting coronary stent placement). ASA                            Grade Assessment: III - A patient with severe                            systemic disease. After reviewing the risks and                            benefits, the patient was deemed in satisfactory                            condition to undergo the procedure.                           After  obtaining informed consent, the endoscope was                            passed under direct vision. Throughout the                            procedure, the patient's blood pressure, pulse, and                            oxygen saturations were monitored continuously. The                            GIF-H190 ([id number]) Olympus adult endoscope was                            introduced through the mouth, and advanced to the                            second part of duodenum. The upper GI endoscopy was                            accomplished without difficulty. The patient                            tolerated the procedure well. Scope In: Scope Out: Findings:      A large, fungating mass with no bleeding was found in the middle third       of the esophagus, 27 to 32 cm from the incisors. The mass was partially       obstructing and partially circumferential (involving one-half of the       lumen circumference, and about half the cross-sectional area of the       esophageal lumen). EGJ at 40cm. Biopsies were taken with a cold forceps       for histology.      The exam of the esophagus was otherwise normal.      The stomach was normal.      The examined duodenum was normal.      The cardia and gastric fundus were normal on retroflexion. 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 Biopsied.                           - Normal stomach.                           - Normal examined duodenum. Moderate Sedation:      MAC sedation used Recommendation:           - Patient has a contact number available for                            emergencies. The signs and symptoms of potential                            delayed complications were discussed with the                            patient. Return to normal activities tomorrow.                            Written  discharge instructions were provided to the                            patient.                           - Soft diet.                           - Continue present medications.                           - Await pathology results.                           - CBC in one week. Procedure Code(s):        --- Professional ---                           [phone], Esophagogastroduodenoscopy, flexible,                            transoral; with biopsy, single or multiple Diagnosis Code(s):        --- Professional ---                           D49.0, Neoplasm of unspecified behavior of                            digestive system  D62, Acute posthemorrhagic anemia                           R12, Heartburn                           R19.5, Other fecal abnormalities                           K92.1, Melena (includes Hematochezia) CPT copyright 2017 American Medical Association. All rights reserved. The codes documented in this report are preliminary and upon coder review may  be revised to meet current compliance requirements. Ellenora Talton L. Loletha Carrow, MD 10/20/2017 11:48:29 AM This report has been signed electronically. Number of Addenda: 0

## 2017-10-20 NOTE — Anesthesia Postprocedure Evaluation (Signed)
Anesthesia Post Note  Patient: [name]  Procedure(s) Performed: ESOPHAGOGASTRODUODENOSCOPY (EGD) WITH PROPOFOL (N/A ) BIOPSY     Patient location during evaluation: PACU Anesthesia Type: MAC Level of consciousness: awake and alert Pain management: pain level controlled Vital Signs Assessment: post-procedure vital signs reviewed and stable Respiratory status: spontaneous breathing, nonlabored ventilation, respiratory function stable and patient connected to nasal cannula oxygen Cardiovascular status: stable and blood pressure returned to baseline Postop Assessment: no apparent nausea or vomiting Anesthetic complications: no    Last Vitals:  Vitals:   10/20/17 1030 10/20/17 1145  BP: (!) 156/74 116/63  Pulse: 65 (!) 59  Resp: 16 20  Temp: 37.3 C   SpO2: 97% 98%    Last Pain:  Vitals:   10/20/17 1145  TempSrc: Oral  PainSc: 0-No pain                 Tomia Enlow S

## 2017-10-20 NOTE — H&P (Signed)
History:  This patient presents for endoscopic testing for melena, anemia, heme positive stool. See Office consult note from our practice last week.  [name] Referring physician: [name], MD  Past Medical History: Past Medical History:  Diagnosis Date  . AAA (abdominal aortic aneurysm) ([place])    a. Last duplex - 3.8cm 01/2013 - due 01/2014.  [name] Acne rosacea   . Alcoholic hepatitis with ascites 08/28/2017  . Anginal pain ([place]) 1996  . Aortic stenosis, mild    Last echo 05/04/12 +LVH  . Arthritis    "shoulders" (09/01/2017)  . Back pain    hx epidural injections  . Baker's cyst    Lt.  [name] CAD (coronary artery disease)    a. s/p MI/PTCA - Cx 1994. b. s/p CABG x5 in 1997. c. Abnl nuc 2003- occ SVG-RCA, patent seq SVG-OM1-dCxOM, patentl LIMA-LAD, patent, SVG-diag. d. Normal nuc 09/2012.  . Carotid artery disease ([place])    a. Duplex 01/2013: mildly abnormal. Mild plaque without significant diameter reduction. Repeat recommended 11/15.  [name] COPD (chronic obstructive pulmonary disease) ([place])   . Dizziness and giddiness 08/04/2013  . GERD (gastroesophageal reflux disease)   . H/O: GI bleed    mild, neg. colonoscopy  . Heart murmur   . HOH (hard of hearing)   . HTN (hypertension)   . Hyperlipidemia   . Hypertensive heart disease   . Myocardial infarction ([place]) 1994  . OSA on CPAP   . OSA treated with BiPAP 11/20/2015   Severe with [facility] 38/hr  . PAF (paroxysmal atrial fibrillation) ([place])    a. Remote per records.   . Pneumonia    history  . Presence of permanent cardiac pacemaker    DOI 1999 with change out 2006; St Jude  . Prostate cancer ([place]) ~ 2010   a. s/p radical retropubic prostatectomy with bilateral pelvic lymph node dissection  . PVD (peripheral vascular disease) ([place])    a. Rt ext iliac stenosis, last PV cath 2003, moderate stenosis last Lower Ext Dopplers 12/16/11 - Rt. ABI 0.96  Lt ABI 1.0.  . Sick sinus syndrome ([place])    a. placed 1999, gen change 2011 -  St. Jude.  . Sinus node dysfunction ([place]) 01/20/2013  . Syncope 11/28/97  . Thrombocytopenia (HCC)    chronic, mild  . Valvular heart disease    a. Echo 04/2012: mild-mod AS, mild AI, mild MR.     Past Surgical History: Past Surgical History:  Procedure Laterality Date  . APPENDECTOMY    . CARDIAC CATHETERIZATION  2003   VG to RCA occluded, other grafts patent  . CARDIAC CATHETERIZATION N/A 05/04/2015   Procedure: Right/Left Heart Cath and Coronary/Graft Angiography;  Surgeon: Sherren Mocha, MD;  Location: [facility];  Service: Cardiovascular;  Laterality: N/A;  . CARDIAC CATHETERIZATION  09/01/2017  . CARDIAC VALVE REPLACEMENT    . CATARACT EXTRACTION W/ INTRAOCULAR LENS  IMPLANT, BILATERAL Bilateral   . COLONOSCOPY    . CORONARY ANGIOPLASTY  03/29/92   PTCA to LCX  . CORONARY ARTERY BYPASS GRAFT  1997   LIMA-LAD; VG-diag; seq VG-1st OM & distal LCX; VG-RCA  . CORONARY ATHERECTOMY N/A 09/01/2017   Procedure: CORONARY ATHERECTOMY;  Surgeon: Jettie Booze, MD;  Location: [facility];  Service: Cardiovascular;  Laterality: N/A;  . CORONARY ATHERECTOMY  10/01/2017  . CORONARY ATHERECTOMY N/A 10/01/2017   Procedure: CORONARY ATHERECTOMY;  Surgeon: Leonie Man, MD;  Location: [facility];  Service: Cardiovascular;  Laterality: N/A;  . INSERT / REPLACE / REMOVE PACEMAKER  11/28/1997   pacesetter--ERI 2011  . LEFT HEART CATH N/A 09/01/2017   Procedure: Left Heart Cath;  Surgeon: Jettie Booze, MD;  Location: [facility];  Service: Cardiovascular;  Laterality: N/A;  . LEFT HEART CATH AND CORS/GRAFTS ANGIOGRAPHY N/A 08/28/2017   Procedure: LEFT HEART CATH AND CORS/GRAFTS ANGIOGRAPHY;  Surgeon: Leonie Man, MD;  Location: [facility];  Service: Cardiovascular;  Laterality: N/A;  . PACEMAKER GENERATOR CHANGE  08/15/2009   St. Jude accent  . SUPRAPUBIC PROSTATECTOMY    . TEE WITHOUT CARDIOVERSION N/A 06/12/2015   Procedure: TRANSESOPHAGEAL  ECHOCARDIOGRAM (TEE);  Surgeon: Sherren Mocha, MD;  Location: [facility];  Service: Open Heart Surgery;  Laterality: N/A;  . TONSILLECTOMY    . TRANSCATHETER AORTIC VALVE REPLACEMENT, TRANSFEMORAL N/A 06/12/2015   Procedure: TRANSCATHETER AORTIC VALVE REPLACEMENT, TRANSFEMORAL, possible transpical;  Surgeon: Sherren Mocha, MD;  Location: [facility];  Service: Open Heart Surgery;  Laterality: N/A;    Allergies: No Known Allergies  Outpatient Meds: Current Facility-Administered Medications  Medication Dose Route Frequency Provider Last Rate Last Dose  . lactated ringers infusion   Intravenous Continuous [name], MD 20 mL/hr at 10/20/17 1038        ___________________________________________________________________ Objective   Exam:  BP (!) 156/74   Pulse 65   Temp 99.2 F (37.3 C) (Oral)   Resp 16   Ht 5\' 6"  (1.676 m)   Wt 190 lb (86.2 kg)   SpO2 97%   BMI 30.67 kg/m    CV: RRR with soft systolic murmur, H2/C9, no JVD, no peripheral edema  Resp: clear to auscultation bilaterally, normal RR and effort noted  GI: soft, no tenderness, with active bowel sounds. No guarding or palpable organomegaly noted.  Neuro: awake, alert and oriented x 3. Normal gross motor function and fluent speech   Assessment:  Melena, Heme positive stool, anemia  Plan:  EGD while on aspirin and plavix.   [name]

## 2017-10-20 NOTE — Anesthesia Preprocedure Evaluation (Signed)
Anesthesia Evaluation    Airway Mallampati: II  TM Distance: >3 FB Neck ROM: Full    Dental no notable dental hx.    Pulmonary sleep apnea and Continuous Positive Airway Pressure Ventilation , former smoker,    Pulmonary exam normal breath sounds clear to auscultation       Cardiovascular hypertension, + CAD, + Past MI, + CABG and + Peripheral Vascular Disease  + pacemaker + Valvular Problems/Murmurs AS  Rhythm:Regular Rate:Normal + Systolic murmurs EF 31-54%   Neuro/Psych    GI/Hepatic (+) Cirrhosis   ascites  substance abuse  alcohol use, Hepatitis -  Endo/Other    Renal/GU      Musculoskeletal   Abdominal   Peds  Hematology   Anesthesia Other Findings   Reproductive/Obstetrics                             Anesthesia Physical Anesthesia Plan  ASA: IV  Anesthesia Plan: MAC   Post-op Pain Management:    Induction: Intravenous  PONV Risk Score and Plan: 0  Airway Management Planned: Simple Face Mask  Additional Equipment:   Intra-op Plan:   Post-operative Plan:   Informed Consent: I have reviewed the patients History and Physical, chart, labs and discussed the procedure including the risks, benefits and alternatives for the proposed anesthesia with the patient or authorized representative who has indicated his/her understanding and acceptance.   Dental advisory given  Plan Discussed with: CRNA and Surgeon  Anesthesia Plan Comments:         Anesthesia Quick Evaluation

## 2017-10-21 ENCOUNTER — Encounter (HOSPITAL_COMMUNITY): Payer: Self-pay | Admitting: Gastroenterology

## 2017-10-22 ENCOUNTER — Other Ambulatory Visit: Payer: Self-pay

## 2017-10-22 ENCOUNTER — Ambulatory Visit (INDEPENDENT_AMBULATORY_CARE_PROVIDER_SITE_OTHER): Payer: Medicare Other | Admitting: Cardiology

## 2017-10-22 ENCOUNTER — Encounter: Payer: Self-pay | Admitting: Cardiology

## 2017-10-22 ENCOUNTER — Telehealth: Payer: Self-pay | Admitting: Gastroenterology

## 2017-10-22 VITALS — BP 126/74 | HR 64 | Ht 66.0 in | Wt 190.0 lb

## 2017-10-22 DIAGNOSIS — I2510 Atherosclerotic heart disease of native coronary artery without angina pectoris: Secondary | ICD-10-CM

## 2017-10-22 DIAGNOSIS — I10 Essential (primary) hypertension: Secondary | ICD-10-CM

## 2017-10-22 DIAGNOSIS — G4733 Obstructive sleep apnea (adult) (pediatric): Secondary | ICD-10-CM

## 2017-10-22 DIAGNOSIS — E785 Hyperlipidemia, unspecified: Secondary | ICD-10-CM | POA: Diagnosis not present

## 2017-10-22 DIAGNOSIS — Z952 Presence of prosthetic heart valve: Secondary | ICD-10-CM | POA: Diagnosis not present

## 2017-10-22 DIAGNOSIS — C159 Malignant neoplasm of esophagus, unspecified: Secondary | ICD-10-CM

## 2017-10-22 LAB — LIPID PANEL
Chol/HDL Ratio: 2.8 ratio (ref 0.0–5.0)
Cholesterol, Total: 120 mg/dL (ref 100–199)
HDL: 43 mg/dL (ref >39)
LDL Calculated: 48 mg/dL (ref 0–99)
Triglycerides: 144 mg/dL (ref 0–149)
VLDL CHOLESTEROL CAL: 29 mg/dL (ref 5–40)

## 2017-10-22 NOTE — Patient Instructions (Signed)
Medication Instructions: Your physician recommends that you continue on your current medications as directed. Please refer to the Current Medication list given to you today.   Labwork: TODAY: Lipids  Procedures/Testing: None  Follow-Up: Keep follow up appointment with Dr.Turner on 12/07/17   Any Additional Special Instructions Will Be Listed Below (If Applicable).     If you need a refill on your cardiac medications before your next appointment, please call your pharmacy.

## 2017-10-22 NOTE — Telephone Encounter (Signed)
[name],    I spoke to [name] this AM about his biopsy showing esophageal cancer.    Please send referral to Oncology for Esophageal Squamous Cancer, and schedule CT scan chest, abdomen and pelvis with oral and IV contrast for esophageal cancer staging.  He needs a CBC early next week for anemia of chronic blood loss.   His last BUN/Cr were normal on 10/02/17 (for IV contrast).  If updated one needed for CT scan, please do it with the CBC.

## 2017-10-22 NOTE — Progress Notes (Signed)
Cardiology Office Note:    Date:  10/22/2017   ID:  [name], DOB October 02, 1934, MRN [id number]  PCP:  [name], MD  Cardiologist:  Fransico Him, MD  Referring MD: [name], *   Chief Complaint  Patient presents with  . Hospitalization Follow-up    Post PCI    History of Present Illness:    [name] is a 83 y.o. male with a past medical history significant for CAD s/p PTCA and CABG (0630,1601) nowwith chronic occlusion of SVG to RCA per cardiac cath 2017(see below),AVSs/p [id number], HTN, remote history of PAF not on anticoagulationsecondary to nosebleeds, COPD, sleep apnea on nocturnal BiPAP, remote prostate CAs/pprostatectomy and SSSs/p PPM.  Had complaints of chest pain in June and underwent a stress test which was abnormal.  He therefore underwent cardiac catheterization 08/28/2017 08/28/2017 that showed known occlusion of SVG-RCA, widely patent LIMA-LAD, SVG- 1st diag, Seq SVG-RI-OM3.  On 09/01/2017 Dr. Irish Lack attempted to open his RCA but was unsuccessful.  He continued to have complaints of chest pain and was arranged for a repeated attempt at PCI of the RCA.  On 10/02/2017 he underwent successful difficult PCI of ostial-proximal RCA with rotational atherectomy and DES stent placement by Dr. Ellyn Hack. Recommendation is for dual antiplatelet therapy with aspirin 81 mg and clopidogrel 75 mg long-term (beyond 12 months) because of ostial stent and diffuse disease elsewhere.  Dr. Ellyn Hack noted that it would be okay to stop aspirin after 3-6 months.  Imdur was stopped.  Since then the patient developed melena and increasing burning epigastric pain and has been evaluated by GI.  An upper endoscopy on 10/20/2017 showed an esophageal tumor, likely malignant.  He had a drop in hemoglobin to 10.8 but this improved back to 11.8.  Pt was called this am and told that biopsy is positive for cancer. Plan for CT scan and possibly arrange chemo and/or radiation.  Apparently no surgery planned at this time. Still having epigastric pain/burning and indigestion. On Protonix. Still having dark stools. Is on iron. No trouble swallowing but is on soft diet.   He feels much better after the stent with improved breathing and no substernal chest pain. Can walk to the mailbox and back without dyspnea. No orthopnea, PND or edema.    Past Medical History:  Diagnosis Date  . AAA (abdominal aortic aneurysm) ([place])    a. Last duplex - 3.8cm 01/2013 - due 01/2014.  [name] Acne rosacea   . Alcoholic hepatitis with ascites 08/28/2017  . Anginal pain ([place]) 1996  . Aortic stenosis, mild    Last echo 05/04/12 +LVH  . Arthritis    "shoulders" (09/01/2017)  . Back pain    hx epidural injections  . Baker's cyst    Lt.  [name] CAD (coronary artery disease)    a. s/p MI/PTCA - Cx 1994. b. s/p CABG x5 in 1997. c. Abnl nuc 2003- occ SVG-RCA, patent seq SVG-OM1-dCxOM, patentl LIMA-LAD, patent, SVG-diag. d. Normal nuc 09/2012.  . Carotid artery disease ([place])    a. Duplex 01/2013: mildly abnormal. Mild plaque without significant diameter reduction. Repeat recommended 11/15.  [name] COPD (chronic obstructive pulmonary disease) ([place])   . Dizziness and giddiness 08/04/2013  . GERD (gastroesophageal reflux disease)   . H/O: GI bleed    mild, neg. colonoscopy  . Heart murmur   . HOH (hard of hearing)   . HTN (hypertension)   . Hyperlipidemia   . Hypertensive heart disease   . Myocardial infarction ([place])  1994  . OSA on CPAP   . OSA treated with BiPAP 11/20/2015   Severe with [facility] 38/hr  . PAF (paroxysmal atrial fibrillation) ([place])    a. Remote per records.   . Pneumonia    history  . Presence of permanent cardiac pacemaker    DOI 1999 with change out 2006; St Jude  . Prostate cancer ([place]) ~ 2010   a. s/p radical retropubic prostatectomy with bilateral pelvic lymph node dissection  . PVD (peripheral vascular disease) ([place])    a. Rt ext iliac stenosis, last PV cath 2003, moderate stenosis  last Lower Ext Dopplers 12/16/11 - Rt. ABI 0.96  Lt ABI 1.0.  . Sick sinus syndrome ([place])    a. placed 1999, gen change 2011 - St. Jude.  . Sinus node dysfunction ([place]) 01/20/2013  . Syncope 11/28/97  . Thrombocytopenia (HCC)    chronic, mild  . Valvular heart disease    a. Echo 04/2012: mild-mod AS, mild AI, mild MR.    Past Surgical History:  Procedure Laterality Date  . APPENDECTOMY    . BIOPSY  10/20/2017   Procedure: BIOPSY;  Surgeon: Doran Stabler, MD;  [facility]: [facility];  Service: Gastroenterology;;  . CARDIAC CATHETERIZATION  2003   VG to RCA occluded, other grafts patent  . CARDIAC CATHETERIZATION N/A 05/04/2015   Procedure: Right/Left Heart Cath and Coronary/Graft Angiography;  Surgeon: Sherren Mocha, MD;  Location: [facility];  Service: Cardiovascular;  Laterality: N/A;  . CARDIAC CATHETERIZATION  09/01/2017  . CARDIAC VALVE REPLACEMENT    . CATARACT EXTRACTION W/ INTRAOCULAR LENS  IMPLANT, BILATERAL Bilateral   . COLONOSCOPY    . CORONARY ANGIOPLASTY  03/29/92   PTCA to LCX  . CORONARY ARTERY BYPASS GRAFT  1997   LIMA-LAD; VG-diag; seq VG-1st OM & distal LCX; VG-RCA  . CORONARY ATHERECTOMY N/A 09/01/2017   Procedure: CORONARY ATHERECTOMY;  Surgeon: Jettie Booze, MD;  Location: [facility];  Service: Cardiovascular;  Laterality: N/A;  . CORONARY ATHERECTOMY  10/01/2017  . CORONARY ATHERECTOMY N/A 10/01/2017   Procedure: CORONARY ATHERECTOMY;  Surgeon: Leonie Man, MD;  Location: [facility];  Service: Cardiovascular;  Laterality: N/A;  . ESOPHAGOGASTRODUODENOSCOPY (EGD) WITH PROPOFOL N/A 10/20/2017   Procedure: ESOPHAGOGASTRODUODENOSCOPY (EGD) WITH PROPOFOL;  Surgeon: Doran Stabler, MD;  [facility];  Service: Gastroenterology;  Laterality: N/A;  . INSERT / REPLACE / REMOVE PACEMAKER  11/28/1997   pacesetter--ERI 2011  . LEFT HEART CATH N/A 09/01/2017   Procedure: Left Heart Cath;  Surgeon: Jettie Booze, MD;   Location: [facility];  Service: Cardiovascular;  Laterality: N/A;  . LEFT HEART CATH AND CORS/GRAFTS ANGIOGRAPHY N/A 08/28/2017   Procedure: LEFT HEART CATH AND CORS/GRAFTS ANGIOGRAPHY;  Surgeon: Leonie Man, MD;  Location: [facility];  Service: Cardiovascular;  Laterality: N/A;  . PACEMAKER GENERATOR CHANGE  08/15/2009   St. Jude accent  . SUPRAPUBIC PROSTATECTOMY    . TEE WITHOUT CARDIOVERSION N/A 06/12/2015   Procedure: TRANSESOPHAGEAL ECHOCARDIOGRAM (TEE);  Surgeon: Sherren Mocha, MD;  Location: [facility];  Service: Open Heart Surgery;  Laterality: N/A;  . TONSILLECTOMY    . TRANSCATHETER AORTIC VALVE REPLACEMENT, TRANSFEMORAL N/A 06/12/2015   Procedure: TRANSCATHETER AORTIC VALVE REPLACEMENT, TRANSFEMORAL, possible transpical;  Surgeon: Sherren Mocha, MD;  Location: [facility];  Service: Open Heart Surgery;  Laterality: N/A;    Current Medications: Current Meds  Medication Sig  . acetaminophen (TYLENOL) 325 MG tablet  Take 650 mg by mouth 2 (two) times daily.  [name] aspirin EC 81 MG EC tablet Take 1 tablet (81 mg total) by mouth daily.  [name] atorvastatin (LIPITOR) 40 MG tablet Take 1 tablet (40 mg total) by mouth daily at 6 PM.  . clopidogrel (PLAVIX) 75 MG tablet Take 1 tablet (75 mg total) by mouth daily.  . ferrous sulfate 325 (65 FE) MG EC tablet Take 325 mg by mouth daily.  . metoprolol tartrate (LOPRESSOR) 25 MG tablet TAKE 1 TABLET BY MOUTH TWICE DAILY  . nitroGLYCERIN (NITROSTAT) 0.4 MG SL tablet Place 1 tablet (0.4 mg total) under the tongue every 5 (five) minutes as needed for chest pain.  . Omega-3 Fatty Acids (FISH OIL) 1200 MG CAPS Take 1,200 mg by mouth every evening.  . pantoprazole (PROTONIX) 40 MG tablet Take 1 tablet (40 mg total) by mouth daily.  [name] triamterene-hydrochlorothiazide (MAXZIDE-25) 37.5-25 MG tablet TAKE 1 TABLET BY MOUTH DAILY MONDAY THROUGH FRIDAY (Patient taking differently: Take 1 tablet by mouth every Monday, Tuesday, Wednesday, Thursday, and Friday.  )  . vitamin C (ASCORBIC ACID) 500 MG tablet Take 500 mg by mouth every evening.     Allergies:   Patient has no known allergies.   Social History   Socioeconomic History  . Marital status: Widowed    Spouse name: Not on file  . Number of children: 5  . Years of education: 7th  . Highest education level: Not on file  Occupational History  . Occupation: Retired  Scientific laboratory technician  . Financial resource strain: Not on file  . Food insecurity:    Worry: Not on file    Inability: Not on file  . Transportation needs:    Medical: Not on file    Non-medical: Not on file  Tobacco Use  . Smoking status: Former Smoker    Packs/day: 2.00    Years: 48.00    Pack years: 96.00    Types: Cigarettes    Last attempt to quit: 03/18/1995    Years since quitting: 22.6  . Smokeless tobacco: Never Used  Substance and Sexual Activity  . Alcohol use: No    Comment: 09/01/2017 "nothing in the last couple years"  . Drug use: Never  . Sexual activity: Not Currently  Lifestyle  . Physical activity:    Days per week: Not on file    Minutes per session: Not on file  . Stress: Not on file  Relationships  . Social connections:    Talks on phone: Not on file    Gets together: Not on file    Attends religious service: Not on file    Active member of club or organization: Not on file    Attends meetings of clubs or organizations: Not on file    Relationship status: Not on file  Other Topics Concern  . Not on file  Social History Narrative  . Not on file     Family History: [name] family history includes CAD in his unknown relative; Cancer in his brother; Colon cancer in his father; Heart Problems in his brother and sister. ROS:   Please see the history of present illness.     All other systems reviewed and are negative.  EKGs/Labs/Other Studies Reviewed:    The following studies were reviewed today: CORONARY ATHERECTOMY  Conclusion    Ost RCA to Prox RCA lesion is 95% stenosed. Post  Rotational Atherectomy intervention, there is a 50% residual stenosis.  A drug-eluting stent was  successfully placed using a STENT SYNERGY DES 3X24. post-dilated to 3.6 mm  Post intervention, there is a 0% residual stenosis.  Prox RCA lesion is 30% stenosed just beyond the stent.  Successful difficult PCI of ostial-proximal RCA with rotational atherectomy and DES stent placement.  Plan: Overnight observation post PCI. Sheath removal in 6 [facility] post procedure unit. Continue antianginal medications and risk factor modification.  Recommend dual antiplatelet therapy with Aspirin 81mg  daily and Clopidogrel 75mg  dailylong-term (beyond 12 months) because of Ostial stent and diffuse disease elsewhere. Would be okay to stop aspirin after 3-6 months..      EKG:  EKG is not ordered today.    Recent Labs: 08/25/2017: ALT 29; B Natriuretic Peptide 121.7 10/02/2017: BUN 17; Creatinine, Ser 0.94; Potassium 4.0; Sodium 139 10/13/2017: Hemoglobin 11.8; Platelets 142.0   Recent Lipid Panel    Component Value Date/Time   CHOL 129 04/26/2015 1620   TRIG 207 (H) 04/26/2015 1620   HDL 39 (L) 04/26/2015 1620   CHOLHDL 3.3 04/26/2015 1620   VLDL 41 (H) 04/26/2015 1620   LDLCALC 49 04/26/2015 1620    Physical Exam:    VS:  BP 126/74   Pulse 64   Ht 5\' 6"  (1.676 m)   Wt 190 lb (86.2 kg)   BMI 30.67 kg/m     Wt Readings from Last 3 Encounters:  10/22/17 190 lb (86.2 kg)  10/20/17 190 lb (86.2 kg)  10/13/17 190 lb (86.2 kg)     Physical Exam  Constitutional: He is oriented to person, place, and time. He appears well-developed and well-nourished.  HENT:  Head: Normocephalic and atraumatic.  Neck: Normal range of motion. Neck supple. No JVD present.  Cardiovascular: Normal rate, regular rhythm and intact distal pulses.  Murmur heard.  Systolic murmur is present with a grade of 2/6 at the apex. Pulmonary/Chest: Effort normal and breath sounds normal. No respiratory distress. He has  no wheezes. He has no rales.  Abdominal: Soft. Bowel sounds are normal.  Musculoskeletal: Normal range of motion. He exhibits no edema or deformity.  Neurological: He is alert and oriented to person, place, and time.  Skin: Skin is warm and dry.  Psychiatric: He has a normal mood and affect. His behavior is normal. Judgment and thought content normal.  Vitals reviewed.   ASSESSMENT:    1. CAD, multiple vessel   2. Hyperlipidemia, unspecified hyperlipidemia type   3. Essential hypertension   4. S/P TAVR (transcatheter aortic valve replacement)   5. OSA treated with BiPAP    PLAN:    In order of problems listed above:  CAD: s/p past CABG with known occlusion of SVG-RCA, widely patent LIMA-LAD, SVG- 1st diag, Seq SVG-RI-OM3.   On 10/02/2017 he underwent successful difficult PCI of ostial-proximal RCA with rotational atherectomy and DES stent placement. On dual antiplatelet therapy with Plavix and aspirin 81 mg.  He continues on beta-blocker, statin. Substernal chest pain and dyspnea have resolved. He still has epigastric burning.  Unfortunately he has just been diagnosed with esophageal cancer and will likely need chemo and/or radiation. No talk of surgery at this time. If he were to need invasive procedure and interruption in Plavix we would need a discussion with Dr. Radford Pax and the requesting provider. According to Dr. Ellyn Hack the patient should remain on Plavix indefinitely, but could potentially stop aspirin 3-6 months after stent placement.   Hypertension: metoprolol 25 mg, triamterene-hydrochlorothiazide 25 mg. BP well controlled.   Dyslipidemia: atorvastatin 40 mg switched from Zocor  in June.  Will check lipid panel.   S/P TAVR 2017: bioprosthetic AV functioning normally by echo in 08/2017.   Dual-chamber pacemaker: followed by Dr. [name]  OSA: Uses CPAP every night. Followed by Dr. Radford Pax.  Medication Adjustments/Labs and Tests Ordered: Current medicines are reviewed at length  with the patient today.  Concerns regarding medicines are outlined above. Labs and tests ordered and medication changes are outlined in the patient instructions below:  Patient Instructions  Medication Instructions: Your physician recommends that you continue on your current medications as directed. Please refer to the Current Medication list given to you today.   Labwork: TODAY: Lipids  Procedures/Testing: None  Follow-Up: Keep follow up appointment with Dr.Turner on 12/07/17   Any Additional Special Instructions Will Be Listed Below (If Applicable).     If you need a refill on your cardiac medications before your next appointment, please call your pharmacy.      Signed, Daune Perch, NP  10/22/2017 12:09 PM    [facility]

## 2017-10-22 NOTE — Telephone Encounter (Signed)
Spoke to patient, he is aware that he is scheduled for CT chest/ab/pelvis at [facility] on 8/12 arrive at 2:45 for a 3:00 test. He will come by the office to pick up prep instructions and contrast. He also understands to come by the office either Monday or Tuesday next week for recheck CBC. Referral placed to oncology, patient aware to expect a call from them.

## 2017-10-23 ENCOUNTER — Other Ambulatory Visit (INDEPENDENT_AMBULATORY_CARE_PROVIDER_SITE_OTHER): Payer: Medicare Other

## 2017-10-23 ENCOUNTER — Telehealth: Payer: Self-pay | Admitting: Oncology

## 2017-10-23 ENCOUNTER — Encounter: Payer: Self-pay | Admitting: Oncology

## 2017-10-23 DIAGNOSIS — C159 Malignant neoplasm of esophagus, unspecified: Secondary | ICD-10-CM

## 2017-10-23 LAB — CBC WITH DIFFERENTIAL/PLATELET
BASOS ABS: 0 10*3/uL (ref 0.0–0.1)
Basophils Relative: 0.7 % (ref 0.0–3.0)
Eosinophils Absolute: 0.1 10*3/uL (ref 0.0–0.7)
Eosinophils Relative: 2.1 % (ref 0.0–5.0)
HCT: 34.2 % — ABNORMAL LOW (ref 39.0–52.0)
Hemoglobin: 11.7 g/dL — ABNORMAL LOW (ref 13.0–17.0)
LYMPHS ABS: 1.1 10*3/uL (ref 0.7–4.0)
Lymphocytes Relative: 32.5 % (ref 12.0–46.0)
MCHC: 34.4 g/dL (ref 30.0–36.0)
MCV: 95.3 fl (ref 78.0–100.0)
MONO ABS: 0.5 10*3/uL (ref 0.1–1.0)
Monocytes Relative: 14.7 % — ABNORMAL HIGH (ref 3.0–12.0)
NEUTROS PCT: 50 % (ref 43.0–77.0)
Neutro Abs: 1.7 10*3/uL (ref 1.4–7.7)
Platelets: 107 10*3/uL — ABNORMAL LOW (ref 150.0–400.0)
RBC: 3.59 Mil/uL — ABNORMAL LOW (ref 4.22–5.81)
RDW: 13.6 % (ref 11.5–15.5)
WBC: 3.4 10*3/uL — ABNORMAL LOW (ref 4.0–10.5)

## 2017-10-23 NOTE — Telephone Encounter (Signed)
New referral received from Dr. Loletha Carrow pt has been scheduled for the pt to see Dr. Benay Spice on 8/14 at [place] ([facility] Navigator) will follow up with the appt information.

## 2017-10-23 NOTE — Progress Notes (Signed)
Spoke with patient to advise of initial consult with Dr. Benay Spice on 10/28/17 @ 8 AM. Patient aware to arrive 30 minutes prior. Referral placed for rad/onc. Role of navigation explained and my contact information provided for questions or concerns.

## 2017-10-26 ENCOUNTER — Encounter (HOSPITAL_COMMUNITY): Payer: Self-pay

## 2017-10-26 ENCOUNTER — Ambulatory Visit (HOSPITAL_COMMUNITY)
Admission: RE | Admit: 2017-10-26 | Discharge: 2017-10-26 | Disposition: A | Discharge status: Home / Self Care | Payer: Medicare Other | Source: Ambulatory Visit | Attending: Gastroenterology | Admitting: Gastroenterology

## 2017-10-26 DIAGNOSIS — I700 Atherosclerosis of aorta: Secondary | ICD-10-CM | POA: Insufficient documentation

## 2017-10-26 DIAGNOSIS — K7689 Other specified diseases of liver: Secondary | ICD-10-CM | POA: Diagnosis not present

## 2017-10-26 DIAGNOSIS — C159 Malignant neoplasm of esophagus, unspecified: Secondary | ICD-10-CM | POA: Diagnosis present

## 2017-10-26 MED ORDER — IOHEXOL 300 MG/ML  SOLN
100.0000 mL | Freq: Once | INTRAMUSCULAR | Status: AC | PRN
  Administered 2017-10-26: 100 mL via INTRAVENOUS

## 2017-10-27 ENCOUNTER — Other Ambulatory Visit: Payer: Self-pay | Admitting: Radiation Oncology

## 2017-10-27 DIAGNOSIS — C154 Malignant neoplasm of middle third of esophagus: Secondary | ICD-10-CM

## 2017-10-27 NOTE — Progress Notes (Signed)
GI Location of Tumor / Histology: Malignant neoplasm of middle third of [place] presented with increasing "burning" epigastric pain over the past month and a half.  This pain is rated as an 8-9/10.  Pain unhelped by recent addition of protonix 40 mg daily.  Also has had black tarry stools once daily over the past 2 weeks or so, dizziness with standing, and generalized fatigue.  PET scheduled for August 19, 9am  CT CAP 10/26/2017:  1. Asymmetric thickening in the mid esophagus. No esophageal obstruction. 2. No paraesophageal adenopathy. 3. No evidence of pulmonary metastasis. 4. Small ground-glass nodule in the LEFT upper lobe. No follow-up recommended.   Biopsies of Esophagus 10/20/2017   Past/Anticipated interventions by surgeon, if any:    Past/Anticipated interventions by medical oncology, if any:  Appointment scheduled with Dr. Benay Spice 10/28/2017 8 am.   Past/Anticipated interventions by GI, if any: PA Ellouise Newer 10/13/2017 1.  Patient will need to be scheduled for EGD.  Recheck of hemoglobin in the office shows one-point increase over the past couple of weeks.  This is reassuring.  Did go ahead and discuss risk, benefits, limitations and alternatives of proceeding with EGD.  Timing will be per Dr. Loletha Carrow.  This will likely need to take place in the hospital in case patient needs hemostasis. 2. Increased Pantoprazole to 40mg  BID. #60 with 3 refills 3. Patient will await further recommendations after discussion with Dr. Loletha Carrow today.  Explained that his hemoglobin will determine when his EGD needs to take place.  He is currently on Plavix after recent stent Weight changes, if any:   Bowel/Bladder complaints, if any: No  Nausea / Vomiting, if any: No  Pain issues, if any:  5/10 pain in his chest/stomach/abdomen/sternum.  Any blood per rectum: Occasional blood noted in his stool, dark in color.  He states that the last couple days have been better.  BP (!)  160/73 (BP Location: Right Arm, Patient Position: Sitting)   Pulse 65   Temp 98.9 F (37.2 C) (Oral)   Resp 18   Ht 5\' 6"  (1.676 m)   Wt 189 lb 12.8 oz (86.1 kg)   SpO2 97%   BMI 30.63 kg/m    Wt Readings from Last 3 Encounters:  10/28/17 189 lb 12.8 oz (86.1 kg)  10/28/17 189 lb 12.8 oz (86.1 kg)  10/22/17 190 lb (86.2 kg)    SAFETY ISSUES:  Prior radiation? Prostate- Dr. Rushie Nyhan lower pelvis 6840 cGy  Pacemaker/ICD? Yes- [address] location Dr. [name]  Possible current pregnancy? No  Is the patient on methotrexate? No  Current Complaints/Details: Stent placed 10/02/2017- aspirin/plavix  Prostate cancer s/p radical retropubic prostatectomy with bilateral pelvic lymph node dissection

## 2017-10-28 ENCOUNTER — Encounter: Payer: Self-pay | Admitting: Radiation Oncology

## 2017-10-28 ENCOUNTER — Ambulatory Visit: Payer: Medicare Other | Admitting: Radiation Oncology

## 2017-10-28 ENCOUNTER — Inpatient Hospital Stay: Payer: Medicare Other | Attending: Oncology | Admitting: Oncology

## 2017-10-28 ENCOUNTER — Ambulatory Visit (HOSPITAL_BASED_OUTPATIENT_CLINIC_OR_DEPARTMENT_OTHER)
Admission: RE | Admit: 2017-10-28 | Discharge: 2017-10-28 | Disposition: A | Discharge status: Home / Self Care | Payer: Medicare Other | Source: Ambulatory Visit | Attending: Oncology | Admitting: Oncology

## 2017-10-28 ENCOUNTER — Ambulatory Visit (HOSPITAL_COMMUNITY): Payer: Medicare Other

## 2017-10-28 ENCOUNTER — Ambulatory Visit
Admission: RE | Admit: 2017-10-28 | Discharge: 2017-10-28 | Disposition: A | Discharge status: Home / Self Care | Payer: Medicare Other | Source: Ambulatory Visit | Attending: Radiation Oncology | Admitting: Radiation Oncology

## 2017-10-28 ENCOUNTER — Other Ambulatory Visit: Payer: Self-pay

## 2017-10-28 ENCOUNTER — Telehealth: Payer: Self-pay | Admitting: Oncology

## 2017-10-28 ENCOUNTER — Telehealth: Payer: Self-pay | Admitting: *Deleted

## 2017-10-28 VITALS — BP 160/73 | HR 65 | Temp 98.9°F | Resp 18 | Ht 66.0 in | Wt 189.8 lb

## 2017-10-28 VITALS — BP 155/72 | HR 64 | Temp 99.1°F | Resp 18 | Ht 66.0 in | Wt 189.8 lb

## 2017-10-28 DIAGNOSIS — C159 Malignant neoplasm of esophagus, unspecified: Secondary | ICD-10-CM | POA: Insufficient documentation

## 2017-10-28 DIAGNOSIS — Z7982 Long term (current) use of aspirin: Secondary | ICD-10-CM | POA: Insufficient documentation

## 2017-10-28 DIAGNOSIS — J449 Chronic obstructive pulmonary disease, unspecified: Secondary | ICD-10-CM

## 2017-10-28 DIAGNOSIS — G4733 Obstructive sleep apnea (adult) (pediatric): Secondary | ICD-10-CM | POA: Insufficient documentation

## 2017-10-28 DIAGNOSIS — I495 Sick sinus syndrome: Secondary | ICD-10-CM | POA: Insufficient documentation

## 2017-10-28 DIAGNOSIS — D696 Thrombocytopenia, unspecified: Secondary | ICD-10-CM

## 2017-10-28 DIAGNOSIS — I350 Nonrheumatic aortic (valve) stenosis: Secondary | ICD-10-CM | POA: Diagnosis not present

## 2017-10-28 DIAGNOSIS — R1310 Dysphagia, unspecified: Secondary | ICD-10-CM

## 2017-10-28 DIAGNOSIS — Z800 Family history of malignant neoplasm of digestive organs: Secondary | ICD-10-CM

## 2017-10-28 DIAGNOSIS — I119 Hypertensive heart disease without heart failure: Secondary | ICD-10-CM | POA: Insufficient documentation

## 2017-10-28 DIAGNOSIS — Z7401 Bed confinement status: Secondary | ICD-10-CM | POA: Diagnosis not present

## 2017-10-28 DIAGNOSIS — Z79899 Other long term (current) drug therapy: Secondary | ICD-10-CM | POA: Insufficient documentation

## 2017-10-28 DIAGNOSIS — I252 Old myocardial infarction: Secondary | ICD-10-CM | POA: Insufficient documentation

## 2017-10-28 DIAGNOSIS — I480 Paroxysmal atrial fibrillation: Secondary | ICD-10-CM | POA: Insufficient documentation

## 2017-10-28 DIAGNOSIS — Z87891 Personal history of nicotine dependence: Secondary | ICD-10-CM

## 2017-10-28 DIAGNOSIS — C154 Malignant neoplasm of middle third of esophagus: Secondary | ICD-10-CM | POA: Insufficient documentation

## 2017-10-28 DIAGNOSIS — I739 Peripheral vascular disease, unspecified: Secondary | ICD-10-CM

## 2017-10-28 DIAGNOSIS — Z8546 Personal history of malignant neoplasm of prostate: Secondary | ICD-10-CM

## 2017-10-28 DIAGNOSIS — Z950 Presence of cardiac pacemaker: Secondary | ICD-10-CM | POA: Insufficient documentation

## 2017-10-28 DIAGNOSIS — Z510 Encounter for antineoplastic radiation therapy: Secondary | ICD-10-CM | POA: Insufficient documentation

## 2017-10-28 DIAGNOSIS — I2510 Atherosclerotic heart disease of native coronary artery without angina pectoris: Secondary | ICD-10-CM | POA: Insufficient documentation

## 2017-10-28 DIAGNOSIS — Z951 Presence of aortocoronary bypass graft: Secondary | ICD-10-CM | POA: Insufficient documentation

## 2017-10-28 DIAGNOSIS — Z7902 Long term (current) use of antithrombotics/antiplatelets: Secondary | ICD-10-CM | POA: Insufficient documentation

## 2017-10-28 DIAGNOSIS — Z5111 Encounter for antineoplastic chemotherapy: Secondary | ICD-10-CM | POA: Insufficient documentation

## 2017-10-28 NOTE — Progress Notes (Signed)
Preliminary notes--Right lower extremity venous duplex exam completed. Negative for DVT. Incidental finding: 1.43x1.72x1.10 cm complex fluid collection seen at the popliteal fossa medially.    Result called Dr. Gearldine Shown office spoke with RN Melia, patient was instructed to go home.   Hongying Lorelei Heikkila (RDMS RVT) 10/28/17 1:04 PM

## 2017-10-28 NOTE — Progress Notes (Signed)
START ON PATHWAY REGIMEN - Gastroesophageal     Administer weekly during RT:     Paclitaxel      Carboplatin   **Always confirm dose/schedule in your pharmacy ordering system**  Patient Characteristics: Esophageal & GE Junction, Squamous Cell, Preoperative or Nonsurgical Candidate (Clinical Staging), cT3 or Higher or cN+, Unresectable/Nonsurgical Candidate (Any cT) Histology: Squamous Cell Disease Classification: Esophageal Therapeutic Status: Preoperative or Nonsurgical Candidate (Clinical Staging) AJCC M Category: Staged < 8th Ed. AJCC 8 Stage Grouping: Staged < 8th Ed. AJCC Grade: Staged < 8th Ed. AJCC Location: Staged < 8th Ed. AJCC T Category: Staged < 8th Ed. AJCC N Category: Staged < 8th Ed. Intent of Therapy: Curative Intent, Discussed with Patient

## 2017-10-28 NOTE — Progress Notes (Signed)
Radiation Oncology         [phone] ________________________________  Name: [name]        MRN: [id number]  Date of Service: 10/28/2017 DOB: 11/16/1934  FG:[id number], [name], MD  Ladell Pier, MD     REFERRING PHYSICIAN: Ladell Pier, MD   DIAGNOSIS: The encounter diagnosis was Malignant neoplasm of middle third of esophagus ([place]).   HISTORY OF PRESENT ILLNESS: [name] is a 83 y.o. male seen at the request of Dr. Benay Spice for a new diagnosis of esophageal cancer. The patient noticed about 10 pounds of weight loss and discomfort in the mid epigastric region. He was anemic with heme positive stool and an endoscopy on 10/20/17 revealed a partially circumferential lesion partially obstructing in the middle third esophagus. Dr. Loletha Carrow discussed these findings with the patient. He underwent a CT C/A/P on 10/26/17 that revealed asymmetric thickening in the mid esophagus and no paraesophageal adenopathy or metastatic disease was seen. He is not a surgical candidate, and comes today to discuss options of treatment. He is scheduled for PET imaging on 11/02/17. He comes today to discuss options of chemoRT.  PREVIOUS RADIATION THERAPY: No   PAST MEDICAL HISTORY:  Past Medical History:  Diagnosis Date  . AAA (abdominal aortic aneurysm) ([place])    a. Last duplex - 3.8cm 01/2013 - due 01/2014.  [name] Acne rosacea   . Alcoholic hepatitis with ascites 08/28/2017  . Anginal pain ([place]) 1996  . Aortic stenosis, mild    Last echo 05/04/12 +LVH  . Arthritis    "shoulders" (09/01/2017)  . Back pain    hx epidural injections  . Baker's cyst    Lt.  [name] CAD (coronary artery disease)    a. s/p MI/PTCA - Cx 1994. b. s/p CABG x5 in 1997. c. Abnl nuc 2003- occ SVG-RCA, patent seq SVG-OM1-dCxOM, patentl LIMA-LAD, patent, SVG-diag. d. Normal nuc 09/2012.  . Carotid artery disease ([place])    a. Duplex 01/2013: mildly abnormal. Mild plaque without significant diameter reduction. Repeat recommended  11/15.  [name] COPD (chronic obstructive pulmonary disease) ([place])   . Dizziness and giddiness 08/04/2013  . GERD (gastroesophageal reflux disease)   . H/O: GI bleed    mild, neg. colonoscopy  . Heart murmur   . HOH (hard of hearing)   . HTN (hypertension)   . Hyperlipidemia   . Hypertensive heart disease   . Myocardial infarction ([place]) 1994  . OSA on CPAP   . OSA treated with BiPAP 11/20/2015   Severe with [facility] 38/hr  . PAF (paroxysmal atrial fibrillation) ([place])    a. Remote per records.   . Pneumonia    history  . Presence of permanent cardiac pacemaker    DOI 1999 with change out 2006; St Jude  . Prostate cancer ([place]) ~ 2010   a. s/p radical retropubic prostatectomy with bilateral pelvic lymph node dissection  . PVD (peripheral vascular disease) ([place])    a. Rt ext iliac stenosis, last PV cath 2003, moderate stenosis last Lower Ext Dopplers 12/16/11 - Rt. ABI 0.96  Lt ABI 1.0.  . Sick sinus syndrome ([place])    a. placed 1999, gen change 2011 - St. Jude.  . Sinus node dysfunction ([place]) 01/20/2013  . Syncope 11/28/97  . Thrombocytopenia ([facility])    chronic, mild  . Valvular heart disease    a. Echo 04/2012: mild-mod AS, mild AI, mild MR.       PAST SURGICAL HISTORY: Past Surgical History:  Procedure Laterality  Date  . APPENDECTOMY    . BIOPSY  10/20/2017   Procedure: BIOPSY;  Surgeon: Doran Stabler, MD;  Location: [facility];  Service: Gastroenterology;;  . CARDIAC CATHETERIZATION  2003   VG to RCA occluded, other grafts patent  . CARDIAC CATHETERIZATION N/A 05/04/2015   Procedure: Right/Left Heart Cath and Coronary/Graft Angiography;  Surgeon: Sherren Mocha, MD;  Location: [place] [facility];  Service: Cardiovascular;  Laterality: N/A;  . CARDIAC CATHETERIZATION  09/01/2017  . CARDIAC VALVE REPLACEMENT    . CATARACT EXTRACTION W/ INTRAOCULAR LENS  IMPLANT, BILATERAL Bilateral   . COLONOSCOPY    . CORONARY ANGIOPLASTY  03/29/92   PTCA to LCX  . CORONARY ARTERY BYPASS GRAFT  1997     LIMA-LAD; VG-diag; seq VG-1st OM & distal LCX; VG-RCA  . CORONARY ATHERECTOMY N/A 09/01/2017   Procedure: CORONARY ATHERECTOMY;  Surgeon: Jettie Booze, MD;  Location: [place] [facility];  Service: Cardiovascular;  Laterality: N/A;  . CORONARY ATHERECTOMY  10/01/2017  . CORONARY ATHERECTOMY N/A 10/01/2017   Procedure: CORONARY ATHERECTOMY;  Surgeon: Leonie Man, MD;  Location: [place] [facility];  Service: Cardiovascular;  Laterality: N/A;  . ESOPHAGOGASTRODUODENOSCOPY (EGD) WITH PROPOFOL N/A 10/20/2017   Procedure: ESOPHAGOGASTRODUODENOSCOPY (EGD) WITH PROPOFOL;  Surgeon: Doran Stabler, MD;  Location[facility];  Service: Gastroenterology;  Laterality: N/A;  . INSERT / REPLACE / REMOVE PACEMAKER  11/28/1997   pacesetter--ERI 2011  . LEFT HEART CATH N/A 09/01/2017   Procedure: Left Heart Cath;  Surgeon: Jettie Booze, MD;  Location: [place] [facility];  Service: Cardiovascular;  Laterality: N/A;  . LEFT HEART CATH AND CORS/GRAFTS ANGIOGRAPHY N/A 08/28/2017   Procedure: LEFT HEART CATH AND CORS/GRAFTS ANGIOGRAPHY;  Surgeon: Leonie Man, MD;  Location: [place] [facility];  Service: Cardiovascular;  Laterality: N/A;  . PACEMAKER GENERATOR CHANGE  08/15/2009   St. Jude accent  . SUPRAPUBIC PROSTATECTOMY    . TEE WITHOUT CARDIOVERSION N/A 06/12/2015   Procedure: TRANSESOPHAGEAL ECHOCARDIOGRAM (TEE);  Surgeon: Sherren Mocha, MD;  Location: [place];  Service: Open Heart Surgery;  Laterality: N/A;  . TONSILLECTOMY    . TRANSCATHETER AORTIC VALVE REPLACEMENT, TRANSFEMORAL N/A 06/12/2015   Procedure: TRANSCATHETER AORTIC VALVE REPLACEMENT, TRANSFEMORAL, possible transpical;  Surgeon: Sherren Mocha, MD;  Location: [place];  Service: Open Heart Surgery;  Laterality: N/A;     FAMILY HISTORY:  Family History  Problem Relation Age of Onset  . Colon cancer Father   . Cancer Brother   . Heart Problems Brother   . Heart Problems Sister   . CAD Unknown      SOCIAL HISTORY:   reports that he quit smoking about 22 years ago. His smoking use included cigarettes. He has a 96.00 pack-year smoking history. He has never used smokeless tobacco. He reports that he does not drink alcohol or use drugs. The patient is widowed and lives in [place]. He's accompanied by his daughter and daughter in law. He is a retired Chief Strategy Officer, and used to build houses.   ALLERGIES: Patient has no known allergies.   MEDICATIONS:  Current Outpatient Medications  Medication Sig Dispense Refill  . acetaminophen (TYLENOL) 325 MG tablet Take 650 mg by mouth 2 (two) times daily.    [name] aspirin EC 81 MG EC tablet Take 1 tablet (81 mg total) by mouth daily. 30 tablet 0  . atorvastatin (LIPITOR) 40 MG tablet Take 1 tablet (40 mg total) by mouth daily at 6 PM. 30 tablet  0  . clopidogrel (PLAVIX) 75 MG tablet Take 1 tablet (75 mg total) by mouth daily. 30 tablet 0  . ferrous sulfate 325 (65 FE) MG EC tablet Take 325 mg by mouth daily.    . metoprolol tartrate (LOPRESSOR) 25 MG tablet TAKE 1 TABLET BY MOUTH TWICE DAILY 180 tablet 3  . nitroGLYCERIN (NITROSTAT) 0.4 MG SL tablet Place 1 tablet (0.4 mg total) under the tongue every 5 (five) minutes as needed for chest pain. 25 tablet 2  . Omega-3 Fatty Acids (FISH OIL) 1200 MG CAPS Take 1,200 mg by mouth every evening.    . pantoprazole (PROTONIX) 40 MG tablet Take 1 tablet (40 mg total) by mouth daily. 30 tablet 11  . triamterene-hydrochlorothiazide (MAXZIDE-25) 37.5-25 MG tablet TAKE 1 TABLET BY MOUTH DAILY MONDAY THROUGH FRIDAY (Patient taking differently: Take 1 tablet by mouth every Monday, Tuesday, Wednesday, Thursday, and Friday. ) 90 tablet 2  . vitamin C (ASCORBIC ACID) 500 MG tablet Take 500 mg by mouth every evening.     No current facility-administered medications for this encounter.      REVIEW OF SYSTEMS: On review of systems, the patient reports that he is doing well overall. He is able to eat most anything he wants but has some pain after  swallowing and with eating. He denies any chest pain, shortness of breath, cough, fevers, chills, night sweats, unintended weight changes. He denies any bowel or bladder disturbances, and denies abdominal pain, nausea or vomiting. He has had right calf pain for several days without edema. Otherwise he denies any new musculoskeletal or joint aches or pains. A complete review of systems is obtained and is otherwise negative.     PHYSICAL EXAM:  Wt Readings from Last 3 Encounters:  10/28/17 189 lb 12.8 oz (86.1 kg)  10/28/17 189 lb 12.8 oz (86.1 kg)  10/22/17 190 lb (86.2 kg)   Temp Readings from Last 3 Encounters:  10/28/17 98.9 F (37.2 C) (Oral)  10/28/17 99.1 F (37.3 C) (Oral)  10/20/17 98.6 F (37 C) (Oral)   BP Readings from Last 3 Encounters:  10/28/17 (!) 160/73  10/28/17 (!) 155/72  10/22/17 126/74   Pulse Readings from Last 3 Encounters:  10/28/17 65  10/28/17 64  10/22/17 64   Pain Assessment Pain Score: 5  Pain Frequency: Several days a week Pain Loc: Abdomen(also having pain in his right leg)/10  In general this is a well appearing Caucasian male in no acute distress. He is alert and oriented x4 and appropriate throughout the examination. HEENT reveals that the patient is normocephalic, atraumatic. EOMs are intact. PERRLA. Skin is intact without any evidence of gross lesions. Cardiovascular exam reveals a regular rate and rhythm, no clicks rubs or murmurs are auscultated. Chest is clear to auscultation bilaterally. Lymphatic assessment is performed and does not reveal any adenopathy in the cervical, supraclavicular, axillary, or inguinal chains. Abdomen has active bowel sounds in all quadrants and is intact. The abdomen is soft, non tender, non distended. Lower extremities are negative for pretibial pitting edema, cyanosis or clubbing.He does have right lower extremity deep calf tenderness. No edema though is noted.   ECOG = 1  0 - Asymptomatic (Fully active, able to  carry on all predisease activities without restriction)  1 - Symptomatic but completely ambulatory (Restricted in physically strenuous activity but ambulatory and able to carry out work of a light or sedentary nature. For example, light housework, office work)  2 - Symptomatic, <50% in bed during  the day (Ambulatory and capable of all self care but unable to carry out any work activities. Up and about more than 50% of waking hours)  3 - Symptomatic, >50% in bed, but not bedbound (Capable of only limited self-care, confined to bed or chair 50% or more of waking hours)  4 - Bedbound (Completely disabled. Cannot carry on any self-care. Totally confined to bed or chair)  5 - Death   Eustace Pen MM, Creech RH, Tormey DC, et al. [phone]). "Toxicity and response criteria of the [facility]". [place] Oncol. 5 (6): 649-55    LABORATORY DATA:  Lab Results  Component Value Date   WBC 3.4 (L) 10/23/2017   HGB 11.7 (L) 10/23/2017   HCT 34.2 (L) 10/23/2017   MCV 95.3 10/23/2017   PLT 107.0 (L) 10/23/2017   Lab Results  Component Value Date   NA 139 10/02/2017   K 4.0 10/02/2017   CL 105 10/02/2017   CO2 27 10/02/2017   Lab Results  Component Value Date   ALT 29 08/25/2017   AST 44 (H) 08/25/2017   ALKPHOS 69 08/25/2017   BILITOT 1.1 08/25/2017      RADIOGRAPHY: Ct Chest W Contrast  Result Date: 10/26/2017 CLINICAL DATA:  Newly diagnosed esophageal carcinoma. Lower chest pain. EXAM: CT CHEST, ABDOMEN, AND PELVIS WITH CONTRAST TECHNIQUE: Multidetector CT imaging of the chest, abdomen and pelvis was performed following the standard protocol during bolus administration of intravenous contrast. CONTRAST:  170mL OMNIPAQUE IOHEXOL 300 MG/ML  SOLN COMPARISON:  None. FINDINGS: CT CHEST FINDINGS Cardiovascular: Port in the anterior chest wall with tip in distal SVC. Post CABG anatomy. Aortic valve replacement. Mediastinum/Nodes: No axillary supraclavicular adenopathy. No  mediastinal hilar adenopathy. No pericardial effusion. Asymmetric thickening in the midesophagus several cm below the carina to 13 mm (image 34/2). No esophageal obstruction. No paraesophageal adenopathy. Lungs/Pleura: Ground-glass nodule in the LEFT upper lobe measures 5 mm (image 66/7). Several small calcified nodules are present. Musculoskeletal: No aggressive osseous lesion. CT ABDOMEN AND PELVIS FINDINGS Hepatobiliary: Liver has a nodular contour. No enhancing lesion. Gallbladder normal. Pancreas: Pancreas is normal. No ductal dilatation. No pancreatic inflammation. Spleen: Normal spleen Adrenals/urinary tract: Adrenal glands and kidneys are normal. The ureters and bladder normal. Stomach/Bowel: Stomach, small bowel, appendix, and cecum are normal. The colon and rectosigmoid colon are normal. Vascular/Lymphatic: Intimal calcification abdominal aorta. No gastrohepatic ligament adenopathy. No periportal adenopathy. Infrarenal abdominal aortic aneurysm to 40 mm. Reproductive: Prostatectomy Other: No free fluid. Musculoskeletal: No aggressive osseous lesion. IMPRESSION: Chest Impression: 1. Asymmetric thickening in the mid esophagus. No esophageal obstruction. 2. No paraesophageal adenopathy. 3. No evidence of pulmonary metastasis. 4. Small ground-glass nodule in the LEFT upper lobe. No follow-up recommended. This recommendation follows the consensus statement: Guidelines for Management of Incidental Pulmonary Nodules Detected on CT Images: From the Fleischner Society 2017; Radiology 2017; 284:228-243. Abdomen / Pelvis Impression: 1. No upper abdominal lymphadenopathy. 2. No liver metastasis. 3. Nodule liver consistent with cirrhosis. 4.  Aortic Atherosclerosis (ICD10-I70.0). Electronically Signed   By: Suzy Bouchard M.D.   On: 10/26/2017 16:40   Ct Abdomen Pelvis W Contrast  Result Date: 10/26/2017 CLINICAL DATA:  Newly diagnosed esophageal carcinoma. Lower chest pain. EXAM: CT CHEST, ABDOMEN, AND PELVIS  WITH CONTRAST TECHNIQUE: Multidetector CT imaging of the chest, abdomen and pelvis was performed following the standard protocol during bolus administration of intravenous contrast. CONTRAST:  127mL OMNIPAQUE IOHEXOL 300 MG/ML  SOLN COMPARISON:  None. FINDINGS: CT CHEST FINDINGS Cardiovascular:  Port in the anterior chest wall with tip in distal SVC. Post CABG anatomy. Aortic valve replacement. Mediastinum/Nodes: No axillary supraclavicular adenopathy. No mediastinal hilar adenopathy. No pericardial effusion. Asymmetric thickening in the midesophagus several cm below the carina to 13 mm (image 34/2). No esophageal obstruction. No paraesophageal adenopathy. Lungs/Pleura: Ground-glass nodule in the LEFT upper lobe measures 5 mm (image 66/7). Several small calcified nodules are present. Musculoskeletal: No aggressive osseous lesion. CT ABDOMEN AND PELVIS FINDINGS Hepatobiliary: Liver has a nodular contour. No enhancing lesion. Gallbladder normal. Pancreas: Pancreas is normal. No ductal dilatation. No pancreatic inflammation. Spleen: Normal spleen Adrenals/urinary tract: Adrenal glands and kidneys are normal. The ureters and bladder normal. Stomach/Bowel: Stomach, small bowel, appendix, and cecum are normal. The colon and rectosigmoid colon are normal. Vascular/Lymphatic: Intimal calcification abdominal aorta. No gastrohepatic ligament adenopathy. No periportal adenopathy. Infrarenal abdominal aortic aneurysm to 40 mm. Reproductive: Prostatectomy Other: No free fluid. Musculoskeletal: No aggressive osseous lesion. IMPRESSION: Chest Impression: 1. Asymmetric thickening in the mid esophagus. No esophageal obstruction. 2. No paraesophageal adenopathy. 3. No evidence of pulmonary metastasis. 4. Small ground-glass nodule in the LEFT upper lobe. No follow-up recommended. This recommendation follows the consensus statement: Guidelines for Management of Incidental Pulmonary Nodules Detected on CT Images: From the Fleischner  Society 2017; Radiology 2017; 284:228-243. Abdomen / Pelvis Impression: 1. No upper abdominal lymphadenopathy. 2. No liver metastasis. 3. Nodule liver consistent with cirrhosis. 4.  Aortic Atherosclerosis (ICD10-I70.0). Electronically Signed   By: Suzy Bouchard M.D.   On: 10/26/2017 16:40       IMPRESSION/PLAN: 1. 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 Dr. Lisbeth Renshaw discusses the pathology findings and reviews the nature of esophageal cancer and for patients who are not surgical candidates, or who have locally advanced disease, chemoRT would be offered. This appears to be the appropriate form of therapy for him. We will confirm his staging with PET imaging which we will also use to fuse into his treatment plan to adequately cover his tumor with radiation. We discussed the risks, benefits, short, and long term effects of radiotherapy, and the patient is interested in proceeding. Dr. Lisbeth Renshaw discusses the delivery and logistics of radiotherapy and anticipates a course of 5 1/2 weeks of radiotherapy. He is scheduled for PET on 11/02/17, and for simulation on 11/03/17. We anticipate starting treatment on 11/09/17. 2. Right calf pain. The patient will go to radiology following this appointment for a doppler ultrasound to rule out DVT.  The above documentation reflects my direct findings during this shared patient visit. Please see the separate note by Dr. Lisbeth Renshaw on this date for the remainder of the patient's plan of care.    Carola Rhine, PAC

## 2017-10-28 NOTE — Telephone Encounter (Signed)
Gave patient avs and calendar of upcoming appts.  °

## 2017-10-28 NOTE — Progress Notes (Signed)
Met with [name] and family. Explained role of nurse navigator. Referral made to dietician for diet education.   Contact names and phone numbers were provided for entire [facility] team. Informed patient of available support groups ( will need to introduce again at a later date)   No barriers to care identified at present time.  Will continue to follow as needed

## 2017-10-28 NOTE — Telephone Encounter (Signed)
Called patient to inform of Pet Scan for 11-02-17 - arrival time - 9 am @ [facility], pt. to be NPO- 6 hrs. Prior to test, lvm for a return call

## 2017-10-28 NOTE — Progress Notes (Signed)
[name] Consult   Requesting MD: Pranay Hilbun 83 y.o.  [birth year]    Reason for Consult: Esophagus cancer   HPI: [name] reports a one-month history of subxiphoid discomfort, solid dysphagia and odynophagia.  He noted "dark blood "per rectum.  He was referred to Dr. Loletha Carrow and was taken to an upper endoscopy on 10/20/2017.  A partially obstructing mass was found in the middle third of the esophagus.  The stomach appeared normal.  A biopsy confirmed squamous cell carcinoma. CTs of the chest, abdomen, and pelvis on 10/26/2017 revealed no adenopathy in the chest.  Asymmetric thickening in the mid esophagus.  No paraesophageal adenopathy.  A groundglass nodule measured 5 mm in the left upper lobe.  Nodular liver.  Prostatectomy. He is referred for medical oncology and radiology oncology consultation. Past Medical History:  Diagnosis Date  . AAA (abdominal aortic aneurysm) ([place])    a. Last duplex - 3.8cm 01/2013 - due 01/2014.  [name] Acne rosacea   . Alcoholic hepatitis with ascites 08/28/2017  . Anginal pain ([place]) 1996  . Aortic stenosis, mild    Last echo 05/04/12 +LVH  . Arthritis    "shoulders" (09/01/2017)  . Back pain    hx epidural injections  . Baker's cyst    Lt.  [name] CAD (coronary artery disease)    a. s/p MI/PTCA - Cx 1994. b. s/p CABG x5 in 1997. c. Abnl nuc 2003- occ SVG-RCA, patent seq SVG-OM1-dCxOM, patentl LIMA-LAD, patent, SVG-diag. d. Normal nuc 09/2012.  . Carotid artery disease ([place])    a. Duplex 01/2013: mildly abnormal. Mild plaque without significant diameter reduction. Repeat recommended 11/15.  [name] COPD (chronic obstructive pulmonary disease) ([place])   . Dizziness and giddiness 08/04/2013  . GERD (gastroesophageal reflux disease)   . H/O: GI bleed    mild, neg. colonoscopy  . Heart murmur   . HOH (hard of hearing)   . HTN (hypertension)   . Hyperlipidemia   . Hypertensive heart disease   . Myocardial infarction ([place])  1994  . OSA on CPAP   . OSA treated with BiPAP 11/20/2015   Severe with [facility] 38/hr  . PAF (paroxysmal atrial fibrillation) ([place])    a. Remote per records.   . Pneumonia    history  . Presence of permanent cardiac pacemaker    DOI 1999 with change out 2006; St Jude  . Prostate cancer ([place]) ~ 2010   a. s/p radical retropubic prostatectomy with bilateral pelvic lymph node dissection  . PVD (peripheral vascular disease) ([place])    a. Rt ext iliac stenosis, last PV cath 2003, moderate stenosis last Lower Ext Dopplers 12/16/11 - Rt. ABI 0.96  Lt ABI 1.0.  . Sick sinus syndrome ([place])    a. placed 1999, gen change 2011 - St. Jude.  . Sinus node dysfunction ([place]) 01/20/2013  . Syncope 11/28/97  . Thrombocytopenia ([facility])    chronic, mild  . Valvular heart disease    a. Echo 04/2012: mild-mod AS, mild AI, mild MR.    Past Surgical History:  Procedure Laterality Date  . APPENDECTOMY    . BIOPSY  10/20/2017   Procedure: BIOPSY;  Surgeon: Doran Stabler, MD;  Location: [facility];  Service: Gastroenterology;;  . CARDIAC CATHETERIZATION  2003   VG to RCA occluded, other grafts patent  . CARDIAC CATHETERIZATION N/A 05/04/2015   Procedure: Right/Left Heart Cath and Coronary/Graft Angiography;  Surgeon: Sherren Mocha, MD;  Location: [facility]  INVASIVE CV LAB;  Service: Cardiovascular;  Laterality: N/A;  . CARDIAC CATHETERIZATION  09/01/2017  . CARDIAC VALVE REPLACEMENT    . CATARACT EXTRACTION W/ INTRAOCULAR LENS  IMPLANT, BILATERAL Bilateral   . COLONOSCOPY    . CORONARY ANGIOPLASTY  03/29/92   PTCA to LCX  . CORONARY ARTERY BYPASS GRAFT  1997   LIMA-LAD; VG-diag; seq VG-1st OM & distal LCX; VG-RCA  . CORONARY ATHERECTOMY N/A 09/01/2017   Procedure: CORONARY ATHERECTOMY;  Surgeon: Jettie Booze, MD;  Location: [facility] CV LAB;  Service: Cardiovascular;  Laterality: N/A;  . CORONARY ATHERECTOMY  10/01/2017  . CORONARY ATHERECTOMY N/A 10/01/2017   Procedure: CORONARY ATHERECTOMY;  Surgeon:  Leonie Man, MD;  Location: [facility] CV LAB;  Service: Cardiovascular;  Laterality: N/A;  . ESOPHAGOGASTRODUODENOSCOPY (EGD) WITH PROPOFOL N/A 10/20/2017   Procedure: ESOPHAGOGASTRODUODENOSCOPY (EGD) WITH PROPOFOL;  Surgeon: Doran Stabler, MD;  Location: [facility];  Service: Gastroenterology;  Laterality: N/A;  . INSERT / REPLACE / REMOVE PACEMAKER  11/28/1997   pacesetter--ERI 2011  . LEFT HEART CATH N/A 09/01/2017   Procedure: Left Heart Cath;  Surgeon: Jettie Booze, MD;  Location: [facility] CV LAB;  Service: Cardiovascular;  Laterality: N/A;  . LEFT HEART CATH AND CORS/GRAFTS ANGIOGRAPHY N/A 08/28/2017   Procedure: LEFT HEART CATH AND CORS/GRAFTS ANGIOGRAPHY;  Surgeon: Leonie Man, MD;  Location: [facility] CV LAB;  Service: Cardiovascular;  Laterality: N/A;  . PACEMAKER GENERATOR CHANGE  08/15/2009   St. Jude accent  . SUPRAPUBIC PROSTATECTOMY    . TEE WITHOUT CARDIOVERSION N/A 06/12/2015   Procedure: TRANSESOPHAGEAL ECHOCARDIOGRAM (TEE);  Surgeon: Sherren Mocha, MD;  Location: [facility];  Service: Open Heart Surgery;  Laterality: N/A;  . TONSILLECTOMY    . TRANSCATHETER AORTIC VALVE REPLACEMENT, TRANSFEMORAL N/A 06/12/2015   Procedure: TRANSCATHETER AORTIC VALVE REPLACEMENT, TRANSFEMORAL, possible transpical;  Surgeon: Sherren Mocha, MD;  Location: [facility];  Service: Open Heart Surgery;  Laterality: N/A;    Medications: Reviewed  Allergies: No Known Allergies  Family history: A paternal uncle had colon cancer.  His father died of colon cancer.  Social History:   He lives alone in [place].  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  He quit smoking cigarettes in 1997.  He quit drinking alcohol 2 years ago.  ROS:   Positives include: Solid dysphagia and odynophagia, subxiphoid pain, dark blood in the stool, exertional dyspnea-improved following placement of a coronary stent, right leg pain beginning earlier this week-pain begins at the right iliac and  extends into the right lower leg.  The leg pain is improving  A complete ROS was otherwise negative.  Physical Exam:  Blood pressure (!) 155/72, pulse 64, temperature 99.1 F (37.3 C), temperature source Oral, resp. rate 18, height 5\' 6"  (1.676 m), weight 189 lb 12.8 oz (86.1 kg), SpO2 94 %.  HEENT: Oropharynx without visible mass, edentulous, neck without mass Lungs: Clear bilaterally Cardiac: Regular rate and rhythm Abdomen: No mass, nontender, no hepatosplenomegaly GU: Testes without mass Vascular: No leg edema, right leg without erythema.  Mild tenderness of the right thigh and calf Lymph nodes: No cervical, supraclavicular, axillary, or inguinal nodes Neurologic: Alert and oriented, the motor exam appears intact in the upper and lower extremities Skin: No rash Musculoskeletal: No spine tenderness, no pain with motion of the right hip   LAB:  CBC  Lab Results  Component Value Date   WBC 3.4 (L) 10/23/2017   HGB 11.7 (L)  10/23/2017   HCT 34.2 (L) 10/23/2017   MCV 95.3 10/23/2017   PLT 107.0 (L) 10/23/2017   NEUTROABS 1.7 10/23/2017        CMP  Lab Results  Component Value Date   NA 139 10/02/2017   K 4.0 10/02/2017   CL 105 10/02/2017   CO2 27 10/02/2017   GLUCOSE 100 (H) 10/02/2017   BUN 17 10/02/2017   CREATININE 0.94 10/02/2017   CALCIUM 8.9 10/02/2017   PROT 7.3 08/25/2017   ALBUMIN 3.8 08/25/2017   AST 44 (H) 08/25/2017   ALT 29 08/25/2017   ALKPHOS 69 08/25/2017   BILITOT 1.1 08/25/2017   GFRNONAA >60 10/02/2017   GFRAA >60 10/02/2017      Imaging:  Ct Chest W Contrast  Result Date: 10/26/2017 CLINICAL DATA:  Newly diagnosed esophageal carcinoma. Lower chest pain. EXAM: CT CHEST, ABDOMEN, AND PELVIS WITH CONTRAST TECHNIQUE: Multidetector CT imaging of the chest, abdomen and pelvis was performed following the standard protocol during bolus administration of intravenous contrast. CONTRAST:  134mL OMNIPAQUE IOHEXOL 300 MG/ML  SOLN COMPARISON:   None. FINDINGS: CT CHEST FINDINGS Cardiovascular: Port in the anterior chest wall with tip in distal SVC. Post CABG anatomy. Aortic valve replacement. Mediastinum/Nodes: No axillary supraclavicular adenopathy. No mediastinal hilar adenopathy. No pericardial effusion. Asymmetric thickening in the midesophagus several cm below the carina to 13 mm (image 34/2). No esophageal obstruction. No paraesophageal adenopathy. Lungs/Pleura: Ground-glass nodule in the LEFT upper lobe measures 5 mm (image 66/7). Several small calcified nodules are present. Musculoskeletal: No aggressive osseous lesion. CT ABDOMEN AND PELVIS FINDINGS Hepatobiliary: Liver has a nodular contour. No enhancing lesion. Gallbladder normal. Pancreas: Pancreas is normal. No ductal dilatation. No pancreatic inflammation. Spleen: Normal spleen Adrenals/urinary tract: Adrenal glands and kidneys are normal. The ureters and bladder normal. Stomach/Bowel: Stomach, small bowel, appendix, and cecum are normal. The colon and rectosigmoid colon are normal. Vascular/Lymphatic: Intimal calcification abdominal aorta. No gastrohepatic ligament adenopathy. No periportal adenopathy. Infrarenal abdominal aortic aneurysm to 40 mm. Reproductive: Prostatectomy Other: No free fluid. Musculoskeletal: No aggressive osseous lesion. IMPRESSION: Chest Impression: 1. Asymmetric thickening in the mid esophagus. No esophageal obstruction. 2. No paraesophageal adenopathy. 3. No evidence of pulmonary metastasis. 4. Small ground-glass nodule in the LEFT upper lobe. No follow-up recommended. This recommendation follows the consensus statement: Guidelines for Management of Incidental Pulmonary Nodules Detected on CT Images: From the Fleischner Society 2017; Radiology 2017; 284:228-243. Abdomen / Pelvis Impression: 1. No upper abdominal lymphadenopathy. 2. No liver metastasis. 3. Nodule liver consistent with cirrhosis. 4.  Aortic Atherosclerosis (ICD10-I70.0). Electronically Signed   By:  Suzy Bouchard M.D.   On: 10/26/2017 16:40   Ct Abdomen Pelvis W Contrast  Result Date: 10/26/2017 CLINICAL DATA:  Newly diagnosed esophageal carcinoma. Lower chest pain. EXAM: CT CHEST, ABDOMEN, AND PELVIS WITH CONTRAST TECHNIQUE: Multidetector CT imaging of the chest, abdomen and pelvis was performed following the standard protocol during bolus administration of intravenous contrast. CONTRAST:  138mL OMNIPAQUE IOHEXOL 300 MG/ML  SOLN COMPARISON:  None. FINDINGS: CT CHEST FINDINGS Cardiovascular: Port in the anterior chest wall with tip in distal SVC. Post CABG anatomy. Aortic valve replacement. Mediastinum/Nodes: No axillary supraclavicular adenopathy. No mediastinal hilar adenopathy. No pericardial effusion. Asymmetric thickening in the midesophagus several cm below the carina to 13 mm (image 34/2). No esophageal obstruction. No paraesophageal adenopathy. Lungs/Pleura: Ground-glass nodule in the LEFT upper lobe measures 5 mm (image 66/7). Several small calcified nodules are present. Musculoskeletal: No aggressive osseous lesion. CT ABDOMEN AND PELVIS  FINDINGS Hepatobiliary: Liver has a nodular contour. No enhancing lesion. Gallbladder normal. Pancreas: Pancreas is normal. No ductal dilatation. No pancreatic inflammation. Spleen: Normal spleen Adrenals/urinary tract: Adrenal glands and kidneys are normal. The ureters and bladder normal. Stomach/Bowel: Stomach, small bowel, appendix, and cecum are normal. The colon and rectosigmoid colon are normal. Vascular/Lymphatic: Intimal calcification abdominal aorta. No gastrohepatic ligament adenopathy. No periportal adenopathy. Infrarenal abdominal aortic aneurysm to 40 mm. Reproductive: Prostatectomy Other: No free fluid. Musculoskeletal: No aggressive osseous lesion. IMPRESSION: Chest Impression: 1. Asymmetric thickening in the mid esophagus. No esophageal obstruction. 2. No paraesophageal adenopathy. 3. No evidence of pulmonary metastasis. 4. Small ground-glass  nodule in the LEFT upper lobe. No follow-up recommended. This recommendation follows the consensus statement: Guidelines for Management of Incidental Pulmonary Nodules Detected on CT Images: From the Fleischner Society 2017; Radiology 2017; 284:228-243. Abdomen / Pelvis Impression: 1. No upper abdominal lymphadenopathy. 2. No liver metastasis. 3. Nodule liver consistent with cirrhosis. 4.  Aortic Atherosclerosis (ICD10-I70.0). Electronically Signed   By: Suzy Bouchard M.D.   On: 10/26/2017 16:40      Assessment/Plan:   1. 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 Solid dysphagia and odynophagia secondary to #1 3. Coronary artery disease 4. History of prostate cancer 5. COPD 6. Permanent cardiac pacemaker 7. Peripheral vascular disease 8. Chronic thrombocytopenia   Disposition:   [name] has been diagnosed with squamous cell carcinoma of the esophagus.  He appears to have disease localized to the esophagus.  He is scheduled for a staging PET scan on 11/02/2017. I discussed the treatment of esophagus cancer with Mr. Andujo and his family.  He does not appear to be a candidate for an esophagectomy procedure given his age and multiple comorbid conditions.  I recommend treatment with chemotherapy and radiation.  He understands there is a significant chance of clinical improvement in remission with chemotherapy/radiation and a small chance of cure.  He is scheduled to see Dr. Lisbeth Renshaw later today.  The plan is to initiate concurrent Taxol/carboplatin chemotherapy and radiation on 11/09/2017.  I reviewed potential toxicities associated with the Taxol/carboplatin regimen including the chance for nausea/vomiting, mucositis, alopecia, and hematologic toxicity.  We discussed the  neuropathy and bone pain seen with Taxol.  We discussed the potential for an allergic reaction with Taxol and carboplatin.  He agrees to proceed.  He will attend a chemotherapy teaching class.  He will be scheduled to meet with the Cancer center nutritionist.  Mr. Dupler will be scheduled for cycle 1 chemotherapy 11/10/2017.  He will return for an office visit and cycle 2 on 11/17/2017.  A chemotherapy plan was entered today.  Betsy Coder, MD  10/28/2017, 8:16 AM

## 2017-10-29 ENCOUNTER — Inpatient Hospital Stay: Payer: Medicare Other

## 2017-10-29 ENCOUNTER — Other Ambulatory Visit: Payer: Self-pay

## 2017-10-29 ENCOUNTER — Encounter: Payer: Self-pay | Admitting: General Practice

## 2017-10-29 DIAGNOSIS — C154 Malignant neoplasm of middle third of esophagus: Secondary | ICD-10-CM | POA: Diagnosis present

## 2017-10-29 DIAGNOSIS — C159 Malignant neoplasm of esophagus, unspecified: Secondary | ICD-10-CM

## 2017-10-29 DIAGNOSIS — Z5111 Encounter for antineoplastic chemotherapy: Secondary | ICD-10-CM | POA: Diagnosis present

## 2017-10-29 LAB — CBC WITH DIFFERENTIAL (CANCER CENTER ONLY)
BASOS ABS: 0 10*3/uL (ref 0.0–0.1)
BASOS PCT: 1 %
EOS ABS: 0.1 10*3/uL (ref 0.0–0.5)
Eosinophils Relative: 2 %
HCT: 35.4 % — ABNORMAL LOW (ref 38.4–49.9)
HEMOGLOBIN: 11.8 g/dL — AB (ref 13.0–17.1)
LYMPHS ABS: 0.8 10*3/uL — AB (ref 0.9–3.3)
Lymphocytes Relative: 22 %
MCH: 31.8 pg (ref 27.2–33.4)
MCHC: 33.2 g/dL (ref 32.0–36.0)
MCV: 95.7 fL (ref 79.3–98.0)
Monocytes Absolute: 0.4 10*3/uL (ref 0.1–0.9)
Monocytes Relative: 12 %
Neutro Abs: 2.2 10*3/uL (ref 1.5–6.5)
Neutrophils Relative %: 63 %
PLATELETS: 92 10*3/uL — AB (ref 140–400)
RBC: 3.7 MIL/uL — AB (ref 4.20–5.82)
RDW: 13.7 % (ref 11.0–14.6)
WBC: 3.4 10*3/uL — AB (ref 4.0–10.3)

## 2017-10-29 LAB — CMP (CANCER CENTER ONLY)
ALBUMIN: 3.3 g/dL — AB (ref 3.5–5.0)
ALK PHOS: 94 U/L (ref 38–126)
ALT: 13 U/L (ref 0–44)
AST: 22 U/L (ref 15–41)
Anion gap: 7 (ref 5–15)
BUN: 12 mg/dL (ref 8–23)
CO2: 27 mmol/L (ref 22–32)
CREATININE: 1.09 mg/dL (ref 0.61–1.24)
Calcium: 9 mg/dL (ref 8.9–10.3)
Chloride: 105 mmol/L (ref 98–111)
GFR, Estimated: >60 mL/min (ref >60)
GLUCOSE: 228 mg/dL — AB (ref 70–99)
Potassium: 4.2 mmol/L (ref 3.5–5.1)
SODIUM: 139 mmol/L (ref 135–145)
Total Bilirubin: 0.9 mg/dL (ref 0.3–1.2)
Total Protein: 7 g/dL (ref 6.5–8.1)

## 2017-10-29 MED ORDER — PROCHLORPERAZINE MALEATE 10 MG PO TABS: 10.0000 mg | ORAL_TABLET | Freq: Four times a day (QID) | ORAL | 0 refills | Status: DC | PRN

## 2017-10-29 MED ORDER — DEXAMETHASONE 2 MG PO TABS: ORAL_TABLET | ORAL | 0 refills | Status: DC

## 2017-10-29 NOTE — Progress Notes (Signed)
[facility] Psychosocial Distress Screening Clinical Social Work  Clinical Social Work was referred by distress screening protocol.  The patient scored a 5 on the Psychosocial Distress Thermometer which indicates moderate distress. Clinical Social Worker contacted patient by phone to assess for distress and other psychosocial needs. Unable to reach, left VM requesting call back if desired.  Brief explanation of resources available at [facility] left as well as my contact information.    ONCBCN DISTRESS SCREENING 10/28/2017  Screening Type Initial Screening  Distress experienced in past week (1-10) 5  Physical Problem type Pain;Mouth sores/swallowing    Clinical Social Worker follow up needed: No.  If yes, follow up plan:  Await return call if patient desires  Beverely Pace, [place], [place] Worker Phone:  [phone]

## 2017-10-30 ENCOUNTER — Telehealth: Payer: Self-pay

## 2017-10-30 NOTE — Telephone Encounter (Signed)
Called to inform pt that antiemetic and steroid have been sent to pharmacy. Pt voiced understanding of medication and instructions.

## 2017-11-02 ENCOUNTER — Telehealth: Payer: Self-pay | Admitting: Radiation Oncology

## 2017-11-02 ENCOUNTER — Encounter (HOSPITAL_COMMUNITY)
Admission: RE | Admit: 2017-11-02 | Discharge: 2017-11-02 | Disposition: A | Discharge status: Home / Self Care | Payer: Medicare Other | Source: Ambulatory Visit | Attending: Radiation Oncology | Admitting: Radiation Oncology

## 2017-11-02 DIAGNOSIS — C154 Malignant neoplasm of middle third of esophagus: Secondary | ICD-10-CM | POA: Insufficient documentation

## 2017-11-02 LAB — GLUCOSE, CAPILLARY: Glucose-Capillary: 101 mg/dL — ABNORMAL HIGH (ref 70–99)

## 2017-11-02 MED ORDER — FLUDEOXYGLUCOSE F - 18 (FDG) INJECTION
9.4400 | Freq: Once | INTRAVENOUS | Status: AC | PRN
  Administered 2017-11-02: 9.44 via INTRAVENOUS

## 2017-11-02 NOTE — Telephone Encounter (Signed)
I called and spoke with the patient to let him know his PET scan results. He will come tomorrow for simulation and we will fuse these images as well.

## 2017-11-03 ENCOUNTER — Other Ambulatory Visit: Payer: Self-pay | Admitting: Radiation Oncology

## 2017-11-03 ENCOUNTER — Ambulatory Visit
Admission: RE | Admit: 2017-11-03 | Discharge: 2017-11-03 | Disposition: A | Discharge status: Home / Self Care | Payer: Medicare Other | Source: Ambulatory Visit | Attending: Radiation Oncology | Admitting: Radiation Oncology

## 2017-11-03 DIAGNOSIS — C154 Malignant neoplasm of middle third of esophagus: Secondary | ICD-10-CM | POA: Diagnosis present

## 2017-11-03 DIAGNOSIS — Z510 Encounter for antineoplastic radiation therapy: Secondary | ICD-10-CM | POA: Diagnosis not present

## 2017-11-03 MED ORDER — SONAFINE EX EMUL
1.0000 "application " | Freq: Once | CUTANEOUS | Status: AC
  Administered 2017-11-03: 1 via TOPICAL

## 2017-11-03 MED ORDER — HYDROCODONE-ACETAMINOPHEN 7.5-325 MG/15ML PO SOLN: 5.0000 mL | Freq: Four times a day (QID) | ORAL | 0 refills | Status: DC | PRN

## 2017-11-03 NOTE — Progress Notes (Signed)
Pt here for patient teaching.  Pt given Radiation and You booklet and Sonafine.  Reviewed areas of pertinence such as fatigue, hair loss, skin changes and throat changes . Pt able to give teach back of to pat skin,apply Sonafine bid and avoid applying anything to skin within 4 hours of treatment. Pt verbalizes understanding of information given and will contact nursing with any questions or concerns.    Casten Floren M. [place] Kiel[place]a RN, BSN      

## 2017-11-05 DIAGNOSIS — C154 Malignant neoplasm of middle third of esophagus: Secondary | ICD-10-CM | POA: Diagnosis not present

## 2017-11-08 ENCOUNTER — Other Ambulatory Visit: Payer: Self-pay | Admitting: Oncology

## 2017-11-09 ENCOUNTER — Encounter (HOSPITAL_COMMUNITY): Payer: Medicare Other

## 2017-11-09 ENCOUNTER — Other Ambulatory Visit: Payer: Self-pay | Admitting: Oncology

## 2017-11-09 ENCOUNTER — Ambulatory Visit
Admission: RE | Admit: 2017-11-09 | Discharge: 2017-11-09 | Disposition: A | Discharge status: Home / Self Care | Payer: Medicare Other | Source: Ambulatory Visit | Attending: Radiation Oncology | Admitting: Radiation Oncology

## 2017-11-09 DIAGNOSIS — C154 Malignant neoplasm of middle third of esophagus: Secondary | ICD-10-CM | POA: Diagnosis not present

## 2017-11-10 ENCOUNTER — Ambulatory Visit
Admission: RE | Admit: 2017-11-10 | Discharge: 2017-11-10 | Disposition: A | Discharge status: Home / Self Care | Payer: Medicare Other | Source: Ambulatory Visit | Attending: Radiation Oncology | Admitting: Radiation Oncology

## 2017-11-10 ENCOUNTER — Inpatient Hospital Stay: Payer: Medicare Other

## 2017-11-10 VITALS — BP 142/70 | HR 62 | Temp 98.9°F | Resp 17

## 2017-11-10 DIAGNOSIS — C154 Malignant neoplasm of middle third of esophagus: Secondary | ICD-10-CM | POA: Diagnosis not present

## 2017-11-10 DIAGNOSIS — C159 Malignant neoplasm of esophagus, unspecified: Secondary | ICD-10-CM

## 2017-11-10 DIAGNOSIS — Z5111 Encounter for antineoplastic chemotherapy: Secondary | ICD-10-CM | POA: Diagnosis not present

## 2017-11-10 MED ORDER — PALONOSETRON HCL INJECTION 0.25 MG/5ML
INTRAVENOUS | Status: AC
  Filled 2017-11-10: qty 5

## 2017-11-10 MED ORDER — PALONOSETRON HCL INJECTION 0.25 MG/5ML
0.2500 mg | Freq: Once | INTRAVENOUS | Status: AC
  Administered 2017-11-10: 0.25 mg via INTRAVENOUS

## 2017-11-10 MED ORDER — SODIUM CHLORIDE 0.9 % IV SOLN
20.0000 mg | Freq: Once | INTRAVENOUS | Status: AC
  Administered 2017-11-10: 20 mg via INTRAVENOUS
  Filled 2017-11-10: qty 2

## 2017-11-10 MED ORDER — SODIUM CHLORIDE 0.9 % IV SOLN
179.4000 mg | Freq: Once | INTRAVENOUS | Status: AC
  Administered 2017-11-10: 180 mg via INTRAVENOUS
  Filled 2017-11-10: qty 18

## 2017-11-10 MED ORDER — DIPHENHYDRAMINE HCL 50 MG/ML IJ SOLN
INTRAMUSCULAR | Status: AC
  Filled 2017-11-10: qty 1

## 2017-11-10 MED ORDER — SODIUM CHLORIDE 0.9 % IV SOLN
Freq: Once | INTRAVENOUS | Status: AC
  Administered 2017-11-10: 10:00:00 via INTRAVENOUS
  Filled 2017-11-10: qty 250

## 2017-11-10 MED ORDER — SODIUM CHLORIDE 0.9 % IV SOLN
50.0000 mg/m2 | Freq: Once | INTRAVENOUS | Status: AC
  Administered 2017-11-10: 102 mg via INTRAVENOUS
  Filled 2017-11-10: qty 17

## 2017-11-10 MED ORDER — HYDROCODONE-ACETAMINOPHEN 7.5-325 MG/15ML PO SOLN: 5.0000 mL | Freq: Four times a day (QID) | ORAL | 0 refills | Status: DC | PRN

## 2017-11-10 MED ORDER — FAMOTIDINE IN NACL 20-0.9 MG/50ML-% IV SOLN
INTRAVENOUS | Status: AC
  Filled 2017-11-10: qty 50

## 2017-11-10 MED ORDER — DIPHENHYDRAMINE HCL 50 MG/ML IJ SOLN
25.0000 mg | Freq: Once | INTRAMUSCULAR | Status: AC
  Administered 2017-11-10: 25 mg via INTRAVENOUS

## 2017-11-10 MED ORDER — FAMOTIDINE IN NACL 20-0.9 MG/50ML-% IV SOLN
20.0000 mg | Freq: Once | INTRAVENOUS | Status: AC
  Administered 2017-11-10: 20 mg via INTRAVENOUS

## 2017-11-10 NOTE — Progress Notes (Signed)
Ok to treat today with labs from 8/15 and a platelet count of 92 per Dr. Benay Spice.

## 2017-11-10 NOTE — Patient Instructions (Signed)
[facility] Discharge Instructions for Patients Receiving Chemotherapy  Today you received the following chemotherapy agents Taxol, Carbo  To help prevent nausea and vomiting after your treatment, we encourage you to take your nausea medication as directed by MD   If you develop nausea and vomiting that is not controlled by your nausea medication, call the clinic.   BELOW ARE SYMPTOMS THAT SHOULD BE REPORTED IMMEDIATELY:  *FEVER GREATER THAN 100.5 F  *CHILLS WITH OR WITHOUT FEVER  NAUSEA AND VOMITING THAT IS NOT CONTROLLED WITH YOUR NAUSEA MEDICATION  *UNUSUAL SHORTNESS OF BREATH  *UNUSUAL BRUISING OR BLEEDING  TENDERNESS IN MOUTH AND THROAT WITH OR WITHOUT PRESENCE OF ULCERS  *URINARY PROBLEMS  *BOWEL PROBLEMS  UNUSUAL RASH Items with * indicate a potential emergency and should be followed up as soon as possible.  Feel free to call the clinic should you have any questions or concerns. The clinic phone number is [phone].  Please show the [facility] at check-in to the Emergency Department and triage nurse.  Taxol- Paclitaxel injection What is this medicine? PACLITAXEL (PAK li TAX el) is a chemotherapy drug. It targets fast dividing cells, like cancer cells, and causes these cells to die. This medicine is used to treat ovarian cancer, breast cancer, and other cancers. This medicine may be used for other purposes; ask your health care provider or pharmacist if you have questions. COMMON BRAND NAME(S): Onxol, Taxol What should I tell my health care provider before I take this medicine? They need to know if you have any of these conditions: -blood disorders -irregular heartbeat -infection (especially a virus infection such as chickenpox, cold sores, or herpes) -liver disease -previous or ongoing radiation therapy -an unusual or allergic reaction to paclitaxel, alcohol, polyoxyethylated castor oil, other chemotherapy agents, other medicines, foods,  dyes, or preservatives -pregnant or trying to get pregnant -breast-feeding How should I use this medicine? This drug is given as an infusion into a vein. It is administered in a hospital or clinic by a specially trained health care professional. Talk to your pediatrician regarding the use of this medicine in children. Special care may be needed. Overdosage: If you think you have taken too much of this medicine contact a poison control center or emergency room at once. NOTE: This medicine is only for you. Do not share this medicine with others. What if I miss a dose? It is important not to miss your dose. Call your doctor or health care professional if you are unable to keep an appointment. What may interact with this medicine? Do not take this medicine with any of the following medications: -disulfiram -metronidazole This medicine may also interact with the following medications: -cyclosporine -diazepam -ketoconazole -medicines to increase blood counts like filgrastim, pegfilgrastim, sargramostim -other chemotherapy drugs like cisplatin, doxorubicin, epirubicin, etoposide, teniposide, vincristine -quinidine -testosterone -vaccines -verapamil Talk to your doctor or health care professional before taking any of these medicines: -acetaminophen -aspirin -ibuprofen -ketoprofen -naproxen This list may not describe all possible interactions. Give your health care provider a list of all the medicines, herbs, non-prescription drugs, or dietary supplements you use. Also tell them if you smoke, drink alcohol, or use illegal drugs. Some items may interact with your medicine. What should I watch for while using this medicine? Your condition will be monitored carefully while you are receiving this medicine. You will need important blood work done while you are taking this medicine. This medicine can cause serious allergic reactions. To reduce your risk you  will need to take other medicine(s)  before treatment with this medicine. If you experience allergic reactions like skin rash, itching or hives, swelling of the face, lips, or tongue, tell your doctor or health care professional right away. In some cases, you may be given additional medicines to help with side effects. Follow all directions for their use. This drug may make you feel generally unwell. This is not uncommon, as chemotherapy can affect healthy cells as well as cancer cells. Report any side effects. Continue your course of treatment even though you feel ill unless your doctor tells you to stop. Call your doctor or health care professional for advice if you get a fever, chills or sore throat, or other symptoms of a cold or flu. Do not treat yourself. This drug decreases your body's ability to fight infections. Try to avoid being around people who are sick. This medicine may increase your risk to bruise or bleed. Call your doctor or health care professional if you notice any unusual bleeding. Be careful brushing and flossing your teeth or using a toothpick because you may get an infection or bleed more easily. If you have any dental work done, tell your dentist you are receiving this medicine. Avoid taking products that contain aspirin, acetaminophen, ibuprofen, naproxen, or ketoprofen unless instructed by your doctor. These medicines may hide a fever. Do not become pregnant while taking this medicine. Women should inform their doctor if they wish to become pregnant or think they might be pregnant. There is a potential for serious side effects to an unborn child. Talk to your health care professional or pharmacist for more information. Do not breast-feed an infant while taking this medicine. Men are advised not to father a child while receiving this medicine. This product may contain alcohol. Ask your pharmacist or healthcare provider if this medicine contains alcohol. Be sure to tell all healthcare providers you are taking this  medicine. Certain medicines, like metronidazole and disulfiram, can cause an unpleasant reaction when taken with alcohol. The reaction includes flushing, headache, nausea, vomiting, sweating, and increased thirst. The reaction can last from 30 minutes to several hours. What side effects may I notice from receiving this medicine? Side effects that you should report to your doctor or health care professional as soon as possible: -allergic reactions like skin rash, itching or hives, swelling of the face, lips, or tongue -low blood counts - This drug may decrease the number of white blood cells, red blood cells and platelets. You may be at increased risk for infections and bleeding. -signs of infection - fever or chills, cough, sore throat, pain or difficulty passing urine -signs of decreased platelets or bleeding - bruising, pinpoint red spots on the skin, black, tarry stools, nosebleeds -signs of decreased red blood cells - unusually weak or tired, fainting spells, lightheadedness -breathing problems -chest pain -high or low blood pressure -mouth sores -nausea and vomiting -pain, swelling, redness or irritation at the injection site -pain, tingling, numbness in the hands or feet -slow or irregular heartbeat -swelling of the ankle, feet, hands Side effects that usually do not require medical attention (report to your doctor or health care professional if they continue or are bothersome): -bone pain -complete hair loss including hair on your head, underarms, pubic hair, eyebrows, and eyelashes -changes in the color of fingernails -diarrhea -loosening of the fingernails -loss of appetite -muscle or joint pain -red flush to skin -sweating This list may not describe all possible side effects. Call your doctor for   medical advice about side effects. You may report side effects to FDA at 1-800-FDA-1088. Where should I keep my medicine? This drug is given in a hospital or clinic and will not be  stored at home. NOTE: This sheet is a summary. It may not cover all possible information. If you have questions about this medicine, talk to your doctor, pharmacist, or health care provider.  2018 Elsevier/Gold Standard (2015-01-02 19:58:00)  Carbo- Carboplatin injection What is this medicine? CARBOPLATIN (KAR boe pla tin) is a chemotherapy drug. It targets fast dividing cells, like cancer cells, and causes these cells to die. This medicine is used to treat ovarian cancer and many other cancers. This medicine may be used for other purposes; ask your health care provider or pharmacist if you have questions. COMMON BRAND NAME(S): Paraplatin What should I tell my health care provider before I take this medicine? They need to know if you have any of these conditions: -blood disorders -hearing problems -kidney disease -recent or ongoing radiation therapy -an unusual or allergic reaction to carboplatin, cisplatin, other chemotherapy, other medicines, foods, dyes, or preservatives -pregnant or trying to get pregnant -breast-feeding How should I use this medicine? This drug is usually given as an infusion into a vein. It is administered in a hospital or clinic by a specially trained health care professional. Talk to your pediatrician regarding the use of this medicine in children. Special care may be needed. Overdosage: If you think you have taken too much of this medicine contact a poison control center or emergency room at once. NOTE: This medicine is only for you. Do not share this medicine with others. What if I miss a dose? It is important not to miss a dose. Call your doctor or health care professional if you are unable to keep an appointment. What may interact with this medicine? -medicines for seizures -medicines to increase blood counts like filgrastim, pegfilgrastim, sargramostim -some antibiotics like amikacin, gentamicin, neomycin, streptomycin, tobramycin -vaccines Talk to your  doctor or health care professional before taking any of these medicines: -acetaminophen -aspirin -ibuprofen -ketoprofen -naproxen This list may not describe all possible interactions. Give your health care provider a list of all the medicines, herbs, non-prescription drugs, or dietary supplements you use. Also tell them if you smoke, drink alcohol, or use illegal drugs. Some items may interact with your medicine. What should I watch for while using this medicine? Your condition will be monitored carefully while you are receiving this medicine. You will need important blood work done while you are taking this medicine. This drug may make you feel generally unwell. This is not uncommon, as chemotherapy can affect healthy cells as well as cancer cells. Report any side effects. Continue your course of treatment even though you feel ill unless your doctor tells you to stop. In some cases, you may be given additional medicines to help with side effects. Follow all directions for their use. Call your doctor or health care professional for advice if you get a fever, chills or sore throat, or other symptoms of a cold or flu. Do not treat yourself. This drug decreases your body's ability to fight infections. Try to avoid being around people who are sick. This medicine may increase your risk to bruise or bleed. Call your doctor or health care professional if you notice any unusual bleeding. Be careful brushing and flossing your teeth or using a toothpick because you may get an infection or bleed more easily. If you have any dental work done,  tell your dentist you are receiving this medicine. Avoid taking products that contain aspirin, acetaminophen, ibuprofen, naproxen, or ketoprofen unless instructed by your doctor. These medicines may hide a fever. Do not become pregnant while taking this medicine. Women should inform their doctor if they wish to become pregnant or think they might be pregnant. There is a  potential for serious side effects to an unborn child. Talk to your health care professional or pharmacist for more information. Do not breast-feed an infant while taking this medicine. What side effects may I notice from receiving this medicine? Side effects that you should report to your doctor or health care professional as soon as possible: -allergic reactions like skin rash, itching or hives, swelling of the face, lips, or tongue -signs of infection - fever or chills, cough, sore throat, pain or difficulty passing urine -signs of decreased platelets or bleeding - bruising, pinpoint red spots on the skin, black, tarry stools, nosebleeds -signs of decreased red blood cells - unusually weak or tired, fainting spells, lightheadedness -breathing problems -changes in hearing -changes in vision -chest pain -high blood pressure -low blood counts - This drug may decrease the number of white blood cells, red blood cells and platelets. You may be at increased risk for infections and bleeding. -nausea and vomiting -pain, swelling, redness or irritation at the injection site -pain, tingling, numbness in the hands or feet -problems with balance, talking, walking -trouble passing urine or change in the amount of urine Side effects that usually do not require medical attention (report to your doctor or health care professional if they continue or are bothersome): -hair loss -loss of appetite -metallic taste in the mouth or changes in taste This list may not describe all possible side effects. Call your doctor for medical advice about side effects. You may report side effects to FDA at 1-800-FDA-1088. Where should I keep my medicine? This drug is given in a hospital or clinic and will not be stored at home. NOTE: This sheet is a summary. It may not cover all possible information. If you have questions about this medicine, talk to your doctor, pharmacist, or health care provider.  2018 Elsevier/Gold  Standard (2007-06-08 14:38:05)   

## 2017-11-11 ENCOUNTER — Ambulatory Visit
Admission: RE | Admit: 2017-11-11 | Discharge: 2017-11-11 | Disposition: A | Discharge status: Home / Self Care | Payer: Medicare Other | Source: Ambulatory Visit | Attending: Radiation Oncology | Admitting: Radiation Oncology

## 2017-11-11 DIAGNOSIS — C154 Malignant neoplasm of middle third of esophagus: Secondary | ICD-10-CM | POA: Diagnosis not present

## 2017-11-12 ENCOUNTER — Ambulatory Visit
Admission: RE | Admit: 2017-11-12 | Discharge: 2017-11-12 | Disposition: A | Discharge status: Home / Self Care | Payer: Medicare Other | Source: Ambulatory Visit | Attending: Radiation Oncology | Admitting: Radiation Oncology

## 2017-11-12 DIAGNOSIS — C154 Malignant neoplasm of middle third of esophagus: Secondary | ICD-10-CM | POA: Diagnosis not present

## 2017-11-13 ENCOUNTER — Ambulatory Visit
Admission: RE | Admit: 2017-11-13 | Discharge: 2017-11-13 | Disposition: A | Discharge status: Home / Self Care | Payer: Medicare Other | Source: Ambulatory Visit | Attending: Radiation Oncology | Admitting: Radiation Oncology

## 2017-11-13 DIAGNOSIS — C154 Malignant neoplasm of middle third of esophagus: Secondary | ICD-10-CM | POA: Diagnosis not present

## 2017-11-16 ENCOUNTER — Other Ambulatory Visit: Payer: Self-pay | Admitting: Oncology

## 2017-11-17 ENCOUNTER — Inpatient Hospital Stay: Payer: Medicare Other

## 2017-11-17 ENCOUNTER — Inpatient Hospital Stay: Payer: Medicare Other | Attending: Oncology | Admitting: Nurse Practitioner

## 2017-11-17 ENCOUNTER — Other Ambulatory Visit: Payer: Self-pay

## 2017-11-17 ENCOUNTER — Ambulatory Visit
Admission: RE | Admit: 2017-11-17 | Discharge: 2017-11-17 | Disposition: A | Discharge status: Home / Self Care | Payer: Medicare Other | Source: Ambulatory Visit | Attending: Radiation Oncology | Admitting: Radiation Oncology

## 2017-11-17 ENCOUNTER — Encounter: Payer: Self-pay | Admitting: Nurse Practitioner

## 2017-11-17 VITALS — BP 137/77 | HR 69 | Temp 99.4°F | Resp 18 | Ht 66.0 in | Wt 183.5 lb

## 2017-11-17 DIAGNOSIS — Z8546 Personal history of malignant neoplasm of prostate: Secondary | ICD-10-CM

## 2017-11-17 DIAGNOSIS — R630 Anorexia: Secondary | ICD-10-CM | POA: Diagnosis not present

## 2017-11-17 DIAGNOSIS — C154 Malignant neoplasm of middle third of esophagus: Secondary | ICD-10-CM | POA: Insufficient documentation

## 2017-11-17 DIAGNOSIS — C159 Malignant neoplasm of esophagus, unspecified: Secondary | ICD-10-CM

## 2017-11-17 DIAGNOSIS — D6959 Other secondary thrombocytopenia: Secondary | ICD-10-CM | POA: Diagnosis not present

## 2017-11-17 DIAGNOSIS — D696 Thrombocytopenia, unspecified: Secondary | ICD-10-CM

## 2017-11-17 DIAGNOSIS — I2510 Atherosclerotic heart disease of native coronary artery without angina pectoris: Secondary | ICD-10-CM | POA: Insufficient documentation

## 2017-11-17 DIAGNOSIS — C9000 Multiple myeloma not having achieved remission: Secondary | ICD-10-CM

## 2017-11-17 DIAGNOSIS — J449 Chronic obstructive pulmonary disease, unspecified: Secondary | ICD-10-CM | POA: Insufficient documentation

## 2017-11-17 DIAGNOSIS — R070 Pain in throat: Secondary | ICD-10-CM | POA: Diagnosis not present

## 2017-11-17 DIAGNOSIS — Z5111 Encounter for antineoplastic chemotherapy: Secondary | ICD-10-CM | POA: Insufficient documentation

## 2017-11-17 DIAGNOSIS — I739 Peripheral vascular disease, unspecified: Secondary | ICD-10-CM | POA: Insufficient documentation

## 2017-11-17 DIAGNOSIS — Z510 Encounter for antineoplastic radiation therapy: Secondary | ICD-10-CM | POA: Insufficient documentation

## 2017-11-17 DIAGNOSIS — Z23 Encounter for immunization: Secondary | ICD-10-CM | POA: Insufficient documentation

## 2017-11-17 DIAGNOSIS — R1310 Dysphagia, unspecified: Secondary | ICD-10-CM | POA: Diagnosis not present

## 2017-11-17 DIAGNOSIS — D701 Agranulocytosis secondary to cancer chemotherapy: Secondary | ICD-10-CM | POA: Insufficient documentation

## 2017-11-17 DIAGNOSIS — Z950 Presence of cardiac pacemaker: Secondary | ICD-10-CM | POA: Diagnosis not present

## 2017-11-17 LAB — CMP (CANCER CENTER ONLY)
ALT: 27 U/L (ref 0–44)
AST: 26 U/L (ref 15–41)
Albumin: 3.2 g/dL — ABNORMAL LOW (ref 3.5–5.0)
Alkaline Phosphatase: 112 U/L (ref 38–126)
Anion gap: 8 (ref 5–15)
BILIRUBIN TOTAL: 1.2 mg/dL (ref 0.3–1.2)
BUN: 14 mg/dL (ref 8–23)
CHLORIDE: 104 mmol/L (ref 98–111)
CO2: 25 mmol/L (ref 22–32)
CREATININE: 0.92 mg/dL (ref 0.61–1.24)
Calcium: 8.8 mg/dL — ABNORMAL LOW (ref 8.9–10.3)
GFR, Est AFR Am: >60 mL/min (ref >60)
Glucose, Bld: 96 mg/dL (ref 70–99)
Potassium: 4.4 mmol/L (ref 3.5–5.1)
Sodium: 137 mmol/L (ref 135–145)
TOTAL PROTEIN: 6.8 g/dL (ref 6.5–8.1)

## 2017-11-17 LAB — CBC WITH DIFFERENTIAL (CANCER CENTER ONLY)
Basophils Absolute: 0 10*3/uL (ref 0.0–0.1)
Basophils Relative: 0 %
EOS PCT: 3 %
Eosinophils Absolute: 0.1 10*3/uL (ref 0.0–0.5)
HCT: 33.8 % — ABNORMAL LOW (ref 38.4–49.9)
Hemoglobin: 11.4 g/dL — ABNORMAL LOW (ref 13.0–17.1)
LYMPHS ABS: 0.5 10*3/uL — AB (ref 0.9–3.3)
LYMPHS PCT: 19 %
MCH: 31.7 pg (ref 27.2–33.4)
MCHC: 33.7 g/dL (ref 32.0–36.0)
MCV: 93.9 fL (ref 79.3–98.0)
MONO ABS: 0.2 10*3/uL (ref 0.1–0.9)
Monocytes Relative: 6 %
Neutro Abs: 2 10*3/uL (ref 1.5–6.5)
Neutrophils Relative %: 72 %
PLATELETS: 74 10*3/uL — AB (ref 140–400)
RBC: 3.6 MIL/uL — AB (ref 4.20–5.82)
RDW: 13 % (ref 11.0–14.6)
WBC Count: 2.8 10*3/uL — ABNORMAL LOW (ref 4.0–10.3)

## 2017-11-17 MED ORDER — DEXAMETHASONE SODIUM PHOSPHATE 4 MG/ML IJ SOLN
10.0000 mg | Freq: Once | INTRAMUSCULAR | Status: DC
  Filled 2017-11-17: qty 3

## 2017-11-17 MED ORDER — DEXAMETHASONE SODIUM PHOSPHATE 10 MG/ML IJ SOLN
INTRAMUSCULAR | Status: AC
  Filled 2017-11-17: qty 1

## 2017-11-17 MED ORDER — SODIUM CHLORIDE 0.9 % IV SOLN
50.0000 mg/m2 | Freq: Once | INTRAVENOUS | Status: AC
  Administered 2017-11-17: 102 mg via INTRAVENOUS
  Filled 2017-11-17: qty 17

## 2017-11-17 MED ORDER — DIPHENHYDRAMINE HCL 50 MG/ML IJ SOLN
25.0000 mg | Freq: Once | INTRAMUSCULAR | Status: AC
  Administered 2017-11-17: 25 mg via INTRAVENOUS

## 2017-11-17 MED ORDER — SODIUM CHLORIDE 0.9 % IV SOLN: 10.0000 mg | Freq: Once | INTRAVENOUS | Status: DC

## 2017-11-17 MED ORDER — FAMOTIDINE IN NACL 20-0.9 MG/50ML-% IV SOLN
20.0000 mg | Freq: Once | INTRAVENOUS | Status: AC
  Administered 2017-11-17: 20 mg via INTRAVENOUS

## 2017-11-17 MED ORDER — DEXAMETHASONE SODIUM PHOSPHATE 10 MG/ML IJ SOLN
10.0000 mg | Freq: Once | INTRAMUSCULAR | Status: AC
  Administered 2017-11-17: 10 mg via INTRAVENOUS

## 2017-11-17 MED ORDER — SODIUM CHLORIDE 0.9 % IV SOLN
Freq: Once | INTRAVENOUS | Status: AC
  Administered 2017-11-17: 16:00:00 via INTRAVENOUS
  Filled 2017-11-17: qty 250

## 2017-11-17 MED ORDER — FAMOTIDINE IN NACL 20-0.9 MG/50ML-% IV SOLN
INTRAVENOUS | Status: AC
  Filled 2017-11-17: qty 50

## 2017-11-17 MED ORDER — DIPHENHYDRAMINE HCL 50 MG/ML IJ SOLN
INTRAMUSCULAR | Status: AC
  Filled 2017-11-17: qty 1

## 2017-11-17 NOTE — Progress Notes (Addendum)
  [facility] OFFICE PROGRESS NOTE   Diagnosis: Esophagus cancer  INTERVAL HISTORY:   [name] returns as scheduled.  He began radiation 11/09/2017.  He completed cycle 1 weekly Taxol/carboplatin 11/10/2017.  He denies nausea/vomiting.  No mouth sores.  No diarrhea.  No numbness or tingling in his hands or feet.  He is overall tolerating regular diet.  He thinks the chest pain is somewhat better.  He continues pain medication every 6-8 hours.  He felt "chilled" last night.  He wonders if he had a fever.  He went to bed and felt fine upon awakening this morning.  Objective:  Vital signs in last 24 hours:  Blood pressure 137/77, pulse 69, temperature 99.4 F (37.4 C), temperature source Oral, resp. rate 18, height 5\' 6"  (1.676 m), weight 183 lb 8 oz (83.2 kg), SpO2 98 %.    HEENT: No thrush or ulcers. Resp: Lungs clear bilaterally. Cardio: Regular rate and rhythm. GI: Abdomen soft and nontender.  No hepatomegaly. Vascular: No leg edema.  Lab Results:  Lab Results  Component Value Date   WBC 2.8 (L) 11/17/2017   HGB 11.4 (L) 11/17/2017   HCT 33.8 (L) 11/17/2017   MCV 93.9 11/17/2017   PLT 74 (L) 11/17/2017   NEUTROABS 2.0 11/17/2017    Imaging:  No results found.  Medications: I have reviewed the patient's current medications.  Assessment/Plan: 1. 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 Staging CTs 10/26/2017-asymmetric esophageal thickening, no evidence of metastatic disease ? Endoscopic biopsy 10/20/2017-mid esophagus partially obstructing mass, biopsy confirmed squamous cell carcinoma ? PET scan 11/02/2017-wall thickening in the mid esophagus SUV 27 MA: No findings specific for metastatic disease ? Radiation beginning 11/09/2017 ? Cycle 1 weekly Taxol/carboplatin 11/10/2017 ? Cycle 2 weekly Taxol/carboplatin 11/17/2017 (carboplatin held due to thrombocytopenia) 2. Solid dysphagia and odynophagia secondary to #1 3. Coronary artery  disease 4. History of prostate cancer 5. COPD 6. Permanent cardiac pacemaker 7. Peripheral vascular disease 8. Chronic thrombocytopenia  Disposition: [name] appears stable.  He continues radiation.  He has completed 1 cycle of weekly Taxol/carboplatin.  Plan to proceed with week 2 today as scheduled with the exception of carboplatin which will be held due to thrombocytopenia.    He will return for lab, follow-up and week 3 Taxol/carboplatin in 1 week.  He will contact the office in the interim with any problems.  We specifically discussed any bleeding.  Patient seen with Dr. Benay Spice.     Ned Card ANP/GNP-BC   11/17/2017  3:15 PM This was a shared visit with Ned Card.  [name] tolerated the first week of chemotherapy well.  He has moderate thrombocytopenia today.  He had thrombocytopenia predating chemotherapy, potentially related to cirrhosis.  We decided to hold carboplatin today.  He will return for an office visit next week.  Julieanne Manson, MD

## 2017-11-17 NOTE — Progress Notes (Signed)
Nutrition Assessment   Reason for Assessment:   Referral for new esophageal cancer.     ASSESSMENT:  83 year old male with new diagnosis of esophageal cancer. Patient undergoing chemotherapy and radiation therapy.  Noted note a surgical candidate.  Past medical history of COPD, GERD, HTN, HLD, MI, AAA, etoh hepatitis, aortic stenosis, CAD  Met with patient in lobby with patient's permission.  Running behind in infusion also needing more lab work to be drawn.  Patient reports tolerating all consistency of food but has no appetite.  Reports that he ate 2 pieces of [employer] fried chicken last night without difficulty.  Has not tried oral nutrition supplements.    Nutrition Focused Physical Exam: unable to perform   Medications: fe sulfate, omega 3, protonix, vitamin C   Labs: reviewed   Anthropometrics:   Height: 66 inches Weight: 183 lb 8 oz today UBW: 198 lb 09/14/17 BMI: 30  7% weight loss in the last 2 months, significant   Estimated Energy Needs  Kcals: 2000-2500 calories/d Protein: 98-130 g/d Fluid: 2.5 L/d   NUTRITION DIAGNOSIS: Inadequate oral intake related to cancer and cancer related treatment side effects as evidenced by 7% weight loss in 2 months    INTERVENTION:  Discussed briefly soft, moist protein foods and handout provided Discussed oral nutrition supplements and encouraged patient to try at least 2 per day.  Coupons given    MONITORING, EVALUATION, GOAL: weight trends, intake   Next Visit: September, 17 during infusion  [name], [place], [place] Registered Dietitian [phone] (pager)

## 2017-11-17 NOTE — Progress Notes (Signed)
Per Dr Benay Spice ok to tx w/o CMP results today. Ok with CBC to tx.

## 2017-11-17 NOTE — Patient Instructions (Addendum)
[facility] Discharge Instructions for Patients Receiving Chemotherapy  Today you received the following chemotherapy agents Taxol  To help prevent nausea and vomiting after your treatment, we encourage you to take your nausea medication as directed by MD   If you develop nausea and vomiting that is not controlled by your nausea medication, call the clinic.   BELOW ARE SYMPTOMS THAT SHOULD BE REPORTED IMMEDIATELY:  *FEVER GREATER THAN 100.5 F  *CHILLS WITH OR WITHOUT FEVER  NAUSEA AND VOMITING THAT IS NOT CONTROLLED WITH YOUR NAUSEA MEDICATION  *UNUSUAL SHORTNESS OF BREATH  *UNUSUAL BRUISING OR BLEEDING  TENDERNESS IN MOUTH AND THROAT WITH OR WITHOUT PRESENCE OF ULCERS  *URINARY PROBLEMS  *BOWEL PROBLEMS  UNUSUAL RASH Items with * indicate a potential emergency and should be followed up as soon as possible.  Feel free to call the clinic should you have any questions or concerns. The clinic phone number is [phone].  Please show the [facility] at check-in to the Emergency Department and triage nurse.  Taxol- Paclitaxel injection What is this medicine? PACLITAXEL (PAK li TAX el) is a chemotherapy drug. It targets fast dividing cells, like cancer cells, and causes these cells to die. This medicine is used to treat ovarian cancer, breast cancer, and other cancers. This medicine may be used for other purposes; ask your health care provider or pharmacist if you have questions. COMMON BRAND NAME(S): Onxol, Taxol What should I tell my health care provider before I take this medicine? They need to know if you have any of these conditions: -blood disorders -irregular heartbeat -infection (especially a virus infection such as chickenpox, cold sores, or herpes) -liver disease -previous or ongoing radiation therapy -an unusual or allergic reaction to paclitaxel, alcohol, polyoxyethylated castor oil, other chemotherapy agents, other medicines, foods, dyes, or  preservatives -pregnant or trying to get pregnant -breast-feeding How should I use this medicine? This drug is given as an infusion into a vein. It is administered in a hospital or clinic by a specially trained health care professional. Talk to your pediatrician regarding the use of this medicine in children. Special care may be needed. Overdosage: If you think you have taken too much of this medicine contact a poison control center or emergency room at once. NOTE: This medicine is only for you. Do not share this medicine with others. What if I miss a dose? It is important not to miss your dose. Call your doctor or health care professional if you are unable to keep an appointment. What may interact with this medicine? Do not take this medicine with any of the following medications: -disulfiram -metronidazole This medicine may also interact with the following medications: -cyclosporine -diazepam -ketoconazole -medicines to increase blood counts like filgrastim, pegfilgrastim, sargramostim -other chemotherapy drugs like cisplatin, doxorubicin, epirubicin, etoposide, teniposide, vincristine -quinidine -testosterone -vaccines -verapamil Talk to your doctor or health care professional before taking any of these medicines: -acetaminophen -aspirin -ibuprofen -ketoprofen -naproxen This list may not describe all possible interactions. Give your health care provider a list of all the medicines, herbs, non-prescription drugs, or dietary supplements you use. Also tell them if you smoke, drink alcohol, or use illegal drugs. Some items may interact with your medicine. What should I watch for while using this medicine? Your condition will be monitored carefully while you are receiving this medicine. You will need important blood work done while you are taking this medicine. This medicine can cause serious allergic reactions. To reduce your risk you will  need to take other medicine(s) before  treatment with this medicine. If you experience allergic reactions like skin rash, itching or hives, swelling of the face, lips, or tongue, tell your doctor or health care professional right away. In some cases, you may be given additional medicines to help with side effects. Follow all directions for their use. This drug may make you feel generally unwell. This is not uncommon, as chemotherapy can affect healthy cells as well as cancer cells. Report any side effects. Continue your course of treatment even though you feel ill unless your doctor tells you to stop. Call your doctor or health care professional for advice if you get a fever, chills or sore throat, or other symptoms of a cold or flu. Do not treat yourself. This drug decreases your body's ability to fight infections. Try to avoid being around people who are sick. This medicine may increase your risk to bruise or bleed. Call your doctor or health care professional if you notice any unusual bleeding. Be careful brushing and flossing your teeth or using a toothpick because you may get an infection or bleed more easily. If you have any dental work done, tell your dentist you are receiving this medicine. Avoid taking products that contain aspirin, acetaminophen, ibuprofen, naproxen, or ketoprofen unless instructed by your doctor. These medicines may hide a fever. Do not become pregnant while taking this medicine. Women should inform their doctor if they wish to become pregnant or think they might be pregnant. There is a potential for serious side effects to an unborn child. Talk to your health care professional or pharmacist for more information. Do not breast-feed an infant while taking this medicine. Men are advised not to father a child while receiving this medicine. This product may contain alcohol. Ask your pharmacist or healthcare provider if this medicine contains alcohol. Be sure to tell all healthcare providers you are taking this medicine.  Certain medicines, like metronidazole and disulfiram, can cause an unpleasant reaction when taken with alcohol. The reaction includes flushing, headache, nausea, vomiting, sweating, and increased thirst. The reaction can last from 30 minutes to several hours. What side effects may I notice from receiving this medicine? Side effects that you should report to your doctor or health care professional as soon as possible: -allergic reactions like skin rash, itching or hives, swelling of the face, lips, or tongue -low blood counts - This drug may decrease the number of white blood cells, red blood cells and platelets. You may be at increased risk for infections and bleeding. -signs of infection - fever or chills, cough, sore throat, pain or difficulty passing urine -signs of decreased platelets or bleeding - bruising, pinpoint red spots on the skin, black, tarry stools, nosebleeds -signs of decreased red blood cells - unusually weak or tired, fainting spells, lightheadedness -breathing problems -chest pain -high or low blood pressure -mouth sores -nausea and vomiting -pain, swelling, redness or irritation at the injection site -pain, tingling, numbness in the hands or feet -slow or irregular heartbeat -swelling of the ankle, feet, hands Side effects that usually do not require medical attention (report to your doctor or health care professional if they continue or are bothersome): -bone pain -complete hair loss including hair on your head, underarms, pubic hair, eyebrows, and eyelashes -changes in the color of fingernails -diarrhea -loosening of the fingernails -loss of appetite -muscle or joint pain -red flush to skin -sweating This list may not describe all possible side effects. Call your doctor for medical  advice about side effects. You may report side effects to FDA at 1-800-FDA-1088. Where should I keep my medicine? This drug is given in a hospital or clinic and will not be stored at  home. NOTE: This sheet is a summary. It may not cover all possible information. If you have questions about this medicine, talk to your doctor, pharmacist, or health care provider.  2018 Elsevier/Gold Standard (2015-01-02 19:58:00)   Thrombocytopenia Thrombocytopenia means that you have a low number of platelets in your blood. Platelets are tiny cells in the blood. When you bleed, they clump together at the cut or injury to stop the bleeding. This is called blood clotting. Not having enough platelets can cause bleeding problems. Follow these instructions at home: General instructions  Check your skin and inside your mouth for bruises or blood as told by your doctor.  Check to see if there is blood in your spit (sputum), pee (urine), and poop (stool). Do this as told by your doctor.  Ask your doctor if you can drink alcohol.  Take over-the-counter and prescription medicines only as told by your doctor.  Tell all of your doctors that you have this condition. Be sure to tell your dentist and eye doctor too. Activity  Do not do activities that can cause bumps or bruises until your doctor says it is okay.  Be careful not to cut yourself: ? When you shave. ? When you use scissors, needles, knives, or other tools.  Be careful not to burn yourself: ? When you use an iron. ? When you cook. Contact a doctor if:  You have bruises and you do not know why. Get help right away if:  You are bleeding anywhere on your body.  You have blood in your spit, pee, or poop. This information is not intended to replace advice given to you by your health care provider. Make sure you discuss any questions you have with your health care provider. Document Released: 02/20/2011 Document Revised: 11/04/2015 Document Reviewed: 09/04/2014 Elsevier Interactive Patient Education  [employer].

## 2017-11-18 ENCOUNTER — Ambulatory Visit
Admission: RE | Admit: 2017-11-18 | Discharge: 2017-11-18 | Disposition: A | Discharge status: Home / Self Care | Payer: Medicare Other | Source: Ambulatory Visit | Attending: Radiation Oncology | Admitting: Radiation Oncology

## 2017-11-18 ENCOUNTER — Telehealth: Payer: Self-pay | Admitting: Oncology

## 2017-11-18 DIAGNOSIS — C154 Malignant neoplasm of middle third of esophagus: Secondary | ICD-10-CM | POA: Diagnosis not present

## 2017-11-18 NOTE — Telephone Encounter (Signed)
Scheduled apt per 9/3 los - left message for patient with appt date and time.

## 2017-11-19 ENCOUNTER — Ambulatory Visit
Admission: RE | Admit: 2017-11-19 | Discharge: 2017-11-19 | Disposition: A | Discharge status: Home / Self Care | Payer: Medicare Other | Source: Ambulatory Visit | Attending: Radiation Oncology | Admitting: Radiation Oncology

## 2017-11-19 DIAGNOSIS — C154 Malignant neoplasm of middle third of esophagus: Secondary | ICD-10-CM | POA: Diagnosis not present

## 2017-11-20 ENCOUNTER — Ambulatory Visit
Admission: RE | Admit: 2017-11-20 | Discharge: 2017-11-20 | Disposition: A | Discharge status: Home / Self Care | Payer: Medicare Other | Source: Ambulatory Visit | Attending: Radiation Oncology | Admitting: Radiation Oncology

## 2017-11-20 DIAGNOSIS — C154 Malignant neoplasm of middle third of esophagus: Secondary | ICD-10-CM | POA: Diagnosis not present

## 2017-11-22 ENCOUNTER — Other Ambulatory Visit: Payer: Self-pay | Admitting: Oncology

## 2017-11-23 ENCOUNTER — Ambulatory Visit
Admission: RE | Admit: 2017-11-23 | Discharge: 2017-11-23 | Disposition: A | Discharge status: Home / Self Care | Payer: Medicare Other | Source: Ambulatory Visit | Attending: Radiation Oncology | Admitting: Radiation Oncology

## 2017-11-23 ENCOUNTER — Encounter: Payer: Self-pay | Admitting: Cardiology

## 2017-11-23 DIAGNOSIS — C154 Malignant neoplasm of middle third of esophagus: Secondary | ICD-10-CM | POA: Diagnosis not present

## 2017-11-23 NOTE — Progress Notes (Signed)
  Radiation Oncology         [phone] ________________________________  Name: [name] MRN: [id number]  Date: 11/03/2017  DOB: [birth year]  SIMULATION AND TREATMENT PLANNING NOTE  DIAGNOSIS:     ICD-10-CM   1. 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 Malignant tumor of middle third of esophagus ([place]) C15.4      Site:  esophagus  NARRATIVE:  The patient was brought to the [facility].  Identity was confirmed.  All relevant records and images related to the planned course of therapy were reviewed.   Written consent to proceed with treatment was confirmed which was freely given after reviewing the details related to the planned course of therapy had been reviewed with the patient.  Then, the patient was set-up in a stable reproducible supine position for radiation therapy.  CT images were obtained.  Surface markings were placed.    Medically necessary complex treatment device(s) for immobilization:  vac-lock bag.   The CT images were loaded into the planning software.  Then the target and avoidance structures were contoured.  Treatment planning then occurred.  The radiation prescription was entered and confirmed. I have requested : Intensity Modulated Radiotherapy (IMRT) is medically necessary for this case for the following reason:  sparing of nearby critical normal structures including the spinal cord, heart, and lungs.   The patient will undergo daily image guidance to ensure accurate localization of the target, and adequate minimize dose to the normal surrounding structures in close proximity to the target.   PLAN:  The patient will receive 50 Gy in 25 fractions initially, also using a simultaneous boost technique to 45 Gy. A boost will then be given to 6 Gy/ 5.4 Gy using a SIB technique to yield 56 Gy to the high dose target and 50.4 Gy to the lower dose target.  Special treatment procedure The patient will  also receive concurrent chemotherapy during the treatment. The patient may therefore experience increased toxicity or side effects and the patient will be monitored for such problems. This may require extra lab work as necessary. This therefore constitutes a special treatment procedure.   ________________________________   Jodelle Gross, MD, PhD

## 2017-11-24 ENCOUNTER — Inpatient Hospital Stay (HOSPITAL_BASED_OUTPATIENT_CLINIC_OR_DEPARTMENT_OTHER): Payer: Medicare Other | Admitting: Nurse Practitioner

## 2017-11-24 ENCOUNTER — Inpatient Hospital Stay: Payer: Medicare Other

## 2017-11-24 ENCOUNTER — Ambulatory Visit
Admission: RE | Admit: 2017-11-24 | Discharge: 2017-11-24 | Disposition: A | Discharge status: Home / Self Care | Payer: Medicare Other | Source: Ambulatory Visit | Attending: Radiation Oncology | Admitting: Radiation Oncology

## 2017-11-24 ENCOUNTER — Telehealth: Payer: Self-pay | Admitting: Oncology

## 2017-11-24 ENCOUNTER — Encounter: Payer: Self-pay | Admitting: Nurse Practitioner

## 2017-11-24 VITALS — BP 107/67 | HR 67 | Temp 98.5°F | Resp 17 | Ht 66.0 in | Wt 181.1 lb

## 2017-11-24 DIAGNOSIS — R630 Anorexia: Secondary | ICD-10-CM

## 2017-11-24 DIAGNOSIS — C154 Malignant neoplasm of middle third of esophagus: Secondary | ICD-10-CM

## 2017-11-24 DIAGNOSIS — I739 Peripheral vascular disease, unspecified: Secondary | ICD-10-CM

## 2017-11-24 DIAGNOSIS — D701 Agranulocytosis secondary to cancer chemotherapy: Secondary | ICD-10-CM

## 2017-11-24 DIAGNOSIS — C159 Malignant neoplasm of esophagus, unspecified: Secondary | ICD-10-CM

## 2017-11-24 DIAGNOSIS — Z950 Presence of cardiac pacemaker: Secondary | ICD-10-CM

## 2017-11-24 DIAGNOSIS — R070 Pain in throat: Secondary | ICD-10-CM

## 2017-11-24 DIAGNOSIS — J449 Chronic obstructive pulmonary disease, unspecified: Secondary | ICD-10-CM

## 2017-11-24 DIAGNOSIS — Z5111 Encounter for antineoplastic chemotherapy: Secondary | ICD-10-CM | POA: Diagnosis not present

## 2017-11-24 DIAGNOSIS — Z8546 Personal history of malignant neoplasm of prostate: Secondary | ICD-10-CM

## 2017-11-24 DIAGNOSIS — I2510 Atherosclerotic heart disease of native coronary artery without angina pectoris: Secondary | ICD-10-CM | POA: Diagnosis not present

## 2017-11-24 DIAGNOSIS — R197 Diarrhea, unspecified: Secondary | ICD-10-CM

## 2017-11-24 LAB — CBC WITH DIFFERENTIAL (CANCER CENTER ONLY)
Basophils Absolute: 0 10*3/uL (ref 0.0–0.1)
Basophils Relative: 2 %
EOS ABS: 0 10*3/uL (ref 0.0–0.5)
EOS PCT: 3 %
HCT: 33.2 % — ABNORMAL LOW (ref 38.4–49.9)
Hemoglobin: 11.1 g/dL — ABNORMAL LOW (ref 13.0–17.1)
LYMPHS ABS: 0.3 10*3/uL — AB (ref 0.9–3.3)
Lymphocytes Relative: 45 %
MCH: 31.6 pg (ref 27.2–33.4)
MCHC: 35 g/dL (ref 32.0–36.0)
MCV: 90.3 fL (ref 79.3–98.0)
MONOS PCT: 19 %
Monocytes Absolute: 0.1 10*3/uL (ref 0.1–0.9)
NEUTROS PCT: 31 %
Neutro Abs: 0.2 10*3/uL — CL (ref 1.5–6.5)
PLATELETS: 85 10*3/uL — AB (ref 140–400)
RBC: 3.51 MIL/uL — ABNORMAL LOW (ref 4.20–5.82)
RDW: 12.8 % (ref 11.0–14.6)
WBC Count: 0.5 10*3/uL — CL (ref 4.0–10.3)

## 2017-11-24 LAB — CMP (CANCER CENTER ONLY)
ALBUMIN: 2.9 g/dL — AB (ref 3.5–5.0)
ALT: 18 U/L (ref 0–44)
AST: 21 U/L (ref 15–41)
Alkaline Phosphatase: 94 U/L (ref 38–126)
Anion gap: 12 (ref 5–15)
BILIRUBIN TOTAL: 1.4 mg/dL — AB (ref 0.3–1.2)
BUN: 20 mg/dL (ref 8–23)
CO2: 22 mmol/L (ref 22–32)
Calcium: 8.9 mg/dL (ref 8.9–10.3)
Chloride: 103 mmol/L (ref 98–111)
Creatinine: 1.08 mg/dL (ref 0.61–1.24)
GFR, Estimated: >60 mL/min (ref >60)
GLUCOSE: 130 mg/dL — AB (ref 70–99)
POTASSIUM: 3.4 mmol/L — AB (ref 3.5–5.1)
SODIUM: 137 mmol/L (ref 135–145)
TOTAL PROTEIN: 6.2 g/dL — AB (ref 6.5–8.1)

## 2017-11-24 MED ORDER — HYDROCODONE-ACETAMINOPHEN 7.5-325 MG/15ML PO SOLN: 5.0000 mL | Freq: Four times a day (QID) | ORAL | 0 refills | Status: DC | PRN

## 2017-11-24 MED ORDER — CIPROFLOXACIN 500 MG/5ML (10%) PO SUSR: 500.0000 mg | Freq: Two times a day (BID) | ORAL | 0 refills | Status: DC

## 2017-11-24 NOTE — Telephone Encounter (Signed)
Scheduled appt per 9/10 los - gave patient AVS and calender per los.   

## 2017-11-24 NOTE — Progress Notes (Signed)
[facility] OFFICE PROGRESS NOTE   Diagnosis: Esophagus cancer  INTERVAL HISTORY:   [name] returns as scheduled.  He continues radiation.  He completed cycle 2 weekly Taxol/carboplatin 11/17/2017.  Carboplatin was held due to thrombocytopenia.  He feels weak and tired.  No nausea or vomiting.  No mouth sores.  He began having diarrhea 11/21/2017.  This persisted until last night.  He was having multiple loose stools each day.  He had no bowel movements during the night and none so far today.  No numbness or tingling in his hands or feet.  Appetite is poor.  He has having difficulty swallowing pills at times.  He is tolerating solids and liquids without difficulty.  He has felt "feverish" intermittently.  He has not checked his temperature.  He reports taking Tylenol chronically 6 to 8 tablets a day for arthritis pain.  He takes hydrocodone for throat pain.  Objective:  Vital signs in last 24 hours:  Blood pressure 107/67, pulse 67, temperature 98.5 F (36.9 C), temperature source Oral, resp. rate 17, height '5\' 6"'  (1.676 m), weight 181 lb 1.6 oz (82.1 kg), SpO2 99 %.    HEENT: No thrush or ulcers. Resp: Lungs clear bilaterally. Cardio: Regular rate and rhythm. GI: Abdomen soft and nontender.  No hepatomegaly. Vascular: No leg edema. Neuro: Alert and oriented.    Lab Results:  Lab Results  Component Value Date   WBC 0.5 (LL) 11/24/2017   HGB 11.1 (L) 11/24/2017   HCT 33.2 (L) 11/24/2017   MCV 90.3 11/24/2017   PLT 85 (L) 11/24/2017   NEUTROABS 0.2 (LL) 11/24/2017    Imaging:  No results found.  Medications: I have reviewed the patient's current medications.  Assessment/Plan: 1. 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 Staging CTs 10/26/2017-asymmetric esophageal thickening, no evidence of metastatic disease ? Endoscopic biopsy 10/20/2017-mid esophagus partially obstructing mass, biopsy confirmed squamous cell carcinoma ? PET scan  11/02/2017-wall thickening in the mid esophagus SUV 27 MA: No findings specific for metastatic disease ? Radiation beginning 11/09/2017 ? Cycle 1 weekly Taxol/carboplatin 11/10/2017 ? Cycle 2 weekly Taxol/carboplatin 11/17/2017 (carboplatin held due to thrombocytopenia) 2. Solid dysphagia and odynophagia secondary to #1 3. Coronary artery disease 4. History of prostate cancer 5. COPD 6. Permanent cardiac pacemaker 7. Peripheral vascular disease 8. Chronic thrombocytopenia 9. Neutropenia secondary to chemotherapy.  Disposition: [name] appears stable.  He continues radiation.  He has completed 2 cycles of weekly chemotherapy.  Last week he received Taxol alone, carboplatin held due to thrombocytopenia.  The platelet count is higher today.  He has severe neutropenia.  We are holding today's chemotherapy.  Neutropenic precautions were reviewed.  He and his family member understand he should contact the office immediately with high fever, shaking chills, other signs of infection.  He will begin ciprofloxacin 500 mg twice daily.  He will return for a repeat CBC on 11/26/2017.  Etiology of the diarrhea is unclear.  He was provided with a C. difficile collection kit if the diarrhea recurs.  The office with recurrent diarrhea.  He will return for a follow-up visit on 11/30/2017.  He will contact the office in the interim as outlined above or with any other problems.    25 minutes were spent face-to-face at today's visit with the majority of that time involved in counseling/coordination of care.  Plan reviewed with Dr. Benay Spice.    Ned Card ANP/GNP-BC   11/24/2017  11:23 AM

## 2017-11-25 ENCOUNTER — Ambulatory Visit
Admission: RE | Admit: 2017-11-25 | Discharge: 2017-11-25 | Disposition: A | Discharge status: Home / Self Care | Payer: Medicare Other | Source: Ambulatory Visit | Attending: Radiation Oncology | Admitting: Radiation Oncology

## 2017-11-25 DIAGNOSIS — C154 Malignant neoplasm of middle third of esophagus: Secondary | ICD-10-CM | POA: Diagnosis not present

## 2017-11-26 ENCOUNTER — Telehealth: Payer: Self-pay | Admitting: Emergency Medicine

## 2017-11-26 ENCOUNTER — Ambulatory Visit
Admission: RE | Admit: 2017-11-26 | Discharge: 2017-11-26 | Disposition: A | Discharge status: Home / Self Care | Payer: Medicare Other | Source: Ambulatory Visit | Attending: Radiation Oncology | Admitting: Radiation Oncology

## 2017-11-26 ENCOUNTER — Telehealth: Payer: Self-pay | Admitting: *Deleted

## 2017-11-26 ENCOUNTER — Other Ambulatory Visit: Payer: Self-pay | Admitting: *Deleted

## 2017-11-26 ENCOUNTER — Inpatient Hospital Stay: Payer: Medicare Other

## 2017-11-26 DIAGNOSIS — Z5111 Encounter for antineoplastic chemotherapy: Secondary | ICD-10-CM | POA: Diagnosis not present

## 2017-11-26 DIAGNOSIS — C159 Malignant neoplasm of esophagus, unspecified: Secondary | ICD-10-CM

## 2017-11-26 DIAGNOSIS — C154 Malignant neoplasm of middle third of esophagus: Secondary | ICD-10-CM | POA: Diagnosis not present

## 2017-11-26 LAB — CBC WITH DIFFERENTIAL (CANCER CENTER ONLY)
BASOS ABS: 0 10*3/uL (ref 0.0–0.1)
Basophils Relative: 2 %
Eosinophils Absolute: 0 10*3/uL (ref 0.0–0.5)
Eosinophils Relative: 2 %
HCT: 30.6 % — ABNORMAL LOW (ref 38.4–49.9)
HEMOGLOBIN: 10.6 g/dL — AB (ref 13.0–17.1)
LYMPHS ABS: 0.4 10*3/uL — AB (ref 0.9–3.3)
LYMPHS PCT: 35 %
MCH: 31.5 pg (ref 27.2–33.4)
MCHC: 34.6 g/dL (ref 32.0–36.0)
MCV: 91.1 fL (ref 79.3–98.0)
Monocytes Absolute: 0.5 10*3/uL (ref 0.1–0.9)
Monocytes Relative: 40 %
NEUTROS ABS: 0.2 10*3/uL — AB (ref 1.5–6.5)
NEUTROS PCT: 21 %
Platelet Count: 99 10*3/uL — ABNORMAL LOW (ref 140–400)
RBC: 3.36 MIL/uL — AB (ref 4.20–5.82)
RDW: 13 % (ref 11.0–14.6)
WBC: 1.2 10*3/uL — AB (ref 4.0–10.3)

## 2017-11-26 NOTE — Telephone Encounter (Addendum)
He verbalized understanding. And states he no longer has diarrhea.   ----- Message from Curtis Shark, NP sent at 11/26/2017  2:09 PM EDT ----- Please instruct him to continue the antibiotic.  Call with fever.

## 2017-11-26 NOTE — Telephone Encounter (Signed)
Telephone call to patient to discuss labs from today. Patient needs to continue Cipro, return for labs and office visit on Monday. He will need to monitor his temp and call us with any fevers.

## 2017-11-27 ENCOUNTER — Ambulatory Visit
Admission: RE | Admit: 2017-11-27 | Discharge: 2017-11-27 | Disposition: A | Discharge status: Home / Self Care | Payer: Medicare Other | Source: Ambulatory Visit | Attending: Radiation Oncology | Admitting: Radiation Oncology

## 2017-11-27 DIAGNOSIS — C154 Malignant neoplasm of middle third of esophagus: Secondary | ICD-10-CM | POA: Diagnosis not present

## 2017-11-29 ENCOUNTER — Other Ambulatory Visit: Payer: Self-pay | Admitting: Oncology

## 2017-11-30 ENCOUNTER — Ambulatory Visit
Admission: RE | Admit: 2017-11-30 | Discharge: 2017-11-30 | Disposition: A | Discharge status: Home / Self Care | Payer: Medicare Other | Source: Ambulatory Visit | Attending: Radiation Oncology | Admitting: Radiation Oncology

## 2017-11-30 ENCOUNTER — Telehealth: Payer: Self-pay | Admitting: Oncology

## 2017-11-30 ENCOUNTER — Encounter: Payer: Self-pay | Admitting: Oncology

## 2017-11-30 ENCOUNTER — Inpatient Hospital Stay: Payer: Medicare Other

## 2017-11-30 ENCOUNTER — Inpatient Hospital Stay (HOSPITAL_BASED_OUTPATIENT_CLINIC_OR_DEPARTMENT_OTHER): Payer: Medicare Other | Admitting: Oncology

## 2017-11-30 VITALS — BP 110/68 | HR 71 | Temp 98.0°F | Resp 18 | Ht 66.0 in | Wt 182.9 lb

## 2017-11-30 DIAGNOSIS — Z950 Presence of cardiac pacemaker: Secondary | ICD-10-CM

## 2017-11-30 DIAGNOSIS — J449 Chronic obstructive pulmonary disease, unspecified: Secondary | ICD-10-CM

## 2017-11-30 DIAGNOSIS — D6959 Other secondary thrombocytopenia: Secondary | ICD-10-CM

## 2017-11-30 DIAGNOSIS — R1310 Dysphagia, unspecified: Secondary | ICD-10-CM

## 2017-11-30 DIAGNOSIS — I739 Peripheral vascular disease, unspecified: Secondary | ICD-10-CM

## 2017-11-30 DIAGNOSIS — D701 Agranulocytosis secondary to cancer chemotherapy: Secondary | ICD-10-CM

## 2017-11-30 DIAGNOSIS — I2510 Atherosclerotic heart disease of native coronary artery without angina pectoris: Secondary | ICD-10-CM

## 2017-11-30 DIAGNOSIS — C154 Malignant neoplasm of middle third of esophagus: Secondary | ICD-10-CM | POA: Diagnosis not present

## 2017-11-30 DIAGNOSIS — Z8546 Personal history of malignant neoplasm of prostate: Secondary | ICD-10-CM

## 2017-11-30 DIAGNOSIS — Z23 Encounter for immunization: Secondary | ICD-10-CM

## 2017-11-30 DIAGNOSIS — C159 Malignant neoplasm of esophagus, unspecified: Secondary | ICD-10-CM

## 2017-11-30 DIAGNOSIS — Z5111 Encounter for antineoplastic chemotherapy: Secondary | ICD-10-CM | POA: Diagnosis not present

## 2017-11-30 LAB — CBC WITH DIFFERENTIAL (CANCER CENTER ONLY)
Basophils Absolute: 0 10*3/uL (ref 0.0–0.1)
Basophils Relative: 2 %
EOS ABS: 0.1 10*3/uL (ref 0.0–0.5)
EOS PCT: 4 %
HCT: 28.8 % — ABNORMAL LOW (ref 38.4–49.9)
Hemoglobin: 9.9 g/dL — ABNORMAL LOW (ref 13.0–17.1)
LYMPHS ABS: 0.3 10*3/uL — AB (ref 0.9–3.3)
Lymphocytes Relative: 24 %
MCH: 31.8 pg (ref 27.2–33.4)
MCHC: 34.4 g/dL (ref 32.0–36.0)
MCV: 92.6 fL (ref 79.3–98.0)
MONOS PCT: 31 %
Monocytes Absolute: 0.4 10*3/uL (ref 0.1–0.9)
Neutro Abs: 0.5 10*3/uL — ABNORMAL LOW (ref 1.5–6.5)
Neutrophils Relative %: 39 %
PLATELETS: 91 10*3/uL — AB (ref 140–400)
RBC: 3.11 MIL/uL — ABNORMAL LOW (ref 4.20–5.82)
RDW: 13.8 % (ref 11.0–14.6)
WBC Count: 1.4 10*3/uL — ABNORMAL LOW (ref 4.0–10.3)

## 2017-11-30 LAB — CMP (CANCER CENTER ONLY)
ALT: 19 U/L (ref 0–44)
AST: 28 U/L (ref 15–41)
Albumin: 2.8 g/dL — ABNORMAL LOW (ref 3.5–5.0)
Alkaline Phosphatase: 98 U/L (ref 38–126)
Anion gap: 7 (ref 5–15)
BUN: 12 mg/dL (ref 8–23)
CALCIUM: 8.8 mg/dL — AB (ref 8.9–10.3)
CO2: 27 mmol/L (ref 22–32)
Chloride: 106 mmol/L (ref 98–111)
Creatinine: 0.87 mg/dL (ref 0.61–1.24)
GFR, Estimated: >60 mL/min (ref >60)
Glucose, Bld: 124 mg/dL — ABNORMAL HIGH (ref 70–99)
Potassium: 3.6 mmol/L (ref 3.5–5.1)
SODIUM: 140 mmol/L (ref 135–145)
Total Bilirubin: 0.8 mg/dL (ref 0.3–1.2)
Total Protein: 6 g/dL — ABNORMAL LOW (ref 6.5–8.1)

## 2017-11-30 MED ORDER — PANTOPRAZOLE SODIUM 40 MG PO TBEC: 40.0000 mg | DELAYED_RELEASE_TABLET | Freq: Every day | ORAL | 3 refills | Status: DC

## 2017-11-30 MED ORDER — INFLUENZA VAC SPLIT QUAD 0.5 ML IM SUSY
0.5000 mL | PREFILLED_SYRINGE | Freq: Once | INTRAMUSCULAR | Status: AC
  Administered 2017-11-30: 0.5 mL via INTRAMUSCULAR

## 2017-11-30 MED ORDER — INFLUENZA VAC SPLIT QUAD 0.5 ML IM SUSY
PREFILLED_SYRINGE | INTRAMUSCULAR | Status: AC
  Filled 2017-11-30: qty 0.5

## 2017-11-30 NOTE — Progress Notes (Signed)
  [name]   Diagnosis: Esophagus cancer  INTERVAL HISTORY:   [name] is as scheduled.  He continues radiation.  He reports mild odynophagia.  He has an improved energy level and appetite while off of chemotherapy for the past 2 weeks. Chemotherapy was held on 11/25/2015 secondary to neutropenia and thrombocytopenia.  He denies fever and bleeding.  Chest pain he experienced prior to the esophagus cancer diagnosis has improved.  He was placed on prophylactic ciprofloxacin last week secondary to profound neutropenia.  Objective:  Vital signs in last 24 hours:  Blood pressure 110/68, pulse 71, temperature 98 F (36.7 C), temperature source Oral, resp. rate 18, height 5\' 6"  (1.676 m), weight 182 lb 14.4 oz (83 kg), SpO2 98 %.    HEENT: No thrush or ulcers Resp: Distant breath sounds, no respiratory distress Cardio: Regular rate and rhythm GI: No hepatomegaly, nontender Vascular: No leg edema   Lab Results:  Lab Results  Component Value Date   WBC 1.4 (L) 11/30/2017   HGB 9.9 (L) 11/30/2017   HCT 28.8 (L) 11/30/2017   MCV 92.6 11/30/2017   PLT 91 (L) 11/30/2017   NEUTROABS 0.5 (L) 11/30/2017    CMP  Lab Results  Component Value Date   NA 140 11/30/2017   K 3.6 11/30/2017   CL 106 11/30/2017   CO2 27 11/30/2017   GLUCOSE 124 (H) 11/30/2017   BUN 12 11/30/2017   CREATININE 0.87 11/30/2017   CALCIUM 8.8 (L) 11/30/2017   PROT 6.0 (L) 11/30/2017   ALBUMIN 2.8 (L) 11/30/2017   AST 28 11/30/2017   ALT 19 11/30/2017   ALKPHOS 98 11/30/2017   BILITOT 0.8 11/30/2017   GFRNONAA >60 11/30/2017   GFRAA >60 11/30/2017     Medications: I have reviewed the patient's current medications.   Assessment/Plan: 1. 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 Staging CTs 10/26/2017-asymmetric esophageal thickening, no evidence of metastatic disease ? Endoscopic biopsy 10/20/2017-mid esophagus partially obstructing mass,  biopsy confirmed squamous cell carcinoma ? PET scan 11/02/2017-wall thickening in the mid esophagus SUV 27 MA: No findings specific for metastatic disease ? Radiation beginning 11/09/2017 ? Cycle 1 weekly Taxol/carboplatin 11/10/2017 ? Cycle 2 weekly Taxol/carboplatin 11/17/2017 (carboplatin held due to thrombocytopenia) 2. Solid dysphagia and odynophagia secondary to #1 3. Coronary artery disease 4. History of prostate cancer 5. COPD 6. Permanent cardiac pacemaker 7. Peripheral vascular disease 8. Chronic thrombocytopenia 9. Neutropenia thrombocytopenia secondary to chemotherapy.  And probable underlying cirrhosis    Disposition: [name] appears stable.  He has completed 2 treatments weekly Taxol/carboplatin and concurrent radiation.  Chemotherapy was held last week secondary to neutropenia/thrombus cytopenia.  The white count and platelets remain low.  I suspect this is in part secondary to underlying cirrhosis.  Chemotherapy will be held again this week.  He will return for an office visit 12/07/2017 with the plan to deliver the next cycle of Taxol/carboplatin chemotherapy on 12/08/2017.  The Taxol and carboplatin will be dose reduced.  He will contact us for a fever or bleeding.  He will discontinue ciprofloxacin.  Betsy Coder, MD  11/30/2017  11:30 AM

## 2017-11-30 NOTE — Telephone Encounter (Signed)
Scheduled appt per 9/16 los - unable to schedule treatment and f/u due to overbook - logged - will call pt when all appts are scheduled.

## 2017-12-01 ENCOUNTER — Inpatient Hospital Stay: Payer: Medicare Other

## 2017-12-01 ENCOUNTER — Telehealth: Payer: Self-pay | Admitting: Cardiology

## 2017-12-01 ENCOUNTER — Encounter: Payer: Medicare Other | Admitting: *Deleted

## 2017-12-01 ENCOUNTER — Ambulatory Visit
Admission: RE | Admit: 2017-12-01 | Discharge: 2017-12-01 | Disposition: A | Discharge status: Home / Self Care | Payer: Medicare Other | Source: Ambulatory Visit | Attending: Radiation Oncology | Admitting: Radiation Oncology

## 2017-12-01 DIAGNOSIS — C154 Malignant neoplasm of middle third of esophagus: Secondary | ICD-10-CM | POA: Diagnosis not present

## 2017-12-01 NOTE — Telephone Encounter (Signed)
Spoke with pt and reminded pt of remote transmission that is due today. Pt verbalized understanding.   

## 2017-12-01 NOTE — Progress Notes (Signed)
Nutrition  Patient was a no show for nutrition follow-up appointment today following radiation.  Otho Michalik B. Zenia Resides, [place], [place] Registered Dietitian [phone] (pager)

## 2017-12-02 ENCOUNTER — Encounter: Payer: Self-pay | Admitting: Cardiology

## 2017-12-02 ENCOUNTER — Ambulatory Visit
Admission: RE | Admit: 2017-12-02 | Discharge: 2017-12-02 | Disposition: A | Discharge status: Home / Self Care | Payer: Medicare Other | Source: Ambulatory Visit | Attending: Radiation Oncology | Admitting: Radiation Oncology

## 2017-12-02 DIAGNOSIS — C154 Malignant neoplasm of middle third of esophagus: Secondary | ICD-10-CM | POA: Diagnosis not present

## 2017-12-03 ENCOUNTER — Ambulatory Visit
Admission: RE | Admit: 2017-12-03 | Discharge: 2017-12-03 | Disposition: A | Discharge status: Home / Self Care | Payer: Medicare Other | Source: Ambulatory Visit | Attending: Radiation Oncology | Admitting: Radiation Oncology

## 2017-12-03 DIAGNOSIS — C154 Malignant neoplasm of middle third of esophagus: Secondary | ICD-10-CM | POA: Diagnosis not present

## 2017-12-04 ENCOUNTER — Ambulatory Visit
Admission: RE | Admit: 2017-12-04 | Discharge: 2017-12-04 | Disposition: A | Discharge status: Home / Self Care | Payer: Medicare Other | Source: Ambulatory Visit | Attending: Radiation Oncology | Admitting: Radiation Oncology

## 2017-12-04 ENCOUNTER — Ambulatory Visit (INDEPENDENT_AMBULATORY_CARE_PROVIDER_SITE_OTHER): Payer: Medicare Other | Admitting: *Deleted

## 2017-12-04 DIAGNOSIS — C154 Malignant neoplasm of middle third of esophagus: Secondary | ICD-10-CM | POA: Diagnosis not present

## 2017-12-04 DIAGNOSIS — I495 Sick sinus syndrome: Secondary | ICD-10-CM

## 2017-12-07 ENCOUNTER — Other Ambulatory Visit: Payer: Self-pay | Admitting: Nurse Practitioner

## 2017-12-07 ENCOUNTER — Telehealth: Payer: Self-pay | Admitting: Nurse Practitioner

## 2017-12-07 ENCOUNTER — Inpatient Hospital Stay: Payer: Medicare Other | Admitting: Nurse Practitioner

## 2017-12-07 ENCOUNTER — Inpatient Hospital Stay: Payer: Medicare Other

## 2017-12-07 ENCOUNTER — Ambulatory Visit (INDEPENDENT_AMBULATORY_CARE_PROVIDER_SITE_OTHER): Payer: Medicare Other | Admitting: Cardiology

## 2017-12-07 ENCOUNTER — Ambulatory Visit
Admission: RE | Admit: 2017-12-07 | Discharge: 2017-12-07 | Disposition: A | Discharge status: Home / Self Care | Payer: Medicare Other | Source: Ambulatory Visit | Attending: Radiation Oncology | Admitting: Radiation Oncology

## 2017-12-07 ENCOUNTER — Encounter: Payer: Self-pay | Admitting: Cardiology

## 2017-12-07 VITALS — BP 110/64 | HR 81 | Ht 66.0 in | Wt 183.8 lb

## 2017-12-07 DIAGNOSIS — I10 Essential (primary) hypertension: Secondary | ICD-10-CM | POA: Diagnosis not present

## 2017-12-07 DIAGNOSIS — G4733 Obstructive sleep apnea (adult) (pediatric): Secondary | ICD-10-CM

## 2017-12-07 DIAGNOSIS — E669 Obesity, unspecified: Secondary | ICD-10-CM

## 2017-12-07 DIAGNOSIS — C159 Malignant neoplasm of esophagus, unspecified: Secondary | ICD-10-CM

## 2017-12-07 DIAGNOSIS — Z5111 Encounter for antineoplastic chemotherapy: Secondary | ICD-10-CM | POA: Diagnosis not present

## 2017-12-07 DIAGNOSIS — C154 Malignant neoplasm of middle third of esophagus: Secondary | ICD-10-CM | POA: Diagnosis not present

## 2017-12-07 LAB — CBC WITH DIFFERENTIAL (CANCER CENTER ONLY)
Basophils Absolute: 0 10*3/uL (ref 0.0–0.1)
Basophils Relative: 0 %
EOS ABS: 0 10*3/uL (ref 0.0–0.5)
EOS PCT: 2 %
HCT: 30.6 % — ABNORMAL LOW (ref 38.4–49.9)
Hemoglobin: 10.1 g/dL — ABNORMAL LOW (ref 13.0–17.1)
LYMPHS ABS: 0.4 10*3/uL — AB (ref 0.9–3.3)
LYMPHS PCT: 14 %
MCH: 31.3 pg (ref 27.2–33.4)
MCHC: 33 g/dL (ref 32.0–36.0)
MCV: 94.7 fL (ref 79.3–98.0)
Monocytes Absolute: 0.6 10*3/uL (ref 0.1–0.9)
Monocytes Relative: 25 %
Neutro Abs: 1.5 10*3/uL (ref 1.5–6.5)
Neutrophils Relative %: 59 %
PLATELETS: 88 10*3/uL — AB (ref 140–400)
RBC: 3.23 MIL/uL — AB (ref 4.20–5.82)
RDW: 14.2 % (ref 11.0–14.6)
WBC Count: 2.5 10*3/uL — ABNORMAL LOW (ref 4.0–10.3)

## 2017-12-07 LAB — CMP (CANCER CENTER ONLY)
ALBUMIN: 3 g/dL — AB (ref 3.5–5.0)
ALT: 14 U/L (ref 0–44)
AST: 28 U/L (ref 15–41)
Alkaline Phosphatase: 99 U/L (ref 38–126)
Anion gap: 5 (ref 5–15)
BUN: 15 mg/dL (ref 8–23)
CALCIUM: 8.9 mg/dL (ref 8.9–10.3)
CHLORIDE: 108 mmol/L (ref 98–111)
CO2: 28 mmol/L (ref 22–32)
CREATININE: 0.8 mg/dL (ref 0.61–1.24)
GFR, Est AFR Am: >60 mL/min (ref >60)
GFR, Estimated: >60 mL/min (ref >60)
GLUCOSE: 82 mg/dL (ref 70–99)
Potassium: 4.8 mmol/L (ref 3.5–5.1)
SODIUM: 141 mmol/L (ref 135–145)
Total Bilirubin: 0.5 mg/dL (ref 0.3–1.2)
Total Protein: 6.3 g/dL — ABNORMAL LOW (ref 6.5–8.1)

## 2017-12-07 NOTE — Patient Instructions (Signed)
Medication Instructions:  Your physician recommends that you continue on your current medications as directed. Please refer to the Current Medication list given to you today.  Follow-Up: Your physician recommends that you schedule a follow-up appointment in: 10 weeks with Dr. Radford Pax, Sleep  Your physician recommends that you schedule a follow-up appointment in: As soon as possible appointment with Dr. Burt Knack, worsening SOB  Your new Resmed BIPAP will be ordered today by our sleep coordinator, she will be in contact with you about any future sleep device questions.   If you need a refill on your cardiac medications before your next appointment, please call your pharmacy.

## 2017-12-07 NOTE — Progress Notes (Signed)
Cardiology Office Note:    Date:  12/07/2017   ID:  [name], DOB 1934/10/07, MRN [id number]  PCP:  [name], MD  Cardiologist:  Dr. [name] and Dr. Sherren Mocha  Referring MD: [name], *   Chief Complaint  Patient presents with  . Sleep Apnea  . Hypertension    History of Present Illness:    [name] is a 83 y.o. male with a hx of severe OSA with an AHI of 38/hr and is on  BiPAP at 16/12cm H2O. He tolerates his BiPAP but would like to have a new unit.[name] He tolerates the mask and feels the pressure is adequate.  Since going on PAP he feels rested in the am and has no significant daytime sleepiness.  He denies any significant mouth or nasal dryness or nasal congestion.  He does not think that he snores.  He has been having problems with awakening feeling short of breath recently.  He said he would like to know if he can have oxygen at night.  He was diagnosed with esophageal cancer and has been having a lot of issues with that.  He says that recently he has been awakening at night feeling like he cannot catch his breath.  He is also noted this during the day when he is exerting himself.  He does have a history of significant aortic stenosis and is actually in work-up for TAVR.  He had a heart catheterization done recently and underwent PCI of the ostial to proximal RCA with rotational atherectomy and DES.   Past Medical History:  Diagnosis Date  . AAA (abdominal aortic aneurysm) ([place])    a. Last duplex - 3.8cm 01/2013 - due 01/2014.  [name] Acne rosacea   . Alcoholic hepatitis with ascites 08/28/2017  . Anginal pain ([place]) 1996  . Aortic stenosis, mild    Last echo 05/04/12 +LVH  . Arthritis    "shoulders" (09/01/2017)  . Back pain    hx epidural injections  . Baker's cyst    Lt.  [name] CAD (coronary artery disease)    a. s/p MI/PTCA - Cx 1994. b. s/p CABG x5 in 1997. c. Abnl nuc 2003- occ SVG-RCA, patent seq SVG-OM1-dCxOM, patentl LIMA-LAD, patent,  SVG-diag. d. Normal nuc 09/2012.  . Carotid artery disease ([place])    a. Duplex 01/2013: mildly abnormal. Mild plaque without significant diameter reduction. Repeat recommended 11/15.  [name] COPD (chronic obstructive pulmonary disease) ([place])   . Dizziness and giddiness 08/04/2013  . GERD (gastroesophageal reflux disease)   . H/O: GI bleed    mild, neg. colonoscopy  . Heart murmur   . HOH (hard of hearing)   . HTN (hypertension)   . Hyperlipidemia   . Hypertensive heart disease   . Myocardial infarction ([place]) 1994  . OSA on CPAP   . OSA treated with BiPAP 11/20/2015   Severe with [facility] 38/hr  . PAF (paroxysmal atrial fibrillation) ([place])    a. Remote per records.   . Pneumonia    history  . Presence of permanent cardiac pacemaker    DOI 1999 with change out 2006; St Jude  . Prostate cancer ([place]) ~ 2010   a. s/p radical retropubic prostatectomy with bilateral pelvic lymph node dissection  . PVD (peripheral vascular disease) ([place])    a. Rt ext iliac stenosis, last PV cath 2003, moderate stenosis last Lower Ext Dopplers 12/16/11 - Rt. ABI 0.96  Lt ABI 1.0.  . Sick sinus syndrome ([place])  a. placed 1999, gen change 2011 - St. Jude.  . Sinus node dysfunction ([place]) 01/20/2013  . Syncope 11/28/97  . Thrombocytopenia ([facility])    chronic, mild  . Valvular heart disease    a. Echo 04/2012: mild-mod AS, mild AI, mild MR.    Past Surgical History:  Procedure Laterality Date  . APPENDECTOMY    . BIOPSY  10/20/2017   Procedure: BIOPSY;  Surgeon: Doran Stabler, MD;  Location: [facility];  Service: Gastroenterology;;  . CARDIAC CATHETERIZATION  2003   VG to RCA occluded, other grafts patent  . CARDIAC CATHETERIZATION N/A 05/04/2015   Procedure: Right/Left Heart Cath and Coronary/Graft Angiography;  Surgeon: Sherren Mocha, MD;  Location: [place] [facility];  Service: Cardiovascular;  Laterality: N/A;  . CARDIAC CATHETERIZATION  09/01/2017  . CARDIAC VALVE REPLACEMENT    . CATARACT EXTRACTION W/  INTRAOCULAR LENS  IMPLANT, BILATERAL Bilateral   . COLONOSCOPY    . CORONARY ANGIOPLASTY  03/29/92   PTCA to LCX  . CORONARY ARTERY BYPASS GRAFT  1997   LIMA-LAD; VG-diag; seq VG-1st OM & distal LCX; VG-RCA  . CORONARY ATHERECTOMY N/A 09/01/2017   Procedure: CORONARY ATHERECTOMY;  Surgeon: Jettie Booze, MD;  Location: [place] [facility];  Service: Cardiovascular;  Laterality: N/A;  . CORONARY ATHERECTOMY  10/01/2017  . CORONARY ATHERECTOMY N/A 10/01/2017   Procedure: CORONARY ATHERECTOMY;  Surgeon: Leonie Man, MD;  Location: [place] [facility];  Service: Cardiovascular;  Laterality: N/A;  . ESOPHAGOGASTRODUODENOSCOPY (EGD) WITH PROPOFOL N/A 10/20/2017   Procedure: ESOPHAGOGASTRODUODENOSCOPY (EGD) WITH PROPOFOL;  Surgeon: Doran Stabler, MD;  Location: [facility];  Service: Gastroenterology;  Laterality: N/A;  . INSERT / REPLACE / REMOVE PACEMAKER  11/28/1997   pacesetter--ERI 2011  . LEFT HEART CATH N/A 09/01/2017   Procedure: Left Heart Cath;  Surgeon: Jettie Booze, MD;  Location: [place] [facility];  Service: Cardiovascular;  Laterality: N/A;  . LEFT HEART CATH AND CORS/GRAFTS ANGIOGRAPHY N/A 08/28/2017   Procedure: LEFT HEART CATH AND CORS/GRAFTS ANGIOGRAPHY;  Surgeon: Leonie Man, MD;  Location: [place] [facility];  Service: Cardiovascular;  Laterality: N/A;  . PACEMAKER GENERATOR CHANGE  08/15/2009   St. Jude accent  . SUPRAPUBIC PROSTATECTOMY    . TEE WITHOUT CARDIOVERSION N/A 06/12/2015   Procedure: TRANSESOPHAGEAL ECHOCARDIOGRAM (TEE);  Surgeon: Sherren Mocha, MD;  Location: [place];  Service: Open Heart Surgery;  Laterality: N/A;  . TONSILLECTOMY    . TRANSCATHETER AORTIC VALVE REPLACEMENT, TRANSFEMORAL N/A 06/12/2015   Procedure: TRANSCATHETER AORTIC VALVE REPLACEMENT, TRANSFEMORAL, possible transpical;  Surgeon: Sherren Mocha, MD;  Location: [place];  Service: Open Heart Surgery;  Laterality: N/A;    Current Medications: Current Meds  Medication Sig  .  acetaminophen (TYLENOL) 325 MG tablet Take 650 mg by mouth 2 (two) times daily.  [name] aspirin EC 81 MG EC tablet Take 1 tablet (81 mg total) by mouth daily.  [name] atorvastatin (LIPITOR) 40 MG tablet Take 1 tablet (40 mg total) by mouth daily at 6 PM.  . clopidogrel (PLAVIX) 75 MG tablet Take 1 tablet (75 mg total) by mouth daily.  [name] HYDROcodone-acetaminophen (HYCET) 7.5-325 mg/15 ml solution Take 5 mLs by mouth 4 (four) times daily as needed for moderate pain.  . metoprolol tartrate (LOPRESSOR) 25 MG tablet TAKE 1 TABLET BY MOUTH TWICE DAILY  . nitroGLYCERIN (NITROSTAT) 0.4 MG SL tablet Place 1 tablet (0.4 mg total) under the tongue every 5 (five) minutes as needed for chest  pain.  . Omega-3 Fatty Acids (FISH OIL) 1200 MG CAPS Take 1,200 mg by mouth every evening.  . pantoprazole (PROTONIX) 40 MG tablet Take 1 tablet (40 mg total) by mouth daily.  [name] triamterene-hydrochlorothiazide (MAXZIDE-25) 37.5-25 MG tablet TAKE 1 TABLET BY MOUTH DAILY MONDAY THROUGH FRIDAY (Patient taking differently: Take 1 tablet by mouth daily. TAKE 1 TABLET BY MOUTH DAILY [place])  . vitamin C (ASCORBIC ACID) 500 MG tablet Take 500 mg by mouth every evening.     Allergies:   Patient has no known allergies.   Social History   Socioeconomic History  . Marital status: Widowed    Spouse name: Not on file  . Number of children: 5  . Years of education: 7th  . Highest education level: Not on file  Occupational History  . Occupation: Retired  Scientific laboratory technician  . Financial resource strain: Not on file  . Food insecurity:    Worry: Not on file    Inability: Not on file  . Transportation needs:    Medical: Not on file    Non-medical: Not on file  Tobacco Use  . Smoking status: Former Smoker    Packs/day: 2.00    Years: 48.00    Pack years: 96.00    Types: Cigarettes    Last attempt to quit: 03/18/1995    Years since quitting: 22.7  . Smokeless tobacco: Never Used  Substance and Sexual Activity  . Alcohol  use: No    Comment: 09/01/2017 "nothing in the last couple years"  . Drug use: Never  . Sexual activity: Not Currently  Lifestyle  . Physical activity:    Days per week: Not on file    Minutes per session: Not on file  . Stress: Not on file  Relationships  . Social connections:    Talks on phone: Not on file    Gets together: Not on file    Attends religious service: Not on file    Active member of club or organization: Not on file    Attends meetings of clubs or organizations: Not on file    Relationship status: Not on file  Other Topics Concern  . Not on file  Social History Narrative  . Not on file     Family History: [name] family history includes CAD in his unknown relative; Cancer in his brother; Colon cancer in his father; Heart Problems in his brother and sister.  ROS:   Please see the history of present illness.    ROS  All other systems reviewed and negative.   EKGs/Labs/Other Studies Reviewed:    The following studies were reviewed today: PAP download  EKG:  EKG is not ordered today.    Recent Labs: 08/25/2017: B Natriuretic Peptide 121.7 11/30/2017: ALT 19; BUN 12; Creatinine 0.87; Hemoglobin 9.9; Platelet Count 91; Potassium 3.6; Sodium 140   Recent Lipid Panel    Component Value Date/Time   CHOL 120 10/22/2017 1154   TRIG 144 10/22/2017 1154   HDL 43 10/22/2017 1154   CHOLHDL 2.8 10/22/2017 1154   CHOLHDL 3.3 04/26/2015 1620   VLDL 41 (H) 04/26/2015 1620   LDLCALC 48 10/22/2017 1154    Physical Exam:    VS:  BP 110/64   Pulse 81   Ht 5\' 6"  (1.676 m)   Wt 183 lb 12.8 oz (83.4 kg)   SpO2 95%   BMI 29.67 kg/m     Wt Readings from Last 3 Encounters:  12/07/17 183 lb  12.8 oz (83.4 kg)  11/30/17 182 lb 14.4 oz (83 kg)  11/24/17 181 lb 1.6 oz (82.1 kg)     GEN:  Well nourished, well developed in no acute distress HEENT: Normal NECK: No JVD; No carotid bruits LYMPHATICS: No lymphadenopathy CARDIAC: RRR, no murmurs, rubs,  gallops RESPIRATORY:  Clear to auscultation without rales, wheezing or rhonchi  ABDOMEN: Soft, non-tender, non-distended MUSCULOSKELETAL:  No edema; No deformity  SKIN: Warm and dry NEUROLOGIC:  Alert and oriented x 3 PSYCHIATRIC:  Normal affect   ASSESSMENT:    1. OSA treated with BiPAP   2. Essential hypertension   3. Obesity (BMI 30-39.9)    PLAN:    In order of problems listed above:  1.  OSA - the patient is tolerating PAP therapy well without any problems. The PAP download was reviewed today and showed an AHI of 0.6/hr on 16/12 cm H2O with 100% compliance in using more than 4 hours nightly.  The patient has been using and benefiting from PAP use and will continue to benefit from therapy.  He has been having a lot of shortness of breath during the day as well as at night and thinks he might need oxygen at night.  I do not think his PND and orthopnea are related to obstructive sleep apnea and that his AHI is essentially 0 on BiPAP and is very compliant using.  I suspect that his episodes of shortness of breath at night as well as during the day are related to his underlying aortic stenosis as well as CAD.  I am going to get him back into see Dr. Gerrit Halls who has been following him for TAVR work-up for his severe aortic stenosis.  I will get an overnight pulse oximetry on BiPAP to assess for nocturnal hypoxemia.  He also would like to have a new BiPAP unit which I will order.  He will see me back in 10 weeks per insurance requirements to document compliance with the new device.  2.  HTN -BP is well controlled on exam today.  He will continue on Lopressor 25 mg twice daily,  3.  Obesity - I have encouraged him to get into a routine exercise program and cut back on carbs and portions.    Medication Adjustments/Labs and Tests Ordered: Current medicines are reviewed at length with the patient today.  Concerns regarding medicines are outlined above.  No orders of the defined types were  placed in this encounter.  No orders of the defined types were placed in this encounter.   Signed, Fransico Him, MD  12/07/2017 11:40 AM    [employer]

## 2017-12-07 NOTE — Telephone Encounter (Signed)
Left message for patient with appt for 10/1 .

## 2017-12-08 ENCOUNTER — Encounter: Payer: Self-pay | Admitting: Cardiology

## 2017-12-08 ENCOUNTER — Other Ambulatory Visit: Payer: Self-pay

## 2017-12-08 ENCOUNTER — Inpatient Hospital Stay: Payer: Medicare Other

## 2017-12-08 ENCOUNTER — Other Ambulatory Visit: Payer: Self-pay | Admitting: Oncology

## 2017-12-08 ENCOUNTER — Ambulatory Visit
Admission: RE | Admit: 2017-12-08 | Discharge: 2017-12-08 | Disposition: A | Discharge status: Home / Self Care | Payer: Medicare Other | Source: Ambulatory Visit | Attending: Radiation Oncology | Admitting: Radiation Oncology

## 2017-12-08 ENCOUNTER — Inpatient Hospital Stay (HOSPITAL_BASED_OUTPATIENT_CLINIC_OR_DEPARTMENT_OTHER): Payer: Medicare Other | Admitting: Oncology

## 2017-12-08 VITALS — BP 143/77 | HR 96 | Temp 98.1°F | Resp 18

## 2017-12-08 DIAGNOSIS — I447 Left bundle-branch block, unspecified: Secondary | ICD-10-CM

## 2017-12-08 DIAGNOSIS — J449 Chronic obstructive pulmonary disease, unspecified: Secondary | ICD-10-CM | POA: Diagnosis not present

## 2017-12-08 DIAGNOSIS — D6959 Other secondary thrombocytopenia: Secondary | ICD-10-CM | POA: Diagnosis not present

## 2017-12-08 DIAGNOSIS — D701 Agranulocytosis secondary to cancer chemotherapy: Secondary | ICD-10-CM

## 2017-12-08 DIAGNOSIS — R0789 Other chest pain: Secondary | ICD-10-CM

## 2017-12-08 DIAGNOSIS — Z5111 Encounter for antineoplastic chemotherapy: Secondary | ICD-10-CM | POA: Diagnosis not present

## 2017-12-08 DIAGNOSIS — C154 Malignant neoplasm of middle third of esophagus: Secondary | ICD-10-CM | POA: Diagnosis not present

## 2017-12-08 DIAGNOSIS — R040 Epistaxis: Secondary | ICD-10-CM

## 2017-12-08 MED ORDER — SODIUM CHLORIDE 0.9 % IV SOLN
143.4000 mg | Freq: Once | INTRAVENOUS | Status: AC
  Administered 2017-12-08: 140 mg via INTRAVENOUS
  Filled 2017-12-08: qty 14

## 2017-12-08 MED ORDER — SODIUM CHLORIDE 0.9 % IV SOLN
35.0000 mg/m2 | Freq: Once | INTRAVENOUS | Status: AC
  Administered 2017-12-08: 72 mg via INTRAVENOUS
  Filled 2017-12-08: qty 12

## 2017-12-08 MED ORDER — PALONOSETRON HCL INJECTION 0.25 MG/5ML
0.2500 mg | Freq: Once | INTRAVENOUS | Status: AC
  Administered 2017-12-08: 0.25 mg via INTRAVENOUS

## 2017-12-08 MED ORDER — SODIUM CHLORIDE 0.9 % IV SOLN: 10.0000 mg | Freq: Once | INTRAVENOUS | Status: DC

## 2017-12-08 MED ORDER — FAMOTIDINE IN NACL 20-0.9 MG/50ML-% IV SOLN
INTRAVENOUS | Status: AC
  Filled 2017-12-08: qty 50

## 2017-12-08 MED ORDER — DEXAMETHASONE SODIUM PHOSPHATE 10 MG/ML IJ SOLN
INTRAMUSCULAR | Status: AC
  Filled 2017-12-08: qty 1

## 2017-12-08 MED ORDER — SODIUM CHLORIDE 0.9 % IV SOLN
Freq: Once | INTRAVENOUS | Status: AC
  Administered 2017-12-08: 14:00:00 via INTRAVENOUS
  Filled 2017-12-08: qty 250

## 2017-12-08 MED ORDER — DEXAMETHASONE SODIUM PHOSPHATE 10 MG/ML IJ SOLN
10.0000 mg | Freq: Once | INTRAMUSCULAR | Status: AC
  Administered 2017-12-08: 10 mg via INTRAVENOUS

## 2017-12-08 MED ORDER — FAMOTIDINE IN NACL 20-0.9 MG/50ML-% IV SOLN
20.0000 mg | Freq: Once | INTRAVENOUS | Status: AC
  Administered 2017-12-08: 20 mg via INTRAVENOUS

## 2017-12-08 MED ORDER — PALONOSETRON HCL INJECTION 0.25 MG/5ML
INTRAVENOUS | Status: AC
  Filled 2017-12-08: qty 5

## 2017-12-08 MED ORDER — DIPHENHYDRAMINE HCL 50 MG/ML IJ SOLN
25.0000 mg | Freq: Once | INTRAMUSCULAR | Status: AC
  Administered 2017-12-08: 25 mg via INTRAVENOUS

## 2017-12-08 MED ORDER — DIPHENHYDRAMINE HCL 50 MG/ML IJ SOLN
INTRAMUSCULAR | Status: AC
  Filled 2017-12-08: qty 1

## 2017-12-08 NOTE — Progress Notes (Signed)
Remote pacemaker transmission.   

## 2017-12-08 NOTE — Patient Instructions (Signed)
[facility] Discharge Instructions for Patients Receiving Chemotherapy  Today you received the following chemotherapy agents Taxol and carboplatin.  To help prevent nausea and vomiting after your treatment, we encourage you to take your nausea medication as directed by MD   If you develop nausea and vomiting that is not controlled by your nausea medication, call the clinic.   BELOW ARE SYMPTOMS THAT SHOULD BE REPORTED IMMEDIATELY:  *FEVER GREATER THAN 100.5 F  *CHILLS WITH OR WITHOUT FEVER  NAUSEA AND VOMITING THAT IS NOT CONTROLLED WITH YOUR NAUSEA MEDICATION  *UNUSUAL SHORTNESS OF BREATH  *UNUSUAL BRUISING OR BLEEDING  TENDERNESS IN MOUTH AND THROAT WITH OR WITHOUT PRESENCE OF ULCERS  *URINARY PROBLEMS  *BOWEL PROBLEMS  UNUSUAL RASH Items with * indicate a potential emergency and should be followed up as soon as possible.  Feel free to call the clinic should you have any questions or concerns. The clinic phone number is [phone].  Please show the [facility] at check-in to the Emergency Department and triage nurse.  Taxol- Paclitaxel injection What is this medicine? PACLITAXEL (PAK li TAX el) is a chemotherapy drug. It targets fast dividing cells, like cancer cells, and causes these cells to die. This medicine is used to treat ovarian cancer, breast cancer, and other cancers. This medicine may be used for other purposes; ask your health care provider or pharmacist if you have questions. COMMON BRAND NAME(S): Onxol, Taxol What should I tell my health care provider before I take this medicine? They need to know if you have any of these conditions: -blood disorders -irregular heartbeat -infection (especially a virus infection such as chickenpox, cold sores, or herpes) -liver disease -previous or ongoing radiation therapy -an unusual or allergic reaction to paclitaxel, alcohol, polyoxyethylated castor oil, other chemotherapy agents, other  medicines, foods, dyes, or preservatives -pregnant or trying to get pregnant -breast-feeding How should I use this medicine? This drug is given as an infusion into a vein. It is administered in a hospital or clinic by a specially trained health care professional. Talk to your pediatrician regarding the use of this medicine in children. Special care may be needed. Overdosage: If you think you have taken too much of this medicine contact a poison control center or emergency room at once. NOTE: This medicine is only for you. Do not share this medicine with others. What if I miss a dose? It is important not to miss your dose. Call your doctor or health care professional if you are unable to keep an appointment. What may interact with this medicine? Do not take this medicine with any of the following medications: -disulfiram -metronidazole This medicine may also interact with the following medications: -cyclosporine -diazepam -ketoconazole -medicines to increase blood counts like filgrastim, pegfilgrastim, sargramostim -other chemotherapy drugs like cisplatin, doxorubicin, epirubicin, etoposide, teniposide, vincristine -quinidine -testosterone -vaccines -verapamil Talk to your doctor or health care professional before taking any of these medicines: -acetaminophen -aspirin -ibuprofen -ketoprofen -naproxen This list may not describe all possible interactions. Give your health care provider a list of all the medicines, herbs, non-prescription drugs, or dietary supplements you use. Also tell them if you smoke, drink alcohol, or use illegal drugs. Some items may interact with your medicine. What should I watch for while using this medicine? Your condition will be monitored carefully while you are receiving this medicine. You will need important blood work done while you are taking this medicine. This medicine can cause serious allergic reactions. To reduce your risk  you will need to take other  medicine(s) before treatment with this medicine. If you experience allergic reactions like skin rash, itching or hives, swelling of the face, lips, or tongue, tell your doctor or health care professional right away. In some cases, you may be given additional medicines to help with side effects. Follow all directions for their use. This drug may make you feel generally unwell. This is not uncommon, as chemotherapy can affect healthy cells as well as cancer cells. Report any side effects. Continue your course of treatment even though you feel ill unless your doctor tells you to stop. Call your doctor or health care professional for advice if you get a fever, chills or sore throat, or other symptoms of a cold or flu. Do not treat yourself. This drug decreases your body's ability to fight infections. Try to avoid being around people who are sick. This medicine may increase your risk to bruise or bleed. Call your doctor or health care professional if you notice any unusual bleeding. Be careful brushing and flossing your teeth or using a toothpick because you may get an infection or bleed more easily. If you have any dental work done, tell your dentist you are receiving this medicine. Avoid taking products that contain aspirin, acetaminophen, ibuprofen, naproxen, or ketoprofen unless instructed by your doctor. These medicines may hide a fever. Do not become pregnant while taking this medicine. Women should inform their doctor if they wish to become pregnant or think they might be pregnant. There is a potential for serious side effects to an unborn child. Talk to your health care professional or pharmacist for more information. Do not breast-feed an infant while taking this medicine. Men are advised not to father a child while receiving this medicine. This product may contain alcohol. Ask your pharmacist or healthcare provider if this medicine contains alcohol. Be sure to tell all healthcare providers you are  taking this medicine. Certain medicines, like metronidazole and disulfiram, can cause an unpleasant reaction when taken with alcohol. The reaction includes flushing, headache, nausea, vomiting, sweating, and increased thirst. The reaction can last from 30 minutes to several hours. What side effects may I notice from receiving this medicine? Side effects that you should report to your doctor or health care professional as soon as possible: -allergic reactions like skin rash, itching or hives, swelling of the face, lips, or tongue -low blood counts - This drug may decrease the number of white blood cells, red blood cells and platelets. You may be at increased risk for infections and bleeding. -signs of infection - fever or chills, cough, sore throat, pain or difficulty passing urine -signs of decreased platelets or bleeding - bruising, pinpoint red spots on the skin, black, tarry stools, nosebleeds -signs of decreased red blood cells - unusually weak or tired, fainting spells, lightheadedness -breathing problems -chest pain -high or low blood pressure -mouth sores -nausea and vomiting -pain, swelling, redness or irritation at the injection site -pain, tingling, numbness in the hands or feet -slow or irregular heartbeat -swelling of the ankle, feet, hands Side effects that usually do not require medical attention (report to your doctor or health care professional if they continue or are bothersome): -bone pain -complete hair loss including hair on your head, underarms, pubic hair, eyebrows, and eyelashes -changes in the color of fingernails -diarrhea -loosening of the fingernails -loss of appetite -muscle or joint pain -red flush to skin -sweating This list may not describe all possible side effects. Call your doctor  for medical advice about side effects. You may report side effects to FDA at 1-800-FDA-1088. Where should I keep my medicine? This drug is given in a hospital or clinic and  will not be stored at home. NOTE: This sheet is a summary. It may not cover all possible information. If you have questions about this medicine, talk to your doctor, pharmacist, or health care provider.  2018 Elsevier/Gold Standard (2015-01-02 19:58:00)   Thrombocytopenia Thrombocytopenia means that you have a low number of platelets in your blood. Platelets are tiny cells in the blood. When you bleed, they clump together at the cut or injury to stop the bleeding. This is called blood clotting. Not having enough platelets can cause bleeding problems. Follow these instructions at home: General instructions  Check your skin and inside your mouth for bruises or blood as told by your doctor.  Check to see if there is blood in your spit (sputum), pee (urine), and poop (stool). Do this as told by your doctor.  Ask your doctor if you can drink alcohol.  Take over-the-counter and prescription medicines only as told by your doctor.  Tell all of your doctors that you have this condition. Be sure to tell your dentist and eye doctor too. Activity  Do not do activities that can cause bumps or bruises until your doctor says it is okay.  Be careful not to cut yourself: ? When you shave. ? When you use scissors, needles, knives, or other tools.  Be careful not to burn yourself: ? When you use an iron. ? When you cook. Contact a doctor if:  You have bruises and you do not know why. Get help right away if:  You are bleeding anywhere on your body.  You have blood in your spit, pee, or poop. This information is not intended to replace advice given to you by your health care provider. Make sure you discuss any questions you have with your health care provider. Document Released: 02/20/2011 Document Revised: 11/04/2015 Document Reviewed: 09/04/2014 Elsevier Interactive Patient Education  [employer].

## 2017-12-08 NOTE — Progress Notes (Signed)
[name] OFFICE PROGRESS NOTE   Diagnosis: Esophagus cancer  INTERVAL HISTORY:   [name] is seen in the infusion room today prior to chemo.  He was due to be seen yesterday but missed his appointment.  Today he denies fevers and chills.  Denies chest pain, shortness of breath, cough, hemoptysis.  Denies nausea, vomiting, constipation, diarrhea.  Denies odynophagia.  His chemotherapy has been held for the past 2 weeks secondary to thrombocytopenia and neutropenia.  Has mild epistaxis consistent with his baseline but no other bleeding.  Objective:  Vital signs in last 24 hours:  Temperature 98.1, pulse 96, blood pressure 143/77, respirations 18, O2 sat 98% on room air   HEENT: No thrush or ulcers Resp: Distant breath sounds, no respiratory distress Cardio: Regular rate and rhythm GI: No hepatomegaly, nontender Vascular: No leg edema   Lab Results:  Lab Results  Component Value Date   WBC 2.5 (L) 12/07/2017   HGB 10.1 (L) 12/07/2017   HCT 30.6 (L) 12/07/2017   MCV 94.7 12/07/2017   PLT 88 (L) 12/07/2017   NEUTROABS 1.5 12/07/2017    CMP  Lab Results  Component Value Date   NA 141 12/07/2017   K 4.8 12/07/2017   CL 108 12/07/2017   CO2 28 12/07/2017   GLUCOSE 82 12/07/2017   BUN 15 12/07/2017   CREATININE 0.80 12/07/2017   CALCIUM 8.9 12/07/2017   PROT 6.3 (L) 12/07/2017   ALBUMIN 3.0 (L) 12/07/2017   AST 28 12/07/2017   ALT 14 12/07/2017   ALKPHOS 99 12/07/2017   BILITOT 0.5 12/07/2017   GFRNONAA >60 12/07/2017   GFRAA >60 12/07/2017     Medications: I have reviewed the patient's current medications.   Assessment/Plan: 1. 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 Staging CTs 10/26/2017-asymmetric esophageal thickening, no evidence of metastatic disease ? Endoscopic biopsy 10/20/2017-mid esophagus partially obstructing mass, biopsy confirmed squamous cell carcinoma ? PET scan 11/02/2017-wall thickening in the mid  esophagus SUV 27 MA: No findings specific for metastatic disease ? Radiation beginning 11/09/2017 ? Cycle 1 weekly Taxol/carboplatin 11/10/2017 ? Cycle 2 weekly Taxol/carboplatin 11/17/2017 (carboplatin held due to thrombocytopenia) ? Cycle 3 weekly Taxol/carboplatin 12/08/2017 -carboplatin dose was reduced to an AUC of 1.5 and Taxol dose was reduced to 35 mg/m 2. Solid dysphagia and odynophagia secondary to #1 3. Coronary artery disease 4. History of prostate cancer 5. COPD 6. Permanent cardiac pacemaker 7. Peripheral vascular disease 8. Chronic thrombocytopenia 9. Neutropenia thrombocytopenia secondary to chemotherapy.  And probable underlying cirrhosis  Disposition: [name] appears stable.  He has completed 2 treatments weekly Taxol/carboplatin and concurrent radiation.  Chemotherapy has been held the past 2 weeks secondary to neutropenia and thrombocytopenia.  CBC has been reviewed.  His ANC is 1.5 and the platelet count is 88,000.  His platelets are stable.  I have reviewed his blood work with Dr. Earlie Server and Dr. Gearldine Shown absence.  Counts are adequate to proceed with treatment.  I have advised the patient to call us if he develops any fevers, chills, or any bleeding.  The patient will keep his follow-up as previously scheduled on 12/15/2017.  ADDENDUM: I was asked to come see the patient in chemotherapy again.  The patient was given premedications including dexamethasone 10 mg, Benadryl 25 mg, Pepcid 20 mg, and Aloxi 0.25 mg.  After administration of premedications, the patient reports that his heart feels "woozy".  He feels a sensation in the center of his chest.  He also feels like his heart is "floating."  He denies having chest pain.  He denies having shortness of breath.  An EKG was obtained.  Ventricular rate was 65.  He is a normal sinus rhythm with left bundle branch block.  After approximately 10 to 15 minutes, the patient sensation in his heart has resolved.  I recommended that  he proceed with treatment today as scheduled.  He is agreeable to proceed.  Curtis Bussing, NP  12/08/2017  3:47 PM

## 2017-12-08 NOTE — Progress Notes (Signed)
Nutrition Follow-up:  Patient with esophageal cancer.  Patient undergoing chemotherapy and radiation therapy.  Planning to receive chemotherapy today, treatment has been on hold recently.  Met with patient during infusion.  Patient reports that appetite is fair.  Typically eats 2 meals per day and drink 1-2 slimfast/equate shakes per day.  Reports that he can eat most consistency foods. Reports that he ate eggs and grits yesterday and vegetable soup and drank 2 shakes.  Likes banana and peanut butter sandwiches and peanut butter and jelly sandwiches.  Reports taste of food is off.    No other nutrition impact symptoms reported.    Medications: reviewed  Labs: reviewed  Anthropometrics:   Weight stable at 183 lb 12.8 oz today    NUTRITION DIAGNOSIS:  Inadequate oral intake stable   INTERVENTION:  Discussed strategies to help with taste changes. Encouraged intake of oral nutrition supplements Encouraged adding gravies, sauces to food for increased calories and moisture    MONITORING, EVALUATION, GOAL: weight trends, intake   NEXT VISIT: October 1 during infusion  Damian Hofstra B. Zenia Resides, [place], [place] Registered Dietitian [phone] (pager)

## 2017-12-08 NOTE — Progress Notes (Signed)
Patient seen in infusion today per the request of nursing.  He is due for his next cycle of weekly carboplatin and Taxol.  Treatment has been held the past 2 weeks due to neutropenia and thrombocytopenia.  Labs from today have been reviewed.  His total white count has come up to 2.5 with an [place] of 1.5.  His hemoglobin is 10.1 which is improved from last week.  His platelet count is 88,000 which is stable.    The patient reports that he feels well.  Denies fevers and chills.  Denies difficulty swallowing.  He reports an intermittent nosebleed which is consistent with his baseline.  He is on Plavix.  Labs have been reviewed with Dr. Julien Nordmann and Dr. Gearldine Shown absence.  Okay to proceed with treatment today as scheduled.  Orders have been reviewed and the carboplatin dose is already dose reduced to an AUC of 1.5 and the Taxol doses are been reduced to 35 mg meter squared.  I have advised the patient to contact us if he develops any bleeding or fevers.

## 2017-12-08 NOTE — Progress Notes (Signed)
Okay to treat -Per Dr Julien Nordmann it is okay to treat pt today wwith Taxol.Carboplatin and todays cbc/diff and cmp. 1522- "Feel weird like my heart is floating and feels woozy in the head".

## 2017-12-09 ENCOUNTER — Telehealth: Payer: Self-pay | Admitting: Internal Medicine

## 2017-12-09 ENCOUNTER — Ambulatory Visit
Admission: RE | Admit: 2017-12-09 | Discharge: 2017-12-09 | Disposition: A | Discharge status: Home / Self Care | Payer: Medicare Other | Source: Ambulatory Visit | Attending: Radiation Oncology | Admitting: Radiation Oncology

## 2017-12-09 DIAGNOSIS — C154 Malignant neoplasm of middle third of esophagus: Secondary | ICD-10-CM | POA: Diagnosis not present

## 2017-12-09 NOTE — Telephone Encounter (Signed)
° °  Patient states he sent transmission today  1. Has your device fired? NO  2. Is you device beeping? NO  3. Are you experiencing draining or swelling at device site? NO  4. Are you calling to see if we received your device transmission? YES  5. Have you passed out? NO    Please route to [place]

## 2017-12-09 NOTE — Telephone Encounter (Signed)
LMOM (per DPR) that transmission was received. [facility] number if he needs to call back.

## 2017-12-10 ENCOUNTER — Ambulatory Visit
Admission: RE | Admit: 2017-12-10 | Discharge: 2017-12-10 | Disposition: A | Discharge status: Home / Self Care | Payer: Medicare Other | Source: Ambulatory Visit | Attending: Radiation Oncology | Admitting: Radiation Oncology

## 2017-12-10 DIAGNOSIS — C154 Malignant neoplasm of middle third of esophagus: Secondary | ICD-10-CM | POA: Diagnosis not present

## 2017-12-11 ENCOUNTER — Ambulatory Visit
Admission: RE | Admit: 2017-12-11 | Discharge: 2017-12-11 | Disposition: A | Discharge status: Home / Self Care | Payer: Medicare Other | Source: Ambulatory Visit | Attending: Radiation Oncology | Admitting: Radiation Oncology

## 2017-12-11 DIAGNOSIS — C154 Malignant neoplasm of middle third of esophagus: Secondary | ICD-10-CM | POA: Diagnosis not present

## 2017-12-13 ENCOUNTER — Other Ambulatory Visit: Payer: Self-pay | Admitting: Oncology

## 2017-12-14 ENCOUNTER — Ambulatory Visit
Admission: RE | Admit: 2017-12-14 | Discharge: 2017-12-14 | Disposition: A | Discharge status: Home / Self Care | Payer: Medicare Other | Source: Ambulatory Visit | Attending: Radiation Oncology | Admitting: Radiation Oncology

## 2017-12-14 DIAGNOSIS — C154 Malignant neoplasm of middle third of esophagus: Secondary | ICD-10-CM | POA: Diagnosis not present

## 2017-12-15 ENCOUNTER — Encounter: Payer: Self-pay | Admitting: Nurse Practitioner

## 2017-12-15 ENCOUNTER — Inpatient Hospital Stay (HOSPITAL_BASED_OUTPATIENT_CLINIC_OR_DEPARTMENT_OTHER): Payer: Medicare Other | Admitting: Nurse Practitioner

## 2017-12-15 ENCOUNTER — Other Ambulatory Visit: Payer: Self-pay | Admitting: Emergency Medicine

## 2017-12-15 ENCOUNTER — Ambulatory Visit
Admission: RE | Admit: 2017-12-15 | Discharge: 2017-12-15 | Disposition: A | Discharge status: Home / Self Care | Payer: Medicare Other | Source: Ambulatory Visit | Attending: Radiation Oncology | Admitting: Radiation Oncology

## 2017-12-15 ENCOUNTER — Inpatient Hospital Stay: Payer: Medicare Other

## 2017-12-15 ENCOUNTER — Inpatient Hospital Stay: Payer: Medicare Other | Attending: Oncology

## 2017-12-15 ENCOUNTER — Telehealth: Payer: Self-pay

## 2017-12-15 VITALS — BP 111/55 | HR 73 | Temp 98.5°F | Resp 18 | Ht 66.0 in | Wt 178.6 lb

## 2017-12-15 DIAGNOSIS — D701 Agranulocytosis secondary to cancer chemotherapy: Secondary | ICD-10-CM | POA: Diagnosis not present

## 2017-12-15 DIAGNOSIS — C154 Malignant neoplasm of middle third of esophagus: Secondary | ICD-10-CM

## 2017-12-15 DIAGNOSIS — I739 Peripheral vascular disease, unspecified: Secondary | ICD-10-CM

## 2017-12-15 DIAGNOSIS — I2510 Atherosclerotic heart disease of native coronary artery without angina pectoris: Secondary | ICD-10-CM | POA: Insufficient documentation

## 2017-12-15 DIAGNOSIS — D6959 Other secondary thrombocytopenia: Secondary | ICD-10-CM | POA: Diagnosis not present

## 2017-12-15 DIAGNOSIS — Z5111 Encounter for antineoplastic chemotherapy: Secondary | ICD-10-CM | POA: Insufficient documentation

## 2017-12-15 DIAGNOSIS — J449 Chronic obstructive pulmonary disease, unspecified: Secondary | ICD-10-CM | POA: Insufficient documentation

## 2017-12-15 DIAGNOSIS — Z8546 Personal history of malignant neoplasm of prostate: Secondary | ICD-10-CM | POA: Diagnosis not present

## 2017-12-15 DIAGNOSIS — Z510 Encounter for antineoplastic radiation therapy: Secondary | ICD-10-CM | POA: Insufficient documentation

## 2017-12-15 DIAGNOSIS — C159 Malignant neoplasm of esophagus, unspecified: Secondary | ICD-10-CM

## 2017-12-15 DIAGNOSIS — I498 Other specified cardiac arrhythmias: Secondary | ICD-10-CM

## 2017-12-15 LAB — CBC WITH DIFFERENTIAL (CANCER CENTER ONLY)
Basophils Absolute: 0 10*3/uL (ref 0.0–0.1)
Basophils Relative: 1 %
Eosinophils Absolute: 0.1 10*3/uL (ref 0.0–0.5)
Eosinophils Relative: 4 %
HEMATOCRIT: 32.2 % — AB (ref 38.4–49.9)
HEMOGLOBIN: 10.9 g/dL — AB (ref 13.0–17.1)
LYMPHS ABS: 0.2 10*3/uL — AB (ref 0.9–3.3)
LYMPHS PCT: 9 %
MCH: 31.5 pg (ref 27.2–33.4)
MCHC: 33.8 g/dL (ref 32.0–36.0)
MCV: 93.3 fL (ref 79.3–98.0)
MONOS PCT: 12 %
Monocytes Absolute: 0.2 10*3/uL (ref 0.1–0.9)
NEUTROS ABS: 1.4 10*3/uL — AB (ref 1.5–6.5)
NEUTROS PCT: 74 %
Platelet Count: 89 10*3/uL — ABNORMAL LOW (ref 140–400)
RBC: 3.45 MIL/uL — AB (ref 4.20–5.82)
RDW: 15 % — ABNORMAL HIGH (ref 11.0–14.6)
WBC: 1.8 10*3/uL — AB (ref 4.0–10.3)

## 2017-12-15 LAB — CMP (CANCER CENTER ONLY)
ALK PHOS: 120 U/L (ref 38–126)
ALT: 32 U/L (ref 0–44)
ANION GAP: 7 (ref 5–15)
AST: 40 U/L (ref 15–41)
Albumin: 3.1 g/dL — ABNORMAL LOW (ref 3.5–5.0)
BILIRUBIN TOTAL: 1.1 mg/dL (ref 0.3–1.2)
BUN: 12 mg/dL (ref 8–23)
CALCIUM: 8.8 mg/dL — AB (ref 8.9–10.3)
CO2: 25 mmol/L (ref 22–32)
CREATININE: 0.81 mg/dL (ref 0.61–1.24)
Chloride: 107 mmol/L (ref 98–111)
GFR, Estimated: >60 mL/min (ref >60)
GLUCOSE: 129 mg/dL — AB (ref 70–99)
Potassium: 4 mmol/L (ref 3.5–5.1)
Sodium: 139 mmol/L (ref 135–145)
TOTAL PROTEIN: 6.6 g/dL (ref 6.5–8.1)

## 2017-12-15 MED ORDER — FAMOTIDINE IN NACL 20-0.9 MG/50ML-% IV SOLN
INTRAVENOUS | Status: AC
  Filled 2017-12-15: qty 50

## 2017-12-15 MED ORDER — PALONOSETRON HCL INJECTION 0.25 MG/5ML
0.2500 mg | Freq: Once | INTRAVENOUS | Status: AC
  Administered 2017-12-15: 0.25 mg via INTRAVENOUS

## 2017-12-15 MED ORDER — FAMOTIDINE IN NACL 20-0.9 MG/50ML-% IV SOLN
20.0000 mg | Freq: Once | INTRAVENOUS | Status: AC
  Administered 2017-12-15: 20 mg via INTRAVENOUS

## 2017-12-15 MED ORDER — DEXAMETHASONE SODIUM PHOSPHATE 10 MG/ML IJ SOLN
INTRAMUSCULAR | Status: AC
  Filled 2017-12-15: qty 1

## 2017-12-15 MED ORDER — SODIUM CHLORIDE 0.9 % IV SOLN
143.4000 mg | Freq: Once | INTRAVENOUS | Status: AC
  Administered 2017-12-15: 140 mg via INTRAVENOUS
  Filled 2017-12-15: qty 14

## 2017-12-15 MED ORDER — SODIUM CHLORIDE 0.9 % IV SOLN: 10.0000 mg | Freq: Once | INTRAVENOUS | Status: DC

## 2017-12-15 MED ORDER — DEXAMETHASONE SODIUM PHOSPHATE 10 MG/ML IJ SOLN
10.0000 mg | Freq: Once | INTRAMUSCULAR | Status: AC
  Administered 2017-12-15: 10 mg via INTRAVENOUS

## 2017-12-15 MED ORDER — HYDROCODONE-ACETAMINOPHEN 7.5-325 MG/15ML PO SOLN: 5.0000 mL | Freq: Four times a day (QID) | ORAL | 0 refills | Status: DC | PRN

## 2017-12-15 MED ORDER — PALONOSETRON HCL INJECTION 0.25 MG/5ML
INTRAVENOUS | Status: AC
  Filled 2017-12-15: qty 5

## 2017-12-15 MED ORDER — SODIUM CHLORIDE 0.9 % IV SOLN
35.0000 mg/m2 | Freq: Once | INTRAVENOUS | Status: AC
  Administered 2017-12-15: 72 mg via INTRAVENOUS
  Filled 2017-12-15: qty 12

## 2017-12-15 MED ORDER — SODIUM CHLORIDE 0.9 % IV SOLN
Freq: Once | INTRAVENOUS | Status: AC
  Administered 2017-12-15: 14:00:00 via INTRAVENOUS
  Filled 2017-12-15: qty 250

## 2017-12-15 NOTE — Patient Instructions (Signed)
[facility] Discharge Instructions for Patients Receiving Chemotherapy  Today you received the following chemotherapy agents Taxol and carboplatin.  To help prevent nausea and vomiting after your treatment, we encourage you to take your nausea medication as directed by MD   If you develop nausea and vomiting that is not controlled by your nausea medication, call the clinic.   BELOW ARE SYMPTOMS THAT SHOULD BE REPORTED IMMEDIATELY:  *FEVER GREATER THAN 100.5 F  *CHILLS WITH OR WITHOUT FEVER  NAUSEA AND VOMITING THAT IS NOT CONTROLLED WITH YOUR NAUSEA MEDICATION  *UNUSUAL SHORTNESS OF BREATH  *UNUSUAL BRUISING OR BLEEDING  TENDERNESS IN MOUTH AND THROAT WITH OR WITHOUT PRESENCE OF ULCERS  *URINARY PROBLEMS  *BOWEL PROBLEMS  UNUSUAL RASH Items with * indicate a potential emergency and should be followed up as soon as possible.  Feel free to call the clinic should you have any questions or concerns. The clinic phone number is [phone].  Please show the [place] at check-in to the Emergency Department and triage nurse.

## 2017-12-15 NOTE — Progress Notes (Signed)
  [facility] OFFICE PROGRESS NOTE   Diagnosis: Esophagus cancer  INTERVAL HISTORY:   [name] returns as scheduled.  He continues radiation.  He completed a third cycle of weekly Taxol/carboplatin 12/08/2017.  He noted "heart fluttering" and restlessness with the premedications last week.  He wonders if this was related to the Benadryl.  He denies nausea/vomiting.  No mouth sores.  No diarrhea or constipation.  No numbness or tingling in his hands or feet.  He notes increased pain with swallowing.  Hydrocodone helps.  He is able to eat and drink without difficulty.  Objective:  Vital signs in last 24 hours:  Blood pressure (!) 111/55, pulse 73, temperature 98.5 F (36.9 C), temperature source Oral, resp. rate 18, height 5\' 6"  (1.676 m), weight 178 lb 9.6 oz (81 kg), SpO2 98 %.    HEENT: No thrush or ulcers. Resp: Lungs clear bilaterally. Cardio: Regular rate and rhythm. GI: Abdomen soft and nontender.  No hepatomegaly. Vascular: No leg edema.    Lab Results:  Lab Results  Component Value Date   WBC 1.8 (L) 12/15/2017   HGB 10.9 (L) 12/15/2017   HCT 32.2 (L) 12/15/2017   MCV 93.3 12/15/2017   PLT 89 (L) 12/15/2017   NEUTROABS 1.4 (L) 12/15/2017    Imaging:  No results found.  Medications: I have reviewed the patient's current medications.  Assessment/Plan: 1. 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 Staging CTs 10/26/2017-asymmetric esophageal thickening, no evidence of metastatic disease ? Endoscopic biopsy 10/20/2017-mid esophagus partially obstructing mass, biopsy confirmed squamous cell carcinoma ? PET scan 11/02/2017-wall thickening in the mid esophagus SUV 27 MA: No findings specific for metastatic disease ? Radiation beginning 11/09/2017 ? Cycle 1 weekly Taxol/carboplatin 11/10/2017 ? Cycle 2 weekly Taxol/carboplatin 11/17/2017 (carboplatin held due to thrombocytopenia) ? Cycle 3 weekly Taxol/carboplatin 12/08/2017 (doses reduced  due to neutropenia, thrombocytopenia) ? Cycle 4 weekly Taxol/carboplatin 12/15/2017 2. Solid dysphagia and odynophagia secondary to #1 3. Coronary artery disease 4. History of prostate cancer 5. COPD 6. Permanent cardiac pacemaker 7. Peripheral vascular disease 8. Chronic thrombocytopenia 9. Neutropenia thrombocytopenia secondary to chemotherapy.  And probable underlying cirrhosis  Disposition: Mr. Willmon appears stable.  He completed cycle 3 weekly Taxol/carboplatin on 12/08/2017.  We reviewed the CBC from today.  Counts are adequate to proceed with treatment.  He will receive cycle 4 weekly Taxol/carboplatin today as scheduled.  He understands to contact the office with fever, chills, other signs of infection, bleeding.  Due to the "heart fluttering" and restlessness with the premedications last week we will eliminate Benadryl from the regimen.  He is scheduled to complete the course of radiation 12/17/2017.  I am sending a new prescription for hydrocodone to his pharmacy.  He will return for lab and a follow-up visit in 2 weeks.  He will contact the office in the interim with any problems.    Ned Card ANP/GNP-BC   12/15/2017  11:57 AM

## 2017-12-15 NOTE — Telephone Encounter (Signed)
Printed avs and calender of upcoming appointment. Per 10/1 los 

## 2017-12-15 NOTE — Progress Notes (Signed)
Nutrition Follow-up:  Patient with esophageal cancer.  Patient to receive last chemo today and last radiation treatment on 10/3.    Met with patient during infusion today. Reports that soreness when swallowing foods.  Reports that he ate gravy biscuit this am.  Yesterday ate bacon, eggs and grits without problems. Skipped lunch yesterday and ate chicken pot pie for supper last night.  Continue to drink about 2 shakes per day.  Drinks milk, juice and soft drinks.    Denies any other nutrition impact sypmtoms  Medications: reviewed  Labs: glucose 129  Anthropometrics:   Weight decreased to 178 lb 9.6 oz today from 183 lb 12.8 oz on 9/24.    3% weight loss in the last week  NUTRITION DIAGNOSIS: Inadequate oral intake continues with weight loss   INTERVENTION:  Encouraged patient to continue to drink oral nutrition supplements. Discussed ways to increase calories and protein. Encouraged adding gravies, sauces to foods for easy of swallowing.      MONITORING, EVALUATION, GOAL: Patient will consume adequate calories to prevent further weight loss   NEXT VISIT: as needed  Pope Brunty B. Zenia Resides, [place], [place] Registered Dietitian [phone] (pager)

## 2017-12-16 ENCOUNTER — Ambulatory Visit
Admission: RE | Admit: 2017-12-16 | Discharge: 2017-12-16 | Disposition: A | Discharge status: Home / Self Care | Payer: Medicare Other | Source: Ambulatory Visit | Attending: Radiation Oncology | Admitting: Radiation Oncology

## 2017-12-16 DIAGNOSIS — C154 Malignant neoplasm of middle third of esophagus: Secondary | ICD-10-CM | POA: Diagnosis not present

## 2017-12-16 LAB — CUP PACEART REMOTE DEVICE CHECK
Battery Remaining Longevity: 74 mo
Battery Remaining Percentage: 57 %
Brady Statistic AP VP Percent: 1 %
Brady Statistic AP VS Percent: 13 %
Brady Statistic AS VP Percent: 1 %
Brady Statistic AS VS Percent: 85 %
Brady Statistic RV Percent Paced: 1 %
Date Time Interrogation Session: 20190921013259
Implantable Lead Implant Date: 19990914
Implantable Lead Implant Date: 19990914
Implantable Lead Location: 753860
Lead Channel Impedance Value: 330 Ohm
Lead Channel Pacing Threshold Amplitude: 0.5 V
Lead Channel Pacing Threshold Amplitude: 1 V
Lead Channel Pacing Threshold Pulse Width: 0.7 ms
Lead Channel Sensing Intrinsic Amplitude: 0.5 mV
Lead Channel Setting Pacing Pulse Width: 0.7 ms
MDC IDC LEAD LOCATION: 753859
MDC IDC MSMT BATTERY VOLTAGE: 2.89 V
MDC IDC MSMT LEADCHNL RA PACING THRESHOLD PULSEWIDTH: 0.4 ms
MDC IDC MSMT LEADCHNL RV IMPEDANCE VALUE: 540 Ohm
MDC IDC MSMT LEADCHNL RV SENSING INTR AMPL: 12 mV
MDC IDC PG IMPLANT DT: 20110601
MDC IDC PG SERIAL: 7130714
MDC IDC SET LEADCHNL RA PACING AMPLITUDE: 2 V
MDC IDC SET LEADCHNL RV PACING AMPLITUDE: 1.25 V
MDC IDC SET LEADCHNL RV SENSING SENSITIVITY: 2 mV
MDC IDC STAT BRADY RA PERCENT PACED: 12 %
Pulse Gen Model: 2210

## 2017-12-17 ENCOUNTER — Ambulatory Visit
Admission: RE | Admit: 2017-12-17 | Discharge: 2017-12-17 | Disposition: A | Discharge status: Home / Self Care | Payer: Medicare Other | Source: Ambulatory Visit | Attending: Radiation Oncology | Admitting: Radiation Oncology

## 2017-12-17 ENCOUNTER — Ambulatory Visit: Payer: Medicare Other

## 2017-12-17 ENCOUNTER — Encounter: Payer: Self-pay | Admitting: Radiation Oncology

## 2017-12-17 DIAGNOSIS — C154 Malignant neoplasm of middle third of esophagus: Secondary | ICD-10-CM | POA: Diagnosis not present

## 2017-12-18 ENCOUNTER — Ambulatory Visit: Payer: Medicare Other

## 2017-12-21 ENCOUNTER — Ambulatory Visit: Payer: Medicare Other

## 2017-12-27 ENCOUNTER — Other Ambulatory Visit: Payer: Self-pay | Admitting: Oncology

## 2017-12-28 ENCOUNTER — Encounter: Payer: Self-pay | Admitting: Cardiology

## 2017-12-28 ENCOUNTER — Ambulatory Visit (INDEPENDENT_AMBULATORY_CARE_PROVIDER_SITE_OTHER)
Admission: RE | Admit: 2017-12-28 | Discharge: 2017-12-28 | Disposition: A | Discharge status: Home / Self Care | Payer: Medicare Other | Source: Ambulatory Visit | Attending: Cardiology | Admitting: Cardiology

## 2017-12-28 ENCOUNTER — Ambulatory Visit (INDEPENDENT_AMBULATORY_CARE_PROVIDER_SITE_OTHER): Payer: Medicare Other | Admitting: Cardiology

## 2017-12-28 ENCOUNTER — Telehealth: Payer: Self-pay | Admitting: Cardiology

## 2017-12-28 VITALS — BP 104/66 | HR 116 | Ht 66.0 in | Wt 173.4 lb

## 2017-12-28 DIAGNOSIS — I480 Paroxysmal atrial fibrillation: Secondary | ICD-10-CM

## 2017-12-28 DIAGNOSIS — E785 Hyperlipidemia, unspecified: Secondary | ICD-10-CM

## 2017-12-28 DIAGNOSIS — R7989 Other specified abnormal findings of blood chemistry: Secondary | ICD-10-CM

## 2017-12-28 DIAGNOSIS — I10 Essential (primary) hypertension: Secondary | ICD-10-CM

## 2017-12-28 DIAGNOSIS — I2510 Atherosclerotic heart disease of native coronary artery without angina pectoris: Secondary | ICD-10-CM

## 2017-12-28 DIAGNOSIS — R000 Tachycardia, unspecified: Secondary | ICD-10-CM

## 2017-12-28 DIAGNOSIS — R0602 Shortness of breath: Secondary | ICD-10-CM

## 2017-12-28 DIAGNOSIS — G4733 Obstructive sleep apnea (adult) (pediatric): Secondary | ICD-10-CM

## 2017-12-28 DIAGNOSIS — C154 Malignant neoplasm of middle third of esophagus: Secondary | ICD-10-CM

## 2017-12-28 DIAGNOSIS — E7800 Pure hypercholesterolemia, unspecified: Secondary | ICD-10-CM | POA: Insufficient documentation

## 2017-12-28 LAB — D-DIMER, QUANTITATIVE: D-DIMER: 1.63 mg/L FEU — ABNORMAL HIGH (ref 0.00–0.49)

## 2017-12-28 MED ORDER — IOPAMIDOL (ISOVUE-370) INJECTION 76%
80.0000 mL | Freq: Once | INTRAVENOUS | Status: AC | PRN
  Administered 2017-12-28: 80 mL via INTRAVENOUS

## 2017-12-28 MED ORDER — LOSARTAN POTASSIUM 25 MG PO TABS: 25.0000 mg | ORAL_TABLET | Freq: Every day | ORAL | 3 refills | Status: DC

## 2017-12-28 NOTE — Telephone Encounter (Signed)
[name] with [facility] Radiology called with stat CT results. Will forward to Pecolia Ades NP.    IMPRESSION: No evidence of pulmonary emboli.  Esophageal thickening consistent with the given clinical history.  Chronic changes as described above.  Aortic Atherosclerosis (ICD10-I70.0) and Emphysema (ICD10-J43.9).  These results will be called to the ordering clinician or representative by the Radiologist Assistant, and communication documented in the PACS or zVision Dashboard.

## 2017-12-28 NOTE — Patient Instructions (Addendum)
Medication Instructions:  STOP: ASPIRIN  STOP: MAXZIDE  START: LOSARTAN 25 MG DAILY  If you need a refill on your cardiac medications before your next appointment, please call your pharmacy.   Lab work: TODAY: BMET,TSH,CBC, D-DIMER  If you have labs (blood work) drawn today and your tests are completely normal, you will receive your results only by: [name] MyChart Message (if you have MyChart) OR . A paper copy in the mail If you have any lab test that is abnormal or we need to change your treatment, we will call you to review the results.  Testing/Procedures: None  Follow-Up: Please keep your appointment with Dr. Radford Pax on 02/15/18 @ 9:20 AM   Any Other Special Instructions Will Be Listed Below (If Applicable).  You have a appointment scheduled to see Ned Card, NP tomorrow 12/29/17 @ 1:30 PM their address is  [facility] at [place] [place], [place] [address]

## 2017-12-28 NOTE — Telephone Encounter (Signed)
New Message    Traci with [facility] is calling to give stat results for CT of the chest.

## 2017-12-28 NOTE — Progress Notes (Signed)
Cardiology Office Note:    Date:  12/28/2017   ID:  [name], DOB Jul 15, 1934, MRN [id number]  PCP:  [name], MD  Cardiologist:  [name], MD  Referring MD: [name], *   Chief Complaint  Patient presents with  . Follow-up    CAD    History of Present Illness:    [name] is a 83 y.o. male with a past medical history significant for CAD s/p PTCA and CABG (1994,1995)nowwith chronic occlusion of SVG to RCA per cardiac cath 2017(see below),AVSs/p [id number], HTN, remote history of PAF not on anticoagulationsecondary to nosebleeds, COPD, sleep apnea on nocturnal BiPAP, remote prostate CAs/pprostatectomy and SSSs/p PPM.  Had complaints of chest pain in June and underwent a stress test which was abnormal.  He therefore underwent cardiac catheterization 08/28/2017 08/28/2017 that showed known occlusion of SVG-RCA, widely patent LIMA-LAD, SVG- 1st diag, Seq SVG-RI-OM3.  On 09/01/2017 Dr. Irish Lack attempted to open his RCA but was unsuccessful.  He continued to have complaints of chest pain and was arranged for a repeated attempt at PCI of the RCA.  On 10/02/2017 he underwent successful difficult PCI of ostial-proximal RCA with rotational atherectomy and DES stent placement by Dr. Ellyn Hack. Recommendation is for dual antiplatelet therapy with aspirin 81 mg and clopidogrel 75 mg long-term (beyond 12 months) because of ostial stent and diffuse disease elsewhere.  Dr. Ellyn Hack noted that it would be okay to stop aspirin after 3-6 months.  Imdur was stopped.  He is also been found to have esophageal cancer and has just completed chemo/radiation.  He was evaluated for right leg DVT 10/28/2017 which was negative.   He is here today alone for follow up. He says that his heart is doing better since stent placement with no chest pain but he is back to being short of breath. He has no energy and wants to lay around and sleep. He has just completed chemo and radiation  for esophageal cancer. He has trouble walking to the mailbox and back. He is anemic but stable per oncology. He has been eating poorly and has had trouble getting nthe nutritional supplement drinks down. He thins that his appetite has improved over the past weekend. He has lost his taste. Occ has feeling of being "swimmy headed" and feels like he has to sit down.   His wt was 189 on 10/28/17 down to 173 today. HR is elevated today. He admits that he is likely dehydrated. Drinking liquids causes pain to swallow. He thinks that cold beverages are better.    Past Medical History:  Diagnosis Date  . AAA (abdominal aortic aneurysm) ([place])    a. Last duplex - 3.8cm 01/2013 - due 01/2014.  [name] Acne rosacea   . Alcoholic hepatitis with ascites 08/28/2017  . Anginal pain ([place]) 1996  . Aortic stenosis, mild    Last echo 05/04/12 +LVH  . Arthritis    "shoulders" (09/01/2017)  . Back pain    hx epidural injections  . Baker's cyst    Lt.  [name] CAD (coronary artery disease)    a. s/p MI/PTCA - Cx 1994. b. s/p CABG x5 in 1997. c. Abnl nuc 2003- occ SVG-RCA, patent seq SVG-OM1-dCxOM, patentl LIMA-LAD, patent, SVG-diag. d. Normal nuc 09/2012.  . Carotid artery disease ([place])    a. Duplex 01/2013: mildly abnormal. Mild plaque without significant diameter reduction. Repeat recommended 11/15.  [name] COPD (chronic obstructive pulmonary disease) ([place])   . Dizziness and giddiness 08/04/2013  . GERD (  gastroesophageal reflux disease)   . H/O: GI bleed    mild, neg. colonoscopy  . Heart murmur   . HOH (hard of hearing)   . HTN (hypertension)   . Hyperlipidemia   . Hypertensive heart disease   . Myocardial infarction ([place]) 1994  . OSA on CPAP   . OSA treated with BiPAP 11/20/2015   Severe with [facility] 38/hr  . PAF (paroxysmal atrial fibrillation) ([place])    a. Remote per records.   . Pneumonia    history  . Presence of permanent cardiac pacemaker    DOI 1999 with change out 2006; St Jude  . Prostate cancer ([place]) ~ 2010    a. s/p radical retropubic prostatectomy with bilateral pelvic lymph node dissection  . PVD (peripheral vascular disease) ([place])    a. Rt ext iliac stenosis, last PV cath 2003, moderate stenosis last Lower Ext Dopplers 12/16/11 - Rt. ABI 0.96  Lt ABI 1.0.  . Sick sinus syndrome ([place])    a. placed 1999, gen change 2011 - St. Jude.  . Sinus node dysfunction ([place]) 01/20/2013  . Syncope 11/28/97  . Thrombocytopenia ([facility])    chronic, mild  . Valvular heart disease    a. Echo 04/2012: mild-mod AS, mild AI, mild MR.    Past Surgical History:  Procedure Laterality Date  . APPENDECTOMY    . BIOPSY  10/20/2017   Procedure: BIOPSY;  Surgeon: Doran Stabler, MD;  Location: [facility];  Service: Gastroenterology;;  . CARDIAC CATHETERIZATION  2003   VG to RCA occluded, other grafts patent  . CARDIAC CATHETERIZATION N/A 05/04/2015   Procedure: Right/Left Heart Cath and Coronary/Graft Angiography;  Surgeon: Sherren Mocha, MD;  Location: [facility];  Service: Cardiovascular;  Laterality: N/A;  . CARDIAC CATHETERIZATION  09/01/2017  . CARDIAC VALVE REPLACEMENT    . CATARACT EXTRACTION W/ INTRAOCULAR LENS  IMPLANT, BILATERAL Bilateral   . COLONOSCOPY    . CORONARY ANGIOPLASTY  03/29/92   PTCA to LCX  . CORONARY ARTERY BYPASS GRAFT  1997   LIMA-LAD; VG-diag; seq VG-1st OM & distal LCX; VG-RCA  . CORONARY ATHERECTOMY N/A 09/01/2017   Procedure: CORONARY ATHERECTOMY;  Surgeon: Jettie Booze, MD;  Location: [facility];  Service: Cardiovascular;  Laterality: N/A;  . CORONARY ATHERECTOMY  10/01/2017  . CORONARY ATHERECTOMY N/A 10/01/2017   Procedure: CORONARY ATHERECTOMY;  Surgeon: Leonie Man, MD;  Location: [facility];  Service: Cardiovascular;  Laterality: N/A;  . ESOPHAGOGASTRODUODENOSCOPY (EGD) WITH PROPOFOL N/A 10/20/2017   Procedure: ESOPHAGOGASTRODUODENOSCOPY (EGD) WITH PROPOFOL;  Surgeon: Doran Stabler, MD;  [facility]: [facility];  Service: Gastroenterology;   Laterality: N/A;  . INSERT / REPLACE / REMOVE PACEMAKER  11/28/1997   pacesetter--ERI 2011  . LEFT HEART CATH N/A 09/01/2017   Procedure: Left Heart Cath;  Surgeon: Jettie Booze, MD;  Location: [facility];  Service: Cardiovascular;  Laterality: N/A;  . LEFT HEART CATH AND CORS/GRAFTS ANGIOGRAPHY N/A 08/28/2017   Procedure: LEFT HEART CATH AND CORS/GRAFTS ANGIOGRAPHY;  Surgeon: Leonie Man, MD;  Location: [facility];  Service: Cardiovascular;  Laterality: N/A;  . PACEMAKER GENERATOR CHANGE  08/15/2009   St. Jude accent  . SUPRAPUBIC PROSTATECTOMY    . TEE WITHOUT CARDIOVERSION N/A 06/12/2015   Procedure: TRANSESOPHAGEAL ECHOCARDIOGRAM (TEE);  Surgeon: Sherren Mocha, MD;  Location: [facility];  Service: Open Heart Surgery;  Laterality: N/A;  . TONSILLECTOMY    . TRANSCATHETER AORTIC VALVE REPLACEMENT,  TRANSFEMORAL N/A 06/12/2015   Procedure: TRANSCATHETER AORTIC VALVE REPLACEMENT, TRANSFEMORAL, possible transpical;  Surgeon: Sherren Mocha, MD;  Location: [facility];  Service: Open Heart Surgery;  Laterality: N/A;    Current Medications: Current Meds  Medication Sig  . acetaminophen (TYLENOL) 325 MG tablet Take 650 mg by mouth 2 (two) times daily.  [name] atorvastatin (LIPITOR) 40 MG tablet Take 1 tablet (40 mg total) by mouth daily at 6 PM.  . clopidogrel (PLAVIX) 75 MG tablet Take 1 tablet (75 mg total) by mouth daily.  [name] HYDROcodone-acetaminophen (HYCET) 7.5-325 mg/15 ml solution Take 5 mLs by mouth 4 (four) times daily as needed for moderate pain.  . metoprolol tartrate (LOPRESSOR) 25 MG tablet TAKE 1 TABLET BY MOUTH TWICE DAILY  . nitroGLYCERIN (NITROSTAT) 0.4 MG SL tablet Place 1 tablet (0.4 mg total) under the tongue every 5 (five) minutes as needed for chest pain.  . Omega-3 Fatty Acids (FISH OIL) 1200 MG CAPS Take 1,200 mg by mouth every evening.  . pantoprazole (PROTONIX) 40 MG tablet Take 1 tablet (40 mg total) by mouth daily.  . vitamin C (ASCORBIC ACID) 500 MG tablet  Take 500 mg by mouth every evening.  . [DISCONTINUED] aspirin EC 81 MG EC tablet Take 1 tablet (81 mg total) by mouth daily.  . [DISCONTINUED] triamterene-hydrochlorothiazide (MAXZIDE-25) 37.5-25 MG tablet TAKE 1 TABLET BY MOUTH DAILY MONDAY THROUGH FRIDAY (Patient taking differently: Take 1 tablet by mouth daily. TAKE 1 TABLET BY MOUTH DAILY MONDAY THROUGH FRIDAY)     Allergies:   Patient has no known allergies.   Social History   Socioeconomic History  . Marital status: Widowed    Spouse name: Not on file  . Number of children: 5  . Years of education: 7th  . Highest education level: Not on file  Occupational History  . Occupation: Retired  Scientific laboratory technician  . Financial resource strain: Not on file  . Food insecurity:    Worry: Not on file    Inability: Not on file  . Transportation needs:    Medical: Not on file    Non-medical: Not on file  Tobacco Use  . Smoking status: Former Smoker    Packs/day: 2.00    Years: 48.00    Pack years: 96.00    Types: Cigarettes    Last attempt to quit: 03/18/1995    Years since quitting: 22.7  . Smokeless tobacco: Never Used  Substance and Sexual Activity  . Alcohol use: No    Comment: 09/01/2017 "nothing in the last couple years"  . Drug use: Never  . Sexual activity: Not Currently  Lifestyle  . Physical activity:    Days per week: Not on file    Minutes per session: Not on file  . Stress: Not on file  Relationships  . Social connections:    Talks on phone: Not on file    Gets together: Not on file    Attends religious service: Not on file    Active member of club or organization: Not on file    Attends meetings of clubs or organizations: Not on file    Relationship status: Not on file  Other Topics Concern  . Not on file  Social History Narrative  . Not on file     Family History: [name] family history includes CAD in his unknown relative; Cancer in his brother; Colon cancer in his father; Heart Problems in his brother  and sister. ROS:   Please see the history of  present illness.     All other systems reviewed and are negative.  EKGs/Labs/Other Studies Reviewed:    The following studies were reviewed today:  Echo 08/26/2017 Study Conclusions - Left ventricle: The cavity size was normal. There was mild   concentric hypertrophy. Systolic function was mildly reduced. The   estimated ejection fraction was in the range of 45% to 50%.   Hypokinesis of the inferoseptal myocardium; consistent with   ischemia in the distribution of the right coronary artery.   Hypokinesis of the apicalanteroseptal myocardium; consistent with   ischemia in the distribution of the left anterior descending   coronary artery; new since the study of 07/07/2016. Features are   consistent with a pseudonormal left ventricular filling pattern,   with concomitant abnormal relaxation and increased filling   pressure (grade 2 diastolic dysfunction). - Ventricular septum: Septal motion showed abnormal function and   dyssynergy. These changes are consistent with right ventricular   pacing. - Aortic valve: A bioprosthesis was present and functioning   normally. Valve area (VTI): 1.79 cm^2. Valve area (Vmax): 1.63   cm^2. Valve area (Vmean): 1.54 cm^2. - Mitral valve: Calcified annulus. - Left atrium: The atrium was mildly dilated. - Pulmonary arteries: PA peak pressure: 38 mm Hg (S).  Impressions: - Left ventricular systolic function has worsened since April 2018.   Due to technical quality of the images and profound   pacing-induced dyssynchrony, wall motion analysis is challenging.   Nevertheless, there appears to be a new inferoseptal and   apicoseptal wall motion abnormality.  EKG:  EKG is ordered today.  The ekg ordered today demonstrates sinus tach 114 bpm, LBBB  Recent Labs: 08/25/2017: B Natriuretic Peptide 121.7 12/15/2017: ALT 32; BUN 12; Creatinine 0.81; Hemoglobin 10.9; Platelet Count 89; Potassium 4.0; Sodium 139    Recent Lipid Panel    Component Value Date/Time   CHOL 120 10/22/2017 1154   TRIG 144 10/22/2017 1154   HDL 43 10/22/2017 1154   CHOLHDL 2.8 10/22/2017 1154   CHOLHDL 3.3 04/26/2015 1620   VLDL 41 (H) 04/26/2015 1620   LDLCALC 48 10/22/2017 1154    Physical Exam:    VS:  BP 104/66   Pulse (!) 116   Ht 5\' 6"  (1.676 m)   Wt 173 lb 6.4 oz (78.7 kg)   SpO2 97%   BMI 27.99 kg/m     Wt Readings from Last 3 Encounters:  12/28/17 173 lb 6.4 oz (78.7 kg)  12/15/17 178 lb 9.6 oz (81 kg)  12/07/17 183 lb 12.8 oz (83.4 kg)     Physical Exam  Constitutional: He is oriented to person, place, and time. He appears well-developed and well-nourished. No distress.  HENT:  Head: Normocephalic and atraumatic.  Neck: Normal range of motion. Neck supple. No JVD present.  Cardiovascular: Regular rhythm, normal heart sounds and intact distal pulses. Tachycardia present. Exam reveals no gallop and no friction rub.  No murmur heard. Pulmonary/Chest: Effort normal and breath sounds normal. No respiratory distress. He has no wheezes. He has no rales.  Abdominal: Soft. Bowel sounds are normal.  Musculoskeletal: Normal range of motion. He exhibits no edema.  Neurological: He is alert and oriented to person, place, and time.  Skin: Skin is warm and dry.  Psychiatric: He has a normal mood and affect. His behavior is normal. Judgment and thought content normal.  Vitals reviewed.    ASSESSMENT:    1. PAF (paroxysmal atrial fibrillation) ([place])   2. Tachycardia   3.  SOB (shortness of breath)   4. CAD, multiple vessel   5. Essential hypertension   6. Dyslipidemia, goal LDL below 70   7. OSA treated with BiPAP   8. 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 Positive D-dimer    PLAN:    In order of problems listed above:  CAD:s/p past CABG with known occlusion of SVG-RCA, widely patent LIMA-LAD, SVG- 1st diag, Seq SVG-RI-OM3.  On 10/02/2017 he underwent successful difficult PCI  of ostial-proximal RCA with rotational atherectomy and DES stent placement. On dual antiplatelet therapy with Plavix and aspirin 81 mg.  He continues on beta-blocker, statin. Will stop aspirin as per prior plan. Today he reports that his chest pain resolved with stent placement but he is very weak and short of breath. With his esophageal cancer treatment, poor oral intake with wt loss, anemia,  I think his symptoms are related to this. I will check BMet, CBC, D-Dimer and TSH (tachycardia today).   Paroxysmal atrial fibrillation: Has recent run of afib on recent PPM check. Today EKG shows sinus tach. Pt has hx of recurrent nose bleeds requiring ED visits. He says he still has them occasionally. He has not been on anticoagulant because of this. He plans to go to ENT once his cancer treatments with frequent office visits are over.   Hypertension:metoprolol 25 mg, triamterene-hydrochlorothiazide 25 mg. BP is low today. With his recent wt loss, tachycardia, poor oral intake, will change Maxzide to low dose losartan without HCTZ.  Dyslipidemia: atorvastatin 40 mg switched from Zocor in June.   Lipid panel on 10/22/2017 showed LDL of 48 which is at goal of less than 70  S/P TAVR 2017: bioprosthetic AV functioning normally by echo in 08/2017.  Dual-chamber pacemaker:followed by Dr. [name]. Has rare V pacing per last PPM check, so tachycardia likely not related to V pacing.  OSA: Uses CPAP every night. Followed by Dr. Radford Pax.  Esophageal cancer: Now finished with chemo and radiation.  He has follow up with Oncology tomorrow.   Anemia: Hgb stable at 10.9. He was on iron which has now been stopped. He has symptoms as above. Will check labs today.  Addendum: D-dimer positive. Still waiting on CBC. Discussed with Dr. [name]. Will order stat CT PE protocol. If positive for PE will need to go to the hospital and further eval by heme/onc.   Medication Adjustments/Labs and Tests Ordered: Current medicines  are reviewed at length with the patient today.  Concerns regarding medicines are outlined above. Labs and tests ordered and medication changes are outlined in the patient instructions below:  Patient Instructions  Medication Instructions:  STOP: ASPIRIN  STOP: MAXZIDE  START: LOSARTAN 25 MG DAILY  If you need a refill on your cardiac medications before your next appointment, please call your pharmacy.   Lab work: TODAY: BMET,TSH,CBC, D-DIMER  If you have labs (blood work) drawn today and your tests are completely normal, you will receive your results only by: [name] MyChart Message (if you have MyChart) OR . A paper copy in the mail If you have any lab test that is abnormal or we need to change your treatment, we will call you to review the results.  Testing/Procedures: None  Follow-Up: Please keep your appointment with Dr. Radford Pax on 02/15/18 @ 9:20 AM   Any Other Special Instructions Will Be Listed Below (If Applicable).  You have a appointment scheduled to see Ned Card, NP tomorrow 12/29/17 @ 1:30 PM their address is  [facility] [address], [place] [address]      Signed, Daune Perch, NP  12/28/2017 1:22 PM    [facility]

## 2017-12-29 ENCOUNTER — Inpatient Hospital Stay (HOSPITAL_BASED_OUTPATIENT_CLINIC_OR_DEPARTMENT_OTHER): Payer: Medicare Other | Admitting: Nurse Practitioner

## 2017-12-29 ENCOUNTER — Encounter: Payer: Self-pay | Admitting: Nurse Practitioner

## 2017-12-29 ENCOUNTER — Inpatient Hospital Stay: Payer: Medicare Other

## 2017-12-29 ENCOUNTER — Telehealth: Payer: Self-pay | Admitting: Oncology

## 2017-12-29 VITALS — BP 110/64 | HR 80 | Temp 98.9°F | Resp 18 | Ht 66.0 in | Wt 174.2 lb

## 2017-12-29 DIAGNOSIS — I2510 Atherosclerotic heart disease of native coronary artery without angina pectoris: Secondary | ICD-10-CM | POA: Diagnosis not present

## 2017-12-29 DIAGNOSIS — R0609 Other forms of dyspnea: Secondary | ICD-10-CM

## 2017-12-29 DIAGNOSIS — D6959 Other secondary thrombocytopenia: Secondary | ICD-10-CM

## 2017-12-29 DIAGNOSIS — Z5111 Encounter for antineoplastic chemotherapy: Secondary | ICD-10-CM | POA: Diagnosis not present

## 2017-12-29 DIAGNOSIS — C159 Malignant neoplasm of esophagus, unspecified: Secondary | ICD-10-CM

## 2017-12-29 DIAGNOSIS — Z8546 Personal history of malignant neoplasm of prostate: Secondary | ICD-10-CM

## 2017-12-29 DIAGNOSIS — D701 Agranulocytosis secondary to cancer chemotherapy: Secondary | ICD-10-CM | POA: Diagnosis not present

## 2017-12-29 DIAGNOSIS — I739 Peripheral vascular disease, unspecified: Secondary | ICD-10-CM

## 2017-12-29 DIAGNOSIS — C154 Malignant neoplasm of middle third of esophagus: Secondary | ICD-10-CM

## 2017-12-29 DIAGNOSIS — J449 Chronic obstructive pulmonary disease, unspecified: Secondary | ICD-10-CM

## 2017-12-29 LAB — CBC WITH DIFFERENTIAL (CANCER CENTER ONLY)
Abs Immature Granulocytes: 0.01 10*3/uL (ref 0.00–0.07)
BASOS ABS: 0 10*3/uL (ref 0.0–0.1)
BASOS PCT: 1 %
Eosinophils Absolute: 0.1 10*3/uL (ref 0.0–0.5)
Eosinophils Relative: 3 %
HCT: 28 % — ABNORMAL LOW (ref 39.0–52.0)
Hemoglobin: 9.5 g/dL — ABNORMAL LOW (ref 13.0–17.0)
IMMATURE GRANULOCYTES: 1 %
LYMPHS ABS: 0.3 10*3/uL — AB (ref 0.7–4.0)
Lymphocytes Relative: 18 %
MCH: 31.5 pg (ref 26.0–34.0)
MCHC: 33.9 g/dL (ref 30.0–36.0)
MCV: 92.7 fL (ref 80.0–100.0)
MONOS PCT: 29 %
Monocytes Absolute: 0.6 10*3/uL (ref 0.1–1.0)
NEUTROS PCT: 48 %
NRBC: 0 % (ref 0.0–0.2)
Neutro Abs: 0.9 10*3/uL — ABNORMAL LOW (ref 1.7–7.7)
PLATELETS: 74 10*3/uL — AB (ref 150–400)
RBC: 3.02 MIL/uL — ABNORMAL LOW (ref 4.22–5.81)
RDW: 14.9 % (ref 11.5–15.5)
WBC Count: 1.9 10*3/uL — ABNORMAL LOW (ref 4.0–10.5)

## 2017-12-29 LAB — BASIC METABOLIC PANEL
BUN / CREAT RATIO: 22 (ref 10–24)
BUN: 21 mg/dL (ref 8–27)
CO2: 22 mmol/L (ref 20–29)
Calcium: 9.1 mg/dL (ref 8.6–10.2)
Chloride: 101 mmol/L (ref 96–106)
Creatinine, Ser: 0.94 mg/dL (ref 0.76–1.27)
GFR calc Af Amer: 88 mL/min/{1.73_m2} (ref >59)
GFR, EST NON AFRICAN AMERICAN: 76 mL/min/{1.73_m2} (ref >59)
GLUCOSE: 103 mg/dL — AB (ref 65–99)
POTASSIUM: 4 mmol/L (ref 3.5–5.2)
SODIUM: 140 mmol/L (ref 134–144)

## 2017-12-29 LAB — CBC
HEMOGLOBIN: 10.9 g/dL — AB (ref 13.0–17.7)
Hematocrit: 32 % — ABNORMAL LOW (ref 37.5–51.0)
MCH: 32 pg (ref 26.6–33.0)
MCHC: 34.1 g/dL (ref 31.5–35.7)
MCV: 94 fL (ref 79–97)
NRBC: 1 % — AB (ref 0–0)
PLATELETS: 104 10*3/uL — AB (ref 150–450)
RBC: 3.41 x10E6/uL — AB (ref 4.14–5.80)
RDW: 16.1 % — ABNORMAL HIGH (ref 12.3–15.4)
WBC: 1.8 10*3/uL — CL (ref 3.4–10.8)

## 2017-12-29 LAB — TSH: TSH: 1.97 u[IU]/mL (ref 0.450–4.500)

## 2017-12-29 MED ORDER — HYDROCODONE-ACETAMINOPHEN 7.5-325 MG/15ML PO SOLN: 5.0000 mL | Freq: Four times a day (QID) | ORAL | 0 refills | Status: DC | PRN

## 2017-12-29 NOTE — Progress Notes (Signed)
[facility] OFFICE PROGRESS NOTE   Diagnosis: Esophagus cancer  INTERVAL HISTORY:   [name] returns as scheduled.  He completed cycle 4 carboplatin/Taxol 12/15/2017.  He completed the course of radiation 12/17/2017.  He notes dysphagia and odynophagia have improved.  He is utilizing less pain medication.  He is tolerating soft solids.  No nausea or vomiting.  Appetite is better.  He has periodic nosebleeds.  No other bleeding.  He has stable dyspnea on exertion.  Objective:  Vital signs in last 24 hours:  Blood pressure 110/64, pulse 80, temperature 98.9 F (37.2 C), temperature source Oral, resp. rate 18, height 5\' 6"  (1.676 m), weight 174 lb 3.2 oz (79 kg), SpO2 100 %.    HEENT: No thrush or ulcers. Resp: Lungs clear bilaterally. Cardio: Regular rate and rhythm. GI: Abdomen soft and nontender.  No hepatomegaly. Vascular: No leg edema.    Lab Results:  Lab Results  Component Value Date   WBC 1.9 (L) 12/29/2017   HGB 9.5 (L) 12/29/2017   HCT 28.0 (L) 12/29/2017   MCV 92.7 12/29/2017   PLT 74 (L) 12/29/2017   NEUTROABS 0.9 (L) 12/29/2017    Imaging:  Ct Angio Chest Pe W Or Wo Contrast  Result Date: 12/28/2017 CLINICAL DATA:  History of esophageal cancer with increasing shortness of breath EXAM: CT ANGIOGRAPHY CHEST WITH CONTRAST TECHNIQUE: Multidetector CT imaging of the chest was performed using the standard protocol during bolus administration of intravenous contrast. Multiplanar CT image reconstructions and MIPs were obtained to evaluate the vascular anatomy. CONTRAST:  27mL ISOVUE-370 IOPAMIDOL (ISOVUE-370) INJECTION 76% COMPARISON:  11/02/2017 FINDINGS: Cardiovascular: The thoracic aorta demonstrates atherosclerotic changes without aneurysmal dilatation or dissection. Changes of prior coronary bypass grafting are noted. Prior TAVR is noted as well. The pulmonary artery shows a normal branching pattern bilaterally. No filling defects to suggest  pulmonary emboli are noted. Pacing device is noted. Mediastinum/Nodes: Thoracic inlet is within normal limits. No significant hilar or mediastinal adenopathy is noted. Thickening in the mid to distal esophagus is noted consistent with the patient's given clinical history of esophageal carcinoma. Lungs/Pleura: The lungs are well aerated bilaterally. Diffuse emphysematous changes are noted. No focal infiltrate or sizable effusion is seen. No definitive parenchymal nodules are identified. Upper Abdomen: Visualized upper abdomen demonstrates some nodularity to the liver which may be related to underlying cirrhosis. Musculoskeletal: Degenerative changes of the thoracic spine are seen. Review of the MIP images confirms the above findings. IMPRESSION: No evidence of pulmonary emboli. Esophageal thickening consistent with the given clinical history. Chronic changes as described above. Aortic Atherosclerosis (ICD10-I70.0) and Emphysema (ICD10-J43.9). These results will be called to the ordering clinician or representative by the Radiologist Assistant, and communication documented in the PACS or zVision Dashboard. Electronically Signed   By: Inez Catalina M.D.   On: 12/28/2017 15:11    Medications: I have reviewed the patient's current medications.  Assessment/Plan: 1. 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 Staging CTs 10/26/2017-asymmetric esophageal thickening, no evidence of metastatic disease ? Endoscopic biopsy 10/20/2017-mid esophagus partially obstructing mass, biopsy confirmed squamous cell carcinoma ? PET scan 11/02/2017-wall thickening in the mid esophagus SUV 27 MA: No findings specific for metastatic disease ? Radiation 11/09/2017-12/17/2017 ? Cycle 1 weekly Taxol/carboplatin 11/10/2017 ? Cycle 2 weekly Taxol/carboplatin 11/17/2017 (carboplatin held due to thrombocytopenia) ? Cycle 3 weekly Taxol/carboplatin 12/08/2017 (doses reduced due to neutropenia, thrombocytopenia) ? Cycle 4 weekly  Taxol/carboplatin 12/15/2017 2. Solid dysphagia and odynophagia secondary to #1  3. Coronary artery disease 4. History of prostate cancer 5. COPD 6. Permanent cardiac pacemaker 7. Peripheral vascular disease 8. Chronic thrombocytopenia 9. 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 CT chest 12/28/2017- no evidence of pulmonary emboli.  Esophageal thickening consistent with patient's history of esophageal carcinoma.  Disposition: [name] appears unchanged.  He has completed the course of radiation and chemotherapy.  The odynophagia is improving.  He was provided with a new prescription for hydrocodone at today's visit.  He will decrease use as tolerated.  We reviewed the CBC from today.  He has persistent neutropenia and thrombocytopenia.  Precautions were reviewed.  He understands to contact the office with fever, chills, other signs of infection, bleeding.  He will return for a follow-up CBC in 2 weeks.  He will return for a follow-up visit in 4 weeks.  He will contact the office in the interim as outlined above or with any other problems.  Plan reviewed with Dr. Benay Spice.      Ned Card ANP/GNP-BC   12/29/2017  1:46 PM

## 2017-12-29 NOTE — Telephone Encounter (Signed)
Gave pt avs and calendar  °

## 2018-01-01 ENCOUNTER — Telehealth: Payer: Self-pay | Admitting: *Deleted

## 2018-01-01 DIAGNOSIS — G4733 Obstructive sleep apnea (adult) (pediatric): Secondary | ICD-10-CM

## 2018-01-01 NOTE — Telephone Encounter (Signed)
Order faxed to [facility]

## 2018-01-01 NOTE — Telephone Encounter (Signed)
Per dr Radford Pax,  get an overnight pulse oximetry on BiPAP to assess for nocturnal hypoxemia.

## 2018-01-05 ENCOUNTER — Telehealth: Payer: Self-pay | Admitting: *Deleted

## 2018-01-05 DIAGNOSIS — G4733 Obstructive sleep apnea (adult) (pediatric): Secondary | ICD-10-CM

## 2018-01-05 NOTE — Telephone Encounter (Signed)
Order placed today to [facility] via community message today.

## 2018-01-05 NOTE — Addendum Note (Signed)
Addended by: Freada Bergeron on: 01/05/2018 05:07 PM   Modules accepted: Orders

## 2018-01-05 NOTE — Telephone Encounter (Signed)
-----   Message from Sueanne Margarita, MD sent at 01/03/2018  8:55 AM EDT ----- Regarding: RE: new Bipap order Please order BiPAP with heated humidity on auto from 6 to 18cm H2O and set up OV with me in 10 weeks.  Traci ----- Message ----- From: Freada Bergeron, CMA Sent: 01/01/2018   5:05 PM EDT To: Sueanne Margarita, MD Subject: new Bipap order                                 a new BiPAP unit which I will order.  Please send order with settings. Thanks, Gae Bon

## 2018-01-07 NOTE — Progress Notes (Signed)
  Radiation Oncology         [phone]) [phone] ________________________________  Name: [name] MRN: [id number]  Date: 12/17/2017  DOB: 1934-06-21  End of Treatment Note  Diagnosis:   83 y.o. 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 A boost was then given to 6 Gy/5.4 Gy using a SIB technique to yield 56 Gy to the high dose target and 50.4 Gy to the lower dose target.  Beams/energy:   IMRT / 6X Photon  Narrative: The patient tolerated radiation treatment relatively well.  Towards the end of treatment he began noticing thick saliva and some pain with swallowing but otherwise denied any significant difficulties.  Plan: The patient has completed radiation treatment. The patient will return to radiation oncology clinic for routine followup in one month. I advised them to call or return sooner if they have any questions or concerns related to their recovery or treatment.  ------------------------------------------------  Jodelle Gross, MD, PhD  This document serves as a record of services personally performed by Kyung Rudd, MD. It was created on his behalf by Rae Lips, a trained medical scribe. The creation of this record is based on the scribe's personal observations and the provider's statements to them. This document has been checked and approved by the attending provider.

## 2018-01-12 ENCOUNTER — Inpatient Hospital Stay: Payer: Medicare Other

## 2018-01-12 DIAGNOSIS — Z5111 Encounter for antineoplastic chemotherapy: Secondary | ICD-10-CM | POA: Diagnosis not present

## 2018-01-12 DIAGNOSIS — C159 Malignant neoplasm of esophagus, unspecified: Secondary | ICD-10-CM

## 2018-01-12 LAB — CBC WITH DIFFERENTIAL (CANCER CENTER ONLY)
ABS IMMATURE GRANULOCYTES: 0.01 10*3/uL (ref 0.00–0.07)
BASOS ABS: 0 10*3/uL (ref 0.0–0.1)
BASOS PCT: 1 %
EOS ABS: 0.1 10*3/uL (ref 0.0–0.5)
Eosinophils Relative: 4 %
HEMATOCRIT: 27.2 % — AB (ref 39.0–52.0)
Hemoglobin: 8.7 g/dL — ABNORMAL LOW (ref 13.0–17.0)
Immature Granulocytes: 1 %
Lymphocytes Relative: 27 %
Lymphs Abs: 0.5 10*3/uL — ABNORMAL LOW (ref 0.7–4.0)
MCH: 31.2 pg (ref 26.0–34.0)
MCHC: 32 g/dL (ref 30.0–36.0)
MCV: 97.5 fL (ref 80.0–100.0)
Monocytes Absolute: 0.3 10*3/uL (ref 0.1–1.0)
Monocytes Relative: 18 %
NEUTROS ABS: 0.8 10*3/uL — AB (ref 1.7–7.7)
NEUTROS PCT: 49 %
NRBC: 0 % (ref 0.0–0.2)
PLATELETS: 74 10*3/uL — AB (ref 150–400)
RBC: 2.79 MIL/uL — AB (ref 4.22–5.81)
RDW: 16.7 % — AB (ref 11.5–15.5)
WBC Count: 1.7 10*3/uL — ABNORMAL LOW (ref 4.0–10.5)

## 2018-01-14 ENCOUNTER — Telehealth: Payer: Self-pay | Admitting: Emergency Medicine

## 2018-01-14 NOTE — Telephone Encounter (Addendum)
Pt verbalized understanding of  This. He states he has had shaking chills 3 nights ago and had a nose bleed yesterday. Ned Card, NP made aware.   ----- Message from Owens Shark, NP sent at 01/14/2018  8:36 AM EDT ----- Please let him know the white count and platelets remain low, stable.  Call with fever, bleeding.  Follow-up as scheduled.

## 2018-01-15 NOTE — Telephone Encounter (Signed)
No evidence of PE to explain his weakness and shortness of breath, probably related to his esophageal cancer treatment, anemia and poor oral intake.

## 2018-01-18 ENCOUNTER — Other Ambulatory Visit: Payer: Self-pay

## 2018-01-18 ENCOUNTER — Encounter: Payer: Self-pay | Admitting: Radiation Oncology

## 2018-01-18 ENCOUNTER — Ambulatory Visit
Admission: RE | Admit: 2018-01-18 | Discharge: 2018-01-18 | Disposition: A | Discharge status: Home / Self Care | Payer: Medicare Other | Source: Ambulatory Visit | Attending: Radiation Oncology | Admitting: Radiation Oncology

## 2018-01-18 VITALS — BP 122/66 | HR 66 | Temp 98.3°F | Resp 20 | Ht 66.0 in | Wt 183.0 lb

## 2018-01-18 DIAGNOSIS — R040 Epistaxis: Secondary | ICD-10-CM | POA: Diagnosis not present

## 2018-01-18 DIAGNOSIS — D61818 Other pancytopenia: Secondary | ICD-10-CM | POA: Insufficient documentation

## 2018-01-18 DIAGNOSIS — Z79899 Other long term (current) drug therapy: Secondary | ICD-10-CM | POA: Insufficient documentation

## 2018-01-18 DIAGNOSIS — C154 Malignant neoplasm of middle third of esophagus: Secondary | ICD-10-CM | POA: Diagnosis not present

## 2018-01-18 NOTE — Progress Notes (Signed)
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 A boost was then given to 6 Gy/5.4 Gy using a SIB technique to yield 56 Gy to the high dose target and 50.4 Gy to the lower dose target.  Narrative:  The patient returns today for routine follow-up. The patient did well during radiotherapy and reports that his esophagitis has nearly resolved and he hasn't taken pain medicine in 4 days. He reports periodic nose bleeds and wears Cpap. He also uses Afrin OTC to help with nasal bleeds. He is scheduled to follow back up with Dr. Benay Sims next week and has been followed for pancytopenia. No other complaints are noted.                         ALLERGIES:  has No Known Allergies.  Meds: Current Outpatient Medications  Medication Sig Dispense Refill  . acetaminophen (TYLENOL) 325 MG tablet Take 650 mg by mouth 2 (two) times daily.    [name] atorvastatin (LIPITOR) 40 MG tablet Take 1 tablet (40 mg total) by mouth daily at 6 PM. 30 tablet 0  . clopidogrel (PLAVIX) 75 MG tablet Take 1 tablet (75 mg total) by mouth daily. 30 tablet 0  . HYDROcodone-acetaminophen (HYCET) 7.5-325 mg/15 ml solution Take 5 mLs by mouth every 6 (six) hours as needed for moderate pain. 180 mL 0  . losartan (COZAAR) 25 MG tablet Take 1 tablet (25 mg total) by mouth daily. 90 tablet 3  . metoprolol tartrate (LOPRESSOR) 25 MG tablet TAKE 1 TABLET BY MOUTH TWICE DAILY 180 tablet 3  . Omega-3 Fatty Acids (FISH OIL) 1200 MG CAPS Take 1,200 mg by mouth every evening.    . pantoprazole (PROTONIX) 40 MG tablet  Take 1 tablet (40 mg total) by mouth daily. 90 tablet 3  . vitamin C (ASCORBIC ACID) 500 MG tablet Take 500 mg by mouth every evening.    . nitroGLYCERIN (NITROSTAT) 0.4 MG SL tablet Place 1 tablet (0.4 mg total) under the tongue every 5 (five) minutes as needed for chest pain. (Patient not taking: Reported on 01/18/2018) 25 tablet 2   No current facility-administered medications for this encounter.     Physical Findings:  height is 5\' 6"  (1.676 m) and weight is 183 lb (83 kg). His oral temperature is 98.3 F (36.8 C). His blood pressure is 122/66 and his pulse is 66. His respiration is 20 and oxygen saturation is 97%.  Pain Assessment Pain Score: 0-No pain/10 In general this is a well appearing caucasian male in no acute distress. He's alert and oriented x4 and appropriate throughout the examination. Cardiopulmonary assessment is negative for acute distress and he exhibits normal effort.   Lab Findings: Lab Results  Component Value Date   WBC 1.7 (L) 01/12/2018   HGB 8.7 (L) 01/12/2018   HCT 27.2 (L) 01/12/2018   MCV 97.5 01/12/2018   PLT 74 (L) 01/12/2018     Radiographic Findings: Ct Angio Chest Pe W Or Wo Contrast  Result Date: 12/28/2017 CLINICAL  DATA:  History of esophageal cancer with increasing shortness of breath EXAM: CT ANGIOGRAPHY CHEST WITH CONTRAST TECHNIQUE: Multidetector CT imaging of the chest was performed using the standard protocol during bolus administration of intravenous contrast. Multiplanar CT image reconstructions and MIPs were obtained to evaluate the vascular anatomy. CONTRAST:  59mL ISOVUE-370 IOPAMIDOL (ISOVUE-370) INJECTION 76% COMPARISON:  11/02/2017 FINDINGS: Cardiovascular: The thoracic aorta demonstrates atherosclerotic changes without aneurysmal dilatation or dissection. Changes of prior coronary bypass grafting are noted. Prior TAVR is noted as well. The pulmonary artery shows a normal branching pattern bilaterally. No filling defects to suggest  pulmonary emboli are noted. Pacing device is noted. Mediastinum/Nodes: Thoracic inlet is within normal limits. No significant hilar or mediastinal adenopathy is noted. Thickening in the mid to distal esophagus is noted consistent with the patient's given clinical history of esophageal carcinoma. Lungs/Pleura: The lungs are well aerated bilaterally. Diffuse emphysematous changes are noted. No focal infiltrate or sizable effusion is seen. No definitive parenchymal nodules are identified. Upper Abdomen: Visualized upper abdomen demonstrates some nodularity to the liver which may be related to underlying cirrhosis. Musculoskeletal: Degenerative changes of the thoracic spine are seen. Review of the MIP images confirms the above findings. IMPRESSION: No evidence of pulmonary emboli. Esophageal thickening consistent with the given clinical history. Chronic changes as described above. Aortic Atherosclerosis (ICD10-I70.0) and Emphysema (ICD10-J43.9). These results will be called to the ordering clinician or representative by the Radiologist Assistant, and communication documented in the PACS or zVision Dashboard. Electronically Signed   By: [name] M.D.   On: 12/28/2017 15:11    Impression/Plan: 1. 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  The patient is doing well following radiotherapy. He will continue with medical oncology for long term follow up. He is scheduled to be seen next week as well.  2. Pancytopenia. [name] WBC, Hgb, and PLT counts are low and he will be followed for this with next weeks blood counts 3. Epistaxis. The patient is counseled on the role of saline nasal spray and to consider aquaphor to moisturize his nares.     Carola Rhine, PAC

## 2018-01-19 ENCOUNTER — Ambulatory Visit (INDEPENDENT_AMBULATORY_CARE_PROVIDER_SITE_OTHER): Payer: Medicare Other | Admitting: Cardiology

## 2018-01-19 ENCOUNTER — Encounter: Payer: Self-pay | Admitting: Cardiology

## 2018-01-19 VITALS — BP 126/60 | HR 66 | Ht 66.0 in | Wt 183.8 lb

## 2018-01-19 DIAGNOSIS — G4733 Obstructive sleep apnea (adult) (pediatric): Secondary | ICD-10-CM

## 2018-01-19 DIAGNOSIS — D649 Anemia, unspecified: Secondary | ICD-10-CM | POA: Insufficient documentation

## 2018-01-19 DIAGNOSIS — Z952 Presence of prosthetic heart valve: Secondary | ICD-10-CM

## 2018-01-19 DIAGNOSIS — I480 Paroxysmal atrial fibrillation: Secondary | ICD-10-CM

## 2018-01-19 DIAGNOSIS — Z45018 Encounter for adjustment and management of other part of cardiac pacemaker: Secondary | ICD-10-CM

## 2018-01-19 DIAGNOSIS — I2510 Atherosclerotic heart disease of native coronary artery without angina pectoris: Secondary | ICD-10-CM | POA: Diagnosis not present

## 2018-01-19 DIAGNOSIS — E785 Hyperlipidemia, unspecified: Secondary | ICD-10-CM

## 2018-01-19 DIAGNOSIS — I10 Essential (primary) hypertension: Secondary | ICD-10-CM

## 2018-01-19 NOTE — Patient Instructions (Signed)
Medication Instructions:  Your physician recommends that you continue on your current medications as directed. Please refer to the Current Medication list given to you today.  If you need a refill on your cardiac medications before your next appointment, please call your pharmacy.   Lab work: None  If you have labs (blood work) drawn today and your tests are completely normal, you will receive your results only by: [name] MyChart Message (if you have MyChart) OR . A paper copy in the mail If you have any lab test that is abnormal or we need to change your treatment, we will call you to review the results.  Testing/Procedures: None  Follow-Up: At [facility], you and your health needs are our priority.  As part of our continuing mission to provide you with exceptional heart care, we have created designated Provider Care Teams.  These Care Teams include your primary Cardiologist (physician) and Advanced Practice Providers (APPs -  Physician Assistants and Nurse Practitioners) who all work together to provide you with the care you need, when you need it. You will need a follow up appointment in 3-4 months or at his next available.  Please call our office 2 months in advance to schedule this appointment.  You may see Dr. [name] or one of the following Advanced Practice Providers on your designated Care Team:   Chanetta Marshall, NP . Tommye Standard, PA-C  Any Other Special Instructions Will Be Listed Below (If Applicable).   '

## 2018-01-19 NOTE — Progress Notes (Addendum)
Cardiology Office Note:    Date:  01/19/2018   ID:  [name], DOB 02/15/1935, MRN [id number]  PCP:  [name], MD  Cardiologist:  [name], MD  Referring MD: [name], *   Chief Complaint  Patient presents with  . Follow-up    Weakness    History of Present Illness:    [name] is a 83 y.o. male with a past medical history significant for with a past medical history significant for CAD s/p PTCA and CABG (1994,1995)nowwith chronic occlusion of SVG to RCA per cardiac cath 2017(see below),AVSs/p [id number], HTN, remote history of PAF not on anticoagulationsecondary to nosebleeds, COPD, sleep apnea on nocturnal BiPAP, remote prostate CA s/pprostatectomy and SSSs/p PPM.  Had complaints of chest pain in June and underwent a stress test which was abnormal. He therefore underwent cardiac catheterization 08/28/2017 08/28/2017 that showed known occlusion of SVG-RCA, widely patent LIMA-LAD, SVG- 1st diag, Seq SVG-RI-OM3.On 09/01/2017 Dr. Lianne Moris to open his RCA butwas unsuccessful. He continued to have complaints of chest pain and was arranged for a repeated attempt at PCI of the RCA. On 10/02/2017 he underwentsuccessful difficult PCI of ostial-proximal RCA with rotational atherectomy and DES stent placementby Dr. Ellyn Hack.Recommendation is for dual antiplatelet therapy with aspirin 81 mg and clopidogrel 75 mg long-term(beyond12 months)because of ostial stent and diffuse disease elsewhere. Dr. Ellyn Hack noted that it would be okay to stop aspirin after 3-6 months. Imdur was stopped.  He has also been found to have esophageal cancer and has just completed chemo/radiation.  He was evaluated for right leg DVT 10/28/2017 which was negative. I saw him on 12/28/17 at which time his chest pain had improved since his cath but he was back to being short of breath.  He complained of having no energy and wanting to lay around and sleep all the time.  He  had just completed chemo and radiation for esophageal cancer.  He had lost about 16 pounds over the previous 2 months.  He was in sinus tachycardia at 116 bpm.  I checked some labs and found a normal renal function and potassium.  His hemoglobin was 10.9.  TSH was normal.  His d-dimer was elevated at 1.63 however a CT of the chest was negative for PE.  He is here today alone. He finished his chemo and radiation about 3 weeks ago. He had gotten very weak but he says he is feeling a little better every day now. He is able to swallow without pain now and has been eating better in the last week. He has gained about 7-8 pounds back. Now able to walk to mailbox and back when he could not a month ago.   No chest pain/pressure, shortness of breath, orthopnea, PND, palpitations, lightheadedness or syncope.   Past Medical History:  Diagnosis Date  . AAA (abdominal aortic aneurysm) ([place])    a. Last duplex - 3.8cm 01/2013 - due 01/2014.  [name] Acne rosacea   . Alcoholic hepatitis with ascites 08/28/2017  . Anginal pain ([place]) 1996  . Aortic stenosis, mild    Last echo 05/04/12 +LVH  . Arthritis    "shoulders" (09/01/2017)  . Back pain    hx epidural injections  . Baker's cyst    Lt.  [name] CAD (coronary artery disease)    a. s/p MI/PTCA - Cx 1994. b. s/p CABG x5 in 1997. c. Abnl nuc 2003- occ SVG-RCA, patent seq SVG-OM1-dCxOM, patentl LIMA-LAD, patent, SVG-diag. d. Normal nuc 09/2012.  . Carotid  artery disease ([place])    a. Duplex 01/2013: mildly abnormal. Mild plaque without significant diameter reduction. Repeat recommended 11/15.  [name] COPD (chronic obstructive pulmonary disease) ([place])   . Dizziness and giddiness 08/04/2013  . GERD (gastroesophageal reflux disease)   . H/O: GI bleed    mild, neg. colonoscopy  . Heart murmur   . HOH (hard of hearing)   . HTN (hypertension)   . Hyperlipidemia   . Hypertensive heart disease   . Myocardial infarction ([place]) 1994  . OSA on CPAP   . OSA treated with BiPAP  11/20/2015   Severe with [facility] 38/hr  . PAF (paroxysmal atrial fibrillation) ([place])    a. Remote per records.   . Pneumonia    history  . Presence of permanent cardiac pacemaker    DOI 1999 with change out 2006; St Jude  . Prostate cancer ([place]) ~ 2010   a. s/p radical retropubic prostatectomy with bilateral pelvic lymph node dissection  . PVD (peripheral vascular disease) ([place])    a. Rt ext iliac stenosis, last PV cath 2003, moderate stenosis last Lower Ext Dopplers 12/16/11 - Rt. ABI 0.96  Lt ABI 1.0.  . Sick sinus syndrome ([place])    a. placed 1999, gen change 2011 - St. Jude.  . Sinus node dysfunction ([place]) 01/20/2013  . Syncope 11/28/97  . Thrombocytopenia ([facility])    chronic, mild  . Valvular heart disease    a. Echo 04/2012: mild-mod AS, mild AI, mild MR.    Past Surgical History:  Procedure Laterality Date  . APPENDECTOMY    . BIOPSY  10/20/2017   Procedure: BIOPSY;  Surgeon: Doran Stabler, MD;  Location: [facility];  Service: Gastroenterology;;  . CARDIAC CATHETERIZATION  2003   VG to RCA occluded, other grafts patent  . CARDIAC CATHETERIZATION N/A 05/04/2015   Procedure: Right/Left Heart Cath and Coronary/Graft Angiography;  Surgeon: Sherren Mocha, MD;  Location: [place] [facility];  Service: Cardiovascular;  Laterality: N/A;  . CARDIAC CATHETERIZATION  09/01/2017  . CARDIAC VALVE REPLACEMENT    . CATARACT EXTRACTION W/ INTRAOCULAR LENS  IMPLANT, BILATERAL Bilateral   . COLONOSCOPY    . CORONARY ANGIOPLASTY  03/29/92   PTCA to LCX  . CORONARY ARTERY BYPASS GRAFT  1997   LIMA-LAD; VG-diag; seq VG-1st OM & distal LCX; VG-RCA  . CORONARY ATHERECTOMY N/A 09/01/2017   Procedure: CORONARY ATHERECTOMY;  Surgeon: Jettie Booze, MD;  Location: [place] [facility];  Service: Cardiovascular;  Laterality: N/A;  . CORONARY ATHERECTOMY  10/01/2017  . CORONARY ATHERECTOMY N/A 10/01/2017   Procedure: CORONARY ATHERECTOMY;  Surgeon: Leonie Man, MD;  Location: [place] [facility];   Service: Cardiovascular;  Laterality: N/A;  . ESOPHAGOGASTRODUODENOSCOPY (EGD) WITH PROPOFOL N/A 10/20/2017   Procedure: ESOPHAGOGASTRODUODENOSCOPY (EGD) WITH PROPOFOL;  Surgeon: Doran Stabler, MD;  Location: [facility];  Service: Gastroenterology;  Laterality: N/A;  . INSERT / REPLACE / REMOVE PACEMAKER  11/28/1997   pacesetter--ERI 2011  . LEFT HEART CATH N/A 09/01/2017   Procedure: Left Heart Cath;  Surgeon: Jettie Booze, MD;  Location: [place] [facility];  Service: Cardiovascular;  Laterality: N/A;  . LEFT HEART CATH AND CORS/GRAFTS ANGIOGRAPHY N/A 08/28/2017   Procedure: LEFT HEART CATH AND CORS/GRAFTS ANGIOGRAPHY;  Surgeon: Leonie Man, MD;  Location: [place] [facility];  Service: Cardiovascular;  Laterality: N/A;  . PACEMAKER GENERATOR CHANGE  08/15/2009   St. Jude accent  . SUPRAPUBIC PROSTATECTOMY    . TEE  WITHOUT CARDIOVERSION N/A 06/12/2015   Procedure: TRANSESOPHAGEAL ECHOCARDIOGRAM (TEE);  Surgeon: Sherren Mocha, MD;  Location: [place];  Service: Open Heart Surgery;  Laterality: N/A;  . TONSILLECTOMY    . TRANSCATHETER AORTIC VALVE REPLACEMENT, TRANSFEMORAL N/A 06/12/2015   Procedure: TRANSCATHETER AORTIC VALVE REPLACEMENT, TRANSFEMORAL, possible transpical;  Surgeon: Sherren Mocha, MD;  Location: [place];  Service: Open Heart Surgery;  Laterality: N/A;    Current Medications: Current Meds  Medication Sig  . acetaminophen (TYLENOL) 325 MG tablet Take 650 mg by mouth 2 (two) times daily.  [name] atorvastatin (LIPITOR) 40 MG tablet Take 1 tablet (40 mg total) by mouth daily at 6 PM.  . clopidogrel (PLAVIX) 75 MG tablet Take 1 tablet (75 mg total) by mouth daily.  [name] HYDROcodone-acetaminophen (HYCET) 7.5-325 mg/15 ml solution Take 5 mLs by mouth every 6 (six) hours as needed for moderate pain.  [name] losartan (COZAAR) 25 MG tablet Take 1 tablet (25 mg total) by mouth daily.  . metoprolol tartrate (LOPRESSOR) 25 MG tablet TAKE 1 TABLET BY MOUTH TWICE DAILY  . nitroGLYCERIN  (NITROSTAT) 0.4 MG SL tablet Place 1 tablet (0.4 mg total) under the tongue every 5 (five) minutes as needed for chest pain.  . Omega-3 Fatty Acids (FISH OIL) 1200 MG CAPS Take 1,200 mg by mouth every evening.  . pantoprazole (PROTONIX) 40 MG tablet Take 1 tablet (40 mg total) by mouth daily.  . vitamin C (ASCORBIC ACID) 500 MG tablet Take 500 mg by mouth every evening.     Allergies:   Patient has no known allergies.   Social History   Socioeconomic History  . Marital status: Widowed    Spouse name: Not on file  . Number of children: 5  . Years of education: 7th  . Highest education level: Not on file  Occupational History  . Occupation: Retired  Scientific laboratory technician  . Financial resource strain: Not on file  . Food insecurity:    Worry: Not on file    Inability: Not on file  . Transportation needs:    Medical: Not on file    Non-medical: Not on file  Tobacco Use  . Smoking status: Former Smoker    Packs/day: 2.00    Years: 48.00    Pack years: 96.00    Types: Cigarettes    Last attempt to quit: 03/18/1995    Years since quitting: 22.8  . Smokeless tobacco: Never Used  Substance and Sexual Activity  . Alcohol use: No    Comment: 09/01/2017 "nothing in the last couple years"  . Drug use: Never  . Sexual activity: Not Currently  Lifestyle  . Physical activity:    Days per week: Not on file    Minutes per session: Not on file  . Stress: Not on file  Relationships  . Social connections:    Talks on phone: Not on file    Gets together: Not on file    Attends religious service: Not on file    Active member of club or organization: Not on file    Attends meetings of clubs or organizations: Not on file    Relationship status: Not on file  Other Topics Concern  . Not on file  Social History Narrative  . Not on file     Family History: [name] family history includes CAD in his unknown relative; Cancer in his brother; Colon cancer in his father; Heart Problems in his  brother and sister. ROS:   Please see the history  of present illness.     All other systems reviewed and are negative.  EKGs/Labs/Other Studies Reviewed:    The following studies were reviewed today:  Echo 08/26/2017 Study Conclusions - Left ventricle: The cavity size was normal. There was mild concentric hypertrophy. Systolic function was mildly reduced. The estimated ejection fraction was in the range of 45% to 50%. Hypokinesis of the inferoseptal myocardium; consistent with ischemia in the distribution of the right coronary artery. Hypokinesis of the apicalanteroseptal myocardium; consistent with ischemia in the distribution of the left anterior descending coronary artery; new since the study of 07/07/2016. Features are consistent with a pseudonormal left ventricular filling pattern, with concomitant abnormal relaxation and increased filling pressure (grade 2 diastolic dysfunction). - Ventricular septum: Septal motion showed abnormal function and dyssynergy. These changes are consistent with right ventricular pacing. - Aortic valve: A bioprosthesis was present and functioning normally. Valve area (VTI): 1.79 cm^2. Valve area (Vmax): 1.63 cm^2. Valve area (Vmean): 1.54 cm^2. - Mitral valve: Calcified annulus. - Left atrium: The atrium was mildly dilated. - Pulmonary arteries: PA peak pressure: 38 mm Hg (S).  Impressions: - Left ventricular systolic function has worsened since April 2018. Due to technical quality of the images and profound pacing-induced dyssynchrony, wall motion analysis is challenging. Nevertheless, there appears to be a new inferoseptal and apicoseptal wall motion abnormality.  EKG:  EKG is not ordered today.   Recent Labs: 08/25/2017: B Natriuretic Peptide 121.7 12/15/2017: ALT 32 12/28/2017: BUN 21; Creatinine, Ser 0.94; Potassium 4.0; Sodium 140; TSH 1.970 01/12/2018: Hemoglobin 8.7; Platelet Count 74   Recent  Lipid Panel    Component Value Date/Time   CHOL 120 10/22/2017 1154   TRIG 144 10/22/2017 1154   HDL 43 10/22/2017 1154   CHOLHDL 2.8 10/22/2017 1154   CHOLHDL 3.3 04/26/2015 1620   VLDL 41 (H) 04/26/2015 1620   LDLCALC 48 10/22/2017 1154    Physical Exam:    VS:  BP 126/60   Pulse 66   Ht 5\' 6"  (1.676 m)   Wt 183 lb 12.8 oz (83.4 kg)   SpO2 97%   BMI 29.67 kg/m     Wt Readings from Last 3 Encounters:  01/19/18 183 lb 12.8 oz (83.4 kg)  01/18/18 183 lb (83 kg)  12/29/17 174 lb 3.2 oz (79 kg)     Physical Exam  Constitutional: He is oriented to person, place, and time. He appears well-developed and well-nourished. No distress.  HENT:  Head: Normocephalic and atraumatic.  Neck: Normal range of motion. Neck supple. No JVD present.  Cardiovascular: Normal rate, regular rhythm, normal heart sounds and intact distal pulses. Exam reveals no gallop and no friction rub.  No murmur heard. Pulmonary/Chest: Effort normal and breath sounds normal. No respiratory distress. He has no wheezes. He has no rales.  Abdominal: Soft. Bowel sounds are normal.  Musculoskeletal: Normal range of motion. He exhibits no edema.  Neurological: He is alert and oriented to person, place, and time.  Skin: Skin is warm and dry.  Psychiatric: He has a normal mood and affect. His behavior is normal. Judgment and thought content normal.  Vitals reviewed.   ASSESSMENT:    1. CAD, multiple vessel   2. PAF (paroxysmal atrial fibrillation) ([place])   3. Essential hypertension   4. Dyslipidemia, goal LDL below 70   5. S/P TAVR (transcatheter aortic valve replacement)   6. Pacemaker -[place]   7. OSA treated with BiPAP   8. Anemia, unspecified type  PLAN:    In order of problems listed above:  CAD: s/p past CABG with known occlusion of SVG-RCA, widely patent LIMA-LAD, SVG- 1st diag, Seq SVG-RI-OM3.On 10/02/2017 he underwent successful difficult PCI of ostial-proximal RCA with rotational atherectomy  and DES stent placement.On Plavix, beta-blocker, statin.No anginal symptoms.   Paroxysmal atrial fibrillation: PPM device check on 12/16/2017 showed longest run of A. fib was 7 hours.  Patient is not anticoagulated for a history of recurrent nosebleeds requiring ED visits. Still has mild nose bleeds every few days. Pt advised to be seen by ENT, he will get a referral from his PCP. We discussed that he is not protected from stroke. Consider adding A/C after seen by ENT.  Hypertension: metoprolol 25 mg, Pt was low at last office visit on 10/14.  Maxide was switched to low-dose losartan. BP well controlled. Conitnue current therapy.   Dyslipidemia: atorvastatin 40 mgswitched from Zocor in June.  Lipid panel on 10/22/2017 showed LDL of 48 which is at goal of less than 70  S/P TAVR 2017: bioprosthetic AV functioning normally by echo in 08/2017.  Dual-chamber pacemaker:followed by Dr. [name]. Has rare V pacing per last PPM check, so tachycardia likely not related to V pacing.  YQI:HKVQ CPAP every night and is benefitting. Followed by Dr. Radford Pax.  Esophageal cancer: Now finished with chemo and radiation. No longer has esophageal pain and is able to swallow. Eating better and gaining wt.     Anemia: Hgb has trended down, 8.7 on labs 01/12/2018. Related to chemotherapy.  Followed by oncology. Pt is feeling much better since his treatemnts are complete.   Medication Adjustments/Labs and Tests Ordered: Current medicines are reviewed at length with the patient today.  Concerns regarding medicines are outlined above. Labs and tests ordered and medication changes are outlined in the patient instructions below:  Patient Instructions  Medication Instructions:  Your physician recommends that you continue on your current medications as directed. Please refer to the Current Medication list given to you today.  If you need a refill on your cardiac medications before your next appointment, please call your  pharmacy.   Lab work: None  If you have labs (blood work) drawn today and your tests are completely normal, you will receive your results only by: [name] MyChart Message (if you have MyChart) OR . A paper copy in the mail If you have any lab test that is abnormal or we need to change your treatment, we will call you to review the results.  Testing/Procedures: None  Follow-Up: At [facility], you and your health needs are our priority.  As part of our continuing mission to provide you with exceptional heart care, we have created designated Provider Care Teams.  These Care Teams include your primary Cardiologist (physician) and Advanced Practice Providers (APPs -  Physician Assistants and Nurse Practitioners) who all work together to provide you with the care you need, when you need it. You will need a follow up appointment in 3-4 months or at his next available.  Please call our office 2 months in advance to schedule this appointment.  You may see Dr. [name] or one of the following Advanced Practice Providers on your designated Care Team:   Chanetta Marshall, NP . Tommye Standard, PA-C  Any Other Special Instructions Will Be Listed Below (If Applicable).   '    Signed, Daune Perch, NP  01/19/2018 9:27 AM    [facility]

## 2018-01-27 ENCOUNTER — Telehealth: Payer: Self-pay | Admitting: Oncology

## 2018-01-27 ENCOUNTER — Inpatient Hospital Stay (HOSPITAL_BASED_OUTPATIENT_CLINIC_OR_DEPARTMENT_OTHER): Payer: Medicare Other | Admitting: Oncology

## 2018-01-27 ENCOUNTER — Inpatient Hospital Stay: Payer: Medicare Other | Attending: Oncology

## 2018-01-27 VITALS — BP 139/77 | HR 67 | Temp 98.6°F | Resp 13 | Ht 66.0 in | Wt 191.0 lb

## 2018-01-27 DIAGNOSIS — Z8546 Personal history of malignant neoplasm of prostate: Secondary | ICD-10-CM | POA: Insufficient documentation

## 2018-01-27 DIAGNOSIS — I739 Peripheral vascular disease, unspecified: Secondary | ICD-10-CM | POA: Diagnosis not present

## 2018-01-27 DIAGNOSIS — C154 Malignant neoplasm of middle third of esophagus: Secondary | ICD-10-CM

## 2018-01-27 DIAGNOSIS — M7989 Other specified soft tissue disorders: Secondary | ICD-10-CM | POA: Diagnosis not present

## 2018-01-27 DIAGNOSIS — D759 Disease of blood and blood-forming organs, unspecified: Secondary | ICD-10-CM

## 2018-01-27 DIAGNOSIS — Z950 Presence of cardiac pacemaker: Secondary | ICD-10-CM

## 2018-01-27 DIAGNOSIS — J449 Chronic obstructive pulmonary disease, unspecified: Secondary | ICD-10-CM | POA: Diagnosis not present

## 2018-01-27 DIAGNOSIS — C159 Malignant neoplasm of esophagus, unspecified: Secondary | ICD-10-CM

## 2018-01-27 DIAGNOSIS — I2510 Atherosclerotic heart disease of native coronary artery without angina pectoris: Secondary | ICD-10-CM | POA: Insufficient documentation

## 2018-01-27 LAB — CBC WITH DIFFERENTIAL (CANCER CENTER ONLY)
ABS IMMATURE GRANULOCYTES: 0.01 10*3/uL (ref 0.00–0.07)
Basophils Absolute: 0 10*3/uL (ref 0.0–0.1)
Basophils Relative: 1 %
EOS PCT: 2 %
Eosinophils Absolute: 0 10*3/uL (ref 0.0–0.5)
HEMATOCRIT: 29.4 % — AB (ref 39.0–52.0)
HEMOGLOBIN: 9.4 g/dL — AB (ref 13.0–17.0)
Immature Granulocytes: 1 %
LYMPHS ABS: 0.4 10*3/uL — AB (ref 0.7–4.0)
LYMPHS PCT: 26 %
MCH: 32.2 pg (ref 26.0–34.0)
MCHC: 32 g/dL (ref 30.0–36.0)
MCV: 100.7 fL — AB (ref 80.0–100.0)
MONO ABS: 0.3 10*3/uL (ref 0.1–1.0)
MONOS PCT: 20 %
NEUTROS ABS: 0.9 10*3/uL — AB (ref 1.7–7.7)
Neutrophils Relative %: 50 %
Platelet Count: 50 10*3/uL — ABNORMAL LOW (ref 150–400)
RBC: 2.92 MIL/uL — ABNORMAL LOW (ref 4.22–5.81)
RDW: 17.5 % — ABNORMAL HIGH (ref 11.5–15.5)
WBC Count: 1.7 10*3/uL — ABNORMAL LOW (ref 4.0–10.5)
nRBC: 0 % (ref 0.0–0.2)

## 2018-01-27 NOTE — Progress Notes (Signed)
  [place] OFFICE PROGRESS NOTE   Diagnosis: Esophagus cancer  INTERVAL HISTORY:   [name] turns as scheduled.  No dysphasia or abdominal pain.  He reports mild swelling at the lower leg and ankle bilaterally.  Objective:  Vital signs in last 24 hours:  Blood pressure 139/77, pulse 67, temperature 98.6 F (37 C), temperature source Oral, resp. rate 13, height 5\' 6"  (1.676 m), weight 191 lb (86.6 kg), SpO2 98 %.    HEENT: No thrush or ulcers Lymphatics: No cervical, supraclavicular, or axillary nodes Resp: Lungs clear bilaterally Cardio: Regular rate and rhythm GI: No hepatosplenomegaly, nontender Vascular: Trace pitting edema at the low leg bilaterally Skin: Resolving radiation rash at the lower back    Lab Results:  Lab Results  Component Value Date   WBC 1.7 (L) 01/27/2018   HGB 9.4 (L) 01/27/2018   HCT 29.4 (L) 01/27/2018   MCV 100.7 (H) 01/27/2018   PLT 50 (L) 01/27/2018   NEUTROABS 0.9 (L) 01/27/2018    CMP  Lab Results  Component Value Date   NA 140 12/28/2017   K 4.0 12/28/2017   CL 101 12/28/2017   CO2 22 12/28/2017   GLUCOSE 103 (H) 12/28/2017   BUN 21 12/28/2017   CREATININE 0.94 12/28/2017   CALCIUM 9.1 12/28/2017   PROT 6.6 12/15/2017   ALBUMIN 3.1 (L) 12/15/2017   AST 40 12/15/2017   ALT 32 12/15/2017   ALKPHOS 120 12/15/2017   BILITOT 1.1 12/15/2017   GFRNONAA 76 12/28/2017   GFRAA 88 12/28/2017    No results found for: CEA1   Medications: I have reviewed the patient's current medications.   Assessment/Plan: 1. 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 Staging CTs 10/26/2017-asymmetric esophageal thickening, no evidence of metastatic disease ? Endoscopic biopsy 10/20/2017-mid esophagus partially obstructing mass, biopsy confirmed squamous cell carcinoma ? PET scan 11/02/2017-wall thickening in the mid esophagus SUV 27 MA: No findings specific for metastatic disease ? Radiation  11/09/2017-12/17/2017 ? Cycle 1 weekly Taxol/carboplatin 11/10/2017 ? Cycle 2 weekly Taxol/carboplatin 11/17/2017 (carboplatin held due to thrombocytopenia) ? Cycle 3 weekly Taxol/carboplatin 12/08/2017 (dosesreduced due to neutropenia, thrombocytopenia) ? Cycle 4 weekly Taxol/carboplatin 12/15/2017 2. Solid dysphagia and odynophagia secondary to #1 3. Coronary artery disease 4. History of prostate cancer 5. COPD 6. Permanent cardiac pacemaker 7. Peripheral vascular disease 8. Chronic thrombocytopenia 9. 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 CT chest 12/28/2017- no evidence of pulmonary emboli.  Esophageal thickening consistent with patient's history of esophageal carcinoma. 11. Probable cirrhosis    Disposition: [name] completed chemotherapy and radiation for treatment of esophagus cancer.  He is in clinical remission from esophagus cancer. The persistent cytopenias and leg edema may be related to cirrhosis.  I will refer him to Dr. Loletha Carrow to evaluate [name] for cirrhosis.  He will return for an office visit and CBC in 1 month. Betsy Coder, MD  01/27/2018  12:15 PM

## 2018-01-27 NOTE — Telephone Encounter (Signed)
Scheduled appt per 11/13 los - gave pt AVS and calender per los.

## 2018-01-28 ENCOUNTER — Ambulatory Visit: Payer: Medicare Other | Admitting: Oncology

## 2018-01-28 ENCOUNTER — Other Ambulatory Visit: Payer: Medicare Other

## 2018-02-15 ENCOUNTER — Ambulatory Visit: Payer: Medicare Other | Admitting: Cardiology

## 2018-02-15 ENCOUNTER — Encounter (INDEPENDENT_AMBULATORY_CARE_PROVIDER_SITE_OTHER): Payer: Self-pay

## 2018-02-15 ENCOUNTER — Ambulatory Visit (HOSPITAL_COMMUNITY)
Admission: RE | Admit: 2018-02-15 | Discharge: 2018-02-15 | Disposition: A | Discharge status: Home / Self Care | Payer: Medicare Other | Source: Ambulatory Visit | Attending: Cardiology | Admitting: Cardiology

## 2018-02-15 DIAGNOSIS — I714 Abdominal aortic aneurysm, without rupture, unspecified: Secondary | ICD-10-CM

## 2018-02-17 ENCOUNTER — Telehealth: Payer: Self-pay | Admitting: Cardiology

## 2018-02-17 NOTE — Telephone Encounter (Signed)
Follow up: ° ° °Patient returning call back concerning results. °

## 2018-02-19 ENCOUNTER — Ambulatory Visit (INDEPENDENT_AMBULATORY_CARE_PROVIDER_SITE_OTHER): Payer: Medicare Other | Admitting: Cardiovascular Disease

## 2018-02-19 ENCOUNTER — Encounter: Payer: Self-pay | Admitting: Cardiovascular Disease

## 2018-02-19 VITALS — BP 130/77 | HR 95 | Ht 67.0 in | Wt 184.0 lb

## 2018-02-19 DIAGNOSIS — I25810 Atherosclerosis of coronary artery bypass graft(s) without angina pectoris: Secondary | ICD-10-CM

## 2018-02-19 DIAGNOSIS — I714 Abdominal aortic aneurysm, without rupture, unspecified: Secondary | ICD-10-CM

## 2018-02-19 DIAGNOSIS — Z952 Presence of prosthetic heart valve: Secondary | ICD-10-CM

## 2018-02-19 DIAGNOSIS — C154 Malignant neoplasm of middle third of esophagus: Secondary | ICD-10-CM

## 2018-02-19 DIAGNOSIS — E7800 Pure hypercholesterolemia, unspecified: Secondary | ICD-10-CM

## 2018-02-19 DIAGNOSIS — I480 Paroxysmal atrial fibrillation: Secondary | ICD-10-CM

## 2018-02-19 DIAGNOSIS — I495 Sick sinus syndrome: Secondary | ICD-10-CM | POA: Diagnosis not present

## 2018-02-19 DIAGNOSIS — I10 Essential (primary) hypertension: Secondary | ICD-10-CM

## 2018-02-19 DIAGNOSIS — R072 Precordial pain: Secondary | ICD-10-CM | POA: Diagnosis not present

## 2018-02-19 DIAGNOSIS — I739 Peripheral vascular disease, unspecified: Secondary | ICD-10-CM

## 2018-02-19 DIAGNOSIS — Z950 Presence of cardiac pacemaker: Secondary | ICD-10-CM

## 2018-02-19 DIAGNOSIS — G4733 Obstructive sleep apnea (adult) (pediatric): Secondary | ICD-10-CM

## 2018-02-19 NOTE — Patient Instructions (Signed)
Medication Instructions:  Dr Sallyanne Kuster recommends that you continue on your current medications as directed. Please refer to the Current Medication list given to you today.  If you need a refill on your cardiac medications before your next appointment, please call your pharmacy.   Testing/Procedures: Your physician has ordered a Lexiscan Myocardial Perfusion Imaging Study.  Please arrive 15 minutes prior to your appointment time for registration and insurance purposes.   The test will take approximately 3 to 4 hours to complete; you may bring reading material.  If someone comes with you to your appointment, they will need to remain in the main lobby due to limited space in the testing area. **If you are pregnant or breastfeeding, please notify the nuclear lab prior to your appointment**   How to prepare for your Myocardial Perfusion Test:  Do not eat or drink 3 hours prior to your test, except you may have water.  Do not consume products containing caffeine (regular or decaffeinated) 12 hours prior to your test. (ex: coffee, chocolate, sodas, tea).  Do wear comfortable clothes (no dresses or overalls) and walking shoes, tennis shoes preferred (No heels or open toe shoes are allowed).  Do NOT wear cologne, perfume, aftershave, or lotions (deodorant is allowed).  If you use an inhaler, use it the AM of your test and bring it with you.   If you use a nebulizer, use it the AM of your test.   If these instructions are not followed, your test will have to be rescheduled.  Follow-Up: Your physician recommends that you schedule a follow-up appointment in 3 months with a NP/PA.  Dr Sallyanne Kuster recommends that you schedule a follow-up appointment in June 2020.

## 2018-02-19 NOTE — Progress Notes (Signed)
Patient ID: [name], male   DOB: [birth year], 83 y.o.   MRN: [id number]    Cardiology Office Note    Date:  02/19/2018   ID:  [name], DOB 02/12/1935, MRN [id number]  PCP:  [name], MD  Cardiologist:   Sanda Klein, MD   Chief Complaint  Patient presents with  . Follow-up    History of Present Illness:  [name] is a 83 y.o. male with a long-standing history of cardiac problems.  He has been seeing Dr. [name] for his problems with sinus bradycardia and pacemaker management, following Dr. Lowella Fairy retirement.  He has recently had some pain in his epigastric/low retrosternal area that woke him from sleep.  He did not take nitroglycerin, but his symptoms resolved in a few minutes after he took hydrocodone.  This is similar to the pain that he had when his esophageal cancer was diagnosed, but this time he does not have dysphagia.  In fact he is eating well and gaining weight.  He describes remote angina as feeling the same way. He underwent chemotherapy and radiation therapy for esophageal cancer and completed treatment about 2 months ago. He finds it difficult to distinguish esophageal pain from angina.  He has a history of aortic stenosis and underwent TAVR in 2017 (Edwards Sapien 3 29 mm).  Most recently evaluated by echo in June 2019 with normal prosthetic valve function.  He has a long history of coronary disease with acute MI and left circumflex angioplasty in 1994, bypass surgery in 1997, most recently rotational atherectomy and drug-eluting stent to the ostium of the right coronary artery October 02, 2017 (patent LIMA to LAD, SVG to diagonal, sequential SVG to ramus and OM 3, occluded SVG to RCA).  He has a small infrarenal abdominal aortic aneurysm, but the most recent ultrasound, just performed shows increased to 4.4 cm.  He has a chronic left bundle branch block.  Mr. Millea presented with an acute myocardial infarction in 1994 and eventually underwent 5  vessel bypass in 1997. Repeat angiography in 2003 showed interval occlusion of the saphenous vein graft to the right coronary artery but the other bypasses were widely patent (LIMA-LAD, SVG diagonal, sequential SVG to ramus-distal circumflex). He had a normal nuclear perfusion study in 2014.  In June 2019, his nuclear stress test showed a fixed scar in the inferoseptal wall with a small adjacent area of inducible ischemia, EF 47%.  He underwent cardiac catheterization with failed initial attempt at revascularization, but then had successful rotational atherectomy with placement of a synergy 324 drug-eluting stent (Dr. Ellyn Hack).  He has an infrarenal abdominal aortic aneurysm but most recently measured 4.4 cm on duplex ultrasound February 15, 2018.  He has a history of right external iliac artery stenosis but with normal ABIs.  He has a dual-chamber St. Jude Accent RF permanent pacemaker implanted for symptomatic sinus bradycardia, last checked on January 09, 2018 and followed by Dr. [name].  Device function was normal, with only 14% atrial pacing and 2% ventricular pacing.  He had several episodes of atrial fibrillation, the longest one lasting for 7 hours.  He is currently receiving clopidogrel, but is not on anticoagulants.  His next follow-up with Dr. [name] is in clinic on December 20.  Additional problems include systemic hypertension and obstructive sleep apnea on CPAP.  Past Medical History:  Diagnosis Date  . AAA (abdominal aortic aneurysm) ([place])    a. Last duplex - 3.8cm 01/2013 - due 01/2014.  [name] Acne rosacea   .  Alcoholic hepatitis with ascites 08/28/2017  . Anginal pain ([place]) 1996  . Aortic stenosis, mild    Last echo 05/04/12 +LVH  . Arthritis    "shoulders" (09/01/2017)  . Back pain    hx epidural injections  . Baker's cyst    Lt.  [name] CAD (coronary artery disease)    a. s/p MI/PTCA - Cx 1994. b. s/p CABG x5 in 1997. c. Abnl nuc 2003- occ SVG-RCA, patent seq SVG-OM1-dCxOM, patentl  LIMA-LAD, patent, SVG-diag. d. Normal nuc 09/2012.  . Carotid artery disease ([place])    a. Duplex 01/2013: mildly abnormal. Mild plaque without significant diameter reduction. Repeat recommended 11/15.  [name] COPD (chronic obstructive pulmonary disease) ([place])   . Dizziness and giddiness 08/04/2013  . GERD (gastroesophageal reflux disease)   . H/O: GI bleed    mild, neg. colonoscopy  . Heart murmur   . HOH (hard of hearing)   . HTN (hypertension)   . Hyperlipidemia   . Hypertensive heart disease   . Myocardial infarction ([place]) 1994  . OSA on CPAP   . OSA treated with BiPAP 11/20/2015   Severe with [facility] 38/hr  . PAF (paroxysmal atrial fibrillation) ([place])    a. Remote per records.   . Pneumonia    history  . Presence of permanent cardiac pacemaker    DOI 1999 with change out 2006; St Jude  . Prostate cancer ([place]) ~ 2010   a. s/p radical retropubic prostatectomy with bilateral pelvic lymph node dissection  . PVD (peripheral vascular disease) ([place])    a. Rt ext iliac stenosis, last PV cath 2003, moderate stenosis last Lower Ext Dopplers 12/16/11 - Rt. ABI 0.96  Lt ABI 1.0.  . Sick sinus syndrome ([place])    a. placed 1999, gen change 2011 - St. Jude.  . Sinus node dysfunction ([place]) 01/20/2013  . Syncope 11/28/97  . Thrombocytopenia (H[facility])    chronic, mild  . Valvular heart disease    a. Echo 04/2012: mild-mod AS, mild AI, mild MR.    Past Surgical History:  Procedure Laterality Date  . APPENDECTOMY    . BIOPSY  10/20/2017   Procedure: BIOPSY;  Surgeon: Doran Stabler, MD;  Location: [facility];  Service: Gastroenterology;;  . CARDIAC CATHETERIZATION  2003   VG to RCA occluded, other grafts patent  . CARDIAC CATHETERIZATION N/A 05/04/2015   Procedure: Right/Left Heart Cath and Coronary/Graft Angiography;  Surgeon: Sherren Mocha, MD;  Location: [facility];  Service: Cardiovascular;  Laterality: N/A;  . CARDIAC CATHETERIZATION  09/01/2017  . CARDIAC VALVE REPLACEMENT    . CATARACT  EXTRACTION W/ INTRAOCULAR LENS  IMPLANT, BILATERAL Bilateral   . COLONOSCOPY    . CORONARY ANGIOPLASTY  03/29/92   PTCA to LCX  . CORONARY ARTERY BYPASS GRAFT  1997   LIMA-LAD; VG-diag; seq VG-1st OM & distal LCX; VG-RCA  . CORONARY ATHERECTOMY N/A 09/01/2017   Procedure: CORONARY ATHERECTOMY;  Surgeon: Jettie Booze, MD;  Location: [facility];  Service: Cardiovascular;  Laterality: N/A;  . CORONARY ATHERECTOMY  10/01/2017  . CORONARY ATHERECTOMY N/A 10/01/2017   Procedure: CORONARY ATHERECTOMY;  Surgeon: Leonie Man, MD;  Location: [facility];  Service: Cardiovascular;  Laterality: N/A;  . ESOPHAGOGASTRODUODENOSCOPY (EGD) WITH PROPOFOL N/A 10/20/2017   Procedure: ESOPHAGOGASTRODUODENOSCOPY (EGD) WITH PROPOFOL;  Surgeon: Doran Stabler, MD;  Location: [facility];  Service: Gastroenterology;  Laterality: N/A;  . INSERT / REPLACE / REMOVE PACEMAKER  11/28/1997   pacesetter--ERI  2011  . LEFT HEART CATH N/A 09/01/2017   Procedure: Left Heart Cath;  Surgeon: Jettie Booze, MD;  Location: [place] [facility];  Service: Cardiovascular;  Laterality: N/A;  . LEFT HEART CATH AND CORS/GRAFTS ANGIOGRAPHY N/A 08/28/2017   Procedure: LEFT HEART CATH AND CORS/GRAFTS ANGIOGRAPHY;  Surgeon: Leonie Man, MD;  Location: [facility];  Service: Cardiovascular;  Laterality: N/A;  . PACEMAKER GENERATOR CHANGE  08/15/2009   St. Jude accent  . SUPRAPUBIC PROSTATECTOMY    . TEE WITHOUT CARDIOVERSION N/A 06/12/2015   Procedure: TRANSESOPHAGEAL ECHOCARDIOGRAM (TEE);  Surgeon: Sherren Mocha, MD;  Location: [facility];  Service: Open Heart Surgery;  Laterality: N/A;  . TONSILLECTOMY    . TRANSCATHETER AORTIC VALVE REPLACEMENT, TRANSFEMORAL N/A 06/12/2015   Procedure: TRANSCATHETER AORTIC VALVE REPLACEMENT, TRANSFEMORAL, possible transpical;  Surgeon: Sherren Mocha, MD;  Location: [facility];  Service: Open Heart Surgery;  Laterality: N/A;    Outpatient Medications Prior to Visit    Medication Sig Dispense Refill  . acetaminophen (TYLENOL) 325 MG tablet Take 650 mg by mouth 2 (two) times daily.    [name] atorvastatin (LIPITOR) 40 MG tablet Take 1 tablet (40 mg total) by mouth daily at 6 PM. 30 tablet 0  . clopidogrel (PLAVIX) 75 MG tablet Take 1 tablet (75 mg total) by mouth daily. 30 tablet 0  . HYDROcodone-acetaminophen (HYCET) 7.5-325 mg/15 ml solution Take 5 mLs by mouth every 6 (six) hours as needed for moderate pain. 180 mL 0  . losartan (COZAAR) 25 MG tablet Take 1 tablet (25 mg total) by mouth daily. 90 tablet 3  . metoprolol tartrate (LOPRESSOR) 25 MG tablet TAKE 1 TABLET BY MOUTH TWICE DAILY 180 tablet 3  . nitroGLYCERIN (NITROSTAT) 0.4 MG SL tablet Place 1 tablet (0.4 mg total) under the tongue every 5 (five) minutes as needed for chest pain. 25 tablet 2  . Omega-3 Fatty Acids (FISH OIL) 1200 MG CAPS Take 1,200 mg by mouth every evening.    . pantoprazole (PROTONIX) 40 MG tablet Take 1 tablet (40 mg total) by mouth daily. 90 tablet 3  . vitamin C (ASCORBIC ACID) 500 MG tablet Take 500 mg by mouth every evening.     No facility-administered medications prior to visit.      Allergies:   Patient has no known allergies.   Social History   Socioeconomic History  . Marital status: Widowed    Spouse name: Not on file  . Number of children: 5  . Years of education: 7th  . Highest education level: Not on file  Occupational History  . Occupation: Retired  Scientific laboratory technician  . Financial resource strain: Not on file  . Food insecurity:    Worry: Not on file    Inability: Not on file  . Transportation needs:    Medical: Not on file    Non-medical: Not on file  Tobacco Use  . Smoking status: Former Smoker    Packs/day: 2.00    Years: 48.00    Pack years: 96.00    Types: Cigarettes    Last attempt to quit: 03/18/1995    Years since quitting: 22.9  . Smokeless tobacco: Never Used  Substance and Sexual Activity  . Alcohol use: No    Comment: 09/01/2017 "nothing in  the last couple years"  . Drug use: Never  . Sexual activity: Not Currently  Lifestyle  . Physical activity:    Days per week: Not on file    Minutes per session:  Not on file  . Stress: Not on file  Relationships  . Social connections:    Talks on phone: Not on file    Gets together: Not on file    Attends religious service: Not on file    Active member of club or organization: Not on file    Attends meetings of clubs or organizations: Not on file    Relationship status: Not on file  Other Topics Concern  . Not on file  Social History Narrative  . Not on file     Family History:  [name] family history includes CAD in his unknown relative; Cancer in his brother; Colon cancer in his father; Heart Problems in his brother and sister.   ROS:   Please see the history of present illness.    ROS All other systems reviewed and are negative.   PHYSICAL EXAM:   VS:  BP 130/77   Pulse 95   Ht 5\' 7"  (1.702 m)   Wt 184 lb (83.5 kg)   BMI 28.82 kg/m     General: Alert, oriented x3, no distress, mildly overweight.  Healthy left subclavian pacemaker site. Head: no evidence of trauma, PERRL, EOMI, no exophtalmos or lid lag, no myxedema, no xanthelasma; normal ears, nose and oropharynx Neck: normal jugular venous pulsations and no hepatojugular reflux; brisk carotid pulses without delay and no carotid bruits Chest: clear to auscultation, no signs of consolidation by percussion or palpation, normal fremitus, symmetrical and full respiratory excursions Cardiovascular: normal position and quality of the apical impulse, regular rhythm, normal first and paradoxically split second heart sounds, no murmurs, rubs or gallops Abdomen: no tenderness or distention, no masses by palpation, no abnormal pulsatility or arterial bruits, normal bowel sounds, no hepatosplenomegaly Extremities: no clubbing, cyanosis or edema; 2+ radial, ulnar and brachial pulses bilaterally; 2+ right femoral, posterior  tibial and dorsalis pedis pulses; 2+ left femoral, posterior tibial and dorsalis pedis pulses; no subclavian or femoral bruits Neurological: grossly nonfocal Psych: Normal mood and affect   Wt Readings from Last 3 Encounters:  02/19/18 184 lb (83.5 kg)  01/27/18 191 lb (86.6 kg)  01/19/18 183 lb 12.8 oz (83.4 kg)      Studies/Labs Reviewed:   EKG:  EKG is ordered today.  It shows sinus rhythm and left bundle branch block with a single PVC  Recent Labs: 08/25/2017: B Natriuretic Peptide 121.7 12/15/2017: ALT 32 12/28/2017: BUN 21; Creatinine, Ser 0.94; Potassium 4.0; Sodium 140; TSH 1.970 01/27/2018: Hemoglobin 9.4; Platelet Count 50   Lipid Panel    Component Value Date/Time   CHOL 120 10/22/2017 1154   TRIG 144 10/22/2017 1154   HDL 43 10/22/2017 1154   CHOLHDL 2.8 10/22/2017 1154   CHOLHDL 3.3 04/26/2015 1620   VLDL 41 (H) 04/26/2015 1620   LDLCALC 48 10/22/2017 1154    Additional studies/ records that were reviewed today include:  Last cath report, notes from Dr. [name]    ASSESSMENT:    1. Moderate to severe aortic stenosis   2. Coronary artery disease involving native coronary artery of native heart without angina pectoris   3. Sinus node dysfunction (HCC)   4. Pacemaker -Gorman   5. HTN  6. Hyperlipidemia   7. PAD/AAA   PLAN:  In order of problems listed above:  1. AS s/p TAVR: Normal prosthesis (Edwards Sapien 3, 29 mm) by echo June 2019, mean gradient 13 mmHg 2. CAD s/p CABG: Very challenging to say whether his current chest pain syndrome  represents angina or symptoms related to his esophageal disease.  ECG is nondiagnostic due to left bundle branch block.  Overall I favor the latter since the symptoms are not exertional and resolved with a single dose of opiate analgesic.  On the other hand he is within the target range for possible in-stent restenosis following the ostial right coronary artery stent performed in June.  We will schedule for nuclear stress  test.. He is on statin, clopidogrel and beta blocker. 3. SSS s/p PPM: He actually only requires infrequent atrial pacing and never has ventricular pacing.  4. PAFib: Atrial fibrillation has been reported on his pacemaker, but is asymptomatic.  The overall burden is very low, less than 1%, but episodes in excess of 7 hours have been recorded.  With his recent treatment for esophageal cancer and need for antiplatelet therapy, I do not think it is wise to start him on an anticoagulant as well.  When he can stop taking clopidogrel, we would probably switch him to Eliquis. 5. PM: Normally functioning dual-chamber permanent pacemaker, his device is followed by Dr. [name]. 6. HTN: Well-controlled 7. HLP: Excellent recent lipid profile with LDL cholesterol well under 70. 8. PAD/AAA: right external iliac artery stenosis and moderate fusiform infrarenal abdominal aortic aneurysm.  The aneurysm has shown a recent increase in size and will start taking it every 6 months.  It is not symptomatic and is not expected to reach surgical range for a while.  He is taking beta-blockers. 9. Esophageal cancer status post radiation and chemotherapy.  Oncology appointment on December 13 and gastroenterology appointment on December 17 with Dr. Loletha Carrow.  Liver nodularity was described on a chest CT performed in October raising concerns for possible cirrhosis.  He asked about this.  I asked him to discuss it with Dr. Loletha Carrow.  He does not have signs or symptoms of either oral hypertension or parenchymal insufficiency.  Most recent liver function tests from October did show a mildly decreased albumin at 3.1, but he was not eating well during treatment for esophageal cancer. 10. OSA: Reports compliance with CPAP.  He is no longer obese following weight loss during treatment for esophageal cancer.  He has an office appointment with Dr. Radford Pax on January 9.    Medication Adjustments/Labs and Tests Ordered: Current medicines are reviewed at  length with the patient today.  Concerns regarding medicines are outlined above.  Medication changes, Labs and Tests ordered today are listed in the Patient Instructions below. Patient Instructions  Medication Instructions:  Dr Sallyanne Kuster recommends that you continue on your current medications as directed. Please refer to the Current Medication list given to you today.  If you need a refill on your cardiac medications before your next appointment, please call your pharmacy.   Testing/Procedures: Your physician has ordered a Lexiscan Myocardial Perfusion Imaging Study.  Please arrive 15 minutes prior to your appointment time for registration and insurance purposes.   The test will take approximately 3 to 4 hours to complete; you may bring reading material.  If someone comes with you to your appointment, they will need to remain in the main lobby due to limited space in the testing area. **If you are pregnant or breastfeeding, please notify the nuclear lab prior to your appointment**   How to prepare for your Myocardial Perfusion Test:  Do not eat or drink 3 hours prior to your test, except you may have water.  Do not consume products containing caffeine (regular or decaffeinated) 12 hours prior  to your test. (ex: coffee, chocolate, sodas, tea).  Do wear comfortable clothes (no dresses or overalls) and walking shoes, tennis shoes preferred (No heels or open toe shoes are allowed).  Do NOT wear cologne, perfume, aftershave, or lotions (deodorant is allowed).  If you use an inhaler, use it the AM of your test and bring it with you.   If you use a nebulizer, use it the AM of your test.   If these instructions are not followed, your test will have to be rescheduled.  Follow-Up: Your physician recommends that you schedule a follow-up appointment in 3 months with a NP/PA.  Dr Sallyanne Kuster recommends that you schedule a follow-up appointment in June 2020.      Signed, Sanda Klein, MD    02/19/2018 3:45 PM    [facility] [address], [place], [place]  [address] Phone: [phone]; Fax: [phone]

## 2018-02-26 ENCOUNTER — Inpatient Hospital Stay: Payer: Medicare Other

## 2018-02-26 ENCOUNTER — Encounter: Payer: Self-pay | Admitting: Nurse Practitioner

## 2018-02-26 ENCOUNTER — Inpatient Hospital Stay: Payer: Medicare Other | Attending: Oncology | Admitting: Nurse Practitioner

## 2018-02-26 ENCOUNTER — Telehealth (HOSPITAL_COMMUNITY): Payer: Self-pay

## 2018-02-26 ENCOUNTER — Telehealth: Payer: Self-pay

## 2018-02-26 VITALS — BP 140/76 | HR 68 | Temp 98.5°F | Resp 20 | Ht 67.0 in | Wt 183.4 lb

## 2018-02-26 DIAGNOSIS — J449 Chronic obstructive pulmonary disease, unspecified: Secondary | ICD-10-CM | POA: Diagnosis not present

## 2018-02-26 DIAGNOSIS — D61818 Other pancytopenia: Secondary | ICD-10-CM | POA: Diagnosis not present

## 2018-02-26 DIAGNOSIS — I2510 Atherosclerotic heart disease of native coronary artery without angina pectoris: Secondary | ICD-10-CM | POA: Diagnosis not present

## 2018-02-26 DIAGNOSIS — D6959 Other secondary thrombocytopenia: Secondary | ICD-10-CM | POA: Diagnosis not present

## 2018-02-26 DIAGNOSIS — D701 Agranulocytosis secondary to cancer chemotherapy: Secondary | ICD-10-CM | POA: Diagnosis not present

## 2018-02-26 DIAGNOSIS — I739 Peripheral vascular disease, unspecified: Secondary | ICD-10-CM | POA: Insufficient documentation

## 2018-02-26 DIAGNOSIS — Z950 Presence of cardiac pacemaker: Secondary | ICD-10-CM | POA: Insufficient documentation

## 2018-02-26 DIAGNOSIS — C159 Malignant neoplasm of esophagus, unspecified: Secondary | ICD-10-CM

## 2018-02-26 DIAGNOSIS — C154 Malignant neoplasm of middle third of esophagus: Secondary | ICD-10-CM

## 2018-02-26 LAB — CBC WITH DIFFERENTIAL (CANCER CENTER ONLY)
ABS IMMATURE GRANULOCYTES: 0 10*3/uL (ref 0.00–0.07)
BASOS PCT: 0 %
Basophils Absolute: 0 10*3/uL (ref 0.0–0.1)
EOS PCT: 2 %
Eosinophils Absolute: 0 10*3/uL (ref 0.0–0.5)
HCT: 31.2 % — ABNORMAL LOW (ref 39.0–52.0)
HEMOGLOBIN: 10.4 g/dL — AB (ref 13.0–17.0)
Immature Granulocytes: 0 %
LYMPHS PCT: 21 %
Lymphs Abs: 0.5 10*3/uL — ABNORMAL LOW (ref 0.7–4.0)
MCH: 33.2 pg (ref 26.0–34.0)
MCHC: 33.3 g/dL (ref 30.0–36.0)
MCV: 99.7 fL (ref 80.0–100.0)
MONO ABS: 0.5 10*3/uL (ref 0.1–1.0)
MONOS PCT: 22 %
NEUTROS ABS: 1.3 10*3/uL — AB (ref 1.7–7.7)
Neutrophils Relative %: 55 %
Platelet Count: 53 10*3/uL — ABNORMAL LOW (ref 150–400)
RBC: 3.13 MIL/uL — ABNORMAL LOW (ref 4.22–5.81)
RDW: 13.8 % (ref 11.5–15.5)
WBC Count: 2.3 10*3/uL — ABNORMAL LOW (ref 4.0–10.5)
nRBC: 0 % (ref 0.0–0.2)

## 2018-02-26 NOTE — Telephone Encounter (Signed)
Encounter complete. 

## 2018-02-26 NOTE — Telephone Encounter (Signed)
Printed avs and calender of upcoming appointment. Per 12/13 los  

## 2018-02-26 NOTE — Progress Notes (Signed)
  [facility] OFFICE PROGRESS NOTE   Diagnosis: Esophagus cancer  INTERVAL HISTORY:   Mr. Sabine returns as scheduled.  He is feeling stronger.  He denies dysphagia.  He has 2-3 episodes a day of "heartburn".  The discomfort lasts for a few seconds.  He denies significant pain.  Appetite is better.  Taste has improved.  He denies bleeding.  Objective:  Vital signs in last 24 hours:  Blood pressure 140/76, pulse 68, temperature 98.5 F (36.9 C), temperature source Oral, resp. rate 20, height 5\' 7"  (1.702 m), weight 183 lb 6.4 oz (83.2 kg), SpO2 96 %.    HEENT: No thrush or ulcers. Lymphatics: No palpable cervical or supraclavicular lymph nodes. Resp: Distant breath sounds.  No respiratory distress. Cardio: Regular rate and rhythm. GI: Abdomen soft and nontender.  No hepatosplenomegaly. Vascular: No leg edema.   Lab Results:  Lab Results  Component Value Date   WBC 2.3 (L) 02/26/2018   HGB 10.4 (L) 02/26/2018   HCT 31.2 (L) 02/26/2018   MCV 99.7 02/26/2018   PLT 53 (L) 02/26/2018   NEUTROABS 1.3 (L) 02/26/2018    Imaging:  No results found.  Medications: I have reviewed the patient's current medications.  Assessment/Plan: 1. 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 Staging CTs 10/26/2017-asymmetric esophageal thickening, no evidence of metastatic disease ? Endoscopic biopsy 10/20/2017-mid esophagus partially obstructing mass, biopsy confirmed squamous cell carcinoma ? PET scan 11/02/2017-wall thickening in the mid esophagus SUV 27 MA: No findings specific for metastatic disease ? Radiation 11/09/2017-12/17/2017 ? Cycle 1 weekly Taxol/carboplatin 11/10/2017 ? Cycle 2 weekly Taxol/carboplatin 11/17/2017 (carboplatin held due to thrombocytopenia) ? Cycle 3 weekly Taxol/carboplatin 12/08/2017 (dosesreduced due to neutropenia, thrombocytopenia) ? Cycle 4 weekly Taxol/carboplatin 12/15/2017 2. Solid dysphagia and odynophagia secondary to  #1 3. Coronary artery disease 4. History of prostate cancer 5. COPD 6. Permanent cardiac pacemaker 7. Peripheral vascular disease 8. Chronic thrombocytopenia 9. 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 CT chest 12/28/2017- no evidence of pulmonary emboli. Esophageal thickening consistent with patient's history of esophageal carcinoma. 11. Probable cirrhosis, referred to GI   Disposition: [name] appears stable.  He remains in clinical remission from esophagus cancer.  We made a referral to Dr. Loletha Carrow to consider a restaging upper endoscopy.  He has persistent pancytopenia.  This may be related to cirrhosis.  He has an appointment scheduled with Dr. Loletha Carrow next week to evaluate for cirrhosis.  He will return for lab and follow-up in 2 months.  Plan reviewed with Dr. Benay Spice.    Ned Card ANP/GNP-BC   02/26/2018  1:58 PM

## 2018-03-02 ENCOUNTER — Other Ambulatory Visit (INDEPENDENT_AMBULATORY_CARE_PROVIDER_SITE_OTHER): Payer: Medicare Other

## 2018-03-02 ENCOUNTER — Encounter: Payer: Self-pay | Admitting: Gastroenterology

## 2018-03-02 ENCOUNTER — Ambulatory Visit (INDEPENDENT_AMBULATORY_CARE_PROVIDER_SITE_OTHER): Payer: Medicare Other | Admitting: Gastroenterology

## 2018-03-02 VITALS — BP 120/60 | HR 70 | Ht 67.0 in | Wt 184.0 lb

## 2018-03-02 DIAGNOSIS — C159 Malignant neoplasm of esophagus, unspecified: Secondary | ICD-10-CM

## 2018-03-02 DIAGNOSIS — T451X5A Adverse effect of antineoplastic and immunosuppressive drugs, initial encounter: Secondary | ICD-10-CM

## 2018-03-02 DIAGNOSIS — D61810 Antineoplastic chemotherapy induced pancytopenia: Secondary | ICD-10-CM

## 2018-03-02 DIAGNOSIS — R933 Abnormal findings on diagnostic imaging of other parts of digestive tract: Secondary | ICD-10-CM | POA: Diagnosis not present

## 2018-03-02 LAB — PROTIME-INR
INR: 1.2 ratio — ABNORMAL HIGH (ref 0.8–1.0)
Prothrombin Time: 13.6 s — ABNORMAL HIGH (ref 9.6–13.1)

## 2018-03-02 NOTE — Patient Instructions (Signed)
If you are age 81 or older, your body mass index should be between 23-30. Your Body mass index is 28.82 kg/m. If this is out of the aforementioned range listed, please consider follow up with your Primary Care Provider.  If you are age 73 or younger, your body mass index should be between 19-25. Your Body mass index is 28.82 kg/m. If this is out of the aformentioned range listed, please consider follow up with your Primary Care Provider.   You have been scheduled for an endoscopy. Please follow written instructions given to you at your visit today. If you use inhalers (even only as needed), please bring them with you on the day of your procedure. Your physician has requested that you go to www.startemmi.com and enter the access code given to you at your visit today. This web site gives a general overview about your procedure. However, you should still follow specific instructions given to you by our office regarding your preparation for the procedure.  Your provider has requested that you go to the basement level for lab work before leaving today. Press "B" on the elevator. The lab is located at the first door on the left as you exit the elevator.   It was a pleasure to see you today!  Dr. Loletha Carrow

## 2018-03-02 NOTE — Progress Notes (Signed)
[name] [facility] Progress Note  Chief Complaint: Esophageal cancer  Subjective  History:  [name] was referred back to see me by oncology. He was seen in July of this year with reports of melena the setting of antiplatelet therapy for coronary disease and prior intervention.  He has multiple complex medical issues.  Upper endoscopy on August 6 found a large partially obstructing mid esophageal squamous cell carcinoma. He received radiation weekly Taxol/carboplatin from late August to early October. I reviewed his oncology follow-up note from 4 days ago, at which time they were hoping for a repeat upper endoscopy to consider restaging.  He also had persistent pancytopenia that they felt might be related to cirrhosis.  [name] has been doing fairly well since completing his therapy.  He had a difficult time with dysphagia and odynophagia during radiation, but says it has considerably improved and he is able to swallow without difficulty and has been gaining back some weight.  He has had episodes of a burning lower sternal discomfort that is not usually exertional.  He saw his cardiologist on December 6, they also felt this did not sound typical for angina, but a getting a nuclear stress test tomorrow to be certain.  ROS: Cardiovascular:  + chest pain - saw cardiology 12/6, and nuclear stress test scheduled Respiratory: no dyspnea Remainder of systems negative except as above  [name] Past Medical, Family and Social History were reviewed and are on file in the EMR.  Objective:  Med list reviewed  Current Outpatient Medications:  .  acetaminophen (TYLENOL) 325 MG tablet, Take 650 mg by mouth 2 (two) times daily., Disp: , Rfl:  .  atorvastatin (LIPITOR) 40 MG tablet, Take 1 tablet (40 mg total) by mouth daily at 6 PM., Disp: 30 tablet, Rfl: 0 .  clopidogrel (PLAVIX) 75 MG tablet, Take 1 tablet (75 mg total) by mouth daily., Disp: 30 tablet, Rfl: 0 .  HYDROcodone-acetaminophen (HYCET)  7.5-325 mg/15 ml solution, Take 5 mLs by mouth every 6 (six) hours as needed for moderate pain., Disp: 180 mL, Rfl: 0 .  losartan (COZAAR) 25 MG tablet, Take 1 tablet (25 mg total) by mouth daily., Disp: 90 tablet, Rfl: 3 .  metoprolol tartrate (LOPRESSOR) 25 MG tablet, TAKE 1 TABLET BY MOUTH TWICE DAILY, Disp: 180 tablet, Rfl: 3 .  nitroGLYCERIN (NITROSTAT) 0.4 MG SL tablet, Place 1 tablet (0.4 mg total) under the tongue every 5 (five) minutes as needed for chest pain., Disp: 25 tablet, Rfl: 2 .  Omega-3 Fatty Acids (FISH OIL) 1200 MG CAPS, Take 1,200 mg by mouth every evening., Disp: , Rfl:  .  pantoprazole (PROTONIX) 40 MG tablet, Take 1 tablet (40 mg total) by mouth daily., Disp: 90 tablet, Rfl: 3 .  vitamin C (ASCORBIC ACID) 500 MG tablet, Take 500 mg by mouth every evening., Disp: , Rfl:    Vital signs in last 24 hrs: Vitals:   03/02/18 1450  BP: 120/60  Pulse: 70    Physical Exam  He is well-appearing, alert, conversational good muscle mass  HEENT: sclera anicteric, oral mucosa moist without lesions  Neck: supple, no thyromegaly, JVD or lymphadenopathy  Cardiac: RRR without murmurs, S1S2 heard, no peripheral edema  Pulm: clear to auscultation bilaterally, normal RR and effort noted  Abdomen: soft, no tenderness, with active bowel sounds. No guarding or palpable hepatosplenomegaly.  Skin; warm and dry, no jaundice or rash.  No spider nevi  Neuro: No asterixis.  Steady gait, fluent speech  Recent Labs:  On July 30, WBC 4.4, hemoglobin 11.8, platelets 142  He was more significantly pancytopenic during his chemotherapy.  CBC Latest Ref Rng & Units 02/26/2018 01/27/2018 01/12/2018  WBC 4.0 - 10.5 K/uL 2.3(L) 1.7(L) 1.7(L)  Hemoglobin 13.0 - 17.0 g/dL 10.4(L) 9.4(L) 8.7(L)  Hematocrit 39.0 - 52.0 % 31.2(L) 29.4(L) 27.2(L)  Platelets 150 - 400 K/uL 53(L) 50(L) 74(L)   No INR checked since 2017  Radiologic studies:  Staging CT angiography 11-06-2017 suggested nodular  liver contour - might be consistent with cirrhosis.  No ascites Images personally reviewed  @ASSESSMENTPLANBEGIN @ Assessment: Encounter Diagnoses  Name Primary?  . Squamous cell esophageal cancer ([name]) Yes  . Antineoplastic chemotherapy induced pancytopenia ([name])   . Abnormal finding on GI tract imaging    A repeat upper endoscopy to reassess his tumor is warranted.  I will plan to do it at the hospital endoscopy lab due to his medical complexity.  It is not clear to me why his pancytopenia has not rebounded well after the chemotherapy, perhaps related to age, type of therapy use, perhaps underlying cirrhosis.  We really would have no way of knowing if he truly has cirrhosis less a liver biopsy was performed, but that is not necessary. If he does have cirrhosis, he has no complications of portal hypertension such as esophageal gastric varices, ascites or hepatic encephalopathy.  Thus, I do not think it really changes our approach to him.   Plan: Upper endoscopy at hospital endoscopy lab next month.  He was agreeable after discussion of procedure and risks.  The benefits and risks of the planned procedure were described in detail with the patient or (when appropriate) their health care proxy.  Risks were outlined as including, but not limited to, bleeding, infection, perforation, adverse medication reaction leading to cardiac or pulmonary decompensation, or pancreatitis (if ERCP).  The limitation of incomplete mucosal visualization was also discussed.  No guarantees or warranties were given.  Patient at increased risk for cardiopulmonary complications of procedure due to medical comorbidities.  I will message his cardiologist, Dr. Sallyanne Kuster, and request notification of his stress test results so we can make sure it is okay to proceed with EGD as scheduled.  I would also like [name] to be off his Plavix 5 days before the procedure, and would like to know that his cardiologist is okay with that  based on stress test results.  Draw INR today  Total time 40 minutes, over half spent face-to-face with patient in counseling and coordination of care.   [name]

## 2018-03-03 ENCOUNTER — Ambulatory Visit (HOSPITAL_COMMUNITY)
Admission: RE | Admit: 2018-03-03 | Discharge: 2018-03-03 | Disposition: A | Discharge status: Home / Self Care | Payer: Medicare Other | Source: Ambulatory Visit | Attending: Cardiovascular Disease | Admitting: Cardiovascular Disease

## 2018-03-03 DIAGNOSIS — R072 Precordial pain: Secondary | ICD-10-CM | POA: Diagnosis not present

## 2018-03-03 LAB — MYOCARDIAL PERFUSION IMAGING
CHL CUP RESTING HR STRESS: 76 {beats}/min
CSEPPHR: 86 {beats}/min
LV dias vol: 164 mL (ref 62–150)
LV sys vol: 90 mL
SDS: 6
SRS: 7
SSS: 13
TID: 1.4

## 2018-03-03 MED ORDER — TECHNETIUM TC 99M TETROFOSMIN IV KIT
27.0000 | PACK | Freq: Once | INTRAVENOUS | Status: AC | PRN
  Administered 2018-03-03: 27 via INTRAVENOUS
  Filled 2018-03-03: qty 27

## 2018-03-03 MED ORDER — TECHNETIUM TC 99M TETROFOSMIN IV KIT
8.2000 | PACK | Freq: Once | INTRAVENOUS | Status: AC | PRN
  Administered 2018-03-03: 8.2 via INTRAVENOUS
  Filled 2018-03-03: qty 9

## 2018-03-03 MED ORDER — REGADENOSON 0.4 MG/5ML IV SOLN
0.4000 mg | Freq: Once | INTRAVENOUS | Status: AC
  Administered 2018-03-03: 0.4 mg via INTRAVENOUS

## 2018-03-08 ENCOUNTER — Ambulatory Visit (INDEPENDENT_AMBULATORY_CARE_PROVIDER_SITE_OTHER): Payer: Medicare Other

## 2018-03-08 DIAGNOSIS — I495 Sick sinus syndrome: Secondary | ICD-10-CM | POA: Diagnosis not present

## 2018-03-08 NOTE — Progress Notes (Signed)
Remote pacemaker transmission.   

## 2018-03-10 LAB — CUP PACEART REMOTE DEVICE CHECK
Battery Remaining Longevity: 64 mo
Battery Remaining Percentage: 51 %
Battery Voltage: 2.87 V
Brady Statistic AP VP Percent: 1 %
Brady Statistic AP VS Percent: 10 %
Brady Statistic AS VP Percent: 1 %
Brady Statistic AS VS Percent: 87 %
Brady Statistic RA Percent Paced: 9.5 %
Date Time Interrogation Session: 20191223120425
Implantable Lead Implant Date: 19990914
Implantable Lead Implant Date: 19990914
Implantable Lead Location: 753859
Implantable Lead Location: 753860
Implantable Pulse Generator Implant Date: 20110601
Lead Channel Impedance Value: 310 Ohm
Lead Channel Impedance Value: 440 Ohm
Lead Channel Pacing Threshold Amplitude: 0.5 V
Lead Channel Pacing Threshold Amplitude: 1.75 V
Lead Channel Pacing Threshold Pulse Width: 0.7 ms
Lead Channel Sensing Intrinsic Amplitude: 0.5 mV
Lead Channel Sensing Intrinsic Amplitude: 10.5 mV
Lead Channel Setting Pacing Amplitude: 2 V
Lead Channel Setting Pacing Amplitude: 2 V
Lead Channel Setting Pacing Pulse Width: 0.7 ms
MDC IDC MSMT LEADCHNL RA PACING THRESHOLD PULSEWIDTH: 0.4 ms
MDC IDC PG SERIAL: 7130714
MDC IDC SET LEADCHNL RV SENSING SENSITIVITY: 2 mV
MDC IDC STAT BRADY RV PERCENT PACED: 1 %
Pulse Gen Model: 2210

## 2018-03-19 DIAGNOSIS — Z7901 Long term (current) use of anticoagulants: Secondary | ICD-10-CM | POA: Insufficient documentation

## 2018-03-19 DIAGNOSIS — R040 Epistaxis: Secondary | ICD-10-CM | POA: Insufficient documentation

## 2018-03-25 ENCOUNTER — Encounter (HOSPITAL_COMMUNITY): Payer: Self-pay | Admitting: *Deleted

## 2018-03-25 ENCOUNTER — Other Ambulatory Visit: Payer: Self-pay

## 2018-03-25 ENCOUNTER — Ambulatory Visit: Payer: Medicare Other | Admitting: Cardiology

## 2018-03-29 ENCOUNTER — Ambulatory Visit (HOSPITAL_COMMUNITY): Payer: Medicare Other | Admitting: Certified Registered Nurse Anesthetist

## 2018-03-29 ENCOUNTER — Encounter (HOSPITAL_COMMUNITY)
Admission: RE | Disposition: A | Discharge status: Home / Self Care | Payer: Self-pay | Source: Home / Self Care | Attending: Gastroenterology

## 2018-03-29 ENCOUNTER — Other Ambulatory Visit: Payer: Self-pay

## 2018-03-29 ENCOUNTER — Encounter (HOSPITAL_COMMUNITY): Payer: Self-pay | Admitting: *Deleted

## 2018-03-29 ENCOUNTER — Ambulatory Visit (HOSPITAL_COMMUNITY)
Admission: RE | Admit: 2018-03-29 | Discharge: 2018-03-29 | Disposition: A | Discharge status: Home / Self Care | Payer: Medicare Other | Attending: Gastroenterology | Admitting: Gastroenterology

## 2018-03-29 DIAGNOSIS — I739 Peripheral vascular disease, unspecified: Secondary | ICD-10-CM | POA: Diagnosis not present

## 2018-03-29 DIAGNOSIS — C159 Malignant neoplasm of esophagus, unspecified: Secondary | ICD-10-CM

## 2018-03-29 DIAGNOSIS — J449 Chronic obstructive pulmonary disease, unspecified: Secondary | ICD-10-CM | POA: Diagnosis not present

## 2018-03-29 DIAGNOSIS — Z952 Presence of prosthetic heart valve: Secondary | ICD-10-CM | POA: Diagnosis not present

## 2018-03-29 DIAGNOSIS — I495 Sick sinus syndrome: Secondary | ICD-10-CM | POA: Insufficient documentation

## 2018-03-29 DIAGNOSIS — I119 Hypertensive heart disease without heart failure: Secondary | ICD-10-CM | POA: Insufficient documentation

## 2018-03-29 DIAGNOSIS — I252 Old myocardial infarction: Secondary | ICD-10-CM | POA: Diagnosis not present

## 2018-03-29 DIAGNOSIS — Z7902 Long term (current) use of antithrombotics/antiplatelets: Secondary | ICD-10-CM | POA: Insufficient documentation

## 2018-03-29 DIAGNOSIS — I480 Paroxysmal atrial fibrillation: Secondary | ICD-10-CM | POA: Insufficient documentation

## 2018-03-29 DIAGNOSIS — I2510 Atherosclerotic heart disease of native coronary artery without angina pectoris: Secondary | ICD-10-CM | POA: Insufficient documentation

## 2018-03-29 DIAGNOSIS — D61810 Antineoplastic chemotherapy induced pancytopenia: Secondary | ICD-10-CM

## 2018-03-29 DIAGNOSIS — Z09 Encounter for follow-up examination after completed treatment for conditions other than malignant neoplasm: Secondary | ICD-10-CM | POA: Diagnosis present

## 2018-03-29 DIAGNOSIS — Z950 Presence of cardiac pacemaker: Secondary | ICD-10-CM | POA: Insufficient documentation

## 2018-03-29 DIAGNOSIS — I714 Abdominal aortic aneurysm, without rupture: Secondary | ICD-10-CM | POA: Diagnosis not present

## 2018-03-29 DIAGNOSIS — Z8501 Personal history of malignant neoplasm of esophagus: Secondary | ICD-10-CM | POA: Insufficient documentation

## 2018-03-29 DIAGNOSIS — G4733 Obstructive sleep apnea (adult) (pediatric): Secondary | ICD-10-CM | POA: Diagnosis not present

## 2018-03-29 DIAGNOSIS — Z87891 Personal history of nicotine dependence: Secondary | ICD-10-CM | POA: Insufficient documentation

## 2018-03-29 DIAGNOSIS — Z9861 Coronary angioplasty status: Secondary | ICD-10-CM | POA: Diagnosis not present

## 2018-03-29 DIAGNOSIS — R933 Abnormal findings on diagnostic imaging of other parts of digestive tract: Secondary | ICD-10-CM

## 2018-03-29 DIAGNOSIS — Z8546 Personal history of malignant neoplasm of prostate: Secondary | ICD-10-CM | POA: Insufficient documentation

## 2018-03-29 DIAGNOSIS — Z951 Presence of aortocoronary bypass graft: Secondary | ICD-10-CM | POA: Diagnosis not present

## 2018-03-29 DIAGNOSIS — T451X5A Adverse effect of antineoplastic and immunosuppressive drugs, initial encounter: Secondary | ICD-10-CM

## 2018-03-29 HISTORY — PX: ESOPHAGOGASTRODUODENOSCOPY (EGD) WITH PROPOFOL: SHX5813

## 2018-03-29 SURGERY — ESOPHAGOGASTRODUODENOSCOPY (EGD) WITH PROPOFOL
Anesthesia: Monitor Anesthesia Care

## 2018-03-29 MED ORDER — PROPOFOL 10 MG/ML IV BOLUS
INTRAVENOUS | Status: DC | PRN
  Administered 2018-03-29: 20 mg via INTRAVENOUS

## 2018-03-29 MED ORDER — PROPOFOL 10 MG/ML IV BOLUS
INTRAVENOUS | Status: AC
  Filled 2018-03-29: qty 60

## 2018-03-29 MED ORDER — PROPOFOL 500 MG/50ML IV EMUL
INTRAVENOUS | Status: DC | PRN
  Administered 2018-03-29: 125 ug/kg/min via INTRAVENOUS

## 2018-03-29 MED ORDER — LACTATED RINGERS IV SOLN
INTRAVENOUS | Status: DC
  Administered 2018-03-29: 10:00:00 via INTRAVENOUS

## 2018-03-29 MED ORDER — SODIUM CHLORIDE 0.9 % IV SOLN: INTRAVENOUS | Status: DC

## 2018-03-29 SURGICAL SUPPLY — 15 items

## 2018-03-29 NOTE — Transfer of Care (Signed)
Immediate Anesthesia Transfer of Care Note  Patient: [name].  Procedure(s) Performed: ESOPHAGOGASTRODUODENOSCOPY (EGD) WITH PROPOFOL (N/A )  Patient Location: Endoscopy Unit  Anesthesia Type:MAC  Level of Consciousness: drowsy  Airway & Oxygen Therapy: Patient Spontanous Breathing and Patient connected to nasal cannula oxygen  Post-op Assessment: Report given to RN and Post -op Vital signs reviewed and stable  Post vital signs: Reviewed and stable  Last Vitals:  Vitals Value Taken Time  BP    Temp    Pulse 72 03/29/2018 10:22 AM  Resp 18 03/29/2018 10:22 AM  SpO2 98 % 03/29/2018 10:22 AM  Vitals shown include unvalidated device data.  Last Pain:  Vitals:   03/29/18 0950  TempSrc: Oral  PainSc: 0-No pain         Complications: No apparent anesthesia complications

## 2018-03-29 NOTE — H&P (Signed)
History:  This patient presents for endoscopic testing for squamous cancer esophagus See 03/02/18 office note for details.  [employer]. Referring physician: [name], MD  Past Medical History: Past Medical History:  Diagnosis Date  . AAA (abdominal aortic aneurysm) ([place])    a. Last duplex - 3.8cm 01/2013 - due 01/2014.  [name] Acne rosacea   . Alcoholic hepatitis with ascites 08/28/2017  . Anginal pain ([place]) 1996  . Aortic stenosis, mild    Last echo 05/04/12 +LVH  . Arthritis    "shoulders" (09/01/2017)  . Back pain    hx epidural injections  . Baker's cyst    Lt.  [name] CAD (coronary artery disease)    a. s/p MI/PTCA - Cx 1994. b. s/p CABG x5 in 1997. c. Abnl nuc 2003- occ SVG-RCA, patent seq SVG-OM1-dCxOM, patentl LIMA-LAD, patent, SVG-diag. d. Normal nuc 09/2012.  . Carotid artery disease ([place])    a. Duplex 01/2013: mildly abnormal. Mild plaque without significant diameter reduction. Repeat recommended 11/15.  [name] COPD (chronic obstructive pulmonary disease) ([place])   . Dizziness and giddiness 08/04/2013  . GERD (gastroesophageal reflux disease)   . H/O: GI bleed    mild, neg. colonoscopy  . Heart murmur   . HOH (hard of hearing)   . HTN (hypertension)   . Hyperlipidemia   . Hypertensive heart disease   . Myocardial infarction ([place]) 1994  . OSA on CPAP   . OSA treated with BiPAP 11/20/2015   Severe with [facility] 38/hr  . PAF (paroxysmal atrial fibrillation) ([place])    a. Remote per records.   . Pneumonia    history  . Presence of permanent cardiac pacemaker    DOI 1999 with change out 2006; St Jude  . Prostate cancer ([place]) ~ 2010   a. s/p radical retropubic prostatectomy with bilateral pelvic lymph node dissection  . PVD (peripheral vascular disease) ([place])    a. Rt ext iliac stenosis, last PV cath 2003, moderate stenosis last Lower Ext Dopplers 12/16/11 - Rt. ABI 0.96  Lt ABI 1.0.  . Sick sinus syndrome ([place])    a. placed 1999, gen change 2011 - St. Jude.  . Sinus node  dysfunction ([place]) 01/20/2013  . Syncope 11/28/97  . Thrombocytopenia ([facility])    chronic, mild  . Valvular heart disease    a. Echo 04/2012: mild-mod AS, mild AI, mild MR.     Past Surgical History: Past Surgical History:  Procedure Laterality Date  . APPENDECTOMY    . BIOPSY  10/20/2017   Procedure: BIOPSY;  Surgeon: Doran Stabler, MD;  Location: [facility];  Service: Gastroenterology;;  . CARDIAC CATHETERIZATION  2003   VG to RCA occluded, other grafts patent  . CARDIAC CATHETERIZATION N/A 05/04/2015   Procedure: Right/Left Heart Cath and Coronary/Graft Angiography;  Surgeon: Sherren Mocha, MD;  Location: [place] [facility];  Service: Cardiovascular;  Laterality: N/A;  . CARDIAC CATHETERIZATION  09/01/2017  . CARDIAC VALVE REPLACEMENT    . CATARACT EXTRACTION W/ INTRAOCULAR LENS  IMPLANT, BILATERAL Bilateral   . COLONOSCOPY    . CORONARY ANGIOPLASTY  03/29/92   PTCA to LCX  . CORONARY ARTERY BYPASS GRAFT  1997   LIMA-LAD; VG-diag; seq VG-1st OM & distal LCX; VG-RCA  . CORONARY ATHERECTOMY N/A 09/01/2017   Procedure: CORONARY ATHERECTOMY;  Surgeon: Jettie Booze, MD;  Location: [place] [facility];  Service: Cardiovascular;  Laterality: N/A;  . CORONARY ATHERECTOMY  10/01/2017  . CORONARY ATHERECTOMY N/A 10/01/2017  Procedure: CORONARY ATHERECTOMY;  Surgeon: Leonie Man, MD;  Location: [place] [facility];  Service: Cardiovascular;  Laterality: N/A;  . ESOPHAGOGASTRODUODENOSCOPY (EGD) WITH PROPOFOL N/A 10/20/2017   Procedure: ESOPHAGOGASTRODUODENOSCOPY (EGD) WITH PROPOFOL;  Surgeon: Doran Stabler, MD;  Location: [facility];  Service: Gastroenterology;  Laterality: N/A;  . INSERT / REPLACE / REMOVE PACEMAKER  11/28/1997   pacesetter--ERI 2011  . LEFT HEART CATH N/A 09/01/2017   Procedure: Left Heart Cath;  Surgeon: Jettie Booze, MD;  Location: [place] [facility];  Service: Cardiovascular;  Laterality: N/A;  . LEFT HEART CATH AND CORS/GRAFTS ANGIOGRAPHY N/A  08/28/2017   Procedure: LEFT HEART CATH AND CORS/GRAFTS ANGIOGRAPHY;  Surgeon: Leonie Man, MD;  Location: [place] [facility];  Service: Cardiovascular;  Laterality: N/A;  . PACEMAKER GENERATOR CHANGE  08/15/2009   St. Jude accent  . SUPRAPUBIC PROSTATECTOMY    . TEE WITHOUT CARDIOVERSION N/A 06/12/2015   Procedure: TRANSESOPHAGEAL ECHOCARDIOGRAM (TEE);  Surgeon: Sherren Mocha, MD;  Location: [place];  Service: Open Heart Surgery;  Laterality: N/A;  . TONSILLECTOMY    . TRANSCATHETER AORTIC VALVE REPLACEMENT, TRANSFEMORAL N/A 06/12/2015   Procedure: TRANSCATHETER AORTIC VALVE REPLACEMENT, TRANSFEMORAL, possible transpical;  Surgeon: Sherren Mocha, MD;  Location: [place];  Service: Open Heart Surgery;  Laterality: N/A;    Allergies: No Known Allergies  Outpatient Meds: Current Facility-Administered Medications  Medication Dose Route Frequency Provider Last Rate Last Dose  . lactated ringers infusion   Intravenous Continuous [name], MD 20 mL/hr at 03/29/18 [phone]       Recent cardiac stress test without evidence of ischemia  Patient has been off plavix 5 days prior to EGD ___________________________________________________________________ Objective   Exam:  BP 139/79   Pulse 67   Temp 98 F (36.7 C) (Oral)   Resp (!) 22   Ht 5\' 7"  (1.702 m)   Wt 83.9 kg   SpO2 98%   BMI 28.98 kg/m    CV: RRR without murmur, S1/S2, no JVD, no peripheral edema  Resp: clear to auscultation bilaterally, normal RR and effort noted  GI: soft, no tenderness, with active bowel sounds. No guarding or palpable organomegaly noted.  Neuro: awake, alert and oriented x 3. Normal gross motor function and fluent speech   Assessment:  Esophageal cancer, s/p treatment  Plan:  EGD to reassess cancer   [name]

## 2018-03-29 NOTE — Op Note (Signed)
[facility] Patient Name: [name] Procedure Date: 03/29/2018 MRN: [id number] Attending MD: Estill Cotta. Loletha Carrow , MD Date of Birth: 1934/07/26 CSN: [id number] Age: 84 Admit Type: Outpatient Procedure:                Upper GI endoscopy Indications:              Personal history of malignant esophageal neoplasm                            (squamous cancer, s/p treatment) Providers:                Estill Cotta. Loletha Carrow, MD, Zenon Mayo, RN, Cherylynn Ridges, Technician, Dellie Catholic Referring MD:             Betsy Coder, MD Medicines:                Monitored Anesthesia Care Complications:            No immediate complications. Estimated Blood Loss:     Estimated blood loss: none. Procedure:                Pre-Anesthesia Assessment:                           - Prior to the procedure, a History and Physical                            was performed, and patient medications and                            allergies were reviewed. [name] tolerance of                            previous anesthesia was also reviewed. The risks                            and benefits of the procedure and the sedation                            options and risks were discussed with the patient.                            All questions were answered, and informed consent                            was obtained. Prior Anticoagulants: The patient has                            taken Plavix (clopidogrel), last dose was 5 days                            prior to procedure. ASA Grade Assessment: III - A  patient with severe systemic disease. After                            reviewing the risks and benefits, the patient was                            deemed in satisfactory condition to undergo the                            procedure.                           After obtaining informed consent, the endoscope was                            passed under  direct vision. Throughout the                            procedure, the patient's blood pressure, pulse, and                            oxygen saturations were monitored continuously. The                            GIF-H190 ([id number]) Olympus adult endoscope was                            introduced through the mouth, and advanced to the                            second part of duodenum. The upper GI endoscopy was                            accomplished without difficulty. The patient                            tolerated the procedure well. Scope In: Scope Out: Findings:      Mucosal variance characterized by faint scarring and smoothness was       found in the middle third of the esophagus. No mass was seen.      The stomach was normal.      The cardia and gastric fundus were normal on retroflexion.      The examined duodenum was normal. Impression:               - Esophageal mucosal variant.                           - Normal stomach.                           - Normal examined duodenum.                           - No specimens collected.  Excellent response to treatment. Moderate Sedation:      Not Applicable - Patient had care per Anesthesia. Recommendation:           - Patient has a contact number available for                            emergencies. The signs and symptoms of potential                            delayed complications were discussed with the                            patient. Return to normal activities tomorrow.                            Written discharge instructions were provided to the                            patient.                           - Resume previous diet.                           - Resume Plavix (clopidogrel) at prior dose today.                           - Return to referring physician. Procedure Code(s):        --- Professional ---                           [phone], Esophagogastroduodenoscopy, flexible,                             transoral; diagnostic, including collection of                            specimen(s) by brushing or washing, when performed                            (separate procedure) Diagnosis Code(s):        --- Professional ---                           K22.8, Other specified diseases of esophagus                           Z85.01, Personal history of malignant neoplasm of                            esophagus CPT copyright 2018 American Medical Association. All rights reserved. The codes documented in this report are preliminary and upon coder review may  be revised to meet current compliance requirements. Henry L. Loletha Carrow, MD 03/29/2018 10:21:07 AM This report has been signed electronically. Number of Addenda: 0

## 2018-03-29 NOTE — Anesthesia Preprocedure Evaluation (Signed)
Anesthesia Evaluation  Patient identified by MRN, date of birth, ID band Patient awake    Reviewed: Allergy & Precautions, NPO status , Patient's Chart, lab work & pertinent test results  Airway Mallampati: II  TM Distance: >3 FB Neck ROM: Full    Dental no notable dental hx.    Pulmonary sleep apnea , COPD, former smoker,    Pulmonary exam normal breath sounds clear to auscultation       Cardiovascular hypertension, + CAD and + Past MI  Normal cardiovascular exam+ pacemaker + Valvular Problems/Murmurs  Rhythm:Regular Rate:Normal     Neuro/Psych negative neurological ROS  negative psych ROS   GI/Hepatic negative GI ROS, Neg liver ROS,   Endo/Other  negative endocrine ROS  Renal/GU negative Renal ROS  negative genitourinary   Musculoskeletal negative musculoskeletal ROS (+)   Abdominal   Peds negative pediatric ROS (+)  Hematology negative hematology ROS (+)   Anesthesia Other Findings   Reproductive/Obstetrics negative OB ROS                             Anesthesia Physical Anesthesia Plan  ASA: III  Anesthesia Plan: MAC   Post-op Pain Management:    Induction:   PONV Risk Score and Plan: 1 and Treatment may vary due to age or medical condition  Airway Management Planned: Nasal Cannula  Additional Equipment:   Intra-op Plan:   Post-operative Plan:   Informed Consent: I have reviewed the patients History and Physical, chart, labs and discussed the procedure including the risks, benefits and alternatives for the proposed anesthesia with the patient or authorized representative who has indicated his/her understanding and acceptance.   Dental advisory given  Plan Discussed with: CRNA  Anesthesia Plan Comments:         Anesthesia Quick Evaluation

## 2018-03-29 NOTE — Discharge Instructions (Signed)
YOU HAD AN ENDOSCOPIC PROCEDURE TODAY: Refer to the procedure report and other information in the discharge instructions given to you for any specific questions about what was found during the examination. If this information does not answer your questions, please call [place] office at [phone] to clarify.  ° °YOU SHOULD EXPECT: Some feelings of bloating in the abdomen. Passage of more gas than usual. Walking can help get rid of the air that was put into your GI tract during the procedure and reduce the bloating. If you had a lower endoscopy (such as a colonoscopy or flexible sigmoidoscopy) you may notice spotting of blood in your stool or on the toilet paper. Some abdominal soreness may be present for a day or two, also. ° °DIET: Your first meal following the procedure should be a light meal and then it is ok to progress to your normal diet. A half-sandwich or bowl of soup is an example of a good first meal. Heavy or fried foods are harder to digest and may make you feel nauseous or bloated. Drink plenty of fluids but you should avoid alcoholic beverages for 24 hours. If you had a esophageal dilation, please see attached instructions for diet.   ° °ACTIVITY: Your care partner should take you home directly after the procedure. You should plan to take it easy, moving slowly for the rest of the day. You can resume normal activity the day after the procedure however YOU SHOULD NOT DRIVE, use power tools, machinery or perform tasks that involve climbing or major physical exertion for 24 hours (because of the sedation medicines used during the test).  ° °SYMPTOMS TO REPORT IMMEDIATELY: °A gastroenterologist can be reached at any hour. Please call [phone]  for any of the following symptoms:  °Following lower endoscopy (colonoscopy, flexible sigmoidoscopy) °Excessive amounts of blood in the stool  °Significant tenderness, worsening of abdominal pains  °Swelling of the abdomen that is new, acute  °Fever of 100° or  higher  °Following upper endoscopy (EGD, EUS, ERCP, esophageal dilation) °Vomiting of blood or coffee ground material  °New, significant abdominal pain  °New, significant chest pain or pain under the shoulder blades  °Painful or persistently difficult swallowing  °New shortness of breath  °Black, tarry-looking or red, bloody stools ° °FOLLOW UP:  °If any biopsies were taken you will be contacted by phone or by letter within the next 1-3 weeks. Call [phone]  if you have not heard about the biopsies in 3 weeks.  °Please also call with any specific questions about appointments or follow up tests. ° °

## 2018-03-29 NOTE — Interval H&P Note (Signed)
History and Physical Interval Note:  03/29/2018 10:04 AM  [name].  has presented today for surgery, with the diagnosis of Esophageal cancer  The various methods of treatment have been discussed with the patient and family. After consideration of risks, benefits and other options for treatment, the patient has consented to  Procedure(s): ESOPHAGOGASTRODUODENOSCOPY (EGD) WITH PROPOFOL (N/A) as a surgical intervention .  [name] history has been reviewed, patient examined, no change in status, stable for surgery.  I have reviewed the patient's chart and labs.  Questions were answered to the patient's satisfaction.     [name]

## 2018-03-29 NOTE — Anesthesia Postprocedure Evaluation (Signed)
Anesthesia Post Note  Patient: [name].  Procedure(s) Performed: ESOPHAGOGASTRODUODENOSCOPY (EGD) WITH PROPOFOL (N/A )     Patient location during evaluation: Endoscopy Anesthesia Type: MAC Level of consciousness: awake and alert Pain management: pain level controlled Vital Signs Assessment: post-procedure vital signs reviewed and stable Respiratory status: spontaneous breathing, nonlabored ventilation, respiratory function stable and patient connected to nasal cannula oxygen Cardiovascular status: stable and blood pressure returned to baseline Postop Assessment: no apparent nausea or vomiting Anesthetic complications: no    Last Vitals:  Vitals:   03/29/18 1035 03/29/18 1038  BP:  132/66  Pulse: 69 65  Resp: (!) 21 19  Temp:    SpO2: 100% 99%    Last Pain:  Vitals:   03/29/18 1038  TempSrc:   PainSc: 0-No pain                 Montez Hageman

## 2018-03-30 ENCOUNTER — Encounter (HOSPITAL_COMMUNITY): Payer: Self-pay | Admitting: Gastroenterology

## 2018-04-04 ENCOUNTER — Encounter: Payer: Self-pay | Admitting: Cardiology

## 2018-04-05 ENCOUNTER — Ambulatory Visit (INDEPENDENT_AMBULATORY_CARE_PROVIDER_SITE_OTHER): Payer: Medicare Other | Admitting: Cardiology

## 2018-04-05 ENCOUNTER — Encounter: Payer: Self-pay | Admitting: Cardiology

## 2018-04-05 VITALS — BP 152/70 | HR 68 | Ht 67.0 in | Wt 188.8 lb

## 2018-04-05 DIAGNOSIS — I10 Essential (primary) hypertension: Secondary | ICD-10-CM

## 2018-04-05 DIAGNOSIS — G4733 Obstructive sleep apnea (adult) (pediatric): Secondary | ICD-10-CM

## 2018-04-05 DIAGNOSIS — E669 Obesity, unspecified: Secondary | ICD-10-CM | POA: Diagnosis not present

## 2018-04-05 MED ORDER — PANTOPRAZOLE SODIUM 40 MG PO TBEC: 40.0000 mg | DELAYED_RELEASE_TABLET | Freq: Every day | ORAL | 3 refills | Status: DC

## 2018-04-05 NOTE — Patient Instructions (Signed)

## 2018-04-05 NOTE — Progress Notes (Signed)
Cardiology Office Note:    Date:  04/05/2018   ID:  Curtis Newcomer., DOB Jun 26, 1934, MRN [id number]  PCP:  [name], MD  Cardiologist:  [name], MD    Referring MD: [name], *   Chief Complaint  Patient presents with  . Sleep Apnea    History of Present Illness:    [name]. is a 84 y.o. male with a hx of OSA with an AHI of 38/hr andis onBiPAP at16/12cm H2O. when I last saw him he had stated that he would like to have a new unit because his was old.  He is now back for follow-up on his new device per insurance requirements to document compliance.He is doing well with his PAP device and thinks that he has gotten used to it.  He tolerates the mask and feels the pressure is adequate.  Since going on PAP he feels rested in the am and has no significant daytime sleepiness.  He denies any significant mouth or nasal dryness or nasal congestion.  He does not think that he snores.    Past Medical History:  Diagnosis Date  . AAA (abdominal aortic aneurysm) ([place])    a. Last duplex - 3.8cm 01/2013 - due 01/2014.  [name] Acne rosacea   . Alcoholic hepatitis with ascites 08/28/2017  . Anginal pain ([place]) 1996  . Aortic stenosis, mild    Last echo 05/04/12 +LVH  . Arthritis    "shoulders" (09/01/2017)  . Back pain    hx epidural injections  . Baker's cyst    Lt.  [name] CAD (coronary artery disease)    a. s/p MI/PTCA - Cx 1994. b. s/p CABG x5 in 1997. c. Abnl nuc 2003- occ SVG-RCA, patent seq SVG-OM1-dCxOM, patentl LIMA-LAD, patent, SVG-diag. d. Normal nuc 09/2012.  . Carotid artery disease ([place])    a. Duplex 01/2013: mildly abnormal. Mild plaque without significant diameter reduction. Repeat recommended 11/15.  [name] COPD (chronic obstructive pulmonary disease) ([place])   . Dizziness and giddiness 08/04/2013  . GERD (gastroesophageal reflux disease)   . H/O: GI bleed    mild, neg. colonoscopy  . Heart murmur   . HOH (hard of hearing)   . HTN (hypertension)   .  Hyperlipidemia   . Hypertensive heart disease   . Myocardial infarction ([place]) 1994  . OSA on CPAP   . OSA treated with BiPAP 11/20/2015   Severe with [facility] 38/hr  . PAF (paroxysmal atrial fibrillation) ([place])    a. Remote per records.   . Pneumonia    history  . Presence of permanent cardiac pacemaker    DOI 1999 with change out 2006; St Jude  . Prostate cancer ([place]) ~ 2010   a. s/p radical retropubic prostatectomy with bilateral pelvic lymph node dissection  . PVD (peripheral vascular disease) ([place])    a. Rt ext iliac stenosis, last PV cath 2003, moderate stenosis last Lower Ext Dopplers 12/16/11 - Rt. ABI 0.96  Lt ABI 1.0.  . Sick sinus syndrome ([place])    a. placed 1999, gen change 2011 - St. Jude.  . Sinus node dysfunction ([place]) 01/20/2013  . Syncope 11/28/97  . Thrombocytopenia ([facility])    chronic, mild  . Valvular heart disease    a. Echo 04/2012: mild-mod AS, mild AI, mild MR.    Past Surgical History:  Procedure Laterality Date  . APPENDECTOMY    . BIOPSY  10/20/2017   Procedure: BIOPSY;  Surgeon: Doran Stabler, MD;  [facility];  Service: Gastroenterology;;  . CARDIAC CATHETERIZATION  2003   VG to RCA occluded, other grafts patent  . CARDIAC CATHETERIZATION N/A 05/04/2015   Procedure: Right/Left Heart Cath and Coronary/Graft Angiography;  Surgeon: Sherren Mocha, MD;  Location: [place] [facility];  Service: Cardiovascular;  Laterality: N/A;  . CARDIAC CATHETERIZATION  09/01/2017  . CARDIAC VALVE REPLACEMENT    . CATARACT EXTRACTION W/ INTRAOCULAR LENS  IMPLANT, BILATERAL Bilateral   . COLONOSCOPY    . CORONARY ANGIOPLASTY  03/29/92   PTCA to LCX  . CORONARY ARTERY BYPASS GRAFT  1997   LIMA-LAD; VG-diag; seq VG-1st OM & distal LCX; VG-RCA  . CORONARY ATHERECTOMY N/A 09/01/2017   Procedure: CORONARY ATHERECTOMY;  Surgeon: Jettie Booze, MD;  Location: [place] [facility];  Service: Cardiovascular;  Laterality: N/A;  . CORONARY ATHERECTOMY  10/01/2017  .  CORONARY ATHERECTOMY N/A 10/01/2017   Procedure: CORONARY ATHERECTOMY;  Surgeon: Leonie Man, MD;  Location: [place] [facility];  Service: Cardiovascular;  Laterality: N/A;  . ESOPHAGOGASTRODUODENOSCOPY (EGD) WITH PROPOFOL N/A 10/20/2017   Procedure: ESOPHAGOGASTRODUODENOSCOPY (EGD) WITH PROPOFOL;  Surgeon: Doran Stabler, MD;  [facility];  Service: Gastroenterology;  Laterality: N/A;  . ESOPHAGOGASTRODUODENOSCOPY (EGD) WITH PROPOFOL N/A 03/29/2018   Procedure: ESOPHAGOGASTRODUODENOSCOPY (EGD) WITH PROPOFOL;  Surgeon: Doran Stabler, MD;  [facility];  Service: Gastroenterology;  Laterality: N/A;  . INSERT / REPLACE / REMOVE PACEMAKER  11/28/1997   pacesetter--ERI 2011  . LEFT HEART CATH N/A 09/01/2017   Procedure: Left Heart Cath;  Surgeon: Jettie Booze, MD;  Location: [place] [facility];  Service: Cardiovascular;  Laterality: N/A;  . LEFT HEART CATH AND CORS/GRAFTS ANGIOGRAPHY N/A 08/28/2017   Procedure: LEFT HEART CATH AND CORS/GRAFTS ANGIOGRAPHY;  Surgeon: Leonie Man, MD;  Location: [place] [facility];  Service: Cardiovascular;  Laterality: N/A;  . PACEMAKER GENERATOR CHANGE  08/15/2009   St. Jude accent  . SUPRAPUBIC PROSTATECTOMY    . TEE WITHOUT CARDIOVERSION N/A 06/12/2015   Procedure: TRANSESOPHAGEAL ECHOCARDIOGRAM (TEE);  Surgeon: Sherren Mocha, MD;  Location: [place];  Service: Open Heart Surgery;  Laterality: N/A;  . TONSILLECTOMY    . TRANSCATHETER AORTIC VALVE REPLACEMENT, TRANSFEMORAL N/A 06/12/2015   Procedure: TRANSCATHETER AORTIC VALVE REPLACEMENT, TRANSFEMORAL, possible transpical;  Surgeon: Sherren Mocha, MD;  Location: [place];  Service: Open Heart Surgery;  Laterality: N/A;    Current Medications: Current Meds  Medication Sig  . acetaminophen (TYLENOL) 325 MG tablet Take 650 mg by mouth 2 (two) times daily.  [name] atorvastatin (LIPITOR) 40 MG tablet Take 1 tablet (40 mg total) by mouth daily at 6 PM.  . clopidogrel (PLAVIX) 75 MG  tablet Take 1 tablet (75 mg total) by mouth daily.  [name] losartan (COZAAR) 25 MG tablet Take 1 tablet (25 mg total) by mouth daily.  . metoprolol tartrate (LOPRESSOR) 25 MG tablet TAKE 1 TABLET BY MOUTH TWICE DAILY  . nitroGLYCERIN (NITROSTAT) 0.4 MG SL tablet Place 1 tablet (0.4 mg total) under the tongue every 5 (five) minutes as needed for chest pain.  . Omega-3 Fatty Acids (FISH OIL) 1200 MG CAPS Take 1,200 mg by mouth every evening.  . pantoprazole (PROTONIX) 40 MG tablet Take 1 tablet (40 mg total) by mouth daily.  . vitamin C (ASCORBIC ACID) 500 MG tablet Take 500 mg by mouth every evening.     Allergies:   Patient has no known allergies.   Social History   Socioeconomic  History  . Marital status: Widowed    Spouse name: Not on file  . Number of children: 5  . Years of education: 7th  . Highest education level: Not on file  Occupational History  . Occupation: Retired  Scientific laboratory technician  . Financial resource strain: Not on file  . Food insecurity:    Worry: Not on file    Inability: Not on file  . Transportation needs:    Medical: Not on file    Non-medical: Not on file  Tobacco Use  . Smoking status: Former Smoker    Packs/day: 2.00    Years: 48.00    Pack years: 96.00    Types: Cigarettes    Last attempt to quit: 03/18/1995    Years since quitting: 23.0  . Smokeless tobacco: Never Used  Substance and Sexual Activity  . Alcohol use: No    Comment: 09/01/2017 "nothing in the last couple years"  . Drug use: Never  . Sexual activity: Not Currently  Lifestyle  . Physical activity:    Days per week: Not on file    Minutes per session: Not on file  . Stress: Not on file  Relationships  . Social connections:    Talks on phone: Not on file    Gets together: Not on file    Attends religious service: Not on file    Active member of club or organization: Not on file    Attends meetings of clubs or organizations: Not on file    Relationship status: Not on file  Other Topics  Concern  . Not on file  Social History Narrative  . Not on file     Family History: [name] family history includes CAD in an other family member; Cancer in his brother; Colon cancer in his father; Heart Problems in his brother and sister.  ROS:   Please see the history of present illness.    ROS  All other systems reviewed and negative.   EKGs/Labs/Other Studies Reviewed:    The following studies were reviewed today: PAP dowboad  EKG:  EKG is not ordered today.   Recent Labs: 08/25/2017: B Natriuretic Peptide 121.7 12/15/2017: ALT 32 12/28/2017: BUN 21; Creatinine, Ser 0.94; Potassium 4.0; Sodium 140; TSH 1.970 02/26/2018: Hemoglobin 10.4; Platelet Count 53   Recent Lipid Panel    Component Value Date/Time   CHOL 120 10/22/2017 1154   TRIG 144 10/22/2017 1154   HDL 43 10/22/2017 1154   CHOLHDL 2.8 10/22/2017 1154   CHOLHDL 3.3 04/26/2015 1620   VLDL 41 (H) 04/26/2015 1620   LDLCALC 48 10/22/2017 1154    Physical Exam:    VS:  BP (!) 152/70   Pulse 68   Ht 5\' 7"  (1.702 m)   Wt 188 lb 12.8 oz (85.6 kg)   SpO2 95%   BMI 29.57 kg/m     Wt Readings from Last 3 Encounters:  04/05/18 188 lb 12.8 oz (85.6 kg)  03/29/18 185 lb (83.9 kg)  03/03/18 184 lb (83.5 kg)     GEN:  Well nourished, well developed in no acute distress HEENT: Normal NECK: No JVD; No carotid bruits LYMPHATICS: No lymphadenopathy CARDIAC: RRR, no murmurs, rubs, gallops RESPIRATORY:  Clear to auscultation without rales, wheezing or rhonchi  ABDOMEN: Soft, non-tender, non-distended MUSCULOSKELETAL:  No edema; No deformity  SKIN: Warm and dry NEUROLOGIC:  Alert and oriented x 3 PSYCHIATRIC:  Normal affect   ASSESSMENT:    1. OSA (obstructive sleep apnea)  2. Essential hypertension   3. Obesity (BMI 30-39.9)    PLAN:    In order of problems listed above:  1.  OSA - the patient is tolerating PAP therapy well without any problems. The PAP download was reviewed today and showed an  AHI of 1.1/hr on auto BIPAP with 81% compliance in using more than 4 hours nightly.  The patient has been using and benefiting from PAP use and will continue to benefit from therapy.   2. HTN -BP is borderline  controlled on exam today.  He will continue on Lopressor 25 mg twice daily, losartan 25 mg daily.  3.  Obesity - I have encouraged him to get into a routine exercise program and cut back on carbs and portions.    Medication Adjustments/Labs and Tests Ordered: Current medicines are reviewed at length with the patient today.  Concerns regarding medicines are outlined above.  No orders of the defined types were placed in this encounter.  No orders of the defined types were placed in this encounter.   Signed, Fransico Him, MD  04/05/2018 11:22 AM    [employer]

## 2018-04-06 ENCOUNTER — Telehealth: Payer: Self-pay

## 2018-04-06 NOTE — Telephone Encounter (Signed)
Called to arrange 1 year echo and OV with Dr. Burt Sims due May 2020. He had an echo 08/26/2017. Offered to schedule echo and office visit after 08/27/2018, but [name] refused echo because he has had too much testing done recently. Scheduled him for 1 year visit with Dr. Burt Sims 08/13/18. He understands an echo will be scheduled at that time if Dr. Burt Sims thinks necessary.  He was grateful for assistance.

## 2018-04-08 DIAGNOSIS — H9113 Presbycusis, bilateral: Secondary | ICD-10-CM | POA: Insufficient documentation

## 2018-04-22 ENCOUNTER — Encounter: Payer: Self-pay | Admitting: Internal Medicine

## 2018-04-22 ENCOUNTER — Ambulatory Visit (INDEPENDENT_AMBULATORY_CARE_PROVIDER_SITE_OTHER): Payer: Medicare Other | Admitting: Internal Medicine

## 2018-04-22 VITALS — BP 140/78 | HR 72 | Ht 67.0 in | Wt 189.6 lb

## 2018-04-22 DIAGNOSIS — Z952 Presence of prosthetic heart valve: Secondary | ICD-10-CM | POA: Diagnosis not present

## 2018-04-22 DIAGNOSIS — I495 Sick sinus syndrome: Secondary | ICD-10-CM

## 2018-04-22 DIAGNOSIS — Z950 Presence of cardiac pacemaker: Secondary | ICD-10-CM

## 2018-04-22 DIAGNOSIS — I480 Paroxysmal atrial fibrillation: Secondary | ICD-10-CM

## 2018-04-22 DIAGNOSIS — I10 Essential (primary) hypertension: Secondary | ICD-10-CM

## 2018-04-22 NOTE — Patient Instructions (Signed)

## 2018-04-22 NOTE — Progress Notes (Signed)
Patient Care Team: [name], MD as PCP - General (Family Medicine) Deboraha Sprang, MD as PCP - Cardiology (Cardiology) Deboraha Sprang, MD as Consulting Physician (Cardiology)   HPI  [name] Brooke Bonito. is a 84 y.o. male Seen in follow-up for pacemaker implanted remotely with generator replacement 2011. 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  He has a history of coronary artery disease with prior bypass grafting   Severe AS >>> TAVR with Edwards-Sapien 2/17    DATE TEST EF   7/14 Myoview  56 % No ischemia  6/19 Echo   45 %   6/19 LHC  LIMA-LAD, SVG-D1, SVG-R1-OM3 patent  SVG-RCA T RCA 95>>0 stent      Date Cr K Hgb  4/15 0.93 4.8   3/17 0.86 4.6   12/19 0.94 4.0 10.4   6/19 Interval MI necessitating stenting of the RCA As above   This fall also diagnosed with esophageal cancer Past Medical History:  Diagnosis Date  . AAA (abdominal aortic aneurysm) ([place])    a. Last duplex - 3.8cm 01/2013 - due 01/2014.  [name] Acne rosacea   . Alcoholic hepatitis with ascites 08/28/2017  . Anginal pain ([place]) 1996  . Aortic stenosis, mild    Last echo 05/04/12 +LVH  . Arthritis    "shoulders" (09/01/2017)  . Back pain    hx epidural injections  . Baker's cyst    Lt.  [name] CAD (coronary artery disease)    a. s/p MI/PTCA - Cx 1994. b. s/p CABG x5 in 1997. c. Abnl nuc 2003- occ SVG-RCA, patent seq SVG-OM1-dCxOM, patentl LIMA-LAD, patent, SVG-diag. d. Normal nuc 09/2012.  . Carotid artery disease ([place])    a. Duplex 01/2013: mildly abnormal. Mild plaque without significant diameter reduction. Repeat recommended 11/15.  [name] COPD (chronic obstructive pulmonary disease) ([place])   . Dizziness and giddiness 08/04/2013  . GERD (gastroesophageal reflux disease)   . H/O: GI bleed    mild, neg. colonoscopy  . Heart murmur   . HOH (hard of hearing)   . HTN (hypertension)   . Hyperlipidemia   . Hypertensive heart disease   . Myocardial  infarction ([place]) 1994  . OSA on CPAP   . OSA treated with BiPAP 11/20/2015   Severe with [facility] 38/hr  . PAF (paroxysmal atrial fibrillation) ([place])    a. Remote per records.   . Pneumonia    history  . Presence of permanent cardiac pacemaker    DOI 1999 with change out 2006; St Jude  . Prostate cancer ([place]) ~ 2010   a. s/p radical retropubic prostatectomy with bilateral pelvic lymph node dissection  . PVD (peripheral vascular disease) ([place])    a. Rt ext iliac stenosis, last PV cath 2003, moderate stenosis last Lower Ext Dopplers 12/16/11 - Rt. ABI 0.96  Lt ABI 1.0.  . Sick sinus syndrome ([place])    a. placed 1999, gen change 2011 - St. Jude.  . Sinus node dysfunction ([place]) 01/20/2013  . Syncope 11/28/97  . Thrombocytopenia ([facility])    chronic, mild  . Valvular heart disease    a. Echo 04/2012: mild-mod AS, mild AI, mild MR.    Past Surgical History:  Procedure Laterality Date  . APPENDECTOMY    . BIOPSY  10/20/2017   Procedure: BIOPSY;  Surgeon: Doran Stabler, MD;  [facility];  Service: Gastroenterology;;  . CARDIAC CATHETERIZATION  2003   VG to RCA occluded, other grafts patent  .  CARDIAC CATHETERIZATION N/A 05/04/2015   Procedure: Right/Left Heart Cath and Coronary/Graft Angiography;  Surgeon: Sherren Mocha, MD;  Location: [place] [facility];  Service: Cardiovascular;  Laterality: N/A;  . CARDIAC CATHETERIZATION  09/01/2017  . CARDIAC VALVE REPLACEMENT    . CATARACT EXTRACTION W/ INTRAOCULAR LENS  IMPLANT, BILATERAL Bilateral   . COLONOSCOPY    . CORONARY ANGIOPLASTY  03/29/92   PTCA to LCX  . CORONARY ARTERY BYPASS GRAFT  1997   LIMA-LAD; VG-diag; seq VG-1st OM & distal LCX; VG-RCA  . CORONARY ATHERECTOMY N/A 09/01/2017   Procedure: CORONARY ATHERECTOMY;  Surgeon: Jettie Booze, MD;  Location: [place] [facility];  Service: Cardiovascular;  Laterality: N/A;  . CORONARY ATHERECTOMY  10/01/2017  . CORONARY ATHERECTOMY N/A 10/01/2017   Procedure: CORONARY ATHERECTOMY;   Surgeon: Leonie Man, MD;  Location: [place] [facility];  Service: Cardiovascular;  Laterality: N/A;  . ESOPHAGOGASTRODUODENOSCOPY (EGD) WITH PROPOFOL N/A 10/20/2017   Procedure: ESOPHAGOGASTRODUODENOSCOPY (EGD) WITH PROPOFOL;  Surgeon: Doran Stabler, MD;  [facility];  Service: Gastroenterology;  Laterality: N/A;  . ESOPHAGOGASTRODUODENOSCOPY (EGD) WITH PROPOFOL N/A 03/29/2018   Procedure: ESOPHAGOGASTRODUODENOSCOPY (EGD) WITH PROPOFOL;  Surgeon: Doran Stabler, MD;  [facility];  Service: Gastroenterology;  Laterality: N/A;  . INSERT / REPLACE / REMOVE PACEMAKER  11/28/1997   pacesetter--ERI 2011  . LEFT HEART CATH N/A 09/01/2017   Procedure: Left Heart Cath;  Surgeon: Jettie Booze, MD;  Location: [place] [facility];  Service: Cardiovascular;  Laterality: N/A;  . LEFT HEART CATH AND CORS/GRAFTS ANGIOGRAPHY N/A 08/28/2017   Procedure: LEFT HEART CATH AND CORS/GRAFTS ANGIOGRAPHY;  Surgeon: Leonie Man, MD;  Location: [place] [facility];  Service: Cardiovascular;  Laterality: N/A;  . PACEMAKER GENERATOR CHANGE  08/15/2009   St. Jude accent  . SUPRAPUBIC PROSTATECTOMY    . TEE WITHOUT CARDIOVERSION N/A 06/12/2015   Procedure: TRANSESOPHAGEAL ECHOCARDIOGRAM (TEE);  Surgeon: Sherren Mocha, MD;  Location: [facility];  Service: Open Heart Surgery;  Laterality: N/A;  . TONSILLECTOMY    . TRANSCATHETER AORTIC VALVE REPLACEMENT, TRANSFEMORAL N/A 06/12/2015   Procedure: TRANSCATHETER AORTIC VALVE REPLACEMENT, TRANSFEMORAL, possible transpical;  Surgeon: Sherren Mocha, MD;  Location: [place];  Service: Open Heart Surgery;  Laterality: N/A;    Current Outpatient Medications  Medication Sig Dispense Refill  . acetaminophen (TYLENOL) 325 MG tablet Take 650 mg by mouth 2 (two) times daily.    [name] atorvastatin (LIPITOR) 40 MG tablet Take 1 tablet (40 mg total) by mouth daily at 6 PM. 30 tablet 0  . clopidogrel (PLAVIX) 75 MG tablet Take 1 tablet (75 mg total) by mouth  daily. 30 tablet 0  . losartan (COZAAR) 25 MG tablet Take 1 tablet (25 mg total) by mouth daily. 90 tablet 3  . metoprolol tartrate (LOPRESSOR) 25 MG tablet TAKE 1 TABLET BY MOUTH TWICE DAILY 180 tablet 3  . nitroGLYCERIN (NITROSTAT) 0.4 MG SL tablet Place 1 tablet (0.4 mg total) under the tongue every 5 (five) minutes as needed for chest pain. 25 tablet 2  . Omega-3 Fatty Acids (FISH OIL) 1200 MG CAPS Take 1,200 mg by mouth every evening.    . pantoprazole (PROTONIX) 40 MG tablet Take 1 tablet (40 mg total) by mouth daily. 90 tablet 3  . vitamin C (ASCORBIC ACID) 500 MG tablet Take 500 mg by mouth every evening.     No current facility-administered medications for this visit.     No Known Allergies  Review of Systems negative except from HPI and PMH  Physical Exam BP 140/78   Pulse 72   Ht 5\' 7"  (1.702 m)   Wt 189 lb 9.6 oz (86 kg)   SpO2 91%   BMI 29.70 kg/m  Well developed and nourished in no acute distress HENT normal Neck supple with JVP-flat Clear Regular rate and rhythm, no murmurs or gallops Abd-soft with active BS No Clubbing cyanosis edema Skin-warm and dry A & Oriented  Grossly normal sensory and motor function   ECG sinus w LBBB     Assessment and  Plan  Aortic stenosis-severe s/p TAVI  Coronary artery disease with prior bypass grafting  Pacemaker-St. Jude [name] device was interrogated.  The information was reviewed. No changes were made in the programming.   .     Sinus node dysfunction  LBBB p TAVR   Hypertension    Ventricular tachycardia   Subclinical Atrial fibrillation present     No significant atrial fibrillation or flutter-- longest duration as per device < 8 hrs   No intercurrent Ventricular tachycardia  BP well controlled    Without symptoms of ischemia

## 2018-04-23 ENCOUNTER — Inpatient Hospital Stay: Payer: Medicare Other | Attending: Oncology

## 2018-04-23 ENCOUNTER — Telehealth: Payer: Self-pay | Admitting: Oncology

## 2018-04-23 ENCOUNTER — Inpatient Hospital Stay (HOSPITAL_BASED_OUTPATIENT_CLINIC_OR_DEPARTMENT_OTHER): Payer: Medicare Other | Admitting: Oncology

## 2018-04-23 VITALS — BP 150/78 | HR 71 | Temp 98.1°F | Resp 19 | Ht 67.0 in | Wt 188.6 lb

## 2018-04-23 DIAGNOSIS — D61818 Other pancytopenia: Secondary | ICD-10-CM | POA: Diagnosis not present

## 2018-04-23 DIAGNOSIS — Z8546 Personal history of malignant neoplasm of prostate: Secondary | ICD-10-CM

## 2018-04-23 DIAGNOSIS — J449 Chronic obstructive pulmonary disease, unspecified: Secondary | ICD-10-CM | POA: Diagnosis not present

## 2018-04-23 DIAGNOSIS — C154 Malignant neoplasm of middle third of esophagus: Secondary | ICD-10-CM

## 2018-04-23 DIAGNOSIS — I739 Peripheral vascular disease, unspecified: Secondary | ICD-10-CM

## 2018-04-23 DIAGNOSIS — Z950 Presence of cardiac pacemaker: Secondary | ICD-10-CM | POA: Insufficient documentation

## 2018-04-23 DIAGNOSIS — I2510 Atherosclerotic heart disease of native coronary artery without angina pectoris: Secondary | ICD-10-CM | POA: Insufficient documentation

## 2018-04-23 LAB — CUP PACEART INCLINIC DEVICE CHECK
Date Time Interrogation Session: 20200207093322
Implantable Lead Implant Date: 19990914
Implantable Lead Location: 753859
Implantable Lead Location: 753860
MDC IDC LEAD IMPLANT DT: 19990914
MDC IDC PG IMPLANT DT: 20110601
Pulse Gen Model: 2210
Pulse Gen Serial Number: 7130714

## 2018-04-23 LAB — CBC WITH DIFFERENTIAL (CANCER CENTER ONLY)
Abs Immature Granulocytes: 0 10*3/uL (ref 0.00–0.07)
Basophils Absolute: 0 10*3/uL (ref 0.0–0.1)
Basophils Relative: 1 %
Eosinophils Absolute: 0.1 10*3/uL (ref 0.0–0.5)
Eosinophils Relative: 3 %
HCT: 30.8 % — ABNORMAL LOW (ref 39.0–52.0)
Hemoglobin: 10.4 g/dL — ABNORMAL LOW (ref 13.0–17.0)
Immature Granulocytes: 0 %
Lymphocytes Relative: 22 %
Lymphs Abs: 0.4 10*3/uL — ABNORMAL LOW (ref 0.7–4.0)
MCH: 32.3 pg (ref 26.0–34.0)
MCHC: 33.8 g/dL (ref 30.0–36.0)
MCV: 95.7 fL (ref 80.0–100.0)
Monocytes Absolute: 0.3 10*3/uL (ref 0.1–1.0)
Monocytes Relative: 16 %
Neutro Abs: 1 10*3/uL — ABNORMAL LOW (ref 1.7–7.7)
Neutrophils Relative %: 58 %
Platelet Count: 56 10*3/uL — ABNORMAL LOW (ref 150–400)
RBC: 3.22 MIL/uL — AB (ref 4.22–5.81)
RDW: 13.2 % (ref 11.5–15.5)
WBC Count: 1.7 10*3/uL — ABNORMAL LOW (ref 4.0–10.5)
nRBC: 0 % (ref 0.0–0.2)

## 2018-04-23 NOTE — Progress Notes (Signed)
  [facility] OFFICE PROGRESS NOTE   Diagnosis: Esophagus cancer, pancytopenia  INTERVAL HISTORY:   [name] returns as scheduled.  No dysphasia.  He continues to have reflux symptoms.  He saw Dr. Loletha Carrow and underwent an upper endoscopy 03/29/2018.  No esophageal mass.  Faint scarring was noted in the middle third of the esophagus.  Objective:  Vital signs in last 24 hours:  Blood pressure (!) 150/78, pulse 71, temperature 98.1 F (36.7 C), temperature source Oral, resp. rate 19, height 5\' 7"  (1.702 m), weight 188 lb 9.6 oz (85.5 kg), SpO2 97 %.    HEENT: Neck without mass Lymphatics: No cervical, supraclavicular, or axillary nodes Resp: Lungs clear bilaterally Cardio: Regular rate and rhythm GI: No hepatosplenomegaly, no mass Vascular: No leg edema   Lab Results:  Lab Results  Component Value Date   WBC 1.7 (L) 04/23/2018   HGB 10.4 (L) 04/23/2018   HCT 30.8 (L) 04/23/2018   MCV 95.7 04/23/2018   PLT 56 (L) 04/23/2018   NEUTROABS 1.0 (L) 04/23/2018    CMP  Lab Results  Component Value Date   NA 140 12/28/2017   K 4.0 12/28/2017   CL 101 12/28/2017   CO2 22 12/28/2017   GLUCOSE 103 (H) 12/28/2017   BUN 21 12/28/2017   CREATININE 0.94 12/28/2017   CALCIUM 9.1 12/28/2017   PROT 6.6 12/15/2017   ALBUMIN 3.1 (L) 12/15/2017   AST 40 12/15/2017   ALT 32 12/15/2017   ALKPHOS 120 12/15/2017   BILITOT 1.1 12/15/2017   GFRNONAA 76 12/28/2017   GFRAA 88 12/28/2017     Medications: I have reviewed the patient's current medications.   Assessment/Plan:  1. 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 Staging CTs 10/26/2017-asymmetric esophageal thickening, no evidence of metastatic disease ? Endoscopic biopsy 10/20/2017-mid esophagus partially obstructing mass, biopsy confirmed squamous cell carcinoma ? PET scan 11/02/2017-wall thickening in the mid esophagus SUV 27 MA: No findings specific for metastatic disease ? Radiation  11/09/2017-12/17/2017 ? Cycle 1 weekly Taxol/carboplatin 11/10/2017 ? Cycle 2 weekly Taxol/carboplatin 11/17/2017 (carboplatin held due to thrombocytopenia) ? Cycle 3 weekly Taxol/carboplatin 12/08/2017 (dosesreduced due to neutropenia, thrombocytopenia) ? Cycle 4 weekly Taxol/carboplatin 12/15/2017 ? Endoscopy 03/29/2018- no mass seen 2. Solid dysphagia and odynophagia secondary to #1-improved 3. Coronary artery disease 4. History of prostate cancer 5. COPD 6. Permanent cardiac pacemaker 7. Peripheral vascular disease 8. Chronic thrombocytopenia 9. 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 CT chest 12/28/2017- no evidence of pulmonary emboli. Esophageal thickening consistent with patient's history of esophageal carcinoma. 11. Probable cirrhosis, referred to GI   Disposition: [name] appears well.  The upper endoscopy 03/29/2018 revealed no evidence of remaining esophagus cancer.  He is in clinical remission.  He has persistent pancytopenia, likely secondary to cirrhosis.  He will seek medical attention for a fever or bleeding.  He will return for an office visit and CBC in 3 months.  Betsy Coder, MD  04/23/2018  10:45 AM

## 2018-04-23 NOTE — Telephone Encounter (Signed)
Scheduled appt per 02/07 los. ° °Printed calendar and avs. °

## 2018-05-05 ENCOUNTER — Other Ambulatory Visit: Payer: Self-pay | Admitting: Physician Assistant

## 2018-05-27 ENCOUNTER — Telehealth: Payer: Self-pay | Admitting: Cardiology

## 2018-05-27 NOTE — Telephone Encounter (Signed)
Tiffany from [facility] was calling. She needs compliance chart notes for the pt because the pt needs a cpap machine. Please call her at [phone]

## 2018-05-27 NOTE — Telephone Encounter (Signed)
Chart notes from office visit 04/05/18 was faxed over to [facility] for patient to attention Tiffany.

## 2018-06-07 ENCOUNTER — Ambulatory Visit (INDEPENDENT_AMBULATORY_CARE_PROVIDER_SITE_OTHER): Payer: Medicare Other | Admitting: *Deleted

## 2018-06-07 ENCOUNTER — Other Ambulatory Visit: Payer: Self-pay

## 2018-06-07 DIAGNOSIS — I495 Sick sinus syndrome: Secondary | ICD-10-CM

## 2018-06-08 LAB — CUP PACEART REMOTE DEVICE CHECK
Battery Remaining Longevity: 58 mo
Battery Remaining Percentage: 46 %
Battery Voltage: 2.86 V
Brady Statistic AP VP Percent: 1 %
Brady Statistic AP VS Percent: 1 %
Brady Statistic AS VP Percent: 1 %
Brady Statistic AS VS Percent: 98 %
Brady Statistic RA Percent Paced: 1 %
Brady Statistic RV Percent Paced: 1 %
Date Time Interrogation Session: 20200323123512
Implantable Lead Implant Date: 19990914
Implantable Lead Implant Date: 19990914
Implantable Lead Location: 753859
Implantable Lead Location: 753860
Implantable Pulse Generator Implant Date: 20110601
Lead Channel Impedance Value: 340 Ohm
Lead Channel Impedance Value: 450 Ohm
Lead Channel Pacing Threshold Amplitude: 0.5 V
Lead Channel Pacing Threshold Amplitude: 1.75 V
Lead Channel Pacing Threshold Pulse Width: 0.4 ms
Lead Channel Pacing Threshold Pulse Width: 0.7 ms
Lead Channel Sensing Intrinsic Amplitude: 0.6 mV
Lead Channel Sensing Intrinsic Amplitude: 11.5 mV
Lead Channel Setting Pacing Amplitude: 2 V
Lead Channel Setting Pacing Amplitude: 2 V
Lead Channel Setting Pacing Pulse Width: 0.7 ms
Lead Channel Setting Sensing Sensitivity: 2 mV
Pulse Gen Model: 2210
Pulse Gen Serial Number: 7130714

## 2018-06-16 NOTE — Progress Notes (Signed)
Remote pacemaker transmission.   

## 2018-07-21 ENCOUNTER — Telehealth: Payer: Self-pay | Admitting: Nurse Practitioner

## 2018-07-21 ENCOUNTER — Telehealth: Payer: Self-pay

## 2018-07-21 NOTE — Telephone Encounter (Signed)
R/s appt per 5/06 sch message - unable tp reach patient. Left message with appt date and time

## 2018-07-21 NOTE — Telephone Encounter (Signed)
Per Elby Showers. Reschedule pt lab and F/U appointment 4 weeks out from 07/22/18. Pt ok and aware of change. Pt verbalized understanding.

## 2018-07-21 NOTE — Telephone Encounter (Signed)
-----   Message from Owens Shark, NP sent at 07/21/2018  3:01 PM EDT ----- Please contact pt and move lab/ov scheduled tomorrow out 4 weeks as long as he is doing ok

## 2018-07-22 ENCOUNTER — Inpatient Hospital Stay: Payer: Medicare Other | Admitting: Nurse Practitioner

## 2018-07-22 ENCOUNTER — Inpatient Hospital Stay: Payer: Medicare Other

## 2018-07-26 ENCOUNTER — Telehealth: Payer: Self-pay | Admitting: Cardiovascular Disease

## 2018-07-26 NOTE — Telephone Encounter (Signed)
Patient set up for MyChart? DECLINED  Is patient using Smartphone/computer/tablet?NO  Did audio/video work?  Does patient need telephone visit? YES  Best phone number to use?  [phone]   Special Instructions? PATIENT WILL HAVE VITALS FOR APPT      Virtual Visit Pre-Appointment Phone Call  "(Name), I am calling you today to discuss your upcoming appointment. We are currently trying to limit exposure to the virus that causes COVID-19 by seeing patients at home rather than in the office."  1. "What is the BEST phone number to call the day of the visit?" - include this in appointment notes  2. Do you have or have access to (through a family member/friend) a smartphone with video capability that we can use for your visit?" a. If yes - list this number in appt notes as cell (if different from BEST phone #) and list the appointment type as a VIDEO visit in appointment notes b. If no - list the appointment type as a PHONE visit in appointment notes  3. Confirm consent - "In the setting of the current Covid19 crisis, you are scheduled for a (phone or video) visit with your provider on (date) at (time).  Just as we do with many in-office visits, in order for you to participate in this visit, we must obtain consent.  If you'd like, I can send this to your mychart (if signed up) or email for you to review.  Otherwise, I can obtain your verbal consent now.  All virtual visits are billed to your insurance company just like a normal visit would be.  By agreeing to a virtual visit, we'd like you to understand that the technology does not allow for your provider to perform an examination, and thus may limit your provider's ability to fully assess your condition. If your provider identifies any concerns that need to be evaluated in person, we will make arrangements to do so.  Finally, though the technology is pretty good, we cannot assure that it will always work on either your or our end, and in the  setting of a video visit, we may have to convert it to a phone-only visit.  In either situation, we cannot ensure that we have a secure connection.  Are you willing to proceed?" STAFF: Did the patient verbally acknowledge consent to telehealth visit? Document YES/NO here: YES  4. Advise patient to be prepared - "Two hours prior to your appointment, go ahead and check your blood pressure, pulse, oxygen saturation, and your weight (if you have the equipment to check those) and write them all down. When your visit starts, your provider will ask you for this information. If you have an Apple Watch or Kardia device, please plan to have heart rate information ready on the day of your appointment. Please have a pen and paper handy nearby the day of the visit as well."  5. Give patient instructions for MyChart download to smartphone OR Doximity/Doxy.me as below if video visit (depending on what platform provider is using)  6. Inform patient they will receive a phone call 15 minutes prior to their appointment time (may be from unknown caller ID) so they should be prepared to answer    [place]. has been deemed a candidate for a follow-up tele-health visit to limit community exposure during the Covid-19 pandemic. I spoke with the patient via phone to ensure availability of phone/video source, confirm preferred email & phone number, and discuss instructions  and expectations.  I reminded [name]. to be prepared with any vital sign and/or heart rhythm information that could potentially be obtained via home monitoring, at the time of his visit. I reminded [name]. to expect a phone call prior to his visit.  Howie Ill 07/26/2018 10:01 AM   INSTRUCTIONS FOR DOWNLOADING THE MYCHART APP TO SMARTPHONE  - The patient must first make sure to have activated MyChart and know their login information - If Apple, go to [employer] and type in MyChart in the search bar and  download the app. If Android, ask patient to go to [employer] and type in [place] in the search bar and download the app. The app is free but as with any other app downloads, their phone may require them to verify saved payment information or Apple/Android password.  - The patient will need to then log into the app with their MyChart username and password, and select [facility] as their healthcare provider to link the account. When it is time for your visit, go to the MyChart app, find appointments, and click Begin Video Visit. Be sure to Select Allow for your device to access the Microphone and Camera for your visit. You will then be connected, and your provider will be with you shortly.  **If they have any issues connecting, or need assistance please contact MyChart service desk [phone])**  **If using a computer, in order to ensure the best quality for their visit they will need to use either of the following Internet Browsers: [employer], or Google Chrome**  IF USING DOXIMITY or DOXY.ME - The patient will receive a link just prior to their visit by text.     FULL LENGTH CONSENT FOR TELE-HEALTH VISIT   I hereby voluntarily request, consent and authorize [facility] and its employed or contracted physicians, physician assistants, nurse practitioners or other licensed health care professionals (the Practitioner), to provide me with telemedicine health care services (the Services") as deemed necessary by the treating Practitioner. I acknowledge and consent to receive the Services by the Practitioner via telemedicine. I understand that the telemedicine visit will involve communicating with the Practitioner through live audiovisual communication technology and the disclosure of certain medical information by electronic transmission. I acknowledge that I have been given the opportunity to request an in-person assessment or other available alternative prior to the  telemedicine visit and am voluntarily participating in the telemedicine visit.  I understand that I have the right to withhold or withdraw my consent to the use of telemedicine in the course of my care at any time, without affecting my right to future care or treatment, and that the Practitioner or I may terminate the telemedicine visit at any time. I understand that I have the right to inspect all information obtained and/or recorded in the course of the telemedicine visit and may receive copies of available information for a reasonable fee.  I understand that some of the potential risks of receiving the Services via telemedicine include:   Delay or interruption in medical evaluation due to technological equipment failure or disruption;  Information transmitted may not be sufficient (e.g. poor resolution of images) to allow for appropriate medical decision making by the Practitioner; and/or   In rare instances, security protocols could fail, causing a breach of personal health information.  Furthermore, I acknowledge that it is my responsibility to provide information about my medical history, conditions and care that is complete and accurate to  the best of my ability. I acknowledge that Practitioner's advice, recommendations, and/or decision may be based on factors not within their control, such as incomplete or inaccurate data provided by me or distortions of diagnostic images or specimens that may result from electronic transmissions. I understand that the practice of medicine is not an exact science and that Practitioner makes no warranties or guarantees regarding treatment outcomes. I acknowledge that I will receive a copy of this consent concurrently upon execution via email to the email address I last provided but may also request a printed copy by calling the office of [facility].    I understand that my insurance will be billed for this visit.   I have read or had this consent read to  me.  I understand the contents of this consent, which adequately explains the benefits and risks of the Services being provided via telemedicine.   I have been provided ample opportunity to ask questions regarding this consent and the Services and have had my questions answered to my satisfaction.  I give my informed consent for the services to be provided through the use of telemedicine in my medical care  By participating in this telemedicine visit I agree to the above.

## 2018-07-27 ENCOUNTER — Encounter: Payer: Self-pay | Admitting: Cardiovascular Disease

## 2018-07-27 ENCOUNTER — Telehealth (INDEPENDENT_AMBULATORY_CARE_PROVIDER_SITE_OTHER): Payer: Medicare Other | Admitting: Cardiovascular Disease

## 2018-07-27 ENCOUNTER — Other Ambulatory Visit: Payer: Self-pay

## 2018-07-27 ENCOUNTER — Other Ambulatory Visit (HOSPITAL_COMMUNITY): Payer: Self-pay | Admitting: Cardiovascular Disease

## 2018-07-27 VITALS — BP 129/78 | HR 71 | Ht 67.0 in | Wt 181.0 lb

## 2018-07-27 DIAGNOSIS — I25119 Atherosclerotic heart disease of native coronary artery with unspecified angina pectoris: Secondary | ICD-10-CM

## 2018-07-27 DIAGNOSIS — I480 Paroxysmal atrial fibrillation: Secondary | ICD-10-CM

## 2018-07-27 DIAGNOSIS — Z953 Presence of xenogenic heart valve: Secondary | ICD-10-CM

## 2018-07-27 DIAGNOSIS — I714 Abdominal aortic aneurysm, without rupture, unspecified: Secondary | ICD-10-CM

## 2018-07-27 NOTE — Progress Notes (Signed)
Virtual Visit via Telephone Note   This visit type was conducted due to national recommendations for restrictions regarding the COVID-19 Pandemic (e.g. social distancing) in an effort to limit this patient's exposure and mitigate transmission in our community.  Due to his co-morbid illnesses, this patient is at least at moderate risk for complications without adequate follow up.  This format is felt to be most appropriate for this patient at this time.  The patient did not have access to video technology/had technical difficulties with video requiring transitioning to audio format only (telephone).  All issues noted in this document were discussed and addressed.  No physical exam could be performed with this format.  Please refer to the patient's chart for his  consent to telehealth for [facility]   Date:  07/27/2018   ID:  [name]., DOB 10-19-1934, MRN [id number]  Patient Location: Home Provider Location: Home  PCP:  [name], MD  Cardiologist:  [name], MD  Electrophysiologist:  None   Evaluation Performed:  Follow-Up Visit  Chief Complaint: Coronary artery disease/aortic valve disease  History of Present Illness:    [name]. is a 84 y.o. male with coronary artery disease status post remote CABG, aortic valve disease status post TAVR in 2017, and history of sick sinus syndrome status post permanent pacemaker placement.  Today he presents for follow-up evaluation via a telephone visit in light of the current COVID-19 pandemic.  Video conferencing was not possible because of the patient's lack of video capable technology.  The patient does not have symptoms concerning for COVID-19 infection (fever, chills, cough, or new shortness of breath).   The patient reports stable cardiac symptoms.  He clearly has appreciated clinical improvement since he underwent PCI last year.  He has occasional angina but has not required any recent nitroglycerin.  He does  not feel limited by his cardiac symptoms.  He has mild shortness of breath with activity, also unchanged over time.  The patient has been diagnosed with esophageal cancer and he is undergone chemotherapy and radiation for treatment.  He is having some problems with reflux symptoms and is working with his primary care physician on treatment.  He denies lightheadedness, heart palpitations, or syncope.  He continues to follow with Dr. [name] as well.   Past Medical History:  Diagnosis Date   AAA (abdominal aortic aneurysm) ([place])    a. Last duplex - 3.8cm 01/2013 - due 01/2014.   Acne rosacea    Alcoholic hepatitis with ascites 08/28/2017   Anginal pain ([place]) 1996   Aortic stenosis, mild    Last echo 05/04/12 +LVH   Arthritis    "shoulders" (09/01/2017)   Back pain    hx epidural injections   Baker's cyst    Lt.   CAD (coronary artery disease)    a. s/p MI/PTCA - Cx 1994. b. s/p CABG x5 in 1997. c. Abnl nuc 2003- occ SVG-RCA, patent seq SVG-OM1-dCxOM, patentl LIMA-LAD, patent, SVG-diag. d. Normal nuc 09/2012.   Carotid artery disease ([place])    a. Duplex 01/2013: mildly abnormal. Mild plaque without significant diameter reduction. Repeat recommended 11/15.   COPD (chronic obstructive pulmonary disease) (HCC)    Dizziness and giddiness 08/04/2013   GERD (gastroesophageal reflux disease)    H/O: GI bleed    mild, neg. colonoscopy   Heart murmur    HOH (hard of hearing)    HTN (hypertension)    Hyperlipidemia    Hypertensive heart disease  Myocardial infarction ([place]) 1994   OSA on CPAP    OSA treated with BiPAP 11/20/2015   Severe with AHi 38/hr   PAF (paroxysmal atrial fibrillation) ([place])    a. Remote per records.    Pneumonia    history   Presence of permanent cardiac pacemaker    DOI 1999 with change out 2006; St Jude   Prostate cancer [facility]) ~ 2010   a. s/p radical retropubic prostatectomy with bilateral pelvic lymph node dissection   PVD (peripheral  vascular disease) ([place])    a. Rt ext iliac stenosis, last PV cath 2003, moderate stenosis last Lower Ext Dopplers 12/16/11 - Rt. ABI 0.96  Lt ABI 1.0.   Sick sinus syndrome ([place])    a. placed 1999, gen change 2011 - St. Jude.   Sinus node dysfunction (HCC) 01/20/2013   Syncope 11/28/97   Thrombocytopenia (HCC)    chronic, mild   Valvular heart disease    a. Echo 04/2012: mild-mod AS, mild AI, mild MR.   Past Surgical History:  Procedure Laterality Date   APPENDECTOMY     BIOPSY  10/20/2017   Procedure: BIOPSY;  Surgeon: Doran Stabler, MD;  Location: [facility];  Service: Gastroenterology;;   CARDIAC CATHETERIZATION  2003   VG to RCA occluded, other grafts patent   CARDIAC CATHETERIZATION N/A 05/04/2015   Procedure: Right/Left Heart Cath and Coronary/Graft Angiography;  Surgeon: Sherren Mocha, MD;  Location: [place] [facility];  Service: Cardiovascular;  Laterality: N/A;   CARDIAC CATHETERIZATION  09/01/2017   CARDIAC VALVE REPLACEMENT     CATARACT EXTRACTION W/ INTRAOCULAR LENS  IMPLANT, BILATERAL Bilateral    COLONOSCOPY     CORONARY ANGIOPLASTY  03/29/92   PTCA to LCX   CORONARY ARTERY BYPASS GRAFT  1997   LIMA-LAD; VG-diag; seq VG-1st OM & distal LCX; VG-RCA   CORONARY ATHERECTOMY N/A 09/01/2017   Procedure: CORONARY ATHERECTOMY;  Surgeon: Jettie Booze, MD;  Location: [place] [facility];  Service: Cardiovascular;  Laterality: N/A;   CORONARY ATHERECTOMY  10/01/2017   CORONARY ATHERECTOMY N/A 10/01/2017   Procedure: CORONARY ATHERECTOMY;  Surgeon: Leonie Man, MD;  Location: [place] [facility];  Service: Cardiovascular;  Laterality: N/A;   ESOPHAGOGASTRODUODENOSCOPY (EGD) WITH PROPOFOL N/A 10/20/2017   Procedure: ESOPHAGOGASTRODUODENOSCOPY (EGD) WITH PROPOFOL;  Surgeon: Doran Stabler, MD;  [facility]: [facility];  Service: Gastroenterology;  Laterality: N/A;   ESOPHAGOGASTRODUODENOSCOPY (EGD) WITH PROPOFOL N/A 03/29/2018   Procedure:  ESOPHAGOGASTRODUODENOSCOPY (EGD) WITH PROPOFOL;  Surgeon: Doran Stabler, MD;  [facility]: [facility];  Service: Gastroenterology;  Laterality: N/A;   INSERT / REPLACE / REMOVE PACEMAKER  11/28/1997   pacesetter--ERI 2011   LEFT HEART CATH N/A 09/01/2017   Procedure: Left Heart Cath;  Surgeon: Jettie Booze, MD;  Location: [place] [facility];  Service: Cardiovascular;  Laterality: N/A;   LEFT HEART CATH AND CORS/GRAFTS ANGIOGRAPHY N/A 08/28/2017   Procedure: LEFT HEART CATH AND CORS/GRAFTS ANGIOGRAPHY;  Surgeon: Leonie Man, MD;  Location: [place] [facility];  Service: Cardiovascular;  Laterality: N/A;   PACEMAKER GENERATOR CHANGE  08/15/2009   St. Jude accent   SUPRAPUBIC PROSTATECTOMY     TEE WITHOUT CARDIOVERSION N/A 06/12/2015   Procedure: TRANSESOPHAGEAL ECHOCARDIOGRAM (TEE);  Surgeon: Sherren Mocha, MD;  Location: [place];  Service: Open Heart Surgery;  Laterality: N/A;   TONSILLECTOMY     TRANSCATHETER AORTIC VALVE REPLACEMENT, TRANSFEMORAL N/A 06/12/2015   Procedure: TRANSCATHETER AORTIC VALVE REPLACEMENT, TRANSFEMORAL, possible transpical;  Surgeon: Sherren Mocha, MD;  Location: [place];  Service: Open Heart Surgery;  Laterality: N/A;     Current Meds  Medication Sig   acetaminophen (TYLENOL) 325 MG tablet Take 650 mg by mouth 2 (two) times daily.   atorvastatin (LIPITOR) 40 MG tablet Take 1 tablet (40 mg total) by mouth daily at 6 PM.   clobetasol cream (TEMOVATE) 0.05 %    clopidogrel (PLAVIX) 75 MG tablet Take 1 tablet (75 mg total) by mouth daily.   diclofenac sodium (VOLTAREN) 1 % GEL    famotidine (PEPCID) 20 MG tablet TAKE 1 TABLET BY MOUTH EVERY 12 HOURS FOR ACID REFULX   losartan (COZAAR) 25 MG tablet Take 1 tablet (25 mg total) by mouth daily.   metoprolol tartrate (LOPRESSOR) 25 MG tablet TAKE 1 TABLET BY MOUTH TWICE DAILY   nitroGLYCERIN (NITROSTAT) 0.4 MG SL tablet Place 1 tablet (0.4 mg total) under the tongue every 5 (five) minutes as needed  for chest pain.   Omega-3 Fatty Acids (FISH OIL) 1200 MG CAPS Take 1,200 mg by mouth every evening.   pantoprazole (PROTONIX) 40 MG tablet Take 1 tablet (40 mg total) by mouth daily.   vitamin C (ASCORBIC ACID) 500 MG tablet Take 500 mg by mouth every evening.     Allergies:   Patient has no known allergies.   Social History   Tobacco Use   Smoking status: Former Smoker    Packs/day: 2.00    Years: 48.00    Pack years: 96.00    Types: Cigarettes    Last attempt to quit: 03/18/1995    Years since quitting: 23.3   Smokeless tobacco: Never Used  Substance Use Topics   Alcohol use: No    Comment: 09/01/2017 "nothing in the last couple years"   Drug use: Never     Family Hx: [name] family history includes CAD in an other family member; Cancer in his brother; Colon cancer in his father; Heart Problems in his brother and sister.  ROS:   Please see the history of present illness.    Positive for reflux/dyspepsia, hiccups.  All other systems reviewed and are negative.   Prior CV studies:   The following studies were reviewed today:  2D Echo 08-26-2017: Study Conclusions  - Left ventricle: The cavity size was normal. There was mild   concentric hypertrophy. Systolic function was mildly reduced. The   estimated ejection fraction was in the range of 45% to 50%.   Hypokinesis of the inferoseptal myocardium; consistent with   ischemia in the distribution of the right coronary artery.   Hypokinesis of the apicalanteroseptal myocardium; consistent with   ischemia in the distribution of the left anterior descending   coronary artery; new since the study of 07/07/2016. Features are   consistent with a pseudonormal left ventricular filling pattern,   with concomitant abnormal relaxation and increased filling   pressure (grade 2 diastolic dysfunction). - Ventricular septum: Septal motion showed abnormal function and   dyssynergy. These changes are consistent with right  ventricular   pacing. - Aortic valve: A bioprosthesis was present and functioning   normally. Valve area (VTI): 1.79 cm^2. Valve area (Vmax): 1.63   cm^2. Valve area (Vmean): 1.54 cm^2. - Mitral valve: Calcified annulus. - Left atrium: The atrium was mildly dilated. - Pulmonary arteries: PA peak pressure: 38 mm Hg (S).  Impressions:  - Left ventricular systolic function has worsened since April 2018.   Due to technical quality of the images and  profound   pacing-induced dyssynchrony, wall motion analysis is challenging.   Nevertheless, there appears to be a new inferoseptal and   apicoseptal wall motion abnormality.  Left ventricle:  The cavity size was normal. There was mild concentric hypertrophy. Systolic function was mildly reduced. The estimated ejection fraction was in the range of 45% to 50%. Regional wall motion abnormalities:   Hypokinesis of the inferoseptal myocardium; consistent with ischemia in the distribution of the right coronary artery.  Hypokinesis of the apicalanteroseptal myocardium; consistent with ischemia in the distribution of the left anterior descending coronary artery; new since the study of 07/07/2016. Features are consistent with a pseudonormal left ventricular filling pattern, with concomitant abnormal relaxation and increased filling pressure (grade 2 diastolic dysfunction).  ------------------------------------------------------------------- Aortic valve:  A bioprosthesis was present and functioning normally.  Doppler:  There was no significant regurgitation.    VTI ratio of LVOT to aortic valve: 0.7. Valve area (VTI): 1.79 cm^2. Indexed valve area (VTI): 0.86 cm^2/m^2. Peak velocity ratio of LVOT to aortic valve: 0.64. Valve area (Vmax): 1.63 cm^2. Indexed valve area (Vmax): 0.79 cm^2/m^2. Mean velocity ratio of LVOT to aortic valve: 0.61. Valve area (Vmean): 1.54 cm^2. Indexed valve area (Vmean): 0.74 cm^2/m^2.    Mean gradient (S): 13 mm  Hg. Peak gradient (S): 28 mm Hg.  ------------------------------------------------------------------- Aorta:  Aortic root: The aortic root was normal in size. Ascending aorta: The ascending aorta was normal in size.  ------------------------------------------------------------------- Mitral valve:   Calcified annulus. Leaflet separation was normal. Doppler:  Transvalvular velocity was within the normal range. There was no evidence for stenosis. There was no regurgitation.  ------------------------------------------------------------------- Left atrium:  The atrium was mildly dilated.  ------------------------------------------------------------------- Right ventricle:  The cavity size was normal. Pacer wire or catheter noted in right ventricle. Systolic function was normal.   ------------------------------------------------------------------- Ventricular septum:   Septal motion showed abnormal function and dyssynergy. These changes are consistent with right ventricular pacing.  ------------------------------------------------------------------- Pulmonic valve:   Poorly visualized.  Structurally normal valve. The valve appears to be grossly normal.   Cusp separation was normal.  Doppler:  Transvalvular velocity was within the normal range. There was no regurgitation.  ------------------------------------------------------------------- Tricuspid valve:   Structurally normal valve.   Leaflet separation was normal.  Doppler:  Transvalvular velocity was within the normal range. There was trivial regurgitation.  ------------------------------------------------------------------- Right atrium:  The atrium was normal in size. Pacer wire or catheter noted in right atrium.  ------------------------------------------------------------------- Pericardium:  There was no pericardial effusion.  Myocardial Perfusion Study 03/03/2018: Study Highlights     The left ventricular  ejection fraction is mildly decreased (45-54%).  Nuclear stress EF: 45%. There is anteroseptal inferoseptal akinesis  There was no ST segment deviation noted during stress.  Defect 1: There is a large defect of moderate severity present in the basal anteroseptal, basal inferoseptal, mid anteroseptal, mid inferoseptal, apical anterior, apical septal and apex location.  This is an intermediate risk study. There is a large area of anteroseptal/inferoseptal decreased uptake seen at both rest and stress with no significant reversibility. Post CABG.   Candee Furbish, MD   PCI 10-01-2017: Conclusion     Ost RCA to Prox RCA lesion is 95% stenosed. Post Rotational Atherectomy intervention, there is a 50% residual stenosis.  A drug-eluting stent was successfully placed using a STENT SYNERGY DES 3X24. post-dilated to 3.6 mm  Post intervention, there is a 0% residual stenosis.  Prox RCA lesion is 30% stenosed just beyond the stent.   Successful difficult  PCI of ostial-proximal RCA with rotational atherectomy and DES stent placement.  Plan: Overnight observation post PCI. Sheath removal in 6 Central post procedure unit. Continue antianginal medications and risk factor modification.  Recommend dual antiplatelet therapy with Aspirin 81mg  daily and Clopidogrel 75mg  daily long-term (beyond 12 months) because of Ostial stent and diffuse disease elsewhere.  Would be okay to stop aspirin after 3-6 months..     Labs/Other Tests and Data Reviewed:    EKG:  An ECG dated 04/22/2018 was personally reviewed today and demonstrated:  Sinus rhythm 72 bpm, left bundle branch block, no significant change from previous tracings  Recent Labs: 08/25/2017: B Natriuretic Peptide 121.7 12/15/2017: ALT 32 12/28/2017: BUN 21; Creatinine, Ser 0.94; Potassium 4.0; Sodium 140; TSH 1.970 04/23/2018: Hemoglobin 10.4; Platelet Count 56   Recent Lipid Panel Lab Results  Component Value Date/Time   CHOL 120 10/22/2017  11:54 AM   TRIG 144 10/22/2017 11:54 AM   HDL 43 10/22/2017 11:54 AM   CHOLHDL 2.8 10/22/2017 11:54 AM   CHOLHDL 3.3 04/26/2015 04:20 PM   LDLCALC 48 10/22/2017 11:54 AM    Wt Readings from Last 3 Encounters:  07/27/18 181 lb (82.1 kg)  04/23/18 188 lb 9.6 oz (85.5 kg)  04/22/18 189 lb 9.6 oz (86 kg)     Objective:    Vital Signs:  BP 129/78    Pulse 71    Ht 5\' 7"  (1.702 m)    Wt 181 lb (82.1 kg)    BMI 28.35 kg/m   Full exam not possible as this is a telephone conference today. VITAL SIGNS:  reviewed  ASSESSMENT & PLAN:    1. Coronary artery disease, native vessel, with angina: The patient has stable CCS class II symptoms of chronic angina.  I reviewed his medical program which includes clopidogrel for antiplatelet therapy a high intensity statin drug, and a beta-blocker. 2. Aortic valve disease status post TAVR: Most recent echo reviewed with normal function of his transcatheter heart valve.  Continue clinical follow-up.   COVID-19 Education: The signs and symptoms of COVID-19 were discussed with the patient and how to seek care for testing (follow up with PCP or arrange E-visit). The importance of social distancing was discussed today.  Time:   Today, I have spent 15 minutes with the patient with telehealth technology discussing the above problems.     Medication Adjustments/Labs and Tests Ordered: Current medicines are reviewed at length with the patient today.  Concerns regarding medicines are outlined above.   Tests Ordered: No orders of the defined types were placed in this encounter.   Medication Changes: No orders of the defined types were placed in this encounter.   Disposition:  Follow up prn  Signed, Sherren Mocha, MD  07/27/2018 11:25 AM    [place]

## 2018-08-13 ENCOUNTER — Ambulatory Visit: Payer: Medicare Other | Admitting: Cardiovascular Disease

## 2018-08-19 ENCOUNTER — Other Ambulatory Visit: Payer: Self-pay

## 2018-08-19 ENCOUNTER — Inpatient Hospital Stay (HOSPITAL_BASED_OUTPATIENT_CLINIC_OR_DEPARTMENT_OTHER): Payer: Medicare Other | Admitting: Nurse Practitioner

## 2018-08-19 ENCOUNTER — Encounter: Payer: Self-pay | Admitting: Nurse Practitioner

## 2018-08-19 ENCOUNTER — Inpatient Hospital Stay: Payer: Medicare Other | Attending: Oncology

## 2018-08-19 VITALS — BP 140/63 | HR 73 | Temp 98.9°F | Resp 17 | Ht 67.0 in | Wt 188.2 lb

## 2018-08-19 DIAGNOSIS — Z950 Presence of cardiac pacemaker: Secondary | ICD-10-CM | POA: Diagnosis not present

## 2018-08-19 DIAGNOSIS — R1310 Dysphagia, unspecified: Secondary | ICD-10-CM | POA: Diagnosis not present

## 2018-08-19 DIAGNOSIS — J449 Chronic obstructive pulmonary disease, unspecified: Secondary | ICD-10-CM

## 2018-08-19 DIAGNOSIS — I739 Peripheral vascular disease, unspecified: Secondary | ICD-10-CM

## 2018-08-19 DIAGNOSIS — I2510 Atherosclerotic heart disease of native coronary artery without angina pectoris: Secondary | ICD-10-CM | POA: Insufficient documentation

## 2018-08-19 DIAGNOSIS — C154 Malignant neoplasm of middle third of esophagus: Secondary | ICD-10-CM | POA: Diagnosis present

## 2018-08-19 DIAGNOSIS — Z8546 Personal history of malignant neoplasm of prostate: Secondary | ICD-10-CM | POA: Diagnosis not present

## 2018-08-19 LAB — CBC WITH DIFFERENTIAL (CANCER CENTER ONLY)
Abs Immature Granulocytes: 0 10*3/uL (ref 0.00–0.07)
Basophils Absolute: 0 10*3/uL (ref 0.0–0.1)
Basophils Relative: 0 %
Eosinophils Absolute: 0.1 10*3/uL (ref 0.0–0.5)
Eosinophils Relative: 2 %
HCT: 33.2 % — ABNORMAL LOW (ref 39.0–52.0)
Hemoglobin: 11.3 g/dL — ABNORMAL LOW (ref 13.0–17.0)
Immature Granulocytes: 0 %
Lymphocytes Relative: 22 %
Lymphs Abs: 0.6 10*3/uL — ABNORMAL LOW (ref 0.7–4.0)
MCH: 33.2 pg (ref 26.0–34.0)
MCHC: 34 g/dL (ref 30.0–36.0)
MCV: 97.6 fL (ref 80.0–100.0)
Monocytes Absolute: 0.4 10*3/uL (ref 0.1–1.0)
Monocytes Relative: 17 %
Neutro Abs: 1.5 10*3/uL — ABNORMAL LOW (ref 1.7–7.7)
Neutrophils Relative %: 59 %
Platelet Count: 61 10*3/uL — ABNORMAL LOW (ref 150–400)
RBC: 3.4 MIL/uL — ABNORMAL LOW (ref 4.22–5.81)
RDW: 13.7 % (ref 11.5–15.5)
WBC Count: 2.5 10*3/uL — ABNORMAL LOW (ref 4.0–10.5)
nRBC: 0 % (ref 0.0–0.2)

## 2018-08-19 NOTE — Progress Notes (Signed)
  [facility] OFFICE PROGRESS NOTE   Diagnosis: Esophagus cancer, pancytopenia  INTERVAL HISTORY:   [name] returns as scheduled.  He overall feels well.  No fever, cough, shortness of breath.  He reports food and medications intermittently get "stuck".  He notes "acid reflux" and hiccups 3-4 times during the day and at nighttime.  He recently began cimetidine and has noted no improvement.  He has a good appetite.  No regurgitation or vomiting.  Objective:  Vital signs in last 24 hours:  Blood pressure 140/63, pulse 73, temperature 98.9 F (37.2 C), temperature source Oral, resp. rate 17, height 5\' 7"  (1.702 m), weight 188 lb 3.2 oz (85.4 kg), SpO2 95 %.    Lymphatics: No palpable cervical, supraclavicular or axillary lymph nodes. GI: Abdomen soft and nontender.  No hepatosplenomegaly. Vascular: No leg edema.   Lab Results:  Lab Results  Component Value Date   WBC 2.5 (L) 08/19/2018   HGB 11.3 (L) 08/19/2018   HCT 33.2 (L) 08/19/2018   MCV 97.6 08/19/2018   PLT 61 (L) 08/19/2018   NEUTROABS 1.5 (L) 08/19/2018    Imaging:  No results found.  Medications: I have reviewed the patient's current medications.  Assessment/Plan: 1. 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 Staging CTs 10/26/2017-asymmetric esophageal thickening, no evidence of metastatic disease ? Endoscopic biopsy 10/20/2017-mid esophagus partially obstructing mass, biopsy confirmed squamous cell carcinoma ? PET scan 11/02/2017-wall thickening in the mid esophagus SUV 27 MA: No findings specific for metastatic disease ? Radiation 11/09/2017-12/17/2017 ? Cycle 1 weekly Taxol/carboplatin 11/10/2017 ? Cycle 2 weekly Taxol/carboplatin 11/17/2017 (carboplatin held due to thrombocytopenia) ? Cycle 3 weekly Taxol/carboplatin 12/08/2017 (dosesreduced due to neutropenia, thrombocytopenia) ? Cycle 4 weekly Taxol/carboplatin 12/15/2017 ? Endoscopy 03/29/2018- no mass seen 2. Solid  dysphagia and odynophagia secondary to #1-improved 3. Coronary artery disease 4. History of prostate cancer 5. COPD 6. Permanent cardiac pacemaker 7. Peripheral vascular disease 8. Chronic thrombocytopenia 9. 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 CT chest 12/28/2017-no evidence of pulmonary emboli. Esophageal thickening consistent with patient's history of esophageal carcinoma. 11. Probable cirrhosis, referred to GI   Disposition: [name] appears stable.  He is experiencing mild dysphagia and reflux symptoms.  We made a referral to Dr. Loletha Carrow.  We reviewed the CBC from today.  Counts overall have improved.  He will return for lab and follow-up in 3 months.  He will contact the office in the interim with any problems.  Plan reviewed with Dr. Benay Spice.    Ned Card ANP/GNP-BC   08/19/2018  10:27 AM

## 2018-08-20 ENCOUNTER — Telehealth: Payer: Self-pay | Admitting: Oncology

## 2018-08-20 NOTE — Telephone Encounter (Signed)
Scheduled appt per 6/4 los. ° °A calendar will be mailed out. °

## 2018-08-25 ENCOUNTER — Encounter: Payer: Medicare Other | Admitting: Cardiovascular Disease

## 2018-08-27 ENCOUNTER — Other Ambulatory Visit: Payer: Self-pay

## 2018-08-27 ENCOUNTER — Encounter: Payer: Self-pay | Admitting: Gastroenterology

## 2018-08-27 ENCOUNTER — Ambulatory Visit (INDEPENDENT_AMBULATORY_CARE_PROVIDER_SITE_OTHER): Payer: Medicare Other | Admitting: Gastroenterology

## 2018-08-27 VITALS — Ht 68.0 in | Wt 182.0 lb

## 2018-08-27 DIAGNOSIS — R143 Flatulence: Secondary | ICD-10-CM

## 2018-08-27 DIAGNOSIS — K219 Gastro-esophageal reflux disease without esophagitis: Secondary | ICD-10-CM | POA: Diagnosis not present

## 2018-08-27 NOTE — Progress Notes (Signed)
This patient contacted our office requesting a physician telemedicine consultation regarding clinical questions and/or test results.  Interactive audio and video telecommunications were attempted between this provider and the patient.  However, this technology failed due to the patient having technical difficulties OR they did not have access to video capabilities.  We continued and completed the visit with audio only.   Participants on the conference : myself and patient   The patient consented to this consultation and was aware that a charge will be placed through their insurance.  I was in my office and the patient was at home   Encounter time:  Total time 18 minutes, with 15 minutes spent with patient on phone   _____________________________________________________________________________________________           [name] GI Progress Note  Chief Complaint: History of esophageal squamous cell carcinoma  Subjective  History: [name] was last seen on January 13 for upper endoscopy in order to assess response to his chemotherapy and radiation for squamous cell carcinoma.  He appears to have had an excellent response, with no residual tumor seen endoscopically. Oncology follow-up note from 08/19/2018 was reviewed.  Patient was describing intermittent heartburn and dysphagia, thus was referred back to [place]. Cardiology telemedicine note with Dr. Burt Knack on 07/27/2018 was reviewed.  Patient with stable class II angina symptoms, also status post TAVR with most recent echocardiogram showing normal valve function.  Post-prandial regurgitation causing hiccups several times. Passes in under a minute. Does not feel PPI helping much.  No dysphagia or pain with swallowing.  Occurs 3-4 times/day, might wake him from sleep.  Years of excessive bowel gas.  ROS: Cardiovascular: intermittent exertional chest pain Respiratory: no dyspnea Weight stable   the patient's Past Medical, Family and Social  History were reviewed and are on file in the EMR.  Objective:  Med list reviewed  Current Outpatient Medications:  .  acetaminophen (TYLENOL) 325 MG tablet, Take 650 mg by mouth 2 (two) times daily., Disp: , Rfl:  .  atorvastatin (LIPITOR) 40 MG tablet, Take 1 tablet (40 mg total) by mouth daily at 6 PM., Disp: 30 tablet, Rfl: 0 .  clobetasol cream (TEMOVATE) 0.05 %, , Disp: , Rfl:  .  clopidogrel (PLAVIX) 75 MG tablet, Take 1 tablet (75 mg total) by mouth daily., Disp: 30 tablet, Rfl: 0 .  diclofenac sodium (VOLTAREN) 1 % GEL, , Disp: , Rfl:  .  famotidine (PEPCID) 20 MG tablet, TAKE 1 TABLET BY MOUTH EVERY 12 HOURS FOR ACID REFULX, Disp: , Rfl:  .  losartan (COZAAR) 25 MG tablet, Take 1 tablet (25 mg total) by mouth daily., Disp: 90 tablet, Rfl: 3 .  metoprolol tartrate (LOPRESSOR) 25 MG tablet, TAKE 1 TABLET BY MOUTH TWICE DAILY, Disp: 180 tablet, Rfl: 3 .  nitroGLYCERIN (NITROSTAT) 0.4 MG SL tablet, Place 1 tablet (0.4 mg total) under the tongue every 5 (five) minutes as needed for chest pain., Disp: 25 tablet, Rfl: 2 .  Omega-3 Fatty Acids (FISH OIL) 1200 MG CAPS, Take 1,200 mg by mouth every evening., Disp: , Rfl:  .  pantoprazole (PROTONIX) 40 MG tablet, Take 1 tablet (40 mg total) by mouth daily., Disp: 90 tablet, Rfl: 3 .  vitamin C (ASCORBIC ACID) 500 MG tablet, Take 500 mg by mouth every evening., Disp: , Rfl:   Cimetidine Rx by PCP (he was uncertain of all his meds) - sounds like it was added to the PPI Has been taking once daily protonix since surgery  No exam - virtual visit   @ASSESSMENTPLANBEGIN @ Assessment: Encounter Diagnoses  Name Primary?  . Gastroesophageal reflux disease without esophagitis Yes  . Flatulence    This sounds like uncomplicated reflux, common after chemotherapy and radiation that typically affects upper GI motility.  He probably has poor bolus clearance from the esophagus due to the radiation.  It does not sound likely to be recurrence of  malignancy, though I cannot tell from his notes when they plan to restage him.  I did my best to explain to collide the limitation of acid suppression therapy and the need for diet and lifestyle changes.  I reviewed some of them, it is not clear how much of it he understood.  We will mail him some reading material about this.  Small portions, do not eat within several hours of bed, elevate head of bed.  Stop the cimetidine since it does not sound like it is adding anything to the once daily Protonix.  Remain on PPI indefinitely to prevent reflux esophagitis and stricture formation.  No plans for upper endoscopy at this point.  He is a medically complex patient with increased risk for sedation and procedure related complications.  If he has progressive dysphagia or other concerns from the hematology service that there may be recurrence of malignancy, this can be reconsidered.   [name]

## 2018-08-27 NOTE — Patient Instructions (Addendum)
Food Guidelines for gas and bloating  Many people have difficulty digesting certain foods, causing a variety of distressing and embarrassing symptoms such as abdominal pain, bloating and gas.  These foods may need to be avoided or consumed in small amounts.  Here are some tips that might be helpful for you.  1.   Lactose intolerance is the difficulty or complete inability to digest lactose, the natural sugar in milk and anything made from milk.  This condition is harmless, common, and can begin any time during life.  Some people can digest a modest amount of lactose while others cannot tolerate any.  Also, not all dairy products contain equal amounts of lactose.  For example, hard cheeses such as parmesan have less lactose than soft cheeses such as cheddar.  Yogurt has less lactose than milk or cheese.  Many packaged foods (even many brands of bread) have milk, so read ingredient lists carefully.  It is difficult to test for lactose intolerance, so just try avoiding lactose as much as possible for a week and see what happens with your symptoms.  If you seem to be lactose intolerant, the best plan is to avoid it (but make sure you get calcium from another source).  The next best thing is to use lactase enzyme supplements, available over the counter everywhere.  Just know that many lactose intolerant people need to take several tablets with each serving of dairy to avoid symptoms.  Lastly, a lot of restaurant food is made with milk or butter.  Many are things you might not suspect, such as mashed potatoes, rice and pasta (cooked with butter) and "grilled" items.  If you are lactose intolerant, it never hurts to ask your server what has milk or butter.  2.   Fiber is an important part of your diet, but not all fiber is well-tolerated.  Insoluble fiber such as bran is often consumed by normal gut bacteria and converted into gas.  Soluble fiber such as oats, squash, carrots and green beans are typically tolerated  better.  3.   Some types of carbohydrates can be poorly digested.  Examples include: fructose (apples, cherries, pears, raisins and other dried fruits), fructans (onions, zucchini, large amounts of wheat), sorbitol/mannitol/xylitol and sucralose/Splenda (common artificial sweeteners), and raffinose (lentils, broccoli, cabbage, asparagus, brussel sprouts, many types of beans).  Do a Development worker, community for [employer] and you will find helpful information. Beano, a dietary supplement, will often help with raffinose-containing foods.  As with lactase tablets, you may need several per serving.  4.   Whenever possible, avoid processed food&meats and chemical additives.  High fructose corn syrup, a common sweetener, may be difficult to digest.  Eggs and soy (comes from the soybean, and added to many foods now) are other common bloating/gassy foods.   Abdominal Bloating When you have abdominal bloating, your abdomen may feel full, tight, or painful. It may also look bigger than normal or swollen (distended). Common causes of abdominal bloating include:  Swallowing air.  Constipation.  Problems digesting food.  Eating too much.  Irritable bowel syndrome. This is a condition that affects the large intestine.  Lactose intolerance. This is an inability to digest lactose, a natural sugar in dairy products.  Celiac disease. This is a condition that affects the ability to digest gluten, a protein found in some grains.  Gastroparesis. This is a condition that slows down the movement of food in the stomach and small intestine. It is more common in people with  diabetes mellitus.  Gastroesophageal reflux disease (GERD). This is a digestive condition that makes stomach acid flow back into the esophagus.  Urinary retention. This means that the body is holding onto urine, and the bladder cannot be emptied all the way. Follow these instructions at home: Eating and drinking  Avoid eating too much.  Try not to  swallow air while talking or eating.  Avoid eating while lying down.  Avoid these foods and drinks: ? Foods that cause gas, such as broccoli, cabbage, cauliflower, and baked beans. ? Carbonated drinks. ? Hard candy. ? Chewing gum. Medicines  Take over-the-counter and prescription medicines only as told by your health care provider.  Take probiotic medicines. These medicines contain live bacteria or yeasts that can help digestion.  Take coated peppermint oil capsules. Activity  Try to exercise regularly. Exercise may help to relieve bloating that is caused by gas and relieve constipation. General instructions  Keep all follow-up visits as told by your health care provider. This is important. Contact a health care provider if:  You have nausea and vomiting.  You have diarrhea.  You have abdominal pain.  You have unusual weight loss or weight gain.  You have severe pain, and medicines do not help. Get help right away if:  You have severe chest pain.  You have trouble breathing.  You have shortness of breath.  You have trouble urinating.  You have darker urine than normal.  You have blood in your stools or have dark, tarry stools. Summary  Abdominal bloating means that the abdomen is swollen.  Common causes of abdominal bloating are swallowing air, constipation, and problems digesting food.  Avoid eating too much and avoid swallowing air.  Avoid foods that cause gas, carbonated drinks, hard candy, and chewing gum. This information is not intended to replace advice given to you by your health care provider. Make sure you discuss any questions you have with your health care provider. Document Released: 04/04/2016 Document Revised: 04/04/2016 Document Reviewed: 04/04/2016 Elsevier Interactive Patient Education  2019 [employer].  Gastroesophageal Reflux Disease, Adult Gastroesophageal reflux (GER) happens when acid from the stomach flows up into the tube that  connects the mouth and the stomach (esophagus). Normally, food travels down the esophagus and stays in the stomach to be digested. With GER, food and stomach acid sometimes move back up into the esophagus. You may have a disease called gastroesophageal reflux disease (GERD) if the reflux:  Happens often.  Causes frequent or very bad symptoms.  Causes problems such as damage to the esophagus. When this happens, the esophagus becomes sore and swollen (inflamed). Over time, GERD can make small holes (ulcers) in the lining of the esophagus. What are the causes? This condition is caused by a problem with the muscle between the esophagus and the stomach. When this muscle is weak or not normal, it does not close properly to keep food and acid from coming back up from the stomach. The muscle can be weak because of:  Tobacco use.  Pregnancy.  Having a certain type of hernia (hiatal hernia).  Alcohol use.  Certain foods and drinks, such as coffee, chocolate, onions, and peppermint. What increases the risk? You are more likely to develop this condition if you:  Are overweight.  Have a disease that affects your connective tissue.  Use NSAID medicines. What are the signs or symptoms? Symptoms of this condition include:  Heartburn.  Difficult or painful swallowing.  The feeling of having a lump in the  throat.  A bitter taste in the mouth.  Bad breath.  Having a lot of saliva.  Having an upset or bloated stomach.  Belching.  Chest pain. Different conditions can cause chest pain. Make sure you see your doctor if you have chest pain.  Shortness of breath or noisy breathing (wheezing).  Ongoing (chronic) cough or a cough at night.  Wearing away of the surface of teeth (tooth enamel).  Weight loss. How is this treated? Treatment will depend on how bad your symptoms are. Your doctor may suggest:  Changes to your diet.  Medicine.  Surgery. Follow these instructions at  home: Eating and drinking   Follow a diet as told by your doctor. You may need to avoid foods and drinks such as: ? Coffee and tea (with or without caffeine). ? Drinks that contain alcohol. ? Energy drinks and sports drinks. ? Bubbly (carbonated) drinks or sodas. ? Chocolate and cocoa. ? Peppermint and mint flavorings. ? Garlic and onions. ? Horseradish. ? Spicy and acidic foods. These include peppers, chili powder, curry powder, vinegar, hot sauces, and BBQ sauce. ? Citrus fruit juices and citrus fruits, such as oranges, lemons, and limes. ? Tomato-based foods. These include red sauce, chili, salsa, and pizza with red sauce. ? Fried and fatty foods. These include donuts, french fries, potato chips, and high-fat dressings. ? High-fat meats. These include hot dogs, rib eye steak, sausage, ham, and bacon. ? High-fat dairy items, such as whole milk, butter, and cream cheese.  Eat small meals often. Avoid eating large meals.  Avoid drinking large amounts of liquid with your meals.  Avoid eating meals during the 2-3 hours before bedtime.  Avoid lying down right after you eat.  Do not exercise right after you eat. Lifestyle   Do not use any products that contain nicotine or tobacco. These include cigarettes, e-cigarettes, and chewing tobacco. If you need help quitting, ask your doctor.  Try to lower your stress. If you need help doing this, ask your doctor.  If you are overweight, lose an amount of weight that is healthy for you. Ask your doctor about a safe weight loss goal. General instructions  Pay attention to any changes in your symptoms.  Take over-the-counter and prescription medicines only as told by your doctor. Do not take aspirin, ibuprofen, or other NSAIDs unless your doctor says it is okay.  Wear loose clothes. Do not wear anything tight around your waist.  Raise (elevate) the head of your bed about 6 inches (15 cm).  Avoid bending over if this makes your  symptoms worse.  Keep all follow-up visits as told by your doctor. This is important. Contact a doctor if:  You have new symptoms.  You lose weight and you do not know why.  You have trouble swallowing or it hurts to swallow.  You have wheezing or a cough that keeps happening.  Your symptoms do not get better with treatment.  You have a hoarse voice. Get help right away if:  You have pain in your arms, neck, jaw, teeth, or back.  You feel sweaty, dizzy, or light-headed.  You have chest pain or shortness of breath.  You throw up (vomit) and your throw-up looks like blood or coffee grounds.  You pass out (faint).  Your poop (stool) is bloody or black.  You cannot swallow, drink, or eat. Summary  If a person has gastroesophageal reflux disease (GERD), food and stomach acid move back up into the esophagus and cause symptoms  or problems such as damage to the esophagus.  Treatment will depend on how bad your symptoms are.  Follow a diet as told by your doctor.  Take all medicines only as told by your doctor. This information is not intended to replace advice given to you by your health care provider. Make sure you discuss any questions you have with your health care provider. Document Released: 08/20/2007 Document Revised: 09/09/2017 Document Reviewed: 09/09/2017 Elsevier Interactive Patient Education  2019 [place] for Gastroesophageal Reflux Disease, Adult When you have gastroesophageal reflux disease (GERD), the foods you eat and your eating habits are very important. Choosing the right foods can help ease your discomfort. Think about working with a nutrition specialist (dietitian) to help you make good choices. What are tips for following this plan?  Meals  Choose healthy foods that are low in fat, such as fruits, vegetables, whole grains, low-fat dairy products, and lean meat, fish, and poultry.  Eat small meals often instead of 3 large meals a  day. Eat your meals slowly, and in a place where you are relaxed. Avoid bending over or lying down until 2-3 hours after eating.  Avoid eating meals 2-3 hours before bed.  Avoid drinking a lot of liquid with meals.  Cook foods using methods other than frying. Bake, grill, or broil food instead.  Avoid or limit: ? Chocolate. ? Peppermint or spearmint. ? Alcohol. ? Pepper. ? Black and decaffeinated coffee. ? Black and decaffeinated tea. ? Bubbly (carbonated) soft drinks. ? Caffeinated energy drinks and soft drinks.  Limit high-fat foods such as: ? Fatty meat or fried foods. ? Whole milk, cream, butter, or ice cream. ? Nuts and nut butters. ? Pastries, donuts, and sweets made with butter or shortening.  Avoid foods that cause symptoms. These foods may be different for everyone. Common foods that cause symptoms include: ? Tomatoes. ? Oranges, lemons, and limes. ? Peppers. ? Spicy food. ? Onions and garlic. ? Vinegar. Lifestyle  Maintain a healthy weight. Ask your doctor what weight is healthy for you. If you need to lose weight, work with your doctor to do so safely.  Exercise for at least 30 minutes for 5 or more days each week, or as told by your doctor.  Wear loose-fitting clothes.  Do not smoke. If you need help quitting, ask your doctor.  Sleep with the head of your bed higher than your feet. Use a wedge under the mattress or blocks under the bed frame to raise the head of the bed. Summary  When you have gastroesophageal reflux disease (GERD), food and lifestyle choices are very important in easing your symptoms.  Eat small meals often instead of 3 large meals a day. Eat your meals slowly, and in a place where you are relaxed.  Limit high-fat foods such as fatty meat or fried foods.  Avoid bending over or lying down until 2-3 hours after eating.  Avoid peppermint and spearmint, caffeine, alcohol, and chocolate. This information is not intended to replace advice  given to you by your health care provider. Make sure you discuss any questions you have with your health care provider. Document Released: 09/02/2011 Document Revised: 04/08/2016 Document Reviewed: 04/08/2016 Elsevier Interactive Patient Education  2019 [employer]   - Dr. Herma [facility]

## 2018-09-06 ENCOUNTER — Ambulatory Visit (INDEPENDENT_AMBULATORY_CARE_PROVIDER_SITE_OTHER): Payer: Medicare Other | Admitting: *Deleted

## 2018-09-06 DIAGNOSIS — I480 Paroxysmal atrial fibrillation: Secondary | ICD-10-CM

## 2018-09-06 DIAGNOSIS — I495 Sick sinus syndrome: Secondary | ICD-10-CM

## 2018-09-06 LAB — CUP PACEART REMOTE DEVICE CHECK
Battery Remaining Longevity: 50 mo
Battery Remaining Percentage: 40 %
Battery Voltage: 2.84 V
Brady Statistic AP VP Percent: 1 %
Brady Statistic AP VS Percent: 1 %
Brady Statistic AS VP Percent: 1 %
Brady Statistic AS VS Percent: 98 %
Brady Statistic RA Percent Paced: 1 %
Brady Statistic RV Percent Paced: 1 %
Date Time Interrogation Session: 20200622111003
Implantable Lead Implant Date: 19990914
Implantable Lead Implant Date: 19990914
Implantable Lead Location: 753859
Implantable Lead Location: 753860
Implantable Pulse Generator Implant Date: 20110601
Lead Channel Impedance Value: 330 Ohm
Lead Channel Impedance Value: 440 Ohm
Lead Channel Pacing Threshold Amplitude: 0.5 V
Lead Channel Pacing Threshold Amplitude: 1.75 V
Lead Channel Pacing Threshold Pulse Width: 0.4 ms
Lead Channel Pacing Threshold Pulse Width: 0.7 ms
Lead Channel Sensing Intrinsic Amplitude: 0.6 mV
Lead Channel Sensing Intrinsic Amplitude: 12 mV
Lead Channel Setting Pacing Amplitude: 2 V
Lead Channel Setting Pacing Amplitude: 2 V
Lead Channel Setting Pacing Pulse Width: 0.7 ms
Lead Channel Setting Sensing Sensitivity: 2 mV
Pulse Gen Model: 2210
Pulse Gen Serial Number: 7130714

## 2018-09-16 NOTE — Progress Notes (Signed)
Remote pacemaker transmission.   

## 2018-10-08 ENCOUNTER — Other Ambulatory Visit: Payer: Self-pay

## 2018-10-08 ENCOUNTER — Other Ambulatory Visit (HOSPITAL_COMMUNITY): Payer: Self-pay | Admitting: Cardiovascular Disease

## 2018-10-08 ENCOUNTER — Ambulatory Visit (HOSPITAL_COMMUNITY)
Admission: RE | Admit: 2018-10-08 | Discharge: 2018-10-08 | Disposition: A | Discharge status: Home / Self Care | Payer: Medicare Other | Source: Ambulatory Visit | Attending: Internal Medicine | Admitting: Internal Medicine

## 2018-10-08 DIAGNOSIS — I714 Abdominal aortic aneurysm, without rupture, unspecified: Secondary | ICD-10-CM

## 2018-11-08 ENCOUNTER — Ambulatory Visit (INDEPENDENT_AMBULATORY_CARE_PROVIDER_SITE_OTHER): Payer: Medicare Other | Admitting: Cardiovascular Disease

## 2018-11-08 ENCOUNTER — Other Ambulatory Visit: Payer: Self-pay

## 2018-11-08 VITALS — BP 140/78 | HR 58 | Temp 97.9°F | Ht 68.0 in | Wt 186.8 lb

## 2018-11-08 DIAGNOSIS — I739 Peripheral vascular disease, unspecified: Secondary | ICD-10-CM

## 2018-11-08 DIAGNOSIS — C154 Malignant neoplasm of middle third of esophagus: Secondary | ICD-10-CM

## 2018-11-08 DIAGNOSIS — I10 Essential (primary) hypertension: Secondary | ICD-10-CM | POA: Diagnosis not present

## 2018-11-08 DIAGNOSIS — Z952 Presence of prosthetic heart valve: Secondary | ICD-10-CM

## 2018-11-08 DIAGNOSIS — I480 Paroxysmal atrial fibrillation: Secondary | ICD-10-CM

## 2018-11-08 DIAGNOSIS — Z950 Presence of cardiac pacemaker: Secondary | ICD-10-CM

## 2018-11-08 DIAGNOSIS — E785 Hyperlipidemia, unspecified: Secondary | ICD-10-CM

## 2018-11-08 DIAGNOSIS — G4733 Obstructive sleep apnea (adult) (pediatric): Secondary | ICD-10-CM

## 2018-11-08 DIAGNOSIS — I2510 Atherosclerotic heart disease of native coronary artery without angina pectoris: Secondary | ICD-10-CM

## 2018-11-08 DIAGNOSIS — I714 Abdominal aortic aneurysm, without rupture, unspecified: Secondary | ICD-10-CM

## 2018-11-08 DIAGNOSIS — I495 Sick sinus syndrome: Secondary | ICD-10-CM | POA: Diagnosis not present

## 2018-11-08 LAB — COMPREHENSIVE METABOLIC PANEL
ALT: 13 IU/L (ref 0–44)
AST: 25 IU/L (ref 0–40)
Albumin/Globulin Ratio: 1.4 (ref 1.2–2.2)
Albumin: 3.5 g/dL — ABNORMAL LOW (ref 3.6–4.6)
Alkaline Phosphatase: 113 IU/L (ref 39–117)
BUN/Creatinine Ratio: 19 (ref 10–24)
BUN: 22 mg/dL (ref 8–27)
Bilirubin Total: 0.6 mg/dL (ref 0.0–1.2)
CO2: 21 mmol/L (ref 20–29)
Calcium: 8.6 mg/dL (ref 8.6–10.2)
Chloride: 109 mmol/L — ABNORMAL HIGH (ref 96–106)
Creatinine, Ser: 1.15 mg/dL (ref 0.76–1.27)
GFR calc Af Amer: 68 mL/min/{1.73_m2} (ref >59)
GFR calc non Af Amer: 59 mL/min/{1.73_m2} — ABNORMAL LOW (ref >59)
Globulin, Total: 2.5 g/dL (ref 1.5–4.5)
Glucose: 92 mg/dL (ref 65–99)
Potassium: 4 mmol/L (ref 3.5–5.2)
Sodium: 142 mmol/L (ref 134–144)
Total Protein: 6 g/dL (ref 6.0–8.5)

## 2018-11-08 LAB — LIPID PANEL
Chol/HDL Ratio: 2.6 ratio (ref 0.0–5.0)
Cholesterol, Total: 132 mg/dL (ref 100–199)
HDL: 50 mg/dL (ref >39)
LDL Calculated: 51 mg/dL (ref 0–99)
Triglycerides: 153 mg/dL — ABNORMAL HIGH (ref 0–149)
VLDL Cholesterol Cal: 31 mg/dL (ref 5–40)

## 2018-11-08 NOTE — Progress Notes (Signed)
Patient ID: [name]., male   DOB: 11-27-1934, 84 y.o.   MRN: [id number]    Cardiology Office Note    Date:  11/09/2018   ID:  [name]., DOB 05-13-1934, MRN [id number]  PCP:  [name], MD  Cardiologist:   Sanda Klein, MD   Chief Complaint  Patient presents with  . Coronary Artery Disease  . Pacemaker Check  . Hoarse    History of Present Illness:  [name]. is a 83 y.o. male with a long-standing history of cardiac problems.  He has been seeing Dr. [name] for his problems with sinus bradycardia and pacemaker management, following Dr. Lowella Fairy retirement.  He has occasional left-sided chest discomfort that is mild and related to eating.  It happen after he eats particularly with hamburger or steak, not with softer easier, chewable foods.  He has developed some hoarseness.  If he speaks for more than about 5 or 10 minutes he loses his voice.  This is happened just over the last 4 to 5 weeks.  He completed radiation therapy for esophageal cancer about 6 months ago.  He was previously evaluated by Dr. Izora Gala.  The patient specifically denies any chest pain at rest exertion, dyspnea at rest or with exertion, orthopnea, paroxysmal nocturnal dyspnea, syncope, palpitations, focal neurological deficits, intermittent claudication, lower extremity edema, unexplained weight gain, cough, hemoptysis or wheezing.  He has a history of aortic stenosis and underwent TAVR in 2017 Burt Knack, Edwards Sapien 3 29 mm).  Most recently evaluated by echo in June 2019 with normal prosthetic valve function.  He has a long history of coronary disease with acute MI and left circumflex angioplasty in 1994, bypass surgery in 1997, most recently rotational atherectomy and drug-eluting stent to the ostium of the right coronary artery October 02, 2017 (patent LIMA to LAD, SVG to diagonal, sequential SVG to ramus and OM 3, occluded SVG to RCA).  He has a small infrarenal abdominal aortic  aneurysm, but the most recent ultrasound, last checked in July 2020 was stable in size and 4.4 cm.  He also has known greater than 50% stenoses in the right common iliac and the left external iliac arteries, without complication.  He has a chronic left bundle branch block.  [name] presented with an acute myocardial infarction in 1994 and eventually underwent 5 vessel bypass in 1997. Repeat angiography in 2003 showed interval occlusion of the saphenous vein graft to the right coronary artery but the other bypasses were widely patent (LIMA-LAD, SVG diagonal, sequential SVG to ramus-distal circumflex). He had a normal nuclear perfusion study in 2014.  In June 2019, his nuclear stress test showed a fixed scar in the inferoseptal wall with a small adjacent area of inducible ischemia, EF 47%.  He underwent cardiac catheterization with failed initial attempt at revascularization, but then had successful rotational atherectomy with placement of a synergy 324 drug-eluting stent (Dr. Ellyn Hack).  He has an infrarenal abdominal aortic aneurysm but most recently measured 4.4 cm on duplex ultrasound February 15, 2018.  He has a history of right external iliac artery stenosis but with normal ABIs.  He has a dual-chamber St. Jude Accent RF permanent pacemaker implanted for symptomatic sinus bradycardia, followed by Dr. [name].  On his last pacemaker download from September 06, 2018 he has virtually no atrial or ventricular pacing.  Generator longevity is estimated at 3.6-4.8 years.  He has not had any episodes of ventricular tachycardia.  He had no evidence of atrial  fibrillation (he has not had atrial fibrillation since October 2019), but in the past he had several episodes of atrial fibrillation, the longest one lasting for 7 hours.  He is not on anticoagulation.  He has chronically mildly elevated pacing thresholds on the ventricular lead.  Additional problems include systemic hypertension and obstructive sleep apnea on  CPAP.  He reports compliance with CPAP and benefits from therapy.  Past Medical History:  Diagnosis Date  . AAA (abdominal aortic aneurysm) ([place])    a. Last duplex - 3.8cm 01/2013 - due 01/2014.  [name] Acne rosacea   . Alcoholic hepatitis with ascites 08/28/2017  . Anginal pain ([place]) 1996  . Aortic stenosis, mild    Last echo 05/04/12 +LVH  . Arthritis    "shoulders" (09/01/2017)  . Back pain    hx epidural injections  . Baker's cyst    Lt.  [name] CAD (coronary artery disease)    a. s/p MI/PTCA - Cx 1994. b. s/p CABG x5 in 1997. c. Abnl nuc 2003- occ SVG-RCA, patent seq SVG-OM1-dCxOM, patentl LIMA-LAD, patent, SVG-diag. d. Normal nuc 09/2012.  . Carotid artery disease ([place])    a. Duplex 01/2013: mildly abnormal. Mild plaque without significant diameter reduction. Repeat recommended 11/15.  [name] COPD (chronic obstructive pulmonary disease) ([place])   . Dizziness and giddiness 08/04/2013  . GERD (gastroesophageal reflux disease)   . H/O: GI bleed    mild, neg. colonoscopy  . Heart murmur   . HOH (hard of hearing)   . HTN (hypertension)   . Hyperlipidemia   . Hypertensive heart disease   . Myocardial infarction ([place]) 1994  . OSA on CPAP   . OSA treated with BiPAP 11/20/2015   Severe with [facility] 38/hr  . PAF (paroxysmal atrial fibrillation) ([place])    a. Remote per records.   . Pneumonia    history  . Presence of permanent cardiac pacemaker    DOI 1999 with change out 2006; St Jude  . Prostate cancer ([place]) ~ 2010   a. s/p radical retropubic prostatectomy with bilateral pelvic lymph node dissection  . PVD (peripheral vascular disease) ([place])    a. Rt ext iliac stenosis, last PV cath 2003, moderate stenosis last Lower Ext Dopplers 12/16/11 - Rt. ABI 0.96  Lt ABI 1.0.  . Sick sinus syndrome ([place])    a. placed 1999, gen change 2011 - St. Jude.  . Sinus node dysfunction ([place]) 01/20/2013  . Syncope 11/28/97  . Thrombocytopenia ([facility])    chronic, mild  . Valvular heart disease    a. Echo 04/2012: mild-mod  AS, mild AI, mild MR.    Past Surgical History:  Procedure Laterality Date  . APPENDECTOMY    . BIOPSY  10/20/2017   Procedure: BIOPSY;  Surgeon: Doran Stabler, MD;  Location: [facility];  Service: Gastroenterology;;  . CARDIAC CATHETERIZATION  2003   VG to RCA occluded, other grafts patent  . CARDIAC CATHETERIZATION N/A 05/04/2015   Procedure: Right/Left Heart Cath and Coronary/Graft Angiography;  Surgeon: Sherren Mocha, MD;  Location: [place] [facility];  Service: Cardiovascular;  Laterality: N/A;  . CARDIAC CATHETERIZATION  09/01/2017  . CARDIAC VALVE REPLACEMENT    . CATARACT EXTRACTION W/ INTRAOCULAR LENS  IMPLANT, BILATERAL Bilateral   . COLONOSCOPY    . CORONARY ANGIOPLASTY  03/29/92   PTCA to LCX  . CORONARY ARTERY BYPASS GRAFT  1997   LIMA-LAD; VG-diag; seq VG-1st OM & distal LCX; VG-RCA  . CORONARY ATHERECTOMY N/A 09/01/2017  Procedure: CORONARY ATHERECTOMY;  Surgeon: Jettie Booze, MD;  Location: [place] [facility];  Service: Cardiovascular;  Laterality: N/A;  . CORONARY ATHERECTOMY  10/01/2017  . CORONARY ATHERECTOMY N/A 10/01/2017   Procedure: CORONARY ATHERECTOMY;  Surgeon: Leonie Man, MD;  Location: [place] [facility];  Service: Cardiovascular;  Laterality: N/A;  . ESOPHAGOGASTRODUODENOSCOPY (EGD) WITH PROPOFOL N/A 10/20/2017   Procedure: ESOPHAGOGASTRODUODENOSCOPY (EGD) WITH PROPOFOL;  Surgeon: Doran Stabler, MD;  [facility];  Service: Gastroenterology;  Laterality: N/A;  . ESOPHAGOGASTRODUODENOSCOPY (EGD) WITH PROPOFOL N/A 03/29/2018   Procedure: ESOPHAGOGASTRODUODENOSCOPY (EGD) WITH PROPOFOL;  Surgeon: Doran Stabler, MD;  Location: [facility];  Service: Gastroenterology;  Laterality: N/A;  . INSERT / REPLACE / REMOVE PACEMAKER  11/28/1997   pacesetter--ERI 2011  . LEFT HEART CATH N/A 09/01/2017   Procedure: Left Heart Cath;  Surgeon: Jettie Booze, MD;  Location: [place] [facility];  Service: Cardiovascular;  Laterality:  N/A;  . LEFT HEART CATH AND CORS/GRAFTS ANGIOGRAPHY N/A 08/28/2017   Procedure: LEFT HEART CATH AND CORS/GRAFTS ANGIOGRAPHY;  Surgeon: Leonie Man, MD;  Location: [place] [facility];  Service: Cardiovascular;  Laterality: N/A;  . PACEMAKER GENERATOR CHANGE  08/15/2009   St. Jude accent  . SUPRAPUBIC PROSTATECTOMY    . TEE WITHOUT CARDIOVERSION N/A 06/12/2015   Procedure: TRANSESOPHAGEAL ECHOCARDIOGRAM (TEE);  Surgeon: Sherren Mocha, MD;  Location: [facility];  Service: Open Heart Surgery;  Laterality: N/A;  . TONSILLECTOMY    . TRANSCATHETER AORTIC VALVE REPLACEMENT, TRANSFEMORAL N/A 06/12/2015   Procedure: TRANSCATHETER AORTIC VALVE REPLACEMENT, TRANSFEMORAL, possible transpical;  Surgeon: Sherren Mocha, MD;  Location: [facility];  Service: Open Heart Surgery;  Laterality: N/A;    Outpatient Medications Prior to Visit  Medication Sig Dispense Refill  . acetaminophen (TYLENOL) 325 MG tablet Take 650 mg by mouth 2 (two) times daily.    [name] atorvastatin (LIPITOR) 40 MG tablet Take 1 tablet (40 mg total) by mouth daily at 6 PM. 30 tablet 0  . clobetasol cream (TEMOVATE) 0.05 %     . diclofenac sodium (VOLTAREN) 1 % GEL     . losartan (COZAAR) 25 MG tablet Take 1 tablet (25 mg total) by mouth daily. 90 tablet 3  . metoprolol tartrate (LOPRESSOR) 25 MG tablet TAKE 1 TABLET BY MOUTH TWICE DAILY 180 tablet 3  . nitroGLYCERIN (NITROSTAT) 0.4 MG SL tablet Place 1 tablet (0.4 mg total) under the tongue every 5 (five) minutes as needed for chest pain. 25 tablet 2  . Omega-3 Fatty Acids (FISH OIL) 1200 MG CAPS Take 1,200 mg by mouth every evening.    . pantoprazole (PROTONIX) 40 MG tablet Take 1 tablet (40 mg total) by mouth daily. 90 tablet 3  . vitamin C (ASCORBIC ACID) 500 MG tablet Take 500 mg by mouth every evening.    . famotidine (PEPCID) 20 MG tablet TAKE 1 TABLET BY MOUTH EVERY 12 HOURS FOR ACID REFULX     No facility-administered medications prior to visit.      Allergies:   Patient has no known  allergies.   Social History   Socioeconomic History  . Marital status: Widowed    Spouse name: Not on file  . Number of children: 5  . Years of education: 7th  . Highest education level: Not on file  Occupational History  . Occupation: Retired  Scientific laboratory technician  . Financial resource strain: Not on file  . Food insecurity    Worry: Not on file  Inability: Not on file  . Transportation needs    Medical: Not on file    Non-medical: Not on file  Tobacco Use  . Smoking status: Former Smoker    Packs/day: 2.00    Years: 48.00    Pack years: 96.00    Types: Cigarettes    Quit date: 03/18/1995    Years since quitting: 23.6  . Smokeless tobacco: Never Used  Substance and Sexual Activity  . Alcohol use: No    Comment: 09/01/2017 "nothing in the last couple years"  . Drug use: Never  . Sexual activity: Not Currently  Lifestyle  . Physical activity    Days per week: Not on file    Minutes per session: Not on file  . Stress: Not on file  Relationships  . Social Herbalist on phone: Not on file    Gets together: Not on file    Attends religious service: Not on file    Active member of club or organization: Not on file    Attends meetings of clubs or organizations: Not on file    Relationship status: Not on file  Other Topics Concern  . Not on file  Social History Narrative  . Not on file     Family History:  [name] family history includes CAD in an other family member; Cancer in his brother; Colon cancer in his father; Heart Problems in his brother and sister.   ROS:   Please see the history of present illness.    All other systems are reviewed and are negative.  PHYSICAL EXAM:   VS:  BP 140/78   Pulse (!) 58   Temp 97.9 F (36.6 C)   Ht 5\' 8"  (1.727 m)   Wt 186 lb 12.8 oz (84.7 kg)   SpO2 97%   BMI 28.40 kg/m      General: Alert, oriented x3, no distress, overweight.  Healthy pacemaker site. Head: no evidence of trauma, PERRL, EOMI, no  exophtalmos or lid lag, no myxedema, no xanthelasma; normal ears, nose and oropharynx Neck: normal jugular venous pulsations and no hepatojugular reflux; brisk carotid pulses without delay and no carotid bruits Chest: clear to auscultation, no signs of consolidation by percussion or palpation, normal fremitus, symmetrical and full respiratory excursions Cardiovascular: normal position and quality of the apical impulse, regular rhythm, normal first and second heart sounds, no murmurs, rubs or gallops Abdomen: no tenderness or distention, no masses by palpation, no abnormal pulsatility or arterial bruits, normal bowel sounds, no hepatosplenomegaly Extremities: no clubbing, cyanosis or edema; 2+ radial, ulnar and brachial pulses bilaterally; 2+ right femoral, posterior tibial and dorsalis pedis pulses; 2+ left femoral, posterior tibial and dorsalis pedis pulses; no subclavian or femoral bruits Neurological: grossly nonfocal Psych: Normal mood and affect    Wt Readings from Last 3 Encounters:  11/08/18 186 lb 12.8 oz (84.7 kg)  08/27/18 182 lb (82.6 kg)  08/19/18 188 lb 3.2 oz (85.4 kg)      Studies/Labs Reviewed:   EKG:  EKG is ordered today.  It shows sinus rhythm with a single PAC, left bundle branch block (QRS 156 ms), QTC 492 ms Recent Labs: 12/28/2017: TSH 1.970 08/19/2018: Hemoglobin 11.3; Platelet Count 61 11/08/2018: ALT 13; BUN 22; Creatinine, Ser 1.15; Potassium 4.0; Sodium 142   Lipid Panel    Component Value Date/Time   CHOL 132 11/08/2018 0910   TRIG 153 (H) 11/08/2018 0910   HDL 50 11/08/2018 0910   CHOLHDL 2.6  11/08/2018 0910   CHOLHDL 3.3 04/26/2015 1620   VLDL 41 (H) 04/26/2015 1620   LDLCALC 51 11/08/2018 0910    Additional studies/ records that were reviewed today include:  Vascular study July 2020, pacemaker download June 2020  ASSESSMENT:     1. S/P TAVR (transcatheter aortic valve replacement)   2. Coronary artery disease involving native coronary artery  of native heart without angina pectoris   3. SSS (sick sinus syndrome) (HCC)   4. Paroxysmal atrial fibrillation ([place])   5. Pacemaker   6. Essential hypertension   7. Hyperlipidemia, unspecified hyperlipidemia type   8. PAD (peripheral artery disease) ([place])   9. AAA (abdominal aortic aneurysm) without rupture ([place])   10. Malignant neoplasm of middle third of esophagus ([place])   11. OSA (obstructive sleep apnea)      PLAN:  In order of problems listed above:  1. AS s/p TAVR: Normal prosthesis (Edwards Sapien 3, 29 mm) by echo June 2019, mean gradient 13 mmHg 2. CAD s/p CABG: His chest discomfort sounds associated with eating and it most likely is a form of dysphagia.  Probably secondary to treatment for his esophageal cancer.  Symptoms are never exertional.  ECG is nondiagnostic due to left bundle branch block.  Nuclear stress test June 2019 was intermediate risk (fixed inferoseptal scar, adjacent small area of reversibility in the lateral wall concerning for small area of inducible ischemia, EF 47%) -some of the abnormalities on the stress test are likely explained by LBBB related artifact. 3. SSS s/p PPM: Very rarely requires pacing 4. PAFib: Prevalence of atrial fibrillation is very low.  He is not on anticoagulation.  He has not had any episodes of mode switch in the last year.   5. PM: Normally functioning dual-chamber permanent pacemaker, his device is followed by Dr. [name].  Rarely requires either atrial or ventricular pacing.  Chronic mild elevation in RV pacing threshold is not impeding normal device function.  No mode switch has been recorded since October 2019.  As he was in the office today we performed the device cyber security upgrade. 6. HTN: Well-controlled 7. HLP: Excellent recent lipid profile with LDL cholesterol well under 70. 8. PAD/AAA: right external iliac artery stenosis and moderate fusiform infrarenal abdominal aortic aneurysm.  AAA size stable over last 12 months.   Repeat in 1 year. 9. Esophageal cancer status post radiation and chemotherapy.  Followed by Dr. Benay Spice. 10. OSA: Reports compliance with CPAP.    Medication Adjustments/Labs and Tests Ordered: Current medicines are reviewed at length with the patient today.  Concerns regarding medicines are outlined above.  Medication changes, Labs and Tests ordered today are listed in the Patient Instructions below. Patient Instructions  Medication Instructions:  Your physician recommends that you continue on your current medications as directed. Please refer to the Current Medication list given to you today.  If you need a refill on your cardiac medications before your next appointment, please call your pharmacy.   Lab work: Your provider would like for you to have the following labs today: CMET and Lipid  If you have labs (blood work) drawn today and your tests are completely normal, you will receive your results only by: [place] (if you have MyChart) OR A paper copy in the mail If you have any lab test that is abnormal or we need to change your treatment, we will call you to review the results.  Testing/Procedures: None ordered  Follow-Up: At [facility], you and your health  needs are our priority.  As part of our continuing mission to provide you with exceptional heart care, we have created designated Provider Care Teams.  These Care Teams include your primary Cardiologist (physician) and Advanced Practice Providers (APPs -  Physician Assistants and Nurse Practitioners) who all work together to provide you with the care you need, when you need it. You will need a follow up appointment in 12 months.  Please call our office 2 months in advance to schedule this appointment.  You may see Sanda Klein, MD or one of the following Advanced Practice Providers on your designated Care Team: Almyra Deforest, PA-C Fabian Sharp, [place]             Signed, Sanda Klein, MD  11/09/2018 6:05 PM     [address], [place], [place]  [address] Phone: [phone]; Fax: [phone]

## 2018-11-08 NOTE — Patient Instructions (Signed)
Medication Instructions:  Your physician recommends that you continue on your current medications as directed. Please refer to the Current Medication list given to you today.  If you need a refill on your cardiac medications before your next appointment, please call your pharmacy.   Lab work: Your provider would like for you to have the following labs today: CMET and Lipid  If you have labs (blood work) drawn today and your tests are completely normal, you will receive your results only by: [place] (if you have MyChart) OR A paper copy in the mail If you have any lab test that is abnormal or we need to change your treatment, we will call you to review the results.  Testing/Procedures: None ordered  Follow-Up: At [facility], you and your health needs are our priority.  As part of our continuing mission to provide you with exceptional heart care, we have created designated Provider Care Teams.  These Care Teams include your primary Cardiologist (physician) and Advanced Practice Providers (APPs -  Physician Assistants and Nurse Practitioners) who all work together to provide you with the care you need, when you need it. You will need a follow up appointment in 12 months.  Please call our office 2 months in advance to schedule this appointment.  You may see Sanda Klein, MD or one of the following Advanced Practice Providers on your designated Care Team: Almyra Deforest, PA-C Fabian Sharp, [place]

## 2018-11-09 ENCOUNTER — Telehealth: Payer: Self-pay | Admitting: Cardiology

## 2018-11-09 ENCOUNTER — Encounter: Payer: Self-pay | Admitting: Cardiovascular Disease

## 2018-11-09 NOTE — Telephone Encounter (Signed)
Patient wants to move to [facility]

## 2018-11-09 NOTE — Telephone Encounter (Signed)
New Message     Pt is calling and says he needs a new order for his Cpap to be changed to [facility] from [facility]    Please call

## 2018-11-15 NOTE — Telephone Encounter (Signed)
[facility] notified that patient wants to change his dme. [employer] will pull all paperwork needed. Patient notified.

## 2018-11-16 ENCOUNTER — Other Ambulatory Visit: Payer: Self-pay | Admitting: Cardiovascular Disease

## 2018-11-19 ENCOUNTER — Other Ambulatory Visit: Payer: Self-pay

## 2018-11-19 ENCOUNTER — Inpatient Hospital Stay: Payer: Medicare Other | Attending: Oncology

## 2018-11-19 ENCOUNTER — Inpatient Hospital Stay (HOSPITAL_BASED_OUTPATIENT_CLINIC_OR_DEPARTMENT_OTHER): Payer: Medicare Other | Admitting: Oncology

## 2018-11-19 ENCOUNTER — Telehealth: Payer: Self-pay | Admitting: Nurse Practitioner

## 2018-11-19 VITALS — BP 128/66 | HR 69 | Temp 98.9°F | Resp 17 | Ht 68.0 in | Wt 185.9 lb

## 2018-11-19 DIAGNOSIS — Z8546 Personal history of malignant neoplasm of prostate: Secondary | ICD-10-CM | POA: Insufficient documentation

## 2018-11-19 DIAGNOSIS — I739 Peripheral vascular disease, unspecified: Secondary | ICD-10-CM | POA: Diagnosis not present

## 2018-11-19 DIAGNOSIS — Z950 Presence of cardiac pacemaker: Secondary | ICD-10-CM | POA: Insufficient documentation

## 2018-11-19 DIAGNOSIS — C154 Malignant neoplasm of middle third of esophagus: Secondary | ICD-10-CM | POA: Diagnosis not present

## 2018-11-19 DIAGNOSIS — J449 Chronic obstructive pulmonary disease, unspecified: Secondary | ICD-10-CM | POA: Diagnosis not present

## 2018-11-19 DIAGNOSIS — D638 Anemia in other chronic diseases classified elsewhere: Secondary | ICD-10-CM | POA: Diagnosis not present

## 2018-11-19 DIAGNOSIS — D500 Iron deficiency anemia secondary to blood loss (chronic): Secondary | ICD-10-CM | POA: Diagnosis not present

## 2018-11-19 DIAGNOSIS — I2510 Atherosclerotic heart disease of native coronary artery without angina pectoris: Secondary | ICD-10-CM | POA: Diagnosis not present

## 2018-11-19 DIAGNOSIS — R1310 Dysphagia, unspecified: Secondary | ICD-10-CM | POA: Insufficient documentation

## 2018-11-19 LAB — CBC WITH DIFFERENTIAL (CANCER CENTER ONLY)
Abs Immature Granulocytes: 0.01 10*3/uL (ref 0.00–0.07)
Basophils Absolute: 0 10*3/uL (ref 0.0–0.1)
Basophils Relative: 0 %
Eosinophils Absolute: 0 10*3/uL (ref 0.0–0.5)
Eosinophils Relative: 1 %
HCT: 27.4 % — ABNORMAL LOW (ref 39.0–52.0)
Hemoglobin: 9.1 g/dL — ABNORMAL LOW (ref 13.0–17.0)
Immature Granulocytes: 0 %
Lymphocytes Relative: 16 %
Lymphs Abs: 0.6 10*3/uL — ABNORMAL LOW (ref 0.7–4.0)
MCH: 33.3 pg (ref 26.0–34.0)
MCHC: 33.2 g/dL (ref 30.0–36.0)
MCV: 100.4 fL — ABNORMAL HIGH (ref 80.0–100.0)
Monocytes Absolute: 0.6 10*3/uL (ref 0.1–1.0)
Monocytes Relative: 16 %
Neutro Abs: 2.3 10*3/uL (ref 1.7–7.7)
Neutrophils Relative %: 67 %
Platelet Count: 58 10*3/uL — ABNORMAL LOW (ref 150–400)
RBC: 2.73 MIL/uL — ABNORMAL LOW (ref 4.22–5.81)
RDW: 13.4 % (ref 11.5–15.5)
WBC Count: 3.4 10*3/uL — ABNORMAL LOW (ref 4.0–10.5)
nRBC: 0 % (ref 0.0–0.2)

## 2018-11-19 NOTE — Progress Notes (Signed)
  [name]   Diagnosis: Esophagus cancer  INTERVAL HISTORY:   [name] returns as scheduled.  He reports "burning "when he drinks sodas.  He has dysphasia when he takes pills.  He is tolerating a regular diet. He reports intermittent dark stool.  No bright red blood.  He sees "flecks" of blood.  No dyspnea.  He complains of frequent nosebleeding requiring cauterization by ENT.  He saw Dr. Constance Holster yesterday and had a bleeding site cauterized at the left anterior septum.  Objective:  Vital signs in last 24 hours:  Blood pressure 128/66, pulse 69, temperature 98.9 F (37.2 C), temperature source Oral, resp. rate 17, height 5\' 8"  (1.727 m), weight 185 lb 14.4 oz (84.3 kg), SpO2 96 %.    HEENT: Ulcerated/dried blood at the left nasal septum Lymphatics: No cervical, supraclavicular, axillary, or inguinal nodes GI: No hepatosplenomegaly, no mass, nontender Vascular: No leg edema   Lab Results:  Lab Results  Component Value Date   WBC 3.4 (L) 11/19/2018   HGB 9.1 (L) 11/19/2018   HCT 27.4 (L) 11/19/2018   MCV 100.4 (H) 11/19/2018   PLT 58 (L) 11/19/2018   NEUTROABS 2.3 11/19/2018    CMP  Lab Results  Component Value Date   NA 142 11/08/2018   K 4.0 11/08/2018   CL 109 (H) 11/08/2018   CO2 21 11/08/2018   GLUCOSE 92 11/08/2018   BUN 22 11/08/2018   CREATININE 1.15 11/08/2018   CALCIUM 8.6 11/08/2018   PROT 6.0 11/08/2018   ALBUMIN 3.5 (L) 11/08/2018   AST 25 11/08/2018   ALT 13 11/08/2018   ALKPHOS 113 11/08/2018   BILITOT 0.6 11/08/2018   GFRNONAA 59 (L) 11/08/2018   GFRAA 68 11/08/2018     Medications: I have reviewed the patient's current medications.   Assessment/Plan: 1. 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 Staging CTs 10/26/2017-asymmetric esophageal thickening, no evidence of metastatic disease ? Endoscopic biopsy 10/20/2017-mid esophagus partially obstructing mass, biopsy confirmed  squamous cell carcinoma ? PET scan 11/02/2017-wall thickening in the mid esophagus SUV 27 MA: No findings specific for metastatic disease ? Radiation 11/09/2017-12/17/2017 ? Cycle 1 weekly Taxol/carboplatin 11/10/2017 ? Cycle 2 weekly Taxol/carboplatin 11/17/2017 (carboplatin held due to thrombocytopenia) ? Cycle 3 weekly Taxol/carboplatin 12/08/2017 (dosesreduced due to neutropenia, thrombocytopenia) ? Cycle 4 weekly Taxol/carboplatin 12/15/2017 ? Endoscopy 03/29/2018- no mass seen 2. Solid dysphagia and odynophagia secondary to #1-improved 3. Coronary artery disease 4. History of prostate cancer 5. COPD 6. Permanent cardiac pacemaker 7. Peripheral vascular disease 8. Chronic thrombocytopenia 9. 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 CT chest 12/28/2017-no evidence of pulmonary emboli. Esophageal thickening consistent with patient's history of esophageal carcinoma. 11. Probable cirrhosis, referred to GI 12. Anemia-progressive compared to when he was here in June, potentially related to GI bleeding or epistaxis.     Disposition: CurtisSims is in clinical remission from esophagus cancer.  He reports increased esophageal "burning "and dysphasia.  I will refer him to Dr. Loletha Carrow to consider a repeat upper endoscopy.  I suspect underlying cirrhosis accounts for the chronic thrombocytopenia.  The anemia is secondary to recent nosebleeding chronic disease, and possibly GI bleeding.  I encouraged him to follow-up with Dr. Loletha Carrow to consider the indication for upper and lower endoscopy.  He will return for a CBC in 1 month and an office visit in 2 months.  Betsy Coder, MD  11/19/2018  11:45 AM

## 2018-11-19 NOTE — Telephone Encounter (Signed)
Scheduled per 09/04 los, patient received after visit summary and calender.

## 2018-11-23 ENCOUNTER — Telehealth: Payer: Self-pay | Admitting: *Deleted

## 2018-11-23 DIAGNOSIS — G4733 Obstructive sleep apnea (adult) (pediatric): Secondary | ICD-10-CM

## 2018-11-23 NOTE — Telephone Encounter (Signed)
Order placed for supplies

## 2018-12-06 ENCOUNTER — Ambulatory Visit (INDEPENDENT_AMBULATORY_CARE_PROVIDER_SITE_OTHER): Payer: Medicare Other | Admitting: *Deleted

## 2018-12-06 DIAGNOSIS — I495 Sick sinus syndrome: Secondary | ICD-10-CM

## 2018-12-07 ENCOUNTER — Other Ambulatory Visit: Payer: Self-pay | Admitting: Cardiology

## 2018-12-07 LAB — CUP PACEART REMOTE DEVICE CHECK
Battery Remaining Longevity: 50 mo
Battery Remaining Percentage: 40 %
Battery Voltage: 2.84 V
Brady Statistic AP VP Percent: 1 %
Brady Statistic AP VS Percent: 1 %
Brady Statistic AS VP Percent: 1 %
Brady Statistic AS VS Percent: 97 %
Brady Statistic RA Percent Paced: 1 %
Brady Statistic RV Percent Paced: 1 %
Date Time Interrogation Session: 20200921131114
Implantable Lead Implant Date: 19990914
Implantable Lead Implant Date: 19990914
Implantable Lead Location: 753859
Implantable Lead Location: 753860
Implantable Pulse Generator Implant Date: 20110601
Lead Channel Impedance Value: 330 Ohm
Lead Channel Impedance Value: 450 Ohm
Lead Channel Pacing Threshold Amplitude: 0.5 V
Lead Channel Pacing Threshold Amplitude: 1.625 V
Lead Channel Pacing Threshold Pulse Width: 0.4 ms
Lead Channel Pacing Threshold Pulse Width: 0.7 ms
Lead Channel Sensing Intrinsic Amplitude: 0.5 mV
Lead Channel Sensing Intrinsic Amplitude: 12 mV
Lead Channel Setting Pacing Amplitude: 1.875
Lead Channel Setting Pacing Amplitude: 2 V
Lead Channel Setting Pacing Pulse Width: 0.7 ms
Lead Channel Setting Sensing Sensitivity: 2 mV
Pulse Gen Model: 2210
Pulse Gen Serial Number: 7130714

## 2018-12-08 ENCOUNTER — Telehealth: Payer: Self-pay | Admitting: Cardiovascular Disease

## 2018-12-08 NOTE — Telephone Encounter (Signed)
New Message   Patient states he's been getting nosebleeds and thinks his CPAP machine needs to be adjusted please call patient back to discuss.

## 2018-12-14 ENCOUNTER — Encounter: Payer: Self-pay | Admitting: Cardiology

## 2018-12-14 NOTE — Progress Notes (Signed)
Remote pacemaker transmission.   

## 2018-12-15 ENCOUNTER — Inpatient Hospital Stay (HOSPITAL_COMMUNITY)
Admission: EM | Admit: 2018-12-15 | Discharge: 2018-12-16 | DRG: 303 | Disposition: A | Discharge status: Home / Self Care | Payer: Medicare Other | Attending: Cardiovascular Disease | Admitting: Cardiovascular Disease

## 2018-12-15 ENCOUNTER — Emergency Department (HOSPITAL_COMMUNITY): Payer: Medicare Other

## 2018-12-15 ENCOUNTER — Other Ambulatory Visit: Payer: Self-pay

## 2018-12-15 DIAGNOSIS — Z951 Presence of aortocoronary bypass graft: Secondary | ICD-10-CM

## 2018-12-15 DIAGNOSIS — I440 Atrioventricular block, first degree: Secondary | ICD-10-CM | POA: Diagnosis present

## 2018-12-15 DIAGNOSIS — K219 Gastro-esophageal reflux disease without esophagitis: Secondary | ICD-10-CM | POA: Diagnosis present

## 2018-12-15 DIAGNOSIS — Z7902 Long term (current) use of antithrombotics/antiplatelets: Secondary | ICD-10-CM

## 2018-12-15 DIAGNOSIS — K7460 Unspecified cirrhosis of liver: Secondary | ICD-10-CM | POA: Diagnosis present

## 2018-12-15 DIAGNOSIS — I200 Unstable angina: Secondary | ICD-10-CM | POA: Diagnosis present

## 2018-12-15 DIAGNOSIS — I480 Paroxysmal atrial fibrillation: Secondary | ICD-10-CM | POA: Diagnosis present

## 2018-12-15 DIAGNOSIS — R490 Dysphonia: Secondary | ICD-10-CM | POA: Diagnosis present

## 2018-12-15 DIAGNOSIS — I252 Old myocardial infarction: Secondary | ICD-10-CM

## 2018-12-15 DIAGNOSIS — I25110 Atherosclerotic heart disease of native coronary artery with unstable angina pectoris: Principal | ICD-10-CM | POA: Diagnosis present

## 2018-12-15 DIAGNOSIS — Z955 Presence of coronary angioplasty implant and graft: Secondary | ICD-10-CM

## 2018-12-15 DIAGNOSIS — Z79899 Other long term (current) drug therapy: Secondary | ICD-10-CM

## 2018-12-15 DIAGNOSIS — E785 Hyperlipidemia, unspecified: Secondary | ICD-10-CM | POA: Diagnosis present

## 2018-12-15 DIAGNOSIS — H9190 Unspecified hearing loss, unspecified ear: Secondary | ICD-10-CM | POA: Diagnosis present

## 2018-12-15 DIAGNOSIS — X58XXXA Exposure to other specified factors, initial encounter: Secondary | ICD-10-CM | POA: Diagnosis present

## 2018-12-15 DIAGNOSIS — G4733 Obstructive sleep apnea (adult) (pediatric): Secondary | ICD-10-CM | POA: Diagnosis present

## 2018-12-15 DIAGNOSIS — J449 Chronic obstructive pulmonary disease, unspecified: Secondary | ICD-10-CM | POA: Diagnosis present

## 2018-12-15 DIAGNOSIS — M549 Dorsalgia, unspecified: Secondary | ICD-10-CM | POA: Diagnosis present

## 2018-12-15 DIAGNOSIS — D61818 Other pancytopenia: Secondary | ICD-10-CM | POA: Diagnosis present

## 2018-12-15 DIAGNOSIS — I088 Other rheumatic multiple valve diseases: Secondary | ICD-10-CM | POA: Diagnosis present

## 2018-12-15 DIAGNOSIS — Z8249 Family history of ischemic heart disease and other diseases of the circulatory system: Secondary | ICD-10-CM

## 2018-12-15 DIAGNOSIS — Z8501 Personal history of malignant neoplasm of esophagus: Secondary | ICD-10-CM

## 2018-12-15 DIAGNOSIS — I739 Peripheral vascular disease, unspecified: Secondary | ICD-10-CM | POA: Diagnosis present

## 2018-12-15 DIAGNOSIS — Z800 Family history of malignant neoplasm of digestive organs: Secondary | ICD-10-CM

## 2018-12-15 DIAGNOSIS — I495 Sick sinus syndrome: Secondary | ICD-10-CM | POA: Diagnosis present

## 2018-12-15 DIAGNOSIS — Z950 Presence of cardiac pacemaker: Secondary | ICD-10-CM

## 2018-12-15 DIAGNOSIS — Z87891 Personal history of nicotine dependence: Secondary | ICD-10-CM

## 2018-12-15 DIAGNOSIS — I714 Abdominal aortic aneurysm, without rupture: Secondary | ICD-10-CM | POA: Diagnosis present

## 2018-12-15 DIAGNOSIS — G8929 Other chronic pain: Secondary | ICD-10-CM | POA: Diagnosis present

## 2018-12-15 DIAGNOSIS — Z8546 Personal history of malignant neoplasm of prostate: Secondary | ICD-10-CM

## 2018-12-15 DIAGNOSIS — I447 Left bundle-branch block, unspecified: Secondary | ICD-10-CM | POA: Diagnosis present

## 2018-12-15 DIAGNOSIS — I119 Hypertensive heart disease without heart failure: Secondary | ICD-10-CM | POA: Diagnosis present

## 2018-12-15 DIAGNOSIS — Z923 Personal history of irradiation: Secondary | ICD-10-CM

## 2018-12-15 DIAGNOSIS — T451X5A Adverse effect of antineoplastic and immunosuppressive drugs, initial encounter: Secondary | ICD-10-CM | POA: Diagnosis present

## 2018-12-15 DIAGNOSIS — M19012 Primary osteoarthritis, left shoulder: Secondary | ICD-10-CM | POA: Diagnosis present

## 2018-12-15 DIAGNOSIS — Z952 Presence of prosthetic heart valve: Secondary | ICD-10-CM

## 2018-12-15 DIAGNOSIS — Z20828 Contact with and (suspected) exposure to other viral communicable diseases: Secondary | ICD-10-CM | POA: Diagnosis present

## 2018-12-15 LAB — BASIC METABOLIC PANEL
Anion gap: 9 (ref 5–15)
BUN: 19 mg/dL (ref 8–23)
CO2: 21 mmol/L — ABNORMAL LOW (ref 22–32)
Calcium: 8.8 mg/dL — ABNORMAL LOW (ref 8.9–10.3)
Chloride: 110 mmol/L (ref 98–111)
Creatinine, Ser: 1.08 mg/dL (ref 0.61–1.24)
GFR calc Af Amer: >60 mL/min (ref >60)
GFR calc non Af Amer: >60 mL/min (ref >60)
Glucose, Bld: 135 mg/dL — ABNORMAL HIGH (ref 70–99)
Potassium: 4.1 mmol/L (ref 3.5–5.1)
Sodium: 140 mmol/L (ref 135–145)

## 2018-12-15 LAB — CBC
HCT: 31.3 % — ABNORMAL LOW (ref 39.0–52.0)
Hemoglobin: 10.6 g/dL — ABNORMAL LOW (ref 13.0–17.0)
MCH: 33 pg (ref 26.0–34.0)
MCHC: 33.9 g/dL (ref 30.0–36.0)
MCV: 97.5 fL (ref 80.0–100.0)
Platelets: 79 10*3/uL — ABNORMAL LOW (ref 150–400)
RBC: 3.21 MIL/uL — ABNORMAL LOW (ref 4.22–5.81)
RDW: 13.1 % (ref 11.5–15.5)
WBC: 2.7 10*3/uL — ABNORMAL LOW (ref 4.0–10.5)
nRBC: 0 % (ref 0.0–0.2)

## 2018-12-15 LAB — TROPONIN I (HIGH SENSITIVITY)
Troponin I (High Sensitivity): 18 ng/L — ABNORMAL HIGH (ref <18)
Troponin I (High Sensitivity): 19 ng/L — ABNORMAL HIGH (ref <18)

## 2018-12-15 MED ORDER — NITROGLYCERIN 0.4 MG SL SUBL
0.4000 mg | SUBLINGUAL_TABLET | SUBLINGUAL | Status: DC | PRN
  Administered 2018-12-15: 0.4 mg via SUBLINGUAL
  Filled 2018-12-15: qty 1

## 2018-12-15 MED ORDER — ASPIRIN 81 MG PO CHEW
324.0000 mg | CHEWABLE_TABLET | Freq: Once | ORAL | Status: AC
  Administered 2018-12-15: 324 mg via ORAL
  Filled 2018-12-15: qty 4

## 2018-12-15 MED ORDER — ATORVASTATIN CALCIUM 40 MG PO TABS
40.0000 mg | ORAL_TABLET | Freq: Every day | ORAL | Status: DC
  Administered 2018-12-16: 40 mg via ORAL
  Filled 2018-12-15: qty 1

## 2018-12-15 MED ORDER — SODIUM CHLORIDE 0.9% FLUSH
3.0000 mL | Freq: Once | INTRAVENOUS | Status: AC
  Administered 2018-12-15: 3 mL via INTRAVENOUS

## 2018-12-15 MED ORDER — METOPROLOL TARTRATE 25 MG PO TABS
25.0000 mg | ORAL_TABLET | Freq: Two times a day (BID) | ORAL | Status: DC
  Administered 2018-12-16 (×2): 25 mg via ORAL
  Filled 2018-12-15 (×2): qty 1

## 2018-12-15 MED ORDER — LOSARTAN POTASSIUM 25 MG PO TABS
25.0000 mg | ORAL_TABLET | Freq: Every day | ORAL | Status: DC
  Administered 2018-12-16 (×2): 25 mg via ORAL
  Filled 2018-12-15 (×2): qty 1

## 2018-12-15 NOTE — H&P (Signed)
Cardiology Admission History and Physical:   Patient ID: [name].; MRN: [id number]; DOB: 1934-12-20   Admission date: 12/15/2018  Primary Care Provider:  [name], MD Primary Cardiologist:  [name], MD / Sanda Klein, MD    Chief Complaint: Intermittent left-sided chest pain  History of Present Illness:   Curtis Broad. is a 84 y.o. male with a history of aortic stenosis status post TAVR in 2017 Anthony Sar Sapien 3 29 mm), CABG in 1997 (per cath in 2019 - patent LIMA to LAD, SVG to diagonal, sequential SVG to ramus and OM 3, occluded SVG to RCA), rotational atherectomy and DES to the ostium of the RCA July 2019, small infrarenal abdominal aortic aneurysm (4.4 cm), peripheral arterial disease, paroxysmal atrial fibrillation (not on anticoagulation), symptomatic sinus bradycardia s/p dual-chamber St. Jude Accent RF permanent pacemaker implant, obstructive sleep apnea on CPAP, left shoulder arthritis, chronic back pain, GERD, hypertension, hyperlipidemia, thrombocytopenia, prostate cancer, and esophageal cancer status post radiation therapy, who presents to the hospital with complaints of intermittent chest pain for the past 2 weeks.  The patient states that he has been having worsening chest pain off and on for the past few days.  It is left-sided and feels pressure-like.  At times he has taken sublingual nitroglycerin with limited relief.  His last nitroglycerin use was 2 days ago.  Today at 4 AM he woke up from sleep with chest pain.  This was more intense than usual prompting him to come to the hospital.   The patient denies any orthopnea, paroxysmal nocturnal dyspnea or lower leg swelling.  He does have exertional dyspnea when walking out to his mailbox.  This has been fairly stable over the past few months.  He also complains of left shoulder pain that is more constant in nature.  The patient believes that this is more due to his chronic arthritis.  He denies  any palpitations, presyncope or syncope.  He states compliance with all of his medications.  However he is confused about his antiplatelet regimen.  In the ED his heart rate was in the 60s to 80s.  His initial blood pressure was mildly elevated at 156/76 mmHg.  The high-sensitivity troponins were 18 and 19.  The ECG revealed sinus rhythm with a first-degree AV block.  There was also a chronic left bundle branch block.  The chest x-ray revealed that the lungs were hyperinflated bilaterally without any evidence of infiltrates or effusion.  He received 324 mg of aspirin and 1 sublingual nitroglycerin dose with improvement in his chest pain.    Past Medical History:  Diagnosis Date  . AAA (abdominal aortic aneurysm) ([place])    a. Last duplex - 3.8cm 01/2013 - due 01/2014.  [name] Acne rosacea   . Alcoholic hepatitis with ascites 08/28/2017  . Anginal pain ([place]) 1996  . Aortic stenosis, mild    Last echo 05/04/12 +LVH  . Arthritis    "shoulders" (09/01/2017)  . Back pain    hx epidural injections  . Baker's cyst    Lt.  [name] CAD (coronary artery disease)    a. s/p MI/PTCA - Cx 1994. b. s/p CABG x5 in 1997. c. Abnl nuc 2003- occ SVG-RCA, patent seq SVG-OM1-dCxOM, patentl LIMA-LAD, patent, SVG-diag. d. Normal nuc 09/2012.  . Carotid artery disease ([place])    a. Duplex 01/2013: mildly abnormal. Mild plaque without significant diameter reduction. Repeat recommended 11/15.  [name] COPD (chronic obstructive pulmonary disease) ([place])   . Dizziness and giddiness  08/04/2013  . GERD (gastroesophageal reflux disease)   . H/O: GI bleed    mild, neg. colonoscopy  . Heart murmur   . HOH (hard of hearing)   . HTN (hypertension)   . Hyperlipidemia   . Hypertensive heart disease   . Myocardial infarction ([place]) 1994  . OSA on CPAP   . OSA treated with BiPAP 11/20/2015   Severe with [facility] 38/hr  . PAF (paroxysmal atrial fibrillation) ([place])    a. Remote per records.   . Pneumonia    history  . Presence of permanent cardiac  pacemaker    DOI 1999 with change out 2006; St Jude  . Prostate cancer ([place]) ~ 2010   a. s/p radical retropubic prostatectomy with bilateral pelvic lymph node dissection  . PVD (peripheral vascular disease) ([place])    a. Rt ext iliac stenosis, last PV cath 2003, moderate stenosis last Lower Ext Dopplers 12/16/11 - Rt. ABI 0.96  Lt ABI 1.0.  . Sick sinus syndrome ([place])    a. placed 1999, gen change 2011 - St. Jude.  . Sinus node dysfunction ([place]) 01/20/2013  . Syncope 11/28/97  . Thrombocytopenia ([facility])    chronic, mild  . Valvular heart disease    a. Echo 04/2012: mild-mod AS, mild AI, mild MR.    Past Surgical History:  Procedure Laterality Date  . APPENDECTOMY    . BIOPSY  10/20/2017   Procedure: BIOPSY;  Surgeon: Doran Stabler, MD;  Location: [facility];  Service: Gastroenterology;;  . CARDIAC CATHETERIZATION  2003   VG to RCA occluded, other grafts patent  . CARDIAC CATHETERIZATION N/A 05/04/2015   Procedure: Right/Left Heart Cath and Coronary/Graft Angiography;  Surgeon: Sherren Mocha, MD;  Location: [place] [facility];  Service: Cardiovascular;  Laterality: N/A;  . CARDIAC CATHETERIZATION  09/01/2017  . CARDIAC VALVE REPLACEMENT    . CATARACT EXTRACTION W/ INTRAOCULAR LENS  IMPLANT, BILATERAL Bilateral   . COLONOSCOPY    . CORONARY ANGIOPLASTY  03/29/92   PTCA to LCX  . CORONARY ARTERY BYPASS GRAFT  1997   LIMA-LAD; VG-diag; seq VG-1st OM & distal LCX; VG-RCA  . CORONARY ATHERECTOMY N/A 09/01/2017   Procedure: CORONARY ATHERECTOMY;  Surgeon: Jettie Booze, MD;  Location: [place] [facility];  Service: Cardiovascular;  Laterality: N/A;  . CORONARY ATHERECTOMY  10/01/2017  . CORONARY ATHERECTOMY N/A 10/01/2017   Procedure: CORONARY ATHERECTOMY;  Surgeon: Leonie Man, MD;  Location: [place] [facility];  Service: Cardiovascular;  Laterality: N/A;  . ESOPHAGOGASTRODUODENOSCOPY (EGD) WITH PROPOFOL N/A 10/20/2017   Procedure: ESOPHAGOGASTRODUODENOSCOPY (EGD) WITH PROPOFOL;   Surgeon: Doran Stabler, MD;  [facility];  Service: Gastroenterology;  Laterality: N/A;  . ESOPHAGOGASTRODUODENOSCOPY (EGD) WITH PROPOFOL N/A 03/29/2018   Procedure: ESOPHAGOGASTRODUODENOSCOPY (EGD) WITH PROPOFOL;  Surgeon: Doran Stabler, MD;  [facility];  Service: Gastroenterology;  Laterality: N/A;  . INSERT / REPLACE / REMOVE PACEMAKER  11/28/1997   pacesetter--ERI 2011  . LEFT HEART CATH N/A 09/01/2017   Procedure: Left Heart Cath;  Surgeon: Jettie Booze, MD;  Location: [place] [facility];  Service: Cardiovascular;  Laterality: N/A;  . LEFT HEART CATH AND CORS/GRAFTS ANGIOGRAPHY N/A 08/28/2017   Procedure: LEFT HEART CATH AND CORS/GRAFTS ANGIOGRAPHY;  Surgeon: Leonie Man, MD;  Location: [place] [facility];  Service: Cardiovascular;  Laterality: N/A;  . PACEMAKER GENERATOR CHANGE  08/15/2009   St. Jude accent  . SUPRAPUBIC PROSTATECTOMY    . TEE WITHOUT CARDIOVERSION  N/A 06/12/2015   Procedure: TRANSESOPHAGEAL ECHOCARDIOGRAM (TEE);  Surgeon: Sherren Mocha, MD;  Location: [place];  Service: Open Heart Surgery;  Laterality: N/A;  . TONSILLECTOMY    . TRANSCATHETER AORTIC VALVE REPLACEMENT, TRANSFEMORAL N/A 06/12/2015   Procedure: TRANSCATHETER AORTIC VALVE REPLACEMENT, TRANSFEMORAL, possible transpical;  Surgeon: Sherren Mocha, MD;  Location: [facility];  Service: Open Heart Surgery;  Laterality: N/A;     Medications Prior to Admission: Prior to Admission medications   Medication Sig Start Date End Date Taking? Authorizing Provider  acetaminophen (TYLENOL) 325 MG tablet Take 650 mg by mouth 2 (two) times daily.   Yes [provider]  atorvastatin (LIPITOR) 40 MG tablet Take 1 tablet (40 mg total) by mouth daily at 6 PM. 08/28/17  Yes Kayleen Memos, DO  clopidogrel (PLAVIX) 75 MG tablet Take 75 mg by mouth daily. 12/07/18  Yes [provider]  losartan (COZAAR) 25 MG tablet TAKE 1 TABLET BY MOUTH DAILY Patient taking differently: Take 25  mg by mouth at bedtime.  12/08/18  Yes Daune Perch, NP  metoprolol tartrate (LOPRESSOR) 25 MG tablet TAKE 1 TABLET BY MOUTH TWICE DAILY Patient taking differently: Take 25 mg by mouth 2 (two) times daily.  05/05/18  Yes Baldwin Jamaica, PA-C  nitroGLYCERIN (NITROSTAT) 0.4 MG SL tablet Place 1 tablet (0.4 mg total) under the tongue every 5 (five) minutes as needed for chest pain. 10/02/17  Yes Lyda Jester M, PA-C  NON FORMULARY Apply 1 application topically See admin instructions. Tridema pain cream: Rub into both shoulders 2 times a day   Yes [provider]  Omega-3 Fatty Acids (FISH OIL) 1200 MG CAPS Take 1,200 mg by mouth every evening.   Yes [provider]  pantoprazole (PROTONIX) 40 MG tablet Take 1 tablet (40 mg total) by mouth daily. 04/05/18 04/05/19 Yes Turner, Eber Hong, MD     Allergies:   No Known Allergies  Social History:   Social History   Socioeconomic History  . Marital status: Widowed    Spouse name: Not on file  . Number of children: 5  . Years of education: 7th  . Highest education level: Not on file  Occupational History  . Occupation: Retired  Scientific laboratory technician  . Financial resource strain: Not on file  . Food insecurity    Worry: Not on file    Inability: Not on file  . Transportation needs    Medical: Not on file    Non-medical: Not on file  Tobacco Use  . Smoking status: Former Smoker    Packs/day: 2.00    Years: 48.00    Pack years: 96.00    Types: Cigarettes    Quit date: 03/18/1995    Years since quitting: 23.7  . Smokeless tobacco: Never Used  Substance and Sexual Activity  . Alcohol use: No    Comment: 09/01/2017 "nothing in the last couple years"  . Drug use: Never  . Sexual activity: Not Currently  Lifestyle  . Physical activity    Days per week: Not on file    Minutes per session: Not on file  . Stress: Not on file  Relationships  . Social Herbalist on phone: Not on file    Gets together: Not on file     Attends religious service: Not on file    Active member of club or organization: Not on file    Attends meetings of clubs or organizations: Not on file    Relationship  status: Not on file  . Intimate partner violence    Fear of current or ex partner: No    Emotionally abused: No    Physically abused: No    Forced sexual activity: No  Other Topics Concern  . Not on file  Social History Narrative  . Not on file     Family History:   [name] family history includes CAD in an other family member; Cancer in his brother; Colon cancer in his father; Heart Problems in his brother and sister. There is no history of Stomach cancer, Pancreatic cancer, or Esophageal cancer.     Review of Systems: [y] = yes, [ ]  = no   . General: Weight gain [ ] ; Weight loss [ ] ; Anorexia [ ] ; Fatigue [ ] ; Fever [ ] ; Chills [ ] ; Weakness [ ]   . Cardiac: Chest pain/pressure [Y ]; Resting SOB [ ] ; Exertional SOB [Y ]; Orthopnea [ ] ; Pedal Edema [ ] ; Palpitations [ ] ; Syncope [ ] ; Presyncope [ ] ; Paroxysmal nocturnal dyspnea[ ]   . Pulmonary: Cough [ ] ; Wheezing[ ] ; Hemoptysis[ ] ; Sputum [ ] ; Snoring [ ]   . GI: Vomiting[ ] ; Dysphagia[ ] ; Melena[ ] ; Hematochezia [ ] ; Heartburn[ ] ; Abdominal pain [ ] ; Constipation [ ] ; Diarrhea [ ] ; BRBPR [ ]   . GU: Hematuria[ ] ; Dysuria [ ] ; Nocturia[ ]   . Vascular: Pain in legs with walking [ ] ; Pain in feet with lying flat [ ] ; Non-healing sores [ ] ; Stroke [ ] ; TIA [ ] ; Slurred speech [ ] ;  . Neuro: Headaches[ ] ; Vertigo[ ] ; Seizures[ ] ; Paresthesias[ ] ;Blurred vision [ ] ; Diplopia [ ] ; Vision changes [ ]   . Ortho/Skin: Arthritis [ ] ; Joint pain [ ] ; Muscle pain [ ] ; Joint swelling [ ] ; Back Pain [ ] ; Rash [ ]   . Psych: Depression[ ] ; Anxiety[ ]   . Heme: Bleeding problems [ ] ; Clotting disorders [ ] ; Anemia [ ]   . Endocrine: Diabetes [ ] ; Thyroid dysfunction[ ]      Physical Exam/Data:   Vitals:   12/15/18 1743 12/15/18 1913 12/15/18 1930 12/15/18 1945  BP: (!) 154/69   (!) 143/83 (!) 162/70  Pulse: 68  63 69  Resp: 16  20 (!) 21  Temp: 98.6 F (37 C)     TempSrc: Oral     SpO2: 97%  97% 98%  Weight:  84.3 kg    Height:  5\' 8"  (1.727 m)     No intake or output data in the 24 hours ending 12/15/18 2143 Filed Weights   12/15/18 1913  Weight: 84.3 kg   Body mass index is 28.26 kg/m.  General:  Well nourished, well developed, in no acute distress HEENT: normal Lymph: no adenopathy Neck: no JVD Endocrine:  No thryomegaly Vascular: No carotid bruits; FA pulses 2+ bilaterally without bruits  Cardiac:  normal S1, S2; RRR; no murmur Lungs:  clear to auscultation bilaterally, no wheezing, rhonchi or rales  Abd: soft, nontender, no hepatomegaly  Ext: no edema Musculoskeletal:  No deformities, BUE and BLE strength normal and equal Skin: warm and dry  Neuro:  CNs 2-12 intact, no focal abnormalities noted Psych:  Normal affect    Laboratory Data:  Chemistry Recent Labs  Lab 12/15/18 1547  NA 140  K 4.1  CL 110  CO2 21*  GLUCOSE 135*  BUN 19  CREATININE 1.08  CALCIUM 8.8*  GFRNONAA >60  GFRAA >60  ANIONGAP 9    No results for input(s): PROT, ALBUMIN, AST, ALT,  ALKPHOS, BILITOT in the last 168 hours. Hematology Recent Labs  Lab 12/15/18 1547  WBC 2.7*  RBC 3.21*  HGB 10.6*  HCT 31.3*  MCV 97.5  MCH 33.0  MCHC 33.9  RDW 13.1  PLT 79*   Cardiac EnzymesNo results for input(s): TROPONINI in the last 168 hours. No results for input(s): TROPIPOC in the last 168 hours.  BNPNo results for input(s): BNP, PROBNP in the last 168 hours.  DDimer No results for input(s): DDIMER in the last 168 hours.  Radiology/Studies:  Dg Chest 2 View  Result Date: 12/15/2018 CLINICAL DATA:  Shortness of breath and chest pain EXAM: CHEST - 2 VIEW COMPARISON:  08/25/2017 FINDINGS: Cardiac shadows within normal limits. Postsurgical changes are again seen. Pacing device is again noted. Lungs are hyperinflated bilaterally. No focal infiltrate or sizable  effusion is seen. Prior exam prior TAVR are noted as well. Degenerative changes of the thoracic spine are seen. IMPRESSION: COPD without acute abnormality. Electronically Signed   By: Inez Catalina M.D.   On: 12/15/2018 16:30    Assessment and Plan:   1. Unstable angina The patient reports worsening chest pain episodes over the past 2 weeks.  He has used nitroglycerin infrequently with some improvement in his symptoms.  He also has pain in his left shoulder that appears to be nonexertional and more arthritic in nature.  In the hospital his ECG reveals a chronic left bundle branch block.  The high-sensitivity troponins are negative x2.  -Admit to telemetry -Trend cardiac biomarkers -Serial ECGs -Aspirin 81 mg daily -We will hold off on IV heparin for now -Continue atorvastatin 40 mg daily -Check a lipid panel -Continue antihypertensives (metoprolol and losartan) -Transthoracic echocardiogram   2.  Previous TAVR (in 2017) The echocardiogram in June 2019 revealed a normally functioning aortic bioprosthesis.  The gradients across the valve were normal.   3.  Mildly reduced LV systolic function (EF [id number] on last echo) Patient denies any heart failure symptoms and is euvolemic on clinical exam    Severity of Illness: The appropriate patient status for this patient is INPATIENT. Inpatient status is judged to be reasonable and necessary in order to provide the required intensity of service to ensure the patient's safety. [name] presenting symptoms, physical exam findings, and initial radiographic and laboratory data in the context of their chronic comorbidities is felt to place them at high risk for further clinical deterioration. Furthermore, it is not anticipated that the patient will be medically stable for discharge from the hospital within 2 midnights of admission. The following factors support the patient status of inpatient.   " [name] presenting symptoms include chest  pain. " The physical exam findings include normal cardiac auscultation. " The initial radiographic and laboratory data are worrisome because of LBBB on ECG. " The chronic co-morbidities include hypertension and hyperlipidemia.   * I certify that at the point of admission it is my clinical judgment that the patient will require inpatient hospital care spanning beyond 2 midnights from the point of admission due to high intensity of service, high risk for further deterioration and high frequency of surveillance required.*    For questions or updates, please contact [place] Please consult [email or web address] for contact info under Cardiology/STEMI.    Signed, Meade Maw, MD  12/15/2018 9:43 PM

## 2018-12-15 NOTE — ED Notes (Signed)
Gave pt a [place] sandwich and apple juice.  Pt is in no distress at this time

## 2018-12-15 NOTE — ED Triage Notes (Signed)
Pt here for constant, aching L sided chest pain with radiation to L neck and arm x 2 weeks. Hx of cabg Denies shob. Pain worse when lying down at night and with exertion.

## 2018-12-15 NOTE — ED Provider Notes (Signed)
[facility] EMERGENCY DEPARTMENT Provider Note   CSN: [id number] Arrival date & time: 12/15/18  1525     History   Chief Complaint Chief Complaint  Patient presents with  . Chest Pain    HPI Draelyn Service. is a 84 y.o. male.     The history is provided by the patient and medical records. No language interpreter was used.    [address] [place]. is a 84 y.o. male  with a significant cardiac hx including prior MI who presents to the Emergency Department complaining of intermittent, left-sided chest pain described as an aching pressure.  Radiates to his left arm and left neck.  Has been intermittent over the last 2 weeks, but progressively worsening.  It woke him up at 4 AM today and at that point, felt similar to his previous blockages.  He did take a nitro a couple of days ago which did provide relief.  Did not take any today.  Has not had aspirin today either.  Past Medical History:  Diagnosis Date  . AAA (abdominal aortic aneurysm) ([place])    a. Last duplex - 3.8cm 01/2013 - due 01/2014.  [name] Acne rosacea   . Alcoholic hepatitis with ascites 08/28/2017  . Anginal pain ([place]) 1996  . Aortic stenosis, mild    Last echo 05/04/12 +LVH  . Arthritis    "shoulders" (09/01/2017)  . Back pain    hx epidural injections  . Baker's cyst    Lt.  [name] CAD (coronary artery disease)    a. s/p MI/PTCA - Cx 1994. b. s/p CABG x5 in 1997. c. Abnl nuc 2003- occ SVG-RCA, patent seq SVG-OM1-dCxOM, patentl LIMA-LAD, patent, SVG-diag. d. Normal nuc 09/2012.  . Carotid artery disease ([place])    a. Duplex 01/2013: mildly abnormal. Mild plaque without significant diameter reduction. Repeat recommended 11/15.  [name] COPD (chronic obstructive pulmonary disease) ([place])   . Dizziness and giddiness 08/04/2013  . GERD (gastroesophageal reflux disease)   . H/O: GI bleed    mild, neg. colonoscopy  . Heart murmur   . HOH (hard of hearing)   . HTN (hypertension)   . Hyperlipidemia   . Hypertensive  heart disease   . Myocardial infarction ([place]) 1994  . OSA on CPAP   . OSA treated with BiPAP 11/20/2015   Severe with [facility] 38/hr  . PAF (paroxysmal atrial fibrillation) ([place])    a. Remote per records.   . Pneumonia    history  . Presence of permanent cardiac pacemaker    DOI 1999 with change out 2006; St Jude  . Prostate cancer ([place]) ~ 2010   a. s/p radical retropubic prostatectomy with bilateral pelvic lymph node dissection  . PVD (peripheral vascular disease) ([place])    a. Rt ext iliac stenosis, last PV cath 2003, moderate stenosis last Lower Ext Dopplers 12/16/11 - Rt. ABI 0.96  Lt ABI 1.0.  . Sick sinus syndrome ([place])    a. placed 1999, gen change 2011 - [employer] Jude.  . Sinus node dysfunction ([place]) 01/20/2013  . Syncope 11/28/97  . Thrombocytopenia (HCC)    chronic, mild  . Valvular heart disease    a. Echo 04/2012: mild-mod AS, mild AI, mild MR.    Patient Active Problem List   Diagnosis Date Noted  . Unstable angina ([place])   . 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 Pacemaker 02/19/2018  . Coronary artery disease involving coronary bypass graft of native heart without angina pectoris 02/19/2018  .  Anemia 01/19/2018  . Hypercholesterolemia 12/28/2017  . Encounter for antineoplastic chemotherapy 12/08/2017  . 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 Melena   . Heme positive stool   . Acute post-hemorrhagic anemia   . Esophageal mass   . Angina, class III ([place]) 10/01/2017  . Chest pain with moderate risk for cardiac etiology 09/01/2017  . Chest pain, rule out acute myocardial infarction 08/26/2017  . Essential hypertension 05/14/2016  . OSA (obstructive sleep apnea) 11/20/2015  . Obesity (BMI 30-39.9) 11/20/2015  . S/P TAVR (transcatheter aortic valve replacement) 06/12/2015  . Severe aortic stenosis 06/05/2015  . AAA (abdominal aortic aneurysm) without rupture ([place]) 04/27/2015  . PAD (peripheral artery disease) ([place]) 04/27/2015  . Dizziness and  giddiness 08/04/2013  . Dizziness 07/19/2013  . SSS (sick sinus syndrome) ([place]) 01/20/2013  . Hypertensive cardiovascular disease 01/20/2013  . Paroxysmal atrial fibrillation (HCC)   . CAD, multiple vessel   . Pacemaker -[employer]   . Aortic stenosis 05/07/2012    Class: Diagnosis of  . Syncope 11/28/1997    Past Surgical History:  Procedure Laterality Date  . APPENDECTOMY    . BIOPSY  10/20/2017   Procedure: BIOPSY;  Surgeon: Doran Stabler, MD;  Location: [facility];  Service: Gastroenterology;;  . CARDIAC CATHETERIZATION  2003   VG to RCA occluded, other grafts patent  . CARDIAC CATHETERIZATION N/A 05/04/2015   Procedure: Right/Left Heart Cath and Coronary/Graft Angiography;  Surgeon: Sherren Mocha, MD;  Location: [facility] CV LAB;  Service: Cardiovascular;  Laterality: N/A;  . CARDIAC CATHETERIZATION  09/01/2017  . CARDIAC VALVE REPLACEMENT    . CATARACT EXTRACTION W/ INTRAOCULAR LENS  IMPLANT, BILATERAL Bilateral   . COLONOSCOPY    . CORONARY ANGIOPLASTY  03/29/92   PTCA to LCX  . CORONARY ARTERY BYPASS GRAFT  1997   LIMA-LAD; VG-diag; seq VG-1st OM & distal LCX; VG-RCA  . CORONARY ATHERECTOMY N/A 09/01/2017   Procedure: CORONARY ATHERECTOMY;  Surgeon: Jettie Booze, MD;  Location: [facility] CV LAB;  Service: Cardiovascular;  Laterality: N/A;  . CORONARY ATHERECTOMY  10/01/2017  . CORONARY ATHERECTOMY N/A 10/01/2017   Procedure: CORONARY ATHERECTOMY;  Surgeon: Leonie Man, MD;  Location: [facility] CV LAB;  Service: Cardiovascular;  Laterality: N/A;  . ESOPHAGOGASTRODUODENOSCOPY (EGD) WITH PROPOFOL N/A 10/20/2017   Procedure: ESOPHAGOGASTRODUODENOSCOPY (EGD) WITH PROPOFOL;  Surgeon: Doran Stabler, MD;  [facility]: [facility];  Service: Gastroenterology;  Laterality: N/A;  . ESOPHAGOGASTRODUODENOSCOPY (EGD) WITH PROPOFOL N/A 03/29/2018   Procedure: ESOPHAGOGASTRODUODENOSCOPY (EGD) WITH PROPOFOL;  Surgeon: Doran Stabler, MD;  Location: [facility];   Service: Gastroenterology;  Laterality: N/A;  . INSERT / REPLACE / REMOVE PACEMAKER  11/28/1997   pacesetter--ERI 2011  . LEFT HEART CATH N/A 09/01/2017   Procedure: Left Heart Cath;  Surgeon: Jettie Booze, MD;  Location: [facility] CV LAB;  Service: Cardiovascular;  Laterality: N/A;  . LEFT HEART CATH AND CORS/GRAFTS ANGIOGRAPHY N/A 08/28/2017   Procedure: LEFT HEART CATH AND CORS/GRAFTS ANGIOGRAPHY;  Surgeon: Leonie Man, MD;  Location: [facility] CV LAB;  Service: Cardiovascular;  Laterality: N/A;  . PACEMAKER GENERATOR CHANGE  08/15/2009   St. Jude accent  . SUPRAPUBIC PROSTATECTOMY    . TEE WITHOUT CARDIOVERSION N/A 06/12/2015   Procedure: TRANSESOPHAGEAL ECHOCARDIOGRAM (TEE);  Surgeon: Sherren Mocha, MD;  Location: [facility];  Service: Open Heart Surgery;  Laterality: N/A;  . TONSILLECTOMY    . TRANSCATHETER AORTIC VALVE REPLACEMENT, TRANSFEMORAL N/A  06/12/2015   Procedure: TRANSCATHETER AORTIC VALVE REPLACEMENT, TRANSFEMORAL, possible transpical;  Surgeon: Sherren Mocha, MD;  Location: [facility];  Service: Open Heart Surgery;  Laterality: N/A;        Home Medications    Prior to Admission medications   Medication Sig Start Date End Date Taking? Authorizing Provider  acetaminophen (TYLENOL) 325 MG tablet Take 650 mg by mouth 2 (two) times daily.   Yes [provider]  atorvastatin (LIPITOR) 40 MG tablet Take 1 tablet (40 mg total) by mouth daily at 6 PM. 08/28/17  Yes Kayleen Memos, DO  clopidogrel (PLAVIX) 75 MG tablet Take 75 mg by mouth daily. 12/07/18  Yes [provider]  losartan (COZAAR) 25 MG tablet TAKE 1 TABLET BY MOUTH DAILY Patient taking differently: Take 25 mg by mouth at bedtime.  12/08/18  Yes Daune Perch, NP  metoprolol tartrate (LOPRESSOR) 25 MG tablet TAKE 1 TABLET BY MOUTH TWICE DAILY Patient taking differently: Take 25 mg by mouth 2 (two) times daily.  05/05/18  Yes Baldwin Jamaica, PA-C  nitroGLYCERIN (NITROSTAT) 0.4 MG SL tablet  Place 1 tablet (0.4 mg total) under the tongue every 5 (five) minutes as needed for chest pain. 10/02/17  Yes Lyda Jester M, PA-C  NON FORMULARY Apply 1 application topically See admin instructions. Tridema pain cream: Rub into both shoulders 2 times a day   Yes [provider]  Omega-3 Fatty Acids (FISH OIL) 1200 MG CAPS Take 1,200 mg by mouth every evening.   Yes [provider]  pantoprazole (PROTONIX) 40 MG tablet Take 1 tablet (40 mg total) by mouth daily. 04/05/18 04/05/19 Yes Sueanne Margarita, MD    Family History Family History  Problem Relation Age of Onset  . Colon cancer Father   . Cancer Brother   . Heart Problems Brother   . Heart Problems Sister   . CAD Other   . Stomach cancer Neg Hx   . Pancreatic cancer Neg Hx   . Esophageal cancer Neg Hx     Social History Social History   Tobacco Use  . Smoking status: Former Smoker    Packs/day: 2.00    Years: 48.00    Pack years: 96.00    Types: Cigarettes    Quit date: 03/18/1995    Years since quitting: 23.7  . Smokeless tobacco: Never Used  Substance Use Topics  . Alcohol use: No    Comment: 09/01/2017 "nothing in the last couple years"  . Drug use: Never     Allergies   Patient has no known allergies.   Review of Systems Review of Systems  Constitutional: Negative for fever.  Respiratory: Negative for cough and shortness of breath.   Cardiovascular: Positive for chest pain.  Gastrointestinal: Negative for abdominal pain.  Neurological: Negative for weakness and numbness.  All other systems reviewed and are negative.    Physical Exam Updated Vital Signs BP (!) 147/81   Pulse 73   Temp 98.6 F (37 C) (Oral)   Resp 19   Ht 5\' 8"  (1.727 m)   Wt 84.3 kg   SpO2 96%   BMI 28.26 kg/m   Physical Exam Vitals signs and nursing note reviewed.  Constitutional:      General: He is not in acute distress.    Appearance: He is well-developed.  HENT:     Head: Normocephalic and  atraumatic.  Neck:     Musculoskeletal: Neck supple.  Cardiovascular:     Rate and Rhythm:  Normal rate and regular rhythm.     Heart sounds: Normal heart sounds.     Comments: Intact and equal pulses x4. Pulmonary:     Effort: Pulmonary effort is normal. No respiratory distress.     Breath sounds: Normal breath sounds.  Abdominal:     General: There is no distension.     Palpations: Abdomen is soft.     Tenderness: There is no abdominal tenderness.  Skin:    General: Skin is warm and dry.  Neurological:     Mental Status: He is alert and oriented to person, place, and time.      ED Treatments / Results  Labs (all labs ordered are listed, but only abnormal results are displayed) Labs Reviewed  BASIC METABOLIC PANEL - Abnormal; Notable for the following components:      Result Value   CO2 21 (*)    Glucose, Bld 135 (*)    Calcium 8.8 (*)    All other components within normal limits  CBC - Abnormal; Notable for the following components:   WBC 2.7 (*)    RBC 3.21 (*)    Hemoglobin 10.6 (*)    HCT 31.3 (*)    Platelets 79 (*)    All other components within normal limits  TROPONIN I (HIGH SENSITIVITY) - Abnormal; Notable for the following components:   Troponin I (High Sensitivity) 18 (*)    All other components within normal limits  TROPONIN I (HIGH SENSITIVITY) - Abnormal; Notable for the following components:   Troponin I (High Sensitivity) 19 (*)    All other components within normal limits  SARS CORONAVIRUS 2 (TAT 6-24 HRS)    EKG EKG Interpretation  Date/Time:  Wednesday December 15 2018 15:30:36 EDT Ventricular Rate:  82 PR Interval:  244 QRS Duration: 156 QT Interval:  436 QTC Calculation: 509 R Axis:   85 Text Interpretation:  Sinus rhythm with 1st degree A-V block Left bundle branch block Abnormal ECG Confirmed by Carmin Muskrat [phone]) on 12/15/2018 7:37:21 PM   Radiology Dg Chest 2 View  Result Date: 12/15/2018 CLINICAL DATA:  Shortness of breath  and chest pain EXAM: CHEST - 2 VIEW COMPARISON:  08/25/2017 FINDINGS: Cardiac shadows within normal limits. Postsurgical changes are again seen. Pacing device is again noted. Lungs are hyperinflated bilaterally. No focal infiltrate or sizable effusion is seen. Prior exam prior TAVR are noted as well. Degenerative changes of the thoracic spine are seen. IMPRESSION: COPD without acute abnormality. Electronically Signed   By: Inez Catalina M.D.   On: 12/15/2018 16:30    Procedures Procedures (including critical care time)  Medications Ordered in ED Medications  nitroGLYCERIN (NITROSTAT) SL tablet 0.4 mg (0.4 mg Sublingual Given 12/15/18 1952)  atorvastatin (LIPITOR) tablet 40 mg (has no administration in time range)  losartan (COZAAR) tablet 25 mg (has no administration in time range)  metoprolol tartrate (LOPRESSOR) tablet 25 mg (has no administration in time range)  sodium chloride flush (NS) 0.9 % injection 3 mL (3 mLs Intravenous Given 12/15/18 1912)  aspirin chewable tablet 324 mg (324 mg Oral Given 12/15/18 1951)     Initial Impression / Assessment and Plan / ED Course  I have reviewed the triage vital signs and the nursing notes.  Pertinent labs & imaging results that were available during my care of the patient were reviewed by me and considered in my medical decision making (see chart for details).       [employer]. is a 84  y.o. male who presents to ED for left-sided chest pain which feels similar to his previous cardiac pain.  Did improve a little bit with nitro given.  EKG without acute ischemic changes.  First troponin of 18.  Second troponin of 19.  Discussed with cardiology who will admit.  Patient seen by and discussed with Dr. Vanita Panda who agrees with treatment plan.    Final Clinical Impressions(s) / ED Diagnoses   Final diagnoses:  Unstable angina [facility])    ED Discharge Orders    None       Sylvan Sookdeo, Ozella Almond, PA-C 12/15/18 2225    Carmin Muskrat, MD  12/15/18 2328

## 2018-12-15 NOTE — Telephone Encounter (Signed)
This is Dr Landis Gandy patient.

## 2018-12-16 ENCOUNTER — Inpatient Hospital Stay (HOSPITAL_COMMUNITY): Payer: Medicare Other

## 2018-12-16 DIAGNOSIS — I25700 Atherosclerosis of coronary artery bypass graft(s), unspecified, with unstable angina pectoris: Secondary | ICD-10-CM

## 2018-12-16 DIAGNOSIS — G8929 Other chronic pain: Secondary | ICD-10-CM | POA: Diagnosis present

## 2018-12-16 DIAGNOSIS — I440 Atrioventricular block, first degree: Secondary | ICD-10-CM | POA: Diagnosis present

## 2018-12-16 DIAGNOSIS — I340 Nonrheumatic mitral (valve) insufficiency: Secondary | ICD-10-CM

## 2018-12-16 DIAGNOSIS — Z952 Presence of prosthetic heart valve: Secondary | ICD-10-CM | POA: Diagnosis not present

## 2018-12-16 DIAGNOSIS — Z8501 Personal history of malignant neoplasm of esophagus: Secondary | ICD-10-CM | POA: Diagnosis not present

## 2018-12-16 DIAGNOSIS — K219 Gastro-esophageal reflux disease without esophagitis: Secondary | ICD-10-CM | POA: Diagnosis present

## 2018-12-16 DIAGNOSIS — X58XXXA Exposure to other specified factors, initial encounter: Secondary | ICD-10-CM | POA: Diagnosis present

## 2018-12-16 DIAGNOSIS — I714 Abdominal aortic aneurysm, without rupture: Secondary | ICD-10-CM

## 2018-12-16 DIAGNOSIS — E785 Hyperlipidemia, unspecified: Secondary | ICD-10-CM | POA: Diagnosis present

## 2018-12-16 DIAGNOSIS — I2510 Atherosclerotic heart disease of native coronary artery without angina pectoris: Secondary | ICD-10-CM

## 2018-12-16 DIAGNOSIS — I447 Left bundle-branch block, unspecified: Secondary | ICD-10-CM | POA: Diagnosis present

## 2018-12-16 DIAGNOSIS — R079 Chest pain, unspecified: Secondary | ICD-10-CM | POA: Diagnosis not present

## 2018-12-16 DIAGNOSIS — I200 Unstable angina: Secondary | ICD-10-CM | POA: Diagnosis present

## 2018-12-16 DIAGNOSIS — Z8546 Personal history of malignant neoplasm of prostate: Secondary | ICD-10-CM | POA: Diagnosis not present

## 2018-12-16 DIAGNOSIS — I495 Sick sinus syndrome: Secondary | ICD-10-CM

## 2018-12-16 DIAGNOSIS — Z950 Presence of cardiac pacemaker: Secondary | ICD-10-CM | POA: Diagnosis not present

## 2018-12-16 DIAGNOSIS — I088 Other rheumatic multiple valve diseases: Secondary | ICD-10-CM | POA: Diagnosis present

## 2018-12-16 DIAGNOSIS — I480 Paroxysmal atrial fibrillation: Secondary | ICD-10-CM

## 2018-12-16 DIAGNOSIS — M549 Dorsalgia, unspecified: Secondary | ICD-10-CM | POA: Diagnosis present

## 2018-12-16 DIAGNOSIS — K7460 Unspecified cirrhosis of liver: Secondary | ICD-10-CM | POA: Diagnosis present

## 2018-12-16 DIAGNOSIS — I25110 Atherosclerotic heart disease of native coronary artery with unstable angina pectoris: Secondary | ICD-10-CM | POA: Diagnosis present

## 2018-12-16 DIAGNOSIS — Z955 Presence of coronary angioplasty implant and graft: Secondary | ICD-10-CM | POA: Diagnosis not present

## 2018-12-16 DIAGNOSIS — Z951 Presence of aortocoronary bypass graft: Secondary | ICD-10-CM | POA: Diagnosis not present

## 2018-12-16 DIAGNOSIS — D61818 Other pancytopenia: Secondary | ICD-10-CM

## 2018-12-16 DIAGNOSIS — G4733 Obstructive sleep apnea (adult) (pediatric): Secondary | ICD-10-CM | POA: Diagnosis present

## 2018-12-16 DIAGNOSIS — Z923 Personal history of irradiation: Secondary | ICD-10-CM | POA: Diagnosis not present

## 2018-12-16 DIAGNOSIS — I739 Peripheral vascular disease, unspecified: Secondary | ICD-10-CM | POA: Diagnosis present

## 2018-12-16 DIAGNOSIS — C159 Malignant neoplasm of esophagus, unspecified: Secondary | ICD-10-CM

## 2018-12-16 DIAGNOSIS — I361 Nonrheumatic tricuspid (valve) insufficiency: Secondary | ICD-10-CM

## 2018-12-16 DIAGNOSIS — M19012 Primary osteoarthritis, left shoulder: Secondary | ICD-10-CM | POA: Diagnosis present

## 2018-12-16 DIAGNOSIS — Z20828 Contact with and (suspected) exposure to other viral communicable diseases: Secondary | ICD-10-CM | POA: Diagnosis present

## 2018-12-16 LAB — SARS CORONAVIRUS 2 (TAT 6-24 HRS): SARS Coronavirus 2: NEGATIVE

## 2018-12-16 LAB — TROPONIN I (HIGH SENSITIVITY): Troponin I (High Sensitivity): 25 ng/L — ABNORMAL HIGH (ref <18)

## 2018-12-16 LAB — LIPID PANEL
Cholesterol: 134 mg/dL (ref 0–200)
HDL: 47 mg/dL (ref >40)
LDL Cholesterol: 64 mg/dL (ref 0–99)
Total CHOL/HDL Ratio: 2.9 RATIO
Triglycerides: 117 mg/dL (ref <150)
VLDL: 23 mg/dL (ref 0–40)

## 2018-12-16 LAB — NM MYOCAR MULTI W/SPECT W/WALL MOTION / EF
Estimated workload: 1 METS
Exercise duration (min): 5 min
MPHR: 138 {beats}/min
Peak HR: 85 {beats}/min
Percent HR: 61 %
Rest HR: 71 {beats}/min

## 2018-12-16 LAB — ECHOCARDIOGRAM COMPLETE
Height: 68 in
Weight: 2973.56 oz

## 2018-12-16 LAB — BRAIN NATRIURETIC PEPTIDE: B Natriuretic Peptide: 276.4 pg/mL — ABNORMAL HIGH (ref 0.0–100.0)

## 2018-12-16 MED ORDER — TECHNETIUM TC 99M TETROFOSMIN IV KIT
10.0000 | PACK | Freq: Once | INTRAVENOUS | Status: AC | PRN
  Administered 2018-12-16: 11:00:00 10 via INTRAVENOUS

## 2018-12-16 MED ORDER — NITROGLYCERIN 0.4 MG SL SUBL: 0.4000 mg | SUBLINGUAL_TABLET | SUBLINGUAL | Status: DC | PRN

## 2018-12-16 MED ORDER — ASPIRIN EC 81 MG PO TBEC: 81.0000 mg | DELAYED_RELEASE_TABLET | Freq: Every day | ORAL | Status: DC

## 2018-12-16 MED ORDER — ENOXAPARIN SODIUM 40 MG/0.4ML ~~LOC~~ SOLN
40.0000 mg | SUBCUTANEOUS | Status: DC
  Filled 2018-12-16: qty 0.4

## 2018-12-16 MED ORDER — TECHNETIUM TC 99M TETROFOSMIN IV KIT
30.0000 | PACK | Freq: Once | INTRAVENOUS | Status: AC | PRN
  Administered 2018-12-16: 30 via INTRAVENOUS

## 2018-12-16 MED ORDER — ONDANSETRON HCL 4 MG/2ML IJ SOLN: 4.0000 mg | Freq: Four times a day (QID) | INTRAMUSCULAR | Status: DC | PRN

## 2018-12-16 MED ORDER — ACETAMINOPHEN 325 MG PO TABS
650.0000 mg | ORAL_TABLET | ORAL | Status: DC | PRN
  Administered 2018-12-16 (×2): 650 mg via ORAL
  Filled 2018-12-16 (×2): qty 2

## 2018-12-16 MED ORDER — REGADENOSON 0.4 MG/5ML IV SOLN
0.4000 mg | Freq: Once | INTRAVENOUS | Status: AC
  Administered 2018-12-16: 12:00:00 0.4 mg via INTRAVENOUS

## 2018-12-16 MED ORDER — REGADENOSON 0.4 MG/5ML IV SOLN
INTRAVENOUS | Status: AC
  Filled 2018-12-16: qty 5

## 2018-12-16 MED ORDER — ASPIRIN 81 MG PO TBEC: 81.0000 mg | DELAYED_RELEASE_TABLET | Freq: Every day | ORAL | Status: DC

## 2018-12-16 NOTE — Progress Notes (Signed)
   [employer]. presented for a lexiscan cardiolite today.  No immediate complications.  Stress imaging is pending at this time.  Reino Bellis, NP 12/16/2018, 12:35 PM

## 2018-12-16 NOTE — ED Notes (Signed)
Attempted report 

## 2018-12-16 NOTE — Progress Notes (Signed)
Echocardiogram 2D Echocardiogram has been performed.  Oneal Deputy Braxston Quinter 12/16/2018, 9:20 AM

## 2018-12-16 NOTE — Progress Notes (Signed)
No reversible ischemia and perfusion pattern and EF unchanged since January. Recommend evaluation for a GI cause of his symptoms, will discuss with his GI specialist. No plan for additional inpatient CV evaluation at this time.  Sanda Klein, MD, [facility] [phone] office [phone] pager

## 2018-12-16 NOTE — Telephone Encounter (Signed)
Please refer to ENT and in the mean time use nasal saline spray twice daily

## 2018-12-16 NOTE — ED Notes (Signed)
Pt reports HA, tylenol given.  Pt has eaten dinner (before midnight) and states that he became pain free after nitro but feels 5/10 pressure which he describes as "mild" at this time.

## 2018-12-16 NOTE — Discharge Summary (Signed)
Discharge Summary    Patient ID: [name].,  MRN: [id number], DOB/AGE: 10/08/1934 84 y.o.  Admit date: 12/15/2018 Discharge date: 12/16/2018  Primary Care Provider: [name] Primary Cardiologist: Curtis Axe, MD  Discharge Diagnoses    Active Problems:   Unstable angina [facility])   Allergies No Known Allergies  Diagnostic Studies/Procedures    TTE: 12/16/18  IMPRESSIONS    1. Left ventricular ejection fraction, by visual estimation, is 45 to 50%. The left ventricle has mildly decreased function. Mildly increased left ventricular size. Left ventricular septal wall thickness was normal. There is no left ventricular  hypertrophy.  2. Right ventricular volume/pressure overload.  Sims. Septal and apical hypokinesis with abnormal septal motion from pacing.  4. Global right ventricle has normal systolic function.The right ventricular size is normal. No increase in right ventricular wall thickness.  5. Left atrial size was normal.  6. Right atrial size was normal.  7. Moderate calcification of the mitral valve leaflet(s).  8. Moderate thickening of the mitral valve leaflet(s).  9. The mitral valve is degenerative. Mild mitral valve regurgitation. 10. The tricuspid valve is normal in structure. Tricuspid valve regurgitation is mild. 11. Aortic valve regurgitation is trivial by color flow Doppler. 12. Post TAVR 29 mm Sapien Sims gradients lower than those seen Juney 2019 with trivial PVL. 13. The pulmonic valve was grossly normal. Pulmonic valve regurgitation is mild by color flow Doppler. 14. Moderately elevated pulmonary artery systolic pressure.  Lexiscan: 12/16/18  FINDINGS: Perfusion: Large region of decreased activity in the mid basilar segment of the inferior wall and inferior septal wall which are fixed from stress to rest consistent with infarction. Findings similar to comparison exam. No evidence reversible ischemia.  Wall Motion: Septal hypokinesia  and dyskinesia.  Left Ventricular Ejection Fraction: 45 %  End diastolic volume 0000000 ml  End systolic volume 69 ml  IMPRESSION: 1. No reversible ischemia. Infarction in the mid and basilar segments of the septal wall and inferior septal wall. Findings similar to comparison perfusion exam 03/03/2018.  2. Septal hypokinesia and dyskinesia.  Sims. Left ventricular ejection fraction 45%  4. Non invasive risk stratification*: Intermediate (stratification weighted by low ejection fraction)  *2012 Appropriate Use Criteria for Coronary Revascularization Focused Update: J Am Coll Cardiol. [id number]. http://content.[email or web address].aspx?articleid=1201161  Electronically Signed   By: [name] M.D.   On: 12/16/2018 15:47 _____________   History of Present Illness     [name]. is a 84 y.o. male with a history of aortic stenosis status post TAVR in 2017 ([name],[name] 29 mm), CABG in 1997 (per cath in 2019 - patent LIMA to LAD, SVG to diagonal, sequential SVG to ramus and OM Sims, occluded SVG to RCA), rotational atherectomy and DES to the ostium of the RCA July 2019, small infrarenal abdominal aortic aneurysm (4.4 cm), peripheral arterial disease, paroxysmal atrial fibrillation (not on anticoagulation), symptomatic sinus bradycardia s/p dual-chamber [name] permanent pacemaker implant, obstructive sleep apnea on CPAP, left shoulder arthritis, chronic back pain, GERD, hypertension, hyperlipidemia, thrombocytopenia, prostate cancer, and esophageal cancer status post radiation therapy, who presented to the hospital with complaints of intermittent chest pain for the past 2 weeks.  The patient stated that he had been having worsening chest pain off and on for the past few days.  It was left-sided and felt pressure-like.  At times he had taken sublingual nitroglycerin with limited relief.  His last nitroglycerin use was 2 days prior to admission.  That morning at 4 AM he woke up from sleep with chest pain.  This was more intense than usual prompting him to come to the hospital.   The patient denied any orthopnea, paroxysmal nocturnal dyspnea or lower leg swelling.  He did have exertional dyspnea when walking out to his mailbox.  This had been fairly stable over the past few months.  He also complained of left shoulder pain that was more constant in nature. The patient believes that this was more due to his chronic arthritis.  He denied any palpitations, presyncope or syncope.  He stated compliance with all of his medications.  In the ED his heart rate was in the 60s to 80s.  His initial blood pressure was mildly elevated at 156/76 mmHg.  The high-sensitivity troponins were 18 and 19.  The ECG revealed sinus rhythm with a first-degree AV block.  There was also a chronic left bundle branch block.  The chest x-ray revealed that the lungs were hyperinflated bilaterally without any evidence of infiltrates or effusion.  He received 324 mg of aspirin and 1 sublingual nitroglycerin dose with improvement in his chest pain. He was admitted for further work up.   Hospital Course     1. Chest pain/CAD s/p CABG:Felt to be difficult to determine whether his current chest pain syndrome represented angina or if it was related to his esophageal disease. Minimal elevation in flat pattern of cardiac troponin suggested low risk for acute coronary syndrome.Remained in target range for possible in-stent restenosis following the ostial right coronary artery stent performed in 2019. Underwentlexiscan Myoview that showed no reversible ischemia with defects similar to that of prior stress test on 02/2018. Called intermediate based on EF of 45%, but this is known from prior echo.  -- He will remain on ASA statin, and beta blocker. Symptoms felt to be related to GI etiology. Given no ischemia on his lexiscan, we will stop his plavix in anticipation for endoscopy with his  GI MD with in 2 weeks.  2. ASs/p TAVR:Normal prosthesis([name], 29 mm)by echo June 2019 and echo this admission.  Sims. SSS s/p PPM: Normal device function by last remote download December 06, 2018.  He actually requires pacing very infrequently.  4. PAFib: His overall burden of atrial fibrillation had been extremely low, well under 1% and no episodes had been recorded recently. He is off anticoagulation since he received his stent and needed to take clopidogrel.  We will not resume anticoagulants until we stop his clopidogrel (if we detect atrial fibrillation again on his device)  5. [id number] functioning dual-chamber permanent pacemaker,his device is followed by Dr. [name].  6. [id number]  7. [id number] recent lipid profile with LDL 64 on labs.  8. PAD/AAA: right external iliac artery stenosis andmoderatefusiform infrarenal abdominal aortic aneurysm. Most recent ultrasound performed in October 08, 2018 showed stable aneurysm size at 4.4 cm.  On beta-blocker therapy.    9. Esophageal cancer status post radiation and chemotherapy. He has a follow-up appointment with Dr. Loletha Carrow in less than 2 weeks. Consider outpatient referral to ENT due to his complaints of hoarseness.  Possibilities include recurrent laryngeal nerve injury from radiation, GERD or progression of neoplasm.  10. [id number] compliance with CPAP.   11. Mild pancytopenia: going on for > 1 year. Presumed due to chemotherapy and probably cirrhosis.  Saw Dr. Benay Spice earlier this month. Some improvement since last December, stopping plavix at discharge.  [employer]. was seen by Dr. Sallyanne Kuster and determined stable  for discharge home. Follow up in the office has been arranged. Medications are listed below.   _____________  Discharge Vitals Blood pressure (!) 149/80, pulse 66, temperature 98.7 F (37.1 C), temperature source Oral, resp. rate 16, height 5\' 7"  (1.702 m), weight 80.4 kg, SpO2 98  %.  Filed Weights   12/15/18 1913 12/16/18 1500  Weight: 84.Sims kg 80.4 kg    Labs & Radiologic Studies    CBC Recent Labs    12/15/18 1547  WBC 2.7*  HGB 10.6*  HCT 31.Sims*  MCV 97.5  PLT 79*   Basic Metabolic Panel Recent Labs    12/15/18 1547  NA 140  K 4.1  CL 110  CO2 21*  GLUCOSE 135*  BUN 19  CREATININE 1.08  CALCIUM 8.8*   Liver Function Tests No results for input(s): AST, ALT, ALKPHOS, BILITOT, PROT, ALBUMIN in the last 72 hours. No results for input(s): LIPASE, AMYLASE in the last 72 hours. Cardiac Enzymes No results for input(s): CKTOTAL, CKMB, CKMBINDEX, TROPONINI in the last 72 hours. BNP Invalid input(s): POCBNP D-Dimer No results for input(s): DDIMER in the last 72 hours. Hemoglobin A1C No results for input(s): HGBA1C in the last 72 hours. Fasting Lipid Panel Recent Labs    12/16/18 0234  CHOL 134  HDL 47  LDLCALC 64  TRIG 117  CHOLHDL 2.9   Thyroid Function Tests No results for input(s): TSH, T4TOTAL, T3FREE, THYROIDAB in the last 72 hours.  Invalid input(s): FREET3 _____________  Dg Chest 2 View  Result Date: 12/15/2018 CLINICAL DATA:  Shortness of breath and chest pain EXAM: CHEST - 2 VIEW COMPARISON:  08/25/2017 FINDINGS: Cardiac shadows within normal limits. Postsurgical changes are again seen. Pacing device is again noted. Lungs are hyperinflated bilaterally. No focal infiltrate or sizable effusion is seen. Prior exam prior TAVR are noted as well. Degenerative changes of the thoracic spine are seen. IMPRESSION: COPD without acute abnormality. Electronically Signed   By: Inez Catalina M.D.   On: 12/15/2018 16:30   Nm Myocar Multi W/spect W/wall Motion / Ef  Result Date: 12/16/2018 CLINICAL DATA:  Coronary disease.  Chest pain.  Prior CABG. EXAM: MYOCARDIAL IMAGING WITH SPECT (REST AND PHARMACOLOGIC-STRESS) GATED LEFT VENTRICULAR WALL MOTION STUDY LEFT VENTRICULAR EJECTION FRACTION TECHNIQUE: Standard myocardial SPECT imaging was performed  after resting intravenous injection of 10 mCi Tc-20m tetrofosmin. Subsequently, intravenous infusion of Lexiscan was performed under the supervision of the Cardiology staff. At peak effect of the drug, 30 mCi Tc-75m tetrofosmin was injected intravenously and standard myocardial SPECT imaging was performed. Quantitative gated imaging was also performed to evaluate left ventricular wall motion, and estimate left ventricular ejection fraction. COMPARISON:  Nuclear medicine perfusion scan 03/03/2018 FINDINGS: Perfusion: Large region of decreased activity in the mid basilar segment of the inferior wall and inferior septal wall which are fixed from stress to rest consistent with infarction. Findings similar to comparison exam. No evidence reversible ischemia. Wall Motion: Septal hypokinesia and dyskinesia. Left Ventricular Ejection Fraction: 45 % End diastolic volume 0000000 ml End systolic volume 69 ml IMPRESSION: 1. No reversible ischemia. Infarction in the mid and basilar segments of the septal wall and inferior septal wall. Findings similar to comparison perfusion exam 03/03/2018. 2. Septal hypokinesia and dyskinesia. Sims. Left ventricular ejection fraction 45% 4. Non invasive risk stratification*: Intermediate (stratification weighted by low ejection fraction) *2012 Appropriate Use Criteria for Coronary Revascularization Focused Update: J Am Coll Cardiol. [id number]. http://content.[email or web address].aspx?articleid=[email or web address] Electronically Signed   By: Helane Gunther.D.  On: 12/16/2018 15:47   Disposition   Pt is being discharged home today in good condition.  Follow-up Plans & Appointments    Follow-up Information    Croitoru, Mihai, MD Follow up.   Specialty: Cardiology Why: Office will call you with a follow up appt in the next few days.  Contact information: [address] [place] [address] [phone]        Doran Stabler, MD Follow up.   Specialty:  Gastroenterology Why: Follow up as scheduled Contact information: [address] [place] [address] [phone]          Discharge Instructions    Diet - low sodium heart healthy   Complete by: As directed    Increase activity slowly   Complete by: As directed        Discharge Medications     Medication List    STOP taking these medications   clopidogrel 75 MG tablet Commonly known as: PLAVIX     TAKE these medications   acetaminophen 325 MG tablet Commonly known as: TYLENOL Take 650 mg by mouth 2 (two) times daily.   aspirin 81 MG EC tablet Take 1 tablet (81 mg total) by mouth daily. Start taking on: December 17, 2018   atorvastatin 40 MG tablet Commonly known as: LIPITOR Take 1 tablet (40 mg total) by mouth daily at 6 PM.   Fish Oil 1200 MG Caps Take 1,200 mg by mouth every evening.   losartan 25 MG tablet Commonly known as: COZAAR TAKE 1 TABLET BY MOUTH DAILY What changed: when to take this   metoprolol tartrate 25 MG tablet Commonly known as: LOPRESSOR TAKE 1 TABLET BY MOUTH TWICE DAILY   nitroGLYCERIN 0.4 MG SL tablet Commonly known as: NITROSTAT Place 1 tablet (0.4 mg total) under the tongue every 5 (five) minutes as needed for chest pain.   NON FORMULARY Apply 1 application topically See admin instructions. Tridema pain cream: Rub into both shoulders 2 times a day   pantoprazole 40 MG tablet Commonly known as: PROTONIX Take 1 tablet (40 mg total) by mouth daily.        Acute coronary syndrome (MI, NSTEMI, STEMI, etc) this admission?: No.     Outstanding Labs/Studies   Follow up with GI.   Duration of Discharge Encounter   Greater than 30 minutes including physician time.  Signed, Reino Bellis NP-C 12/16/2018, 4:46 PM

## 2018-12-16 NOTE — ED Notes (Signed)
Pt not in room during hourly rounds.

## 2018-12-16 NOTE — Progress Notes (Signed)
Progress Note  Patient Name: [name]. Date of Encounter: 12/16/2018  Primary Cardiologist: Lizandro Spellman Electrophysiologist: [name], MD   Subjective   No longer hurting.  Denies any dyspnea at rest. He has had this chest discomfort off and on for the last 2 weeks.  He also reports that he "loses his voice" if he talks too long.  He develops hoarseness and a very soft voice. Left shoulder discomfort radiating to the neck but also anterior chest pressure which she describes as similar to his previous angina pectoris.  Apparently had improvement with sublingual nitroglycerin.  Inpatient Medications    Scheduled Meds: . [START ON 12/17/2018] aspirin EC  81 mg Oral Daily  . atorvastatin  40 mg Oral q1800  . enoxaparin (LOVENOX) injection  40 mg Subcutaneous Q24H  . losartan  25 mg Oral Daily  . metoprolol tartrate  25 mg Oral BID   Continuous Infusions:  PRN Meds: acetaminophen, nitroGLYCERIN, nitroGLYCERIN, ondansetron (ZOFRAN) IV   Vital Signs    Vitals:   12/16/18 0100 12/16/18 0200 12/16/18 0300 12/16/18 0400  BP: (!) 145/78 132/60 (!) 114/50 138/80  Pulse: 76 72 65 66  Resp: 20 16 13 17   Temp:      TempSrc:      SpO2: 95% 97% 94% 96%  Weight:      Height:       No intake or output data in the 24 hours ending 12/16/18 1006 Last 3 Weights 12/15/2018 11/19/2018 11/08/2018  Weight (lbs) 185 lb 13.6 oz 185 lb 14.4 oz 186 lb 12.8 oz  Weight (kg) 84.3 kg 84.324 kg 84.732 kg      Telemetry    Sinus rhythm- Personally Reviewed  ECG    Sinus rhythm, left bundle branch block- Personally Reviewed  Physical Exam  Appears comfortable lying in bed GEN: No acute distress.   Neck: No JVD Cardiac: RRR, 1-2/6 early peaking systolic ejection murmur, no diastolic murmurs, rubs, or gallops.  Healthy subclavian pacemaker site Respiratory: Clear to auscultation bilaterally. GI: Soft, nontender, non-distended  MS: No edema; No deformity. Neuro:  Nonfocal  Psych: Normal  affect   Labs    High Sensitivity Troponin:   Recent Labs  Lab 12/15/18 1547 12/15/18 1733 12/16/18 0453  TROPONINIHS 18* 19* 25*      Chemistry Recent Labs  Lab 12/15/18 1547  NA 140  K 4.1  CL 110  CO2 21*  GLUCOSE 135*  BUN 19  CREATININE 1.08  CALCIUM 8.8*  GFRNONAA >60  GFRAA >60  ANIONGAP 9     Hematology Recent Labs  Lab 12/15/18 1547  WBC 2.7*  RBC 3.21*  HGB 10.6*  HCT 31.3*  MCV 97.5  MCH 33.0  MCHC 33.9  RDW 13.1  PLT 79*    BNP Recent Labs  Lab 12/16/18 0234  BNP 276.4*     DDimer No results for input(s): DDIMER in the last 168 hours.   Radiology    Dg Chest 2 View  Result Date: 12/15/2018 CLINICAL DATA:  Shortness of breath and chest pain EXAM: CHEST - 2 VIEW COMPARISON:  08/25/2017 FINDINGS: Cardiac shadows within normal limits. Postsurgical changes are again seen. Pacing device is again noted. Lungs are hyperinflated bilaterally. No focal infiltrate or sizable effusion is seen. Prior exam prior TAVR are noted as well. Degenerative changes of the thoracic spine are seen. IMPRESSION: COPD without acute abnormality. Electronically Signed   By: Inez Catalina M.D.   On: 12/15/2018 16:30  Cardiac Studies   Reviewed echocardiogram at bedside.  EF around 40% with inferior wall akinesis, unchanged from previous studies.  TAVR stent valve appears well-seated and there is only mild perivalvular leak.  Normal trans-prosthetic gradients.  No pericardial effusion.  Patient Profile     84 y.o. male with an extensive history of coronary artery disease including remote CABG 1997 and PCI (most recently rotational atherectomy and drug-eluting stent to the ostium of the RCA July 2019), s/p TAVR for severe aortic stenosis (2017, Edwards Sapien 3 29 mm), dual-chamber permanent pacemaker [employer]) for sick sinus syndrome and tachycardia-bradycardia syndrome, paroxysmal atrial fibrillation (not on anticoagulation), small-medium size AAA, chronic LBBB  esophageal cancer status post chemo- and radiation therapy.  Assessment & Plan    1. Chest pain/CAD s/p CABG: As before it is very challenging to say whether his current chest pain syndrome represents angina or if it is related to his esophageal disease.    Minimal elevation in flat pattern of cardiac troponin suggests low risk for acute coronary syndrome.  ECG is nondiagnostic due to left bundle branch block.  Remains in target range for possible in-stent restenosis following the ostial right coronary artery stent performed in 2019.  Lexiscan Myoview today. He is on statin, clopidogrel and beta blocker. 2. AS s/p TAVR: Normal prosthesis (Edwards Sapien 3, 29 mm) by echo June 2019 and echo today. 3. SSS s/p PPM:  Normal device function by last remote download December 06, 2018.  He actually requires pacing very infrequently. 4. PAFib:  His overall burden of atrial fibrillation has been extremely low, well under 1% and no episodes have been recorded recently. He is off anticoagulation since he received his stent and needed to take clopidogrel.  We will not resume anticoagulants until we stop his clopidogrel (if we detect atrial fibrillation again on his device) 5. PM: Normally functioning dual-chamber permanent pacemaker, his device is followed by Dr. [name]. 6. HTN: Well-controlled 7. HLP: Excellent recent lipid profile with LDL 64 on labs today. 8. PAD/AAA: right external iliac artery stenosis and moderate fusiform infrarenal abdominal aortic aneurysm.    Most recent ultrasound performed in October 08, 2018 showed stable aneurysm size at 4.4 cm.  On beta-blockers.   9. Esophageal cancer status post radiation and chemotherapy.    He has a follow-up appointment with Dr. Loletha Carrow in less than 2 weeks.    Consider referral to ENT due to his complaints of hoarseness.  Possibilities include recurrent laryngeal nerve injury from radiation, GERD or progression of neoplasm. 10. OSA: Reports compliance with CPAP.    11. Mild pancytopenia: going on for > 1 year. Presumed due to chemotherapy and probably cirrhosis.  Saw Dr. Benay Spice earlier this month. Some improvement since last December. Will stop the clopidogrel if no evidence of ischemia.  For questions or updates, please contact [place] Please consult [email or web address] for contact info under        Signed, Sanda Klein, MD  12/16/2018, 10:06 AM

## 2018-12-17 ENCOUNTER — Telehealth: Payer: Self-pay | Admitting: Oncology

## 2018-12-17 NOTE — Telephone Encounter (Signed)
Patient states his PCP already referred him to ENT Dr Lind Guest 2 weeks ago for his nosebleeds. The ENT doctor did a nose cauterization to treat his nosebleeds.

## 2018-12-17 NOTE — Telephone Encounter (Signed)
Returned patient's phone call regarding rescheduling an appointment, per patient's request appointment time has changed.

## 2018-12-20 ENCOUNTER — Inpatient Hospital Stay: Payer: Medicare Other

## 2018-12-20 ENCOUNTER — Inpatient Hospital Stay: Payer: Medicare Other | Attending: Oncology

## 2018-12-20 ENCOUNTER — Other Ambulatory Visit: Payer: Self-pay

## 2018-12-20 DIAGNOSIS — L989 Disorder of the skin and subcutaneous tissue, unspecified: Secondary | ICD-10-CM | POA: Insufficient documentation

## 2018-12-20 DIAGNOSIS — R911 Solitary pulmonary nodule: Secondary | ICD-10-CM | POA: Insufficient documentation

## 2018-12-20 DIAGNOSIS — J3801 Paralysis of vocal cords and larynx, unilateral: Secondary | ICD-10-CM | POA: Diagnosis not present

## 2018-12-20 DIAGNOSIS — C154 Malignant neoplasm of middle third of esophagus: Secondary | ICD-10-CM | POA: Diagnosis present

## 2018-12-20 DIAGNOSIS — J449 Chronic obstructive pulmonary disease, unspecified: Secondary | ICD-10-CM | POA: Insufficient documentation

## 2018-12-20 DIAGNOSIS — D649 Anemia, unspecified: Secondary | ICD-10-CM | POA: Insufficient documentation

## 2018-12-20 DIAGNOSIS — D61818 Other pancytopenia: Secondary | ICD-10-CM | POA: Diagnosis not present

## 2018-12-20 DIAGNOSIS — I2510 Atherosclerotic heart disease of native coronary artery without angina pectoris: Secondary | ICD-10-CM | POA: Insufficient documentation

## 2018-12-20 DIAGNOSIS — I739 Peripheral vascular disease, unspecified: Secondary | ICD-10-CM | POA: Diagnosis not present

## 2018-12-20 DIAGNOSIS — Z950 Presence of cardiac pacemaker: Secondary | ICD-10-CM | POA: Insufficient documentation

## 2018-12-20 DIAGNOSIS — Z8546 Personal history of malignant neoplasm of prostate: Secondary | ICD-10-CM | POA: Insufficient documentation

## 2018-12-20 LAB — CBC WITH DIFFERENTIAL (CANCER CENTER ONLY)
Abs Immature Granulocytes: 0 10*3/uL (ref 0.00–0.07)
Basophils Absolute: 0 10*3/uL (ref 0.0–0.1)
Basophils Relative: 0 %
Eosinophils Absolute: 0 10*3/uL (ref 0.0–0.5)
Eosinophils Relative: 2 %
HCT: 29.3 % — ABNORMAL LOW (ref 39.0–52.0)
Hemoglobin: 9.8 g/dL — ABNORMAL LOW (ref 13.0–17.0)
Immature Granulocytes: 0 %
Lymphocytes Relative: 21 %
Lymphs Abs: 0.5 10*3/uL — ABNORMAL LOW (ref 0.7–4.0)
MCH: 32.7 pg (ref 26.0–34.0)
MCHC: 33.4 g/dL (ref 30.0–36.0)
MCV: 97.7 fL (ref 80.0–100.0)
Monocytes Absolute: 0.4 10*3/uL (ref 0.1–1.0)
Monocytes Relative: 17 %
Neutro Abs: 1.5 10*3/uL — ABNORMAL LOW (ref 1.7–7.7)
Neutrophils Relative %: 60 %
Platelet Count: 62 10*3/uL — ABNORMAL LOW (ref 150–400)
RBC: 3 MIL/uL — ABNORMAL LOW (ref 4.22–5.81)
RDW: 13.1 % (ref 11.5–15.5)
WBC Count: 2.4 10*3/uL — ABNORMAL LOW (ref 4.0–10.5)
nRBC: 0 % (ref 0.0–0.2)

## 2018-12-23 ENCOUNTER — Other Ambulatory Visit: Payer: Self-pay | Admitting: Otolaryngology

## 2018-12-23 DIAGNOSIS — C154 Malignant neoplasm of middle third of esophagus: Secondary | ICD-10-CM

## 2018-12-23 DIAGNOSIS — R490 Dysphonia: Secondary | ICD-10-CM

## 2018-12-28 ENCOUNTER — Ambulatory Visit (INDEPENDENT_AMBULATORY_CARE_PROVIDER_SITE_OTHER): Payer: Medicare Other | Admitting: Gastroenterology

## 2018-12-28 ENCOUNTER — Encounter: Payer: Self-pay | Admitting: Gastroenterology

## 2018-12-28 VITALS — BP 142/82 | HR 66 | Temp 97.9°F | Ht 67.0 in | Wt 185.2 lb

## 2018-12-28 DIAGNOSIS — C159 Malignant neoplasm of esophagus, unspecified: Secondary | ICD-10-CM | POA: Diagnosis not present

## 2018-12-28 DIAGNOSIS — R0789 Other chest pain: Secondary | ICD-10-CM | POA: Diagnosis not present

## 2018-12-28 DIAGNOSIS — R490 Dysphonia: Secondary | ICD-10-CM | POA: Diagnosis not present

## 2018-12-28 NOTE — Patient Instructions (Signed)
If you are age 82 or older, your body mass index should be between 23-30. Your Body mass index is 29.01 kg/m. If this is out of the aforementioned range listed, please consider follow up with your Primary Care Provider.  If you are age 73 or younger, your body mass index should be between 19-25. Your Body mass index is 29.01 kg/m. If this is out of the aformentioned range listed, please consider follow up with your Primary Care Provider.   It was a pleasure to see you today!  Dr. Loletha Carrow

## 2018-12-28 NOTE — Progress Notes (Signed)
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  Last endoscopy January 2020 showed no visible tumor. Patient recently admitted with chest pain, was seen by Dr. Sallyanne Kuster, whose progress note the day of discharge was as follows: "No reversible ischemia and perfusion pattern and EF unchanged since January. Recommend evaluation for a Sims cause of his symptoms, will discuss with his Sims specialist. No plan for additional inpatient CV evaluation at this time.   Sanda Klein, MD, [facility]"  Dr. Sallyanne Kuster also call me that day to discuss the case.  He was concerned with the chest discomfort and reports of dysphonia, the [facility] may have recurrence of his malignancy.  He was seen by ENT a week ago, with reported plans for CT scan neck and chest.  [name] is here with his daughter [name].  A few weeks ago he developed left-sided nonexertional chest pain radiating to the neck and upper back.  He has had progressive hoarseness.  He does not seem to have dysphagia to solids or liquids, but sometimes coughs when he drinks liquids.  He has progressively fatigued lately.  No recent weight loss.  Remainder of systems negative except as above  [name] Past Medical, Family and Social History were reviewed and are on file in the EMR.  Objective:  Med list reviewed  Current Outpatient Medications:  .  acetaminophen (TYLENOL) 325 MG tablet, Take 650 mg by mouth 2 (two) times daily., Disp: , Rfl:  .  aspirin EC 81 MG EC tablet, Take 1 tablet (81 mg total) by mouth daily., Disp:  , Rfl:  .  atorvastatin (LIPITOR) 40 MG tablet, Take 1 tablet (40 mg total) by mouth daily at 6 PM., Disp: 30 tablet, Rfl: 0 .  losartan (COZAAR) 25 MG tablet, TAKE 1 TABLET BY MOUTH DAILY (Patient taking differently: Take 25 mg by mouth at bedtime. ), Disp:  90 tablet, Rfl: 3 .  metoprolol tartrate (LOPRESSOR) 25 MG tablet, TAKE 1 TABLET BY MOUTH TWICE DAILY (Patient taking differently: Take 25 mg by mouth 2 (two) times daily. ), Disp: 180 tablet, Rfl: 3 .  nitroGLYCERIN (NITROSTAT) 0.4 MG SL tablet, Place 1 tablet (0.4 mg total) under the tongue every 5 (five) minutes as needed for chest pain., Disp: 25 tablet, Rfl: 2 .  NON FORMULARY, Apply 1 application topically See admin instructions. Tridema pain cream: Rub into both shoulders 2 times a day, Disp: , Rfl:  .  Omega-3 Fatty Acids (FISH OIL) 1200 MG CAPS, Take 1,200 mg by mouth every evening., Disp: , Rfl:  .  pantoprazole (PROTONIX) 40 MG tablet, Take 1 tablet (40 mg total) by mouth daily., Disp: 90 tablet, Rfl: 3   Vital signs in last 24 hrs: Vitals:   12/28/18 1345  BP: (!) 142/82  Pulse: 66  Temp: 97.9 F (36.6 C)  SpO2: 98%    Physical Exam  Severe dysphonia.  He is not dyspneic at rest.   HEENT: sclera anicteric, oral mucosa moist without lesions.  No stridor  Neck: supple, no thyromegaly, JVD or lymphadenopathy  Cardiac: RRR without murmurs, S1S2 heard, no peripheral edema.  No chest wall tenderness or visible abnormality  Pulm: clear to auscultation bilaterally, normal RR and effort noted  Abdomen: soft, no tenderness, with active bowel sounds. No guarding or palpable hepatosplenomegaly.  Skin; warm and dry, no jaundice  or rash  Recent Labs:  CBC Latest Ref Rng & Units 12/20/2018 12/15/2018 11/19/2018  WBC 4.0 - 10.5 K/uL 2.4(L) 2.7(L) 3.4(L)  Hemoglobin 13.0 - 17.0 g/dL 9.8(L) 10.6(L) 9.1(L)  Hematocrit 39.0 - 52.0 % 29.3(L) 31.3(L) 27.4(L)  Platelets 150 - 400 K/uL 62(L) 79(L) 58(L)     Radiologic studies:  CLINICAL DATA:  Shortness of breath and chest pain   EXAM: CHEST - 2 VIEW   COMPARISON:  08/25/2017   FINDINGS: Cardiac shadows within normal limits. Postsurgical changes are again seen. Pacing device is again noted. Lungs are hyperinflated bilaterally.  No focal infiltrate or sizable effusion is seen. Prior exam prior TAVR are noted as well. Degenerative changes of the thoracic spine are seen.   IMPRESSION: COPD without acute abnormality.     Electronically Signed   By: Inez Catalina M.D.   On: 12/15/2018 16:30   @ASSESSMENTPLANBEGIN @ Assessment: Encounter Diagnoses  Name Primary?  . Squamous cell esophageal cancer ([place]) Yes  . Non-cardiac chest pain   . Dysphonia    All of this is very worrisome for recurrence of his malignancy with local advancement and possibly involvement of his recurrent laryngeal nerve.  I was originally planning upper endoscopy, but given the nature of the symptoms and the recent rapid progression, I will wait until his CT scan results from tomorrow to decide how to proceed.  If there is obvious recurrence, upper endoscopy with tissue biopsy may not be necessary.  We will copy my note to his oncologist to be sure they are on the look out for the scan results.   Total time 25 minutes, over half spent face-to-face with patient in counseling and coordination of care.   [name]

## 2018-12-29 ENCOUNTER — Ambulatory Visit
Admission: RE | Admit: 2018-12-29 | Discharge: 2018-12-29 | Disposition: A | Discharge status: Home / Self Care | Payer: Medicare Other | Source: Ambulatory Visit | Attending: Otolaryngology | Admitting: Otolaryngology

## 2018-12-29 DIAGNOSIS — R490 Dysphonia: Secondary | ICD-10-CM

## 2018-12-29 DIAGNOSIS — C154 Malignant neoplasm of middle third of esophagus: Secondary | ICD-10-CM

## 2018-12-29 MED ORDER — IOPAMIDOL (ISOVUE-300) INJECTION 61%
100.0000 mL | Freq: Once | INTRAVENOUS | Status: AC | PRN
  Administered 2018-12-29: 100 mL via INTRAVENOUS

## 2019-01-04 ENCOUNTER — Telehealth: Payer: Self-pay | Admitting: Oncology

## 2019-01-04 NOTE — Telephone Encounter (Signed)
LT PAL 11/4 follow up moved to [facility]. Confirmed with patient. Message to desk nurse re patient inquiry of [facility] receiving notes/ct from his other doctor.

## 2019-01-05 ENCOUNTER — Other Ambulatory Visit: Payer: Self-pay | Admitting: Cardiology

## 2019-01-07 ENCOUNTER — Telehealth: Payer: Self-pay | Admitting: Oncology

## 2019-01-07 NOTE — Telephone Encounter (Signed)
Scheduled appt Per 10/19 sch message  - pt is aware of appt date and time

## 2019-01-10 ENCOUNTER — Inpatient Hospital Stay (HOSPITAL_BASED_OUTPATIENT_CLINIC_OR_DEPARTMENT_OTHER): Payer: Medicare Other | Admitting: Oncology

## 2019-01-10 ENCOUNTER — Encounter: Payer: Self-pay | Admitting: *Deleted

## 2019-01-10 ENCOUNTER — Telehealth: Payer: Self-pay | Admitting: Oncology

## 2019-01-10 ENCOUNTER — Inpatient Hospital Stay: Payer: Medicare Other

## 2019-01-10 ENCOUNTER — Other Ambulatory Visit: Payer: Self-pay

## 2019-01-10 VITALS — BP 157/76 | HR 67 | Temp 98.7°F | Resp 17 | Ht 67.0 in | Wt 188.1 lb

## 2019-01-10 DIAGNOSIS — C154 Malignant neoplasm of middle third of esophagus: Secondary | ICD-10-CM

## 2019-01-10 LAB — CBC WITH DIFFERENTIAL (CANCER CENTER ONLY)
Abs Immature Granulocytes: 0.01 10*3/uL (ref 0.00–0.07)
Basophils Absolute: 0 10*3/uL (ref 0.0–0.1)
Basophils Relative: 0 %
Eosinophils Absolute: 0.1 10*3/uL (ref 0.0–0.5)
Eosinophils Relative: 2 %
HCT: 28.3 % — ABNORMAL LOW (ref 39.0–52.0)
Hemoglobin: 9.3 g/dL — ABNORMAL LOW (ref 13.0–17.0)
Immature Granulocytes: 0 %
Lymphocytes Relative: 19 %
Lymphs Abs: 0.5 10*3/uL — ABNORMAL LOW (ref 0.7–4.0)
MCH: 31.8 pg (ref 26.0–34.0)
MCHC: 32.9 g/dL (ref 30.0–36.0)
MCV: 96.9 fL (ref 80.0–100.0)
Monocytes Absolute: 0.3 10*3/uL (ref 0.1–1.0)
Monocytes Relative: 14 %
Neutro Abs: 1.5 10*3/uL — ABNORMAL LOW (ref 1.7–7.7)
Neutrophils Relative %: 65 %
Platelet Count: 70 10*3/uL — ABNORMAL LOW (ref 150–400)
RBC: 2.92 MIL/uL — ABNORMAL LOW (ref 4.22–5.81)
RDW: 13.4 % (ref 11.5–15.5)
WBC Count: 2.4 10*3/uL — ABNORMAL LOW (ref 4.0–10.5)
nRBC: 0 % (ref 0.0–0.2)

## 2019-01-10 NOTE — Progress Notes (Signed)
[name] OFFICE PROGRESS NOTE   Diagnosis: Esophagus cancer  INTERVAL HISTORY:   Mr. Fronczak returns prior to scheduled visit.  He has developed hoarseness.  He saw [name] on 12/20/2018 and was diagnosed with left vocal cord paralysis. A CT of the neck: 04/30/2018 was negative.  A CT of the chest revealed a 2 cm at the anterior wall of the proximal esophagus.  A new 7 mm left upper lobe nodule is nonspecific.  Changes of cirrhosis.  He denies dysphagia.  He complains of intermittent pain at the scalp, left shoulder, and anterior chest.  He has intermittent scalp lesions.  Objective:  Vital signs in last 24 hours:  Blood pressure (!) 157/76, pulse 67, temperature 98.7 F (37.1 C), temperature source Temporal, resp. rate 17, height '5\' 7"'  (1.702 m), weight 188 lb 1.6 oz (85.3 kg), SpO2 98 %.    HEENT: Neck without mass Lymphatics: No cervical, supraclavicular, or axillary nodes Resp: Lungs clear bilaterally Cardio: Regular rate and rhythm GI: No hepatomegaly, no mass, nontender Vascular: No leg edema  Skin: Multiple papules at the parietal and occipital scalp    Lab Results:  Lab Results  Component Value Date   WBC 2.4 (L) 01/10/2019   HGB 9.3 (L) 01/10/2019   HCT 28.3 (L) 01/10/2019   MCV 96.9 01/10/2019   PLT 70 (L) 01/10/2019   NEUTROABS 1.5 (L) 01/10/2019    CMP  Lab Results  Component Value Date   NA 140 12/15/2018   K 4.1 12/15/2018   CL 110 12/15/2018   CO2 21 (L) 12/15/2018   GLUCOSE 135 (H) 12/15/2018   BUN 19 12/15/2018   CREATININE 1.08 12/15/2018   CALCIUM 8.8 (L) 12/15/2018   PROT 6.0 11/08/2018   ALBUMIN 3.5 (L) 11/08/2018   AST 25 11/08/2018   ALT 13 11/08/2018   ALKPHOS 113 11/08/2018   BILITOT 0.6 11/08/2018   GFRNONAA >60 12/15/2018   GFRAA >60 12/15/2018    Medications: I have reviewed the patient's current medications.   Assessment/Plan: 1. 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  Staging CTs 10/26/2017-asymmetric esophageal thickening, no evidence of metastatic disease ? Endoscopic biopsy 10/20/2017-mid esophagus partially obstructing mass, biopsy confirmed squamous cell carcinoma ? PET scan 11/02/2017-wall thickening in the mid esophagus SUV 27 MA: No findings specific for metastatic disease ? Radiation 11/09/2017-12/17/2017 ? Cycle 1 weekly Taxol/carboplatin 11/10/2017 ? Cycle 2 weekly Taxol/carboplatin 11/17/2017 (carboplatin held due to thrombocytopenia) ? Cycle 3 weekly Taxol/carboplatin 12/08/2017 (dosesreduced due to neutropenia, thrombocytopenia) ? Cycle 4 weekly Taxol/carboplatin 12/15/2017 ? Endoscopy 03/29/2018- no mass seen ? CT chest 12/29/2018-2 cm area associated with anterior wall the proximal esophagus, new-nonspecific, changes of cirrhosis 2. Solid dysphagia and odynophagia secondary to #1-improved 3. Coronary artery disease 4. History of prostate cancer 5. COPD 6. Permanent cardiac pacemaker 7. Peripheral vascular disease 8. Chronic thrombocytopenia 9. 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 CT chest 12/28/2017-no evidence of pulmonary emboli. Esophageal thickening consistent with patient's history of esophageal carcinoma. 11. Probable cirrhosis, referred to GI 12. Anemia-progressive compared to when he was here in June, potentially related to GI bleeding or epistaxis. 13. Hoarseness secondary to left vocal cord paralysis, likely due to recurrent tumor in the mediastinum     Disposition:  Mr.Shaff has developed left vocal cord paralysis, this is very likely secondary to recurrent tumor in the mediastinum.  I reviewed the CT images and discussed treatment options with Mr. Hinks.  His niece was present for  today's visit.  He understands the high likelihood of the paraesophageal mass representing recurrence of the esophagus cancer.  No therapy will be curative.  We discussed treatment options.  He  will see [name] this week to discuss treatment options for management of vocal cord paralysis.  He may be a candidate for vocal cord stabilization.  We will submit the 2019 tumor biopsy for PD-L1 testing.  He may be a candidate for salvage treatment with immunotherapy.  We also discussed the possibility of salvage chemotherapy.  He is not a candidate for surgery.  He will return for an office visit and further discussion next week.   He has persistent pancytopenia, likely secondary to cirrhosis.  The anemia may be in part related to GI bleeding.  Betsy Coder, MD  01/10/2019  1:47 PM

## 2019-01-10 NOTE — Progress Notes (Signed)
Per Dr. Gearldine Shown request: Email to [facility] pathology requesting PDL-1 tumor score on case [id number].

## 2019-01-10 NOTE — Telephone Encounter (Signed)
Gave patient avs report and appointments for November.  °

## 2019-01-11 ENCOUNTER — Other Ambulatory Visit (HOSPITAL_COMMUNITY)
Admission: RE | Admit: 2019-01-11 | Discharge: 2019-01-11 | Disposition: A | Discharge status: Home / Self Care | Payer: Medicare Other | Source: Ambulatory Visit | Attending: Oncology | Admitting: Oncology

## 2019-01-11 DIAGNOSIS — C159 Malignant neoplasm of esophagus, unspecified: Secondary | ICD-10-CM | POA: Insufficient documentation

## 2019-01-12 NOTE — Progress Notes (Addendum)
Cardiology Office Note:    Date:  01/13/2019   ID:  Curtis Newcomer., DOB April 20, 1934, MRN [id number]  PCP:  [name], MD  Cardiologist:  [name], MD   Referring MD: [name], *   Follow-up for coronary artery disease  History of Present Illness:    Curtis Demick. is a 84 y.o. male with a hx of AS s/p TAVR in 2017 [facility]ack), CABG in 1997, heart cath in 09/2017 revealed patent LIMA-LAD, SVG-Diag, sequential SVG-ramus and OM3, but occluded SVG-RCA which was treated by rotational atherectomy and DES, small infrarenal abdominal aortic aneurysm (4.4 cm), PAD, PAF not on anticoagulation, symptomatic sinus bradycardia s/p dual-chamber PPM, OSA on CPAP, chronic back pain, GERD, HTN, HLD, thrombocytopenia, prostate cancer, and esophageal cancer s/p radiation therapy. He presented to [facility] on 12/15/18 with chest pain. Cardiology was consulted and he underwent nuclear stress test which revealed no new areas of ischemia and EF of 45%, which is consistent with echocardiogram 08/26/17 and echo that admission 12/16/18 which showed an EF of 45-50%. Chest pain was felt to be related to his esophageal disease.  An evaluation from GI was recommended.  He presents today for follow up and states he is still having intermittent anterior chest discomfort that lasts for seconds to minutes and goes away with rest.  He states that he went to his ear nose and throat provider, and had the inside of his nose cauterized due to frequent nosebleeds prior to his recent hospital admission.  He also states that he has been back to his oncologist for a recurrent esophageal/mediastinal tumor which she will start treatment for next Thursday.   He states that for the past 5 years he is also noticed dark red blood in the stool and has not had a colonoscopy in the last 5 years.  I will repeat his CBC today and he has agreed to schedule an appointment with  GI this week with Dr. Loletha Carrow.  He denies chest pain,  increased shortness of breath, lower extremity edema, fatigue, palpitations, melena, hematuria, hemoptysis, diaphoresis, weakness, presyncope, syncope, orthopnea, and PND.   Past Medical History:  Diagnosis Date  . AAA (abdominal aortic aneurysm) ([place])    a. Last duplex - 3.8cm 01/2013 - due 01/2014.  [name] Acne rosacea   . Alcoholic hepatitis with ascites 08/28/2017  . Anginal pain ([place]) 1996  . Aortic stenosis, mild    Last echo 05/04/12 +LVH  . Arthritis    "shoulders" (09/01/2017)  . Back pain    hx epidural injections  . Baker's cyst    Lt.  [name] CAD (coronary artery disease)    a. s/p MI/PTCA - Cx 1994. b. s/p CABG x5 in 1997. c. Abnl nuc 2003- occ SVG-RCA, patent seq SVG-OM1-dCxOM, patentl LIMA-LAD, patent, SVG-diag. d. Normal nuc 09/2012.  . Carotid artery disease ([place])    a. Duplex 01/2013: mildly abnormal. Mild plaque without significant diameter reduction. Repeat recommended 11/15.  [name] COPD (chronic obstructive pulmonary disease) ([place])   . Dizziness and giddiness 08/04/2013  . GERD (gastroesophageal reflux disease)   . H/O: GI bleed    mild, neg. colonoscopy  . Heart murmur   . HOH (hard of hearing)   . HTN (hypertension)   . Hyperlipidemia   . Hypertensive heart disease   . Myocardial infarction ([place]) 1994  . OSA on CPAP   . OSA treated with BiPAP 11/20/2015   Severe with Lifescape 38/hr  . PAF (paroxysmal atrial fibrillation) ([place])  a. Remote per records.   . Pneumonia    history  . Presence of permanent cardiac pacemaker    DOI 1999 with change out 2006; St Jude  . Prostate cancer ([place]) ~ 2010   a. s/p radical retropubic prostatectomy with bilateral pelvic lymph node dissection  . PVD (peripheral vascular disease) ([place])    a. Rt ext iliac stenosis, last PV cath 2003, moderate stenosis last Lower Ext Dopplers 12/16/11 - Rt. ABI 0.96  Lt ABI 1.0.  . Sick sinus syndrome ([place])    a. placed 1999, gen change 2011 - St. Jude.  . Sinus node dysfunction ([place]) 01/20/2013  . Syncope  11/28/97  . Thrombocytopenia ([facility])    chronic, mild  . Valvular heart disease    a. Echo 04/2012: mild-mod AS, mild AI, mild MR.    Past Surgical History:  Procedure Laterality Date  . APPENDECTOMY    . BIOPSY  10/20/2017   Procedure: BIOPSY;  Surgeon: Doran Stabler, MD;  Location: [facility];  Service: Gastroenterology;;  . CARDIAC CATHETERIZATION  2003   VG to RCA occluded, other grafts patent  . CARDIAC CATHETERIZATION N/A 05/04/2015   Procedure: Right/Left Heart Cath and Coronary/Graft Angiography;  Surgeon: Sherren Mocha, MD;  Location: [place] [facility];  Service: Cardiovascular;  Laterality: N/A;  . CARDIAC CATHETERIZATION  09/01/2017  . CARDIAC VALVE REPLACEMENT    . CATARACT EXTRACTION W/ INTRAOCULAR LENS  IMPLANT, BILATERAL Bilateral   . COLONOSCOPY    . CORONARY ANGIOPLASTY  03/29/92   PTCA to LCX  . CORONARY ARTERY BYPASS GRAFT  1997   LIMA-LAD; VG-diag; seq VG-1st OM & distal LCX; VG-RCA  . CORONARY ATHERECTOMY N/A 09/01/2017   Procedure: CORONARY ATHERECTOMY;  Surgeon: Jettie Booze, MD;  Location: [place] [facility];  Service: Cardiovascular;  Laterality: N/A;  . CORONARY ATHERECTOMY  10/01/2017  . CORONARY ATHERECTOMY N/A 10/01/2017   Procedure: CORONARY ATHERECTOMY;  Surgeon: Leonie Man, MD;  Location: [place] [facility];  Service: Cardiovascular;  Laterality: N/A;  . ESOPHAGOGASTRODUODENOSCOPY (EGD) WITH PROPOFOL N/A 10/20/2017   Procedure: ESOPHAGOGASTRODUODENOSCOPY (EGD) WITH PROPOFOL;  Surgeon: Doran Stabler, MD;  [facility]: [facility];  Service: Gastroenterology;  Laterality: N/A;  . ESOPHAGOGASTRODUODENOSCOPY (EGD) WITH PROPOFOL N/A 03/29/2018   Procedure: ESOPHAGOGASTRODUODENOSCOPY (EGD) WITH PROPOFOL;  Surgeon: Doran Stabler, MD;  Location: [facility];  Service: Gastroenterology;  Laterality: N/A;  . INSERT / REPLACE / REMOVE PACEMAKER  11/28/1997   pacesetter--ERI 2011  . LEFT HEART CATH N/A 09/01/2017   Procedure: Left Heart Cath;   Surgeon: Jettie Booze, MD;  Location: [place] [facility];  Service: Cardiovascular;  Laterality: N/A;  . LEFT HEART CATH AND CORS/GRAFTS ANGIOGRAPHY N/A 08/28/2017   Procedure: LEFT HEART CATH AND CORS/GRAFTS ANGIOGRAPHY;  Surgeon: Leonie Man, MD;  Location: [place] [facility];  Service: Cardiovascular;  Laterality: N/A;  . PACEMAKER GENERATOR CHANGE  08/15/2009   St. Jude accent  . SUPRAPUBIC PROSTATECTOMY    . TEE WITHOUT CARDIOVERSION N/A 06/12/2015   Procedure: TRANSESOPHAGEAL ECHOCARDIOGRAM (TEE);  Surgeon: Sherren Mocha, MD;  Location: [facility];  Service: Open Heart Surgery;  Laterality: N/A;  . TONSILLECTOMY    . TRANSCATHETER AORTIC VALVE REPLACEMENT, TRANSFEMORAL N/A 06/12/2015   Procedure: TRANSCATHETER AORTIC VALVE REPLACEMENT, TRANSFEMORAL, possible transpical;  Surgeon: Sherren Mocha, MD;  Location: [facility];  Service: Open Heart Surgery;  Laterality: N/A;    Current Medications: Current Meds  Medication Sig  . acetaminophen (TYLENOL) 325  MG tablet Take 650 mg by mouth 2 (two) times daily.  [name] aspirin EC 81 MG EC tablet Take 1 tablet (81 mg total) by mouth daily.  [name] atorvastatin (LIPITOR) 40 MG tablet Take 1 tablet (40 mg total) by mouth daily at 6 PM.  . losartan (COZAAR) 25 MG tablet TAKE 1 TABLET BY MOUTH DAILY (Patient taking differently: Take 25 mg by mouth at bedtime. )  . metoprolol tartrate (LOPRESSOR) 25 MG tablet TAKE 1 TABLET BY MOUTH TWICE DAILY (Patient taking differently: Take 25 mg by mouth 2 (two) times daily. )  . nitroGLYCERIN (NITROSTAT) 0.4 MG SL tablet PLACE 1 TABLET UNDER THE TONGUE EVERY 5 MINUTES AS NEEDED FOR CHEST PAIN UP TO 3 DOSES, IF SYMPTOMS PERSIST CALL 911  . NON FORMULARY Apply 1 application topically See admin instructions. Tridema pain cream: Rub into both shoulders 2 times a day  . Omega-3 Fatty Acids (FISH OIL) 1200 MG CAPS Take 1,200 mg by mouth every evening.  . pantoprazole (PROTONIX) 40 MG tablet Take 1 tablet (40 mg total) by mouth  daily.     Allergies:   Patient has no known allergies.   Social History   Socioeconomic History  . Marital status: Widowed    Spouse name: Not on file  . Number of children: 5  . Years of education: 7th  . Highest education level: Not on file  Occupational History  . Occupation: Retired  Scientific laboratory technician  . Financial resource strain: Not on file  . Food insecurity    Worry: Not on file    Inability: Not on file  . Transportation needs    Medical: Not on file    Non-medical: Not on file  Tobacco Use  . Smoking status: Former Smoker    Packs/day: 2.00    Years: 48.00    Pack years: 96.00    Types: Cigarettes    Quit date: 03/18/1995    Years since quitting: 23.8  . Smokeless tobacco: Never Used  Substance and Sexual Activity  . Alcohol use: No    Comment: 09/01/2017 "nothing in the last couple years"  . Drug use: Never  . Sexual activity: Not Currently  Lifestyle  . Physical activity    Days per week: Not on file    Minutes per session: Not on file  . Stress: Not on file  Relationships  . Social Herbalist on phone: Not on file    Gets together: Not on file    Attends religious service: Not on file    Active member of club or organization: Not on file    Attends meetings of clubs or organizations: Not on file    Relationship status: Not on file  Other Topics Concern  . Not on file  Social History Narrative  . Not on file     Family History: [name] family history includes CAD in an other family member; Cancer in his brother; Colon cancer in his father; Heart Problems in his brother and sister. There is no history of Stomach cancer, Pancreatic cancer, or Esophageal cancer.  ROS:   Please see the history of present illness.     All other systems reviewed and are negative.  EKGs/Labs/Other Studies Reviewed:    The following studies were reviewed today:   TTE: 12/16/18  IMPRESSIONS   1. Left ventricular ejection fraction, by visual  estimation, is 45 to 50%. The left ventricle has mildly decreased function. Mildly increased left ventricular size. Left  ventricular septal wall thickness was normal. There is no left ventricular  hypertrophy. 2. Right ventricular volume/pressure overload. 3. Septal and apical hypokinesis with abnormal septal motion from pacing. 4. Global right ventricle has normal systolic function.The right ventricular size is normal. No increase in right ventricular wall thickness. 5. Left atrial size was normal. 6. Right atrial size was normal. 7. Moderate calcification of the mitral valve leaflet(s). 8. Moderate thickening of the mitral valve leaflet(s). 9. The mitral valve is degenerative. Mild mitral valve regurgitation. 10. The tricuspid valve is normal in structure. Tricuspid valve regurgitation is mild. 11. Aortic valve regurgitation is trivial by color flow Doppler. 12. Post TAVR 29 mm Sapien 3 gradients lower than those seen Juney 2019 with trivial PVL. 13. The pulmonic valve was grossly normal. Pulmonic valve regurgitation is mild by color flow Doppler. 14. Moderately elevated pulmonary artery systolic pressure.  Lexiscan: 12/16/18  FINDINGS: Perfusion: Large region of decreased activity in the mid basilar segment of the inferior wall and inferior septal wall which are fixed from stress to rest consistent with infarction. Findings similar to comparison exam. No evidence reversible ischemia.  Wall Motion: Septal hypokinesia and dyskinesia.  Left Ventricular Ejection Fraction: 45 %  End diastolic volume 0000000 ml  End systolic volume 69 ml  IMPRESSION: 1. No reversible ischemia. Infarction in the mid and basilar segments of the septal wall and inferior septal wall. Findings similar to comparison perfusion exam 03/03/2018.  2. Septal hypokinesia and dyskinesia.  3. Left ventricular ejection fraction 45%  4. Non invasive risk stratification*: Intermediate  (stratification weighted by low ejection fraction)   EKG:  EKG is normal sinus rhythm with first-degree AV block 67 bpm ordered today.    EKG 12/16/2018 Sinus rhythm with first-degree AV block 82 bpm  Recent Labs: 11/08/2018: ALT 13 12/15/2018: BUN 19; Creatinine, Ser 1.08; Potassium 4.1; Sodium 140 12/16/2018: B Natriuretic Peptide 276.4 01/10/2019: Hemoglobin 9.3; Platelet Count 70  Recent Lipid Panel    Component Value Date/Time   CHOL 134 12/16/2018 0234   CHOL 132 11/08/2018 0910   TRIG 117 12/16/2018 0234   HDL 47 12/16/2018 0234   HDL 50 11/08/2018 0910   CHOLHDL 2.9 12/16/2018 0234   VLDL 23 12/16/2018 0234   LDLCALC 64 12/16/2018 0234   LDLCALC 51 11/08/2018 0910    Physical Exam:    VS:  BP 138/72   Pulse 69   Temp 98.4 F (36.9 C)   Ht 5\' 7"  (1.702 m)   Wt 189 lb 3.2 oz (85.8 kg)   SpO2 95%   BMI 29.63 kg/m     Wt Readings from Last 3 Encounters:  01/13/19 189 lb 3.2 oz (85.8 kg)  01/10/19 188 lb 1.6 oz (85.3 kg)  12/28/18 185 lb 3.2 oz (84 kg)     GEN:  Well nourished, well developed in no acute distress HEENT: Hoarse voice, increased sputum production. NECK: No JVD; No carotid bruits LYMPHATICS: No lymphadenopathy CARDIAC: RRR, no murmurs, rubs, gallops RESPIRATORY:  Clear to auscultation without rales, wheezing or rhonchi  ABDOMEN: Soft, non-tender, non-distended MUSCULOSKELETAL:  No edema; No deformity  SKIN: Warm and dry NEUROLOGIC:  Alert and oriented x 3 PSYCHIATRIC:  Normal affect   ASSESSMENT/Plan   CAD s/p CABG, DES to ostial RCA in 2019- recent nuclear stress test 12/16/2018 EF 45 to 50% without new areas of ischemia continue ASA 81 mg tablet daily Continue atorvastatin 40 mg tablet daily Continue metoprolol tartrate 25 mg tablet twice daily  Continue nitroglycerin  0.4 mg sublingual tablet as needed - plavix was held on 12/16/18 in anticipation for his endoscopy with GI 2 weeks after discharge.  This did not occur. Restart Plavix after  clearance from GI  GI bleed-patient reports dark red blood in his stool intermittently for the past 5 years.  Hemoglobin 9/30 -10.6, 10/5-9.8, 10/26 9.3,  He did not follow-up with GI after discharge. Schedule appointment with GI for EGD and colonoscopy with Dr. Loletha Carrow.  I will forward my note to Dr. Loletha Carrow. Order CBC Received message from Dr. Loletha Carrow GI and Dr. Sallyanne Kuster consulted.  Have agreed to stop Plavix permanently due to duration since then and patient hemoglobin.  01/14/2019  AS s/p TAVR - stable by echo 12/16/18 Repeat echocardiogram 12/2019  SSS s/p PPM - normal device function by remote device check 12/06/18 Followed by EP  PAF-no recurrent episodes of atrial fibrillation.  EKG today shows sinus rhythm with first-degree AV block 67 bpm  - Afib burden is low < 1% with no recent episodes - AC was stopped after last heart cath with intervention in 2019 which necessitated the need for plavix - plan to hold [facility] while still on plavix Restart Plavix after clearance from GI Received message from Dr. Loletha Carrow GI and Dr. Sallyanne Kuster consulted.  Have agreed to stop Plavix permanently due to duration since then and patient hemoglobin.  01/14/2019  HTN-BP [id number].  Well-controlled at home. Continue metoprolol tartrate 25 mg tablet twice daily Continue losartan 25 mg tablet at bedtime Heart healthy low-sodium diet Increase physical activity as tolerated  HLD 12/16/2018: Cholesterol 134; HDL 47; LDL Cholesterol 64; Triglycerides 117; VLDL 23 Continue atorvastatin 40 mg tablet daily  Obstructive sleep apnea -compliant with CPAP 81% compliance on last evaluation, benefits of CPAP explained in A. fib prevention. Continue CPAP use Followed by Dr. Radford Pax  In order of problems listed above:  Paroxysmal atrial fibrillation ([place]) - Plan: EKG 12-Lead, CBC  Essential hypertension - Plan: EKG 12-Lead, CBC   Medication Adjustments/Labs and Tests Ordered: Current medicines are reviewed at length  with the patient today.  Concerns regarding medicines are outlined above.  Orders Placed This Encounter  Procedures  . CBC  . EKG 12-Lead   No orders of the defined types were placed in this encounter.  Disposition: Follow-up with Dr. Sallyanne Kuster in 2 months.    Jossie Ng. [facility] [place] [address] Office [phone] Fax [phone] [place]

## 2019-01-13 ENCOUNTER — Ambulatory Visit (INDEPENDENT_AMBULATORY_CARE_PROVIDER_SITE_OTHER): Payer: Medicare Other | Admitting: General Practice

## 2019-01-13 ENCOUNTER — Encounter: Payer: Self-pay | Admitting: Physician Assistant

## 2019-01-13 ENCOUNTER — Other Ambulatory Visit: Payer: Self-pay

## 2019-01-13 ENCOUNTER — Telehealth: Payer: Self-pay | Admitting: Gastroenterology

## 2019-01-13 VITALS — BP 138/72 | HR 69 | Temp 98.4°F | Ht 67.0 in | Wt 189.2 lb

## 2019-01-13 DIAGNOSIS — I2510 Atherosclerotic heart disease of native coronary artery without angina pectoris: Secondary | ICD-10-CM

## 2019-01-13 DIAGNOSIS — I495 Sick sinus syndrome: Secondary | ICD-10-CM

## 2019-01-13 DIAGNOSIS — E785 Hyperlipidemia, unspecified: Secondary | ICD-10-CM | POA: Diagnosis not present

## 2019-01-13 DIAGNOSIS — K922 Gastrointestinal hemorrhage, unspecified: Secondary | ICD-10-CM

## 2019-01-13 DIAGNOSIS — I10 Essential (primary) hypertension: Secondary | ICD-10-CM

## 2019-01-13 DIAGNOSIS — Z952 Presence of prosthetic heart valve: Secondary | ICD-10-CM

## 2019-01-13 DIAGNOSIS — G4733 Obstructive sleep apnea (adult) (pediatric): Secondary | ICD-10-CM

## 2019-01-13 DIAGNOSIS — I480 Paroxysmal atrial fibrillation: Secondary | ICD-10-CM | POA: Diagnosis not present

## 2019-01-13 NOTE — Progress Notes (Signed)
Thanks, Denyse Amass, keep off Plavix for now.

## 2019-01-13 NOTE — Telephone Encounter (Signed)
Pt's daughter called to schedule a colonoscopy as per recommendation of cardiologist due to blood in stool.  Pt had OV on 12/28/18.  Please advise.

## 2019-01-13 NOTE — Patient Instructions (Signed)
Medication Instructions:  Your physician recommends that you continue on your current medications as directed. Please refer to the Current Medication list given to you today.  *If you need a refill on your cardiac medications before your next appointment, please call your pharmacy*  Lab Work: Your physician recommends that you return for lab work today: CBC  If you have labs (blood work) drawn today and your tests are completely normal, you will receive your results only by: [name] MyChart Message (if you have MyChart) OR . A paper copy in the mail If you have any lab test that is abnormal or we need to change your treatment, we will call you to review the results.  Follow-Up: At [facility], you and your health needs are our priority.  As part of our continuing mission to provide you with exceptional heart care, we have created designated Provider Care Teams.  These Care Teams include your primary Cardiologist (physician) and Advanced Practice Providers (APPs -  Physician Assistants and Nurse Practitioners) who all work together to provide you with the care you need, when you need it.  Your next appointment:   2 months  The format for your next appointment:   In Person  Provider:   You may see Sanda Klein, MD or one of the following Advanced Practice Providers on your designated Care Team:    Almyra Deforest, PA-C  Fabian Sharp, [place] or   Roby Lofts, [place]   Other Instructions Please follow up with Dr. Loletha Carrow (Gastroenterologist).

## 2019-01-13 NOTE — Telephone Encounter (Signed)
I was unaware that his plavix had been held - that may have been done when he was hospitalized.   It can be resumed from my perspective if you feel it is strongly indicated.  His anemia and thrombocytopenia are due to his recurrent squamous esophageal malignancy.  I am not planning a colonoscopy or any endoscopic procedure for him at this juncture since the cancer is causing vocal cord dysfunction, greatly increasing the risks of sedation. So he does not need to see me in clinic right now.  His oncologist Benay Spice) and I have been in close communication about this, and they will surely contact me if needed.  Thanks for being concerned and thorough.     Wilfrid Lund, MD    [name]

## 2019-01-13 NOTE — Telephone Encounter (Signed)
-----   Message from Deberah Pelton, NP sent at 01/13/2019  2:41 PM EDT ----- Dr. Loletha Carrow, Mr.Cantave Plavix was held on 12/16/2018 in anticipation for an EGD which did not occur. I am concerned about his declining Hgb and blood in his stool. Can you have him scheduled in your first available appt. I have included my office note for you to review.  Thank you for your help.

## 2019-01-14 LAB — CBC
Hematocrit: 27.7 % — ABNORMAL LOW (ref 37.5–51.0)
Hemoglobin: 9 g/dL — ABNORMAL LOW (ref 13.0–17.7)
MCH: 31.4 pg (ref 26.6–33.0)
MCHC: 32.5 g/dL (ref 31.5–35.7)
MCV: 97 fL (ref 79–97)
Platelets: 65 10*3/uL — CL (ref 150–450)
RBC: 2.87 x10E6/uL — ABNORMAL LOW (ref 4.14–5.80)
RDW: 13.5 % (ref 11.6–15.4)
WBC: 2.6 10*3/uL — ABNORMAL LOW (ref 3.4–10.8)

## 2019-01-14 NOTE — Telephone Encounter (Signed)
[name], considering it has been > 12 months since last stent and that he has worsening anemia and thrombocytopenia, I think it is best for him to take ASA 81 mg only and just stop the clopidogrel permanently. Thanks for following up.

## 2019-01-18 ENCOUNTER — Telehealth: Payer: Self-pay | Admitting: Gastroenterology

## 2019-01-18 NOTE — Telephone Encounter (Signed)
Dr. Loletha Carrow, please advise when you'd think it would be appropriate for the patient to be scheduled with you for follow up. I can contact the patient and place a clinical reminder to schedule the patient.   I will call the patient and tell the patient your recommendations and proposed date for follow up and cancel his December office visit.

## 2019-01-18 NOTE — Telephone Encounter (Signed)
Set a clinical reminder for 4 months from now.  Hopefully that will be enough time for him to have completed his current round of cancer treatment.

## 2019-01-18 NOTE — Telephone Encounter (Signed)
FYI- Called and spoke with the patient who wanted to make another appointment with Dr. Loletha Carrow. It was very difficult to understand the patient over the phone due to the extreme hoarseness of his voice. He reported he'd been seen by Cardiology and Oncology and was told he needed to make another appointment to be seen by Dr. Loletha Carrow. The patient was scheduled next available on 02/22/2019 at 1:20 pm. The patient did not mention what was recorded by the scheduler below (treatment for blood in stool), he stated he was calling to follow up with Dr. Loletha Carrow.

## 2019-01-18 NOTE — Telephone Encounter (Signed)
The cardiology provider messaged me that they would be having him contact us.  I understand that [name] has seen blood in the stool intermittently for years.  However, that is not something that can currently be investigated, since the recurrence of the esophageal cancer affecting his vocal cord and upper airway absolutely preclude a colonoscopy for him at this juncture.  Depending on how he responds to the therapy, I do not know whether a colonoscopy will be feasible for him in the future.  As such, I do not think he needs to come see me in clinic soon, especially as he is in the midst of radiation treatment and we would like to try to decrease his exposure to healthcare environment as much as possible for infection risk.

## 2019-01-18 NOTE — Telephone Encounter (Signed)
Pt's daughter Hassan Rowan called inquiring about pt's plan of treatment for blood in stool. Pls call her

## 2019-01-19 ENCOUNTER — Other Ambulatory Visit: Payer: Medicare Other

## 2019-01-19 ENCOUNTER — Ambulatory Visit: Payer: Medicare Other | Admitting: Oncology

## 2019-01-19 NOTE — Telephone Encounter (Signed)
Spoke to the patient, notified patient of Dr. Loletha Carrow' recommendations, patient's 12/8 appt cancelled. Clinical reminder and staff message sent to [facility] RN to schedule the patient for an OV in 4 months.

## 2019-01-20 ENCOUNTER — Telehealth: Payer: Self-pay | Admitting: *Deleted

## 2019-01-20 ENCOUNTER — Inpatient Hospital Stay: Payer: Medicare Other | Attending: Oncology | Admitting: Nurse Practitioner

## 2019-01-20 ENCOUNTER — Other Ambulatory Visit: Payer: Self-pay

## 2019-01-20 ENCOUNTER — Encounter: Payer: Self-pay | Admitting: Nurse Practitioner

## 2019-01-20 VITALS — BP 149/64 | HR 68 | Temp 98.9°F | Resp 17 | Ht 67.0 in | Wt 189.6 lb

## 2019-01-20 DIAGNOSIS — R490 Dysphonia: Secondary | ICD-10-CM | POA: Insufficient documentation

## 2019-01-20 DIAGNOSIS — Z452 Encounter for adjustment and management of vascular access device: Secondary | ICD-10-CM | POA: Insufficient documentation

## 2019-01-20 DIAGNOSIS — R0789 Other chest pain: Secondary | ICD-10-CM | POA: Diagnosis not present

## 2019-01-20 DIAGNOSIS — C154 Malignant neoplasm of middle third of esophagus: Secondary | ICD-10-CM | POA: Diagnosis present

## 2019-01-20 DIAGNOSIS — Z950 Presence of cardiac pacemaker: Secondary | ICD-10-CM | POA: Insufficient documentation

## 2019-01-20 DIAGNOSIS — J449 Chronic obstructive pulmonary disease, unspecified: Secondary | ICD-10-CM | POA: Diagnosis not present

## 2019-01-20 DIAGNOSIS — I739 Peripheral vascular disease, unspecified: Secondary | ICD-10-CM | POA: Insufficient documentation

## 2019-01-20 DIAGNOSIS — Z5111 Encounter for antineoplastic chemotherapy: Secondary | ICD-10-CM | POA: Diagnosis present

## 2019-01-20 DIAGNOSIS — Z8546 Personal history of malignant neoplasm of prostate: Secondary | ICD-10-CM | POA: Diagnosis not present

## 2019-01-20 DIAGNOSIS — I2510 Atherosclerotic heart disease of native coronary artery without angina pectoris: Secondary | ICD-10-CM | POA: Insufficient documentation

## 2019-01-20 DIAGNOSIS — R1310 Dysphagia, unspecified: Secondary | ICD-10-CM | POA: Diagnosis not present

## 2019-01-20 DIAGNOSIS — Z7189 Other specified counseling: Secondary | ICD-10-CM | POA: Diagnosis not present

## 2019-01-20 MED ORDER — HYDROCODONE-ACETAMINOPHEN 7.5-325 MG/15ML PO SOLN: 5.0000 mL | Freq: Four times a day (QID) | ORAL | 0 refills | Status: DC | PRN

## 2019-01-20 MED ORDER — PROCHLORPERAZINE MALEATE 5 MG PO TABS: 5.0000 mg | ORAL_TABLET | Freq: Four times a day (QID) | ORAL | 2 refills | Status: DC | PRN

## 2019-01-20 MED ORDER — LIDOCAINE-PRILOCAINE 2.5-2.5 % EX CREA: TOPICAL_CREAM | CUTANEOUS | 2 refills | Status: AC

## 2019-01-20 NOTE — Progress Notes (Signed)
DISCONTINUE ON PATHWAY REGIMEN - Gastroesophageal     Administer weekly during RT:     Paclitaxel      Carboplatin   **Always confirm dose/schedule in your pharmacy ordering system**  REASON: Disease Progression PRIOR TREATMENT: [id number]: Carboplatin + Paclitaxel (2/50) Weekly (x 5-6 Weeks) with Concurrent RT TREATMENT RESPONSE: Complete Response (CR)  START ON PATHWAY REGIMEN - Gastroesophageal     A cycle is every 14 days:     Oxaliplatin      Leucovorin      Fluorouracil      Fluorouracil   **Always confirm dose/schedule in your pharmacy ordering system**  Patient Characteristics: Distant Metastases (cM1/pM1) / Locally Recurrent Disease, Squamous Cell, Esophageal & GE Junction, First Line, Prior Taxane or Taxane Contraindicated Histology: Squamous Cell Disease Classification: Esophageal Therapeutic Status: Distant Metastases (No Additional Staging) Line of Therapy: First Line Taxane Status: Prior Taxane Intent of Therapy: Non-Curative / Palliative Intent, Discussed with Patient

## 2019-01-20 NOTE — Progress Notes (Addendum)
[facility] OFFICE PROGRESS NOTE   Diagnosis: Esophagus cancer  INTERVAL HISTORY:   Mr. Nimmons returns as scheduled.  He continues to have issues with swallowing.  He notes pills get "stuck" intermittently.  He has persistent hoarseness. He has dyspnea with talking.  He continues to have intermittent left anterior chest pain.  Objective:  Vital signs in last 24 hours:  Blood pressure (!) 149/64, pulse 68, temperature 98.9 F (37.2 C), temperature source Temporal, resp. rate 17, height '5\' 7"'  (1.702 m), weight 189 lb 9.6 oz (86 kg), SpO2 99 %.    HEENT: Neck without mass. GI: Abdomen soft and nontender.  No hepatomegaly. Vascular: No leg edema. Neuro: Alert and oriented.  Follows commands.   Lab Results:  Lab Results  Component Value Date   WBC 2.6 (L) 01/13/2019   HGB 9.0 (L) 01/13/2019   HCT 27.7 (L) 01/13/2019   MCV 97 01/13/2019   PLT 65 (LL) 01/13/2019   NEUTROABS 1.5 (L) 01/10/2019    Imaging:  No results found.  Medications: I have reviewed the patient's current medications.  Assessment/Plan: 1. 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 Staging CTs 10/26/2017-asymmetric esophageal thickening, no evidence of metastatic disease ? Endoscopic biopsy 10/20/2017-mid esophagus partially obstructing mass, biopsy confirmed squamous cell carcinoma ? PET scan 11/02/2017-wall thickening in the mid esophagus SUV 27 MA: No findings specific for metastatic disease ? Radiation 11/09/2017-12/17/2017 ? Cycle 1 weekly Taxol/carboplatin 11/10/2017 ? Cycle 2 weekly Taxol/carboplatin 11/17/2017 (carboplatin held due to thrombocytopenia) ? Cycle 3 weekly Taxol/carboplatin 12/08/2017 (dosesreduced due to neutropenia, thrombocytopenia) ? Cycle 4 weekly Taxol/carboplatin 12/15/2017 ? Endoscopy 03/29/2018- no mass seen ? CT chest 12/29/2018-2 cm area associated with anterior wall the proximal esophagus, new-nonspecific, changes of cirrhosis ? PD-L1 CPS 5%  2. Solid dysphagia and odynophagia secondary to #1-improved 3. Coronary artery disease 4. History of prostate cancer 5. COPD 6. Permanent cardiac pacemaker 7. Peripheral vascular disease 8. Chronic thrombocytopenia 9. 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 CT chest 12/28/2017-no evidence of pulmonary emboli. Esophageal thickening consistent with patient's history of esophageal carcinoma. 11. Probable cirrhosis, referred to GI 12. Anemia-progressive compared to when he was here in June, potentially related to GI bleeding or epistaxis. 13. 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 Status post evaluation by [name], not a candidate for vocal fold injection at present.   Disposition: Mr. Chalk appears unchanged.  We reviewed the PD-L1 CPS score which indicates he is not a good candidate for immunotherapy.  Dr. Benay Spice recommends systemic therapy with FOLFOX chemotherapy.  We reviewed potential toxicities associated with chemotherapy including bone marrow toxicity, nausea, hair loss, allergic reaction.  We discussed the various forms of neuropathy associated with oxaliplatin including cold sensitivity, peripheral neuropathy, acute laryngopharyngeal dysesthesia and more rare occurrences such as diplopia, ataxia, incontinence.  We reviewed potential toxicities associated with 5-fluorouracil including mouth sores, diarrhea, hand-foot syndrome, increased sensitivity to sun, skin hyperpigmentation.  He would like to proceed with FOLFOX.  He understands this regimen requires a Port-A-Cath.  We made a referral to interventional radiology for Port-A-Cath placement.  Prescriptions were sent to his pharmacy for EMLA cream, Compazine and hydrocodone.  He understands he should not drive while taking pain medication.  We discussed a liquid/soft diet due to the dysphagia.  He will return  for lab, follow-up, cycle 1 FOLFOX on 01/31/2019.  He will contact the office in the interim with any problems.  Patient seen with Dr. Benay Spice.  40 minutes were spent face-to-face at today's visit with the majority of that time involved in counseling/coordination of care.  Ned Card ANP/GNP-BC   01/20/2019  2:33 PM This was a shared visit with Lattie Haw, we discussed treatment options with Mr.Lengel.  He understands an endoscopic biopsy would be required to establish a definitive diagnosis of his cancer.  The clinical presentation is consistent with progressive esophagus cancer and this diagnosis is very likely.  He does not wish to undergo a biopsy.  He does not have a high chance of responding to immunotherapy based on the CPS.  I recommend FOLFOX chemotherapy.  We reviewed potential toxicities associated with the FOLFOX regimen.  He agrees to proceed.  He understands the goal of chemotherapy is palliative with the hope of improving dysphagia limiting systemic progression, and potentially helping the hoarseness.  He will be referred for Port-A-Cath placement.  Julieanne Manson, MD

## 2019-01-20 NOTE — Telephone Encounter (Signed)
Per Ned Card NP, called pathology and talked with Maudie Mercury and requested Her-2 testing on biopsy done on pt on 10/20/17.

## 2019-01-21 ENCOUNTER — Telehealth: Payer: Self-pay | Admitting: *Deleted

## 2019-01-21 NOTE — Telephone Encounter (Signed)
Called Rhonda in Pathology and cancelled Her-2 testing.

## 2019-01-21 NOTE — Telephone Encounter (Signed)
-----  Message from Owens Shark, NP sent at 01/21/2019  9:12 AM EST ----- Please call pathology and cancel Her2 testing.

## 2019-01-24 ENCOUNTER — Telehealth: Payer: Self-pay | Admitting: Oncology

## 2019-01-24 NOTE — Telephone Encounter (Signed)
Scheduled per los. Called and spoke with patient. Confirmed appt 

## 2019-01-28 ENCOUNTER — Telehealth: Payer: Self-pay | Admitting: *Deleted

## 2019-01-28 ENCOUNTER — Telehealth: Payer: Self-pay | Admitting: Oncology

## 2019-01-28 NOTE — Telephone Encounter (Signed)
Called pt per 11/13 sch message - r/s appt from 11/16 to 11/23 -   Unable to reach pt . Left vmail with new apt date and time

## 2019-01-28 NOTE — Telephone Encounter (Signed)
Called to inform patient that chemotherapy will need to be rescheduled for 11/19 or 11/23 due to port placement not scheduled till 11/18. They understand and agree. High priority scheduling message sent. Dr. Benay Spice is aware.

## 2019-01-30 ENCOUNTER — Other Ambulatory Visit: Payer: Self-pay | Admitting: Oncology

## 2019-01-31 ENCOUNTER — Inpatient Hospital Stay: Payer: Medicare Other

## 2019-01-31 ENCOUNTER — Inpatient Hospital Stay: Payer: Medicare Other | Admitting: Oncology

## 2019-02-01 ENCOUNTER — Other Ambulatory Visit: Payer: Self-pay | Admitting: Radiology

## 2019-02-02 ENCOUNTER — Other Ambulatory Visit: Payer: Self-pay | Admitting: Nurse Practitioner

## 2019-02-02 ENCOUNTER — Other Ambulatory Visit: Payer: Self-pay

## 2019-02-02 ENCOUNTER — Ambulatory Visit (HOSPITAL_COMMUNITY)
Admission: RE | Admit: 2019-02-02 | Discharge: 2019-02-02 | Disposition: A | Discharge status: Home / Self Care | Payer: Medicare Other | Source: Ambulatory Visit | Attending: Oncology | Admitting: Oncology

## 2019-02-02 ENCOUNTER — Encounter (HOSPITAL_COMMUNITY): Payer: Self-pay | Admitting: Interventional Radiology

## 2019-02-02 ENCOUNTER — Ambulatory Visit (HOSPITAL_COMMUNITY)
Admission: RE | Admit: 2019-02-02 | Discharge: 2019-02-02 | Disposition: A | Discharge status: Home / Self Care | Payer: Medicare Other | Source: Ambulatory Visit | Attending: Nurse Practitioner | Admitting: Nurse Practitioner

## 2019-02-02 DIAGNOSIS — E785 Hyperlipidemia, unspecified: Secondary | ICD-10-CM | POA: Insufficient documentation

## 2019-02-02 DIAGNOSIS — I739 Peripheral vascular disease, unspecified: Secondary | ICD-10-CM | POA: Insufficient documentation

## 2019-02-02 DIAGNOSIS — I714 Abdominal aortic aneurysm, without rupture: Secondary | ICD-10-CM | POA: Insufficient documentation

## 2019-02-02 DIAGNOSIS — I252 Old myocardial infarction: Secondary | ICD-10-CM | POA: Insufficient documentation

## 2019-02-02 DIAGNOSIS — Z79899 Other long term (current) drug therapy: Secondary | ICD-10-CM | POA: Insufficient documentation

## 2019-02-02 DIAGNOSIS — G4733 Obstructive sleep apnea (adult) (pediatric): Secondary | ICD-10-CM | POA: Diagnosis not present

## 2019-02-02 DIAGNOSIS — C154 Malignant neoplasm of middle third of esophagus: Secondary | ICD-10-CM

## 2019-02-02 DIAGNOSIS — Z7982 Long term (current) use of aspirin: Secondary | ICD-10-CM | POA: Insufficient documentation

## 2019-02-02 DIAGNOSIS — K219 Gastro-esophageal reflux disease without esophagitis: Secondary | ICD-10-CM | POA: Diagnosis not present

## 2019-02-02 DIAGNOSIS — I480 Paroxysmal atrial fibrillation: Secondary | ICD-10-CM | POA: Diagnosis not present

## 2019-02-02 DIAGNOSIS — I10 Essential (primary) hypertension: Secondary | ICD-10-CM | POA: Diagnosis not present

## 2019-02-02 DIAGNOSIS — I2510 Atherosclerotic heart disease of native coronary artery without angina pectoris: Secondary | ICD-10-CM | POA: Insufficient documentation

## 2019-02-02 DIAGNOSIS — I495 Sick sinus syndrome: Secondary | ICD-10-CM | POA: Diagnosis not present

## 2019-02-02 DIAGNOSIS — J449 Chronic obstructive pulmonary disease, unspecified: Secondary | ICD-10-CM | POA: Diagnosis not present

## 2019-02-02 HISTORY — PX: IR IMAGING GUIDED PORT INSERTION: IMG5740

## 2019-02-02 LAB — CBC WITH DIFFERENTIAL/PLATELET
Abs Immature Granulocytes: 0.01 10*3/uL (ref 0.00–0.07)
Basophils Absolute: 0 10*3/uL (ref 0.0–0.1)
Basophils Relative: 0 %
Eosinophils Absolute: 0.1 10*3/uL (ref 0.0–0.5)
Eosinophils Relative: 3 %
HCT: 32.5 % — ABNORMAL LOW (ref 39.0–52.0)
Hemoglobin: 10.4 g/dL — ABNORMAL LOW (ref 13.0–17.0)
Immature Granulocytes: 0 %
Lymphocytes Relative: 22 %
Lymphs Abs: 0.7 10*3/uL (ref 0.7–4.0)
MCH: 30.8 pg (ref 26.0–34.0)
MCHC: 32 g/dL (ref 30.0–36.0)
MCV: 96.2 fL (ref 80.0–100.0)
Monocytes Absolute: 0.5 10*3/uL (ref 0.1–1.0)
Monocytes Relative: 14 %
Neutro Abs: 2 10*3/uL (ref 1.7–7.7)
Neutrophils Relative %: 61 %
Platelets: 90 10*3/uL — ABNORMAL LOW (ref 150–400)
RBC: 3.38 MIL/uL — ABNORMAL LOW (ref 4.22–5.81)
RDW: 13.6 % (ref 11.5–15.5)
WBC: 3.3 10*3/uL — ABNORMAL LOW (ref 4.0–10.5)
nRBC: 0 % (ref 0.0–0.2)

## 2019-02-02 LAB — PROTIME-INR
INR: 1.1 (ref 0.8–1.2)
Prothrombin Time: 13.8 seconds (ref 11.4–15.2)

## 2019-02-02 LAB — BASIC METABOLIC PANEL
Anion gap: 9 (ref 5–15)
BUN: 23 mg/dL (ref 8–23)
CO2: 20 mmol/L — ABNORMAL LOW (ref 22–32)
Calcium: 9.2 mg/dL (ref 8.9–10.3)
Chloride: 109 mmol/L (ref 98–111)
Creatinine, Ser: 0.99 mg/dL (ref 0.61–1.24)
GFR calc Af Amer: >60 mL/min (ref >60)
GFR calc non Af Amer: >60 mL/min (ref >60)
Glucose, Bld: 94 mg/dL (ref 70–99)
Potassium: 4.1 mmol/L (ref 3.5–5.1)
Sodium: 138 mmol/L (ref 135–145)

## 2019-02-02 MED ORDER — MIDAZOLAM HCL 2 MG/2ML IJ SOLN
INTRAMUSCULAR | Status: AC | PRN
  Administered 2019-02-02 (×2): 1 mg via INTRAVENOUS

## 2019-02-02 MED ORDER — FENTANYL CITRATE (PF) 100 MCG/2ML IJ SOLN
INTRAMUSCULAR | Status: AC
  Filled 2019-02-02: qty 2

## 2019-02-02 MED ORDER — LIDOCAINE-EPINEPHRINE 1 %-1:100000 IJ SOLN
INTRAMUSCULAR | Status: AC
  Filled 2019-02-02: qty 1

## 2019-02-02 MED ORDER — LIDOCAINE-EPINEPHRINE (PF) 1 %-1:200000 IJ SOLN
INTRAMUSCULAR | Status: AC | PRN
  Administered 2019-02-02: 5 mL

## 2019-02-02 MED ORDER — CEFAZOLIN SODIUM-DEXTROSE 2-4 GM/100ML-% IV SOLN
INTRAVENOUS | Status: AC
  Administered 2019-02-02: 2 g via INTRAVENOUS
  Filled 2019-02-02: qty 100

## 2019-02-02 MED ORDER — HEPARIN SOD (PORK) LOCK FLUSH 100 UNIT/ML IV SOLN
INTRAVENOUS | Status: AC
  Filled 2019-02-02: qty 5

## 2019-02-02 MED ORDER — CEFAZOLIN SODIUM-DEXTROSE 2-4 GM/100ML-% IV SOLN
2.0000 g | INTRAVENOUS | Status: AC
  Administered 2019-02-02: 10:00:00 2 g via INTRAVENOUS

## 2019-02-02 MED ORDER — MIDAZOLAM HCL 2 MG/2ML IJ SOLN
INTRAMUSCULAR | Status: AC
  Filled 2019-02-02: qty 2

## 2019-02-02 MED ORDER — FENTANYL CITRATE (PF) 100 MCG/2ML IJ SOLN
INTRAMUSCULAR | Status: AC | PRN
  Administered 2019-02-02: 50 ug via INTRAVENOUS

## 2019-02-02 MED ORDER — LIDOCAINE-EPINEPHRINE (PF) 1 %-1:200000 IJ SOLN
INTRAMUSCULAR | Status: AC | PRN
  Administered 2019-02-02: 10 mL

## 2019-02-02 MED ORDER — SODIUM CHLORIDE 0.9 % IV SOLN
INTRAVENOUS | Status: DC
  Administered 2019-02-02: 08:00:00 via INTRAVENOUS

## 2019-02-02 NOTE — Consult Note (Signed)
Chief Complaint: Patient was seen in consultation today for Port-A-Cath placement  Referring Physician(s): Sherrill,B  Supervising Physician: Sandi Mariscal  Patient Status: [facility] - Out-pt  History of Present Illness: Jailynn Muffler. is an 84 y.o. male with history of progressive squamous cell carcinoma of the esophagus, initially diagnosed in 2019, who presents today for Port-A-Cath placement for palliative chemotherapy.  Additional medical history as below.   Past Medical History:  Diagnosis Date  . AAA (abdominal aortic aneurysm) ([place])    a. Last duplex - 3.8cm 01/2013 - due 01/2014.  [name] Acne rosacea   . Alcoholic hepatitis with ascites 08/28/2017  . Anginal pain ([place]) 1996  . Aortic stenosis, mild    Last echo 05/04/12 +LVH  . Arthritis    "shoulders" (09/01/2017)  . Back pain    hx epidural injections  . Baker's cyst    Lt.  [name] CAD (coronary artery disease)    a. s/p MI/PTCA - Cx 1994. b. s/p CABG x5 in 1997. c. Abnl nuc 2003- occ SVG-RCA, patent seq SVG-OM1-dCxOM, patentl LIMA-LAD, patent, SVG-diag. d. Normal nuc 09/2012.  . Carotid artery disease ([place])    a. Duplex 01/2013: mildly abnormal. Mild plaque without significant diameter reduction. Repeat recommended 11/15.  [name] COPD (chronic obstructive pulmonary disease) ([place])   . Dizziness and giddiness 08/04/2013  . GERD (gastroesophageal reflux disease)   . H/O: GI bleed    mild, neg. colonoscopy  . Heart murmur   . HOH (hard of hearing)   . HTN (hypertension)   . Hyperlipidemia   . Hypertensive heart disease   . Myocardial infarction ([place]) 1994  . OSA on CPAP   . OSA treated with BiPAP 11/20/2015   Severe with [facility] 38/hr  . PAF (paroxysmal atrial fibrillation) ([place])    a. Remote per records.   . Pneumonia    history  . Presence of permanent cardiac pacemaker    DOI 1999 with change out 2006; St Jude  . Prostate cancer ([place]) ~ 2010   a. s/p radical retropubic prostatectomy with bilateral pelvic lymph node dissection   . PVD (peripheral vascular disease) ([place])    a. Rt ext iliac stenosis, last PV cath 2003, moderate stenosis last Lower Ext Dopplers 12/16/11 - Rt. ABI 0.96  Lt ABI 1.0.  . Sick sinus syndrome ([place])    a. placed 1999, gen change 2011 - St. Jude.  . Sinus node dysfunction ([place]) 01/20/2013  . Syncope 11/28/97  . Thrombocytopenia ([facility])    chronic, mild  . Valvular heart disease    a. Echo 04/2012: mild-mod AS, mild AI, mild MR.    Past Surgical History:  Procedure Laterality Date  . APPENDECTOMY    . BIOPSY  10/20/2017   Procedure: BIOPSY;  Surgeon: Doran Stabler, MD;  Location: [facility];  Service: Gastroenterology;;  . CARDIAC CATHETERIZATION  2003   VG to RCA occluded, other grafts patent  . CARDIAC CATHETERIZATION N/A 05/04/2015   Procedure: Right/Left Heart Cath and Coronary/Graft Angiography;  Surgeon: Sherren Mocha, MD;  Location: [place] CV LAB;  Service: Cardiovascular;  Laterality: N/A;  . CARDIAC CATHETERIZATION  09/01/2017  . CARDIAC VALVE REPLACEMENT    . CATARACT EXTRACTION W/ INTRAOCULAR LENS  IMPLANT, BILATERAL Bilateral   . COLONOSCOPY    . CORONARY ANGIOPLASTY  03/29/92   PTCA to LCX  . CORONARY ARTERY BYPASS GRAFT  1997   LIMA-LAD; VG-diag; seq VG-1st OM & distal LCX; VG-RCA  . CORONARY ATHERECTOMY N/A 09/01/2017  Procedure: CORONARY ATHERECTOMY;  Surgeon: Jettie Booze, MD;  Location: [place] [facility];  Service: Cardiovascular;  Laterality: N/A;  . CORONARY ATHERECTOMY  10/01/2017  . CORONARY ATHERECTOMY N/A 10/01/2017   Procedure: CORONARY ATHERECTOMY;  Surgeon: Leonie Man, MD;  Location: [facility];  Service: Cardiovascular;  Laterality: N/A;  . ESOPHAGOGASTRODUODENOSCOPY (EGD) WITH PROPOFOL N/A 10/20/2017   Procedure: ESOPHAGOGASTRODUODENOSCOPY (EGD) WITH PROPOFOL;  Surgeon: Doran Stabler, MD;  Location: [facility];  Service: Gastroenterology;  Laterality: N/A;  . ESOPHAGOGASTRODUODENOSCOPY (EGD) WITH PROPOFOL N/A 03/29/2018    Procedure: ESOPHAGOGASTRODUODENOSCOPY (EGD) WITH PROPOFOL;  Surgeon: Doran Stabler, MD;  Location: [facility];  Service: Gastroenterology;  Laterality: N/A;  . INSERT / REPLACE / REMOVE PACEMAKER  11/28/1997   pacesetter--ERI 2011  . LEFT HEART CATH N/A 09/01/2017   Procedure: Left Heart Cath;  Surgeon: Jettie Booze, MD;  Location: [place] [facility];  Service: Cardiovascular;  Laterality: N/A;  . LEFT HEART CATH AND CORS/GRAFTS ANGIOGRAPHY N/A 08/28/2017   Procedure: LEFT HEART CATH AND CORS/GRAFTS ANGIOGRAPHY;  Surgeon: Leonie Man, MD;  Location: [place] [facility] LAB;  Service: Cardiovascular;  Laterality: N/A;  . PACEMAKER GENERATOR CHANGE  08/15/2009   St. Jude accent  . SUPRAPUBIC PROSTATECTOMY    . TEE WITHOUT CARDIOVERSION N/A 06/12/2015   Procedure: TRANSESOPHAGEAL ECHOCARDIOGRAM (TEE);  Surgeon: Sherren Mocha, MD;  Location: [place];  Service: Open Heart Surgery;  Laterality: N/A;  . TONSILLECTOMY    . TRANSCATHETER AORTIC VALVE REPLACEMENT, TRANSFEMORAL N/A 06/12/2015   Procedure: TRANSCATHETER AORTIC VALVE REPLACEMENT, TRANSFEMORAL, possible transpical;  Surgeon: Sherren Mocha, MD;  Location: [place] [facility];  Service: Open Heart Surgery;  Laterality: N/A;    Allergies: Patient has no known allergies.  Medications: Prior to Admission medications   Medication Sig Start Date End Date Taking? Authorizing Provider  acetaminophen (TYLENOL) 325 MG tablet Take 650 mg by mouth 2 (two) times daily.   Yes [provider]  aspirin EC 81 MG EC tablet Take 1 tablet (81 mg total) by mouth daily. 12/17/18  Yes Cheryln Manly, NP  atorvastatin (LIPITOR) 40 MG tablet Take 1 tablet (40 mg total) by mouth daily at 6 PM. 08/28/17  Yes Kayleen Memos, DO  HYDROcodone-acetaminophen (HYCET) 7.5-325 mg/15 ml solution Take 5-10 mLs by mouth every 6 (six) hours as needed for moderate pain. 01/20/19  Yes Owens Shark, NP  lidocaine-prilocaine (EMLA) cream Apply to port site one hour prior  to use 01/20/19  Yes Owens Shark, NP  losartan (COZAAR) 25 MG tablet TAKE 1 TABLET BY MOUTH DAILY Patient taking differently: Take 25 mg by mouth at bedtime.  12/08/18  Yes Daune Perch, NP  metoprolol tartrate (LOPRESSOR) 25 MG tablet TAKE 1 TABLET BY MOUTH TWICE DAILY Patient taking differently: Take 25 mg by mouth 2 (two) times daily.  05/05/18  Yes Baldwin Jamaica, PA-C  nitroGLYCERIN (NITROSTAT) 0.4 MG SL tablet PLACE 1 TABLET UNDER THE TONGUE EVERY 5 MINUTES AS NEEDED FOR CHEST PAIN UP TO 3 DOSES, IF SYMPTOMS PERSIST CALL 911 01/05/19  Yes Lyda Jester M, PA-C  Omega-3 Fatty Acids (FISH OIL) 1200 MG CAPS Take 1,200 mg by mouth every evening.   Yes [provider]  pantoprazole (PROTONIX) 40 MG tablet Take 1 tablet (40 mg total) by mouth daily. 04/05/18 04/05/19 Yes Turner, Eber Hong, MD  NON FORMULARY Apply 1 application topically See admin instructions. Tridema pain cream: Rub into both shoulders 2 times a  day    [provider]  prochlorperazine (COMPAZINE) 5 MG tablet Take 1 tablet (5 mg total) by mouth every 6 (six) hours as needed for nausea or vomiting. 01/20/19   Owens Shark, NP     Family History  Problem Relation Age of Onset  . Colon cancer Father   . Cancer Brother   . Heart Problems Brother   . Heart Problems Sister   . CAD Other   . Stomach cancer Neg Hx   . Pancreatic cancer Neg Hx   . Esophageal cancer Neg Hx     Social History   Socioeconomic History  . Marital status: Widowed    Spouse name: Not on file  . Number of children: 5  . Years of education: 7th  . Highest education level: Not on file  Occupational History  . Occupation: Retired  Scientific laboratory technician  . Financial resource strain: Not on file  . Food insecurity    Worry: Not on file    Inability: Not on file  . Transportation needs    Medical: Not on file    Non-medical: Not on file  Tobacco Use  . Smoking status: Former Smoker    Packs/day: 2.00    Years: 48.00    Pack  years: 96.00    Types: Cigarettes    Quit date: 03/18/1995    Years since quitting: 23.8  . Smokeless tobacco: Never Used  Substance and Sexual Activity  . Alcohol use: No    Comment: 09/01/2017 "nothing in the last couple years"  . Drug use: Never  . Sexual activity: Not Currently  Lifestyle  . Physical activity    Days per week: Not on file    Minutes per session: Not on file  . Stress: Not on file  Relationships  . Social Herbalist on phone: Not on file    Gets together: Not on file    Attends religious service: Not on file    Active member of club or organization: Not on file    Attends meetings of clubs or organizations: Not on file    Relationship status: Not on file  Other Topics Concern  . Not on file  Social History Narrative  . Not on file     Review of Systems he currently denies fever, headache, chest pain, dyspnea, cough, abdominal/back pain, nausea, vomiting or bleeding.  He does have hoarseness.  Vital Signs: BP (!) 155/87 (BP Location: Right Arm)   Pulse 70   Temp 98.4 F (36.9 C) (Oral)   Resp 18   Ht 5\' 7"  (1.702 m)   Wt 180 lb (81.6 kg)   SpO2 99%   BMI 28.19 kg/m   Physical Exam awake, alert.  Chest clear to auscultation bilaterally.  Right chest wall pacer in place.  Heart with regular rate and rhythm.  Abdomen soft, positive bowel sounds, nontender.  No lower extremity edema.  Imaging: No results found.  Labs:  CBC: Recent Labs    12/15/18 1547 12/20/18 1317 01/10/19 1047 01/13/19 1425  WBC 2.7* 2.4* 2.4* 2.6*  HGB 10.6* 9.8* 9.3* 9.0*  HCT 31.3* 29.3* 28.3* 27.7*  PLT 79* 62* 70* 65*    COAGS: Recent Labs    03/02/18 1536  INR 1.2*    BMP: Recent Labs    11/08/18 0910 12/15/18 1547  NA 142 140  K 4.0 4.1  CL 109* 110  CO2 21 21*  GLUCOSE 92 135*  BUN 22 19  CALCIUM 8.6 8.8*  CREATININE 1.15 1.08  GFRNONAA 59* >60  GFRAA 68 >60    LIVER FUNCTION TESTS: Recent Labs    11/08/18 0910  BILITOT 0.6   AST 25  ALT 13  ALKPHOS 113  PROT 6.0  ALBUMIN 3.5*    TUMOR MARKERS: No results for input(s): AFPTM, CEA, CA199, CHROMGRNA in the last 8760 hours.  Assessment and Plan:  84 y.o. male with history of progressive squamous cell carcinoma of the esophagus, initially diagnosed in 2019, who presents today for Port-A-Cath placement for palliative chemotherapy. Risks and benefits of image guided port-a-catheter placement was discussed with the patient including, but not limited to bleeding, infection, pneumothorax, or fibrin sheath development and need for additional procedures.  All of the patient's questions were answered, patient is agreeable to proceed. Consent signed and in chart.  LABS PENDING   Thank you for this interesting consult.  I greatly enjoyed meeting [facility]. and look forward to participating in their care.  A copy of this report was sent to the requesting provider on this date.  Electronically Signed: D. Rowe Robert, PA-C 02/02/2019, 8:40 AM   I spent a total of 25 minutes  in face to face in clinical consultation, greater than 50% of which was counseling/coordinating care for Port-A-Cath placement

## 2019-02-02 NOTE — Procedures (Signed)
Pre Procedure Dx: Poor venous access Post Procedural Dx: Same  Successful placement of left IJ approach port-a-cath with tip at the superior caval atrial junction. The catheter is ready for immediate use.  Estimated Blood Loss: Minimal  Complications: None immediate.  Jay Jorge Retz, MD Pager #: [phone]   

## 2019-02-02 NOTE — Discharge Instructions (Signed)
Implanted Port Insertion, Care After °This sheet gives you information about how to care for yourself after your procedure. Your health care provider may also give you more specific instructions. If you have problems or questions, contact your health care provider. °What can I expect after the procedure? °After the procedure, it is common to have: °· Discomfort at the port insertion site. °· Bruising on the skin over the port. This should improve over 3-4 days. °Follow these instructions at home: °Port care °· After your port is placed, you will get a manufacturer's information card. The card has information about your port. Keep this card with you at all times. °· Take care of the port as told by your health care provider. Ask your health care provider if you or a family member can get training for taking care of the port at home. A home health care nurse may also take care of the port. °· Make sure to remember what type of port you have. °Incision care ° °  ° °· Follow instructions from your health care provider about how to take care of your port insertion site. Make sure you: °? Wash your hands with soap and water before and after you change your bandage (dressing). If soap and water are not available, use hand sanitizer. °? Change your dressing as told by your health care provider. °? Leave stitches (sutures), skin glue, or adhesive strips in place. These skin closures may need to stay in place for 2 weeks or longer. If adhesive strip edges start to loosen and curl up, you may trim the loose edges. Do not remove adhesive strips completely unless your health care provider tells you to do that. °· Check your port insertion site every day for signs of infection. Check for: °? Redness, swelling, or pain. °? Fluid or blood. °? Warmth. °? Pus or a bad smell. °Activity °· Return to your normal activities as told by your health care provider. Ask your health care provider what activities are safe for you. °· Do not  lift anything that is heavier than 10 lb (4.5 kg), or the limit that you are told, until your health care provider says that it is safe. °General instructions °· Take over-the-counter and prescription medicines only as told by your health care provider. °· Do not take baths, swim, or use a hot tub until your health care provider approves. Ask your health care provider if you may take showers. You may only be allowed to take sponge baths. °· Do not drive for 24 hours if you were given a sedative during your procedure. °· Wear a medical alert bracelet in case of an emergency. This will tell any health care providers that you have a port. °· Keep all follow-up visits as told by your health care provider. This is important. °Contact a health care provider if: °· You cannot flush your port with saline as directed, or you cannot draw blood from the port. °· You have a fever or chills. °· You have redness, swelling, or pain around your port insertion site. °· You have fluid or blood coming from your port insertion site. °· Your port insertion site feels warm to the touch. °· You have pus or a bad smell coming from the port insertion site. °Get help right away if: °· You have chest pain or shortness of breath. °· You have bleeding from your port that you cannot control. °Summary °· Take care of the port as told by your health   care provider. Keep the manufacturer's information card with you at all times. °· Change your dressing as told by your health care provider. °· Contact a health care provider if you have a fever or chills or if you have redness, swelling, or pain around your port insertion site. °· Keep all follow-up visits as told by your health care provider. °This information is not intended to replace advice given to you by your health care provider. Make sure you discuss any questions you have with your health care provider. °Document Released: 12/22/2012 Document Revised: 09/29/2017 Document Reviewed:  09/29/2017 °Elsevier Patient Education © 2020 [employer] Inc[employer] ° °Moderate Conscious Sedation, Adult, Care After °These instructions provide you with information about caring for yourself after your procedure. Your health care provider may also give you more specific instructions. Your treatment has been planned according to current medical practices, but problems sometimes occur. Call your health care provider if you have any problems or questions after your procedure. °What can I expect after the procedure? °After your procedure, it is common: °· To feel sleepy for several hours. °· To feel clumsy and have poor balance for several hours. °· To have poor judgment for several hours. °· To vomit if you eat too soon. °Follow these instructions at home: °For at least 24 hours after the procedure: ° °· Do not: °? Participate in activities where you could fall or become injured. °? Drive. °? Use heavy machinery. °? Drink alcohol. °? Take sleeping pills or medicines that cause drowsiness. °? Make important decisions or sign legal documents. °? Take care of children on your own. °· Rest. °Eating and drinking °· Follow the diet recommended by your health care provider. °· If you vomit: °? Drink water, juice, or soup when you can drink without vomiting. °? Make sure you have little or no nausea before eating solid foods. °General instructions °· Have a responsible adult stay with you until you are awake and alert. °· Take over-the-counter and prescription medicines only as told by your health care provider. °· If you smoke, do not smoke without supervision. °· Keep all follow-up visits as told by your health care provider. This is important. °Contact a health care provider if: °· You keep feeling nauseous or you keep vomiting. °· You feel light-headed. °· You develop a rash. °· You have a fever. °Get help right away if: °· You have trouble breathing. °This information is not intended to replace advice given to you by your  health care provider. Make sure you discuss any questions you have with your health care provider. °Document Released: 12/22/2012 Document Revised: 02/13/2017 Document Reviewed: 06/23/2015 °Elsevier Patient Education © 2020 [employer] Inc. ° °

## 2019-02-07 ENCOUNTER — Other Ambulatory Visit: Payer: Self-pay

## 2019-02-07 ENCOUNTER — Telehealth: Payer: Self-pay | Admitting: Oncology

## 2019-02-07 ENCOUNTER — Inpatient Hospital Stay (HOSPITAL_BASED_OUTPATIENT_CLINIC_OR_DEPARTMENT_OTHER): Payer: Medicare Other | Admitting: Oncology

## 2019-02-07 ENCOUNTER — Inpatient Hospital Stay: Payer: Medicare Other

## 2019-02-07 VITALS — BP 148/54 | HR 75 | Temp 98.5°F | Resp 17 | Ht 67.0 in | Wt 187.3 lb

## 2019-02-07 DIAGNOSIS — Z5111 Encounter for antineoplastic chemotherapy: Secondary | ICD-10-CM | POA: Diagnosis not present

## 2019-02-07 DIAGNOSIS — C159 Malignant neoplasm of esophagus, unspecified: Secondary | ICD-10-CM

## 2019-02-07 DIAGNOSIS — C154 Malignant neoplasm of middle third of esophagus: Secondary | ICD-10-CM

## 2019-02-07 DIAGNOSIS — Z95828 Presence of other vascular implants and grafts: Secondary | ICD-10-CM

## 2019-02-07 LAB — CBC WITH DIFFERENTIAL (CANCER CENTER ONLY)
Abs Immature Granulocytes: 0.01 10*3/uL (ref 0.00–0.07)
Basophils Absolute: 0 10*3/uL (ref 0.0–0.1)
Basophils Relative: 1 %
Eosinophils Absolute: 0.1 10*3/uL (ref 0.0–0.5)
Eosinophils Relative: 3 %
HCT: 26.9 % — ABNORMAL LOW (ref 39.0–52.0)
Hemoglobin: 8.9 g/dL — ABNORMAL LOW (ref 13.0–17.0)
Immature Granulocytes: 1 %
Lymphocytes Relative: 20 %
Lymphs Abs: 0.4 10*3/uL — ABNORMAL LOW (ref 0.7–4.0)
MCH: 30.5 pg (ref 26.0–34.0)
MCHC: 33.1 g/dL (ref 30.0–36.0)
MCV: 92.1 fL (ref 80.0–100.0)
Monocytes Absolute: 0.3 10*3/uL (ref 0.1–1.0)
Monocytes Relative: 14 %
Neutro Abs: 1.3 10*3/uL — ABNORMAL LOW (ref 1.7–7.7)
Neutrophils Relative %: 61 %
Platelet Count: 66 10*3/uL — ABNORMAL LOW (ref 150–400)
RBC: 2.92 MIL/uL — ABNORMAL LOW (ref 4.22–5.81)
RDW: 13.4 % (ref 11.5–15.5)
WBC Count: 2.1 10*3/uL — ABNORMAL LOW (ref 4.0–10.5)
nRBC: 0 % (ref 0.0–0.2)

## 2019-02-07 LAB — CMP (CANCER CENTER ONLY)
ALT: 18 U/L (ref 0–44)
AST: 33 U/L (ref 15–41)
Albumin: 3.1 g/dL — ABNORMAL LOW (ref 3.5–5.0)
Alkaline Phosphatase: 126 U/L (ref 38–126)
Anion gap: 9 (ref 5–15)
BUN: 24 mg/dL — ABNORMAL HIGH (ref 8–23)
CO2: 20 mmol/L — ABNORMAL LOW (ref 22–32)
Calcium: 8.6 mg/dL — ABNORMAL LOW (ref 8.9–10.3)
Chloride: 112 mmol/L — ABNORMAL HIGH (ref 98–111)
Creatinine: 1.12 mg/dL (ref 0.61–1.24)
GFR, Est AFR Am: >60 mL/min (ref >60)
GFR, Estimated: >60 mL/min (ref >60)
Glucose, Bld: 197 mg/dL — ABNORMAL HIGH (ref 70–99)
Potassium: 4 mmol/L (ref 3.5–5.1)
Sodium: 141 mmol/L (ref 135–145)
Total Bilirubin: 0.6 mg/dL (ref 0.3–1.2)
Total Protein: 6.6 g/dL (ref 6.5–8.1)

## 2019-02-07 LAB — CEA (IN HOUSE-CHCC): CEA (CHCC-In House): 2.08 ng/mL (ref 0.00–5.00)

## 2019-02-07 MED ORDER — DEXAMETHASONE SODIUM PHOSPHATE 10 MG/ML IJ SOLN
INTRAMUSCULAR | Status: AC
  Filled 2019-02-07: qty 1

## 2019-02-07 MED ORDER — HEPARIN SOD (PORK) LOCK FLUSH 100 UNIT/ML IV SOLN
500.0000 [IU] | Freq: Once | INTRAVENOUS | Status: DC | PRN
  Filled 2019-02-07: qty 5

## 2019-02-07 MED ORDER — SODIUM CHLORIDE 0.9 % IV SOLN
2000.0000 mg/m2 | INTRAVENOUS | Status: DC
  Administered 2019-02-07: 13:00:00 4050 mg via INTRAVENOUS
  Filled 2019-02-07: qty 81

## 2019-02-07 MED ORDER — SODIUM CHLORIDE 0.9% FLUSH
10.0000 mL | INTRAVENOUS | Status: DC | PRN
  Filled 2019-02-07: qty 10

## 2019-02-07 MED ORDER — DEXAMETHASONE SODIUM PHOSPHATE 10 MG/ML IJ SOLN
10.0000 mg | Freq: Once | INTRAMUSCULAR | Status: AC
  Administered 2019-02-07: 10 mg via INTRAVENOUS

## 2019-02-07 MED ORDER — DEXTROSE 5 % IV SOLN
Freq: Once | INTRAVENOUS | Status: AC
  Administered 2019-02-07: 10:00:00 via INTRAVENOUS
  Filled 2019-02-07: qty 250

## 2019-02-07 MED ORDER — OXALIPLATIN CHEMO INJECTION 100 MG/20ML
50.0000 mg/m2 | Freq: Once | INTRAVENOUS | Status: AC
  Administered 2019-02-07: 100 mg via INTRAVENOUS
  Filled 2019-02-07: qty 20

## 2019-02-07 MED ORDER — PALONOSETRON HCL INJECTION 0.25 MG/5ML
0.2500 mg | Freq: Once | INTRAVENOUS | Status: AC
  Administered 2019-02-07: 10:00:00 0.25 mg via INTRAVENOUS

## 2019-02-07 MED ORDER — LEUCOVORIN CALCIUM INJECTION 350 MG
300.0000 mg/m2 | Freq: Once | INTRAVENOUS | Status: AC
  Administered 2019-02-07: 606 mg via INTRAVENOUS
  Filled 2019-02-07: qty 30.3

## 2019-02-07 MED ORDER — PALONOSETRON HCL INJECTION 0.25 MG/5ML
INTRAVENOUS | Status: AC
  Filled 2019-02-07: qty 5

## 2019-02-07 MED ORDER — SODIUM CHLORIDE 0.9% FLUSH
10.0000 mL | INTRAVENOUS | Status: DC | PRN
  Administered 2019-02-07: 09:00:00 10 mL via INTRAVENOUS
  Filled 2019-02-07: qty 10

## 2019-02-07 NOTE — Patient Instructions (Addendum)
[facility] Discharge Instructions for Patients Receiving Chemotherapy  Today you received the following chemotherapy agents: Oxaliplatin/Leucovorin/Fluorouracil  To help prevent nausea and vomiting after your treatment, we encourage you to take your nausea medication as directed.   If you develop nausea and vomiting that is not controlled by your nausea medication, call the clinic.   BELOW ARE SYMPTOMS THAT SHOULD BE REPORTED IMMEDIATELY:  *FEVER GREATER THAN 100.5 F  *CHILLS WITH OR WITHOUT FEVER  NAUSEA AND VOMITING THAT IS NOT CONTROLLED WITH YOUR NAUSEA MEDICATION  *UNUSUAL SHORTNESS OF BREATH  *UNUSUAL BRUISING OR BLEEDING  TENDERNESS IN MOUTH AND THROAT WITH OR WITHOUT PRESENCE OF ULCERS  *URINARY PROBLEMS  *BOWEL PROBLEMS  UNUSUAL RASH Items with * indicate a potential emergency and should be followed up as soon as possible.  Feel free to call the clinic should you have any questions or concerns. The clinic phone number is [phone].  Please show the [facility] at check-in to the Emergency Department and triage nurse.  Oxaliplatin Injection What is this medicine? OXALIPLATIN (ox AL i PLA tin) is a chemotherapy drug. It targets fast dividing cells, like cancer cells, and causes these cells to die. This medicine is used to treat cancers of the colon and rectum, and many other cancers. This medicine may be used for other purposes; ask your health care provider or pharmacist if you have questions. COMMON BRAND NAME(S): Eloxatin What should I tell my health care provider before I take this medicine? They need to know if you have any of these conditions:  kidney disease  an unusual or allergic reaction to oxaliplatin, other chemotherapy, other medicines, foods, dyes, or preservatives  pregnant or trying to get pregnant  breast-feeding How should I use this medicine? This drug is given as an infusion into a vein. It is administered in a  hospital or clinic by a specially trained health care professional. Talk to your pediatrician regarding the use of this medicine in children. Special care may be needed. Overdosage: If you think you have taken too much of this medicine contact a poison control center or emergency room at once. NOTE: This medicine is only for you. Do not share this medicine with others. What if I miss a dose? It is important not to miss a dose. Call your doctor or health care professional if you are unable to keep an appointment. What may interact with this medicine?  medicines to increase blood counts like filgrastim, pegfilgrastim, sargramostim  probenecid  some antibiotics like amikacin, gentamicin, neomycin, polymyxin B, streptomycin, tobramycin  zalcitabine Talk to your doctor or health care professional before taking any of these medicines:  acetaminophen  aspirin  ibuprofen  ketoprofen  naproxen This list may not describe all possible interactions. Give your health care provider a list of all the medicines, herbs, non-prescription drugs, or dietary supplements you use. Also tell them if you smoke, drink alcohol, or use illegal drugs. Some items may interact with your medicine. What should I watch for while using this medicine? Your condition will be monitored carefully while you are receiving this medicine. You will need important blood work done while you are taking this medicine. This medicine can make you more sensitive to cold. Do not drink cold drinks or use ice. Cover exposed skin before coming in contact with cold temperatures or cold objects. When out in cold weather wear warm clothing and cover your mouth and nose to warm the air that goes into your lungs.  Tell your doctor if you get sensitive to the cold. This drug may make you feel generally unwell. This is not uncommon, as chemotherapy can affect healthy cells as well as cancer cells. Report any side effects. Continue your course of  treatment even though you feel ill unless your doctor tells you to stop. In some cases, you may be given additional medicines to help with side effects. Follow all directions for their use. Call your doctor or health care professional for advice if you get a fever, chills or sore throat, or other symptoms of a cold or flu. Do not treat yourself. This drug decreases your body's ability to fight infections. Try to avoid being around people who are sick. This medicine may increase your risk to bruise or bleed. Call your doctor or health care professional if you notice any unusual bleeding. Be careful brushing and flossing your teeth or using a toothpick because you may get an infection or bleed more easily. If you have any dental work done, tell your dentist you are receiving this medicine. Avoid taking products that contain aspirin, acetaminophen, ibuprofen, naproxen, or ketoprofen unless instructed by your doctor. These medicines may hide a fever. Do not become pregnant while taking this medicine. Women should inform their doctor if they wish to become pregnant or think they might be pregnant. There is a potential for serious side effects to an unborn child. Talk to your health care professional or pharmacist for more information. Do not breast-feed an infant while taking this medicine. Call your doctor or health care professional if you get diarrhea. Do not treat yourself. What side effects may I notice from receiving this medicine? Side effects that you should report to your doctor or health care professional as soon as possible:  allergic reactions like skin rash, itching or hives, swelling of the face, lips, or tongue  low blood counts - This drug may decrease the number of white blood cells, red blood cells and platelets. You may be at increased risk for infections and bleeding.  signs of infection - fever or chills, cough, sore throat, pain or difficulty passing urine  signs of decreased  platelets or bleeding - bruising, pinpoint red spots on the skin, black, tarry stools, nosebleeds  signs of decreased red blood cells - unusually weak or tired, fainting spells, lightheadedness  breathing problems  chest pain, pressure  cough  diarrhea  jaw tightness  mouth sores  nausea and vomiting  pain, swelling, redness or irritation at the injection site  pain, tingling, numbness in the hands or feet  problems with balance, talking, walking  redness, blistering, peeling or loosening of the skin, including inside the mouth  trouble passing urine or change in the amount of urine Side effects that usually do not require medical attention (report to your doctor or health care professional if they continue or are bothersome):  changes in vision  constipation  hair loss  loss of appetite  metallic taste in the mouth or changes in taste  stomach pain This list may not describe all possible side effects. Call your doctor for medical advice about side effects. You may report side effects to FDA at 1-800-FDA-1088. Where should I keep my medicine? This drug is given in a hospital or clinic and will not be stored at home. NOTE: This sheet is a summary. It may not cover all possible information. If you have questions about this medicine, talk to your doctor, pharmacist, or health care provider.  2020 Elsevier/Gold  Standard (2007-09-28 17:22:47)  Leucovorin injection What is this medicine? LEUCOVORIN (loo koe VOR in) is used to prevent or treat the harmful effects of some medicines. This medicine is used to treat anemia caused by a low amount of folic acid in the body. It is also used with 5-fluorouracil (5-FU) to treat colon cancer. This medicine may be used for other purposes; ask your health care provider or pharmacist if you have questions. What should I tell my health care provider before I take this medicine? They need to know if you have any of these  conditions:  anemia from low levels of vitamin B-12 in the blood  an unusual or allergic reaction to leucovorin, folic acid, other medicines, foods, dyes, or preservatives  pregnant or trying to get pregnant  breast-feeding How should I use this medicine? This medicine is for injection into a muscle or into a vein. It is given by a health care professional in a hospital or clinic setting. Talk to your pediatrician regarding the use of this medicine in children. Special care may be needed. Overdosage: If you think you have taken too much of this medicine contact a poison control center or emergency room at once. NOTE: This medicine is only for you. Do not share this medicine with others. What if I miss a dose? This does not apply. What may interact with this medicine?  capecitabine  fluorouracil  phenobarbital  phenytoin  primidone  trimethoprim-sulfamethoxazole This list may not describe all possible interactions. Give your health care provider a list of all the medicines, herbs, non-prescription drugs, or dietary supplements you use. Also tell them if you smoke, drink alcohol, or use illegal drugs. Some items may interact with your medicine. What should I watch for while using this medicine? Your condition will be monitored carefully while you are receiving this medicine. This medicine may increase the side effects of 5-fluorouracil, 5-FU. Tell your doctor or health care professional if you have diarrhea or mouth sores that do not get better or that get worse. What side effects may I notice from receiving this medicine? Side effects that you should report to your doctor or health care professional as soon as possible:  allergic reactions like skin rash, itching or hives, swelling of the face, lips, or tongue  breathing problems  fever, infection  mouth sores  unusual bleeding or bruising  unusually weak or tired Side effects that usually do not require medical  attention (report to your doctor or health care professional if they continue or are bothersome):  constipation or diarrhea  loss of appetite  nausea, vomiting This list may not describe all possible side effects. Call your doctor for medical advice about side effects. You may report side effects to FDA at 1-800-FDA-1088. Where should I keep my medicine? This drug is given in a hospital or clinic and will not be stored at home. NOTE: This sheet is a summary. It may not cover all possible information. If you have questions about this medicine, talk to your doctor, pharmacist, or health care provider.  2020 Elsevier/Gold Standard (2007-09-07 16:50:29)  Fluorouracil, 5-FU injection What is this medicine? FLUOROURACIL, 5-FU (flure oh YOOR a sil) is a chemotherapy drug. It slows the growth of cancer cells. This medicine is used to treat many types of cancer like breast cancer, colon or rectal cancer, pancreatic cancer, and stomach cancer. This medicine may be used for other purposes; ask your health care provider or pharmacist if you have questions. COMMON BRAND NAME(S):  Adrucil What should I tell my health care provider before I take this medicine? They need to know if you have any of these conditions:  blood disorders  dihydropyrimidine dehydrogenase (DPD) deficiency  infection (especially a virus infection such as chickenpox, cold sores, or herpes)  kidney disease  liver disease  malnourished, poor nutrition  recent or ongoing radiation therapy  an unusual or allergic reaction to fluorouracil, other chemotherapy, other medicines, foods, dyes, or preservatives  pregnant or trying to get pregnant  breast-feeding How should I use this medicine? This drug is given as an infusion or injection into a vein. It is administered in a hospital or clinic by a specially trained health care professional. Talk to your pediatrician regarding the use of this medicine in children. Special care  may be needed. Overdosage: If you think you have taken too much of this medicine contact a poison control center or emergency room at once. NOTE: This medicine is only for you. Do not share this medicine with others. What if I miss a dose? It is important not to miss your dose. Call your doctor or health care professional if you are unable to keep an appointment. What may interact with this medicine?  allopurinol  cimetidine  dapsone  digoxin  hydroxyurea  leucovorin  levamisole  medicines for seizures like ethotoin, fosphenytoin, phenytoin  medicines to increase blood counts like filgrastim, pegfilgrastim, sargramostim  medicines that treat or prevent blood clots like warfarin, enoxaparin, and dalteparin  methotrexate  metronidazole  pyrimethamine  some other chemotherapy drugs like busulfan, cisplatin, estramustine, vinblastine  trimethoprim  trimetrexate  vaccines Talk to your doctor or health care professional before taking any of these medicines:  acetaminophen  aspirin  ibuprofen  ketoprofen  naproxen This list may not describe all possible interactions. Give your health care provider a list of all the medicines, herbs, non-prescription drugs, or dietary supplements you use. Also tell them if you smoke, drink alcohol, or use illegal drugs. Some items may interact with your medicine. What should I watch for while using this medicine? Visit your doctor for checks on your progress. This drug may make you feel generally unwell. This is not uncommon, as chemotherapy can affect healthy cells as well as cancer cells. Report any side effects. Continue your course of treatment even though you feel ill unless your doctor tells you to stop. In some cases, you may be given additional medicines to help with side effects. Follow all directions for their use. Call your doctor or health care professional for advice if you get a fever, chills or sore throat, or other  symptoms of a cold or flu. Do not treat yourself. This drug decreases your body's ability to fight infections. Try to avoid being around people who are sick. This medicine may increase your risk to bruise or bleed. Call your doctor or health care professional if you notice any unusual bleeding. Be careful brushing and flossing your teeth or using a toothpick because you may get an infection or bleed more easily. If you have any dental work done, tell your dentist you are receiving this medicine. Avoid taking products that contain aspirin, acetaminophen, ibuprofen, naproxen, or ketoprofen unless instructed by your doctor. These medicines may hide a fever. Do not become pregnant while taking this medicine. Women should inform their doctor if they wish to become pregnant or think they might be pregnant. There is a potential for serious side effects to an unborn child. Talk to your health care professional  or pharmacist for more information. Do not breast-feed an infant while taking this medicine. Men should inform their doctor if they wish to father a child. This medicine may lower sperm counts. Do not treat diarrhea with over the counter products. Contact your doctor if you have diarrhea that lasts more than 2 days or if it is severe and watery. This medicine can make you more sensitive to the sun. Keep out of the sun. If you cannot avoid being in the sun, wear protective clothing and use sunscreen. Do not use sun lamps or tanning beds/booths. What side effects may I notice from receiving this medicine? Side effects that you should report to your doctor or health care professional as soon as possible:  allergic reactions like skin rash, itching or hives, swelling of the face, lips, or tongue  low blood counts - this medicine may decrease the number of white blood cells, red blood cells and platelets. You may be at increased risk for infections and bleeding.  signs of infection - fever or chills, cough,  sore throat, pain or difficulty passing urine  signs of decreased platelets or bleeding - bruising, pinpoint red spots on the skin, black, tarry stools, blood in the urine  signs of decreased red blood cells - unusually weak or tired, fainting spells, lightheadedness  breathing problems  changes in vision  chest pain  mouth sores  nausea and vomiting  pain, swelling, redness at site where injected  pain, tingling, numbness in the hands or feet  redness, swelling, or sores on hands or feet  stomach pain  unusual bleeding Side effects that usually do not require medical attention (report to your doctor or health care professional if they continue or are bothersome):  changes in finger or toe nails  diarrhea  dry or itchy skin  hair loss  headache  loss of appetite  sensitivity of eyes to the light  stomach upset  unusually teary eyes This list may not describe all possible side effects. Call your doctor for medical advice about side effects. You may report side effects to FDA at 1-800-FDA-1088. Where should I keep my medicine? This drug is given in a hospital or clinic and will not be stored at home. NOTE: This sheet is a summary. It may not cover all possible information. If you have questions about this medicine, talk to your doctor, pharmacist, or health care provider.  2020 Elsevier/Gold Standard (2007-07-07 13:53:16)

## 2019-02-07 NOTE — Progress Notes (Signed)
[facility] OFFICE PROGRESS NOTE   Diagnosis: Esophagus cancer  INTERVAL HISTORY:   [name] returns for a scheduled visit.  He continues to have hoarseness.  He reports intermittent rectal bleeding.  He has bright red blood at times and dark blood at other times.  He otherwise feels well.  He has occasional "burning" discomfort in the xiphoid region  Objective:  Vital signs in last 24 hours:  Blood pressure (!) 148/54, pulse 75, temperature 98.5 F (36.9 C), temperature source Temporal, resp. rate 17, height 5' 7" (1.702 m), weight 187 lb 4.8 oz (85 kg), SpO2 96 %.     GI: No hepatosplenomegaly Vascular: No leg edema Lymph nodes: No cervical or supraclavicular nodes  Portacath/PICC-without erythema-resolving ecchymosis  Lab Results:  Lab Results  Component Value Date   WBC 2.1 (L) 02/07/2019   HGB 8.9 (L) 02/07/2019   HCT 26.9 (L) 02/07/2019   MCV 92.1 02/07/2019   PLT 66 (L) 02/07/2019   NEUTROABS 1.3 (L) 02/07/2019    CMP  Lab Results  Component Value Date   NA 141 02/07/2019   K 4.0 02/07/2019   CL 112 (H) 02/07/2019   CO2 20 (L) 02/07/2019   GLUCOSE 197 (H) 02/07/2019   BUN 24 (H) 02/07/2019   CREATININE 1.12 02/07/2019   CALCIUM 8.6 (L) 02/07/2019   PROT 6.6 02/07/2019   ALBUMIN 3.1 (L) 02/07/2019   AST 33 02/07/2019   ALT 18 02/07/2019   ALKPHOS 126 02/07/2019   BILITOT 0.6 02/07/2019   GFRNONAA >60 02/07/2019   GFRAA >60 02/07/2019    Medications: I have reviewed the patient's current medications.   Assessment/Plan:  1. 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 Staging CTs 10/26/2017-asymmetric esophageal thickening, no evidence of metastatic disease ? Endoscopic biopsy 10/20/2017-mid esophagus partially obstructing mass, biopsy confirmed squamous cell carcinoma ? PET scan 11/02/2017-wall thickening in the mid esophagus SUV 27 MA: No findings specific for metastatic disease ? Radiation 11/09/2017-12/17/2017  ? Cycle 1 weekly Taxol/carboplatin 11/10/2017 ? Cycle 2 weekly Taxol/carboplatin 11/17/2017 (carboplatin held due to thrombocytopenia) ? Cycle 3 weekly Taxol/carboplatin 12/08/2017 (dosesreduced due to neutropenia, thrombocytopenia) ? Cycle 4 weekly Taxol/carboplatin 12/15/2017 ? Endoscopy 03/29/2018- no mass seen ? CT chest 12/29/2018-2 cm area associated with anterior wall the proximal esophagus, new-nonspecific, changes of cirrhosis ? PD-L1 CPS 5% 2. Solid dysphagia and odynophagia secondary to #1-improved 3. Coronary artery disease 4. History of prostate cancer 5. COPD 6. Permanent cardiac pacemaker 7. Peripheral vascular disease 8. Chronic thrombocytopenia 9. 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 CT chest 12/28/2017-no evidence of pulmonary emboli. Esophageal thickening consistent with patient's history of esophageal carcinoma. 11. Probable cirrhosis, referred to GI 12. Anemia-progressive compared to when he was here in June, potentially related to GI bleeding or epistaxis. 13. 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 Status post evaluation by [name], not a candidate for vocal fold injection at present. 14. Port-A-Cath placed 02/02/2011   Disposition: [name] appears unchanged.  He underwent Port-A-Cath placement and is scheduled to begin FOLFOX chemotherapy today.  He has mild neutropenia and thrombocytopenia.  These findings are likely secondary to cirrhosis.  We discussed the risk of infection and bleeding.  The chemotherapy will be dose reduced and G-CSF will be added.  We reviewed potential toxicities associated with Udenyca.  He agrees to proceed.  [name] reports intermittent rectal bleeding.  The hemoglobin is low.  He is  not symptomatic from anemia at present.  He will return for a CBC and transfusion support as needed in 1 week.  We will refer him  to gastroenterology if there continues to be evidence of GI bleeding.  The bleeding could be related to the locally recurrent tumor or another etiology.  25 spent with the patient today.  The majority of the time was used for counseling and coordination of care.  Betsy Coder, MD  02/07/2019  9:49 AM

## 2019-02-07 NOTE — Telephone Encounter (Signed)
Scheduled per los. Called, could not get through. Mailed printout

## 2019-02-07 NOTE — Progress Notes (Signed)
Patient tolerated treatment well. RN provided education on chemotherapy and pump. Patient expressed understanding and will have daughter help him. No s/s of discomfort or distress.

## 2019-02-07 NOTE — Telephone Encounter (Signed)
Scheduled per los. Gave avs and calendar  

## 2019-02-07 NOTE — Progress Notes (Signed)
Per Dr. Benay Spice: OK to treat w/ANC 1.3 and platelet 66,000. Has dose reduced oxaliplatin and added Udenyca.

## 2019-02-08 ENCOUNTER — Telehealth: Payer: Self-pay | Admitting: *Deleted

## 2019-02-08 NOTE — Telephone Encounter (Signed)
Called pt to see how he did with his chemo yesterday & he reports doing well & no problems/concerns/ questions.  He states he will be in tomorrow to take pump off & Knows how to reach [place] if needed.

## 2019-02-08 NOTE — Telephone Encounter (Signed)
-----   Message from Wylene Men, RN sent at 02/07/2019  1:45 PM EST ----- Regarding: [facility] FOLFOX Patient received first time folfox on 02/07/2019. No c/o or s/s of discomfort or distress.

## 2019-02-09 ENCOUNTER — Other Ambulatory Visit: Payer: Self-pay

## 2019-02-09 ENCOUNTER — Inpatient Hospital Stay: Payer: Medicare Other

## 2019-02-09 DIAGNOSIS — Z5111 Encounter for antineoplastic chemotherapy: Secondary | ICD-10-CM | POA: Diagnosis not present

## 2019-02-09 DIAGNOSIS — Z95828 Presence of other vascular implants and grafts: Secondary | ICD-10-CM

## 2019-02-09 MED ORDER — HEPARIN SOD (PORK) LOCK FLUSH 100 UNIT/ML IV SOLN
500.0000 [IU] | Freq: Once | INTRAVENOUS | Status: AC
  Administered 2019-02-09: 500 [IU] via INTRAVENOUS
  Filled 2019-02-09: qty 5

## 2019-02-09 MED ORDER — SODIUM CHLORIDE 0.9% FLUSH
10.0000 mL | INTRAVENOUS | Status: DC | PRN
  Administered 2019-02-09: 10 mL via INTRAVENOUS
  Filled 2019-02-09: qty 10

## 2019-02-09 NOTE — Patient Instructions (Signed)

## 2019-02-15 ENCOUNTER — Other Ambulatory Visit: Payer: Self-pay

## 2019-02-15 ENCOUNTER — Inpatient Hospital Stay: Payer: Medicare Other | Attending: Oncology

## 2019-02-15 DIAGNOSIS — Z8546 Personal history of malignant neoplasm of prostate: Secondary | ICD-10-CM | POA: Diagnosis not present

## 2019-02-15 DIAGNOSIS — I2510 Atherosclerotic heart disease of native coronary artery without angina pectoris: Secondary | ICD-10-CM | POA: Diagnosis not present

## 2019-02-15 DIAGNOSIS — I739 Peripheral vascular disease, unspecified: Secondary | ICD-10-CM | POA: Diagnosis not present

## 2019-02-15 DIAGNOSIS — D696 Thrombocytopenia, unspecified: Secondary | ICD-10-CM | POA: Insufficient documentation

## 2019-02-15 DIAGNOSIS — Z950 Presence of cardiac pacemaker: Secondary | ICD-10-CM | POA: Insufficient documentation

## 2019-02-15 DIAGNOSIS — D701 Agranulocytosis secondary to cancer chemotherapy: Secondary | ICD-10-CM | POA: Insufficient documentation

## 2019-02-15 DIAGNOSIS — J449 Chronic obstructive pulmonary disease, unspecified: Secondary | ICD-10-CM | POA: Insufficient documentation

## 2019-02-15 DIAGNOSIS — C154 Malignant neoplasm of middle third of esophagus: Secondary | ICD-10-CM | POA: Diagnosis present

## 2019-02-15 DIAGNOSIS — Z5111 Encounter for antineoplastic chemotherapy: Secondary | ICD-10-CM | POA: Insufficient documentation

## 2019-02-15 LAB — CBC WITH DIFFERENTIAL (CANCER CENTER ONLY)
Abs Immature Granulocytes: 0.03 10*3/uL (ref 0.00–0.07)
Basophils Absolute: 0 10*3/uL (ref 0.0–0.1)
Basophils Relative: 0 %
Eosinophils Absolute: 0.1 10*3/uL (ref 0.0–0.5)
Eosinophils Relative: 4 %
HCT: 27.5 % — ABNORMAL LOW (ref 39.0–52.0)
Hemoglobin: 8.9 g/dL — ABNORMAL LOW (ref 13.0–17.0)
Immature Granulocytes: 1 %
Lymphocytes Relative: 20 %
Lymphs Abs: 0.6 10*3/uL — ABNORMAL LOW (ref 0.7–4.0)
MCH: 30.4 pg (ref 26.0–34.0)
MCHC: 32.4 g/dL (ref 30.0–36.0)
MCV: 93.9 fL (ref 80.0–100.0)
Monocytes Absolute: 0.2 10*3/uL (ref 0.1–1.0)
Monocytes Relative: 8 %
Neutro Abs: 1.9 10*3/uL (ref 1.7–7.7)
Neutrophils Relative %: 67 %
Platelet Count: 51 10*3/uL — ABNORMAL LOW (ref 150–400)
RBC: 2.93 MIL/uL — ABNORMAL LOW (ref 4.22–5.81)
RDW: 13.8 % (ref 11.5–15.5)
WBC Count: 2.9 10*3/uL — ABNORMAL LOW (ref 4.0–10.5)
nRBC: 0 % (ref 0.0–0.2)

## 2019-02-15 LAB — SAMPLE TO BLOOD BANK

## 2019-02-16 ENCOUNTER — Telehealth: Payer: Self-pay | Admitting: *Deleted

## 2019-02-16 NOTE — Telephone Encounter (Signed)
-----   Message from Ladell Pier, MD sent at 02/16/2019  1:46 PM EST ----- Please call patient, platelets are slightly lower, call for bleeding, f/u as scheduled

## 2019-02-16 NOTE — Telephone Encounter (Signed)
Per Dr. Benay Spice, called to notify pt that his platelets were slightly lower and to call for bleeding and f/u as scheduled. Pt stated, "I had a nose bleed 2 days ago but it stopped. I've always had trouble with nosebleeds." Advised pt to go to call office is condition continues. Pt verbalized understanding.

## 2019-02-20 ENCOUNTER — Other Ambulatory Visit: Payer: Self-pay | Admitting: Oncology

## 2019-02-21 ENCOUNTER — Other Ambulatory Visit: Payer: Self-pay

## 2019-02-21 ENCOUNTER — Inpatient Hospital Stay (HOSPITAL_BASED_OUTPATIENT_CLINIC_OR_DEPARTMENT_OTHER): Payer: Medicare Other | Admitting: Nurse Practitioner

## 2019-02-21 ENCOUNTER — Inpatient Hospital Stay: Payer: Medicare Other

## 2019-02-21 ENCOUNTER — Encounter: Payer: Self-pay | Admitting: Nurse Practitioner

## 2019-02-21 ENCOUNTER — Telehealth: Payer: Self-pay | Admitting: Nurse Practitioner

## 2019-02-21 VITALS — BP 135/66 | HR 62 | Temp 99.1°F | Resp 18 | Ht 67.0 in | Wt 187.6 lb

## 2019-02-21 DIAGNOSIS — Z5111 Encounter for antineoplastic chemotherapy: Secondary | ICD-10-CM | POA: Diagnosis not present

## 2019-02-21 DIAGNOSIS — C159 Malignant neoplasm of esophagus, unspecified: Secondary | ICD-10-CM | POA: Diagnosis not present

## 2019-02-21 DIAGNOSIS — Z95828 Presence of other vascular implants and grafts: Secondary | ICD-10-CM

## 2019-02-21 DIAGNOSIS — C154 Malignant neoplasm of middle third of esophagus: Secondary | ICD-10-CM

## 2019-02-21 LAB — CBC WITH DIFFERENTIAL (CANCER CENTER ONLY)
Abs Immature Granulocytes: 0 10*3/uL (ref 0.00–0.07)
Basophils Absolute: 0 10*3/uL (ref 0.0–0.1)
Basophils Relative: 1 %
Eosinophils Absolute: 0.1 10*3/uL (ref 0.0–0.5)
Eosinophils Relative: 4 %
HCT: 24.9 % — ABNORMAL LOW (ref 39.0–52.0)
Hemoglobin: 8.1 g/dL — ABNORMAL LOW (ref 13.0–17.0)
Immature Granulocytes: 0 %
Lymphocytes Relative: 26 %
Lymphs Abs: 0.4 10*3/uL — ABNORMAL LOW (ref 0.7–4.0)
MCH: 30.5 pg (ref 26.0–34.0)
MCHC: 32.5 g/dL (ref 30.0–36.0)
MCV: 93.6 fL (ref 80.0–100.0)
Monocytes Absolute: 0.3 10*3/uL (ref 0.1–1.0)
Monocytes Relative: 17 %
Neutro Abs: 0.8 10*3/uL — ABNORMAL LOW (ref 1.7–7.7)
Neutrophils Relative %: 52 %
Platelet Count: 54 10*3/uL — ABNORMAL LOW (ref 150–400)
RBC: 2.66 MIL/uL — ABNORMAL LOW (ref 4.22–5.81)
RDW: 14.3 % (ref 11.5–15.5)
WBC Count: 1.4 10*3/uL — ABNORMAL LOW (ref 4.0–10.5)
nRBC: 0 % (ref 0.0–0.2)

## 2019-02-21 LAB — CMP (CANCER CENTER ONLY)
ALT: 20 U/L (ref 0–44)
AST: 28 U/L (ref 15–41)
Albumin: 3.1 g/dL — ABNORMAL LOW (ref 3.5–5.0)
Alkaline Phosphatase: 124 U/L (ref 38–126)
Anion gap: 6 (ref 5–15)
BUN: 19 mg/dL (ref 8–23)
CO2: 25 mmol/L (ref 22–32)
Calcium: 8.5 mg/dL — ABNORMAL LOW (ref 8.9–10.3)
Chloride: 109 mmol/L (ref 98–111)
Creatinine: 1.06 mg/dL (ref 0.61–1.24)
GFR, Est AFR Am: >60 mL/min (ref >60)
GFR, Estimated: >60 mL/min (ref >60)
Glucose, Bld: 101 mg/dL — ABNORMAL HIGH (ref 70–99)
Potassium: 4.5 mmol/L (ref 3.5–5.1)
Sodium: 140 mmol/L (ref 135–145)
Total Bilirubin: 0.6 mg/dL (ref 0.3–1.2)
Total Protein: 6.3 g/dL — ABNORMAL LOW (ref 6.5–8.1)

## 2019-02-21 LAB — SAMPLE TO BLOOD BANK

## 2019-02-21 MED ORDER — SODIUM CHLORIDE 0.9% FLUSH
10.0000 mL | INTRAVENOUS | Status: DC | PRN
  Administered 2019-02-21: 10 mL
  Filled 2019-02-21: qty 10

## 2019-02-21 MED ORDER — HEPARIN SOD (PORK) LOCK FLUSH 100 UNIT/ML IV SOLN
500.0000 [IU] | Freq: Once | INTRAVENOUS | Status: AC | PRN
  Administered 2019-02-21: 500 [IU]
  Filled 2019-02-21: qty 5

## 2019-02-21 NOTE — Progress Notes (Signed)
[place] OFFICE PROGRESS NOTE   Diagnosis: Esophagus cancer  INTERVAL HISTORY:   [name] returns as scheduled.  He completed cycle 1 FOLFOX 02/07/2019.  He denies nausea/vomiting.  No mouth sores.  He had one loose stool.  He avoided cold for 4 days and then resumed without difficulty.  No fever, cough, shortness of breath.  He reports 1 mild nosebleed.  No other bleeding.  Objective:  Vital signs in last 24 hours:  Blood pressure 135/66, pulse 62, temperature 99.1 F (37.3 C), temperature source Temporal, resp. rate 18, height '5\' 7"'  (1.702 m), weight 187 lb 9.6 oz (85.1 kg), SpO2 99 %.    HEENT: No thrush or ulcers. GI: Abdomen soft and nontender.  No hepatomegaly. Vascular: No leg edema.  Skin: Palms without erythema. Port-A-Cath without erythema.   Lab Results:  Lab Results  Component Value Date   WBC 1.4 (L) 02/21/2019   HGB 8.1 (L) 02/21/2019   HCT 24.9 (L) 02/21/2019   MCV 93.6 02/21/2019   PLT 54 (L) 02/21/2019   NEUTROABS 0.8 (L) 02/21/2019    Imaging:  No results found.  Medications: I have reviewed the patient's current medications.  Assessment/Plan: 1. 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 Staging CTs 10/26/2017-asymmetric esophageal thickening, no evidence of metastatic disease ? Endoscopic biopsy 10/20/2017-mid esophagus partially obstructing mass, biopsy confirmed squamous cell carcinoma ? PET scan 11/02/2017-wall thickening in the mid esophagus SUV 27 MA: No findings specific for metastatic disease ? Radiation 11/09/2017-12/17/2017 ? Cycle 1 weekly Taxol/carboplatin 11/10/2017 ? Cycle 2 weekly Taxol/carboplatin 11/17/2017 (carboplatin held due to thrombocytopenia) ? Cycle 3 weekly Taxol/carboplatin 12/08/2017 (dosesreduced due to neutropenia, thrombocytopenia) ? Cycle 4 weekly Taxol/carboplatin 12/15/2017 ? Endoscopy 03/29/2018-no mass seen ? CT chest 12/29/2018-2 cm area associated with anterior wall the  proximal esophagus, new-nonspecific, changes of cirrhosis ? PD-L1 CPS 5% ? Cycle 1 FOLFOX 02/07/2019 2. Solid dysphagia and odynophagia secondary to #1-improved 3. Coronary artery disease 4. History of prostate cancer 5. COPD 6. Permanent cardiac pacemaker 7. Peripheral vascular disease 8. Chronic thrombocytopenia 9. 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 CT chest 12/28/2017-no evidence of pulmonary emboli. Esophageal thickening consistent with patient's history of esophageal carcinoma. 11. Probable cirrhosis, referred to GI 12. Anemia-progressive compared to when he was here in June, potentially related to GI bleeding or epistaxis. 13. 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 Status post evaluation by [name], not a candidate for vocal fold injection at present. 14. Port-A-Cath placed 02/02/2011 15. Neutropenia secondary to chemotherapy    Disposition: [name] appears stable.  He has completed 1 cycle of FOLFOX.  He tolerated well.  Unfortunately he did not receive white cell growth factor support.  We reviewed the CBC from today.  He is neutropenic.  Precautions reviewed.  He will contact the office with fever, chills, other signs of infection.  We are holding today's chemotherapy and rescheduling for 1 week with the plan for white cell growth factor support on the day of pump discontinuation.  He has stable thrombocytopenia.  He understands to contact the office with bleeding.  He has progressive anemia.  He will have a repeat CBC in 1 week with transfusion support as needed.  He will return for lab, follow-up, cycle 2 FOLFOX on 02/28/2019.  He will contact the office in the interim as outlined above or with any other problems.  25 minutes were spent face-to-face at today's  visit with the majority of that time involved in counseling/coordination of care.     Ned Card ANP/GNP-BC   02/21/2019  10:36 AM

## 2019-02-21 NOTE — Telephone Encounter (Signed)
Scheduled appt per 12/7 los - gave patient AVS and calender per los

## 2019-02-22 ENCOUNTER — Ambulatory Visit: Payer: Medicare Other | Admitting: Gastroenterology

## 2019-02-23 ENCOUNTER — Inpatient Hospital Stay: Payer: Medicare Other

## 2019-02-28 ENCOUNTER — Encounter: Payer: Self-pay | Admitting: Nurse Practitioner

## 2019-02-28 ENCOUNTER — Other Ambulatory Visit: Payer: Self-pay

## 2019-02-28 ENCOUNTER — Inpatient Hospital Stay (HOSPITAL_BASED_OUTPATIENT_CLINIC_OR_DEPARTMENT_OTHER): Payer: Medicare Other | Admitting: Nurse Practitioner

## 2019-02-28 ENCOUNTER — Inpatient Hospital Stay: Payer: Medicare Other

## 2019-02-28 ENCOUNTER — Telehealth: Payer: Self-pay | Admitting: Nurse Practitioner

## 2019-02-28 VITALS — BP 120/64 | HR 72 | Temp 98.9°F | Resp 17 | Ht 67.0 in | Wt 188.7 lb

## 2019-02-28 DIAGNOSIS — C159 Malignant neoplasm of esophagus, unspecified: Secondary | ICD-10-CM

## 2019-02-28 DIAGNOSIS — Z95828 Presence of other vascular implants and grafts: Secondary | ICD-10-CM | POA: Diagnosis not present

## 2019-02-28 DIAGNOSIS — Z5111 Encounter for antineoplastic chemotherapy: Secondary | ICD-10-CM | POA: Diagnosis not present

## 2019-02-28 LAB — CMP (CANCER CENTER ONLY)
ALT: 17 U/L (ref 0–44)
AST: 29 U/L (ref 15–41)
Albumin: 3.1 g/dL — ABNORMAL LOW (ref 3.5–5.0)
Alkaline Phosphatase: 128 U/L — ABNORMAL HIGH (ref 38–126)
Anion gap: 9 (ref 5–15)
BUN: 18 mg/dL (ref 8–23)
CO2: 22 mmol/L (ref 22–32)
Calcium: 8.6 mg/dL — ABNORMAL LOW (ref 8.9–10.3)
Chloride: 110 mmol/L (ref 98–111)
Creatinine: 1.09 mg/dL (ref 0.61–1.24)
GFR, Est AFR Am: >60 mL/min (ref >60)
GFR, Estimated: >60 mL/min (ref >60)
Glucose, Bld: 106 mg/dL — ABNORMAL HIGH (ref 70–99)
Potassium: 4.2 mmol/L (ref 3.5–5.1)
Sodium: 141 mmol/L (ref 135–145)
Total Bilirubin: 0.6 mg/dL (ref 0.3–1.2)
Total Protein: 6.4 g/dL — ABNORMAL LOW (ref 6.5–8.1)

## 2019-02-28 LAB — CBC WITH DIFFERENTIAL (CANCER CENTER ONLY)
Abs Immature Granulocytes: 0 10*3/uL (ref 0.00–0.07)
Basophils Absolute: 0 10*3/uL (ref 0.0–0.1)
Basophils Relative: 1 %
Eosinophils Absolute: 0.1 10*3/uL (ref 0.0–0.5)
Eosinophils Relative: 5 %
HCT: 26.1 % — ABNORMAL LOW (ref 39.0–52.0)
Hemoglobin: 8.4 g/dL — ABNORMAL LOW (ref 13.0–17.0)
Immature Granulocytes: 0 %
Lymphocytes Relative: 30 %
Lymphs Abs: 0.5 10*3/uL — ABNORMAL LOW (ref 0.7–4.0)
MCH: 29.8 pg (ref 26.0–34.0)
MCHC: 32.2 g/dL (ref 30.0–36.0)
MCV: 92.6 fL (ref 80.0–100.0)
Monocytes Absolute: 0.6 10*3/uL (ref 0.1–1.0)
Monocytes Relative: 34 %
Neutro Abs: 0.5 10*3/uL — ABNORMAL LOW (ref 1.7–7.7)
Neutrophils Relative %: 30 %
Platelet Count: 79 10*3/uL — ABNORMAL LOW (ref 150–400)
RBC: 2.82 MIL/uL — ABNORMAL LOW (ref 4.22–5.81)
RDW: 14.7 % (ref 11.5–15.5)
WBC Count: 1.5 10*3/uL — ABNORMAL LOW (ref 4.0–10.5)
nRBC: 0 % (ref 0.0–0.2)

## 2019-02-28 LAB — SAMPLE TO BLOOD BANK

## 2019-02-28 MED ORDER — HEPARIN SOD (PORK) LOCK FLUSH 100 UNIT/ML IV SOLN
500.0000 [IU] | Freq: Once | INTRAVENOUS | Status: AC | PRN
  Administered 2019-02-28: 500 [IU]
  Filled 2019-02-28: qty 5

## 2019-02-28 MED ORDER — SODIUM CHLORIDE 0.9% FLUSH
10.0000 mL | INTRAVENOUS | Status: DC | PRN
  Administered 2019-02-28: 10 mL
  Filled 2019-02-28: qty 10

## 2019-02-28 NOTE — Telephone Encounter (Signed)
Scheduled appt per 12/14 los.  Printed calendar per pt request.

## 2019-02-28 NOTE — Progress Notes (Signed)
  [place] OFFICE PROGRESS NOTE   Diagnosis: Esophagus cancer  INTERVAL HISTORY:   [name] returns as scheduled.  Cycle 2 FOLFOX was held 02/21/2019 due to neutropenia.  No fever or chills.  He denies nausea/vomiting.  No mouth sores.  No diarrhea.  No numbness or tingling in the hands or feet.  Objective:  Vital signs in last 24 hours:  Blood pressure 120/64, pulse 72, temperature 98.9 F (37.2 C), temperature source Temporal, resp. rate 17, height '5\' 7"'$  (1.702 m), weight 188 lb 11.2 oz (85.6 kg), SpO2 99 %.    HEENT: No thrush or ulcers. GI: Abdomen soft and nontender.  No hepatomegaly. Vascular: No leg edema.  Skin: Palms without erythema. Port-A-Cath without erythema.   Lab Results:  Lab Results  Component Value Date   WBC 1.5 (L) 02/28/2019   HGB 8.4 (L) 02/28/2019   HCT 26.1 (L) 02/28/2019   MCV 92.6 02/28/2019   PLT 79 (L) 02/28/2019   NEUTROABS PENDING 02/28/2019    Imaging:  No results found.  Medications: I have reviewed the patient's current medications.  Assessment/Plan: 1. 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 Staging CTs 10/26/2017-asymmetric esophageal thickening, no evidence of metastatic disease ? Endoscopic biopsy 10/20/2017-mid esophagus partially obstructing mass, biopsy confirmed squamous cell carcinoma ? PET scan 11/02/2017-wall thickening in the mid esophagus SUV 27 MA: No findings specific for metastatic disease ? Radiation 11/09/2017-12/17/2017 ? Cycle 1 weekly Taxol/carboplatin 11/10/2017 ? Cycle 2 weekly Taxol/carboplatin 11/17/2017 (carboplatin held due to thrombocytopenia) ? Cycle 3 weekly Taxol/carboplatin 12/08/2017 (dosesreduced due to neutropenia, thrombocytopenia) ? Cycle 4 weekly Taxol/carboplatin 12/15/2017 ? Endoscopy 03/29/2018-no mass seen ? CT chest 12/29/2018-2 cm area associated with anterior wall the proximal esophagus, new-nonspecific, changes of cirrhosis ? PD-L1 CPS 5% ? Cycle 1  FOLFOX 02/07/2019 2. Solid dysphagia and odynophagia secondary to #1-improved 3. Coronary artery disease 4. History of prostate cancer 5. COPD 6. Permanent cardiac pacemaker 7. Peripheral vascular disease 8. Chronic thrombocytopenia 9. 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 CT chest 12/28/2017-no evidence of pulmonary emboli. Esophageal thickening consistent with patient's history of esophageal carcinoma. 11. Probable cirrhosis, referred to GI 12. Anemia-progressive compared to when he was here in June, potentially related to GI bleeding or epistaxis. 13. 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 Status post evaluation by [name], not a candidate for vocal fold injection at present. 14. Port-A-Cath placed 02/02/2011 15. Neutropenia secondary to chemotherapy   Disposition: [name] appears stable.  He has completed 1 cycle of FOLFOX.  Cycle 2 was held last week due to neutropenia.  We reviewed the CBC from today.  He has persistent neutropenia.  Cycle 2 will be held again today and rescheduled for 1 week.  He understands to contact the office with fever, chills, other signs of infection.  He will return for lab and cycle 2 FOLFOX in 1 week with the plan for Udenyca on the day of pump discontinuation.  We will see him in follow-up prior to cycle 3.  He will contact the office in the interim as outlined above or with any other problems.    Ned Card ANP/GNP-BC   02/28/2019  11:28 AM

## 2019-02-28 NOTE — Progress Notes (Signed)
Per Lattie Haw treatment canceled today. Nurse infusion notified and patient deaccessed

## 2019-03-01 ENCOUNTER — Other Ambulatory Visit: Payer: Self-pay | Admitting: Cardiology

## 2019-03-02 ENCOUNTER — Inpatient Hospital Stay: Payer: Medicare Other

## 2019-03-07 ENCOUNTER — Other Ambulatory Visit: Payer: Self-pay

## 2019-03-07 ENCOUNTER — Inpatient Hospital Stay: Payer: Medicare Other

## 2019-03-07 ENCOUNTER — Ambulatory Visit (INDEPENDENT_AMBULATORY_CARE_PROVIDER_SITE_OTHER): Payer: Medicare Other | Admitting: *Deleted

## 2019-03-07 VITALS — BP 147/72 | HR 73 | Temp 98.7°F | Resp 18

## 2019-03-07 DIAGNOSIS — Z950 Presence of cardiac pacemaker: Secondary | ICD-10-CM

## 2019-03-07 DIAGNOSIS — Z5111 Encounter for antineoplastic chemotherapy: Secondary | ICD-10-CM | POA: Diagnosis not present

## 2019-03-07 DIAGNOSIS — C159 Malignant neoplasm of esophagus, unspecified: Secondary | ICD-10-CM

## 2019-03-07 DIAGNOSIS — Z95828 Presence of other vascular implants and grafts: Secondary | ICD-10-CM

## 2019-03-07 DIAGNOSIS — C154 Malignant neoplasm of middle third of esophagus: Secondary | ICD-10-CM

## 2019-03-07 LAB — CBC WITH DIFFERENTIAL (CANCER CENTER ONLY)
Abs Immature Granulocytes: 0.01 10*3/uL (ref 0.00–0.07)
Basophils Absolute: 0 10*3/uL (ref 0.0–0.1)
Basophils Relative: 0 %
Eosinophils Absolute: 0.1 10*3/uL (ref 0.0–0.5)
Eosinophils Relative: 2 %
HCT: 28.4 % — ABNORMAL LOW (ref 39.0–52.0)
Hemoglobin: 8.9 g/dL — ABNORMAL LOW (ref 13.0–17.0)
Immature Granulocytes: 0 %
Lymphocytes Relative: 16 %
Lymphs Abs: 0.5 10*3/uL — ABNORMAL LOW (ref 0.7–4.0)
MCH: 29.1 pg (ref 26.0–34.0)
MCHC: 31.3 g/dL (ref 30.0–36.0)
MCV: 92.8 fL (ref 80.0–100.0)
Monocytes Absolute: 0.6 10*3/uL (ref 0.1–1.0)
Monocytes Relative: 20 %
Neutro Abs: 1.7 10*3/uL (ref 1.7–7.7)
Neutrophils Relative %: 62 %
Platelet Count: 77 10*3/uL — ABNORMAL LOW (ref 150–400)
RBC: 3.06 MIL/uL — ABNORMAL LOW (ref 4.22–5.81)
RDW: 14.7 % (ref 11.5–15.5)
WBC Count: 2.8 10*3/uL — ABNORMAL LOW (ref 4.0–10.5)
nRBC: 0 % (ref 0.0–0.2)

## 2019-03-07 LAB — CUP PACEART REMOTE DEVICE CHECK
Battery Remaining Longevity: 38 mo
Battery Remaining Percentage: 29 %
Battery Voltage: 2.81 V
Brady Statistic AP VP Percent: 1 %
Brady Statistic AP VS Percent: 1 %
Brady Statistic AS VP Percent: 1 %
Brady Statistic AS VS Percent: 98 %
Brady Statistic RA Percent Paced: 1 %
Brady Statistic RV Percent Paced: 1 %
Date Time Interrogation Session: 20201221023852
Implantable Lead Implant Date: 19990914
Implantable Lead Implant Date: 19990914
Implantable Lead Location: 753859
Implantable Lead Location: 753860
Implantable Pulse Generator Implant Date: 20110601
Lead Channel Impedance Value: 310 Ohm
Lead Channel Impedance Value: 450 Ohm
Lead Channel Pacing Threshold Amplitude: 0.5 V
Lead Channel Pacing Threshold Amplitude: 1.375 V
Lead Channel Pacing Threshold Pulse Width: 0.4 ms
Lead Channel Pacing Threshold Pulse Width: 0.7 ms
Lead Channel Sensing Intrinsic Amplitude: 0.8 mV
Lead Channel Sensing Intrinsic Amplitude: 12 mV
Lead Channel Setting Pacing Amplitude: 1.625
Lead Channel Setting Pacing Amplitude: 2 V
Lead Channel Setting Pacing Pulse Width: 0.7 ms
Lead Channel Setting Sensing Sensitivity: 2 mV
Pulse Gen Model: 2210
Pulse Gen Serial Number: 7130714

## 2019-03-07 LAB — CMP (CANCER CENTER ONLY)
ALT: 16 U/L (ref 0–44)
AST: 28 U/L (ref 15–41)
Albumin: 3.1 g/dL — ABNORMAL LOW (ref 3.5–5.0)
Alkaline Phosphatase: 121 U/L (ref 38–126)
Anion gap: 10 (ref 5–15)
BUN: 24 mg/dL — ABNORMAL HIGH (ref 8–23)
CO2: 21 mmol/L — ABNORMAL LOW (ref 22–32)
Calcium: 8.6 mg/dL — ABNORMAL LOW (ref 8.9–10.3)
Chloride: 110 mmol/L (ref 98–111)
Creatinine: 1.05 mg/dL (ref 0.61–1.24)
GFR, Est AFR Am: >60 mL/min (ref >60)
GFR, Estimated: >60 mL/min (ref >60)
Glucose, Bld: 159 mg/dL — ABNORMAL HIGH (ref 70–99)
Potassium: 4.5 mmol/L (ref 3.5–5.1)
Sodium: 141 mmol/L (ref 135–145)
Total Bilirubin: 0.5 mg/dL (ref 0.3–1.2)
Total Protein: 6.7 g/dL (ref 6.5–8.1)

## 2019-03-07 MED ORDER — DEXTROSE 5 % IV SOLN
Freq: Once | INTRAVENOUS | Status: AC
  Filled 2019-03-07: qty 250

## 2019-03-07 MED ORDER — SODIUM CHLORIDE 0.9 % IV SOLN
2000.0000 mg/m2 | INTRAVENOUS | Status: DC
  Administered 2019-03-07: 4050 mg via INTRAVENOUS
  Filled 2019-03-07: qty 81

## 2019-03-07 MED ORDER — PALONOSETRON HCL INJECTION 0.25 MG/5ML
0.2500 mg | Freq: Once | INTRAVENOUS | Status: AC
  Administered 2019-03-07: 0.25 mg via INTRAVENOUS

## 2019-03-07 MED ORDER — LEUCOVORIN CALCIUM INJECTION 350 MG
300.0000 mg/m2 | Freq: Once | INTRAVENOUS | Status: AC
  Administered 2019-03-07: 606 mg via INTRAVENOUS
  Filled 2019-03-07: qty 30.3

## 2019-03-07 MED ORDER — OXALIPLATIN CHEMO INJECTION 100 MG/20ML
50.0000 mg/m2 | Freq: Once | INTRAVENOUS | Status: AC
  Administered 2019-03-07: 100 mg via INTRAVENOUS
  Filled 2019-03-07: qty 20

## 2019-03-07 MED ORDER — PALONOSETRON HCL INJECTION 0.25 MG/5ML
INTRAVENOUS | Status: AC
  Filled 2019-03-07: qty 5

## 2019-03-07 MED ORDER — DEXAMETHASONE SODIUM PHOSPHATE 10 MG/ML IJ SOLN
10.0000 mg | Freq: Once | INTRAMUSCULAR | Status: AC
  Administered 2019-03-07: 10 mg via INTRAVENOUS

## 2019-03-07 MED ORDER — DEXAMETHASONE SODIUM PHOSPHATE 10 MG/ML IJ SOLN
INTRAMUSCULAR | Status: AC
  Filled 2019-03-07: qty 1

## 2019-03-07 MED ORDER — SODIUM CHLORIDE 0.9% FLUSH
10.0000 mL | INTRAVENOUS | Status: DC | PRN
  Administered 2019-03-07: 10 mL
  Filled 2019-03-07: qty 10

## 2019-03-07 MED ORDER — SODIUM CHLORIDE 0.9% FLUSH
10.0000 mL | INTRAVENOUS | Status: DC | PRN
  Filled 2019-03-07: qty 10

## 2019-03-07 NOTE — Patient Instructions (Signed)

## 2019-03-07 NOTE — Progress Notes (Signed)
Per V.O. from Dr. Benay Spice ok to treat w/ platelets 77

## 2019-03-07 NOTE — Patient Instructions (Signed)
Cancer Center Discharge Instructions for Patients Receiving Chemotherapy  Today you received the following chemotherapy agents: Oxaliplatin/Leucovorin/Fluorouracil.  To help prevent nausea and vomiting after your treatment, we encourage you to take your nausea medication as directed.   If you develop nausea and vomiting that is not controlled by your nausea medication, call the clinic.   BELOW ARE SYMPTOMS THAT SHOULD BE REPORTED IMMEDIATELY:  *FEVER GREATER THAN 100.5 F  *CHILLS WITH OR WITHOUT FEVER  NAUSEA AND VOMITING THAT IS NOT CONTROLLED WITH YOUR NAUSEA MEDICATION  *UNUSUAL SHORTNESS OF BREATH  *UNUSUAL BRUISING OR BLEEDING  TENDERNESS IN MOUTH AND THROAT WITH OR WITHOUT PRESENCE OF ULCERS  *URINARY PROBLEMS  *BOWEL PROBLEMS  UNUSUAL RASH Items with * indicate a potential emergency and should be followed up as soon as possible.  Feel free to call the clinic should you have any questions or concerns. The clinic phone number is [phone].  Please show the CHEMO ALERT CARD at check-in to the Emergency Department and triage nurse.   

## 2019-03-09 ENCOUNTER — Ambulatory Visit: Payer: Medicare Other

## 2019-03-09 ENCOUNTER — Other Ambulatory Visit: Payer: Self-pay

## 2019-03-09 ENCOUNTER — Inpatient Hospital Stay: Payer: Medicare Other

## 2019-03-09 VITALS — BP 138/63 | HR 62 | Temp 98.7°F | Resp 18

## 2019-03-09 DIAGNOSIS — C159 Malignant neoplasm of esophagus, unspecified: Secondary | ICD-10-CM

## 2019-03-09 DIAGNOSIS — C154 Malignant neoplasm of middle third of esophagus: Secondary | ICD-10-CM

## 2019-03-09 DIAGNOSIS — Z5111 Encounter for antineoplastic chemotherapy: Secondary | ICD-10-CM | POA: Diagnosis not present

## 2019-03-09 MED ORDER — HEPARIN SOD (PORK) LOCK FLUSH 100 UNIT/ML IV SOLN
500.0000 [IU] | Freq: Once | INTRAVENOUS | Status: AC | PRN
  Administered 2019-03-09: 500 [IU]
  Filled 2019-03-09: qty 5

## 2019-03-09 MED ORDER — PEGFILGRASTIM INJECTION 6 MG/0.6ML ~~LOC~~
6.0000 mg | PREFILLED_SYRINGE | Freq: Once | SUBCUTANEOUS | Status: AC
  Administered 2019-03-09: 6 mg via SUBCUTANEOUS

## 2019-03-09 MED ORDER — PEGFILGRASTIM INJECTION 6 MG/0.6ML ~~LOC~~
PREFILLED_SYRINGE | SUBCUTANEOUS | Status: AC
  Filled 2019-03-09: qty 0.6

## 2019-03-09 MED ORDER — SODIUM CHLORIDE 0.9% FLUSH
10.0000 mL | INTRAVENOUS | Status: DC | PRN
  Administered 2019-03-09: 10 mL
  Filled 2019-03-09: qty 10

## 2019-03-09 NOTE — Patient Instructions (Signed)
Pegfilgrastim injection What is this medicine? PEGFILGRASTIM (PEG fil gra stim) is a long-acting granulocyte colony-stimulating factor that stimulates the growth of neutrophils, a type of white blood cell important in the body's fight against infection. It is used to reduce the incidence of fever and infection in patients with certain types of cancer who are receiving chemotherapy that affects the bone marrow, and to increase survival after being exposed to high doses of radiation. This medicine may be used for other purposes; ask your health care provider or pharmacist if you have questions. COMMON BRAND NAME(S): Fulphila, Neulasta, UDENYCA, Ziextenzo What should I tell my health care provider before I take this medicine? They need to know if you have any of these conditions:  kidney disease  latex allergy  ongoing radiation therapy  sickle cell disease  skin reactions to acrylic adhesives (On-Body Injector only)  an unusual or allergic reaction to pegfilgrastim, filgrastim, other medicines, foods, dyes, or preservatives  pregnant or trying to get pregnant  breast-feeding How should I use this medicine? This medicine is for injection under the skin. If you get this medicine at home, you will be taught how to prepare and give the pre-filled syringe or how to use the On-body Injector. Refer to the patient Instructions for Use for detailed instructions. Use exactly as directed. Tell your healthcare provider immediately if you suspect that the On-body Injector may not have performed as intended or if you suspect the use of the On-body Injector resulted in a missed or partial dose. It is important that you put your used needles and syringes in a special sharps container. Do not put them in a trash can. If you do not have a sharps container, call your pharmacist or healthcare provider to get one. Talk to your pediatrician regarding the use of this medicine in children. While this drug may be  prescribed for selected conditions, precautions do apply. Overdosage: If you think you have taken too much of this medicine contact a poison control center or emergency room at once. NOTE: This medicine is only for you. Do not share this medicine with others. What if I miss a dose? It is important not to miss your dose. Call your doctor or health care professional if you miss your dose. If you miss a dose due to an On-body Injector failure or leakage, a new dose should be administered as soon as possible using a single prefilled syringe for manual use. What may interact with this medicine? Interactions have not been studied. Give your health care provider a list of all the medicines, herbs, non-prescription drugs, or dietary supplements you use. Also tell them if you smoke, drink alcohol, or use illegal drugs. Some items may interact with your medicine. This list may not describe all possible interactions. Give your health care provider a list of all the medicines, herbs, non-prescription drugs, or dietary supplements you use. Also tell them if you smoke, drink alcohol, or use illegal drugs. Some items may interact with your medicine. What should I watch for while using this medicine? You may need blood work done while you are taking this medicine. If you are going to need a MRI, CT scan, or other procedure, tell your doctor that you are using this medicine (On-Body Injector only). What side effects may I notice from receiving this medicine? Side effects that you should report to your doctor or health care professional as soon as possible:  allergic reactions like skin rash, itching or hives, swelling of the   face, lips, or tongue  back pain  dizziness  fever  pain, redness, or irritation at site where injected  pinpoint red spots on the skin  red or dark-brown urine  shortness of breath or breathing problems  stomach or side pain, or pain at the  shoulder  swelling  tiredness  trouble passing urine or change in the amount of urine Side effects that usually do not require medical attention (report to your doctor or health care professional if they continue or are bothersome):  bone pain  muscle pain This list may not describe all possible side effects. Call your doctor for medical advice about side effects. You may report side effects to FDA at 1-800-FDA-1088. Where should I keep my medicine? Keep out of the reach of children. If you are using this medicine at home, you will be instructed on how to store it. Throw away any unused medicine after the expiration date on the label. NOTE: This sheet is a summary. It may not cover all possible information. If you have questions about this medicine, talk to your doctor, pharmacist, or health care provider.  2020 Elsevier/Gold Standard (2017-06-08 16:57:08) Implanted Port Home Guide An implanted port is a device that is placed under the skin. It is usually placed in the chest. The device can be used to give IV medicine, to take blood, or for dialysis. You may have an implanted port if:  You need IV medicine that would be irritating to the small veins in your hands or arms.  You need IV medicines, such as antibiotics, for a long period of time.  You need IV nutrition for a long period of time.  You need dialysis. Having a port means that your health care provider will not need to use the veins in your arms for these procedures. You may have fewer limitations when using a port than you would if you used other types of long-term IVs, and you will likely be able to return to normal activities after your incision heals. An implanted port has two main parts:  Reservoir. The reservoir is the part where a needle is inserted to give medicines or draw blood. The reservoir is round. After it is placed, it appears as a small, raised area under your skin.  Catheter. The catheter is a thin,  flexible tube that connects the reservoir to a vein. Medicine that is inserted into the reservoir goes into the catheter and then into the vein. How is my port accessed? To access your port:  A numbing cream may be placed on the skin over the port site.  Your health care provider will put on a mask and sterile gloves.  The skin over your port will be cleaned carefully with a germ-killing soap and allowed to dry.  Your health care provider will gently pinch the port and insert a needle into it.  Your health care provider will check for a blood return to make sure the port is in the vein and is not clogged.  If your port needs to remain accessed to get medicine continuously (constant infusion), your health care provider will place a clear bandage (dressing) over the needle site. The dressing and needle will need to be changed every week, or as told by your health care provider. What is flushing? Flushing helps keep the port from getting clogged. Follow instructions from your health care provider about how and when to flush the port. Ports are usually flushed with saline solution or a medicine   called heparin. The need for flushing will depend on how the port is used:  If the port is only used from time to time to give medicines or draw blood, the port may need to be flushed: ? Before and after medicines have been given. ? Before and after blood has been drawn. ? As part of routine maintenance. Flushing may be recommended every 4-6 weeks.  If a constant infusion is running, the port may not need to be flushed.  Throw away any syringes in a disposal container that is meant for sharp items (sharps container). You can buy a sharps container from a pharmacy, or you can make one by using an empty hard plastic bottle with a cover. How long will my port stay implanted? The port can stay in for as long as your health care provider thinks it is needed. When it is time for the port to come out, a  surgery will be done to remove it. The surgery will be similar to the procedure that was done to put the port in. Follow these instructions at home:   Flush your port as told by your health care provider.  If you need an infusion over several days, follow instructions from your health care provider about how to take care of your port site. Make sure you: ? Wash your hands with soap and water before you change your dressing. If soap and water are not available, use alcohol-based hand sanitizer. ? Change your dressing as told by your health care provider. ? Place any used dressings or infusion bags into a plastic bag. Throw that bag in the trash. ? Keep the dressing that covers the needle clean and dry. Do not get it wet. ? Do not use scissors or sharp objects near the tube. ? Keep the tube clamped, unless it is being used.  Check your port site every day for signs of infection. Check for: ? Redness, swelling, or pain. ? Fluid or blood. ? Pus or a bad smell.  Protect the skin around the port site. ? Avoid wearing bra straps that rub or irritate the site. ? Protect the skin around your port from seat belts. Place a soft pad over your chest if needed.  Bathe or shower as told by your health care provider. The site may get wet as long as you are not actively receiving an infusion.  Return to your normal activities as told by your health care provider. Ask your health care provider what activities are safe for you.  Carry a medical alert card or wear a medical alert bracelet at all times. This will let health care providers know that you have an implanted port in case of an emergency. Get help right away if:  You have redness, swelling, or pain at the port site.  You have fluid or blood coming from your port site.  You have pus or a bad smell coming from the port site.  You have a fever. Summary  Implanted ports are usually placed in the chest for long-term IV access.  Follow  instructions from your health care provider about flushing the port and changing bandages (dressings).  Take care of the area around your port by avoiding clothing that puts pressure on the area, and by watching for signs of infection.  Protect the skin around your port from seat belts. Place a soft pad over your chest if needed.  Get help right away if you have a fever or you have redness,   swelling, pain, drainage, or a bad smell at the port site. This information is not intended to replace advice given to you by your health care provider. Make sure you discuss any questions you have with your health care provider. Document Released: 03/03/2005 Document Revised: 06/25/2018 Document Reviewed: 04/05/2016 Elsevier Patient Education  2020 [employer].

## 2019-03-15 ENCOUNTER — Encounter (HOSPITAL_COMMUNITY): Payer: Self-pay | Admitting: *Deleted

## 2019-03-15 ENCOUNTER — Other Ambulatory Visit: Payer: Self-pay

## 2019-03-15 ENCOUNTER — Emergency Department (HOSPITAL_COMMUNITY)
Admission: EM | Admit: 2019-03-15 | Discharge: 2019-03-15 | Disposition: A | Discharge status: Home / Self Care | Payer: Medicare Other | Attending: Emergency Medicine | Admitting: Emergency Medicine

## 2019-03-15 DIAGNOSIS — Z950 Presence of cardiac pacemaker: Secondary | ICD-10-CM | POA: Diagnosis not present

## 2019-03-15 DIAGNOSIS — I10 Essential (primary) hypertension: Secondary | ICD-10-CM | POA: Insufficient documentation

## 2019-03-15 DIAGNOSIS — Z7982 Long term (current) use of aspirin: Secondary | ICD-10-CM | POA: Insufficient documentation

## 2019-03-15 DIAGNOSIS — Z87891 Personal history of nicotine dependence: Secondary | ICD-10-CM | POA: Insufficient documentation

## 2019-03-15 DIAGNOSIS — I2510 Atherosclerotic heart disease of native coronary artery without angina pectoris: Secondary | ICD-10-CM | POA: Insufficient documentation

## 2019-03-15 DIAGNOSIS — J449 Chronic obstructive pulmonary disease, unspecified: Secondary | ICD-10-CM | POA: Insufficient documentation

## 2019-03-15 DIAGNOSIS — Z79899 Other long term (current) drug therapy: Secondary | ICD-10-CM | POA: Diagnosis not present

## 2019-03-15 DIAGNOSIS — D696 Thrombocytopenia, unspecified: Secondary | ICD-10-CM

## 2019-03-15 DIAGNOSIS — R040 Epistaxis: Secondary | ICD-10-CM | POA: Insufficient documentation

## 2019-03-15 LAB — CBC WITH DIFFERENTIAL/PLATELET
Abs Immature Granulocytes: 0.4 10*3/uL — ABNORMAL HIGH (ref 0.00–0.07)
Basophils Absolute: 0 10*3/uL (ref 0.0–0.1)
Basophils Relative: 1 %
Eosinophils Absolute: 0 10*3/uL (ref 0.0–0.5)
Eosinophils Relative: 1 %
HCT: 26.4 % — ABNORMAL LOW (ref 39.0–52.0)
Hemoglobin: 8.5 g/dL — ABNORMAL LOW (ref 13.0–17.0)
Immature Granulocytes: 10 %
Lymphocytes Relative: 13 %
Lymphs Abs: 0.5 10*3/uL — ABNORMAL LOW (ref 0.7–4.0)
MCH: 30.2 pg (ref 26.0–34.0)
MCHC: 32.2 g/dL (ref 30.0–36.0)
MCV: 94 fL (ref 80.0–100.0)
Monocytes Absolute: 0.7 10*3/uL (ref 0.1–1.0)
Monocytes Relative: 19 %
Neutro Abs: 2.2 10*3/uL (ref 1.7–7.7)
Neutrophils Relative %: 56 %
Platelets: 27 10*3/uL — CL (ref 150–400)
RBC: 2.81 MIL/uL — ABNORMAL LOW (ref 4.22–5.81)
RDW: 15.4 % (ref 11.5–15.5)
WBC: 3.9 10*3/uL — ABNORMAL LOW (ref 4.0–10.5)
nRBC: 1 % — ABNORMAL HIGH (ref 0.0–0.2)

## 2019-03-15 NOTE — ED Triage Notes (Signed)
Per EMS, pt complains of epistaxis to left nostril since 8AM today. Bleeding lasted a few hours, then stopped for 1.5 hours, then started bleeding again. He recently started chemo for esophageal cancer and states epistaxis is side effect. No history of blood thinners. EMS strayed afrin in nostril and place gauze. VSS.  BP 146/81 HR 115-130 O2 94-98% on RA

## 2019-03-15 NOTE — ED Notes (Signed)
Charmian Muff daughter in law would like a call with an update, also she will give him a ride home.

## 2019-03-15 NOTE — ED Notes (Signed)
Extra tubes sent to lab - blue and lt green

## 2019-03-15 NOTE — ED Notes (Signed)
Bleeding remains control pt states feeling much better discharge via W/C to daughters waiting auto conditon

## 2019-03-15 NOTE — ED Notes (Signed)
Date and time results received: 03/15/19 4:56 PM   Test: Platelets Critical Value: 27  Name of Provider Notified: [facility]  Orders Received? Or Actions Taken?

## 2019-03-15 NOTE — ED Provider Notes (Signed)
[facility] DEPT Provider Note   CSN: [id number] Arrival date & time: 03/15/19  1509     History Chief Complaint  Patient presents with  . Epistaxis    Maurya Rutkoski. is a 84 y.o. male.  Patient is 84 year old male with a history of esophageal cancer undergoing chemotherapy as well as chronic thrombocytopenia, alcoholic hepatitis, coronary artery disease and aortic stenosis.  He presents with epistaxis.  He says he has had intermittent nosebleeds in the past and has had to see ENT for cauterizations in the past.  He is actually scheduled to see ENT on January 4 to have a cautery procedure.  He says his left nostril started bleeding about 830 this morning and EMS was called.  However it stopped on its own and he did not want a be transported to the hospital.  He had recurrence of the bleeding about an hour ago and it has been bleeding since that time.  EMS gave him Afrin nose spray and packed it with gauze and it subsequently stopped bleeding.  He denies any dizziness.  He does not feel like any clots are continuing to bleed down the back of his throat.  He has no chest pain or shortness of breath.  No other areas of bleeding.        Past Medical History:  Diagnosis Date  . AAA (abdominal aortic aneurysm) ([place])    a. Last duplex - 3.8cm 01/2013 - due 01/2014.  [name] Acne rosacea   . Alcoholic hepatitis with ascites 08/28/2017  . Anginal pain ([place]) 1996  . Aortic stenosis, mild    Last echo 05/04/12 +LVH  . Arthritis    "shoulders" (09/01/2017)  . Back pain    hx epidural injections  . Baker's cyst    Lt.  [name] CAD (coronary artery disease)    a. s/p MI/PTCA - Cx 1994. b. s/p CABG x5 in 1997. c. Abnl nuc 2003- occ SVG-RCA, patent seq SVG-OM1-dCxOM, patentl LIMA-LAD, patent, SVG-diag. d. Normal nuc 09/2012.  . Carotid artery disease ([place])    a. Duplex 01/2013: mildly abnormal. Mild plaque without significant diameter reduction. Repeat recommended 11/15.    [name] COPD (chronic obstructive pulmonary disease) ([place])   . Dizziness and giddiness 08/04/2013  . GERD (gastroesophageal reflux disease)   . H/O: GI bleed    mild, neg. colonoscopy  . Heart murmur   . HOH (hard of hearing)   . HTN (hypertension)   . Hyperlipidemia   . Hypertensive heart disease   . Myocardial infarction ([place]) 1994  . OSA on CPAP   . OSA treated with BiPAP 11/20/2015   Severe with [facility] 38/hr  . PAF (paroxysmal atrial fibrillation) ([place])    a. Remote per records.   . Pneumonia    history  . Presence of permanent cardiac pacemaker    DOI 1999 with change out 2006; [employer] Jude  . Prostate cancer ([place]) ~ 2010   a. s/p radical retropubic prostatectomy with bilateral pelvic lymph node dissection  . PVD (peripheral vascular disease) ([place])    a. Rt ext iliac stenosis, last PV cath 2003, moderate stenosis last Lower Ext Dopplers 12/16/11 - Rt. ABI 0.96  Lt ABI 1.0.  . Sick sinus syndrome ([place])    a. placed 1999, gen change 2011 - St. Jude.  . Sinus node dysfunction ([place]) 01/20/2013  . Syncope 11/28/97  . Thrombocytopenia (HCC)    chronic, mild  . Valvular heart disease    a. Echo 04/2012:  mild-mod AS, mild AI, mild MR.    Patient Active Problem List   Diagnosis Date Noted  . Port-A-Cath in place 02/21/2019  . Goals of care, counseling/discussion 01/20/2019  . Unstable angina ([place])   . Presbycusis of both ears 04/08/2018  . 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 Anticoagulated by anticoagulation treatment 03/19/2018  . Epistaxis 03/19/2018  . Pacemaker 02/19/2018  . Coronary artery disease involving coronary bypass graft of native heart without angina pectoris 02/19/2018  . Anemia 01/19/2018  . Hypercholesterolemia 12/28/2017  . Encounter for antineoplastic chemotherapy 12/08/2017  . Malignant tumor of middle third of esophagus ([place]) 10/28/2017  . Melena   . Heme positive stool   . Acute post-hemorrhagic anemia   . Esophageal mass   . Angina, class III ([place])  10/01/2017  . Chest pain with moderate risk for cardiac etiology 09/01/2017  . Chest pain, rule out acute myocardial infarction 08/26/2017  . Essential hypertension 05/14/2016  . OSA (obstructive sleep apnea) 11/20/2015  . Obesity (BMI 30-39.9) 11/20/2015  . S/P TAVR (transcatheter aortic valve replacement) 06/12/2015  . Severe aortic stenosis 06/05/2015  . AAA (abdominal aortic aneurysm) without rupture ([place]) 04/27/2015  . PAD (peripheral artery disease) ([place]) 04/27/2015  . Dizziness and giddiness 08/04/2013  . Dizziness 07/19/2013  . SSS (sick sinus syndrome) ([place]) 01/20/2013  . Hypertensive cardiovascular disease 01/20/2013  . Paroxysmal atrial fibrillation (HCC)   . CAD, multiple vessel   . Pacemaker -[employer]   . Aortic stenosis 05/07/2012    Class: Diagnosis of  . Syncope 11/28/1997    Past Surgical History:  Procedure Laterality Date  . APPENDECTOMY    . BIOPSY  10/20/2017   Procedure: BIOPSY;  Surgeon: Doran Stabler, MD;  Location: [facility];  Service: Gastroenterology;;  . CARDIAC CATHETERIZATION  2003   VG to RCA occluded, other grafts patent  . CARDIAC CATHETERIZATION N/A 05/04/2015   Procedure: Right/Left Heart Cath and Coronary/Graft Angiography;  Surgeon: Sherren Mocha, MD;  Location: [facility];  Service: Cardiovascular;  Laterality: N/A;  . CARDIAC CATHETERIZATION  09/01/2017  . CARDIAC VALVE REPLACEMENT    . CATARACT EXTRACTION W/ INTRAOCULAR LENS  IMPLANT, BILATERAL Bilateral   . COLONOSCOPY    . CORONARY ANGIOPLASTY  03/29/92   PTCA to LCX  . CORONARY ARTERY BYPASS GRAFT  1997   LIMA-LAD; VG-diag; seq VG-1st OM & distal LCX; VG-RCA  . CORONARY ATHERECTOMY N/A 09/01/2017   Procedure: CORONARY ATHERECTOMY;  Surgeon: Jettie Booze, MD;  Location: [facility];  Service: Cardiovascular;  Laterality: N/A;  . CORONARY ATHERECTOMY  10/01/2017  . CORONARY ATHERECTOMY N/A 10/01/2017   Procedure: CORONARY ATHERECTOMY;  Surgeon: Leonie Man, MD;  Location: [facility];  Service: Cardiovascular;  Laterality: N/A;  . ESOPHAGOGASTRODUODENOSCOPY (EGD) WITH PROPOFOL N/A 10/20/2017   Procedure: ESOPHAGOGASTRODUODENOSCOPY (EGD) WITH PROPOFOL;  Surgeon: Doran Stabler, MD;  Location: [facility];  Service: Gastroenterology;  Laterality: N/A;  . ESOPHAGOGASTRODUODENOSCOPY (EGD) WITH PROPOFOL N/A 03/29/2018   Procedure: ESOPHAGOGASTRODUODENOSCOPY (EGD) WITH PROPOFOL;  Surgeon: Doran Stabler, MD;  Location: [facility];  Service: Gastroenterology;  Laterality: N/A;  . INSERT / REPLACE / REMOVE PACEMAKER  11/28/1997   pacesetter--ERI 2011  . IR IMAGING GUIDED PORT INSERTION  02/02/2019  . LEFT HEART CATH N/A 09/01/2017   Procedure: Left Heart Cath;  Surgeon: Jettie Booze, MD;  Location: [facility];  Service: Cardiovascular;  Laterality: N/A;  . LEFT HEART  CATH AND CORS/GRAFTS ANGIOGRAPHY N/A 08/28/2017   Procedure: LEFT HEART CATH AND CORS/GRAFTS ANGIOGRAPHY;  Surgeon: Leonie Man, MD;  Location: [facility];  Service: Cardiovascular;  Laterality: N/A;  . PACEMAKER GENERATOR CHANGE  08/15/2009   St. Jude accent  . SUPRAPUBIC PROSTATECTOMY    . TEE WITHOUT CARDIOVERSION N/A 06/12/2015   Procedure: TRANSESOPHAGEAL ECHOCARDIOGRAM (TEE);  Surgeon: Sherren Mocha, MD;  Location: [facility];  Service: Open Heart Surgery;  Laterality: N/A;  . TONSILLECTOMY    . TRANSCATHETER AORTIC VALVE REPLACEMENT, TRANSFEMORAL N/A 06/12/2015   Procedure: TRANSCATHETER AORTIC VALVE REPLACEMENT, TRANSFEMORAL, possible transpical;  Surgeon: Sherren Mocha, MD;  Location: [facility];  Service: Open Heart Surgery;  Laterality: N/A;       Family History  Problem Relation Age of Onset  . Colon cancer Father   . Cancer Brother   . Heart Problems Brother   . Heart Problems Sister   . CAD Other   . Stomach cancer Neg Hx   . Pancreatic cancer Neg Hx   . Esophageal cancer Neg Hx     Social History   Tobacco Use  . Smoking  status: Former Smoker    Packs/day: 2.00    Years: 48.00    Pack years: 96.00    Types: Cigarettes    Quit date: 03/18/1995    Years since quitting: 24.0  . Smokeless tobacco: Never Used  Substance Use Topics  . Alcohol use: No    Comment: 09/01/2017 "nothing in the last couple years"  . Drug use: Never    Home Medications Prior to Admission medications   Medication Sig Start Date End Date Taking? Authorizing Provider  acetaminophen (TYLENOL) 325 MG tablet Take 650 mg by mouth 2 (two) times daily.   Yes [provider]  aspirin EC 81 MG EC tablet Take 1 tablet (81 mg total) by mouth daily. 12/17/18  Yes Cheryln Manly, NP  atorvastatin (LIPITOR) 40 MG tablet Take 1 tablet (40 mg total) by mouth daily at 6 PM. 08/28/17  Yes Kayleen Memos, DO  HYDROcodone-acetaminophen (HYCET) 7.5-325 mg/15 ml solution Take 5-10 mLs by mouth every 6 (six) hours as needed for moderate pain. 01/20/19  Yes Owens Shark, NP  lidocaine-prilocaine (EMLA) cream Apply to port site one hour prior to use 01/20/19  Yes Owens Shark, NP  losartan (COZAAR) 25 MG tablet TAKE 1 TABLET BY MOUTH DAILY Patient taking differently: Take 25 mg by mouth at bedtime.  12/08/18  Yes Daune Perch, NP  metoprolol tartrate (LOPRESSOR) 25 MG tablet TAKE 1 TABLET BY MOUTH TWICE DAILY Patient taking differently: Take 25 mg by mouth 2 (two) times daily.  05/05/18  Yes Baldwin Jamaica, PA-C  nitroGLYCERIN (NITROSTAT) 0.4 MG SL tablet PLACE 1 TABLET UNDER THE TONGUE EVERY 5 MINUTES AS NEEDED FOR CHEST PAIN UP TO 3 DOSES, IF SYMPTOMS PERSIST CALL 911 Patient taking differently: Place 0.4 mg under the tongue every 5 (five) minutes as needed for chest pain.  01/05/19  Yes Lyda Jester M, PA-C  NON FORMULARY Apply 1 application topically See admin instructions. Tridema pain cream: Rub into both shoulders 2 times a day   Yes [provider]  Omega-3 Fatty Acids (FISH OIL) 1200 MG CAPS Take 1,200 mg by mouth every  evening.   Yes [provider]  pantoprazole (PROTONIX) 40 MG tablet TAKE 1 TABLET BY MOUTH EVERY DAY Patient taking differently: Take 40 mg by mouth daily.  03/02/19  Yes Fransico Him  R, MD  prochlorperazine (COMPAZINE) 5 MG tablet Take 1 tablet (5 mg total) by mouth every 6 (six) hours as needed for nausea or vomiting. Patient not taking: Reported on 03/07/2019 01/20/19   Owens Shark, NP    Allergies    Patient has no known allergies.  Review of Systems   Review of Systems  Constitutional: Negative for chills, diaphoresis, fatigue and fever.  HENT: Positive for nosebleeds. Negative for congestion, rhinorrhea and sneezing.   Eyes: Negative.   Respiratory: Negative for cough, chest tightness and shortness of breath.   Cardiovascular: Negative for chest pain and leg swelling.  Gastrointestinal: Negative for abdominal pain, blood in stool, diarrhea, nausea and vomiting.  Genitourinary: Negative for difficulty urinating, flank pain, frequency and hematuria.  Musculoskeletal: Negative for arthralgias and back pain.  Skin: Negative for rash.  Neurological: Negative for dizziness, speech difficulty, weakness, numbness and headaches.    Physical Exam Updated Vital Signs BP (!) 145/80 (BP Location: Left Arm)   Pulse 92   Temp 98.4 F (36.9 C) (Oral)   Resp 14   SpO2 100%   Physical Exam Constitutional:      Appearance: He is well-developed.  HENT:     Head: Normocephalic and atraumatic.     Nose:     Comments: There is packing with bloody gauze in his left nostril.  No active bleeding.  There is no blood in the posterior pharynx. Eyes:     Pupils: Pupils are equal, round, and reactive to light.  Cardiovascular:     Rate and Rhythm: Normal rate and regular rhythm.     Heart sounds: Normal heart sounds.  Pulmonary:     Effort: Pulmonary effort is normal. No respiratory distress.     Breath sounds: Normal breath sounds. No wheezing or rales.  Chest:     Chest wall:  No tenderness.  Abdominal:     General: Bowel sounds are normal.     Palpations: Abdomen is soft.     Tenderness: There is no abdominal tenderness. There is no guarding or rebound.  Musculoskeletal:        General: Normal range of motion.     Cervical back: Normal range of motion and neck supple.  Lymphadenopathy:     Cervical: No cervical adenopathy.  Skin:    General: Skin is warm and dry.     Findings: No rash.  Neurological:     Mental Status: He is alert and oriented to person, place, and time.     ED Results / Procedures / Treatments   Labs (all labs ordered are listed, but only abnormal results are displayed) Labs Reviewed  CBC WITH DIFFERENTIAL/PLATELET - Abnormal; Notable for the following components:      Result Value   WBC 3.9 (*)    RBC 2.81 (*)    Hemoglobin 8.5 (*)    HCT 26.4 (*)    Platelets 27 (*)    nRBC 1.0 (*)    Lymphs Abs 0.5 (*)    Abs Immature Granulocytes 0.40 (*)    All other components within normal limits    EKG None  Radiology No results found.  Procedures Procedures (including critical care time)  Medications Ordered in ED Medications - No data to display  ED Course  I have reviewed the triage vital signs and the nursing notes.  Pertinent labs & imaging results that were available during my care of the patient were reviewed by me and considered in my medical decision  making (see chart for details).    MDM Rules/Calculators/A&P                      Patient's nosebleed resolved after the Afrin and nasal packing that was performed by EMS.  It was left in for about an hour and removed by me.  There is no further bleeding.  Patient was monitored for over another hour with no further bleeding.  No blood down the posterior pharynx.  His platelet count has dropped to 27,000.  It looks like his baseline runs between 50 and 70,000.  I did speak with Dr. Lindi Adie with oncology who advised that I should inbox Dr. Benay Spice and they will follow up  on this.  I did inbox him.  Patient was discharged home in good condition.  He was given symptomatic care instructions and return precautions. Final Clinical Impression(s) / ED Diagnoses Final diagnoses:  Epistaxis  Thrombocytopenia [facility])    Rx / DC Orders ED Discharge Orders    None       Malvin Johns, MD 03/15/19 1827

## 2019-03-16 ENCOUNTER — Telehealth: Payer: Self-pay | Admitting: Nurse Practitioner

## 2019-03-16 ENCOUNTER — Other Ambulatory Visit: Payer: Self-pay | Admitting: *Deleted

## 2019-03-16 DIAGNOSIS — C159 Malignant neoplasm of esophagus, unspecified: Secondary | ICD-10-CM

## 2019-03-16 NOTE — Telephone Encounter (Signed)
Scheduled apt per 12/30 sch message - pt is aware of appt date and time

## 2019-03-16 NOTE — Progress Notes (Signed)
MD requesting CBC and hold blood bank for 12/31. Orders placed and [facility] scheduling message sent.

## 2019-03-17 ENCOUNTER — Telehealth: Payer: Self-pay | Admitting: *Deleted

## 2019-03-17 ENCOUNTER — Other Ambulatory Visit: Payer: Self-pay

## 2019-03-17 ENCOUNTER — Inpatient Hospital Stay: Payer: Medicare Other

## 2019-03-17 DIAGNOSIS — C159 Malignant neoplasm of esophagus, unspecified: Secondary | ICD-10-CM

## 2019-03-17 DIAGNOSIS — Z5111 Encounter for antineoplastic chemotherapy: Secondary | ICD-10-CM | POA: Diagnosis not present

## 2019-03-17 LAB — CBC WITH DIFFERENTIAL (CANCER CENTER ONLY)
Abs Immature Granulocytes: 0.42 10*3/uL — ABNORMAL HIGH (ref 0.00–0.07)
Basophils Absolute: 0.1 10*3/uL (ref 0.0–0.1)
Basophils Relative: 2 %
Eosinophils Absolute: 0.1 10*3/uL (ref 0.0–0.5)
Eosinophils Relative: 2 %
HCT: 22.8 % — ABNORMAL LOW (ref 39.0–52.0)
Hemoglobin: 7.2 g/dL — ABNORMAL LOW (ref 13.0–17.0)
Immature Granulocytes: 11 %
Lymphocytes Relative: 16 %
Lymphs Abs: 0.6 10*3/uL — ABNORMAL LOW (ref 0.7–4.0)
MCH: 29.5 pg (ref 26.0–34.0)
MCHC: 31.6 g/dL (ref 30.0–36.0)
MCV: 93.4 fL (ref 80.0–100.0)
Monocytes Absolute: 0.6 10*3/uL (ref 0.1–1.0)
Monocytes Relative: 15 %
Neutro Abs: 2.2 10*3/uL (ref 1.7–7.7)
Neutrophils Relative %: 54 %
Platelet Count: 29 10*3/uL — ABNORMAL LOW (ref 150–400)
RBC: 2.44 MIL/uL — ABNORMAL LOW (ref 4.22–5.81)
RDW: 16.4 % — ABNORMAL HIGH (ref 11.5–15.5)
WBC Count: 4 10*3/uL (ref 4.0–10.5)
nRBC: 2.8 % — ABNORMAL HIGH (ref 0.0–0.2)

## 2019-03-17 LAB — SAMPLE TO BLOOD BANK

## 2019-03-17 NOTE — Telephone Encounter (Signed)
Dr. Benay Spice has reviewed his CBC today. Platelets are stable. Hgb is low--will do hold blood bank on Monday for possible transfusion. Call for bleeding. Spoke with patient--occasional nosebleed that stops with pressure applied. Suggested he use saline nasal spray and avoid blowing his nose. Go to ER if nosebleed does not stop. May need transfusion next week. He understands and agrees. Encouraged him to take it easy over the weekend.

## 2019-03-19 ENCOUNTER — Other Ambulatory Visit: Payer: Self-pay | Admitting: Oncology

## 2019-03-21 ENCOUNTER — Inpatient Hospital Stay: Payer: Medicare Other | Attending: Oncology

## 2019-03-21 ENCOUNTER — Other Ambulatory Visit: Payer: Self-pay

## 2019-03-21 ENCOUNTER — Encounter: Payer: Self-pay | Admitting: Nurse Practitioner

## 2019-03-21 ENCOUNTER — Inpatient Hospital Stay: Payer: Medicare Other

## 2019-03-21 ENCOUNTER — Inpatient Hospital Stay (HOSPITAL_BASED_OUTPATIENT_CLINIC_OR_DEPARTMENT_OTHER): Payer: Medicare Other | Admitting: Nurse Practitioner

## 2019-03-21 VITALS — BP 132/64 | HR 76 | Temp 99.1°F | Resp 17 | Ht 67.0 in | Wt 184.1 lb

## 2019-03-21 DIAGNOSIS — Z5111 Encounter for antineoplastic chemotherapy: Secondary | ICD-10-CM | POA: Insufficient documentation

## 2019-03-21 DIAGNOSIS — C159 Malignant neoplasm of esophagus, unspecified: Secondary | ICD-10-CM

## 2019-03-21 DIAGNOSIS — D6959 Other secondary thrombocytopenia: Secondary | ICD-10-CM | POA: Diagnosis not present

## 2019-03-21 DIAGNOSIS — J449 Chronic obstructive pulmonary disease, unspecified: Secondary | ICD-10-CM | POA: Insufficient documentation

## 2019-03-21 DIAGNOSIS — D6481 Anemia due to antineoplastic chemotherapy: Secondary | ICD-10-CM | POA: Insufficient documentation

## 2019-03-21 DIAGNOSIS — D701 Agranulocytosis secondary to cancer chemotherapy: Secondary | ICD-10-CM | POA: Diagnosis not present

## 2019-03-21 DIAGNOSIS — C154 Malignant neoplasm of middle third of esophagus: Secondary | ICD-10-CM | POA: Insufficient documentation

## 2019-03-21 DIAGNOSIS — Z95828 Presence of other vascular implants and grafts: Secondary | ICD-10-CM

## 2019-03-21 DIAGNOSIS — Z950 Presence of cardiac pacemaker: Secondary | ICD-10-CM | POA: Insufficient documentation

## 2019-03-21 DIAGNOSIS — Z8546 Personal history of malignant neoplasm of prostate: Secondary | ICD-10-CM | POA: Diagnosis not present

## 2019-03-21 LAB — CBC WITH DIFFERENTIAL (CANCER CENTER ONLY)
Abs Immature Granulocytes: 0.19 10*3/uL — ABNORMAL HIGH (ref 0.00–0.07)
Basophils Absolute: 0 10*3/uL (ref 0.0–0.1)
Basophils Relative: 0 %
Eosinophils Absolute: 0.1 10*3/uL (ref 0.0–0.5)
Eosinophils Relative: 2 %
HCT: 23 % — ABNORMAL LOW (ref 39.0–52.0)
Hemoglobin: 7.5 g/dL — ABNORMAL LOW (ref 13.0–17.0)
Immature Granulocytes: 4 %
Lymphocytes Relative: 12 %
Lymphs Abs: 0.6 10*3/uL — ABNORMAL LOW (ref 0.7–4.0)
MCH: 31 pg (ref 26.0–34.0)
MCHC: 32.6 g/dL (ref 30.0–36.0)
MCV: 95 fL (ref 80.0–100.0)
Monocytes Absolute: 0.6 10*3/uL (ref 0.1–1.0)
Monocytes Relative: 13 %
Neutro Abs: 3.1 10*3/uL (ref 1.7–7.7)
Neutrophils Relative %: 69 %
Platelet Count: 52 10*3/uL — ABNORMAL LOW (ref 150–400)
RBC: 2.42 MIL/uL — ABNORMAL LOW (ref 4.22–5.81)
RDW: 17.1 % — ABNORMAL HIGH (ref 11.5–15.5)
WBC Count: 4.5 10*3/uL (ref 4.0–10.5)
nRBC: 0.4 % — ABNORMAL HIGH (ref 0.0–0.2)

## 2019-03-21 LAB — PREPARE RBC (CROSSMATCH)

## 2019-03-21 LAB — CMP (CANCER CENTER ONLY)
ALT: 22 U/L (ref 0–44)
AST: 30 U/L (ref 15–41)
Albumin: 3 g/dL — ABNORMAL LOW (ref 3.5–5.0)
Alkaline Phosphatase: 167 U/L — ABNORMAL HIGH (ref 38–126)
Anion gap: 7 (ref 5–15)
BUN: 14 mg/dL (ref 8–23)
CO2: 24 mmol/L (ref 22–32)
Calcium: 8.3 mg/dL — ABNORMAL LOW (ref 8.9–10.3)
Chloride: 111 mmol/L (ref 98–111)
Creatinine: 1.21 mg/dL (ref 0.61–1.24)
GFR, Est AFR Am: >60 mL/min (ref >60)
GFR, Estimated: 55 mL/min — ABNORMAL LOW (ref >60)
Glucose, Bld: 136 mg/dL — ABNORMAL HIGH (ref 70–99)
Potassium: 4.4 mmol/L (ref 3.5–5.1)
Sodium: 142 mmol/L (ref 135–145)
Total Bilirubin: 0.6 mg/dL (ref 0.3–1.2)
Total Protein: 6 g/dL — ABNORMAL LOW (ref 6.5–8.1)

## 2019-03-21 LAB — ABO/RH: ABO/RH(D): A POS

## 2019-03-21 LAB — SAMPLE TO BLOOD BANK

## 2019-03-21 MED ORDER — HEPARIN SOD (PORK) LOCK FLUSH 100 UNIT/ML IV SOLN
500.0000 [IU] | Freq: Every day | INTRAVENOUS | Status: AC | PRN
  Administered 2019-03-21: 15:00:00 500 [IU]
  Filled 2019-03-21: qty 5

## 2019-03-21 MED ORDER — SODIUM CHLORIDE 0.9% FLUSH
10.0000 mL | INTRAVENOUS | Status: AC | PRN
  Administered 2019-03-21: 10 mL
  Filled 2019-03-21: qty 10

## 2019-03-21 MED ORDER — SODIUM CHLORIDE 0.9% FLUSH
10.0000 mL | INTRAVENOUS | Status: DC | PRN
  Administered 2019-03-21: 10 mL
  Filled 2019-03-21: qty 10

## 2019-03-21 MED ORDER — SODIUM CHLORIDE 0.9% IV SOLUTION
250.0000 mL | Freq: Once | INTRAVENOUS | Status: AC
  Administered 2019-03-21: 12:00:00 250 mL via INTRAVENOUS
  Filled 2019-03-21: qty 250

## 2019-03-21 NOTE — Progress Notes (Signed)
CRITICAL VALUE STICKER  CRITICAL VALUE: Hgb 7.5  RECEIVER (on-site recipient of call): Jari Dipasquale,RN  DATE & TIME NOTIFIED: 03/21/19 @ 1045  MESSENGER (representative from lab):Tomi Bamberger  MD NOTIFIED: Ned Card, NP  TIME OF NOTIFICATION: 1046  RESPONSE:

## 2019-03-21 NOTE — Patient Instructions (Signed)

## 2019-03-21 NOTE — Progress Notes (Addendum)
[name]   Diagnosis: Esophagus cancer  INTERVAL HISTORY:   [name] returns as scheduled.  He completed cycle 2 FOLFOX 02/28/2020.  He was seen in the emergency department 03/15/2019 for evaluation of epistaxis.  Platelet count returned at 27,000, hemoglobin 8.5.  He had no further bleeding in the emergency department.  Follow-up CBC on 03/17/2019 returned with a platelet count of 29,000, hemoglobin 7.2.  He feels weak.  He has noted a small amount of blood with nose blowing.  No significant nosebleeds since evaluation in the emergency department.  No nausea or vomiting.  No mouth sores.  No diarrhea.  Cold sensitivity lasted a few days.  No persistent neuropathy symptoms.  He denies fever.  He has intermittent hiccups.  He has dyspnea on exertion.  Energy level is poor.  Objective:  Vital signs in last 24 hours:  Blood pressure 132/64, pulse 76, temperature 99.1 F (37.3 C), temperature source Temporal, resp. rate 17, height '5\' 7"'  (1.702 m), weight 184 lb 1.6 oz (83.5 kg), SpO2 98 %.    HEENT: No thrush or ulcers. GI: Abdomen soft and nontender.  No hepatomegaly. Vascular: No leg edema. Port-A-Cath without erythema.  Lab Results:  Lab Results  Component Value Date   WBC 4.5 03/21/2019   HGB 7.5 (L) 03/21/2019   HCT 23.0 (L) 03/21/2019   MCV 95.0 03/21/2019   PLT 52 (L) 03/21/2019   NEUTROABS 3.1 03/21/2019    Imaging:  No results found.  Medications: I have reviewed the patient's current medications.  Assessment/Plan: 1. 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 Staging CTs 10/26/2017-asymmetric esophageal thickening, no evidence of metastatic disease ? Endoscopic biopsy 10/20/2017-mid esophagus partially obstructing mass, biopsy confirmed squamous cell carcinoma ? PET scan 11/02/2017-wall thickening in the mid esophagus SUV 27 MA: No findings specific for metastatic disease ? Radiation  11/09/2017-12/17/2017 ? Cycle 1 weekly Taxol/carboplatin 11/10/2017 ? Cycle 2 weekly Taxol/carboplatin 11/17/2017 (carboplatin held due to thrombocytopenia) ? Cycle 3 weekly Taxol/carboplatin 12/08/2017 (dosesreduced due to neutropenia, thrombocytopenia) ? Cycle 4 weekly Taxol/carboplatin 12/15/2017 ? Endoscopy 03/29/2018-no mass seen ? CT chest 12/29/2018-2 cm area associated with anterior wall the proximal esophagus, new-nonspecific, changes of cirrhosis ? PD-L1 CPS 5% ? Cycle 1 FOLFOX 02/07/2019 ? Cycle 2 FOLFOX 03/07/2019, Neulasta 2. Solid dysphagia and odynophagia secondary to #1-improved 3. Coronary artery disease 4. History of prostate cancer 5. COPD 6. Permanent cardiac pacemaker 7. Peripheral vascular disease 8. Chronic thrombocytopenia 9. 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 CT chest 12/28/2017-no evidence of pulmonary emboli. Esophageal thickening consistent with patient's history of esophageal carcinoma. 11. Probable cirrhosis, referred to GI 12. Anemia-progressive compared to when he was here in June, potentially related to GI bleeding or epistaxis. 13. 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 Status post evaluation by [name], not a candidate for vocal fold injection at present. 14. Port-A-Cath placed 02/02/2011 15. Neutropenia secondary to chemotherapy    Disposition: [name] appears stable.  He has completed 2 cycles of FOLFOX.  He received Neulasta with cycle 2.  We reviewed the CBC from today.  He has thrombocytopenia and progressive symptomatic anemia. The platelet count lower than his baseline.  We are holding today's chemotherapy and rescheduling for 1 week.  He will be transfused a unit of blood.    He understands the thrombocytopenia is likely due to a combination of cirrhosis and chemotherapy.  He will  contact the office with any  bleeding.  He has an appointment with ENT tomorrow due to the recent nosebleed requiring evaluation in the emergency department.  He will return for lab, follow-up, cycle 3 FOLFOX in 1 week.  He will contact the office in the interim with any problems.  Patient seen with Dr. Benay Spice.  25 minutes were spent face-to-face at today's visit with the majority of that time involved in counseling/coordination of care.  Ned Card ANP/GNP-BC   03/21/2019  10:56 AM This was a shared visit with Ned Card.  [name] has symptomatic anemia secondary to cirrhosis and chemotherapy.  He will receive a red cell transfusion today.  Chemotherapy will be held secondary to thrombocytopenia, decreased from baseline.  He will see ENT this week to evaluate recurrent nosebleeding hoarseness.  Julieanne Manson, MD

## 2019-03-22 LAB — BPAM RBC
Blood Product Expiration Date: 202101222359
ISSUE DATE / TIME: 202101041311
Unit Type and Rh: 6200

## 2019-03-22 LAB — TYPE AND SCREEN
ABO/RH(D): A POS
Antibody Screen: NEGATIVE
Unit division: 0

## 2019-03-23 ENCOUNTER — Ambulatory Visit: Payer: Medicare Other

## 2019-03-24 ENCOUNTER — Telehealth: Payer: Self-pay | Admitting: Oncology

## 2019-03-24 NOTE — Telephone Encounter (Signed)
Scheduled per los. Called and spoke with patient. Confirmed appts on 1/11 and 1/12

## 2019-03-27 ENCOUNTER — Other Ambulatory Visit: Payer: Self-pay | Admitting: Oncology

## 2019-03-28 ENCOUNTER — Inpatient Hospital Stay: Payer: Medicare Other

## 2019-03-28 ENCOUNTER — Other Ambulatory Visit: Payer: Medicare Other

## 2019-03-28 ENCOUNTER — Other Ambulatory Visit: Payer: Self-pay

## 2019-03-28 ENCOUNTER — Encounter: Payer: Self-pay | Admitting: Nurse Practitioner

## 2019-03-28 ENCOUNTER — Ambulatory Visit: Payer: Medicare Other | Admitting: Nurse Practitioner

## 2019-03-28 ENCOUNTER — Inpatient Hospital Stay (HOSPITAL_BASED_OUTPATIENT_CLINIC_OR_DEPARTMENT_OTHER): Payer: Medicare Other | Admitting: Nurse Practitioner

## 2019-03-28 VITALS — BP 134/64 | HR 65 | Temp 99.1°F | Resp 17 | Ht 67.0 in | Wt 184.8 lb

## 2019-03-28 DIAGNOSIS — C159 Malignant neoplasm of esophagus, unspecified: Secondary | ICD-10-CM

## 2019-03-28 DIAGNOSIS — Z5111 Encounter for antineoplastic chemotherapy: Secondary | ICD-10-CM | POA: Diagnosis not present

## 2019-03-28 DIAGNOSIS — Z95828 Presence of other vascular implants and grafts: Secondary | ICD-10-CM

## 2019-03-28 LAB — CMP (CANCER CENTER ONLY)
ALT: 17 U/L (ref 0–44)
AST: 27 U/L (ref 15–41)
Albumin: 3 g/dL — ABNORMAL LOW (ref 3.5–5.0)
Alkaline Phosphatase: 154 U/L — ABNORMAL HIGH (ref 38–126)
Anion gap: 7 (ref 5–15)
BUN: 16 mg/dL (ref 8–23)
CO2: 23 mmol/L (ref 22–32)
Calcium: 8.4 mg/dL — ABNORMAL LOW (ref 8.9–10.3)
Chloride: 110 mmol/L (ref 98–111)
Creatinine: 0.96 mg/dL (ref 0.61–1.24)
GFR, Est AFR Am: >60 mL/min (ref >60)
GFR, Estimated: >60 mL/min (ref >60)
Glucose, Bld: 93 mg/dL (ref 70–99)
Potassium: 4.4 mmol/L (ref 3.5–5.1)
Sodium: 140 mmol/L (ref 135–145)
Total Bilirubin: 0.6 mg/dL (ref 0.3–1.2)
Total Protein: 6.2 g/dL — ABNORMAL LOW (ref 6.5–8.1)

## 2019-03-28 LAB — CBC WITH DIFFERENTIAL (CANCER CENTER ONLY)
Abs Immature Granulocytes: 0.03 10*3/uL (ref 0.00–0.07)
Basophils Absolute: 0 10*3/uL (ref 0.0–0.1)
Basophils Relative: 1 %
Eosinophils Absolute: 0.1 10*3/uL (ref 0.0–0.5)
Eosinophils Relative: 2 %
HCT: 26.4 % — ABNORMAL LOW (ref 39.0–52.0)
Hemoglobin: 8.6 g/dL — ABNORMAL LOW (ref 13.0–17.0)
Immature Granulocytes: 1 %
Lymphocytes Relative: 14 %
Lymphs Abs: 0.6 10*3/uL — ABNORMAL LOW (ref 0.7–4.0)
MCH: 30.1 pg (ref 26.0–34.0)
MCHC: 32.6 g/dL (ref 30.0–36.0)
MCV: 92.3 fL (ref 80.0–100.0)
Monocytes Absolute: 0.6 10*3/uL (ref 0.1–1.0)
Monocytes Relative: 15 %
Neutro Abs: 2.6 10*3/uL (ref 1.7–7.7)
Neutrophils Relative %: 67 %
Platelet Count: 92 10*3/uL — ABNORMAL LOW (ref 150–400)
RBC: 2.86 MIL/uL — ABNORMAL LOW (ref 4.22–5.81)
RDW: 16.6 % — ABNORMAL HIGH (ref 11.5–15.5)
WBC Count: 3.9 10*3/uL — ABNORMAL LOW (ref 4.0–10.5)
nRBC: 0 % (ref 0.0–0.2)

## 2019-03-28 LAB — SAMPLE TO BLOOD BANK

## 2019-03-28 MED ORDER — HEPARIN SOD (PORK) LOCK FLUSH 100 UNIT/ML IV SOLN
500.0000 [IU] | Freq: Once | INTRAVENOUS | Status: AC | PRN
  Administered 2019-03-28: 500 [IU]
  Filled 2019-03-28: qty 5

## 2019-03-28 MED ORDER — SODIUM CHLORIDE 0.9% FLUSH
10.0000 mL | INTRAVENOUS | Status: DC | PRN
  Administered 2019-03-28: 10 mL
  Filled 2019-03-28: qty 10

## 2019-03-28 NOTE — Progress Notes (Signed)
[facility] OFFICE PROGRESS NOTE   Diagnosis: Esophagus cancer  INTERVAL HISTORY:   Mr. Hausen returns as scheduled.  He completed cycle 2 FOLFOX 02/28/2019.  Cycle 3 was held on 03/21/2019 due to symptomatic anemia.  He was transfused a unit of blood.  He is feeling better since the blood transfusion.  He denies shortness of breath.  He describes his energy level as "pretty good".  Appetite is okay.  He is able to swallow small pills without difficulty.  He is using a pill cutter for the larger pills.  No nausea or vomiting.  No mouth sores.  No diarrhea.  No numbness or tingling in his hands or feet.  He reports 2 areas in his nose were cauterized last week.  He has had no further epistaxis.  Objective:  Vital signs in last 24 hours:  Blood pressure 134/64, pulse 65, temperature 99.1 F (37.3 C), temperature source Temporal, resp. rate 17, height _0  (1.702 m), weight 184 lb 12.8 oz (83.8 kg), SpO2 98 %.    HEENT: No thrush or ulcers. GI: Abdomen soft and nontender.  No hepatomegaly. Vascular: No leg edema. Neuro: Alert and oriented. Skin: Palms without erythema. Port-A-Cath without erythema.   Lab Results:  Lab Results  Component Value Date   WBC 3.9 (L) 03/28/2019   HGB 8.6 (L) 03/28/2019   HCT 26.4 (L) 03/28/2019   MCV 92.3 03/28/2019   PLT 92 (L) 03/28/2019   NEUTROABS 2.6 03/28/2019    Imaging:  No results found.  Medications: I have reviewed the patient's current medications.  Assessment/Plan: 1. 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 Staging CTs 10/26/2017-asymmetric esophageal thickening, no evidence of metastatic disease ? Endoscopic biopsy 10/20/2017-mid esophagus partially obstructing mass, biopsy confirmed squamous cell carcinoma ? PET scan 11/02/2017-wall thickening in the mid esophagus SUV 27 MA: No findings specific for metastatic disease ? Radiation 11/09/2017-12/17/2017 ? Cycle 1 weekly Taxol/carboplatin 11/10/2017  ? Cycle 2 weekly Taxol/carboplatin 11/17/2017 (carboplatin held due to thrombocytopenia) ? Cycle 3 weekly Taxol/carboplatin 12/08/2017 (dosesreduced due to neutropenia, thrombocytopenia) ? Cycle 4 weekly Taxol/carboplatin 12/15/2017 ? Endoscopy 03/29/2018-no mass seen ? CT chest 12/29/2018-2 cm area associated with anterior wall the proximal esophagus, new-nonspecific, changes of cirrhosis ? PD-L1 CPS 5% ? Cycle 1 FOLFOX 02/07/2019 ? Cycle 2 FOLFOX 03/07/2019, Neulasta ? Cycle 3 FOLFOX 03/29/2019, Neulasta (oxaliplatin dose reduced to 40 mg/m due to thrombocytopenia following cycle 2) 2. Solid dysphagia and odynophagia secondary to #1-improved 3. Coronary artery disease 4. History of prostate cancer 5. COPD 6. Permanent cardiac pacemaker 7. Peripheral vascular disease 8. Chronic thrombocytopenia 9. 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 CT chest 12/28/2017-no evidence of pulmonary emboli. Esophageal thickening consistent with patient's history of esophageal carcinoma. 11. Probable cirrhosis, referred to GI 12. Anemia-progressive compared to when he was here in June, potentially related to GI bleeding or epistaxis. 13. 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 Status post evaluation by [name], not a candidate for vocal fold injection at present. 14. Port-A-Cath placed 02/02/2011 15. Neutropenia secondary to chemotherapy    Disposition: Mr. Soth appears stable.  He has completed 2 cycles of FOLFOX.  Plan to proceed with cycle 3 as scheduled on 03/29/2019.  Oxaliplatin dose reduced to 40 mg/m beginning with cycle 3.  We reviewed the CBC from today.  Counts adequate to proceed with treatment as above.  He will return for lab, follow-up, cycle 4  FOLFOX in 2 weeks.  He will contact the office in the interim with any problems.  Plan reviewed with Dr. Benay Spice.   Ned Card ANP/GNP-BC   03/28/2019  9:32 AM

## 2019-03-29 ENCOUNTER — Telehealth: Payer: Self-pay | Admitting: Oncology

## 2019-03-29 ENCOUNTER — Other Ambulatory Visit: Payer: Self-pay

## 2019-03-29 ENCOUNTER — Inpatient Hospital Stay: Payer: Medicare Other

## 2019-03-29 VITALS — BP 125/62 | HR 65 | Temp 99.1°F | Resp 17

## 2019-03-29 DIAGNOSIS — C159 Malignant neoplasm of esophagus, unspecified: Secondary | ICD-10-CM

## 2019-03-29 DIAGNOSIS — Z5111 Encounter for antineoplastic chemotherapy: Secondary | ICD-10-CM | POA: Diagnosis not present

## 2019-03-29 DIAGNOSIS — C154 Malignant neoplasm of middle third of esophagus: Secondary | ICD-10-CM

## 2019-03-29 MED ORDER — PALONOSETRON HCL INJECTION 0.25 MG/5ML
INTRAVENOUS | Status: AC
  Filled 2019-03-29: qty 5

## 2019-03-29 MED ORDER — PALONOSETRON HCL INJECTION 0.25 MG/5ML
0.2500 mg | Freq: Once | INTRAVENOUS | Status: AC
  Administered 2019-03-29: 0.25 mg via INTRAVENOUS

## 2019-03-29 MED ORDER — DEXTROSE 5 % IV SOLN
Freq: Once | INTRAVENOUS | Status: AC
  Filled 2019-03-29: qty 250

## 2019-03-29 MED ORDER — SODIUM CHLORIDE 0.9 % IV SOLN
2000.0000 mg/m2 | INTRAVENOUS | Status: DC
  Administered 2019-03-29: 4050 mg via INTRAVENOUS
  Filled 2019-03-29: qty 81

## 2019-03-29 MED ORDER — OXALIPLATIN CHEMO INJECTION 100 MG/20ML
40.0000 mg/m2 | Freq: Once | INTRAVENOUS | Status: AC
  Administered 2019-03-29: 80 mg via INTRAVENOUS
  Filled 2019-03-29: qty 16

## 2019-03-29 MED ORDER — DEXAMETHASONE SODIUM PHOSPHATE 10 MG/ML IJ SOLN
10.0000 mg | Freq: Once | INTRAMUSCULAR | Status: AC
  Administered 2019-03-29: 10 mg via INTRAVENOUS

## 2019-03-29 MED ORDER — LEUCOVORIN CALCIUM INJECTION 350 MG
300.0000 mg/m2 | Freq: Once | INTRAVENOUS | Status: AC
  Administered 2019-03-29: 606 mg via INTRAVENOUS
  Filled 2019-03-29: qty 30.3

## 2019-03-29 MED ORDER — DEXAMETHASONE SODIUM PHOSPHATE 10 MG/ML IJ SOLN
INTRAMUSCULAR | Status: AC
  Filled 2019-03-29: qty 1

## 2019-03-29 NOTE — Progress Notes (Signed)
Per 03/28/19 progress note from Ned Card, NP okay to continue with D1C3 treatment using labs from 03/28/19.

## 2019-03-29 NOTE — Patient Instructions (Signed)
[facility] Discharge Instructions for Patients Receiving Chemotherapy  Today you received the following chemotherapy agents: Oxaliplatin/Leucovorin/Adrucil.  To help prevent nausea and vomiting after your treatment, we encourage you to take your nausea medication as directed.    If you develop nausea and vomiting that is not controlled by your nausea medication, call the clinic.   BELOW ARE SYMPTOMS THAT SHOULD BE REPORTED IMMEDIATELY:  *FEVER GREATER THAN 100.5 F  *CHILLS WITH OR WITHOUT FEVER  NAUSEA AND VOMITING THAT IS NOT CONTROLLED WITH YOUR NAUSEA MEDICATION  *UNUSUAL SHORTNESS OF BREATH  *UNUSUAL BRUISING OR BLEEDING  TENDERNESS IN MOUTH AND THROAT WITH OR WITHOUT PRESENCE OF ULCERS  *URINARY PROBLEMS  *BOWEL PROBLEMS  UNUSUAL RASH Items with * indicate a potential emergency and should be followed up as soon as possible.  Feel free to call the clinic should you have any questions or concerns. The clinic phone number is [phone].  Please show the CHEMO ALERT CARD at check-in to the Emergency Department and triage nurse.   

## 2019-03-29 NOTE — Telephone Encounter (Signed)
Scheduled per los. Called and left msg. Mailed printout  °

## 2019-03-31 ENCOUNTER — Inpatient Hospital Stay: Payer: Medicare Other

## 2019-03-31 ENCOUNTER — Other Ambulatory Visit: Payer: Self-pay

## 2019-03-31 DIAGNOSIS — C159 Malignant neoplasm of esophagus, unspecified: Secondary | ICD-10-CM

## 2019-03-31 DIAGNOSIS — C154 Malignant neoplasm of middle third of esophagus: Secondary | ICD-10-CM

## 2019-03-31 DIAGNOSIS — Z5111 Encounter for antineoplastic chemotherapy: Secondary | ICD-10-CM | POA: Diagnosis not present

## 2019-03-31 MED ORDER — SODIUM CHLORIDE 0.9% FLUSH
10.0000 mL | INTRAVENOUS | Status: DC | PRN
  Administered 2019-03-31: 10 mL
  Filled 2019-03-31: qty 10

## 2019-03-31 MED ORDER — HEPARIN SOD (PORK) LOCK FLUSH 100 UNIT/ML IV SOLN
500.0000 [IU] | Freq: Once | INTRAVENOUS | Status: AC | PRN
  Administered 2019-03-31: 500 [IU]
  Filled 2019-03-31: qty 5

## 2019-03-31 MED ORDER — PEGFILGRASTIM INJECTION 6 MG/0.6ML ~~LOC~~
PREFILLED_SYRINGE | SUBCUTANEOUS | Status: AC
  Filled 2019-03-31: qty 0.6

## 2019-03-31 MED ORDER — PEGFILGRASTIM INJECTION 6 MG/0.6ML ~~LOC~~
6.0000 mg | PREFILLED_SYRINGE | Freq: Once | SUBCUTANEOUS | Status: AC
  Administered 2019-03-31: 6 mg via SUBCUTANEOUS

## 2019-03-31 NOTE — Patient Instructions (Signed)

## 2019-04-10 ENCOUNTER — Other Ambulatory Visit: Payer: Self-pay | Admitting: Oncology

## 2019-04-12 ENCOUNTER — Inpatient Hospital Stay (HOSPITAL_BASED_OUTPATIENT_CLINIC_OR_DEPARTMENT_OTHER): Payer: Medicare Other | Admitting: Oncology

## 2019-04-12 ENCOUNTER — Inpatient Hospital Stay: Payer: Medicare Other

## 2019-04-12 ENCOUNTER — Other Ambulatory Visit: Payer: Self-pay

## 2019-04-12 VITALS — BP 129/71 | HR 80 | Temp 98.9°F | Resp 17 | Ht 67.0 in | Wt 184.4 lb

## 2019-04-12 DIAGNOSIS — C154 Malignant neoplasm of middle third of esophagus: Secondary | ICD-10-CM

## 2019-04-12 DIAGNOSIS — C159 Malignant neoplasm of esophagus, unspecified: Secondary | ICD-10-CM

## 2019-04-12 DIAGNOSIS — Z5111 Encounter for antineoplastic chemotherapy: Secondary | ICD-10-CM | POA: Diagnosis not present

## 2019-04-12 LAB — CBC WITH DIFFERENTIAL (CANCER CENTER ONLY)
Abs Immature Granulocytes: 0.06 10*3/uL (ref 0.00–0.07)
Basophils Absolute: 0 10*3/uL (ref 0.0–0.1)
Basophils Relative: 0 %
Eosinophils Absolute: 0.1 10*3/uL (ref 0.0–0.5)
Eosinophils Relative: 1 %
HCT: 25.6 % — ABNORMAL LOW (ref 39.0–52.0)
Hemoglobin: 8.1 g/dL — ABNORMAL LOW (ref 13.0–17.0)
Immature Granulocytes: 1 %
Lymphocytes Relative: 10 %
Lymphs Abs: 0.6 10*3/uL — ABNORMAL LOW (ref 0.7–4.0)
MCH: 29.9 pg (ref 26.0–34.0)
MCHC: 31.6 g/dL (ref 30.0–36.0)
MCV: 94.5 fL (ref 80.0–100.0)
Monocytes Absolute: 0.6 10*3/uL (ref 0.1–1.0)
Monocytes Relative: 11 %
Neutro Abs: 4.3 10*3/uL (ref 1.7–7.7)
Neutrophils Relative %: 77 %
Platelet Count: 68 10*3/uL — ABNORMAL LOW (ref 150–400)
RBC: 2.71 MIL/uL — ABNORMAL LOW (ref 4.22–5.81)
RDW: 17.2 % — ABNORMAL HIGH (ref 11.5–15.5)
WBC Count: 5.6 10*3/uL (ref 4.0–10.5)
nRBC: 0 % (ref 0.0–0.2)

## 2019-04-12 LAB — CMP (CANCER CENTER ONLY)
ALT: 16 U/L (ref 0–44)
AST: 26 U/L (ref 15–41)
Albumin: 3 g/dL — ABNORMAL LOW (ref 3.5–5.0)
Alkaline Phosphatase: 187 U/L — ABNORMAL HIGH (ref 38–126)
Anion gap: 9 (ref 5–15)
BUN: 18 mg/dL (ref 8–23)
CO2: 22 mmol/L (ref 22–32)
Calcium: 8.5 mg/dL — ABNORMAL LOW (ref 8.9–10.3)
Chloride: 111 mmol/L (ref 98–111)
Creatinine: 0.94 mg/dL (ref 0.61–1.24)
GFR, Est AFR Am: >60 mL/min (ref >60)
GFR, Estimated: >60 mL/min (ref >60)
Glucose, Bld: 148 mg/dL — ABNORMAL HIGH (ref 70–99)
Potassium: 3.9 mmol/L (ref 3.5–5.1)
Sodium: 142 mmol/L (ref 135–145)
Total Bilirubin: 0.6 mg/dL (ref 0.3–1.2)
Total Protein: 6.3 g/dL — ABNORMAL LOW (ref 6.5–8.1)

## 2019-04-12 MED ORDER — LEUCOVORIN CALCIUM INJECTION 350 MG
300.0000 mg/m2 | Freq: Once | INTRAVENOUS | Status: AC
  Administered 2019-04-12: 606 mg via INTRAVENOUS
  Filled 2019-04-12: qty 30.3

## 2019-04-12 MED ORDER — DEXTROSE 5 % IV SOLN
Freq: Once | INTRAVENOUS | Status: AC
  Filled 2019-04-12: qty 250

## 2019-04-12 MED ORDER — PALONOSETRON HCL INJECTION 0.25 MG/5ML
0.2500 mg | Freq: Once | INTRAVENOUS | Status: AC
  Administered 2019-04-12: 0.25 mg via INTRAVENOUS

## 2019-04-12 MED ORDER — OXALIPLATIN CHEMO INJECTION 100 MG/20ML
40.0000 mg/m2 | Freq: Once | INTRAVENOUS | Status: AC
  Administered 2019-04-12: 80 mg via INTRAVENOUS
  Filled 2019-04-12: qty 16

## 2019-04-12 MED ORDER — PALONOSETRON HCL INJECTION 0.25 MG/5ML
INTRAVENOUS | Status: AC
  Filled 2019-04-12: qty 5

## 2019-04-12 MED ORDER — DEXAMETHASONE SODIUM PHOSPHATE 10 MG/ML IJ SOLN
10.0000 mg | Freq: Once | INTRAMUSCULAR | Status: AC
  Administered 2019-04-12: 10 mg via INTRAVENOUS

## 2019-04-12 MED ORDER — SODIUM CHLORIDE 0.9 % IV SOLN
2000.0000 mg/m2 | INTRAVENOUS | Status: DC
  Administered 2019-04-12: 4050 mg via INTRAVENOUS
  Filled 2019-04-12: qty 81

## 2019-04-12 MED ORDER — DEXAMETHASONE SODIUM PHOSPHATE 10 MG/ML IJ SOLN
INTRAMUSCULAR | Status: AC
  Filled 2019-04-12: qty 1

## 2019-04-12 NOTE — Patient Instructions (Signed)
[facility] Discharge Instructions for Patients Receiving Chemotherapy  Today you received the following chemotherapy agents :  Oxaliplatin,  Leucovorin,  Fluorouracil.  To help prevent nausea and vomiting after your treatment, we encourage you to take your nausea medication as prescribed.   If you develop nausea and vomiting that is not controlled by your nausea medication, call the clinic.   BELOW ARE SYMPTOMS THAT SHOULD BE REPORTED IMMEDIATELY:  *FEVER GREATER THAN 100.5 F  *CHILLS WITH OR WITHOUT FEVER  NAUSEA AND VOMITING THAT IS NOT CONTROLLED WITH YOUR NAUSEA MEDICATION  *UNUSUAL SHORTNESS OF BREATH  *UNUSUAL BRUISING OR BLEEDING  TENDERNESS IN MOUTH AND THROAT WITH OR WITHOUT PRESENCE OF ULCERS  *URINARY PROBLEMS  *BOWEL PROBLEMS  UNUSUAL RASH Items with * indicate a potential emergency and should be followed up as soon as possible.  Feel free to call the clinic should you have any questions or concerns. The clinic phone number is [phone].  Please show the CHEMO ALERT CARD at check-in to the Emergency Department and triage nurse.   

## 2019-04-12 NOTE — Progress Notes (Signed)
[place] OFFICE PROGRESS NOTE   Diagnosis: Esophagus cancer  INTERVAL HISTORY:   [name] returns for a scheduled visit.  He completed no cycle of FOLFOX on 03/29/2019.  He tolerated the chemotherapy well.  No mouth sores, nausea/vomiting, or cold sensitivity.  He reports malaise for several days following chemotherapy.  He continues to have intermittent bleeding from the left side of the nose.  He controls bleeding with packing and Afrin.  He is scheduled to see ENT in 2 weeks.  He reports improvement in throat pain and hoarseness since beginning chemotherapy. No bone pain with Neulasta.  Objective:  Vital signs in last 24 hours:  Blood pressure 129/71, pulse 80, temperature 98.9 F (37.2 C), temperature source Temporal, resp. rate 17, height '5\' 7"'  (1.702 m), weight 184 lb 6.4 oz (83.6 kg), SpO2 98 %.   Limited physical examination secondary to distancing with the Covid pandemic HEENT: Oral cavity without thrush or ulcers.  Small amount of dried blood at the left nasal septum.  No active bleeding.  GI: No hepatosplenomegaly, nontender Vascular: No leg edema  Skin: Legs without petechiae or ecchymoses  Portacath/PICC-without erythema  Lab Results:  Lab Results  Component Value Date   WBC 5.6 04/12/2019   HGB 8.1 (L) 04/12/2019   HCT 25.6 (L) 04/12/2019   MCV 94.5 04/12/2019   PLT 68 (L) 04/12/2019   NEUTROABS 4.3 04/12/2019    CMP  Lab Results  Component Value Date   NA 142 04/12/2019   K 3.9 04/12/2019   CL 111 04/12/2019   CO2 22 04/12/2019   GLUCOSE 148 (H) 04/12/2019   BUN 18 04/12/2019   CREATININE 0.94 04/12/2019   CALCIUM 8.5 (L) 04/12/2019   PROT 6.3 (L) 04/12/2019   ALBUMIN 3.0 (L) 04/12/2019   AST 26 04/12/2019   ALT 16 04/12/2019   ALKPHOS 187 (H) 04/12/2019   BILITOT 0.6 04/12/2019   GFRNONAA >60 04/12/2019   GFRAA >60 04/12/2019    Lab Results  Component Value Date   CEA1 2.08 02/07/2019     Medications: I have  reviewed the patient's current medications.   Assessment/Plan: 1. 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 Staging CTs 10/26/2017-asymmetric esophageal thickening, no evidence of metastatic disease ? Endoscopic biopsy 10/20/2017-mid esophagus partially obstructing mass, biopsy confirmed squamous cell carcinoma ? PET scan 11/02/2017-wall thickening in the mid esophagus SUV 27 MA: No findings specific for metastatic disease ? Radiation 11/09/2017-12/17/2017 ? Cycle 1 weekly Taxol/carboplatin 11/10/2017 ? Cycle 2 weekly Taxol/carboplatin 11/17/2017 (carboplatin held due to thrombocytopenia) ? Cycle 3 weekly Taxol/carboplatin 12/08/2017 (dosesreduced due to neutropenia, thrombocytopenia) ? Cycle 4 weekly Taxol/carboplatin 12/15/2017 ? Endoscopy 03/29/2018-no mass seen ? CT chest 12/29/2018-2 cm area associated with anterior wall the proximal esophagus, new-nonspecific, changes of cirrhosis ? PD-L1 CPS 5% ? Cycle 1 FOLFOX 02/07/2019 ? Cycle 2 FOLFOX 03/07/2019, Neulasta ? Cycle 3 FOLFOX 03/29/2019, Neulasta (oxaliplatin dose reduced to 40 mg/m due to thrombocytopenia following cycle 2) ? Cycle 4 FOLFOX 04/12/2019 2. Solid dysphagia and odynophagia secondary to #1-improved 3. Coronary artery disease 4. History of prostate cancer 5. COPD 6. Permanent cardiac pacemaker 7. Peripheral vascular disease 8. Chronic thrombocytopenia 9. 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 CT chest 12/28/2017-no evidence of pulmonary emboli. Esophageal thickening consistent with patient's history of esophageal carcinoma. 11. Probable cirrhosis, referred to GI 12. Anemia-progressive compared to when he was here in June, potentially related to GI bleeding or epistaxis. 13. 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 Status post evaluation by [name], not a candidate for vocal fold  injection at present. 14. Port-A-Cath placed 02/02/2011 15. Neutropenia secondary to chemotherapy    Disposition: [name] appears unchanged.  He has persistent pancytopenia secondary to chemotherapy and cirrhosis.  The platelet count is low, but is at his chronic baseline.  He continues to have intermittent nosebleeding.  I recommended he discontinue aspirin.  I will contact cardiology to be sure they are okay with this.  He will complete another cycle of FOLFOX today.  He will call for increased bleeding or new symptoms.  He is scheduled to see ENT within the next few weeks to evaluate the nosebleeding and hoarseness.  [name] will return for an office visit and cycle 5 FOLFOX in 2 weeks.  He will be scheduled for restaging CTs after cycle 5.  Betsy Coder, MD  04/12/2019  10:10 AM

## 2019-04-12 NOTE — Progress Notes (Signed)
Per Dr. Benay Spice: OK to treat w/platelets 68,000

## 2019-04-14 ENCOUNTER — Inpatient Hospital Stay: Payer: Medicare Other

## 2019-04-14 ENCOUNTER — Other Ambulatory Visit: Payer: Self-pay

## 2019-04-14 VITALS — BP 128/72 | HR 78 | Temp 98.2°F | Resp 18

## 2019-04-14 DIAGNOSIS — C159 Malignant neoplasm of esophagus, unspecified: Secondary | ICD-10-CM

## 2019-04-14 DIAGNOSIS — C154 Malignant neoplasm of middle third of esophagus: Secondary | ICD-10-CM

## 2019-04-14 DIAGNOSIS — Z5111 Encounter for antineoplastic chemotherapy: Secondary | ICD-10-CM | POA: Diagnosis not present

## 2019-04-14 MED ORDER — PEGFILGRASTIM INJECTION 6 MG/0.6ML ~~LOC~~
6.0000 mg | PREFILLED_SYRINGE | Freq: Once | SUBCUTANEOUS | Status: AC
  Administered 2019-04-14: 6 mg via SUBCUTANEOUS

## 2019-04-14 MED ORDER — SODIUM CHLORIDE 0.9% FLUSH
10.0000 mL | INTRAVENOUS | Status: DC | PRN
  Administered 2019-04-14: 10 mL
  Filled 2019-04-14: qty 10

## 2019-04-14 MED ORDER — PEGFILGRASTIM INJECTION 6 MG/0.6ML ~~LOC~~
PREFILLED_SYRINGE | SUBCUTANEOUS | Status: AC
  Filled 2019-04-14: qty 0.6

## 2019-04-14 MED ORDER — HEPARIN SOD (PORK) LOCK FLUSH 100 UNIT/ML IV SOLN
500.0000 [IU] | Freq: Once | INTRAVENOUS | Status: AC | PRN
  Administered 2019-04-14: 500 [IU]
  Filled 2019-04-14: qty 5

## 2019-04-14 NOTE — Patient Instructions (Signed)
Pegfilgrastim injection What is this medicine? PEGFILGRASTIM (PEG fil gra stim) is a long-acting granulocyte colony-stimulating factor that stimulates the growth of neutrophils, a type of white blood cell important in the body's fight against infection. It is used to reduce the incidence of fever and infection in patients with certain types of cancer who are receiving chemotherapy that affects the bone marrow, and to increase survival after being exposed to high doses of radiation. This medicine may be used for other purposes; ask your health care provider or pharmacist if you have questions. COMMON BRAND NAME(S): Fulphila, Neulasta, UDENYCA, Ziextenzo What should I tell my health care provider before I take this medicine? They need to know if you have any of these conditions:  kidney disease  latex allergy  ongoing radiation therapy  sickle cell disease  skin reactions to acrylic adhesives (On-Body Injector only)  an unusual or allergic reaction to pegfilgrastim, filgrastim, other medicines, foods, dyes, or preservatives  pregnant or trying to get pregnant  breast-feeding How should I use this medicine? This medicine is for injection under the skin. If you get this medicine at home, you will be taught how to prepare and give the pre-filled syringe or how to use the On-body Injector. Refer to the patient Instructions for Use for detailed instructions. Use exactly as directed. Tell your healthcare provider immediately if you suspect that the On-body Injector may not have performed as intended or if you suspect the use of the On-body Injector resulted in a missed or partial dose. It is important that you put your used needles and syringes in a special sharps container. Do not put them in a trash can. If you do not have a sharps container, call your pharmacist or healthcare provider to get one. Talk to your pediatrician regarding the use of this medicine in children. While this drug may be  prescribed for selected conditions, precautions do apply. Overdosage: If you think you have taken too much of this medicine contact a poison control center or emergency room at once. NOTE: This medicine is only for you. Do not share this medicine with others. What if I miss a dose? It is important not to miss your dose. Call your doctor or health care professional if you miss your dose. If you miss a dose due to an On-body Injector failure or leakage, a new dose should be administered as soon as possible using a single prefilled syringe for manual use. What may interact with this medicine? Interactions have not been studied. Give your health care provider a list of all the medicines, herbs, non-prescription drugs, or dietary supplements you use. Also tell them if you smoke, drink alcohol, or use illegal drugs. Some items may interact with your medicine. This list may not describe all possible interactions. Give your health care provider a list of all the medicines, herbs, non-prescription drugs, or dietary supplements you use. Also tell them if you smoke, drink alcohol, or use illegal drugs. Some items may interact with your medicine. What should I watch for while using this medicine? You may need blood work done while you are taking this medicine. If you are going to need a MRI, CT scan, or other procedure, tell your doctor that you are using this medicine (On-Body Injector only). What side effects may I notice from receiving this medicine? Side effects that you should report to your doctor or health care professional as soon as possible:  allergic reactions like skin rash, itching or hives, swelling of the   face, lips, or tongue  back pain  dizziness  fever  pain, redness, or irritation at site where injected  pinpoint red spots on the skin  red or dark-brown urine  shortness of breath or breathing problems  stomach or side pain, or pain at the  shoulder  swelling  tiredness  trouble passing urine or change in the amount of urine Side effects that usually do not require medical attention (report to your doctor or health care professional if they continue or are bothersome):  bone pain  muscle pain This list may not describe all possible side effects. Call your doctor for medical advice about side effects. You may report side effects to FDA at 1-800-FDA-1088. Where should I keep my medicine? Keep out of the reach of children. If you are using this medicine at home, you will be instructed on how to store it. Throw away any unused medicine after the expiration date on the label. NOTE: This sheet is a summary. It may not cover all possible information. If you have questions about this medicine, talk to your doctor, pharmacist, or health care provider.  2020 Elsevier/Gold Standard (2017-06-08 16:57:08) Implanted Port Home Guide An implanted port is a device that is placed under the skin. It is usually placed in the chest. The device can be used to give IV medicine, to take blood, or for dialysis. You may have an implanted port if:  You need IV medicine that would be irritating to the small veins in your hands or arms.  You need IV medicines, such as antibiotics, for a long period of time.  You need IV nutrition for a long period of time.  You need dialysis. Having a port means that your health care provider will not need to use the veins in your arms for these procedures. You may have fewer limitations when using a port than you would if you used other types of long-term IVs, and you will likely be able to return to normal activities after your incision heals. An implanted port has two main parts:  Reservoir. The reservoir is the part where a needle is inserted to give medicines or draw blood. The reservoir is round. After it is placed, it appears as a small, raised area under your skin.  Catheter. The catheter is a thin,  flexible tube that connects the reservoir to a vein. Medicine that is inserted into the reservoir goes into the catheter and then into the vein. How is my port accessed? To access your port:  A numbing cream may be placed on the skin over the port site.  Your health care provider will put on a mask and sterile gloves.  The skin over your port will be cleaned carefully with a germ-killing soap and allowed to dry.  Your health care provider will gently pinch the port and insert a needle into it.  Your health care provider will check for a blood return to make sure the port is in the vein and is not clogged.  If your port needs to remain accessed to get medicine continuously (constant infusion), your health care provider will place a clear bandage (dressing) over the needle site. The dressing and needle will need to be changed every week, or as told by your health care provider. What is flushing? Flushing helps keep the port from getting clogged. Follow instructions from your health care provider about how and when to flush the port. Ports are usually flushed with saline solution or a medicine   called heparin. The need for flushing will depend on how the port is used:  If the port is only used from time to time to give medicines or draw blood, the port may need to be flushed: ? Before and after medicines have been given. ? Before and after blood has been drawn. ? As part of routine maintenance. Flushing may be recommended every 4-6 weeks.  If a constant infusion is running, the port may not need to be flushed.  Throw away any syringes in a disposal container that is meant for sharp items (sharps container). You can buy a sharps container from a pharmacy, or you can make one by using an empty hard plastic bottle with a cover. How long will my port stay implanted? The port can stay in for as long as your health care provider thinks it is needed. When it is time for the port to come out, a  surgery will be done to remove it. The surgery will be similar to the procedure that was done to put the port in. Follow these instructions at home:   Flush your port as told by your health care provider.  If you need an infusion over several days, follow instructions from your health care provider about how to take care of your port site. Make sure you: ? Wash your hands with soap and water before you change your dressing. If soap and water are not available, use alcohol-based hand sanitizer. ? Change your dressing as told by your health care provider. ? Place any used dressings or infusion bags into a plastic bag. Throw that bag in the trash. ? Keep the dressing that covers the needle clean and dry. Do not get it wet. ? Do not use scissors or sharp objects near the tube. ? Keep the tube clamped, unless it is being used.  Check your port site every day for signs of infection. Check for: ? Redness, swelling, or pain. ? Fluid or blood. ? Pus or a bad smell.  Protect the skin around the port site. ? Avoid wearing bra straps that rub or irritate the site. ? Protect the skin around your port from seat belts. Place a soft pad over your chest if needed.  Bathe or shower as told by your health care provider. The site may get wet as long as you are not actively receiving an infusion.  Return to your normal activities as told by your health care provider. Ask your health care provider what activities are safe for you.  Carry a medical alert card or wear a medical alert bracelet at all times. This will let health care providers know that you have an implanted port in case of an emergency. Get help right away if:  You have redness, swelling, or pain at the port site.  You have fluid or blood coming from your port site.  You have pus or a bad smell coming from the port site.  You have a fever. Summary  Implanted ports are usually placed in the chest for long-term IV access.  Follow  instructions from your health care provider about flushing the port and changing bandages (dressings).  Take care of the area around your port by avoiding clothing that puts pressure on the area, and by watching for signs of infection.  Protect the skin around your port from seat belts. Place a soft pad over your chest if needed.  Get help right away if you have a fever or you have redness,   swelling, pain, drainage, or a bad smell at the port site. This information is not intended to replace advice given to you by your health care provider. Make sure you discuss any questions you have with your health care provider. Document Released: 03/03/2005 Document Revised: 06/25/2018 Document Reviewed: 04/05/2016 Elsevier Patient Education  2020 [employer].

## 2019-04-20 ENCOUNTER — Ambulatory Visit (HOSPITAL_COMMUNITY)
Admission: RE | Admit: 2019-04-20 | Discharge: 2019-04-20 | Disposition: A | Discharge status: Home / Self Care | Payer: Medicare Other | Source: Ambulatory Visit | Attending: Cardiology | Admitting: Cardiology

## 2019-04-20 ENCOUNTER — Other Ambulatory Visit: Payer: Self-pay

## 2019-04-20 ENCOUNTER — Other Ambulatory Visit (HOSPITAL_COMMUNITY): Payer: Self-pay | Admitting: Cardiovascular Disease

## 2019-04-20 DIAGNOSIS — I714 Abdominal aortic aneurysm, without rupture, unspecified: Secondary | ICD-10-CM

## 2019-04-21 ENCOUNTER — Encounter: Payer: Self-pay | Admitting: *Deleted

## 2019-04-24 ENCOUNTER — Other Ambulatory Visit: Payer: Self-pay | Admitting: Oncology

## 2019-04-26 ENCOUNTER — Inpatient Hospital Stay: Payer: Medicare Other

## 2019-04-26 ENCOUNTER — Inpatient Hospital Stay: Payer: Medicare Other | Attending: Oncology | Admitting: Oncology

## 2019-04-26 ENCOUNTER — Other Ambulatory Visit: Payer: Self-pay

## 2019-04-26 ENCOUNTER — Other Ambulatory Visit: Payer: Self-pay | Admitting: *Deleted

## 2019-04-26 VITALS — BP 130/65 | HR 75 | Temp 99.1°F | Resp 17 | Ht 67.0 in | Wt 183.3 lb

## 2019-04-26 DIAGNOSIS — C154 Malignant neoplasm of middle third of esophagus: Secondary | ICD-10-CM

## 2019-04-26 DIAGNOSIS — D701 Agranulocytosis secondary to cancer chemotherapy: Secondary | ICD-10-CM | POA: Insufficient documentation

## 2019-04-26 DIAGNOSIS — Z5111 Encounter for antineoplastic chemotherapy: Secondary | ICD-10-CM | POA: Diagnosis present

## 2019-04-26 DIAGNOSIS — C159 Malignant neoplasm of esophagus, unspecified: Secondary | ICD-10-CM

## 2019-04-26 DIAGNOSIS — D6481 Anemia due to antineoplastic chemotherapy: Secondary | ICD-10-CM | POA: Diagnosis not present

## 2019-04-26 DIAGNOSIS — D649 Anemia, unspecified: Secondary | ICD-10-CM

## 2019-04-26 LAB — CMP (CANCER CENTER ONLY)
ALT: 19 U/L (ref 0–44)
AST: 29 U/L (ref 15–41)
Albumin: 2.9 g/dL — ABNORMAL LOW (ref 3.5–5.0)
Alkaline Phosphatase: 191 U/L — ABNORMAL HIGH (ref 38–126)
Anion gap: 6 (ref 5–15)
BUN: 17 mg/dL (ref 8–23)
CO2: 23 mmol/L (ref 22–32)
Calcium: 8.1 mg/dL — ABNORMAL LOW (ref 8.9–10.3)
Chloride: 113 mmol/L — ABNORMAL HIGH (ref 98–111)
Creatinine: 1.04 mg/dL (ref 0.61–1.24)
GFR, Est AFR Am: >60 mL/min (ref >60)
GFR, Estimated: >60 mL/min (ref >60)
Glucose, Bld: 158 mg/dL — ABNORMAL HIGH (ref 70–99)
Potassium: 4 mmol/L (ref 3.5–5.1)
Sodium: 142 mmol/L (ref 135–145)
Total Bilirubin: 0.6 mg/dL (ref 0.3–1.2)
Total Protein: 5.9 g/dL — ABNORMAL LOW (ref 6.5–8.1)

## 2019-04-26 LAB — CBC WITH DIFFERENTIAL (CANCER CENTER ONLY)
Abs Immature Granulocytes: 0.03 10*3/uL (ref 0.00–0.07)
Basophils Absolute: 0 10*3/uL (ref 0.0–0.1)
Basophils Relative: 0 %
Eosinophils Absolute: 0.1 10*3/uL (ref 0.0–0.5)
Eosinophils Relative: 1 %
HCT: 23.6 % — ABNORMAL LOW (ref 39.0–52.0)
Hemoglobin: 7.3 g/dL — ABNORMAL LOW (ref 13.0–17.0)
Immature Granulocytes: 1 %
Lymphocytes Relative: 11 %
Lymphs Abs: 0.6 10*3/uL — ABNORMAL LOW (ref 0.7–4.0)
MCH: 29.4 pg (ref 26.0–34.0)
MCHC: 30.9 g/dL (ref 30.0–36.0)
MCV: 95.2 fL (ref 80.0–100.0)
Monocytes Absolute: 0.6 10*3/uL (ref 0.1–1.0)
Monocytes Relative: 12 %
Neutro Abs: 3.8 10*3/uL (ref 1.7–7.7)
Neutrophils Relative %: 75 %
Platelet Count: 69 10*3/uL — ABNORMAL LOW (ref 150–400)
RBC: 2.48 MIL/uL — ABNORMAL LOW (ref 4.22–5.81)
RDW: 17.7 % — ABNORMAL HIGH (ref 11.5–15.5)
WBC Count: 5.1 10*3/uL (ref 4.0–10.5)
nRBC: 0 % (ref 0.0–0.2)

## 2019-04-26 LAB — PREPARE RBC (CROSSMATCH)

## 2019-04-26 LAB — SAMPLE TO BLOOD BANK

## 2019-04-26 MED ORDER — PALONOSETRON HCL INJECTION 0.25 MG/5ML
0.2500 mg | Freq: Once | INTRAVENOUS | Status: AC
  Administered 2019-04-26: 0.25 mg via INTRAVENOUS

## 2019-04-26 MED ORDER — DEXTROSE 5 % IV SOLN
Freq: Once | INTRAVENOUS | Status: AC
  Filled 2019-04-26: qty 250

## 2019-04-26 MED ORDER — OXALIPLATIN CHEMO INJECTION 100 MG/20ML
40.0000 mg/m2 | Freq: Once | INTRAVENOUS | Status: AC
  Administered 2019-04-26: 80 mg via INTRAVENOUS
  Filled 2019-04-26: qty 16

## 2019-04-26 MED ORDER — SODIUM CHLORIDE 0.9 % IV SOLN
2000.0000 mg/m2 | INTRAVENOUS | Status: DC
  Administered 2019-04-26: 4050 mg via INTRAVENOUS
  Filled 2019-04-26: qty 81

## 2019-04-26 MED ORDER — PALONOSETRON HCL INJECTION 0.25 MG/5ML
INTRAVENOUS | Status: AC
  Filled 2019-04-26: qty 5

## 2019-04-26 MED ORDER — LEUCOVORIN CALCIUM INJECTION 350 MG
300.0000 mg/m2 | Freq: Once | INTRAVENOUS | Status: AC
  Administered 2019-04-26: 606 mg via INTRAVENOUS
  Filled 2019-04-26: qty 30.3

## 2019-04-26 MED ORDER — DEXAMETHASONE SODIUM PHOSPHATE 10 MG/ML IJ SOLN
INTRAMUSCULAR | Status: AC
  Filled 2019-04-26: qty 1

## 2019-04-26 MED ORDER — DEXAMETHASONE SODIUM PHOSPHATE 10 MG/ML IJ SOLN
10.0000 mg | Freq: Once | INTRAMUSCULAR | Status: AC
  Administered 2019-04-26: 10 mg via INTRAVENOUS

## 2019-04-26 NOTE — Progress Notes (Signed)
Per Dr. Benay Spice: OK to treat today with platelets 69,000 and Hgb 7.3. Planning on transfusion of 1 unit on day of pump d/c. Scheduling message sent. Will draw sample to blood back today.

## 2019-04-26 NOTE — Progress Notes (Signed)
[place] OFFICE PROGRESS NOTE   Diagnosis: Esophagus cancer  INTERVAL HISTORY:   [name] completed no cycle of FOLFOX on 04/12/2019.  No nausea, mouth sores, diarrhea, or neuropathy symptoms.  He reports improvement in dysphagia since beginning chemotherapy.  He continues to have intermittent nosebleeds.  He had significant nosebleeding last week, but reports no bleeding for the past 4-5 days.  He is scheduled for an ENT evaluation next week.  Objective:  Vital signs in last 24 hours:  Blood pressure 130/65, pulse 75, temperature 99.1 F (37.3 C), temperature source Temporal, resp. rate 17, height '5\' 7"'  (1.702 m), weight 183 lb 4.8 oz (83.1 kg), SpO2 98 %.    HEENT: No thrush or ulcers Resp: Lungs clear bilaterally Cardio: Regular rate and rhythm GI: No hepatosplenomegaly, nontender Vascular: No leg edema    Portacath/PICC-without erythema  Lab Results:  Lab Results  Component Value Date   WBC 5.1 04/26/2019   HGB 7.3 (L) 04/26/2019   HCT 23.6 (L) 04/26/2019   MCV 95.2 04/26/2019   PLT 69 (L) 04/26/2019   NEUTROABS 3.8 04/26/2019    CMP  Lab Results  Component Value Date   NA 142 04/12/2019   K 3.9 04/12/2019   CL 111 04/12/2019   CO2 22 04/12/2019   GLUCOSE 148 (H) 04/12/2019   BUN 18 04/12/2019   CREATININE 0.94 04/12/2019   CALCIUM 8.5 (L) 04/12/2019   PROT 6.3 (L) 04/12/2019   ALBUMIN 3.0 (L) 04/12/2019   AST 26 04/12/2019   ALT 16 04/12/2019   ALKPHOS 187 (H) 04/12/2019   BILITOT 0.6 04/12/2019   GFRNONAA >60 04/12/2019   GFRAA >60 04/12/2019    Lab Results  Component Value Date   CEA1 2.08 02/07/2019    Medications: I have reviewed the patient's current medications.   Assessment/Plan: 1. 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 Staging CTs 10/26/2017-asymmetric esophageal thickening, no evidence of metastatic disease ? Endoscopic biopsy 10/20/2017-mid esophagus partially obstructing mass, biopsy  confirmed squamous cell carcinoma ? PET scan 11/02/2017-wall thickening in the mid esophagus SUV 27 MA: No findings specific for metastatic disease ? Radiation 11/09/2017-12/17/2017 ? Cycle 1 weekly Taxol/carboplatin 11/10/2017 ? Cycle 2 weekly Taxol/carboplatin 11/17/2017 (carboplatin held due to thrombocytopenia) ? Cycle 3 weekly Taxol/carboplatin 12/08/2017 (dosesreduced due to neutropenia, thrombocytopenia) ? Cycle 4 weekly Taxol/carboplatin 12/15/2017 ? Endoscopy 03/29/2018-no mass seen ? CT chest 12/29/2018-2 cm area associated with anterior wall the proximal esophagus, new-nonspecific, changes of cirrhosis ? PD-L1 CPS 5% ? Cycle 1 FOLFOX 02/07/2019 ? Cycle 2 FOLFOX 03/07/2019, Neulasta ? Cycle 3 FOLFOX 03/29/2019, Neulasta (oxaliplatin dose reduced to 40 mg/m due to thrombocytopenia following cycle 2) ? Cycle 4 FOLFOX 04/12/2019 ? Cycle 5 FOLFOX 04/26/2019 2. Solid dysphagia and odynophagia secondary to #1-improved 3. Coronary artery disease 4. History of prostate cancer 5. COPD 6. Permanent cardiac pacemaker 7. Peripheral vascular disease 8. Chronic thrombocytopenia 9. 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 CT chest 12/28/2017-no evidence of pulmonary emboli. Esophageal thickening consistent with patient's history of esophageal carcinoma. 11. Probable cirrhosis, referred to GI 12. Anemia-progressive compared to when he was here in June, potentially related to GI bleeding or epistaxis. 13. 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 Status post evaluation by [name], not a candidate for vocal fold injection at present. 14. Port-A-Cath placed 02/02/2011 15. Neutropenia secondary to chemotherapy   Disposition: Mr. Lacock appears unchanged.  He is tolerating FOLFOX  well.  Clinical status has improved since beginning blocks.  He is scheduled to see ENT next week for  follow-up of hoarseness.  Mr. Mollett will complete another cycle of FOLFOX today.  He will undergo a restaging CT evaluation following this cycle.  He will return for an office visit on 05/10/2018.  He has anemia secondary to chemotherapy and bleeding.  He will receive a red cell transfusion later this week.  Betsy Coder, MD  04/26/2019  10:47 AM

## 2019-04-26 NOTE — Patient Instructions (Addendum)
**Please leave blue bracelet on until after you receive 1 unit of blood on Thursday when you come back for your pump removal. Thank you!**  Coronavirus (COVID-19) Are you at risk?  Are you at risk for the Coronavirus (COVID-19)?  To be considered HIGH RISK for Coronavirus (COVID-19), you have to meet the following criteria:  . Traveled to [place], [place], [place], [place] or [place]; or in the [place] to [place], [place], [place], or [place]; and have fever, cough, and shortness of breath within the last 2 weeks of travel OR . Been in close contact with a person diagnosed with COVID-19 within the last 2 weeks and have fever, cough, and shortness of breath . IF YOU DO NOT MEET THESE CRITERIA, YOU ARE CONSIDERED LOW RISK FOR COVID-19.  What to do if you are HIGH RISK for COVID-19?  [name] If you are having a medical emergency, call 911. . Seek medical care right away. Before you go to a doctor's office, urgent care or emergency department, call ahead and tell them about your recent travel, contact with someone diagnosed with COVID-19, and your symptoms. You should receive instructions from your physician's office regarding next steps of care.  . When you arrive at healthcare provider, tell the healthcare staff immediately you have returned from visiting [place], [place], [place], [place] or [place]; or traveled in the [place] to [place], [place], [place], or [place]; in the last two weeks or you have been in close contact with a person diagnosed with COVID-19 in the last 2 weeks.   . Tell the health care staff about your symptoms: fever, cough and shortness of breath. . After you have been seen by a medical provider, you will be either: o Tested for (COVID-19) and discharged home on quarantine except to seek medical care if symptoms worsen, and asked to  - Stay home and avoid contact with others until you get your results (4-5 days)  - Avoid travel on public transportation if  possible (such as bus, train, or airplane) or o Sent to the Emergency Department by EMS for evaluation, COVID-19 testing, and possible admission depending on your condition and test results.  What to do if you are LOW RISK for COVID-19?  Reduce your risk of any infection by using the same precautions used for avoiding the common cold or flu:  [name] Wash your hands often with soap and warm water for at least 20 seconds.  If soap and water are not readily available, use an alcohol-based hand sanitizer with at least 60% alcohol.  . If coughing or sneezing, cover your mouth and nose by coughing or sneezing into the elbow areas of your shirt or coat, into a tissue or into your sleeve (not your hands). . Avoid shaking hands with others and consider head nods or verbal greetings only. . Avoid touching your eyes, nose, or mouth with unwashed hands.  . Avoid close contact with people who are sick. . Avoid places or events with large numbers of people in one location, like concerts or sporting events. . Carefully consider travel plans you have or are making. . If you are planning any travel outside or inside the [place], visit the CDC's Travelers' Health webpage for the latest health notices. . If you have some symptoms but not all symptoms, continue to monitor at home and seek medical attention if your symptoms worsen. . If you are having a medical emergency, call 911.   ADDITIONAL HEALTHCARE  [place] / e-Visit: [email or web address]         [facility]: [facility]: [id number]                   [facility]: [place] Discharge Instructions for Patients Receiving Chemotherapy  Today you received the following chemotherapy agents: Oxaliplatin (Eloxatin), Leucovorin and Fluorouracil (Adrucil, 5-FU)  To help prevent nausea and vomiting after  your treatment, we encourage you to take your nausea medication as directed.    If you develop nausea and vomiting that is not controlled by your nausea medication, call the clinic.   BELOW ARE SYMPTOMS THAT SHOULD BE REPORTED IMMEDIATELY:  *FEVER GREATER THAN 100.5 F  *CHILLS WITH OR WITHOUT FEVER  NAUSEA AND VOMITING THAT IS NOT CONTROLLED WITH YOUR NAUSEA MEDICATION  *UNUSUAL SHORTNESS OF BREATH  *UNUSUAL BRUISING OR BLEEDING  TENDERNESS IN MOUTH AND THROAT WITH OR WITHOUT PRESENCE OF ULCERS  *URINARY PROBLEMS  *BOWEL PROBLEMS  UNUSUAL RASH Items with * indicate a potential emergency and should be followed up as soon as possible.  Feel free to call the clinic should you have any questions or concerns. The clinic phone number is [phone].  Please show the [place] at check-in to the Emergency Department and triage nurse.

## 2019-04-26 NOTE — Progress Notes (Signed)
Blood bank aware of need for 1 unit blood on 04/28/19. Orders placed.

## 2019-04-27 ENCOUNTER — Telehealth: Payer: Self-pay | Admitting: Oncology

## 2019-04-27 NOTE — Telephone Encounter (Signed)
Scheduled per los. Called and spoke with patient. Confirmed appt 

## 2019-04-28 ENCOUNTER — Inpatient Hospital Stay: Payer: Medicare Other

## 2019-04-28 ENCOUNTER — Other Ambulatory Visit: Payer: Self-pay

## 2019-04-28 VITALS — BP 143/59 | HR 57 | Temp 98.1°F | Resp 18

## 2019-04-28 DIAGNOSIS — D649 Anemia, unspecified: Secondary | ICD-10-CM

## 2019-04-28 DIAGNOSIS — C159 Malignant neoplasm of esophagus, unspecified: Secondary | ICD-10-CM

## 2019-04-28 DIAGNOSIS — Z5111 Encounter for antineoplastic chemotherapy: Secondary | ICD-10-CM | POA: Diagnosis not present

## 2019-04-28 DIAGNOSIS — C154 Malignant neoplasm of middle third of esophagus: Secondary | ICD-10-CM

## 2019-04-28 MED ORDER — SODIUM CHLORIDE 0.9% IV SOLUTION
250.0000 mL | Freq: Once | INTRAVENOUS | Status: AC
  Administered 2019-04-28: 250 mL via INTRAVENOUS
  Filled 2019-04-28: qty 250

## 2019-04-28 MED ORDER — PEGFILGRASTIM INJECTION 6 MG/0.6ML ~~LOC~~
6.0000 mg | PREFILLED_SYRINGE | Freq: Once | SUBCUTANEOUS | Status: AC
  Administered 2019-04-28: 16:00:00 6 mg via SUBCUTANEOUS

## 2019-04-28 MED ORDER — HEPARIN SOD (PORK) LOCK FLUSH 100 UNIT/ML IV SOLN
500.0000 [IU] | Freq: Every day | INTRAVENOUS | Status: AC | PRN
  Administered 2019-04-28: 16:00:00 500 [IU]
  Filled 2019-04-28: qty 5

## 2019-04-28 MED ORDER — SODIUM CHLORIDE 0.9% FLUSH
10.0000 mL | INTRAVENOUS | Status: AC | PRN
  Administered 2019-04-28: 10 mL
  Filled 2019-04-28: qty 10

## 2019-04-28 MED ORDER — PEGFILGRASTIM INJECTION 6 MG/0.6ML ~~LOC~~
PREFILLED_SYRINGE | SUBCUTANEOUS | Status: AC
  Filled 2019-04-28: qty 0.6

## 2019-04-28 NOTE — Patient Instructions (Signed)

## 2019-04-29 LAB — BPAM RBC
Blood Product Expiration Date: 202103052359
ISSUE DATE / TIME: 202102111335
Unit Type and Rh: 6200

## 2019-04-29 LAB — TYPE AND SCREEN
ABO/RH(D): A POS
Antibody Screen: NEGATIVE
Unit division: 0

## 2019-05-03 ENCOUNTER — Other Ambulatory Visit: Payer: Self-pay | Admitting: Physician Assistant

## 2019-05-08 ENCOUNTER — Other Ambulatory Visit: Payer: Self-pay | Admitting: Oncology

## 2019-05-11 ENCOUNTER — Inpatient Hospital Stay: Payer: Medicare Other

## 2019-05-11 ENCOUNTER — Other Ambulatory Visit: Payer: Self-pay

## 2019-05-11 ENCOUNTER — Inpatient Hospital Stay (HOSPITAL_BASED_OUTPATIENT_CLINIC_OR_DEPARTMENT_OTHER): Payer: Medicare Other | Admitting: Oncology

## 2019-05-11 VITALS — BP 130/59 | HR 77 | Temp 99.1°F | Resp 18 | Ht 67.0 in | Wt 182.1 lb

## 2019-05-11 DIAGNOSIS — Z5111 Encounter for antineoplastic chemotherapy: Secondary | ICD-10-CM | POA: Diagnosis not present

## 2019-05-11 DIAGNOSIS — C154 Malignant neoplasm of middle third of esophagus: Secondary | ICD-10-CM

## 2019-05-11 DIAGNOSIS — C159 Malignant neoplasm of esophagus, unspecified: Secondary | ICD-10-CM

## 2019-05-11 DIAGNOSIS — Z95828 Presence of other vascular implants and grafts: Secondary | ICD-10-CM

## 2019-05-11 LAB — CMP (CANCER CENTER ONLY)
ALT: 20 U/L (ref 0–44)
AST: 34 U/L (ref 15–41)
Albumin: 2.9 g/dL — ABNORMAL LOW (ref 3.5–5.0)
Alkaline Phosphatase: 212 U/L — ABNORMAL HIGH (ref 38–126)
Anion gap: 8 (ref 5–15)
BUN: 13 mg/dL (ref 8–23)
CO2: 24 mmol/L (ref 22–32)
Calcium: 8.4 mg/dL — ABNORMAL LOW (ref 8.9–10.3)
Chloride: 112 mmol/L — ABNORMAL HIGH (ref 98–111)
Creatinine: 0.9 mg/dL (ref 0.61–1.24)
GFR, Est AFR Am: >60 mL/min (ref >60)
GFR, Estimated: >60 mL/min (ref >60)
Glucose, Bld: 127 mg/dL — ABNORMAL HIGH (ref 70–99)
Potassium: 4 mmol/L (ref 3.5–5.1)
Sodium: 144 mmol/L (ref 135–145)
Total Bilirubin: 0.8 mg/dL (ref 0.3–1.2)
Total Protein: 6 g/dL — ABNORMAL LOW (ref 6.5–8.1)

## 2019-05-11 LAB — CBC WITH DIFFERENTIAL (CANCER CENTER ONLY)
Abs Immature Granulocytes: 0.07 10*3/uL (ref 0.00–0.07)
Basophils Absolute: 0 10*3/uL (ref 0.0–0.1)
Basophils Relative: 0 %
Eosinophils Absolute: 0 10*3/uL (ref 0.0–0.5)
Eosinophils Relative: 0 %
HCT: 26.8 % — ABNORMAL LOW (ref 39.0–52.0)
Hemoglobin: 8.4 g/dL — ABNORMAL LOW (ref 13.0–17.0)
Immature Granulocytes: 1 %
Lymphocytes Relative: 9 %
Lymphs Abs: 0.6 10*3/uL — ABNORMAL LOW (ref 0.7–4.0)
MCH: 30.7 pg (ref 26.0–34.0)
MCHC: 31.3 g/dL (ref 30.0–36.0)
MCV: 97.8 fL (ref 80.0–100.0)
Monocytes Absolute: 0.7 10*3/uL (ref 0.1–1.0)
Monocytes Relative: 10 %
Neutro Abs: 5.3 10*3/uL (ref 1.7–7.7)
Neutrophils Relative %: 80 %
Platelet Count: 67 10*3/uL — ABNORMAL LOW (ref 150–400)
RBC: 2.74 MIL/uL — ABNORMAL LOW (ref 4.22–5.81)
RDW: 18.4 % — ABNORMAL HIGH (ref 11.5–15.5)
WBC Count: 6.7 10*3/uL (ref 4.0–10.5)
nRBC: 0 % (ref 0.0–0.2)

## 2019-05-11 LAB — SAMPLE TO BLOOD BANK

## 2019-05-11 MED ORDER — DEXTROSE 5 % IV SOLN
Freq: Once | INTRAVENOUS | Status: AC
  Filled 2019-05-11: qty 250

## 2019-05-11 MED ORDER — LEUCOVORIN CALCIUM INJECTION 350 MG
300.0000 mg/m2 | Freq: Once | INTRAVENOUS | Status: AC
  Administered 2019-05-11: 13:00:00 606 mg via INTRAVENOUS
  Filled 2019-05-11: qty 30.3

## 2019-05-11 MED ORDER — DEXAMETHASONE SODIUM PHOSPHATE 10 MG/ML IJ SOLN
INTRAMUSCULAR | Status: AC
  Filled 2019-05-11: qty 1

## 2019-05-11 MED ORDER — OXALIPLATIN CHEMO INJECTION 100 MG/20ML
40.0000 mg/m2 | Freq: Once | INTRAVENOUS | Status: AC
  Administered 2019-05-11: 80 mg via INTRAVENOUS
  Filled 2019-05-11: qty 16

## 2019-05-11 MED ORDER — SODIUM CHLORIDE 0.9% FLUSH
10.0000 mL | INTRAVENOUS | Status: DC | PRN
  Administered 2019-05-11: 10 mL
  Filled 2019-05-11: qty 10

## 2019-05-11 MED ORDER — SODIUM CHLORIDE 0.9 % IV SOLN
2000.0000 mg/m2 | INTRAVENOUS | Status: DC
  Administered 2019-05-11: 15:00:00 4050 mg via INTRAVENOUS
  Filled 2019-05-11: qty 81

## 2019-05-11 MED ORDER — PALONOSETRON HCL INJECTION 0.25 MG/5ML
INTRAVENOUS | Status: AC
  Filled 2019-05-11: qty 5

## 2019-05-11 MED ORDER — DEXAMETHASONE SODIUM PHOSPHATE 10 MG/ML IJ SOLN
10.0000 mg | Freq: Once | INTRAMUSCULAR | Status: AC
  Administered 2019-05-11: 12:00:00 10 mg via INTRAVENOUS

## 2019-05-11 MED ORDER — PALONOSETRON HCL INJECTION 0.25 MG/5ML
0.2500 mg | Freq: Once | INTRAVENOUS | Status: AC
  Administered 2019-05-11: 0.25 mg via INTRAVENOUS

## 2019-05-11 NOTE — Patient Instructions (Signed)
[facility] Discharge Instructions for Patients Receiving Chemotherapy  Today you received the following chemotherapy agents :  Oxaliplatin,  Leucovorin,  Fluorouracil.  To help prevent nausea and vomiting after your treatment, we encourage you to take your nausea medication as prescribed.   If you develop nausea and vomiting that is not controlled by your nausea medication, call the clinic.   BELOW ARE SYMPTOMS THAT SHOULD BE REPORTED IMMEDIATELY:  *FEVER GREATER THAN 100.5 F  *CHILLS WITH OR WITHOUT FEVER  NAUSEA AND VOMITING THAT IS NOT CONTROLLED WITH YOUR NAUSEA MEDICATION  *UNUSUAL SHORTNESS OF BREATH  *UNUSUAL BRUISING OR BLEEDING  TENDERNESS IN MOUTH AND THROAT WITH OR WITHOUT PRESENCE OF ULCERS  *URINARY PROBLEMS  *BOWEL PROBLEMS  UNUSUAL RASH Items with * indicate a potential emergency and should be followed up as soon as possible.  Feel free to call the clinic should you have any questions or concerns. The clinic phone number is [phone].  Please show the CHEMO ALERT CARD at check-in to the Emergency Department and triage nurse.   

## 2019-05-11 NOTE — Progress Notes (Signed)
[facility] [place] OFFICE PROGRESS NOTE   Diagnosis: Esophagus cancer  INTERVAL HISTORY:   [name] completed another cycle of FOLFOX on 04/26/2019.  No nausea, mouth sores, diarrhea, or neuropathy symptoms.  He reports recent increased solid dysphagia, but this has now improved.  Dysphagia has improved compared to prechemotherapy. He saw [name] on 05/04/2019.  The left nasal septum was cauterized.  Bleeding has resolved.  He continues to have hoarseness.  He plans to see [name] on 07/01/2019 to consider vocal cord injection. He received a red cell transfusion on 04/28/2019. Objective:  Vital signs in last 24 hours:  Blood pressure (!) 130/59, pulse 77, temperature 99.1 F (37.3 C), temperature source Temporal, resp. rate 18, height '5\' 7"'  (1.702 m), weight 182 lb 1.6 oz (82.6 kg), SpO2 97 %.    HEENT: No thrush or ulcers, oral cavity without bleeding Resp: Distant breath sounds, no respiratory distress Cardio: Regular rate and rhythm GI: No hepatosplenomegaly, no mass, nontender Vascular: No leg edema   Portacath/PICC-without erythema  Lab Results:  Lab Results  Component Value Date   WBC 6.7 05/11/2019   HGB 8.4 (L) 05/11/2019   HCT 26.8 (L) 05/11/2019   MCV 97.8 05/11/2019   PLT 67 (L) 05/11/2019   NEUTROABS 5.3 05/11/2019    CMP  Lab Results  Component Value Date   NA 144 05/11/2019   K 4.0 05/11/2019   CL 112 (H) 05/11/2019   CO2 24 05/11/2019   GLUCOSE 127 (H) 05/11/2019   BUN 13 05/11/2019   CREATININE 0.90 05/11/2019   CALCIUM 8.4 (L) 05/11/2019   PROT 6.0 (L) 05/11/2019   ALBUMIN 2.9 (L) 05/11/2019   AST 34 05/11/2019   ALT 20 05/11/2019   ALKPHOS 212 (H) 05/11/2019   BILITOT 0.8 05/11/2019   GFRNONAA >60 05/11/2019   GFRAA >60 05/11/2019    Lab Results  Component Value Date   CEA1 2.08 02/07/2019    Medications: I have reviewed the patient's current medications.   Assessment/Plan: 1. Squamous cell carcinoma of the  middle third of the esophagus ? Staging CTs 10/26/2017-asymmetric esophageal thickening, no evidence of metastatic disease ? Endoscopic biopsy 10/20/2017-mid esophagus partially obstructing mass, biopsy confirmed squamous cell carcinoma ? PET scan 11/02/2017-wall thickening in the mid esophagus SUV 27 MA: No findings specific for metastatic disease ? Radiation 11/09/2017-12/17/2017 ? Cycle 1 weekly Taxol/carboplatin 11/10/2017 ? Cycle 2 weekly Taxol/carboplatin 11/17/2017 (carboplatin held due to thrombocytopenia) ? Cycle 3 weekly Taxol/carboplatin 12/08/2017 (dosesreduced due to neutropenia, thrombocytopenia) ? Cycle 4 weekly Taxol/carboplatin 12/15/2017 ? Endoscopy 03/29/2018-no mass seen ? CT chest 12/29/2018-2 cm area associated with anterior wall the proximal esophagus, new-nonspecific, changes of cirrhosis ? PD-L1 CPS 5% ? Cycle 1 FOLFOX 02/07/2019 ? Cycle 2 FOLFOX 03/07/2019, Neulasta ? Cycle 3 FOLFOX 03/29/2019, Neulasta (oxaliplatin dose reduced to 40 mg/m due to thrombocytopenia following cycle 2) ? Cycle 4 FOLFOX 04/12/2019 ? Cycle 5 FOLFOX 04/26/2019 ? Cycle 6 FOLFOX 05/11/2019 2. Solid dysphagia and odynophagia secondary to #1-improved 3. Coronary artery disease 4. History of prostate cancer 5. COPD 6. Permanent cardiac pacemaker 7. Peripheral vascular disease 8. Chronic thrombocytopenia 9. 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 CT chest 12/28/2017-no evidence of pulmonary emboli. Esophageal thickening consistent with patient's history of esophageal carcinoma. 11. Probable cirrhosis, referred to GI 12. Anemia-progressive compared to when he was here in June, potentially related to GI bleeding or epistaxis.  Red cell transfusions 03/21/2019 and 04/28/2019 13. Hoarseness secondary to left vocal cord paralysis, likely  due to recurrent tumor in the mediastinum. Status post evaluation by [name], not a candidate for vocal fold  injection at present. 14. Port-A-Cath placed 02/02/2011 15. Neutropenia secondary to chemotherapy     Disposition: [name] appears unchanged.  Has completed 5 cycles of FOLFOX.  He has tolerated the chemotherapy well.  A restaging CT was ordered for prior to today's visit, but this was not scheduled.  He will complete another cycle of FOLFOX today.  He is scheduled for the CT on 05/20/2019.  He will return for an office visit on 05/25/2019.    Betsy Coder, MD  05/11/2019  11:11 AM

## 2019-05-12 ENCOUNTER — Ambulatory Visit: Payer: Medicare Other | Attending: Internal Medicine

## 2019-05-12 ENCOUNTER — Telehealth: Payer: Self-pay | Admitting: Oncology

## 2019-05-12 DIAGNOSIS — Z23 Encounter for immunization: Secondary | ICD-10-CM | POA: Insufficient documentation

## 2019-05-12 NOTE — Progress Notes (Signed)
   [id number]  Name:  [name].    MRN: [id number] DOB: 1934-12-23  05/12/2019  [name] was observed post Covid-19 immunization for 30 minutes based on pre-vaccination screening without incidence. He was provided with Vaccine Information Sheet and instruction to access the V-Safe system.   [name] was instructed to call 911 with any severe reactions post vaccine: [name] Difficulty breathing  . Swelling of your face and throat  . A fast heartbeat  . A bad rash all over your body  . Dizziness and weakness    Immunizations Administered    Name Date Dose VIS Date Route   Pfizer COVID-19 Vaccine 05/12/2019 10:00 AM 0.3 mL 02/25/2019 Intramuscular   Manufacturer: [place]   Lot: [id number]   [place]: [id number]

## 2019-05-12 NOTE — Telephone Encounter (Signed)
Scheduled per los. Called and spoke with patient. Confirmed appt 

## 2019-05-13 ENCOUNTER — Inpatient Hospital Stay: Payer: Medicare Other

## 2019-05-13 ENCOUNTER — Other Ambulatory Visit: Payer: Self-pay

## 2019-05-13 VITALS — BP 132/62 | HR 72 | Temp 98.2°F | Resp 18

## 2019-05-13 DIAGNOSIS — Z5111 Encounter for antineoplastic chemotherapy: Secondary | ICD-10-CM | POA: Diagnosis not present

## 2019-05-13 DIAGNOSIS — C154 Malignant neoplasm of middle third of esophagus: Secondary | ICD-10-CM

## 2019-05-13 DIAGNOSIS — C159 Malignant neoplasm of esophagus, unspecified: Secondary | ICD-10-CM

## 2019-05-13 MED ORDER — PEGFILGRASTIM INJECTION 6 MG/0.6ML ~~LOC~~
PREFILLED_SYRINGE | SUBCUTANEOUS | Status: AC
  Filled 2019-05-13: qty 0.6

## 2019-05-13 MED ORDER — PEGFILGRASTIM INJECTION 6 MG/0.6ML ~~LOC~~
6.0000 mg | PREFILLED_SYRINGE | Freq: Once | SUBCUTANEOUS | Status: AC
  Administered 2019-05-13: 13:00:00 6 mg via SUBCUTANEOUS

## 2019-05-13 MED ORDER — SODIUM CHLORIDE 0.9% FLUSH
10.0000 mL | INTRAVENOUS | Status: DC | PRN
  Administered 2019-05-13: 13:00:00 10 mL
  Filled 2019-05-13: qty 10

## 2019-05-13 MED ORDER — HEPARIN SOD (PORK) LOCK FLUSH 100 UNIT/ML IV SOLN
500.0000 [IU] | Freq: Once | INTRAVENOUS | Status: AC | PRN
  Administered 2019-05-13: 13:00:00 500 [IU]
  Filled 2019-05-13: qty 5

## 2019-05-13 NOTE — Patient Instructions (Signed)
Pegfilgrastim injection What is this medicine? PEGFILGRASTIM (PEG fil gra stim) is a long-acting granulocyte colony-stimulating factor that stimulates the growth of neutrophils, a type of white blood cell important in the body's fight against infection. It is used to reduce the incidence of fever and infection in patients with certain types of cancer who are receiving chemotherapy that affects the bone marrow, and to increase survival after being exposed to high doses of radiation. This medicine may be used for other purposes; ask your health care provider or pharmacist if you have questions. COMMON BRAND NAME(S): Fulphila, Neulasta, UDENYCA, Ziextenzo What should I tell my health care provider before I take this medicine? They need to know if you have any of these conditions:  kidney disease  latex allergy  ongoing radiation therapy  sickle cell disease  skin reactions to acrylic adhesives (On-Body Injector only)  an unusual or allergic reaction to pegfilgrastim, filgrastim, other medicines, foods, dyes, or preservatives  pregnant or trying to get pregnant  breast-feeding How should I use this medicine? This medicine is for injection under the skin. If you get this medicine at home, you will be taught how to prepare and give the pre-filled syringe or how to use the On-body Injector. Refer to the patient Instructions for Use for detailed instructions. Use exactly as directed. Tell your healthcare provider immediately if you suspect that the On-body Injector may not have performed as intended or if you suspect the use of the On-body Injector resulted in a missed or partial dose. It is important that you put your used needles and syringes in a special sharps container. Do not put them in a trash can. If you do not have a sharps container, call your pharmacist or healthcare provider to get one. Talk to your pediatrician regarding the use of this medicine in children. While this drug may be  prescribed for selected conditions, precautions do apply. Overdosage: If you think you have taken too much of this medicine contact a poison control center or emergency room at once. NOTE: This medicine is only for you. Do not share this medicine with others. What if I miss a dose? It is important not to miss your dose. Call your doctor or health care professional if you miss your dose. If you miss a dose due to an On-body Injector failure or leakage, a new dose should be administered as soon as possible using a single prefilled syringe for manual use. What may interact with this medicine? Interactions have not been studied. Give your health care provider a list of all the medicines, herbs, non-prescription drugs, or dietary supplements you use. Also tell them if you smoke, drink alcohol, or use illegal drugs. Some items may interact with your medicine. This list may not describe all possible interactions. Give your health care provider a list of all the medicines, herbs, non-prescription drugs, or dietary supplements you use. Also tell them if you smoke, drink alcohol, or use illegal drugs. Some items may interact with your medicine. What should I watch for while using this medicine? You may need blood work done while you are taking this medicine. If you are going to need a MRI, CT scan, or other procedure, tell your doctor that you are using this medicine (On-Body Injector only). What side effects may I notice from receiving this medicine? Side effects that you should report to your doctor or health care professional as soon as possible:  allergic reactions like skin rash, itching or hives, swelling of the   face, lips, or tongue  back pain  dizziness  fever  pain, redness, or irritation at site where injected  pinpoint red spots on the skin  red or dark-brown urine  shortness of breath or breathing problems  stomach or side pain, or pain at the  shoulder  swelling  tiredness  trouble passing urine or change in the amount of urine Side effects that usually do not require medical attention (report to your doctor or health care professional if they continue or are bothersome):  bone pain  muscle pain This list may not describe all possible side effects. Call your doctor for medical advice about side effects. You may report side effects to FDA at 1-800-FDA-1088. Where should I keep my medicine? Keep out of the reach of children. If you are using this medicine at home, you will be instructed on how to store it. Throw away any unused medicine after the expiration date on the label. NOTE: This sheet is a summary. It may not cover all possible information. If you have questions about this medicine, talk to your doctor, pharmacist, or health care provider.  2020 Elsevier/Gold Standard (2017-06-08 16:57:08) Implanted Port Home Guide An implanted port is a device that is placed under the skin. It is usually placed in the chest. The device can be used to give IV medicine, to take blood, or for dialysis. You may have an implanted port if:  You need IV medicine that would be irritating to the small veins in your hands or arms.  You need IV medicines, such as antibiotics, for a long period of time.  You need IV nutrition for a long period of time.  You need dialysis. Having a port means that your health care provider will not need to use the veins in your arms for these procedures. You may have fewer limitations when using a port than you would if you used other types of long-term IVs, and you will likely be able to return to normal activities after your incision heals. An implanted port has two main parts:  Reservoir. The reservoir is the part where a needle is inserted to give medicines or draw blood. The reservoir is round. After it is placed, it appears as a small, raised area under your skin.  Catheter. The catheter is a thin,  flexible tube that connects the reservoir to a vein. Medicine that is inserted into the reservoir goes into the catheter and then into the vein. How is my port accessed? To access your port:  A numbing cream may be placed on the skin over the port site.  Your health care provider will put on a mask and sterile gloves.  The skin over your port will be cleaned carefully with a germ-killing soap and allowed to dry.  Your health care provider will gently pinch the port and insert a needle into it.  Your health care provider will check for a blood return to make sure the port is in the vein and is not clogged.  If your port needs to remain accessed to get medicine continuously (constant infusion), your health care provider will place a clear bandage (dressing) over the needle site. The dressing and needle will need to be changed every week, or as told by your health care provider. What is flushing? Flushing helps keep the port from getting clogged. Follow instructions from your health care provider about how and when to flush the port. Ports are usually flushed with saline solution or a medicine   called heparin. The need for flushing will depend on how the port is used:  If the port is only used from time to time to give medicines or draw blood, the port may need to be flushed: ? Before and after medicines have been given. ? Before and after blood has been drawn. ? As part of routine maintenance. Flushing may be recommended every 4-6 weeks.  If a constant infusion is running, the port may not need to be flushed.  Throw away any syringes in a disposal container that is meant for sharp items (sharps container). You can buy a sharps container from a pharmacy, or you can make one by using an empty hard plastic bottle with a cover. How long will my port stay implanted? The port can stay in for as long as your health care provider thinks it is needed. When it is time for the port to come out, a  surgery will be done to remove it. The surgery will be similar to the procedure that was done to put the port in. Follow these instructions at home:   Flush your port as told by your health care provider.  If you need an infusion over several days, follow instructions from your health care provider about how to take care of your port site. Make sure you: ? Wash your hands with soap and water before you change your dressing. If soap and water are not available, use alcohol-based hand sanitizer. ? Change your dressing as told by your health care provider. ? Place any used dressings or infusion bags into a plastic bag. Throw that bag in the trash. ? Keep the dressing that covers the needle clean and dry. Do not get it wet. ? Do not use scissors or sharp objects near the tube. ? Keep the tube clamped, unless it is being used.  Check your port site every day for signs of infection. Check for: ? Redness, swelling, or pain. ? Fluid or blood. ? Pus or a bad smell.  Protect the skin around the port site. ? Avoid wearing bra straps that rub or irritate the site. ? Protect the skin around your port from seat belts. Place a soft pad over your chest if needed.  Bathe or shower as told by your health care provider. The site may get wet as long as you are not actively receiving an infusion.  Return to your normal activities as told by your health care provider. Ask your health care provider what activities are safe for you.  Carry a medical alert card or wear a medical alert bracelet at all times. This will let health care providers know that you have an implanted port in case of an emergency. Get help right away if:  You have redness, swelling, or pain at the port site.  You have fluid or blood coming from your port site.  You have pus or a bad smell coming from the port site.  You have a fever. Summary  Implanted ports are usually placed in the chest for long-term IV access.  Follow  instructions from your health care provider about flushing the port and changing bandages (dressings).  Take care of the area around your port by avoiding clothing that puts pressure on the area, and by watching for signs of infection.  Protect the skin around your port from seat belts. Place a soft pad over your chest if needed.  Get help right away if you have a fever or you have redness,   swelling, pain, drainage, or a bad smell at the port site. This information is not intended to replace advice given to you by your health care provider. Make sure you discuss any questions you have with your health care provider. Document Released: 03/03/2005 Document Revised: 06/25/2018 Document Reviewed: 04/05/2016 Elsevier Patient Education  2020 [employer].

## 2019-05-20 ENCOUNTER — Encounter: Payer: Self-pay | Admitting: Gastroenterology

## 2019-05-20 ENCOUNTER — Ambulatory Visit
Admission: RE | Admit: 2019-05-20 | Discharge: 2019-05-20 | Disposition: A | Discharge status: Home / Self Care | Payer: Medicare Other | Source: Ambulatory Visit | Attending: Oncology | Admitting: Oncology

## 2019-05-20 ENCOUNTER — Ambulatory Visit (INDEPENDENT_AMBULATORY_CARE_PROVIDER_SITE_OTHER): Payer: Medicare Other | Admitting: Gastroenterology

## 2019-05-20 VITALS — BP 112/60 | HR 76 | Temp 98.5°F | Ht 64.0 in | Wt 178.5 lb

## 2019-05-20 DIAGNOSIS — R1310 Dysphagia, unspecified: Secondary | ICD-10-CM | POA: Diagnosis not present

## 2019-05-20 DIAGNOSIS — C154 Malignant neoplasm of middle third of esophagus: Secondary | ICD-10-CM

## 2019-05-20 DIAGNOSIS — C159 Malignant neoplasm of esophagus, unspecified: Secondary | ICD-10-CM | POA: Diagnosis not present

## 2019-05-20 DIAGNOSIS — R1319 Other dysphagia: Secondary | ICD-10-CM

## 2019-05-20 MED ORDER — IOPAMIDOL (ISOVUE-300) INJECTION 61%
75.0000 mL | Freq: Once | INTRAVENOUS | Status: AC | PRN
  Administered 2019-05-20: 75 mL via INTRAVENOUS

## 2019-05-20 NOTE — Progress Notes (Signed)
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 He has undergone additional chemotherapy and has had ENT evaluation, not felt to be a candidate for vocal fold injection.  Was having intermittent nosebleeding, cauterization performed by [name].  [name] tells me that he is doing "pretty good".  He has gotten through further chemotherapy, and he is able to swallow soft foods without coughing.  He is maintaining his nutrition and stable weight.  Unfortunately, he remains quite hoarse and wonders if that will get better.  ROS: Cardiovascular:  no chest pain Respiratory: Dyspnea with exertion.  Finds it hard to get a deep breath (likely related to vocal cord dysfunction)  [name] Past Medical, Family and Social History were reviewed and are on file in the EMR.  Objective:  Med list reviewed  Current Outpatient Medications:  .  acetaminophen (TYLENOL) 325 MG tablet, Take 650 mg by mouth 2 (two) times daily., Disp: , Rfl:  .  atorvastatin (LIPITOR) 40 MG tablet, Take 1 tablet (40 mg total) by mouth daily at 6 PM., Disp: 30 tablet, Rfl: 0 .  HYDROcodone-acetaminophen (HYCET) 7.5-325 mg/15 ml solution, Take 5-10 mLs by mouth every 6 (six) hours as needed for moderate pain., Disp: 120 mL, Rfl: 0 .  lidocaine-prilocaine (EMLA) cream, Apply to port site one hour prior to use, Disp: 30 g, Rfl: 2 .  losartan (COZAAR) 25 MG tablet, TAKE 1 TABLET BY MOUTH DAILY (Patient taking differently: Take 25 mg by mouth at bedtime. ), Disp: 90 tablet, Rfl: 3 .  metoprolol tartrate (LOPRESSOR) 25 MG tablet, TAKE 1 TABLET BY MOUTH TWICE DAILY, Disp: 180 tablet, Rfl: 0 .  nitroGLYCERIN (NITROSTAT) 0.4 MG SL tablet, PLACE 1 TABLET UNDER THE TONGUE EVERY 5 MINUTES AS NEEDED FOR CHEST PAIN UP TO 3  DOSES, IF SYMPTOMS PERSIST CALL 911, Disp: 25 tablet, Rfl: 2 .  NON FORMULARY, Apply 1 application topically See admin instructions. Tridema pain cream: Rub into both shoulders 2 times a day, Disp: , Rfl:  .  pantoprazole (PROTONIX) 40 MG tablet, TAKE 1 TABLET BY MOUTH EVERY DAY (Patient taking differently: Take 40 mg by mouth daily. ), Disp: 90 tablet, Rfl: 1 .  prochlorperazine (COMPAZINE) 5 MG tablet, Take 1 tablet (5 mg total) by mouth every 6 (six) hours as needed for nausea or vomiting., Disp: 30 tablet, Rfl: 2   Vital signs in last 24 hrs: Vitals:   05/20/19 1358  BP: 112/60  Pulse: 76  Temp: 98.5 F (36.9 C)    Physical Exam  Severe hoarseness.  Breathing comfortably at rest.  In good spirits  HEENT: sclera anicteric, oral mucosa moist without lesions  Neck: supple, no thyromegaly, JVD or lymphadenopathy  Cardiac: RRR without murmurs, S1S2 heard, no peripheral edema  Pulm: clear to auscultation bilaterally, normal RR and effort noted  Abdomen: soft, no tenderness, with active bowel sounds. No guarding or palpable hepatosplenomegaly.  Labs:   ___________________________________________ Radiologic studies:  CT scan of the chest performed earlier today, no report available at the time of office visit, but was available later in the day (when this dictation was done)  CLINICAL DATA:  Follow-up esophageal cancer history of aortic valve replacement.   EXAM: CT CHEST WITH CONTRAST   TECHNIQUE: Multidetector CT imaging of the chest was performed during intravenous contrast administration.  CONTRAST:  84mL ISOVUE-300 IOPAMIDOL (ISOVUE-300) INJECTION 61%   COMPARISON:  CT chest 12/29/2018   FINDINGS: Cardiovascular: Post median sternotomy for CABG. Status post trans arterial aortic valve replacement. Signs of aortic atherosclerosis as before. Left-sided Port-A-Cath terminates at the caval to atrial junction. Dual lead pacer device in situ. This enters  via right-sided approach. Central pulmonary arteries are normal   Mediastinum/Nodes: Ill-defined infiltrative process in the superior mediastinum associated with the esophagus measuring 3.3 x 2.6 x 4.0 cm, affecting the esophagus just below the thoracic inlet. This previously measured approximately 1.9 x 0.9 x 2.8 cm cm. No signs of thoracic inlet adenopathy.   No axillary lymphadenopathy.   Scattered small lymph nodes in the chest none with pathologic enlargement.   Lungs/Pleura: Signs of emphysema both paraseptal and centrilobular. Juxtapleural nodularity in the right upper lobe is stable and likely reflects scarring measuring 5 mm. No signs of pleural effusion. Airways are patent.   Upper Abdomen: Nodular hepatic contours. Focal area of enhancement at the periphery of the liver may measure as much as 14 mm, not definitely seen on previous exams, subtle abnormality suggested at the periphery of the liver on prior studies. Signs of portal hypertension in the upper abdomen with portosystemic collaterals including recanalized umbilical vein. Adrenal glands are normal. The kidneys are incompletely imaged.   Musculoskeletal: No signs of acute bone finding or evidence of destructive bone process.   IMPRESSION: 1. Enlarging mass lesion along the upper esophagus as described. Compatible with worsening of known neoplasm. 2. Signs of cirrhosis and portal hypertension with subtle peripheral hepatic abnormality seen on prior studies. Area measuring approximately 14 mm on today's exam may represent a benign vascular shunt lesion at the periphery of the liver though given cirrhotic morphology would suggest multiphase imaging of the liver with CT. 3. Signs of emphysema both paraseptal and centrilobular. 4. Stable 5 mm subpleural nodularity in the right upper lobe likely reflects scarring. 5. Post median sternotomy for CABG with aortic atherosclerosis. 6. Status post trans arterial aortic  valve replacement.   Aortic Atherosclerosis (ICD10-I70.0) and Emphysema (ICD10-J43.9).     Electronically Signed   By: Zetta Bills M.D.   On: 05/20/2019 15:12   ____________________________________________ Other:   _____________________________________________ Assessment & Plan  Assessment: Encounter Diagnoses  Name Primary?  . Esophageal dysphagia Yes  . Squamous cell esophageal cancer (HCC)    He unfortunately has progressive locally advanced and recurrent squamous cell esophageal cancer.  He is due to see Dr. Benay Spice next week for follow-up, I do not know what other therapy may be offered.  No upper endoscopy is warranted since the diagnosis is certain.   Plan: Fortunately, he is able to swallow well enough to maintain nutrition.  PEG tube would not be feasible or warranted with this advanced malignancy.   20 minutes were spent on this encounter (including chart review, history/exam, counseling/coordination of care, and documentation)  [name]

## 2019-05-20 NOTE — Patient Instructions (Signed)
If you are age 83 or older, your body mass index should be between 23-30. Your Body mass index is 30.64 kg/m. If this is out of the aforementioned range listed, please consider follow up with your Primary Care Provider.  If you are age 60 or younger, your body mass index should be between 19-25. Your Body mass index is 30.64 kg/m. If this is out of the aformentioned range listed, please consider follow up with your Primary Care Provider.   Please call to schedule your follow up in 1 year or as needed.

## 2019-05-22 ENCOUNTER — Other Ambulatory Visit: Payer: Self-pay | Admitting: Oncology

## 2019-05-22 DIAGNOSIS — I447 Left bundle-branch block, unspecified: Secondary | ICD-10-CM | POA: Insufficient documentation

## 2019-05-22 DIAGNOSIS — I472 Ventricular tachycardia, unspecified: Secondary | ICD-10-CM | POA: Insufficient documentation

## 2019-05-23 ENCOUNTER — Ambulatory Visit (INDEPENDENT_AMBULATORY_CARE_PROVIDER_SITE_OTHER): Payer: Medicare Other | Admitting: Internal Medicine

## 2019-05-23 ENCOUNTER — Encounter: Payer: Self-pay | Admitting: Internal Medicine

## 2019-05-23 ENCOUNTER — Other Ambulatory Visit: Payer: Self-pay

## 2019-05-23 VITALS — BP 124/62 | HR 65 | Ht 67.0 in | Wt 179.0 lb

## 2019-05-23 DIAGNOSIS — I495 Sick sinus syndrome: Secondary | ICD-10-CM

## 2019-05-23 DIAGNOSIS — I472 Ventricular tachycardia, unspecified: Secondary | ICD-10-CM

## 2019-05-23 DIAGNOSIS — I447 Left bundle-branch block, unspecified: Secondary | ICD-10-CM

## 2019-05-23 DIAGNOSIS — Z45018 Encounter for adjustment and management of other part of cardiac pacemaker: Secondary | ICD-10-CM | POA: Diagnosis not present

## 2019-05-23 DIAGNOSIS — I480 Paroxysmal atrial fibrillation: Secondary | ICD-10-CM

## 2019-05-23 NOTE — Patient Instructions (Signed)

## 2019-05-23 NOTE — Progress Notes (Signed)
Patient Care Team: [name], MD as PCP - General (Family Medicine) Deboraha Sprang, MD as PCP - Cardiology (Cardiology) Deboraha Sprang, MD as Consulting Physician (Cardiology)   HPI  [name] is a 85 y.o. male Seen in follow-up for pacemaker implanted remotely with generator replacement 2011. 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  He has a history of coronary artery disease with prior bypass grafting   Severe AS >>> TAVR with Edwards-Sapien 2/17   His major complaint is fatigue.  He has dysphagia related to his unfortunately now recurring esophageal neoplasm.  Ongoing chemotherapy.  No chest pain or shortness of breath or edema.  No palpitations.   DATE TEST EF   7/14 Myoview  56 % No ischemia  6/19 Echo   45 %   6/19 LHC  LIMA-LAD, SVG-D1, SVG-R1-OM3 patent  SVG-RCA T RCA 95>>0 stent      Date Cr K Hgb  4/15 0.93 4.8   3/17 0.86 4.6   12/19 0.94 4.0 10.4       Past Medical History:  Diagnosis Date  . AAA (abdominal aortic aneurysm) ([place])    a. Last duplex - 3.8cm 01/2013 - due 01/2014.  [name] Acne rosacea   . Alcoholic hepatitis with ascites 08/28/2017  . Anginal pain ([place]) 1996  . Aortic stenosis, mild    Last echo 05/04/12 +LVH  . Arthritis    "shoulders" (09/01/2017)  . Back pain    hx epidural injections  . Baker's cyst    Lt.  [name] CAD (coronary artery disease)    a. s/p MI/PTCA - Cx 1994. b. s/p CABG x5 in 1997. c. Abnl nuc 2003- occ SVG-RCA, patent seq SVG-OM1-dCxOM, patentl LIMA-LAD, patent, SVG-diag. d. Normal nuc 09/2012.  . Carotid artery disease ([place])    a. Duplex 01/2013: mildly abnormal. Mild plaque without significant diameter reduction. Repeat recommended 11/15.  [name] COPD (chronic obstructive pulmonary disease) ([place])   . Dizziness and giddiness 08/04/2013  . GERD (gastroesophageal reflux disease)   . H/O: GI bleed    mild, neg. colonoscopy  . Heart murmur   . HOH (hard of  hearing)   . HTN (hypertension)   . Hyperlipidemia   . Hypertensive heart disease   . Myocardial infarction ([place]) 1994  . OSA on CPAP   . OSA treated with BiPAP 11/20/2015   Severe with [facility] 38/hr  . PAF (paroxysmal atrial fibrillation) ([place])    a. Remote per records.   . Pneumonia    history  . Presence of permanent cardiac pacemaker    DOI 1999 with change out 2006; St Jude  . Prostate cancer ([place]) ~ 2010   a. s/p radical retropubic prostatectomy with bilateral pelvic lymph node dissection  . PVD (peripheral vascular disease) ([place])    a. Rt ext iliac stenosis, last PV cath 2003, moderate stenosis last Lower Ext Dopplers 12/16/11 - Rt. ABI 0.96  Lt ABI 1.0.  . Sick sinus syndrome ([place])    a. placed 1999, gen change 2011 - St. Jude.  . Sinus node dysfunction ([place]) 01/20/2013  . Syncope 11/28/97  . Thrombocytopenia (HCC)    chronic, mild  . Valvular heart disease    a. Echo 04/2012: mild-mod AS, mild AI, mild MR.    Past Surgical History:  Procedure Laterality Date  . APPENDECTOMY    . BIOPSY  10/20/2017   Procedure: BIOPSY;  Surgeon: Doran Stabler, MD;  Location: [facility];  Service: Gastroenterology;;  . CARDIAC CATHETERIZATION  2003   VG to RCA occluded, other grafts patent  . CARDIAC CATHETERIZATION N/A 05/04/2015   Procedure: Right/Left Heart Cath and Coronary/Graft Angiography;  Surgeon: Sherren Mocha, MD;  Location: [facility];  Service: Cardiovascular;  Laterality: N/A;  . CARDIAC CATHETERIZATION  09/01/2017  . CARDIAC VALVE REPLACEMENT    . CATARACT EXTRACTION W/ INTRAOCULAR LENS  IMPLANT, BILATERAL Bilateral   . COLONOSCOPY    . CORONARY ANGIOPLASTY  03/29/92   PTCA to LCX  . CORONARY ARTERY BYPASS GRAFT  1997   LIMA-LAD; VG-diag; seq VG-1st OM & distal LCX; VG-RCA  . CORONARY ATHERECTOMY N/A 09/01/2017   Procedure: CORONARY ATHERECTOMY;  Surgeon: Jettie Booze, MD;  Location: [facility];  Service: Cardiovascular;  Laterality: N/A;  .  CORONARY ATHERECTOMY  10/01/2017  . CORONARY ATHERECTOMY N/A 10/01/2017   Procedure: CORONARY ATHERECTOMY;  Surgeon: Leonie Man, MD;  Location: [facility];  Service: Cardiovascular;  Laterality: N/A;  . ESOPHAGOGASTRODUODENOSCOPY (EGD) WITH PROPOFOL N/A 10/20/2017   Procedure: ESOPHAGOGASTRODUODENOSCOPY (EGD) WITH PROPOFOL;  Surgeon: Doran Stabler, MD;  Location: [facility];  Service: Gastroenterology;  Laterality: N/A;  . ESOPHAGOGASTRODUODENOSCOPY (EGD) WITH PROPOFOL N/A 03/29/2018   Procedure: ESOPHAGOGASTRODUODENOSCOPY (EGD) WITH PROPOFOL;  Surgeon: Doran Stabler, MD;  Location: [facility];  Service: Gastroenterology;  Laterality: N/A;  . INSERT / REPLACE / REMOVE PACEMAKER  11/28/1997   pacesetter--ERI 2011  . IR IMAGING GUIDED PORT INSERTION  02/02/2019  . LEFT HEART CATH N/A 09/01/2017   Procedure: Left Heart Cath;  Surgeon: Jettie Booze, MD;  Location: [facility];  Service: Cardiovascular;  Laterality: N/A;  . LEFT HEART CATH AND CORS/GRAFTS ANGIOGRAPHY N/A 08/28/2017   Procedure: LEFT HEART CATH AND CORS/GRAFTS ANGIOGRAPHY;  Surgeon: Leonie Man, MD;  Location: [facility];  Service: Cardiovascular;  Laterality: N/A;  . PACEMAKER GENERATOR CHANGE  08/15/2009   St. Jude accent  . SUPRAPUBIC PROSTATECTOMY    . TEE WITHOUT CARDIOVERSION N/A 06/12/2015   Procedure: TRANSESOPHAGEAL ECHOCARDIOGRAM (TEE);  Surgeon: Sherren Mocha, MD;  Location: [facility];  Service: Open Heart Surgery;  Laterality: N/A;  . TONSILLECTOMY    . TRANSCATHETER AORTIC VALVE REPLACEMENT, TRANSFEMORAL N/A 06/12/2015   Procedure: TRANSCATHETER AORTIC VALVE REPLACEMENT, TRANSFEMORAL, possible transpical;  Surgeon: Sherren Mocha, MD;  Location: [facility];  Service: Open Heart Surgery;  Laterality: N/A;    Current Outpatient Medications  Medication Sig Dispense Refill  . acetaminophen (TYLENOL) 325 MG tablet Take 650 mg by mouth 2 (two) times daily.    [name] atorvastatin (LIPITOR)  40 MG tablet Take 1 tablet (40 mg total) by mouth daily at 6 PM. 30 tablet 0  . HYDROcodone-acetaminophen (HYCET) 7.5-325 mg/15 ml solution Take 5-10 mLs by mouth every 6 (six) hours as needed for moderate pain. 120 mL 0  . lidocaine-prilocaine (EMLA) cream Apply to port site one hour prior to use 30 g 2  . losartan (COZAAR) 25 MG tablet TAKE 1 TABLET BY MOUTH DAILY (Patient taking differently: Take 25 mg by mouth at bedtime. ) 90 tablet 3  . metoprolol tartrate (LOPRESSOR) 25 MG tablet TAKE 1 TABLET BY MOUTH TWICE DAILY 180 tablet 0  . nitroGLYCERIN (NITROSTAT) 0.4 MG SL tablet PLACE 1 TABLET UNDER THE TONGUE EVERY 5 MINUTES AS NEEDED FOR CHEST PAIN UP TO 3 DOSES, IF SYMPTOMS PERSIST CALL 911 25 tablet 2  . NON FORMULARY Apply 1 application  topically See admin instructions. Tridema pain cream: Rub into both shoulders 2 times a day    . pantoprazole (PROTONIX) 40 MG tablet TAKE 1 TABLET BY MOUTH EVERY DAY (Patient taking differently: Take 40 mg by mouth daily. ) 90 tablet 1  . prochlorperazine (COMPAZINE) 5 MG tablet Take 1 tablet (5 mg total) by mouth every 6 (six) hours as needed for nausea or vomiting. 30 tablet 2   No current facility-administered medications for this visit.    No Known Allergies  Review of Systems negative except from HPI and PMH  Physical Exam BP 124/62   Pulse 65   Ht 5\' 7"  (1.702 m)   Wt 179 lb (81.2 kg)   SpO2 98%   BMI 28.04 kg/m  Well developed and well nourished in no acute distress voice hoarse HENT normal Neck supple with JVP-flat Clear Device pocket well healed; without hematoma or erythema.  There is no tethering  Regular rate and rhythm, no  gallop No murmur Abd-soft with active BS No Clubbing cyanosis  edema Skin-warm and dry A & Oriented  Grossly normal sensory and motor function  ECG sinus @ 65 20/06/48 LBBB     Assessment and  Plan  Aortic stenosis-severe s/p TAVI  Coronary artery disease with prior bypass grafting  Pacemaker-St.  Jude [name] device was interrogated and the information was fully reviewed.  The device was reprogrammed to increase his sinus rate as there was competitive junctional rhythm    .     Sinus node dysfunction  LBBB p TAVR   Hypertension    Ventricular tachycardia   Subclinical Atrial fibrillation present      interval SCAF  No intercurrent Ventricular tachycardia  Without symptoms of ischemia  Unfortunately he has recurrence of his cancer by CT with enlarging of esophageal mass he is to see Dr. Benay Spice on Wednesday   Without symptoms of ischemia

## 2019-05-25 ENCOUNTER — Inpatient Hospital Stay: Payer: Medicare Other

## 2019-05-25 ENCOUNTER — Other Ambulatory Visit: Payer: Self-pay

## 2019-05-25 ENCOUNTER — Inpatient Hospital Stay: Payer: Medicare Other | Attending: Oncology | Admitting: Oncology

## 2019-05-25 VITALS — BP 135/65 | HR 70 | Temp 98.7°F | Resp 17 | Ht 67.0 in | Wt 176.8 lb

## 2019-05-25 DIAGNOSIS — Z79899 Other long term (current) drug therapy: Secondary | ICD-10-CM | POA: Insufficient documentation

## 2019-05-25 DIAGNOSIS — Z95828 Presence of other vascular implants and grafts: Secondary | ICD-10-CM | POA: Diagnosis not present

## 2019-05-25 DIAGNOSIS — Z5112 Encounter for antineoplastic immunotherapy: Secondary | ICD-10-CM | POA: Insufficient documentation

## 2019-05-25 DIAGNOSIS — C159 Malignant neoplasm of esophagus, unspecified: Secondary | ICD-10-CM

## 2019-05-25 DIAGNOSIS — C154 Malignant neoplasm of middle third of esophagus: Secondary | ICD-10-CM | POA: Diagnosis present

## 2019-05-25 LAB — CBC WITH DIFFERENTIAL (CANCER CENTER ONLY)
Abs Immature Granulocytes: 0.03 10*3/uL (ref 0.00–0.07)
Basophils Absolute: 0 10*3/uL (ref 0.0–0.1)
Basophils Relative: 1 %
Eosinophils Absolute: 0 10*3/uL (ref 0.0–0.5)
Eosinophils Relative: 1 %
HCT: 26.7 % — ABNORMAL LOW (ref 39.0–52.0)
Hemoglobin: 8.4 g/dL — ABNORMAL LOW (ref 13.0–17.0)
Immature Granulocytes: 1 %
Lymphocytes Relative: 13 %
Lymphs Abs: 0.6 10*3/uL — ABNORMAL LOW (ref 0.7–4.0)
MCH: 30.5 pg (ref 26.0–34.0)
MCHC: 31.5 g/dL (ref 30.0–36.0)
MCV: 97.1 fL (ref 80.0–100.0)
Monocytes Absolute: 0.6 10*3/uL (ref 0.1–1.0)
Monocytes Relative: 14 %
Neutro Abs: 3.1 10*3/uL (ref 1.7–7.7)
Neutrophils Relative %: 70 %
Platelet Count: 67 10*3/uL — ABNORMAL LOW (ref 150–400)
RBC: 2.75 MIL/uL — ABNORMAL LOW (ref 4.22–5.81)
RDW: 19 % — ABNORMAL HIGH (ref 11.5–15.5)
WBC Count: 4.4 10*3/uL (ref 4.0–10.5)
nRBC: 0 % (ref 0.0–0.2)

## 2019-05-25 LAB — CMP (CANCER CENTER ONLY)
ALT: 19 U/L (ref 0–44)
AST: 34 U/L (ref 15–41)
Albumin: 3 g/dL — ABNORMAL LOW (ref 3.5–5.0)
Alkaline Phosphatase: 218 U/L — ABNORMAL HIGH (ref 38–126)
Anion gap: 10 (ref 5–15)
BUN: 15 mg/dL (ref 8–23)
CO2: 22 mmol/L (ref 22–32)
Calcium: 8.6 mg/dL — ABNORMAL LOW (ref 8.9–10.3)
Chloride: 112 mmol/L — ABNORMAL HIGH (ref 98–111)
Creatinine: 0.88 mg/dL (ref 0.61–1.24)
GFR, Est AFR Am: >60 mL/min (ref >60)
GFR, Estimated: >60 mL/min (ref >60)
Glucose, Bld: 86 mg/dL (ref 70–99)
Potassium: 3.9 mmol/L (ref 3.5–5.1)
Sodium: 144 mmol/L (ref 135–145)
Total Bilirubin: 0.9 mg/dL (ref 0.3–1.2)
Total Protein: 6.2 g/dL — ABNORMAL LOW (ref 6.5–8.1)

## 2019-05-25 LAB — SAMPLE TO BLOOD BANK

## 2019-05-25 LAB — TSH: TSH: 2.083 u[IU]/mL (ref 0.320–4.118)

## 2019-05-25 MED ORDER — HEPARIN SOD (PORK) LOCK FLUSH 100 UNIT/ML IV SOLN
500.0000 [IU] | Freq: Once | INTRAVENOUS | Status: DC | PRN
  Filled 2019-05-25: qty 5

## 2019-05-25 MED ORDER — ALTEPLASE 2 MG IJ SOLR
2.0000 mg | Freq: Once | INTRAMUSCULAR | Status: DC | PRN
  Filled 2019-05-25: qty 2

## 2019-05-25 MED ORDER — SODIUM CHLORIDE 0.9 % IV SOLN
Freq: Once | INTRAVENOUS | Status: AC
  Filled 2019-05-25: qty 250

## 2019-05-25 MED ORDER — HEPARIN SOD (PORK) LOCK FLUSH 100 UNIT/ML IV SOLN
500.0000 [IU] | Freq: Once | INTRAVENOUS | Status: AC | PRN
  Administered 2019-05-25: 500 [IU]
  Filled 2019-05-25: qty 5

## 2019-05-25 MED ORDER — SODIUM CHLORIDE 0.9% FLUSH
10.0000 mL | INTRAVENOUS | Status: DC | PRN
  Administered 2019-05-25: 10 mL
  Filled 2019-05-25: qty 10

## 2019-05-25 MED ORDER — SODIUM CHLORIDE 0.9 % IV SOLN
240.0000 mg | Freq: Once | INTRAVENOUS | Status: AC
  Administered 2019-05-25: 13:00:00 240 mg via INTRAVENOUS
  Filled 2019-05-25: qty 24

## 2019-05-25 MED ORDER — SODIUM CHLORIDE 0.9% FLUSH
10.0000 mL | INTRAVENOUS | Status: DC | PRN
  Administered 2019-05-25: 14:00:00 10 mL
  Filled 2019-05-25: qty 10

## 2019-05-25 NOTE — Patient Instructions (Signed)
Nivolumab injection What is this medicine? NIVOLUMAB (nye VOL ue mab) is a monoclonal antibody. It is used to treat colon cancer, esophageal cancer, head and neck cancer, Hodgkin lymphoma, kidney cancer, liver cancer, lung cancer, mesothelioma, melanoma, and urothelial cancer. This medicine may be used for other purposes; ask your health care provider or pharmacist if you have questions. COMMON BRAND NAME(S): Opdivo What should I tell my health care provider before I take this medicine? They need to know if you have any of these conditions:  diabetes  immune system problems  kidney disease  liver disease  lung disease  organ transplant  stomach or intestine problems  thyroid disease  an unusual or allergic reaction to nivolumab, other medicines, foods, dyes, or preservatives  pregnant or trying to get pregnant  breast-feeding How should I use this medicine? This medicine is for infusion into a vein. It is given by a health care professional in a hospital or clinic setting. A special MedGuide will be given to you before each treatment. Be sure to read this information carefully each time. Talk to your pediatrician regarding the use of this medicine in children. While this drug may be prescribed for children as young as 12 years for selected conditions, precautions do apply. Overdosage: If you think you have taken too much of this medicine contact a poison control center or emergency room at once. NOTE: This medicine is only for you. Do not share this medicine with others. What if I miss a dose? It is important not to miss your dose. Call your doctor or health care professional if you are unable to keep an appointment. What may interact with this medicine? Interactions have not been studied. Give your health care provider a list of all the medicines, herbs, non-prescription drugs, or dietary supplements you use. Also tell them if you smoke, drink alcohol, or use illegal drugs.  Some items may interact with your medicine. This list may not describe all possible interactions. Give your health care provider a list of all the medicines, herbs, non-prescription drugs, or dietary supplements you use. Also tell them if you smoke, drink alcohol, or use illegal drugs. Some items may interact with your medicine. What should I watch for while using this medicine? This drug may make you feel generally unwell. Continue your course of treatment even though you feel ill unless your doctor tells you to stop. You may need blood work done while you are taking this medicine. Do not become pregnant while taking this medicine or for 5 months after stopping it. Women should inform their doctor if they wish to become pregnant or think they might be pregnant. There is a potential for serious side effects to an unborn child. Talk to your health care professional or pharmacist for more information. Do not breast-feed an infant while taking this medicine or for 5 months after stopping it. What side effects may I notice from receiving this medicine? Side effects that you should report to your doctor or health care professional as soon as possible:  allergic reactions like skin rash, itching or hives, swelling of the face, lips, or tongue  breathing problems  blood in the urine  bloody or watery diarrhea or black, tarry stools  changes in emotions or moods  changes in vision  chest pain  cough  dizziness  feeling faint or lightheaded, falls  fever, chills  headache with fever, neck stiffness, confusion, loss of memory, sensitivity to light, hallucination, loss of contact with reality, or   seizures  joint pain  mouth sores  redness, blistering, peeling or loosening of the skin, including inside the mouth  severe muscle pain or weakness  signs and symptoms of high blood sugar such as dizziness; dry mouth; dry skin; fruity breath; nausea; stomach pain; increased hunger or thirst;  increased urination  signs and symptoms of kidney injury like trouble passing urine or change in the amount of urine  signs and symptoms of liver injury like dark yellow or brown urine; general ill feeling or flu-like symptoms; light-colored stools; loss of appetite; nausea; right upper belly pain; unusually weak or tired; yellowing of the eyes or skin  swelling of the ankles, feet, hands  trouble passing urine or change in the amount of urine  unusually weak or tired  weight gain or loss Side effects that usually do not require medical attention (report to your doctor or health care professional if they continue or are bothersome):  bone pain  constipation  decreased appetite  diarrhea  muscle pain  nausea, vomiting  tiredness This list may not describe all possible side effects. Call your doctor for medical advice about side effects. You may report side effects to FDA at 1-800-FDA-1088. Where should I keep my medicine? This drug is given in a hospital or clinic and will not be stored at home. NOTE: This sheet is a summary. It may not cover all possible information. If you have questions about this medicine, talk to your doctor, pharmacist, or health care provider.  2020 Elsevier/Gold Standard (2018-12-21 10:04:50)  

## 2019-05-25 NOTE — Addendum Note (Signed)
Addended by: Betsy Coder B on: 05/25/2019 12:18 PM   Modules accepted: Orders

## 2019-05-25 NOTE — Progress Notes (Signed)
Per Dr. Stevphen Meuse to treat w/platelets 67,000

## 2019-05-25 NOTE — Progress Notes (Signed)
[name] OFFICE PROGRESS NOTE   Diagnosis: Esophagus cancer  INTERVAL HISTORY:   [name] completed another cycle of FOLFOX on 05/11/2019.  No nausea or neuropathy symptoms.  Occasional diarrhea.  He has progressive dysphagia.  He has difficulty swallowing solids and pills.  He is tolerating liquids with some difficulty.  Objective:  Vital signs in last 24 hours:  Blood pressure 135/65, pulse 70, temperature 98.7 F (37.1 C), temperature source Oral, resp. rate 17, height 5' 7" (1.702 m), weight 176 lb 12.8 oz (80.2 kg), SpO2 100 %.     Lymphatics: No cervical, supraclavicular, or axillary nodes Resp: Distant breath sounds, no respiratory distress Cardio: Regular rate and rhythm GI: No hepatosplenomegaly, no mass Vascular: No leg edema   Portacath/PICC-without erythema  Lab Results:  Lab Results  Component Value Date   WBC 4.4 05/25/2019   HGB 8.4 (L) 05/25/2019   HCT 26.7 (L) 05/25/2019   MCV 97.1 05/25/2019   PLT 67 (L) 05/25/2019   NEUTROABS 3.1 05/25/2019    CMP  Lab Results  Component Value Date   NA 144 05/25/2019   K 3.9 05/25/2019   CL 112 (H) 05/25/2019   CO2 22 05/25/2019   GLUCOSE 86 05/25/2019   BUN 15 05/25/2019   CREATININE 0.88 05/25/2019   CALCIUM 8.6 (L) 05/25/2019   PROT 6.2 (L) 05/25/2019   ALBUMIN 3.0 (L) 05/25/2019   AST 34 05/25/2019   ALT 19 05/25/2019   ALKPHOS 218 (H) 05/25/2019   BILITOT 0.9 05/25/2019   GFRNONAA >60 05/25/2019   GFRAA >60 05/25/2019    Lab Results  Component Value Date   CEA1 2.08 02/07/2019     Medications: I have reviewed the patient's current medications.   Assessment/Plan: 1. 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 Staging CTs 10/26/2017-asymmetric esophageal thickening, no evidence of metastatic disease ? Endoscopic biopsy 10/20/2017-mid esophagus partially obstructing mass, biopsy confirmed squamous cell carcinoma ? PET scan 11/02/2017-wall thickening in  the mid esophagus SUV 27 MA: No findings specific for metastatic disease ? Radiation 11/09/2017-12/17/2017 ? Cycle 1 weekly Taxol/carboplatin 11/10/2017 ? Cycle 2 weekly Taxol/carboplatin 11/17/2017 (carboplatin held due to thrombocytopenia) ? Cycle 3 weekly Taxol/carboplatin 12/08/2017 (dosesreduced due to neutropenia, thrombocytopenia) ? Cycle 4 weekly Taxol/carboplatin 12/15/2017 ? Endoscopy 03/29/2018-no mass seen ? CT chest 12/29/2018-2 cm area associated with anterior wall the proximal esophagus, new-nonspecific, changes of cirrhosis ? PD-L1 CPS 5% ? Cycle 1 FOLFOX 02/07/2019 ? Cycle 2 FOLFOX 03/07/2019, Neulasta ? Cycle 3 FOLFOX 03/29/2019, Neulasta (oxaliplatin dose reduced to 40 mg/m due to thrombocytopenia following cycle 2) ? Cycle 4 FOLFOX 04/12/2019 ? Cycle 5 FOLFOX 04/26/2019 ? Cycle 6 FOLFOX 05/11/2019 ? CT chest 05/20/2019-enlargement of mass at the upper esophagus, changes of cirrhosis and emphysema 2. Solid dysphagia and odynophagia secondary to #1-improved 3. Coronary artery disease 4. History of prostate cancer 5. COPD 6. Permanent cardiac pacemaker 7. Peripheral vascular disease 8. Chronic thrombocytopenia 9. 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 CT chest 12/28/2017-no evidence of pulmonary emboli. Esophageal thickening consistent with patient's history of esophageal carcinoma. 11. Probable cirrhosis, referred to GI 12. Anemia-progressive compared to when he was here in June, potentially related to GI bleeding or epistaxis.  Red cell transfusions 03/21/2019 and 04/28/2019 13. 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 Status post evaluation by [name], not a candidate for vocal fold injection at present. 14. Port-A-Cath placed 02/02/2011 15. Neutropenia secondary to  chemotherapy       Disposition: [name] appears unchanged.  He has progressive  dysphagia.  The restaging CT confirms enlargement of the upper chest/esophageal mass.  The mass appears to be compressing the esophagus.  I reviewed the CT images with [name].  FOLFOX will be discontinued.  We discussed treatment options.  We discussed the possibility of local therapy with Dr. Loletha Carrow.  I recommend a trial of nivolumab.  We reviewed potential toxicities associated with the PD1 inhibitors including the chance of an allergic reaction, rash, diarrhea, hepatitis, pneumonitis, hypothyroidism, and other autoimmune toxicities.  He agrees to proceed.  The plan is to begin every 2-week nivolumab.  He will follow up with ENT for management of hoarseness.  Betsy Coder, MD  05/25/2019  11:17 AM

## 2019-05-26 ENCOUNTER — Telehealth: Payer: Self-pay | Admitting: Oncology

## 2019-05-26 NOTE — Telephone Encounter (Signed)
Scheduled per los. Called and spoke with patient. Confirmed appt 

## 2019-05-27 LAB — CUP PACEART INCLINIC DEVICE CHECK
Date Time Interrogation Session: 20210308144946
Implantable Lead Implant Date: 19990914
Implantable Lead Implant Date: 19990914
Implantable Lead Location: 753859
Implantable Lead Location: 753860
Implantable Pulse Generator Implant Date: 20110601
Pulse Gen Model: 2210
Pulse Gen Serial Number: 7130714

## 2019-06-05 ENCOUNTER — Other Ambulatory Visit: Payer: Self-pay | Admitting: Oncology

## 2019-06-07 ENCOUNTER — Ambulatory Visit: Payer: Medicare Other | Attending: Internal Medicine

## 2019-06-07 DIAGNOSIS — Z23 Encounter for immunization: Secondary | ICD-10-CM

## 2019-06-07 NOTE — Progress Notes (Signed)
   [id number]  Name:  [name].    MRN: [id number] DOB: 1934/05/11  06/07/2019  [name] was observed post Covid-19 immunization for 15 minutes without incident. He was provided with Vaccine Information Sheet and instruction to access the V-Safe system.   [name] was instructed to call 911 with any severe reactions post vaccine: [name] Difficulty breathing  . Swelling of face and throat  . A fast heartbeat  . A bad rash all over body  . Dizziness and weakness   Immunizations Administered    Name Date Dose VIS Date Route   Pfizer COVID-19 Vaccine 06/07/2019  8:41 AM 0.3 mL 02/25/2019 Intramuscular   Manufacturer: [place]   Lot: [id number]   [place]: [id number]

## 2019-06-09 ENCOUNTER — Inpatient Hospital Stay (HOSPITAL_BASED_OUTPATIENT_CLINIC_OR_DEPARTMENT_OTHER): Payer: Medicare Other | Admitting: Nurse Practitioner

## 2019-06-09 ENCOUNTER — Encounter: Payer: Self-pay | Admitting: Nurse Practitioner

## 2019-06-09 ENCOUNTER — Inpatient Hospital Stay: Payer: Medicare Other

## 2019-06-09 ENCOUNTER — Other Ambulatory Visit: Payer: Self-pay

## 2019-06-09 VITALS — BP 123/60 | HR 71 | Temp 98.9°F | Resp 17 | Ht 67.0 in | Wt 176.2 lb

## 2019-06-09 DIAGNOSIS — C159 Malignant neoplasm of esophagus, unspecified: Secondary | ICD-10-CM | POA: Diagnosis not present

## 2019-06-09 DIAGNOSIS — Z95828 Presence of other vascular implants and grafts: Secondary | ICD-10-CM

## 2019-06-09 DIAGNOSIS — Z5112 Encounter for antineoplastic immunotherapy: Secondary | ICD-10-CM | POA: Diagnosis not present

## 2019-06-09 LAB — CMP (CANCER CENTER ONLY)
ALT: 17 U/L (ref 0–44)
AST: 41 U/L (ref 15–41)
Albumin: 2.9 g/dL — ABNORMAL LOW (ref 3.5–5.0)
Alkaline Phosphatase: 195 U/L — ABNORMAL HIGH (ref 38–126)
Anion gap: 11 (ref 5–15)
BUN: 19 mg/dL (ref 8–23)
CO2: 20 mmol/L — ABNORMAL LOW (ref 22–32)
Calcium: 8.5 mg/dL — ABNORMAL LOW (ref 8.9–10.3)
Chloride: 111 mmol/L (ref 98–111)
Creatinine: 0.96 mg/dL (ref 0.61–1.24)
GFR, Est AFR Am: >60 mL/min (ref >60)
GFR, Estimated: >60 mL/min (ref >60)
Glucose, Bld: 91 mg/dL (ref 70–99)
Potassium: 4.2 mmol/L (ref 3.5–5.1)
Sodium: 142 mmol/L (ref 135–145)
Total Bilirubin: 0.7 mg/dL (ref 0.3–1.2)
Total Protein: 6.4 g/dL — ABNORMAL LOW (ref 6.5–8.1)

## 2019-06-09 LAB — CBC WITH DIFFERENTIAL (CANCER CENTER ONLY)
Abs Immature Granulocytes: 0.01 10*3/uL (ref 0.00–0.07)
Basophils Absolute: 0 10*3/uL (ref 0.0–0.1)
Basophils Relative: 1 %
Eosinophils Absolute: 0.1 10*3/uL (ref 0.0–0.5)
Eosinophils Relative: 2 %
HCT: 25.6 % — ABNORMAL LOW (ref 39.0–52.0)
Hemoglobin: 8.1 g/dL — ABNORMAL LOW (ref 13.0–17.0)
Immature Granulocytes: 0 %
Lymphocytes Relative: 22 %
Lymphs Abs: 0.8 10*3/uL (ref 0.7–4.0)
MCH: 30.7 pg (ref 26.0–34.0)
MCHC: 31.6 g/dL (ref 30.0–36.0)
MCV: 97 fL (ref 80.0–100.0)
Monocytes Absolute: 0.6 10*3/uL (ref 0.1–1.0)
Monocytes Relative: 17 %
Neutro Abs: 2.1 10*3/uL (ref 1.7–7.7)
Neutrophils Relative %: 58 %
Platelet Count: 79 10*3/uL — ABNORMAL LOW (ref 150–400)
RBC: 2.64 MIL/uL — ABNORMAL LOW (ref 4.22–5.81)
RDW: 17.4 % — ABNORMAL HIGH (ref 11.5–15.5)
WBC Count: 3.6 10*3/uL — ABNORMAL LOW (ref 4.0–10.5)
nRBC: 0 % (ref 0.0–0.2)

## 2019-06-09 MED ORDER — SODIUM CHLORIDE 0.9% FLUSH
10.0000 mL | INTRAVENOUS | Status: DC | PRN
  Administered 2019-06-09: 10 mL
  Filled 2019-06-09: qty 10

## 2019-06-09 MED ORDER — SODIUM CHLORIDE 0.9 % IV SOLN
Freq: Once | INTRAVENOUS | Status: AC
  Filled 2019-06-09: qty 250

## 2019-06-09 MED ORDER — SODIUM CHLORIDE 0.9 % IV SOLN
240.0000 mg | Freq: Once | INTRAVENOUS | Status: AC
  Administered 2019-06-09: 240 mg via INTRAVENOUS
  Filled 2019-06-09: qty 24

## 2019-06-09 MED ORDER — HEPARIN SOD (PORK) LOCK FLUSH 100 UNIT/ML IV SOLN
500.0000 [IU] | Freq: Once | INTRAVENOUS | Status: AC | PRN
  Administered 2019-06-09: 500 [IU]
  Filled 2019-06-09: qty 5

## 2019-06-09 NOTE — Progress Notes (Signed)
[place] OFFICE PROGRESS NOTE   Diagnosis: Esophagus cancer  INTERVAL HISTORY:   [name] returns as scheduled.  He completed cycle 1 nivolumab 05/25/2019.  He reports recent constipation.  He took a laxative and then had diarrhea.  He noted stools were dark.  Bowels now moving regularly, stool brown.  No rash.  He continues to have dysphagia but has noted some improvement over the past 2 days.  He denies nausea.  He is not aware of any bleeding.  Objective:  Vital signs in last 24 hours:  Blood pressure 123/60, pulse 71, temperature 98.9 F (37.2 C), temperature source Temporal, resp. rate 17, height '5\' 7"'  (1.702 m), weight 176 lb 3.2 oz (79.9 kg), SpO2 99 %.    GI: Abdomen soft and nontender.  No hepatosplenomegaly. Vascular: No leg edema. Neuro: Alert and oriented. Skin: No rash. Port-A-Cath without erythema.   Lab Results:  Lab Results  Component Value Date   WBC 3.6 (L) 06/09/2019   HGB 8.1 (L) 06/09/2019   HCT 25.6 (L) 06/09/2019   MCV 97.0 06/09/2019   PLT 79 (L) 06/09/2019   NEUTROABS 2.1 06/09/2019    Imaging:  No results found.  Medications: I have reviewed the patient's current medications.  Assessment/Plan: 1. 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 Staging CTs 10/26/2017-asymmetric esophageal thickening, no evidence of metastatic disease ? Endoscopic biopsy 10/20/2017-mid esophagus partially obstructing mass, biopsy confirmed squamous cell carcinoma ? PET scan 11/02/2017-wall thickening in the mid esophagus SUV 27 MA: No findings specific for metastatic disease ? Radiation 11/09/2017-12/17/2017 ? Cycle 1 weekly Taxol/carboplatin 11/10/2017 ? Cycle 2 weekly Taxol/carboplatin 11/17/2017 (carboplatin held due to thrombocytopenia) ? Cycle 3 weekly Taxol/carboplatin 12/08/2017 (dosesreduced due to neutropenia, thrombocytopenia) ? Cycle 4 weekly Taxol/carboplatin 12/15/2017 ? Endoscopy 03/29/2018-no mass seen ? CT chest  12/29/2018-2 cm area associated with anterior wall the proximal esophagus, new-nonspecific, changes of cirrhosis ? PD-L1 CPS 5% ? Cycle 1 FOLFOX 02/07/2019 ? Cycle 2 FOLFOX 03/07/2019, Neulasta ? Cycle 3 FOLFOX 03/29/2019, Neulasta (oxaliplatin dose reduced to 40 mg/m due to thrombocytopenia following cycle 2) ? Cycle 4 FOLFOX 04/12/2019 ? Cycle 5 FOLFOX 04/26/2019 ? Cycle 6 FOLFOX 05/11/2019 ? CT chest 05/20/2019-enlargement of mass at the upper esophagus, changes of cirrhosis and emphysema ? Cycle 1 nivolumab 05/25/2019 ? Cycle 2 nivolumab 06/09/2019 2. Solid dysphagia and odynophagia secondary to #1 3. Coronary artery disease 4. History of prostate cancer 5. COPD 6. Permanent cardiac pacemaker 7. Peripheral vascular disease 8. Chronic thrombocytopenia 9. 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 CT chest 12/28/2017-no evidence of pulmonary emboli. Esophageal thickening consistent with patient's history of esophageal carcinoma. 11. Probable cirrhosis, referred to GI 12. Anemia-progressive compared to when he was here in June, potentially related to GI bleeding or epistaxis.  Red cell transfusions 03/21/2019 and 04/28/2019 13. 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 Status post evaluation by [name], not a candidate for vocal fold injection at present. 14. Port-A-Cath placed 02/02/2011 15. Neutropenia secondary to chemotherapy   Disposition: Mr. [name] appears unchanged.  He has completed 1 cycle of nivolumab.  Plan to proceed with cycle 2 today as scheduled.  We reviewed the CBC from today.  He has persistent anemia, overall stable.  He has stable thrombocytopenia.  Chemistry panel is pending.  He will return for lab, follow-up, cycle 3 nivolumab in 2 weeks.  He will contact the office in the interim with any  problems.    Ned Card ANP/GNP-BC   06/09/2019  2:44  PM

## 2019-06-09 NOTE — Patient Instructions (Signed)

## 2019-06-09 NOTE — Patient Instructions (Signed)
Nivolumab injection What is this medicine? NIVOLUMAB (nye VOL ue mab) is a monoclonal antibody. It is used to treat colon cancer, esophageal cancer, head and neck cancer, Hodgkin lymphoma, kidney cancer, liver cancer, lung cancer, mesothelioma, melanoma, and urothelial cancer. This medicine may be used for other purposes; ask your health care provider or pharmacist if you have questions. COMMON BRAND NAME(S): Opdivo What should I tell my health care provider before I take this medicine? They need to know if you have any of these conditions:  diabetes  immune system problems  kidney disease  liver disease  lung disease  organ transplant  stomach or intestine problems  thyroid disease  an unusual or allergic reaction to nivolumab, other medicines, foods, dyes, or preservatives  pregnant or trying to get pregnant  breast-feeding How should I use this medicine? This medicine is for infusion into a vein. It is given by a health care professional in a hospital or clinic setting. A special MedGuide will be given to you before each treatment. Be sure to read this information carefully each time. Talk to your pediatrician regarding the use of this medicine in children. While this drug may be prescribed for children as young as 12 years for selected conditions, precautions do apply. Overdosage: If you think you have taken too much of this medicine contact a poison control center or emergency room at once. NOTE: This medicine is only for you. Do not share this medicine with others. What if I miss a dose? It is important not to miss your dose. Call your doctor or health care professional if you are unable to keep an appointment. What may interact with this medicine? Interactions have not been studied. Give your health care provider a list of all the medicines, herbs, non-prescription drugs, or dietary supplements you use. Also tell them if you smoke, drink alcohol, or use illegal drugs.  Some items may interact with your medicine. This list may not describe all possible interactions. Give your health care provider a list of all the medicines, herbs, non-prescription drugs, or dietary supplements you use. Also tell them if you smoke, drink alcohol, or use illegal drugs. Some items may interact with your medicine. What should I watch for while using this medicine? This drug may make you feel generally unwell. Continue your course of treatment even though you feel ill unless your doctor tells you to stop. You may need blood work done while you are taking this medicine. Do not become pregnant while taking this medicine or for 5 months after stopping it. Women should inform their doctor if they wish to become pregnant or think they might be pregnant. There is a potential for serious side effects to an unborn child. Talk to your health care professional or pharmacist for more information. Do not breast-feed an infant while taking this medicine or for 5 months after stopping it. What side effects may I notice from receiving this medicine? Side effects that you should report to your doctor or health care professional as soon as possible:  allergic reactions like skin rash, itching or hives, swelling of the face, lips, or tongue  breathing problems  blood in the urine  bloody or watery diarrhea or black, tarry stools  changes in emotions or moods  changes in vision  chest pain  cough  dizziness  feeling faint or lightheaded, falls  fever, chills  headache with fever, neck stiffness, confusion, loss of memory, sensitivity to light, hallucination, loss of contact with reality, or   seizures  joint pain  mouth sores  redness, blistering, peeling or loosening of the skin, including inside the mouth  severe muscle pain or weakness  signs and symptoms of high blood sugar such as dizziness; dry mouth; dry skin; fruity breath; nausea; stomach pain; increased hunger or thirst;  increased urination  signs and symptoms of kidney injury like trouble passing urine or change in the amount of urine  signs and symptoms of liver injury like dark yellow or brown urine; general ill feeling or flu-like symptoms; light-colored stools; loss of appetite; nausea; right upper belly pain; unusually weak or tired; yellowing of the eyes or skin  swelling of the ankles, feet, hands  trouble passing urine or change in the amount of urine  unusually weak or tired  weight gain or loss Side effects that usually do not require medical attention (report to your doctor or health care professional if they continue or are bothersome):  bone pain  constipation  decreased appetite  diarrhea  muscle pain  nausea, vomiting  tiredness This list may not describe all possible side effects. Call your doctor for medical advice about side effects. You may report side effects to FDA at 1-800-FDA-1088. Where should I keep my medicine? This drug is given in a hospital or clinic and will not be stored at home. NOTE: This sheet is a summary. It may not cover all possible information. If you have questions about this medicine, talk to your doctor, pharmacist, or health care provider.  2020 Elsevier/Gold Standard (2018-12-21 10:04:50)  

## 2019-06-09 NOTE — Progress Notes (Signed)
Okay to treat today with plts 79, per Ned Card.

## 2019-06-13 ENCOUNTER — Telehealth: Payer: Self-pay | Admitting: Oncology

## 2019-06-13 NOTE — Telephone Encounter (Signed)
Scheduled per los. Called and spoke with patient. Confirmed appt 

## 2019-06-16 ENCOUNTER — Ambulatory Visit (INDEPENDENT_AMBULATORY_CARE_PROVIDER_SITE_OTHER): Payer: Medicare Other | Admitting: *Deleted

## 2019-06-16 DIAGNOSIS — Z950 Presence of cardiac pacemaker: Secondary | ICD-10-CM | POA: Diagnosis not present

## 2019-06-16 LAB — CUP PACEART REMOTE DEVICE CHECK
Battery Remaining Longevity: 28 mo
Battery Remaining Percentage: 25 %
Battery Voltage: 2.8 V
Brady Statistic AP VP Percent: 1 %
Brady Statistic AP VS Percent: 38 %
Brady Statistic AS VP Percent: 1 %
Brady Statistic AS VS Percent: 60 %
Brady Statistic RA Percent Paced: 36 %
Brady Statistic RV Percent Paced: 1.3 %
Date Time Interrogation Session: 20210401121032
Implantable Lead Implant Date: 19990914
Implantable Lead Implant Date: 19990914
Implantable Lead Location: 753859
Implantable Lead Location: 753860
Implantable Pulse Generator Implant Date: 20110601
Lead Channel Impedance Value: 330 Ohm
Lead Channel Impedance Value: 450 Ohm
Lead Channel Pacing Threshold Amplitude: 0.75 V
Lead Channel Pacing Threshold Amplitude: 1.625 V
Lead Channel Pacing Threshold Pulse Width: 0.4 ms
Lead Channel Pacing Threshold Pulse Width: 0.7 ms
Lead Channel Sensing Intrinsic Amplitude: 0.4 mV
Lead Channel Sensing Intrinsic Amplitude: 11 mV
Lead Channel Setting Pacing Amplitude: 1.875
Lead Channel Setting Pacing Amplitude: 2 V
Lead Channel Setting Pacing Pulse Width: 0.7 ms
Lead Channel Setting Sensing Sensitivity: 2 mV
Pulse Gen Model: 2210
Pulse Gen Serial Number: 7130714

## 2019-06-17 NOTE — Progress Notes (Signed)
PPM Remote  

## 2019-06-19 ENCOUNTER — Other Ambulatory Visit: Payer: Self-pay | Admitting: Oncology

## 2019-06-20 NOTE — Progress Notes (Signed)
Pharmacist Chemotherapy Monitoring - Follow Up Assessment    I verify that I have reviewed each item in the below checklist:  . Regimen for the patient is scheduled for the appropriate day and plan matches scheduled date. [name] Appropriate non-routine labs are ordered dependent on drug ordered. . If applicable, additional medications reviewed and ordered per protocol based on lifetime cumulative doses and/or treatment regimen.   Plan for follow-up and/or issues identified: No . I-vent associated with next due treatment: No . MD and/or nursing notified: No  [name] [facility] 06/20/2019 12:57 PM

## 2019-06-24 ENCOUNTER — Other Ambulatory Visit: Payer: Self-pay

## 2019-06-24 ENCOUNTER — Encounter: Payer: Self-pay | Admitting: Nurse Practitioner

## 2019-06-24 ENCOUNTER — Inpatient Hospital Stay: Payer: Medicare Other

## 2019-06-24 ENCOUNTER — Inpatient Hospital Stay: Payer: Medicare Other | Attending: Oncology | Admitting: Nurse Practitioner

## 2019-06-24 VITALS — BP 138/72 | HR 84 | Temp 98.7°F | Resp 17 | Ht 67.0 in | Wt 172.1 lb

## 2019-06-24 DIAGNOSIS — Z79899 Other long term (current) drug therapy: Secondary | ICD-10-CM | POA: Diagnosis not present

## 2019-06-24 DIAGNOSIS — C159 Malignant neoplasm of esophagus, unspecified: Secondary | ICD-10-CM

## 2019-06-24 DIAGNOSIS — C154 Malignant neoplasm of middle third of esophagus: Secondary | ICD-10-CM | POA: Insufficient documentation

## 2019-06-24 DIAGNOSIS — Z5112 Encounter for antineoplastic immunotherapy: Secondary | ICD-10-CM | POA: Diagnosis present

## 2019-06-24 DIAGNOSIS — Z95828 Presence of other vascular implants and grafts: Secondary | ICD-10-CM

## 2019-06-24 LAB — CMP (CANCER CENTER ONLY)
ALT: 15 U/L (ref 0–44)
AST: 30 U/L (ref 15–41)
Albumin: 2.8 g/dL — ABNORMAL LOW (ref 3.5–5.0)
Alkaline Phosphatase: 201 U/L — ABNORMAL HIGH (ref 38–126)
Anion gap: 5 (ref 5–15)
BUN: 14 mg/dL (ref 8–23)
CO2: 24 mmol/L (ref 22–32)
Calcium: 8.7 mg/dL — ABNORMAL LOW (ref 8.9–10.3)
Chloride: 111 mmol/L (ref 98–111)
Creatinine: 0.88 mg/dL (ref 0.61–1.24)
GFR, Est AFR Am: >60 mL/min (ref >60)
GFR, Estimated: >60 mL/min (ref >60)
Glucose, Bld: 80 mg/dL (ref 70–99)
Potassium: 4 mmol/L (ref 3.5–5.1)
Sodium: 140 mmol/L (ref 135–145)
Total Bilirubin: 0.7 mg/dL (ref 0.3–1.2)
Total Protein: 6.8 g/dL (ref 6.5–8.1)

## 2019-06-24 LAB — CBC WITH DIFFERENTIAL (CANCER CENTER ONLY)
Abs Immature Granulocytes: 0 10*3/uL (ref 0.00–0.07)
Basophils Absolute: 0 10*3/uL (ref 0.0–0.1)
Basophils Relative: 0 %
Eosinophils Absolute: 0.1 10*3/uL (ref 0.0–0.5)
Eosinophils Relative: 3 %
HCT: 25 % — ABNORMAL LOW (ref 39.0–52.0)
Hemoglobin: 7.9 g/dL — ABNORMAL LOW (ref 13.0–17.0)
Immature Granulocytes: 0 %
Lymphocytes Relative: 15 %
Lymphs Abs: 0.5 10*3/uL — ABNORMAL LOW (ref 0.7–4.0)
MCH: 29.7 pg (ref 26.0–34.0)
MCHC: 31.6 g/dL (ref 30.0–36.0)
MCV: 94 fL (ref 80.0–100.0)
Monocytes Absolute: 0.5 10*3/uL (ref 0.1–1.0)
Monocytes Relative: 14 %
Neutro Abs: 2.2 10*3/uL (ref 1.7–7.7)
Neutrophils Relative %: 68 %
Platelet Count: 70 10*3/uL — ABNORMAL LOW (ref 150–400)
RBC: 2.66 MIL/uL — ABNORMAL LOW (ref 4.22–5.81)
RDW: 15.9 % — ABNORMAL HIGH (ref 11.5–15.5)
WBC Count: 3.2 10*3/uL — ABNORMAL LOW (ref 4.0–10.5)
nRBC: 0 % (ref 0.0–0.2)

## 2019-06-24 MED ORDER — HEPARIN SOD (PORK) LOCK FLUSH 100 UNIT/ML IV SOLN
500.0000 [IU] | Freq: Once | INTRAVENOUS | Status: AC | PRN
  Administered 2019-06-24: 16:00:00 500 [IU]
  Filled 2019-06-24: qty 5

## 2019-06-24 MED ORDER — SODIUM CHLORIDE 0.9 % IV SOLN
240.0000 mg | Freq: Once | INTRAVENOUS | Status: AC
  Administered 2019-06-24: 15:00:00 240 mg via INTRAVENOUS
  Filled 2019-06-24: qty 24

## 2019-06-24 MED ORDER — SODIUM CHLORIDE 0.9% FLUSH
10.0000 mL | INTRAVENOUS | Status: DC | PRN
  Administered 2019-06-24: 10 mL
  Filled 2019-06-24: qty 10

## 2019-06-24 MED ORDER — SODIUM CHLORIDE 0.9 % IV SOLN
Freq: Once | INTRAVENOUS | Status: AC
  Filled 2019-06-24: qty 250

## 2019-06-24 MED ORDER — SODIUM CHLORIDE 0.9% FLUSH
10.0000 mL | INTRAVENOUS | Status: DC | PRN
  Administered 2019-06-24: 16:00:00 10 mL
  Filled 2019-06-24: qty 10

## 2019-06-24 NOTE — Progress Notes (Addendum)
[place] OFFICE PROGRESS NOTE   Diagnosis: Esophagus cancer  INTERVAL HISTORY:   [name] returns as scheduled.  He completed cycle 2 nivolumab 06/09/2019. No rash or diarrhea. He continues to have dysphagia. Over the past 3 to 4 days he has been tolerating liquids and some soft solids. He has pain with swallowing. No fever. He has had headaches intermittently for the past few weeks, occurring on a daily basis. He is taking Tylenol around the clock. On several occasions a headache has woken him from sleep. No falls. Some balance difficulties. No nausea or vomiting.  Objective:  Vital signs in last 24 hours:  Blood pressure 138/72, pulse 84, temperature 98.7 F (37.1 C), temperature source Temporal, resp. rate 17, height '5\' 7"'  (1.702 m), weight 172 lb 1.6 oz (78.1 kg), SpO2 99 %.    HEENT: PERRLA. Sclera anicteric. Resp: Distant breath sounds. Cardio: Regular rate and rhythm. GI: Abdomen soft and nontender. No hepatomegaly. Vascular: No leg edema. Neuro: Alert and oriented. Follows commands. Motor strength 5 out of 5.  Gait normal. Skin: No rash. Port-A-Cath without erythema.  Lab Results:  Lab Results  Component Value Date   WBC 3.2 (L) 06/24/2019   HGB 7.9 (L) 06/24/2019   HCT 25.0 (L) 06/24/2019   MCV 94.0 06/24/2019   PLT 70 (L) 06/24/2019   NEUTROABS 2.2 06/24/2019    Imaging:  No results found.  Medications: I have reviewed the patient's current medications.  Assessment/Plan: 1. 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 Staging CTs 10/26/2017-asymmetric esophageal thickening, no evidence of metastatic disease ? Endoscopic biopsy 10/20/2017-mid esophagus partially obstructing mass, biopsy confirmed squamous cell carcinoma ? PET scan 11/02/2017-wall thickening in the mid esophagus SUV 27 MA: No findings specific for metastatic disease ? Radiation 11/09/2017-12/17/2017 ? Cycle 1 weekly Taxol/carboplatin 11/10/2017 ? Cycle 2  weekly Taxol/carboplatin 11/17/2017 (carboplatin held due to thrombocytopenia) ? Cycle 3 weekly Taxol/carboplatin 12/08/2017 (dosesreduced due to neutropenia, thrombocytopenia) ? Cycle 4 weekly Taxol/carboplatin 12/15/2017 ? Endoscopy 03/29/2018-no mass seen ? CT chest 12/29/2018-2 cm area associated with anterior wall the proximal esophagus, new-nonspecific, changes of cirrhosis ? PD-L1 CPS 5% ? Cycle 1 FOLFOX 02/07/2019 ? Cycle 2 FOLFOX 03/07/2019, Neulasta ? Cycle 3 FOLFOX 03/29/2019, Neulasta (oxaliplatin dose reduced to 40 mg/m due to thrombocytopenia following cycle 2) ? Cycle 4 FOLFOX 04/12/2019 ? Cycle 5 FOLFOX 04/26/2019 ? Cycle 6 FOLFOX 05/11/2019 ? CT chest 05/20/2019-enlargement of mass at the upper esophagus, changes of cirrhosis and emphysema ? Cycle 1 nivolumab 05/25/2019 ? Cycle 2 nivolumab 06/09/2019 ? Cycle 3 nivolumab 06/24/2019 2. Solid dysphagia and odynophagia secondary to #1 3. Coronary artery disease 4. History of prostate cancer 5. COPD 6. Permanent cardiac pacemaker 7. Peripheral vascular disease 8. Chronic thrombocytopenia 9. 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 CT chest 12/28/2017-no evidence of pulmonary emboli. Esophageal thickening consistent with patient's history of esophageal carcinoma. 11. Probable cirrhosis, referred to GI 12. Anemia-progressive compared to when he was here in June, potentially related to GI bleeding or epistaxis. Red cell transfusions 03/21/2019 and 04/28/2019 13. 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 Status post evaluation by [name], not a candidate for vocal fold injection at present. 14. Port-A-Cath placed 02/02/2011 15. Neutropenia secondary to chemotherapy  Disposition: [name] appears unchanged. He has completed two cycles of nivolumab.  Proceed with cycle 3 today as scheduled.  We reviewed the CBC and  chemistry panel from today.  Labs adequate to proceed with treatment.  He has stable persistent pancytopenia.  He understands to contact the office with fever, chills, other signs of infection, bleeding.  He continues to have dysphagia/odynophagia.  He is tolerating liquids and some soft solids.  He will continue this diet.  He understands to contact the office if he is unable to swallow.  He is having periodic headaches.  Plan to monitor for now.  If the headaches persist he will contact the office.  He will return for lab, follow-up, nivolumab in 2 weeks.  He will contact the office in the interim as outlined above or with any other problems.  Patient seen with Dr. Benay Spice.    Ned Card ANP/GNP-BC   06/24/2019  2:10 PM This was a shared visit with Ned Card.  [name] was interviewed and examined.  He is tolerating nivolumab well.  He has persistent dysphagia.  The plan is to schedule a restaging chest CT after 5 treatments with nivolumab.  We will contact radiation oncology to see if he is eligible for additional chest radiation.  He will follow a liquid/mechanical soft diet.  Julieanne Manson, MD

## 2019-06-24 NOTE — Progress Notes (Signed)
Per Ned Card, NP, OK to treat with today's labs.

## 2019-06-24 NOTE — Patient Instructions (Signed)
[facility] Discharge Instructions for Patients Receiving Chemotherapy  Today you received the following chemotherapy agents Opdivo  To help prevent nausea and vomiting after your treatment, we encourage you to take your nausea medication as directed   If you develop nausea and vomiting that is not controlled by your nausea medication, call the clinic.   BELOW ARE SYMPTOMS THAT SHOULD BE REPORTED IMMEDIATELY:  *FEVER GREATER THAN 100.5 F  *CHILLS WITH OR WITHOUT FEVER  NAUSEA AND VOMITING THAT IS NOT CONTROLLED WITH YOUR NAUSEA MEDICATION  *UNUSUAL SHORTNESS OF BREATH  *UNUSUAL BRUISING OR BLEEDING  TENDERNESS IN MOUTH AND THROAT WITH OR WITHOUT PRESENCE OF ULCERS  *URINARY PROBLEMS  *BOWEL PROBLEMS  UNUSUAL RASH Items with * indicate a potential emergency and should be followed up as soon as possible.  Feel free to call the clinic should you have any questions or concerns. The clinic phone number is [phone].  Please show the CHEMO ALERT CARD at check-in to the Emergency Department and triage nurse.   

## 2019-06-29 ENCOUNTER — Other Ambulatory Visit: Payer: Self-pay | Admitting: Nurse Practitioner

## 2019-06-29 DIAGNOSIS — C154 Malignant neoplasm of middle third of esophagus: Secondary | ICD-10-CM

## 2019-06-29 MED ORDER — HYDROCODONE-ACETAMINOPHEN 7.5-325 MG/15ML PO SOLN: 5.0000 mL | Freq: Four times a day (QID) | ORAL | 0 refills | Status: DC | PRN

## 2019-07-01 NOTE — Progress Notes (Signed)
Pharmacist Chemotherapy Monitoring - Follow Up Assessment    I verify that I have reviewed each item in the below checklist:  . Regimen for the patient is scheduled for the appropriate day and plan matches scheduled date. [name] Appropriate non-routine labs are ordered dependent on drug ordered. . If applicable, additional medications reviewed and ordered per protocol based on lifetime cumulative doses and/or treatment regimen.   Plan for follow-up and/or issues identified: No . I-vent associated with next due treatment: No . MD and/or nursing notified: No  [name] 07/01/2019 10:22 AM

## 2019-07-06 ENCOUNTER — Other Ambulatory Visit: Payer: Self-pay | Admitting: Oncology

## 2019-07-07 ENCOUNTER — Inpatient Hospital Stay: Payer: Medicare Other

## 2019-07-07 ENCOUNTER — Inpatient Hospital Stay (HOSPITAL_BASED_OUTPATIENT_CLINIC_OR_DEPARTMENT_OTHER): Payer: Medicare Other | Admitting: Oncology

## 2019-07-07 ENCOUNTER — Other Ambulatory Visit: Payer: Self-pay

## 2019-07-07 ENCOUNTER — Telehealth: Payer: Self-pay | Admitting: Oncology

## 2019-07-07 VITALS — BP 118/64 | HR 69 | Temp 98.3°F | Resp 17 | Ht 67.0 in | Wt 171.7 lb

## 2019-07-07 DIAGNOSIS — C159 Malignant neoplasm of esophagus, unspecified: Secondary | ICD-10-CM

## 2019-07-07 DIAGNOSIS — Z95828 Presence of other vascular implants and grafts: Secondary | ICD-10-CM

## 2019-07-07 DIAGNOSIS — C154 Malignant neoplasm of middle third of esophagus: Secondary | ICD-10-CM

## 2019-07-07 DIAGNOSIS — Z5112 Encounter for antineoplastic immunotherapy: Secondary | ICD-10-CM | POA: Diagnosis not present

## 2019-07-07 LAB — CMP (CANCER CENTER ONLY)
ALT: 15 U/L (ref 0–44)
AST: 29 U/L (ref 15–41)
Albumin: 2.7 g/dL — ABNORMAL LOW (ref 3.5–5.0)
Alkaline Phosphatase: 218 U/L — ABNORMAL HIGH (ref 38–126)
Anion gap: 6 (ref 5–15)
BUN: 19 mg/dL (ref 8–23)
CO2: 23 mmol/L (ref 22–32)
Calcium: 8.8 mg/dL — ABNORMAL LOW (ref 8.9–10.3)
Chloride: 109 mmol/L (ref 98–111)
Creatinine: 1.01 mg/dL (ref 0.61–1.24)
GFR, Est AFR Am: >60 mL/min (ref >60)
GFR, Estimated: >60 mL/min (ref >60)
Glucose, Bld: 129 mg/dL — ABNORMAL HIGH (ref 70–99)
Potassium: 4.1 mmol/L (ref 3.5–5.1)
Sodium: 138 mmol/L (ref 135–145)
Total Bilirubin: 0.7 mg/dL (ref 0.3–1.2)
Total Protein: 6.8 g/dL (ref 6.5–8.1)

## 2019-07-07 LAB — CBC WITH DIFFERENTIAL (CANCER CENTER ONLY)
Abs Immature Granulocytes: 0.01 10*3/uL (ref 0.00–0.07)
Basophils Absolute: 0 10*3/uL (ref 0.0–0.1)
Basophils Relative: 0 %
Eosinophils Absolute: 0.1 10*3/uL (ref 0.0–0.5)
Eosinophils Relative: 2 %
HCT: 25.8 % — ABNORMAL LOW (ref 39.0–52.0)
Hemoglobin: 8 g/dL — ABNORMAL LOW (ref 13.0–17.0)
Immature Granulocytes: 0 %
Lymphocytes Relative: 10 %
Lymphs Abs: 0.5 10*3/uL — ABNORMAL LOW (ref 0.7–4.0)
MCH: 29 pg (ref 26.0–34.0)
MCHC: 31 g/dL (ref 30.0–36.0)
MCV: 93.5 fL (ref 80.0–100.0)
Monocytes Absolute: 0.5 10*3/uL (ref 0.1–1.0)
Monocytes Relative: 10 %
Neutro Abs: 3.8 10*3/uL (ref 1.7–7.7)
Neutrophils Relative %: 78 %
Platelet Count: 70 10*3/uL — ABNORMAL LOW (ref 150–400)
RBC: 2.76 MIL/uL — ABNORMAL LOW (ref 4.22–5.81)
RDW: 15.8 % — ABNORMAL HIGH (ref 11.5–15.5)
WBC Count: 4.8 10*3/uL (ref 4.0–10.5)
nRBC: 0 % (ref 0.0–0.2)

## 2019-07-07 LAB — TSH: TSH: 1.422 u[IU]/mL (ref 0.320–4.118)

## 2019-07-07 MED ORDER — SODIUM CHLORIDE 0.9% FLUSH
10.0000 mL | INTRAVENOUS | Status: DC | PRN
  Filled 2019-07-07: qty 10

## 2019-07-07 MED ORDER — SODIUM CHLORIDE 0.9 % IV SOLN
240.0000 mg | Freq: Once | INTRAVENOUS | Status: AC
  Administered 2019-07-07: 240 mg via INTRAVENOUS
  Filled 2019-07-07: qty 24

## 2019-07-07 MED ORDER — HEPARIN SOD (PORK) LOCK FLUSH 100 UNIT/ML IV SOLN
500.0000 [IU] | Freq: Once | INTRAVENOUS | Status: AC | PRN
  Administered 2019-07-07: 500 [IU]
  Filled 2019-07-07: qty 5

## 2019-07-07 MED ORDER — SODIUM CHLORIDE 0.9 % IV SOLN
Freq: Once | INTRAVENOUS | Status: AC
  Filled 2019-07-07: qty 250

## 2019-07-07 MED ORDER — SODIUM CHLORIDE 0.9% FLUSH
10.0000 mL | INTRAVENOUS | Status: DC | PRN
  Administered 2019-07-07: 10 mL
  Filled 2019-07-07: qty 10

## 2019-07-07 NOTE — Progress Notes (Signed)
[place] OFFICE PROGRESS NOTE   Diagnosis: Esophagus cancer  INTERVAL HISTORY:   [name] returns as scheduled.  He complete another treat with nivolumab on 06/24/2019.  No rash or diarrhea.  He reports dyspnea and a cough.  He noted hemoptysis after he underwent laryngoscopy on 07/01/2019.  He coughs up dark blood.  The laryngoscopy confirmed left vocal cord paralysis.  He has been referred to ENT at [facility]. He has severe solid dysphagia.  He is tolerating liquids. Objective:  Vital signs in last 24 hours:  Blood pressure 118/64, pulse 69, temperature 98.3 F (36.8 C), temperature source Temporal, resp. rate 17, height '5\' 7"'$  (1.702 m), weight 171 lb 11.2 oz (77.9 kg), SpO2 98 %.    HEENT: Oral cavity without thrush or bleeding Resp: Lungs clear bilaterally Cardio: Regular rate and rhythm GI: No hepatosplenomegaly Vascular: No leg edema   Portacath/PICC-without erythema  Lab Results:  Lab Results  Component Value Date   WBC 4.8 07/07/2019   HGB 8.0 (L) 07/07/2019   HCT 25.8 (L) 07/07/2019   MCV 93.5 07/07/2019   PLT 70 (L) 07/07/2019   NEUTROABS 3.8 07/07/2019    CMP  Lab Results  Component Value Date   NA 138 07/07/2019   K 4.1 07/07/2019   CL 109 07/07/2019   CO2 23 07/07/2019   GLUCOSE 129 (H) 07/07/2019   BUN 19 07/07/2019   CREATININE 1.01 07/07/2019   CALCIUM 8.8 (L) 07/07/2019   PROT 6.8 07/07/2019   ALBUMIN 2.7 (L) 07/07/2019   AST 29 07/07/2019   ALT 15 07/07/2019   ALKPHOS 218 (H) 07/07/2019   BILITOT 0.7 07/07/2019   GFRNONAA >60 07/07/2019   GFRAA >60 07/07/2019    Lab Results  Component Value Date   CEA1 2.08 02/07/2019     Medications: I have reviewed the patient's current medications.   Assessment/Plan: 1. 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 Staging CTs 10/26/2017-asymmetric esophageal thickening, no evidence of metastatic disease ? Endoscopic biopsy 10/20/2017-mid esophagus  partially obstructing mass, biopsy confirmed squamous cell carcinoma ? PET scan 11/02/2017-wall thickening in the mid esophagus SUV 27 MA: No findings specific for metastatic disease ? Radiation 11/09/2017-12/17/2017 ? Cycle 1 weekly Taxol/carboplatin 11/10/2017 ? Cycle 2 weekly Taxol/carboplatin 11/17/2017 (carboplatin held due to thrombocytopenia) ? Cycle 3 weekly Taxol/carboplatin 12/08/2017 (dosesreduced due to neutropenia, thrombocytopenia) ? Cycle 4 weekly Taxol/carboplatin 12/15/2017 ? Endoscopy 03/29/2018-no mass seen ? CT chest 12/29/2018-2 cm area associated with anterior wall the proximal esophagus, new-nonspecific, changes of cirrhosis ? PD-L1 CPS 5% ? Cycle 1 FOLFOX 02/07/2019 ? Cycle 2 FOLFOX 03/07/2019, Neulasta ? Cycle 3 FOLFOX 03/29/2019, Neulasta (oxaliplatin dose reduced to 40 mg/m due to thrombocytopenia following cycle 2) ? Cycle 4 FOLFOX 04/12/2019 ? Cycle 5 FOLFOX 04/26/2019 ? Cycle 6 FOLFOX 05/11/2019 ? CT chest 05/20/2019-enlargement of mass at the upper esophagus, changes of cirrhosis and emphysema ? Cycle 1 nivolumab 05/25/2019 ? Cycle 2 nivolumab 06/09/2019 ? Cycle 3 nivolumab 06/24/2019  ? Cycle 4 nivolumab 07/07/2019 2. Solid dysphagia and odynophagia secondary to #1 3. Coronary artery disease 4. History of prostate cancer 5. COPD 6. Permanent cardiac pacemaker 7. Peripheral vascular disease 8. Chronic thrombocytopenia 9. 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 CT chest 12/28/2017-no evidence of pulmonary emboli. Esophageal thickening consistent with patient's history of esophageal carcinoma. 11. Probable cirrhosis, referred to GI 12. Anemia-progressive compared to when he was here in June, potentially related to GI bleeding or epistaxis. Red cell  transfusions 03/21/2019 and 04/28/2019 13. 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 Status post evaluation by Dr.  Blenda Nicely, not a candidate for vocal fold injection at present. 14. Port-A-Cath placed 02/02/2011 15. Neutropenia secondary to chemotherapy 16. Left vocal cord paralysis secondary to #1    Disposition: [name] appears unchanged.  He will complete another treatment with nivolumab today.  He has been referred to ENT at [facility] for management of the left vocal cord paralysis.  He has stable anemia.  He has mild exertional dyspnea.  He will call for increased dyspnea and we will arrange for a red cell transfusion.  I will consult Dr. Lisbeth Renshaw to discuss the possibility of additional radiation.  [name] will return for an office visit and nivolumab in 2 weeks.  We will plan for a restaging chest CT after the next treatment with nivolumab.  Betsy Coder, MD  07/07/2019  11:12 AM

## 2019-07-07 NOTE — Telephone Encounter (Signed)
Scheduled appt per 4/22 los - Rn aware to print out new schedule after chemo

## 2019-07-07 NOTE — Patient Instructions (Signed)
[facility] Discharge Instructions for Patients Receiving Chemotherapy  Today you received the following Immunotherapy: Opdivo  To help prevent nausea and vomiting after your treatment, we encourage you to take your nausea medication as directed by your MD.   If you develop nausea and vomiting that is not controlled by your nausea medication, call the clinic.   BELOW ARE SYMPTOMS THAT SHOULD BE REPORTED IMMEDIATELY:  *FEVER GREATER THAN 100.5 F  *CHILLS WITH OR WITHOUT FEVER  NAUSEA AND VOMITING THAT IS NOT CONTROLLED WITH YOUR NAUSEA MEDICATION  *UNUSUAL SHORTNESS OF BREATH  *UNUSUAL BRUISING OR BLEEDING  TENDERNESS IN MOUTH AND THROAT WITH OR WITHOUT PRESENCE OF ULCERS  *URINARY PROBLEMS  *BOWEL PROBLEMS  UNUSUAL RASH Items with * indicate a potential emergency and should be followed up as soon as possible.  Feel free to call the clinic should you have any questions or concerns. The clinic phone number is [phone].  Please show the CHEMO ALERT CARD at check-in to the Emergency Department and triage nurse. Coronavirus (COVID-19) Are you at risk?  Are you at risk for the Coronavirus (COVID-19)?  To be considered HIGH RISK for Coronavirus (COVID-19), you have to meet the following criteria:  . Traveled to [place], [place], [place], [place] or [place]; or in the [place] to [place], [place], [place], or [place]; and have fever, cough, and shortness of breath within the last 2 weeks of travel OR . Been in close contact with a person diagnosed with COVID-19 within the last 2 weeks and have fever, cough, and shortness of breath . IF YOU DO NOT MEET THESE CRITERIA, YOU ARE CONSIDERED LOW RISK FOR COVID-19.  What to do if you are HIGH RISK for COVID-19?  . If you are having a medical emergency, call 911. . Seek medical care right away. Before you go to a doctor's office, urgent care or emergency department, call ahead and tell them about your  recent travel, contact with someone diagnosed with COVID-19, and your symptoms. You should receive instructions from your physician's office regarding next steps of care.  . When you arrive at healthcare provider, tell the healthcare staff immediately you have returned from visiting [place], [place], [place], [place] or [place]; or traveled in the [place] to [place], [place], [place], or [place]; in the last two weeks or you have been in close contact with a person diagnosed with COVID-19 in the last 2 weeks.   . Tell the health care staff about your symptoms: fever, cough and shortness of breath. . After you have been seen by a medical provider, you will be either: o Tested for (COVID-19) and discharged home on quarantine except to seek medical care if symptoms worsen, and asked to  - Stay home and avoid contact with others until you get your results (4-5 days)  - Avoid travel on public transportation if possible (such as bus, train, or airplane) or o Sent to the Emergency Department by EMS for evaluation, COVID-19 testing, and possible admission depending on your condition and test results.  What to do if you are LOW RISK for COVID-19?  Reduce your risk of any infection by using the same precautions used for avoiding the common cold or flu:  . Wash your hands often with soap and warm water for at least 20 seconds.  If soap and water are not readily available, use an alcohol-based hand sanitizer with at least 60% alcohol.  . If coughing or   sneezing, cover your mouth and nose by coughing or sneezing into the elbow areas of your shirt or coat, into a tissue or into your sleeve (not your hands). . Avoid shaking hands with others and consider head nods or verbal greetings only. . Avoid touching your eyes, nose, or mouth with unwashed hands.  . Avoid close contact with people who are sick. . Avoid places or events with large numbers of people in one location, like concerts or sporting  events. . Carefully consider travel plans you have or are making. . If you are planning any travel outside or inside the US, visit the CDC's Travelers' Health webpage for the latest health notices. . If you have some symptoms but not all symptoms, continue to monitor at home and seek medical attention if your symptoms worsen. . If you are having a medical emergency, call 911.   ADDITIONAL HEALTHCARE OPTIONS FOR PATIENTS  [facility] / e-Visit: [email or web address]         [facility]: [phone]  [facility]: [phone]                   [facility]: [phone]   

## 2019-07-07 NOTE — Progress Notes (Signed)
Okay to treat with today's labs per Dr Benay Spice

## 2019-07-07 NOTE — Progress Notes (Signed)
Per Dr. Benay Spice: OK to treat with platelets 70,000

## 2019-07-12 ENCOUNTER — Telehealth: Payer: Self-pay

## 2019-07-12 ENCOUNTER — Telehealth: Payer: Self-pay | Admitting: Radiation Oncology

## 2019-07-12 ENCOUNTER — Telehealth: Payer: Self-pay | Admitting: *Deleted

## 2019-07-12 DIAGNOSIS — C159 Malignant neoplasm of esophagus, unspecified: Secondary | ICD-10-CM

## 2019-07-12 NOTE — Telephone Encounter (Signed)
Nutrition Follow-up:  Patient with esophageal cancer.    Patient's daughter in law, Danton Clap called RD back as patient hard to understand on the phone.  Alice reports that patient is coughing up dark blood about every 5 minutes.  Amounts to about 1/2 cup all day.  Patient feels weaker than when he last saw Dr. Benay Spice and short of breath.  Wanting to know if he can get a medicine to help him sleep due to coughing is keeping him up at night.    Reports that he is really just eating grits, mashed potatoes, soups, water, milk and ensure 1-2 per day.  Even these items are hare for him to swallow.    Noted GI not recommending PEG tube  Medications: reviewed  Labs: reviewed  Anthropometrics:   Weight 171 lb 11.2 oz  Decreased from 180s 05/11/19   Estimated Energy Needs  Kcals: 1950-2300 Protein: 98-115 g Fluid: > 1.9 L  NUTRITION DIAGNOSIS: Inadequate oral intake related to cancer and dysphagia as evidenced by 6% weight loss in the last 2 months, poor po intake    INTERVENTION:  Sent message to Dr. Gearldine Shown RN regarding patient symptoms.   Recommend ensure enlive or ensure plus or boost plus to drink 5-6 times per day if able to better meet nutritional needs. Discussed pureeing foods with blender, magic bullet to make them easier to swallow.       MONITORING, EVALUATION, GOAL: intake, weight trends, poc   NEXT VISIT: May 11 th phone f/u [id number] Alice daughter in Futures trader B. Zenia Resides, [place], [place] Registered Dietitian [phone] (pager)

## 2019-07-12 NOTE — Telephone Encounter (Signed)
Nutrition  Received request from nursing to touch base with patient.    Called and left message on home answering machine with call back number.   Demetry Bendickson B. Zenia Resides, [place], [place] Registered Dietitian [phone] (pager)

## 2019-07-12 NOTE — Telephone Encounter (Signed)
Called daughter-in-law back based on message received from dietician regarding hemoptysis of 1/2 cup today. Persistent coughing and asking for cough medicine. Spoke with patient and he said he has coughed up 1/2 cup over last week, however he does feel very weak. He agrees to have CBC checked tomorrow after his RT appointment and is open to transfusion if necessary. Has not been taking his Hycet solution. RN suggested he take this prn and it may help his cough as well. Dr. Benay Spice suggests he take Robitussin on Delsym OTC.

## 2019-07-13 ENCOUNTER — Encounter: Payer: Self-pay | Admitting: Radiation Oncology

## 2019-07-13 ENCOUNTER — Ambulatory Visit
Admission: RE | Admit: 2019-07-13 | Discharge: 2019-07-13 | Disposition: A | Discharge status: Home / Self Care | Payer: Medicare Other | Source: Ambulatory Visit | Attending: Radiation Oncology | Admitting: Radiation Oncology

## 2019-07-13 ENCOUNTER — Other Ambulatory Visit: Payer: Medicare Other

## 2019-07-13 ENCOUNTER — Other Ambulatory Visit: Payer: Self-pay

## 2019-07-13 ENCOUNTER — Other Ambulatory Visit: Payer: Self-pay | Admitting: Radiation Oncology

## 2019-07-13 DIAGNOSIS — C159 Malignant neoplasm of esophagus, unspecified: Secondary | ICD-10-CM

## 2019-07-13 NOTE — Progress Notes (Signed)
Radiation Oncology         [phone] ________________________________  Outpatient Reconsultation - Conducted via telephone due to current COVID-19 concerns for limiting patient exposure  I spoke with the patient to conduct this consult visit via telephone to spare the patient unnecessary potential exposure in the healthcare setting during the current COVID-19 pandemic. The patient was notified in advance and was offered a [place] meeting to allow for face to face communication but unfortunately reported that they did not have the appropriate resources/technology to support such a visit and instead preferred to proceed with a telephone visit  Name: [name].        MRN: [id number]  Date of Service: 07/13/2019 DOB: 08/01/1934  [id number], Curtis Jews, MD  Ladell Pier, MD     REFERRING PHYSICIAN: Ladell Pier, MD   DIAGNOSIS: The encounter diagnosis was Squamous cell esophageal cancer [facility]).   HISTORY OF PRESENT ILLNESS: [name]. is a 85 y.o. male seen at the request of Dr. 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 The patient was originally diagnosed with his cancer in the summer of 2019. He completed chemoRT on 12/17/17. He did not have esophagectomy but did have an endoscopy on 03/29/2018 that did not see any evidence of mass. A restaging CT scan on 12/29/2018 showed recurrent disease along the proximal esophagus and he proceeded with FOLFOX chemotherapy between 02/07/2019 and 05/11/19. CT on 05/20/19 revealed progressive enlargement of the mass in the upper esophagus now measuring 3.3 x 2.6 x 4 cm. He has also developed cirrhotic appearing changes of the liver. He was started on nivolumab on 05/25/19 and has received 4 cycles thusfar. He's contacted to discuss options of re-irradiation to the esophagus given the progression.   PREVIOUS RADIATION THERAPY: Yes   11/09/2017 - 12/17/2017: Theesophagusreceived50 Gy in 25 fractions  initially, also using a simultaneous boost technique to 45 Gy. A boost wasthen given to 6 Gy/5.4 Gy using a SIB technique to yield 56 Gy to the high dose target and 50.4 Gy to the lower dose target.   PAST MEDICAL HISTORY:  Past Medical History:  Diagnosis Date   AAA (abdominal aortic aneurysm) ([place])    a. Last duplex - 3.8cm 01/2013 - due 01/2014.   Acne rosacea    Alcoholic hepatitis with ascites 08/28/2017   Anginal pain ([place]) 1996   Aortic stenosis, mild    Last echo 05/04/12 +LVH   Arthritis    "shoulders" (09/01/2017)   Back pain    hx epidural injections   Baker's cyst    Lt.   CAD (coronary artery disease)    a. s/p MI/PTCA - Cx 1994. b. s/p CABG x5 in 1997. c. Abnl nuc 2003- occ SVG-RCA, patent seq SVG-OM1-dCxOM, patentl LIMA-LAD, patent, SVG-diag. d. Normal nuc 09/2012.   Carotid artery disease ([place])    a. Duplex 01/2013: mildly abnormal. Mild plaque without significant diameter reduction. Repeat recommended 11/15.   COPD (chronic obstructive pulmonary disease) (HCC)    Dizziness and giddiness 08/04/2013   GERD (gastroesophageal reflux disease)    H/O: GI bleed    mild, neg. colonoscopy   Heart murmur    HOH (hard of hearing)    HTN (hypertension)    Hyperlipidemia    Hypertensive heart disease    Myocardial infarction ([place]) 1994   OSA on CPAP    OSA treated with BiPAP 11/20/2015   Severe with AHi 38/hr   PAF (paroxysmal atrial fibrillation) (  [place])    a. Remote per records.    Pneumonia    history   Presence of permanent cardiac pacemaker    DOI 1999 with change out 2006; St Jude   Prostate cancer [facility]) ~ 2010   a. s/p radical retropubic prostatectomy with bilateral pelvic lymph node dissection   PVD (peripheral vascular disease) ([place])    a. Rt ext iliac stenosis, last PV cath 2003, moderate stenosis last Lower Ext Dopplers 12/16/11 - Rt. ABI 0.96  Lt ABI 1.0.   Sick sinus syndrome ([place])    a. placed 1999, gen change 2011 - St. Jude.    Sinus node dysfunction (HCC) 01/20/2013   Syncope 11/28/97   Thrombocytopenia (HCC)    chronic, mild   Valvular heart disease    a. Echo 04/2012: mild-mod AS, mild AI, mild MR.       PAST SURGICAL HISTORY: Past Surgical History:  Procedure Laterality Date   APPENDECTOMY     BIOPSY  10/20/2017   Procedure: BIOPSY;  Surgeon: Doran Stabler, MD;  Location: [facility];  Service: Gastroenterology;;   CARDIAC CATHETERIZATION  2003   VG to RCA occluded, other grafts patent   CARDIAC CATHETERIZATION N/A 05/04/2015   Procedure: Right/Left Heart Cath and Coronary/Graft Angiography;  Surgeon: Sherren Mocha, MD;  Location: [place] [facility];  Service: Cardiovascular;  Laterality: N/A;   CARDIAC CATHETERIZATION  09/01/2017   CARDIAC VALVE REPLACEMENT     CATARACT EXTRACTION W/ INTRAOCULAR LENS  IMPLANT, BILATERAL Bilateral    COLONOSCOPY     CORONARY ANGIOPLASTY  03/29/92   PTCA to LCX   CORONARY ARTERY BYPASS GRAFT  1997   LIMA-LAD; VG-diag; seq VG-1st OM & distal LCX; VG-RCA   CORONARY ATHERECTOMY N/A 09/01/2017   Procedure: CORONARY ATHERECTOMY;  Surgeon: Jettie Booze, MD;  Location: [place] [facility];  Service: Cardiovascular;  Laterality: N/A;   CORONARY ATHERECTOMY  10/01/2017   CORONARY ATHERECTOMY N/A 10/01/2017   Procedure: CORONARY ATHERECTOMY;  Surgeon: Leonie Man, MD;  Location: [place] [facility];  Service: Cardiovascular;  Laterality: N/A;   ESOPHAGOGASTRODUODENOSCOPY (EGD) WITH PROPOFOL N/A 10/20/2017   Procedure: ESOPHAGOGASTRODUODENOSCOPY (EGD) WITH PROPOFOL;  Surgeon: Doran Stabler, MD;  [facility]: [facility];  Service: Gastroenterology;  Laterality: N/A;   ESOPHAGOGASTRODUODENOSCOPY (EGD) WITH PROPOFOL N/A 03/29/2018   Procedure: ESOPHAGOGASTRODUODENOSCOPY (EGD) WITH PROPOFOL;  Surgeon: Doran Stabler, MD;  [facility]: [facility];  Service: Gastroenterology;  Laterality: N/A;   INSERT / REPLACE / REMOVE PACEMAKER  11/28/1997    pacesetter--ERI 2011   IR IMAGING GUIDED PORT INSERTION  02/02/2019   LEFT HEART CATH N/A 09/01/2017   Procedure: Left Heart Cath;  Surgeon: Jettie Booze, MD;  Location: [place] CV [facility];  Service: Cardiovascular;  Laterality: N/A;   LEFT HEART CATH AND CORS/GRAFTS ANGIOGRAPHY N/A 08/28/2017   Procedure: LEFT HEART CATH AND CORS/GRAFTS ANGIOGRAPHY;  Surgeon: Leonie Man, MD;  Location: [place] [facility];  Service: Cardiovascular;  Laterality: N/A;   PACEMAKER GENERATOR CHANGE  08/15/2009   St. Jude accent   SUPRAPUBIC PROSTATECTOMY     TEE WITHOUT CARDIOVERSION N/A 06/12/2015   Procedure: TRANSESOPHAGEAL ECHOCARDIOGRAM (TEE);  Surgeon: Sherren Mocha, MD;  Location: [place];  Service: Open Heart Surgery;  Laterality: N/A;   TONSILLECTOMY     TRANSCATHETER AORTIC VALVE REPLACEMENT, TRANSFEMORAL N/A 06/12/2015   Procedure: TRANSCATHETER AORTIC VALVE REPLACEMENT, TRANSFEMORAL, possible transpical;  Surgeon: Sherren Mocha, MD;  Location: [place];  Service: Open Heart  Surgery;  Laterality: N/A;     FAMILY HISTORY:  Family History  Problem Relation Age of Onset   Colon cancer Father    Cancer Brother    Heart Problems Brother    Heart Problems Sister    CAD Other    Stomach cancer Neg Hx    Pancreatic cancer Neg Hx    Esophageal cancer Neg Hx      SOCIAL HISTORY:  reports that he quit smoking about 24 years ago. His smoking use included cigarettes. He has a 96.00 pack-year smoking history. He has never used smokeless tobacco. He reports that he does not drink alcohol or use drugs. The patient is a retired Chief Strategy Officer. His daughter in law Danton Clap helps care for him and he has adult daughters also.   ALLERGIES: Patient has no known allergies.   MEDICATIONS:  Current Outpatient Medications  Medication Sig Dispense Refill   acetaminophen (TYLENOL) 325 MG tablet Take 650 mg by mouth 2 (two) times daily.     atorvastatin (LIPITOR) 40 MG tablet Take 1 tablet (40 mg  total) by mouth daily at 6 PM. 30 tablet 0   HYDROcodone-acetaminophen (HYCET) 7.5-325 mg/15 ml solution Take 5-10 mLs by mouth every 6 (six) hours as needed for moderate pain. 120 mL 0   lidocaine-prilocaine (EMLA) cream Apply to port site one hour prior to use 30 g 2   losartan (COZAAR) 25 MG tablet TAKE 1 TABLET BY MOUTH DAILY (Patient taking differently: Take 25 mg by mouth at bedtime. ) 90 tablet 3   metoprolol tartrate (LOPRESSOR) 25 MG tablet TAKE 1 TABLET BY MOUTH TWICE DAILY 180 tablet 0   nitroGLYCERIN (NITROSTAT) 0.4 MG SL tablet PLACE 1 TABLET UNDER THE TONGUE EVERY 5 MINUTES AS NEEDED FOR CHEST PAIN UP TO 3 DOSES, IF SYMPTOMS PERSIST CALL 911 25 tablet 2   NON FORMULARY Apply 1 application topically See admin instructions. Tridema pain cream: Rub into both shoulders 2 times a day     pantoprazole (PROTONIX) 40 MG tablet TAKE 1 TABLET BY MOUTH EVERY DAY (Patient taking differently: Take 40 mg by mouth daily. ) 90 tablet 1   prochlorperazine (COMPAZINE) 5 MG tablet Take 1 tablet (5 mg total) by mouth every 6 (six) hours as needed for nausea or vomiting. 30 tablet 2   senna (SENOKOT) 8.6 MG tablet Take 1 tablet by mouth as needed for constipation.     No current facility-administered medications for this encounter.     REVIEW OF SYSTEMS: On review of systems, the patient reports that he is struggling with weakness, shortness of breath, fevers in the low 100 degree F range, and progressive bloody nose and from the esophagus. He is struggling with fatigue, and poor oral intake. No other complaints are noted.    PHYSICAL EXAM:  Unable to assess given encounter type.   ECOG = 1  0 - Asymptomatic (Fully active, able to carry on all predisease activities without restriction)  1 - Symptomatic but completely ambulatory (Restricted in physically strenuous activity but ambulatory and able to carry out work of a light or sedentary nature. For example, light housework, office  work)  2 - Symptomatic, <50% in bed during the day (Ambulatory and capable of all self care but unable to carry out any work activities. Up and about more than 50% of waking hours)  3 - Symptomatic, >50% in bed, but not bedbound (Capable of only limited self-care, confined to bed or chair 50% or more of waking hours)  4 - Bedbound (Completely disabled. Cannot carry on any self-care. Totally confined to bed or chair)  5 - Death   [name] MM, Creech RH, Tormey DC, et al. [phone]). "Toxicity and response criteria of the [facility] Group". [place] Oncol. 5 (6): 649-55    LABORATORY DATA:  Lab Results  Component Value Date   WBC 4.8 07/07/2019   HGB 8.0 (L) 07/07/2019   HCT 25.8 (L) 07/07/2019   MCV 93.5 07/07/2019   PLT 70 (L) 07/07/2019   Lab Results  Component Value Date   NA 138 07/07/2019   K 4.1 07/07/2019   CL 109 07/07/2019   CO2 23 07/07/2019   Lab Results  Component Value Date   ALT 15 07/07/2019   AST 29 07/07/2019   ALKPHOS 218 (H) 07/07/2019   BILITOT 0.7 07/07/2019      RADIOGRAPHY: CUP PACEART REMOTE DEVICE CHECK  Result Date: 06/16/2019 Scheduled remote reviewed. Normal device function.  Next remote 91 days. Kathy Breach, RN, CCDS, [employer]      IMPRESSION/PLAN: 1. Recurrent squamous cell carcinoma of the middle third esophagus, with progression in the proximal esophagus. Dr. Lisbeth Renshaw discusses the patient's course since our last conversation. His tumor is higher up than it had been initially, so the recurrence is technically in a location to consider a definitive course over about 5 weeks of therapy. The alternative option for radiotherapy would be a shortened course of a palliative regimen over 2-3 weeks. We discussed the risks, benefits, short, and long term effects of radiotherapy, and the patient is interested in proceeding with a 5 week course. Dr. Lisbeth Renshaw discusses the delivery and logistics of radiotherapy and we will proceed  with simulation on 07/21/19. Consent will be signed at that time. 2. Weakness, pancytopenia, shortness of breath and fever. I have reached out to Dr. Benay Sims given the patient's ongoing immunotherapy. We will follow up with his recommendations expectantly.  3. Goals of care. It is clear from speaking with the patient's daughter in law, that there are some struggles in knowing [name] goals for his care. We discussed options to meet with palliative care and they elect for this in the home setting. Referral to [facility] Palliative [facility] Department was made through [facility]. We will follow along expectantly with his course.  Given current concerns for patient exposure during the COVID-19 pandemic, this encounter was conducted via telephone.  The patient has provided two factor identification and has given verbal consent for this type of encounter and has been advised to only accept a meeting of this type in a secure network environment. The time spent during this encounter was 60 minutes including preparation, discussion, and coordination of the patient's care. The attendants for this meeting include  Dr. Lisbeth Renshaw, Hayden Pedro  and [name]. and his daughter in law Doyle. During the encounter,  Dr. Lisbeth Renshaw, and Hayden Pedro were located at [facility] Rad[facility] On[facility] Department.  [name]. was located at home with his daughter in Sports coach.   The above documentation reflects my direct findings during this shared patient visit. Please see the separate note by Dr. Lisbeth Renshaw on this date for the remainder of the patient's plan of care.    Carola Rhine, PAC

## 2019-07-15 NOTE — Progress Notes (Signed)
Pharmacist Chemotherapy Monitoring - Follow Up Assessment    I verify that I have reviewed each item in the below checklist:  . Regimen for the patient is scheduled for the appropriate day and plan matches scheduled date. [name] Appropriate non-routine labs are ordered dependent on drug ordered. . If applicable, additional medications reviewed and ordered per protocol based on lifetime cumulative doses and/or treatment regimen.   Plan for follow-up and/or issues identified: No . I-vent associated with next due treatment: No . MD and/or nursing notified: No   Kennith Center, Pharm.D., CPP 07/15/2019@2 :36 PM

## 2019-07-17 ENCOUNTER — Other Ambulatory Visit: Payer: Self-pay | Admitting: Oncology

## 2019-07-19 ENCOUNTER — Emergency Department (HOSPITAL_COMMUNITY): Payer: Medicare Other

## 2019-07-19 ENCOUNTER — Inpatient Hospital Stay (HOSPITAL_COMMUNITY)
Admission: EM | Admit: 2019-07-19 | Discharge: 2019-07-27 | DRG: 374 | Disposition: A | Discharge status: Hospice | Payer: Medicare Other | Attending: Internal Medicine | Admitting: Internal Medicine

## 2019-07-19 ENCOUNTER — Encounter (HOSPITAL_COMMUNITY): Payer: Self-pay | Admitting: Osteopathic Medicine

## 2019-07-19 ENCOUNTER — Telehealth: Payer: Self-pay | Admitting: Radiation Oncology

## 2019-07-19 ENCOUNTER — Other Ambulatory Visit: Payer: Self-pay

## 2019-07-19 DIAGNOSIS — Z8546 Personal history of malignant neoplasm of prostate: Secondary | ICD-10-CM

## 2019-07-19 DIAGNOSIS — J690 Pneumonitis due to inhalation of food and vomit: Secondary | ICD-10-CM | POA: Diagnosis not present

## 2019-07-19 DIAGNOSIS — J9601 Acute respiratory failure with hypoxia: Secondary | ICD-10-CM | POA: Diagnosis not present

## 2019-07-19 DIAGNOSIS — E440 Moderate protein-calorie malnutrition: Secondary | ICD-10-CM | POA: Insufficient documentation

## 2019-07-19 DIAGNOSIS — C159 Malignant neoplasm of esophagus, unspecified: Secondary | ICD-10-CM

## 2019-07-19 DIAGNOSIS — K228 Other specified diseases of esophagus: Secondary | ICD-10-CM | POA: Diagnosis not present

## 2019-07-19 DIAGNOSIS — K5900 Constipation, unspecified: Secondary | ICD-10-CM | POA: Diagnosis present

## 2019-07-19 DIAGNOSIS — R042 Hemoptysis: Secondary | ICD-10-CM | POA: Diagnosis not present

## 2019-07-19 DIAGNOSIS — I495 Sick sinus syndrome: Secondary | ICD-10-CM | POA: Diagnosis present

## 2019-07-19 DIAGNOSIS — Z800 Family history of malignant neoplasm of digestive organs: Secondary | ICD-10-CM

## 2019-07-19 DIAGNOSIS — I714 Abdominal aortic aneurysm, without rupture, unspecified: Secondary | ICD-10-CM | POA: Diagnosis present

## 2019-07-19 DIAGNOSIS — R1310 Dysphagia, unspecified: Secondary | ICD-10-CM

## 2019-07-19 DIAGNOSIS — Z45018 Encounter for adjustment and management of other part of cardiac pacemaker: Secondary | ICD-10-CM

## 2019-07-19 DIAGNOSIS — I2510 Atherosclerotic heart disease of native coronary artery without angina pectoris: Secondary | ICD-10-CM | POA: Diagnosis not present

## 2019-07-19 DIAGNOSIS — I252 Old myocardial infarction: Secondary | ICD-10-CM

## 2019-07-19 DIAGNOSIS — Z952 Presence of prosthetic heart valve: Secondary | ICD-10-CM

## 2019-07-19 DIAGNOSIS — Z8249 Family history of ischemic heart disease and other diseases of the circulatory system: Secondary | ICD-10-CM

## 2019-07-19 DIAGNOSIS — Z515 Encounter for palliative care: Secondary | ICD-10-CM

## 2019-07-19 DIAGNOSIS — Z951 Presence of aortocoronary bypass graft: Secondary | ICD-10-CM

## 2019-07-19 DIAGNOSIS — T451X5A Adverse effect of antineoplastic and immunosuppressive drugs, initial encounter: Secondary | ICD-10-CM | POA: Diagnosis present

## 2019-07-19 DIAGNOSIS — Z20822 Contact with and (suspected) exposure to covid-19: Secondary | ICD-10-CM | POA: Diagnosis present

## 2019-07-19 DIAGNOSIS — D61810 Antineoplastic chemotherapy induced pancytopenia: Secondary | ICD-10-CM | POA: Diagnosis present

## 2019-07-19 DIAGNOSIS — K920 Hematemesis: Secondary | ICD-10-CM | POA: Diagnosis present

## 2019-07-19 DIAGNOSIS — C7839 Secondary malignant neoplasm of other respiratory organs: Secondary | ICD-10-CM | POA: Diagnosis present

## 2019-07-19 DIAGNOSIS — Z87891 Personal history of nicotine dependence: Secondary | ICD-10-CM

## 2019-07-19 DIAGNOSIS — J439 Emphysema, unspecified: Secondary | ICD-10-CM | POA: Diagnosis present

## 2019-07-19 DIAGNOSIS — C154 Malignant neoplasm of middle third of esophagus: Secondary | ICD-10-CM | POA: Diagnosis not present

## 2019-07-19 DIAGNOSIS — R5381 Other malaise: Secondary | ICD-10-CM | POA: Diagnosis present

## 2019-07-19 DIAGNOSIS — Z6826 Body mass index (BMI) 26.0-26.9, adult: Secondary | ICD-10-CM

## 2019-07-19 DIAGNOSIS — G4733 Obstructive sleep apnea (adult) (pediatric): Secondary | ICD-10-CM | POA: Diagnosis present

## 2019-07-19 DIAGNOSIS — K2289 Other specified disease of esophagus: Secondary | ICD-10-CM

## 2019-07-19 DIAGNOSIS — R509 Fever, unspecified: Secondary | ICD-10-CM

## 2019-07-19 DIAGNOSIS — K219 Gastro-esophageal reflux disease without esophagitis: Secondary | ICD-10-CM | POA: Diagnosis present

## 2019-07-19 DIAGNOSIS — I480 Paroxysmal atrial fibrillation: Secondary | ICD-10-CM | POA: Diagnosis present

## 2019-07-19 DIAGNOSIS — R627 Adult failure to thrive: Secondary | ICD-10-CM

## 2019-07-19 DIAGNOSIS — I10 Essential (primary) hypertension: Secondary | ICD-10-CM | POA: Diagnosis present

## 2019-07-19 DIAGNOSIS — Z66 Do not resuscitate: Secondary | ICD-10-CM

## 2019-07-19 DIAGNOSIS — Z950 Presence of cardiac pacemaker: Secondary | ICD-10-CM

## 2019-07-19 DIAGNOSIS — K1230 Oral mucositis (ulcerative), unspecified: Secondary | ICD-10-CM | POA: Diagnosis present

## 2019-07-19 DIAGNOSIS — I739 Peripheral vascular disease, unspecified: Secondary | ICD-10-CM | POA: Diagnosis present

## 2019-07-19 DIAGNOSIS — Z923 Personal history of irradiation: Secondary | ICD-10-CM

## 2019-07-19 DIAGNOSIS — H9190 Unspecified hearing loss, unspecified ear: Secondary | ICD-10-CM | POA: Diagnosis present

## 2019-07-19 DIAGNOSIS — D630 Anemia in neoplastic disease: Secondary | ICD-10-CM | POA: Diagnosis present

## 2019-07-19 DIAGNOSIS — I119 Hypertensive heart disease without heart failure: Secondary | ICD-10-CM | POA: Diagnosis present

## 2019-07-19 LAB — COMPREHENSIVE METABOLIC PANEL
ALT: 15 U/L (ref 0–44)
AST: 27 U/L (ref 15–41)
Albumin: 2.9 g/dL — ABNORMAL LOW (ref 3.5–5.0)
Alkaline Phosphatase: 192 U/L — ABNORMAL HIGH (ref 38–126)
Anion gap: 9 (ref 5–15)
BUN: 18 mg/dL (ref 8–23)
CO2: 22 mmol/L (ref 22–32)
Calcium: 8.7 mg/dL — ABNORMAL LOW (ref 8.9–10.3)
Chloride: 107 mmol/L (ref 98–111)
Creatinine, Ser: 0.96 mg/dL (ref 0.61–1.24)
GFR calc Af Amer: >60 mL/min (ref >60)
GFR calc non Af Amer: >60 mL/min (ref >60)
Glucose, Bld: 105 mg/dL — ABNORMAL HIGH (ref 70–99)
Potassium: 3.7 mmol/L (ref 3.5–5.1)
Sodium: 138 mmol/L (ref 135–145)
Total Bilirubin: 1.1 mg/dL (ref 0.3–1.2)
Total Protein: 7.1 g/dL (ref 6.5–8.1)

## 2019-07-19 LAB — PROTIME-INR
INR: 1.2 (ref 0.8–1.2)
Prothrombin Time: 14.7 seconds (ref 11.4–15.2)

## 2019-07-19 LAB — CBC
HCT: 27 % — ABNORMAL LOW (ref 39.0–52.0)
HCT: 25.5 % — ABNORMAL LOW (ref 39.0–52.0)
Hemoglobin: 8.4 g/dL — ABNORMAL LOW (ref 13.0–17.0)
Hemoglobin: 8 g/dL — ABNORMAL LOW (ref 13.0–17.0)
MCH: 29.1 pg (ref 26.0–34.0)
MCH: 29.3 pg (ref 26.0–34.0)
MCHC: 31.1 g/dL (ref 30.0–36.0)
MCHC: 31.4 g/dL (ref 30.0–36.0)
MCV: 93.4 fL (ref 80.0–100.0)
MCV: 93.4 fL (ref 80.0–100.0)
Platelets: 117 10*3/uL — ABNORMAL LOW (ref 150–400)
Platelets: 100 10*3/uL — ABNORMAL LOW (ref 150–400)
RBC: 2.89 MIL/uL — ABNORMAL LOW (ref 4.22–5.81)
RBC: 2.73 MIL/uL — ABNORMAL LOW (ref 4.22–5.81)
RDW: 15.8 % — ABNORMAL HIGH (ref 11.5–15.5)
RDW: 15.9 % — ABNORMAL HIGH (ref 11.5–15.5)
WBC: 3.6 10*3/uL — ABNORMAL LOW (ref 4.0–10.5)
WBC: 2.9 10*3/uL — ABNORMAL LOW (ref 4.0–10.5)
nRBC: 0 % (ref 0.0–0.2)
nRBC: 0 % (ref 0.0–0.2)

## 2019-07-19 LAB — PREPARE RBC (CROSSMATCH)

## 2019-07-19 LAB — RESPIRATORY PANEL BY RT PCR (FLU A&B, COVID)
Influenza A by PCR: NEGATIVE
Influenza B by PCR: NEGATIVE
SARS Coronavirus 2 by RT PCR: NEGATIVE

## 2019-07-19 LAB — ABO/RH: ABO/RH(D): A POS

## 2019-07-19 MED ORDER — IOHEXOL 300 MG/ML  SOLN
75.0000 mL | Freq: Once | INTRAMUSCULAR | Status: AC | PRN
  Administered 2019-07-19: 13:00:00 75 mL via INTRAVENOUS

## 2019-07-19 MED ORDER — ATORVASTATIN CALCIUM 40 MG PO TABS
40.0000 mg | ORAL_TABLET | Freq: Every day | ORAL | Status: DC
  Administered 2019-07-19 – 2019-07-22 (×4): 40 mg via ORAL
  Filled 2019-07-19 (×3): qty 1

## 2019-07-19 MED ORDER — SENNA 8.6 MG PO TABS
1.0000 | ORAL_TABLET | ORAL | Status: DC | PRN
  Administered 2019-07-20 – 2019-07-26 (×2): 8.6 mg via ORAL
  Filled 2019-07-19 (×3): qty 1

## 2019-07-19 MED ORDER — SODIUM CHLORIDE 0.9 % IV BOLUS (SEPSIS)
500.0000 mL | Freq: Once | INTRAVENOUS | Status: AC
  Administered 2019-07-19: 12:00:00 500 mL via INTRAVENOUS

## 2019-07-19 MED ORDER — SODIUM CHLORIDE 0.9 % IV SOLN: 10.0000 mL/h | Freq: Once | INTRAVENOUS | Status: DC

## 2019-07-19 MED ORDER — METOPROLOL TARTRATE 25 MG PO TABS
25.0000 mg | ORAL_TABLET | Freq: Two times a day (BID) | ORAL | Status: DC
  Administered 2019-07-19 – 2019-07-21 (×5): 25 mg via ORAL
  Filled 2019-07-19 (×6): qty 1

## 2019-07-19 MED ORDER — HYDROCODONE-ACETAMINOPHEN 7.5-325 MG/15ML PO SOLN
5.0000 mL | Freq: Four times a day (QID) | ORAL | Status: DC | PRN
  Administered 2019-07-19 – 2019-07-22 (×7): 10 mL via ORAL
  Filled 2019-07-19 (×8): qty 15

## 2019-07-19 MED ORDER — PROCHLORPERAZINE MALEATE 10 MG PO TABS: 5.0000 mg | ORAL_TABLET | Freq: Four times a day (QID) | ORAL | Status: DC | PRN

## 2019-07-19 MED ORDER — SODIUM CHLORIDE (PF) 0.9 % IJ SOLN
INTRAMUSCULAR | Status: AC
  Filled 2019-07-19: qty 50

## 2019-07-19 MED ORDER — ACETAMINOPHEN 325 MG PO TABS
650.0000 mg | ORAL_TABLET | Freq: Two times a day (BID) | ORAL | Status: DC
  Administered 2019-07-19 – 2019-07-21 (×4): 650 mg via ORAL
  Filled 2019-07-19 (×8): qty 2

## 2019-07-19 MED ORDER — RESOURCE THICKENUP CLEAR PO POWD
ORAL | Status: DC | PRN
  Filled 2019-07-19: qty 125

## 2019-07-19 MED ORDER — PANTOPRAZOLE SODIUM 40 MG IV SOLR
40.0000 mg | Freq: Two times a day (BID) | INTRAVENOUS | Status: DC
  Administered 2019-07-19 – 2019-07-24 (×10): 40 mg via INTRAVENOUS
  Filled 2019-07-19 (×10): qty 40

## 2019-07-19 MED ORDER — LOSARTAN POTASSIUM 25 MG PO TABS
25.0000 mg | ORAL_TABLET | Freq: Every day | ORAL | Status: DC
  Administered 2019-07-19 – 2019-07-23 (×4): 25 mg via ORAL
  Filled 2019-07-19 (×5): qty 1

## 2019-07-19 NOTE — Progress Notes (Signed)
Pt has declined use of CPAP QHS, Pt states that he is unable to utilize at this time.  RT to monitor and assess as needed.

## 2019-07-19 NOTE — ED Triage Notes (Signed)
Per [facility] Patient is coming from home with hx of throat cancer that he has been getting treatment for x4 months. Patient reports hemoptysis x2 weeks that is getting progressively worse.   VSS

## 2019-07-19 NOTE — ED Provider Notes (Signed)
[facility] DEPT Provider Note   CSN: [id number] Arrival date & time: 07/19/19  1020     History Chief Complaint  Patient presents with  . Hemoptysis    [name]. is a 85 y.o. male.  Patient complains of coughing up some blood.  Patient has a cancer in his esophagus.  He had the patient of Dr. Geanie Cooley and has been seen by ENT.  He states that he has been coughing up more blood lately.  The history is provided by the patient. No language interpreter was used.  Weakness Severity:  Moderate Onset quality:  Sudden Timing:  Constant Progression:  Worsening Chronicity:  Recurrent Context: not alcohol use   Relieved by:  Nothing Worsened by:  Nothing Ineffective treatments:  None tried Associated symptoms: no abdominal pain, no chest pain, no cough, no diarrhea, no frequency, no headaches and no seizures        Past Medical History:  Diagnosis Date  . AAA (abdominal aortic aneurysm) ([place])    a. Last duplex - 3.8cm 01/2013 - due 01/2014.  [name] Acne rosacea   . Alcoholic hepatitis with ascites 08/28/2017  . Anginal pain ([place]) 1996  . Aortic stenosis, mild    Last echo 05/04/12 +LVH  . Arthritis    "shoulders" (09/01/2017)  . Back pain    hx epidural injections  . Baker's cyst    Lt.  [name] CAD (coronary artery disease)    a. s/p MI/PTCA - Cx 1994. b. s/p CABG x5 in 1997. c. Abnl nuc 2003- occ SVG-RCA, patent seq SVG-OM1-dCxOM, patentl LIMA-LAD, patent, SVG-diag. d. Normal nuc 09/2012.  . Carotid artery disease ([place])    a. Duplex 01/2013: mildly abnormal. Mild plaque without significant diameter reduction. Repeat recommended 11/15.  [name] COPD (chronic obstructive pulmonary disease) ([place])   . Dizziness and giddiness 08/04/2013  . GERD (gastroesophageal reflux disease)   . H/O: GI bleed    mild, neg. colonoscopy  . Heart murmur   . HOH (hard of hearing)   . HTN (hypertension)   . Hyperlipidemia   . Hypertensive heart disease   . Myocardial  infarction ([place]) 1994  . OSA on CPAP   . OSA treated with BiPAP 11/20/2015   Severe with [facility] 38/hr  . PAF (paroxysmal atrial fibrillation) ([place])    a. Remote per records.   . Pneumonia    history  . Presence of permanent cardiac pacemaker    DOI 1999 with change out 2006; St Jude  . Prostate cancer ([place]) ~ 2010   a. s/p radical retropubic prostatectomy with bilateral pelvic lymph node dissection  . PVD (peripheral vascular disease) ([place])    a. Rt ext iliac stenosis, last PV cath 2003, moderate stenosis last Lower Ext Dopplers 12/16/11 - Rt. ABI 0.96  Lt ABI 1.0.  . Sick sinus syndrome ([place])    a. placed 1999, gen change 2011 - St. Jude.  . Sinus node dysfunction ([place]) 01/20/2013  . Syncope 11/28/97  . Thrombocytopenia (H[facility])    chronic, mild  . Valvular heart disease    a. Echo 04/2012: mild-mod AS, mild AI, mild MR.    Patient Active Problem List   Diagnosis Date Noted  . VT (ventricular tachycardia) ([place]) 05/22/2019  . LBBB (left bundle branch block) 05/22/2019  . Port-A-Cath in place 02/21/2019  . Goals of care, counseling/discussion 01/20/2019  . Unstable angina ([place])   . Presbycusis of both ears 04/08/2018  . 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  Anticoagulated by anticoagulation treatment 03/19/2018  . Epistaxis 03/19/2018  . Pacemaker 02/19/2018  . Coronary artery disease involving coronary bypass graft of native heart without angina pectoris 02/19/2018  . Anemia 01/19/2018  . Hypercholesterolemia 12/28/2017  . Encounter for antineoplastic chemotherapy 12/08/2017  . Malignant tumor of middle third of esophagus ([place]) 10/28/2017  . Melena   . Heme positive stool   . Acute post-hemorrhagic anemia   . Esophageal mass   . Angina, class III ([place]) 10/01/2017  . Chest pain with moderate risk for cardiac etiology 09/01/2017  . Chest pain, rule out acute myocardial infarction 08/26/2017  . Essential hypertension 05/14/2016  . OSA (obstructive sleep apnea) 11/20/2015  .  Obesity (BMI 30-39.9) 11/20/2015  . S/P TAVR (transcatheter aortic valve replacement) 06/12/2015  . Severe aortic stenosis 06/05/2015  . AAA (abdominal aortic aneurysm) without rupture ([place]) 04/27/2015  . PAD (peripheral artery disease) ([place]) 04/27/2015  . Dizziness and giddiness 08/04/2013  . Dizziness 07/19/2013  . SSS (sick sinus syndrome) ([place]) 01/20/2013  . Hypertensive cardiovascular disease 01/20/2013  . Paroxysmal atrial fibrillation (H[facility])   . CAD, multiple vessel   . Pacemaker -[employer]   . Aortic stenosis 05/07/2012    Class: Diagnosis of  . Syncope 11/28/1997    Past Surgical History:  Procedure Laterality Date  . APPENDECTOMY    . BIOPSY  10/20/2017   Procedure: BIOPSY;  Surgeon: Doran Stabler, MD;  Location: [facility];  Service: Gastroenterology;;  . CARDIAC CATHETERIZATION  2003   VG to RCA occluded, other grafts patent  . CARDIAC CATHETERIZATION N/A 05/04/2015   Procedure: Right/Left Heart Cath and Coronary/Graft Angiography;  Surgeon: Sherren Mocha, MD;  Location: [facility];  Service: Cardiovascular;  Laterality: N/A;  . CARDIAC CATHETERIZATION  09/01/2017  . CARDIAC VALVE REPLACEMENT    . CATARACT EXTRACTION W/ INTRAOCULAR LENS  IMPLANT, BILATERAL Bilateral   . COLONOSCOPY    . CORONARY ANGIOPLASTY  03/29/92   PTCA to LCX  . CORONARY ARTERY BYPASS GRAFT  1997   LIMA-LAD; VG-diag; seq VG-1st OM & distal LCX; VG-RCA  . CORONARY ATHERECTOMY N/A 09/01/2017   Procedure: CORONARY ATHERECTOMY;  Surgeon: Jettie Booze, MD;  Location: [facility];  Service: Cardiovascular;  Laterality: N/A;  . CORONARY ATHERECTOMY  10/01/2017  . CORONARY ATHERECTOMY N/A 10/01/2017   Procedure: CORONARY ATHERECTOMY;  Surgeon: Leonie Man, MD;  Location: [facility];  Service: Cardiovascular;  Laterality: N/A;  . ESOPHAGOGASTRODUODENOSCOPY (EGD) WITH PROPOFOL N/A 10/20/2017   Procedure: ESOPHAGOGASTRODUODENOSCOPY (EGD) WITH PROPOFOL;  Surgeon: Doran Stabler, MD;  [facility];  Service: Gastroenterology;  Laterality: N/A;  . ESOPHAGOGASTRODUODENOSCOPY (EGD) WITH PROPOFOL N/A 03/29/2018   Procedure: ESOPHAGOGASTRODUODENOSCOPY (EGD) WITH PROPOFOL;  Surgeon: Doran Stabler, MD;  [facility];  Service: Gastroenterology;  Laterality: N/A;  . INSERT / REPLACE / REMOVE PACEMAKER  11/28/1997   pacesetter--ERI 2011  . IR IMAGING GUIDED PORT INSERTION  02/02/2019  . LEFT HEART CATH N/A 09/01/2017   Procedure: Left Heart Cath;  Surgeon: Jettie Booze, MD;  Location: [facility];  Service: Cardiovascular;  Laterality: N/A;  . LEFT HEART CATH AND CORS/GRAFTS ANGIOGRAPHY N/A 08/28/2017   Procedure: LEFT HEART CATH AND CORS/GRAFTS ANGIOGRAPHY;  Surgeon: Leonie Man, MD;  Location: [facility];  Service: Cardiovascular;  Laterality: N/A;  . PACEMAKER GENERATOR CHANGE  08/15/2009   St. Jude accent  . SUPRAPUBIC PROSTATECTOMY    . TEE  WITHOUT CARDIOVERSION N/A 06/12/2015   Procedure: TRANSESOPHAGEAL ECHOCARDIOGRAM (TEE);  Surgeon: Sherren Mocha, MD;  Location: [facility];  Service: Open Heart Surgery;  Laterality: N/A;  . TONSILLECTOMY    . TRANSCATHETER AORTIC VALVE REPLACEMENT, TRANSFEMORAL N/A 06/12/2015   Procedure: TRANSCATHETER AORTIC VALVE REPLACEMENT, TRANSFEMORAL, possible transpical;  Surgeon: Sherren Mocha, MD;  Location: [facility];  Service: Open Heart Surgery;  Laterality: N/A;       Family History  Problem Relation Age of Onset  . Colon cancer Father   . Cancer Brother   . Heart Problems Brother   . Heart Problems Sister   . CAD Other   . Stomach cancer Neg Hx   . Pancreatic cancer Neg Hx   . Esophageal cancer Neg Hx     Social History   Tobacco Use  . Smoking status: Former Smoker    Packs/day: 2.00    Years: 48.00    Pack years: 96.00    Types: Cigarettes    Quit date: 03/18/1995    Years since quitting: 24.3  . Smokeless tobacco: Never Used  Substance Use Topics  . Alcohol use:  No    Comment: 09/01/2017 "nothing in the last couple years"  . Drug use: Never    Home Medications Prior to Admission medications   Medication Sig Start Date End Date Taking? Authorizing Provider  acetaminophen (TYLENOL) 325 MG tablet Take 650 mg by mouth 2 (two) times daily.   Yes [provider]  atorvastatin (LIPITOR) 40 MG tablet Take 1 tablet (40 mg total) by mouth daily at 6 PM. 08/28/17  Yes Kayleen Memos, DO  HYDROcodone-acetaminophen (HYCET) 7.5-325 mg/15 ml solution Take 5-10 mLs by mouth every 6 (six) hours as needed for moderate pain. 06/29/19  Yes Owens Shark, NP  lidocaine-prilocaine (EMLA) cream Apply to port site one hour prior to use 01/20/19  Yes Owens Shark, NP  losartan (COZAAR) 25 MG tablet TAKE 1 TABLET BY MOUTH DAILY Patient taking differently: Take 25 mg by mouth at bedtime.  12/08/18  Yes Daune Perch, NP  metoprolol tartrate (LOPRESSOR) 25 MG tablet TAKE 1 TABLET BY MOUTH TWICE DAILY Patient taking differently: Take 25 mg by mouth 2 (two) times daily.  05/03/19  Yes Baldwin Jamaica, PA-C  nitroGLYCERIN (NITROSTAT) 0.4 MG SL tablet PLACE 1 TABLET UNDER THE TONGUE EVERY 5 MINUTES AS NEEDED FOR CHEST PAIN UP TO 3 DOSES, IF SYMPTOMS PERSIST CALL 911 Patient taking differently: Place 0.4 mg under the tongue every 5 (five) minutes as needed for chest pain.  01/05/19  Yes Lyda Jester M, PA-C  NON FORMULARY Apply 1 application topically See admin instructions. Tridema pain cream: Rub into both shoulders 2 times a day   Yes [provider]  pantoprazole (PROTONIX) 40 MG tablet TAKE 1 TABLET BY MOUTH EVERY DAY Patient taking differently: Take 40 mg by mouth daily.  03/02/19  Yes Turner, Eber Hong, MD  prochlorperazine (COMPAZINE) 5 MG tablet Take 1 tablet (5 mg total) by mouth every 6 (six) hours as needed for nausea or vomiting. 01/20/19  Yes Owens Shark, NP  senna (SENOKOT) 8.6 MG tablet Take 1 tablet by mouth as needed for constipation.   Yes  [provider]    Allergies    Patient has no known allergies.  Review of Systems   Review of Systems  Constitutional: Negative for appetite change and fatigue.  HENT: Negative for congestion, ear discharge and sinus pressure.   Eyes: Negative for discharge.  Respiratory: Negative for cough.        Hemoptysis  Cardiovascular: Negative for chest pain.  Gastrointestinal: Negative for abdominal pain and diarrhea.  Genitourinary: Negative for frequency and hematuria.  Musculoskeletal: Negative for back pain.  Skin: Negative for rash.  Neurological: Positive for weakness. Negative for seizures and headaches.  Psychiatric/Behavioral: Negative for hallucinations.    Physical Exam Updated Vital Signs BP 120/76 (BP Location: Left Arm)   Pulse 100   Temp 97.6 F (36.4 C) (Oral)   Resp 16   SpO2 93%   Physical Exam Vitals and nursing note reviewed.  Constitutional:      Appearance: He is well-developed.  HENT:     Head: Normocephalic.     Nose: Nose normal.  Eyes:     General: No scleral icterus.    Conjunctiva/sclera: Conjunctivae normal.  Neck:     Thyroid: No thyromegaly.  Cardiovascular:     Rate and Rhythm: Normal rate and regular rhythm.     Heart sounds: No murmur. No friction rub. No gallop.   Pulmonary:     Breath sounds: No stridor. No wheezing or rales.     Comments: Coughing up blood Chest:     Chest wall: No tenderness.  Abdominal:     General: There is no distension.     Tenderness: There is no abdominal tenderness. There is no rebound.  Musculoskeletal:        General: Normal range of motion.     Cervical back: Neck supple.  Lymphadenopathy:     Cervical: No cervical adenopathy.  Skin:    Findings: No erythema or rash.  Neurological:     Mental Status: He is alert and oriented to person, place, and time.     Motor: No abnormal muscle tone.     Coordination: Coordination normal.  Psychiatric:        Behavior: Behavior normal.     ED  Results / Procedures / Treatments   Labs (all labs ordered are listed, but only abnormal results are displayed) Labs Reviewed  COMPREHENSIVE METABOLIC PANEL - Abnormal; Notable for the following components:      Result Value   Glucose, Bld 105 (*)    Calcium 8.7 (*)    Albumin 2.9 (*)    Alkaline Phosphatase 192 (*)    All other components within normal limits  CBC - Abnormal; Notable for the following components:   WBC 3.6 (*)    RBC 2.89 (*)    Hemoglobin 8.4 (*)    HCT 27.0 (*)    RDW 15.8 (*)    Platelets 117 (*)    All other components within normal limits  RESPIRATORY PANEL BY RT PCR (FLU A&B, COVID)  PROTIME-INR  TYPE AND SCREEN  PREPARE RBC (CROSSMATCH)  ABO/RH    EKG None  Radiology CT Chest W Contrast  Result Date: 07/19/2019 CLINICAL DATA:  Worsening hemoptysis over the past 2 weeks. History of esophageal cancer. EXAM: CT CHEST WITH CONTRAST TECHNIQUE: Multidetector CT imaging of the chest was performed during intravenous contrast administration. CONTRAST:  70mL OMNIPAQUE IOHEXOL 300 MG/ML  SOLN COMPARISON:  CT chest dated May 20, 2019. FINDINGS: Cardiovascular: Normal heart size status post CABG and AVR. No thoracic aortic aneurysm or dissection. Coronary, aortic arch, and branch vessel atherosclerotic vascular disease. No central pulmonary embolism. Unchanged right chest wall pacemaker and left chest wall port catheter. Mediastinum/Nodes: Progressive enlargement of the ill-defined mass associated with the esophagus measuring 4.5 x 3.1 x 5.9 cm,  previously 3.3 x 2.6 x 4.1 cm. The mass now invades the posterior wall of the trachea (series 4, image 87; series 2, images 27-35). There is some new fluid signal and gas within the mass that may reflect necrosis. No enlarged mediastinal, hilar, or axillary lymph nodes. The thyroid gland is unremarkable. Lungs/Pleura: Centrilobular paraseptal emphysema again noted. Unchanged juxtapleural scarring in the posterior right upper lobe.  Chronic mild central peribronchial thickening. No focal consolidation, pleural effusion, or pneumothorax. Previously seen 10 mm nodular density in the left upper lobe has decreased in size, now measuring 6 mm (series 7, image 74). Unchanged 5 mm ground-glass nodule in the left upper lobe (series 7, image 63), stable since March 2017. Small calcified granuloma in the left upper lobe again noted. No new pulmonary nodule. Upper Abdomen: No acute abnormality. Unchanged cirrhosis. Unchanged bilateral nonobstructive renal calculi. Musculoskeletal: No chest wall abnormality. No acute or significant osseous findings. IMPRESSION: 1. Progressive enlargement of the necrotic mass associated with the upper esophagus, now measuring 4.5 x 3.1 x 5.9 cm, previously 3.3 x 2.6 x 4.1 cm, with new invasion of the posterior wall of the trachea. 2. Previously seen 10 mm nodular density in the left upper lobe has decreased in size, now measuring 6 mm, consistent with resolving infection or inflammation. 3. Unchanged cirrhosis. 4. Unchanged bilateral nonobstructive nephrolithiasis. 5. Aortic Atherosclerosis (ICD10-I70.0) and Emphysema (ICD10-J43.9). Electronically Signed   By: Titus Dubin M.D.   On: 07/19/2019 13:42   DG Chest Port 1 View  Result Date: 07/19/2019 CLINICAL DATA:  Cough, history of throat cancer EXAM: PORTABLE CHEST 1 VIEW COMPARISON:  12/15/2018 FINDINGS: Left chest wall port catheter tip overlies SVC. No new consolidation or edema. No pleural effusion or pneumothorax. Stable cardiomediastinal contours with normal heart size. Valve replacement and right chest wall dual lead pacemaker noted. IMPRESSION: No acute process in the chest. Electronically Signed   By: Macy Mis M.D.   On: 07/19/2019 12:11    Procedures Procedures (including critical care time)  Medications Ordered in ED Medications  0.9 %  sodium chloride infusion (has no administration in time range)  sodium chloride (PF) 0.9 % injection (has  no administration in time range)  sodium chloride 0.9 % bolus 500 mL (0 mLs Intravenous Stopped 07/19/19 1307)  iohexol (OMNIPAQUE) 300 MG/ML solution 75 mL (75 mLs Intravenous Contrast Given 07/19/19 1310)    ED Course  I have reviewed the triage vital signs and the nursing notes.  Pertinent labs & imaging results that were available during my care of the patient were reviewed by me and considered in my medical decision making (see chart for details).    CRITICAL CARE Performed by: Milton Ferguson Total critical care time: 35 minutes Critical care time was exclusive of separately billable procedures and treating other patients. Critical care was necessary to treat or prevent imminent or life-threatening deterioration. Critical care was time spent personally by me on the following activities: development of treatment plan with patient and/or surrogate as well as nursing, discussions with consultants, evaluation of patient's response to treatment, examination of patient, obtaining history from patient or surrogate, ordering and performing treatments and interventions, ordering and review of laboratory studies, ordering and review of radiographic studies, pulse oximetry and re-evaluation of patient's condition.  MDM Rules/Calculators/A&P                      Patient with hemoptysis.  His hemoglobin has been stable at 8.4.  I spoke with  his oncologist and Dr. Junious Silk  will make arrangements for radiation therapy and see the patient tomorrow.  Hospitalist will admit     This patient presents to the ED for concern of hemoptysis, this involves an extensive number of treatment options, and is a complaint that carries with it a high risk of complications and morbidity.  The differential diagnosis includes lung cancer throat cancer   Lab Tests:   I Ordered, reviewed, and interpreted labs, which included CBC that showed anemia  Medicines ordered:   I ordered medication IV fluids for  dehydration  Imaging Studies ordered:   I ordered imaging studies which included CT abdomen and  I independently visualized and interpreted imaging which showed worsening of esophageal tumor  Additional history obtained:   Additional history obtained from his oncologist  Previous records obtained and reviewed  Consultations Obtained:   I consulted oncology and hospitalist and discussed lab and imaging findings  Reevaluation:  After the interventions stated above, I reevaluated the patient and found unchanged  Critical Interventions:  .   Final Clinical Impression(s) / ED Diagnoses Final diagnoses:  Hemoptysis    Rx / DC Orders ED Discharge Orders    None       Milton Ferguson, MD 07/19/19 1506

## 2019-07-19 NOTE — Telephone Encounter (Signed)
I called the patient's daughter in law Alice and left a voicemail for her letting her know that Dr. Gearldine Shown NP had reached out and let us know he was coming into the hospital due to progressive hematemesis/hemoptysis from his recurrent esophageal cancer. We have also had a hard time trying to coordinate outpatient palliative care options for this patient so I let her know I'd also reach out to palliative care so they can engage in goals of care discussion with the patient as well as long term outpt follow up given our previous conversations.    Carola Rhine, PAC

## 2019-07-19 NOTE — H&P (Addendum)
[address] Brooke Bonito. is an 85 y.o. male.   Chief Complaint: coughing up blood  HPI:  Hx esophageal CA Worsening hematemesis x2 weeks w/ fatigue Bringing up frank blood, feels like he's choking on it, worse w/ lying down flat No SOB Pt reports otherwise feeling okay   In ED: Per ED MD, spoke w/ Onc, advised keep for obs, will work on getting radiation set up ASAP,         Past Medical History:  Diagnosis Date  . AAA (abdominal aortic aneurysm) ([place])    a. Last duplex - 3.8cm 01/2013 - due 01/2014.  [name] Acne rosacea   . Alcoholic hepatitis with ascites 08/28/2017  . Anginal pain ([place]) 1996  . Aortic stenosis, mild    Last echo 05/04/12 +LVH  . Arthritis    "shoulders" (09/01/2017)  . Back pain    hx epidural injections  . Baker's cyst    Lt.  [name] CAD (coronary artery disease)    a. s/p MI/PTCA - Cx 1994. b. s/p CABG x5 in 1997. c. Abnl nuc 2003- occ SVG-RCA, patent seq SVG-OM1-dCxOM, patentl LIMA-LAD, patent, SVG-diag. d. Normal nuc 09/2012.  . Carotid artery disease ([place])    a. Duplex 01/2013: mildly abnormal. Mild plaque without significant diameter reduction. Repeat recommended 11/15.  [name] COPD (chronic obstructive pulmonary disease) ([place])   . Dizziness and giddiness 08/04/2013  . GERD (gastroesophageal reflux disease)   . H/O: GI bleed    mild, neg. colonoscopy  . Heart murmur   . HOH (hard of hearing)   . HTN (hypertension)   . Hyperlipidemia   . Hypertensive heart disease   . Myocardial infarction ([place]) 1994  . OSA on CPAP   . OSA treated with BiPAP 11/20/2015   Severe with [facility] 38/hr  . PAF (paroxysmal atrial fibrillation) ([place])    a. Remote per records.   . Pneumonia    history  . Presence of permanent cardiac pacemaker    DOI 1999 with change out 2006; St Jude  . Prostate cancer ([place]) ~ 2010   a. s/p radical retropubic prostatectomy with bilateral pelvic lymph node dissection  . PVD (peripheral vascular disease) ([place])    a. Rt ext iliac stenosis, last PV cath 2003,  moderate stenosis last Lower Ext Dopplers 12/16/11 - Rt. ABI 0.96  Lt ABI 1.0.  . Sick sinus syndrome ([place])    a. placed 1999, gen change 2011 - St. Jude.  . Sinus node dysfunction ([place]) 01/20/2013  . Syncope 11/28/97  . Thrombocytopenia ([facility])    chronic, mild  . Valvular heart disease    a. Echo 04/2012: mild-mod AS, mild AI, mild MR.    Past Surgical History:  Procedure Laterality Date  . APPENDECTOMY    . BIOPSY  10/20/2017   Procedure: BIOPSY;  Surgeon: Doran Stabler, MD;  Location: [facility];  Service: Gastroenterology;;  . CARDIAC CATHETERIZATION  2003   VG to RCA occluded, other grafts patent  . CARDIAC CATHETERIZATION N/A 05/04/2015   Procedure: Right/Left Heart Cath and Coronary/Graft Angiography;  Surgeon: Sherren Mocha, MD;  Location: [facility];  Service: Cardiovascular;  Laterality: N/A;  . CARDIAC CATHETERIZATION  09/01/2017  . CARDIAC VALVE REPLACEMENT    . CATARACT EXTRACTION W/ INTRAOCULAR LENS  IMPLANT, BILATERAL Bilateral   . COLONOSCOPY    . CORONARY ANGIOPLASTY  03/29/92   PTCA to LCX  . CORONARY ARTERY BYPASS GRAFT  1997   LIMA-LAD; VG-diag; seq VG-1st OM & distal LCX;  VG-RCA  . CORONARY ATHERECTOMY N/A 09/01/2017   Procedure: CORONARY ATHERECTOMY;  Surgeon: Jettie Booze, MD;  Location: [facility];  Service: Cardiovascular;  Laterality: N/A;  . CORONARY ATHERECTOMY  10/01/2017  . CORONARY ATHERECTOMY N/A 10/01/2017   Procedure: CORONARY ATHERECTOMY;  Surgeon: Leonie Man, MD;  Location: [facility];  Service: Cardiovascular;  Laterality: N/A;  . ESOPHAGOGASTRODUODENOSCOPY (EGD) WITH PROPOFOL N/A 10/20/2017   Procedure: ESOPHAGOGASTRODUODENOSCOPY (EGD) WITH PROPOFOL;  Surgeon: Doran Stabler, MD;  [facility];  Service: Gastroenterology;  Laterality: N/A;  . ESOPHAGOGASTRODUODENOSCOPY (EGD) WITH PROPOFOL N/A 03/29/2018   Procedure: ESOPHAGOGASTRODUODENOSCOPY (EGD) WITH PROPOFOL;  Surgeon: Doran Stabler, MD;   [facility];  Service: Gastroenterology;  Laterality: N/A;  . INSERT / REPLACE / REMOVE PACEMAKER  11/28/1997   pacesetter--ERI 2011  . IR IMAGING GUIDED PORT INSERTION  02/02/2019  . LEFT HEART CATH N/A 09/01/2017   Procedure: Left Heart Cath;  Surgeon: Jettie Booze, MD;  Location: [facility];  Service: Cardiovascular;  Laterality: N/A;  . LEFT HEART CATH AND CORS/GRAFTS ANGIOGRAPHY N/A 08/28/2017   Procedure: LEFT HEART CATH AND CORS/GRAFTS ANGIOGRAPHY;  Surgeon: Leonie Man, MD;  Location: [facility];  Service: Cardiovascular;  Laterality: N/A;  . PACEMAKER GENERATOR CHANGE  08/15/2009   St. Jude accent  . SUPRAPUBIC PROSTATECTOMY    . TEE WITHOUT CARDIOVERSION N/A 06/12/2015   Procedure: TRANSESOPHAGEAL ECHOCARDIOGRAM (TEE);  Surgeon: Sherren Mocha, MD;  Location: [facility];  Service: Open Heart Surgery;  Laterality: N/A;  . TONSILLECTOMY    . TRANSCATHETER AORTIC VALVE REPLACEMENT, TRANSFEMORAL N/A 06/12/2015   Procedure: TRANSCATHETER AORTIC VALVE REPLACEMENT, TRANSFEMORAL, possible transpical;  Surgeon: Sherren Mocha, MD;  Location: [facility];  Service: Open Heart Surgery;  Laterality: N/A;    Family History  Problem Relation Age of Onset  . Colon cancer Father   . Cancer Brother   . Heart Problems Brother   . Heart Problems Sister   . CAD Other   . Stomach cancer Neg Hx   . Pancreatic cancer Neg Hx   . Esophageal cancer Neg Hx    Social History:  reports that he quit smoking about 24 years ago. His smoking use included cigarettes. He has a 96.00 pack-year smoking history. He has never used smokeless tobacco. He reports that he does not drink alcohol or use drugs.  Allergies: No Known Allergies  (Not in a hospital admission)   Results for orders placed or performed during the hospital encounter of 07/19/19 (from the past 48 hour(s))  Comprehensive metabolic panel     Status: Abnormal   Collection Time: 07/19/19 10:48 AM  Result Value Ref Range    Sodium 138 135 - 145 mmol/L   Potassium 3.7 3.5 - 5.1 mmol/L   Chloride 107 98 - 111 mmol/L   CO2 22 22 - 32 mmol/L   Glucose, Bld 105 (H) 70 - 99 mg/dL    Comment: Glucose reference range applies only to samples taken after fasting for at least 8 hours.   BUN 18 8 - 23 mg/dL   Creatinine, Ser 0.96 0.61 - 1.24 mg/dL   Calcium 8.7 (L) 8.9 - 10.3 mg/dL   Total Protein 7.1 6.5 - 8.1 g/dL   Albumin 2.9 (L) 3.5 - 5.0 g/dL   AST 27 15 - 41 U/L   ALT 15 0 - 44 U/L   Alkaline Phosphatase 192 (H) 38 - 126 U/L   Total  Bilirubin 1.1 0.3 - 1.2 mg/dL   GFR calc non Af Amer >60 >60 mL/min   GFR calc Af Amer >60 >60 mL/min   Anion gap 9 5 - 15    Comment: Performed at [facility], [place] [address] [place], [place] [address]  CBC     Status: Abnormal   Collection Time: 07/19/19 10:48 AM  Result Value Ref Range   WBC 3.6 (L) 4.0 - 10.5 K/uL   RBC 2.89 (L) 4.22 - 5.81 MIL/uL   Hemoglobin 8.4 (L) 13.0 - 17.0 g/dL   HCT 27.0 (L) 39.0 - 52.0 %   MCV 93.4 80.0 - 100.0 fL   MCH 29.1 26.0 - 34.0 pg   MCHC 31.1 30.0 - 36.0 g/dL   RDW 15.8 (H) 11.5 - 15.5 %   Platelets 117 (L) 150 - 400 K/uL    Comment: Immature Platelet Fraction may be clinically indicated, consider ordering this additional test [id number]    nRBC 0.0 0.0 - 0.2 %    Comment: Performed at [facility], [place] [address], [place], [place] [address]  Type and screen Golf     Status: None (Preliminary result)   Collection Time: 07/19/19 10:48 AM  Result Value Ref Range   ABO/RH(D) A POS    Antibody Screen NEG    Sample Expiration 07/22/2019,2359    Unit Number [id number]    Blood Component Type RED CELLS,LR    Unit division 00    Status of Unit ALLOCATED    Transfusion Status OK TO TRANSFUSE    Crossmatch Result      Compatible Performed at [facility], [place] [address], [place], [place] [address]    Unit Number [id number]    Blood  Component Type RED CELLS,LR    Unit division 00    Status of Unit ALLOCATED    Transfusion Status OK TO TRANSFUSE    Crossmatch Result Compatible    Unit Number [id number]    Blood Component Type RED CELLS,LR    Unit division 00    Status of Unit ALLOCATED    Transfusion Status OK TO TRANSFUSE    Crossmatch Result Compatible   ABO/Rh     Status: None   Collection Time: 07/19/19 10:48 AM  Result Value Ref Range   ABO/RH(D)      A POS Performed at [facility], [place] [address] [place], [place] [address]   Protime-INR     Status: None   Collection Time: 07/19/19 10:53 AM  Result Value Ref Range   Prothrombin Time 14.7 11.4 - 15.2 seconds   INR 1.2 0.8 - 1.2    Comment: (NOTE) INR goal varies based on device and disease states. Performed at [facility], [place] [address], [place], [place] [address]   Respiratory Panel by RT PCR (Flu A&B, Covid) - Nasopharyngeal Swab     Status: None   Collection Time: 07/19/19 10:54 AM   Specimen: Nasopharyngeal Swab  Result Value Ref Range   SARS Coronavirus 2 by RT PCR NEGATIVE NEGATIVE    Comment: (NOTE) SARS-CoV-2 target nucleic acids are NOT DETECTED. The SARS-CoV-2 RNA is generally detectable in upper respiratoy specimens during the acute phase of infection. The lowest concentration of SARS-CoV-2 viral copies this assay can detect is 131 copies/mL. A negative result does not preclude SARS-Cov-2 infection and should not be used as the sole basis for treatment or other patient management decisions. A negative  result may occur with  improper specimen collection/handling, submission of specimen other than nasopharyngeal swab, presence of viral mutation(s) within the areas targeted by this assay, and inadequate number of viral copies (<131 copies/mL). A negative result must be combined with clinical observations, patient history, and epidemiological information. The expected result is Negative. Fact  Sheet for Patients:  PinkCheek.be Fact Sheet for Healthcare Providers:  GravelBags.it This test is not yet ap proved or cleared by the [place] FDA and  has been authorized for detection and/or diagnosis of SARS-CoV-2 by FDA under an Emergency Use Authorization (EUA). This EUA will remain  in effect (meaning this test can be used) for the duration of the COVID-19 declaration under Section 564(b)(1) of the Act, 21 U.S.C. section 360bbb-3(b)(1), unless the authorization is terminated or revoked sooner.    Influenza A by PCR NEGATIVE NEGATIVE   Influenza B by PCR NEGATIVE NEGATIVE    Comment: (NOTE) The Xpert Xpress SARS-CoV-2/FLU/RSV assay is intended as an aid in  the diagnosis of influenza from Nasopharyngeal swab specimens and  should not be used as a sole basis for treatment. Nasal washings and  aspirates are unacceptable for Xpert Xpress SARS-CoV-2/FLU/RSV  testing. Fact Sheet for Patients: PinkCheek.be Fact Sheet for Healthcare Providers: GravelBags.it This test is not yet approved or cleared by the [place] FDA and  has been authorized for detection and/or diagnosis of SARS-CoV-2 by  FDA under an Emergency Use Authorization (EUA). This EUA will remain  in effect (meaning this test can be used) for the duration of the  Covid-19 declaration under Section 564(b)(1) of the Act, 21  U.S.C. section 360bbb-3(b)(1), unless the authorization is  terminated or revoked. Performed at [facility], [place] [address] [place], [place] [address]   Prepare RBC (crossmatch)     Status: None   Collection Time: 07/19/19 11:14 AM  Result Value Ref Range   Order Confirmation      ORDER PROCESSED BY BLOOD BANK Performed at [place] [address] [place], [place] [address]    CT Chest W Contrast  Result Date: 07/19/2019 CLINICAL  DATA:  Worsening hemoptysis over the past 2 weeks. History of esophageal cancer. EXAM: CT CHEST WITH CONTRAST TECHNIQUE: Multidetector CT imaging of the chest was performed during intravenous contrast administration. CONTRAST:  77mL OMNIPAQUE IOHEXOL 300 MG/ML  SOLN COMPARISON:  CT chest dated May 20, 2019. FINDINGS: Cardiovascular: Normal heart size status post CABG and AVR. No thoracic aortic aneurysm or dissection. Coronary, aortic arch, and branch vessel atherosclerotic vascular disease. No central pulmonary embolism. Unchanged right chest wall pacemaker and left chest wall port catheter. Mediastinum/Nodes: Progressive enlargement of the ill-defined mass associated with the esophagus measuring 4.5 x 3.1 x 5.9 cm, previously 3.3 x 2.6 x 4.1 cm. The mass now invades the posterior wall of the trachea (series 4, image 87; series 2, images 27-35). There is some new fluid signal and gas within the mass that may reflect necrosis. No enlarged mediastinal, hilar, or axillary lymph nodes. The thyroid gland is unremarkable. Lungs/Pleura: Centrilobular paraseptal emphysema again noted. Unchanged juxtapleural scarring in the posterior right upper lobe. Chronic mild central peribronchial thickening. No focal consolidation, pleural effusion, or pneumothorax. Previously seen 10 mm nodular density in the left upper lobe has decreased in size, now measuring 6 mm (series 7, image 74). Unchanged 5 mm ground-glass nodule in the left upper lobe (series 7, image 63), stable since March 2017. Small calcified granuloma in the left upper lobe  again noted. No new pulmonary nodule. Upper Abdomen: No acute abnormality. Unchanged cirrhosis. Unchanged bilateral nonobstructive renal calculi. Musculoskeletal: No chest wall abnormality. No acute or significant osseous findings. IMPRESSION: 1. Progressive enlargement of the necrotic mass associated with the upper esophagus, now measuring 4.5 x 3.1 x 5.9 cm, previously 3.3 x 2.6 x 4.1 cm, with  new invasion of the posterior wall of the trachea. 2. Previously seen 10 mm nodular density in the left upper lobe has decreased in size, now measuring 6 mm, consistent with resolving infection or inflammation. 3. Unchanged cirrhosis. 4. Unchanged bilateral nonobstructive nephrolithiasis. 5. Aortic Atherosclerosis (ICD10-I70.0) and Emphysema (ICD10-J43.9). Electronically Signed   By: Titus Dubin M.D.   On: 07/19/2019 13:42   DG Chest Port 1 View  Result Date: 07/19/2019 CLINICAL DATA:  Cough, history of throat cancer EXAM: PORTABLE CHEST 1 VIEW COMPARISON:  12/15/2018 FINDINGS: Left chest wall port catheter tip overlies SVC. No new consolidation or edema. No pleural effusion or pneumothorax. Stable cardiomediastinal contours with normal heart size. Valve replacement and right chest wall dual lead pacemaker noted. IMPRESSION: No acute process in the chest. Electronically Signed   By: Macy Mis M.D.   On: 07/19/2019 12:11    Review of Systems  Constitutional: Positive for appetite change and fatigue. Negative for activity change.  HENT: Positive for trouble swallowing.   Respiratory: Positive for cough, choking and chest tightness. Negative for apnea, shortness of breath and wheezing.   Cardiovascular: Negative for chest pain, palpitations and leg swelling.  Gastrointestinal: Negative for abdominal distention and abdominal pain.  Musculoskeletal: Negative for arthralgias.  Neurological: Negative for dizziness, syncope and light-headedness.  Psychiatric/Behavioral: Negative for confusion.    Blood pressure 120/76, pulse 100, temperature 97.6 F (36.4 C), temperature source Oral, resp. rate 16, SpO2 93 %. Physical Exam  Constitutional: He is oriented to person, place, and time. He appears well-developed and well-nourished. No distress.  HENT:  Head: Normocephalic and atraumatic.  Eyes: Conjunctivae are normal.  Cardiovascular: Normal rate and regular rhythm.  Murmur heard. Respiratory:  Breath sounds normal. No respiratory distress.  GI: Soft. Bowel sounds are normal. He exhibits no distension.  Musculoskeletal:        General: Normal range of motion.     Cervical back: Neck supple.  Neurological: He is alert and oriented to person, place, and time.  Skin: Skin is warm and dry.  Psychiatric: He has a normal mood and affect. Thought content normal.     Assessment/Plan  Bleeding esophageal malignancy CT shows "Progressive enlargement of the necrotic mass associated with the upper esophagus, now measuring 4.5 x 3.1 x 5.9 cm, previously 3.3 x 2.6 x 4.1 cm, with new invasion of the posterior wall of the trachea" Hgb stable over past 3+ mos --> monitor serial CBC --> PRBC prn if significant decrease Hgb --> Spoke w/ [facility] Colleen - she will d/w attending re: see today if Hgb drops, vs see tomorrow to eval for possible EGD vs other recs  --> oncology following  --> bleed risk, will provide mechanical VTE ppx   Cardiac: CAD c/p CABG, Hx aortic stenosis s/p replacement, Hx Afib on chart bu tpt not aware of this dx and not on anticoagulation, s/p pacemaker d/t SSS, AAA, HTN Appears to be in sinus rhythm now per exam/tele BP stable --> home meds  OSA  --> CPAP qhs   Goals of care: Pt and daughter agree on full code Prognosis seems poor per oncology --> palliative care  consult          Emeterio Reeve, DO 07/19/2019, 3:02 PM

## 2019-07-19 NOTE — ED Notes (Signed)
Blood bank has blood ready for this patient, notified Kaitlyn,RN.

## 2019-07-20 DIAGNOSIS — Z515 Encounter for palliative care: Secondary | ICD-10-CM

## 2019-07-20 DIAGNOSIS — R042 Hemoptysis: Secondary | ICD-10-CM

## 2019-07-20 DIAGNOSIS — R490 Dysphonia: Secondary | ICD-10-CM | POA: Diagnosis not present

## 2019-07-20 DIAGNOSIS — I252 Old myocardial infarction: Secondary | ICD-10-CM | POA: Diagnosis not present

## 2019-07-20 DIAGNOSIS — K228 Other specified diseases of esophagus: Secondary | ICD-10-CM | POA: Diagnosis not present

## 2019-07-20 DIAGNOSIS — K922 Gastrointestinal hemorrhage, unspecified: Secondary | ICD-10-CM | POA: Diagnosis not present

## 2019-07-20 DIAGNOSIS — I495 Sick sinus syndrome: Secondary | ICD-10-CM | POA: Diagnosis present

## 2019-07-20 DIAGNOSIS — Z66 Do not resuscitate: Secondary | ICD-10-CM | POA: Diagnosis present

## 2019-07-20 DIAGNOSIS — I480 Paroxysmal atrial fibrillation: Secondary | ICD-10-CM | POA: Diagnosis present

## 2019-07-20 DIAGNOSIS — C154 Malignant neoplasm of middle third of esophagus: Principal | ICD-10-CM

## 2019-07-20 DIAGNOSIS — D500 Iron deficiency anemia secondary to blood loss (chronic): Secondary | ICD-10-CM

## 2019-07-20 DIAGNOSIS — K219 Gastro-esophageal reflux disease without esophagitis: Secondary | ICD-10-CM | POA: Diagnosis present

## 2019-07-20 DIAGNOSIS — I739 Peripheral vascular disease, unspecified: Secondary | ICD-10-CM | POA: Diagnosis present

## 2019-07-20 DIAGNOSIS — R509 Fever, unspecified: Secondary | ICD-10-CM | POA: Diagnosis not present

## 2019-07-20 DIAGNOSIS — Z7189 Other specified counseling: Secondary | ICD-10-CM

## 2019-07-20 DIAGNOSIS — K5900 Constipation, unspecified: Secondary | ICD-10-CM | POA: Diagnosis present

## 2019-07-20 DIAGNOSIS — K1230 Oral mucositis (ulcerative), unspecified: Secondary | ICD-10-CM | POA: Diagnosis present

## 2019-07-20 DIAGNOSIS — D630 Anemia in neoplastic disease: Secondary | ICD-10-CM | POA: Diagnosis present

## 2019-07-20 DIAGNOSIS — E440 Moderate protein-calorie malnutrition: Secondary | ICD-10-CM | POA: Diagnosis present

## 2019-07-20 DIAGNOSIS — R1310 Dysphagia, unspecified: Secondary | ICD-10-CM | POA: Diagnosis not present

## 2019-07-20 DIAGNOSIS — J690 Pneumonitis due to inhalation of food and vomit: Secondary | ICD-10-CM | POA: Diagnosis not present

## 2019-07-20 DIAGNOSIS — Z20822 Contact with and (suspected) exposure to covid-19: Secondary | ICD-10-CM | POA: Diagnosis present

## 2019-07-20 DIAGNOSIS — T451X5A Adverse effect of antineoplastic and immunosuppressive drugs, initial encounter: Secondary | ICD-10-CM | POA: Diagnosis present

## 2019-07-20 DIAGNOSIS — I714 Abdominal aortic aneurysm, without rupture: Secondary | ICD-10-CM | POA: Diagnosis present

## 2019-07-20 DIAGNOSIS — C159 Malignant neoplasm of esophagus, unspecified: Secondary | ICD-10-CM | POA: Diagnosis not present

## 2019-07-20 DIAGNOSIS — R627 Adult failure to thrive: Secondary | ICD-10-CM | POA: Diagnosis present

## 2019-07-20 DIAGNOSIS — K920 Hematemesis: Secondary | ICD-10-CM | POA: Diagnosis present

## 2019-07-20 DIAGNOSIS — C7839 Secondary malignant neoplasm of other respiratory organs: Secondary | ICD-10-CM | POA: Diagnosis present

## 2019-07-20 DIAGNOSIS — J9601 Acute respiratory failure with hypoxia: Secondary | ICD-10-CM | POA: Diagnosis not present

## 2019-07-20 DIAGNOSIS — G4733 Obstructive sleep apnea (adult) (pediatric): Secondary | ICD-10-CM | POA: Diagnosis present

## 2019-07-20 DIAGNOSIS — D61810 Antineoplastic chemotherapy induced pancytopenia: Secondary | ICD-10-CM | POA: Diagnosis present

## 2019-07-20 DIAGNOSIS — I2510 Atherosclerotic heart disease of native coronary artery without angina pectoris: Secondary | ICD-10-CM | POA: Diagnosis present

## 2019-07-20 LAB — BASIC METABOLIC PANEL
Anion gap: 8 (ref 5–15)
BUN: 15 mg/dL (ref 8–23)
CO2: 24 mmol/L (ref 22–32)
Calcium: 8.4 mg/dL — ABNORMAL LOW (ref 8.9–10.3)
Chloride: 107 mmol/L (ref 98–111)
Creatinine, Ser: 0.95 mg/dL (ref 0.61–1.24)
GFR calc Af Amer: >60 mL/min (ref >60)
GFR calc non Af Amer: >60 mL/min (ref >60)
Glucose, Bld: 88 mg/dL (ref 70–99)
Potassium: 3.8 mmol/L (ref 3.5–5.1)
Sodium: 139 mmol/L (ref 135–145)

## 2019-07-20 LAB — CBC
HCT: 25 % — ABNORMAL LOW (ref 39.0–52.0)
Hemoglobin: 7.7 g/dL — ABNORMAL LOW (ref 13.0–17.0)
MCH: 28.7 pg (ref 26.0–34.0)
MCHC: 30.8 g/dL (ref 30.0–36.0)
MCV: 93.3 fL (ref 80.0–100.0)
Platelets: 93 10*3/uL — ABNORMAL LOW (ref 150–400)
RBC: 2.68 MIL/uL — ABNORMAL LOW (ref 4.22–5.81)
RDW: 15.9 % — ABNORMAL HIGH (ref 11.5–15.5)
WBC: 3 10*3/uL — ABNORMAL LOW (ref 4.0–10.5)
nRBC: 0 % (ref 0.0–0.2)

## 2019-07-20 LAB — HEMOGLOBIN AND HEMATOCRIT, BLOOD
HCT: 27.1 % — ABNORMAL LOW (ref 39.0–52.0)
Hemoglobin: 8.8 g/dL — ABNORMAL LOW (ref 13.0–17.0)

## 2019-07-20 LAB — PREPARE RBC (CROSSMATCH)

## 2019-07-20 MED ORDER — CHLORHEXIDINE GLUCONATE CLOTH 2 % EX PADS
6.0000 | MEDICATED_PAD | Freq: Every day | CUTANEOUS | Status: DC
  Administered 2019-07-20 – 2019-07-27 (×7): 6 via TOPICAL

## 2019-07-20 MED ORDER — MAGNESIUM CITRATE PO SOLN
1.0000 | Freq: Once | ORAL | Status: AC
  Administered 2019-07-20: 18:00:00 1 via ORAL
  Filled 2019-07-20: qty 296

## 2019-07-20 MED ORDER — SODIUM CHLORIDE 0.9% IV SOLUTION: Freq: Once | INTRAVENOUS | Status: AC

## 2019-07-20 MED ORDER — ENSURE ENLIVE PO LIQD
237.0000 mL | Freq: Two times a day (BID) | ORAL | Status: DC
  Administered 2019-07-21 – 2019-07-24 (×2): 237 mL via ORAL

## 2019-07-20 NOTE — Evaluation (Signed)
SLP Cancellation Note  Patient Details Name: [name]. MRN: [id number] DOB: 01-27-1935   Cancelled treatment:       Reason Eval/Treat Not Completed: Other (comment)(give pt with h/o throat cancer and overt dysphagia symptoms, will proceed with MBS in lieu of clinical swallow evaluation.  Thanks for the consult.)   Kathleen Lime, MS [facility] SLP Acute Rehab Services Office [phone]  Macario Golds 07/20/2019, 5:21 PM

## 2019-07-20 NOTE — Progress Notes (Signed)
IP PROGRESS NOTE  Subjective:   [name] is well-known to me with a history of locally recurrent esophagus cancer.  He is currently being treated with nivolumab.  He was admitted yesterday with hemoptysis.  He reports increased hemoptysis for the past 2 weeks.  The hemoptysis keeps him awake at night.  He has a choking sensation.  He also reports increased dysphagia.  He feels "dizzy "with ambulation.  A CT chest revealed enlargement of the esophagus mass with invasion of the posterior wall of the trachea. Objective: Vital signs in last 24 hours: Blood pressure 122/60, pulse 77, temperature 98.7 F (37.1 C), temperature source Oral, resp. rate 18, height '5\' 7"'  (1.702 m), weight 169 lb 12.1 oz (77 kg), SpO2 94 %.  Intake/Output from previous day: 05/04 0701 - 05/05 0700 In: 860 [P.O.:360; IV Piggyback:500] Out: -   Physical Exam:  HEENT: No thrush Lungs: Right greater than left expiratory wheeze/rhonchi Cardiac: Regular rate and rhythm Abdomen: Nontender, no hepatosplenomegaly Extremities: No leg edema   Portacath/PICC-without erythema  Lab Results: Recent Labs    07/19/19 1815 07/20/19 0611  WBC 2.9* 3.0*  HGB 8.0* 7.7*  HCT 25.5* 25.0*  PLT 100* 93*    BMET Recent Labs    07/19/19 1048 07/20/19 0611  NA 138 139  K 3.7 3.8  CL 107 107  CO2 22 24  GLUCOSE 105* 88  BUN 18 15  CREATININE 0.96 0.95  CALCIUM 8.7* 8.4*    Lab Results  Component Value Date   CEA1 2.08 02/07/2019    Studies/Results: CT Chest W Contrast  Result Date: 07/19/2019 CLINICAL DATA:  Worsening hemoptysis over the past 2 weeks. History of esophageal cancer. EXAM: CT CHEST WITH CONTRAST TECHNIQUE: Multidetector CT imaging of the chest was performed during intravenous contrast administration. CONTRAST:  31m OMNIPAQUE IOHEXOL 300 MG/ML  SOLN COMPARISON:  CT chest dated May 20, 2019. FINDINGS: Cardiovascular: Normal heart size status post CABG and AVR. No thoracic aortic aneurysm or  dissection. Coronary, aortic arch, and branch vessel atherosclerotic vascular disease. No central pulmonary embolism. Unchanged right chest wall pacemaker and left chest wall port catheter. Mediastinum/Nodes: Progressive enlargement of the ill-defined mass associated with the esophagus measuring 4.5 x 3.1 x 5.9 cm, previously 3.3 x 2.6 x 4.1 cm. The mass now invades the posterior wall of the trachea (series 4, image 87; series 2, images 27-35). There is some new fluid signal and gas within the mass that may reflect necrosis. No enlarged mediastinal, hilar, or axillary lymph nodes. The thyroid gland is unremarkable. Lungs/Pleura: Centrilobular paraseptal emphysema again noted. Unchanged juxtapleural scarring in the posterior right upper lobe. Chronic mild central peribronchial thickening. No focal consolidation, pleural effusion, or pneumothorax. Previously seen 10 mm nodular density in the left upper lobe has decreased in size, now measuring 6 mm (series 7, image 74). Unchanged 5 mm ground-glass nodule in the left upper lobe (series 7, image 63), stable since March 2017. Small calcified granuloma in the left upper lobe again noted. No new pulmonary nodule. Upper Abdomen: No acute abnormality. Unchanged cirrhosis. Unchanged bilateral nonobstructive renal calculi. Musculoskeletal: No chest wall abnormality. No acute or significant osseous findings. IMPRESSION: 1. Progressive enlargement of the necrotic mass associated with the upper esophagus, now measuring 4.5 x 3.1 x 5.9 cm, previously 3.3 x 2.6 x 4.1 cm, with new invasion of the posterior wall of the trachea. 2. Previously seen 10 mm nodular density in the left upper lobe has decreased in size, now measuring 6  mm, consistent with resolving infection or inflammation. 3. Unchanged cirrhosis. 4. Unchanged bilateral nonobstructive nephrolithiasis. 5. Aortic Atherosclerosis (ICD10-I70.0) and Emphysema (ICD10-J43.9). Electronically Signed   By: Titus Dubin M.D.   On:  07/19/2019 13:42   DG Chest Port 1 View  Result Date: 07/19/2019 CLINICAL DATA:  Cough, history of throat cancer EXAM: PORTABLE CHEST 1 VIEW COMPARISON:  12/15/2018 FINDINGS: Left chest wall port catheter tip overlies SVC. No new consolidation or edema. No pleural effusion or pneumothorax. Stable cardiomediastinal contours with normal heart size. Valve replacement and right chest wall dual lead pacemaker noted. IMPRESSION: No acute process in the chest. Electronically Signed   By: Macy Mis M.D.   On: 07/19/2019 12:11    Medications: I have reviewed the patient's current medications.  Assessment/Plan: 1. 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 Staging CTs 10/26/2017-asymmetric esophageal thickening, no evidence of metastatic disease ? Endoscopic biopsy 10/20/2017-mid esophagus partially obstructing mass, biopsy confirmed squamous cell carcinoma ? PET scan 11/02/2017-wall thickening in the mid esophagus SUV 27 MA: No findings specific for metastatic disease ? Radiation 11/09/2017-12/17/2017 ? Cycle 1 weekly Taxol/carboplatin 11/10/2017 ? Cycle 2 weekly Taxol/carboplatin 11/17/2017 (carboplatin held due to thrombocytopenia) ? Cycle 3 weekly Taxol/carboplatin 12/08/2017 (dosesreduced due to neutropenia, thrombocytopenia) ? Cycle 4 weekly Taxol/carboplatin 12/15/2017 ? Endoscopy 03/29/2018-no mass seen ? CT chest 12/29/2018-2 cm area associated with anterior wall the proximal esophagus, new-nonspecific, changes of cirrhosis ? PD-L1 CPS 5% ? Cycle 1 FOLFOX 02/07/2019 ? Cycle 2 FOLFOX 03/07/2019, Neulasta ? Cycle 3 FOLFOX 03/29/2019, Neulasta (oxaliplatin dose reduced to 40 mg/m due to thrombocytopenia following cycle 2) ? Cycle 4 FOLFOX 04/12/2019 ? Cycle 5 FOLFOX 04/26/2019 ? Cycle 6 FOLFOX 05/11/2019 ? CT chest 05/20/2019-enlargement of mass at the upper esophagus, changes of cirrhosis and emphysema ? Cycle 1 nivolumab 05/25/2019 ? Cycle 2 nivolumab 06/09/2019 ? Cycle 3 nivolumab  06/24/2019  ? Cycle 4 nivolumab 07/07/2019 2. Solid dysphagia and odynophagia secondary to #1 3. Coronary artery disease 4. History of prostate cancer 5. COPD 6. Permanent cardiac pacemaker 7. Peripheral vascular disease 8. Chronic thrombocytopenia 9. 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 CT chest 12/28/2017-no evidence of pulmonary emboli. Esophageal thickening consistent with patient's history of esophageal carcinoma. 11. Probable cirrhosis, referred to GI 12. Anemia-progressive compared to when he was here in June, potentially related to GI bleeding or epistaxis. Red cell transfusions 03/21/2019 and 04/28/2019 13. 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 Status post evaluation by [name], not a candidate for vocal fold injection at present. 14. Port-A-Cath placed 02/02/2011 15. Neutropenia secondary to chemotherapy 16. 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 Admission 07/19/2019 with hemoptysis  [name] has local recurrence of esophagus cancer.  He is symptomatic with dysphagia and hemoptysis.  A CT yesterday confirms enlargement of the upper esophageal mass with evidence of trachea invasion.  I suspect the hemoptysis is related to tumor invading the trachea.  Systemic treatment options are limited now that he has progressed following FOLFOX and nivolumab.  I recommend palliative radiation.  I will contact radiation oncology to request urgent radiation.  I doubt the hemoptysis will be amenable to endoscopic therapy or embolization.  I began a discussion regarding CPR and ACLS with [name].  I will continue a CODE STATUS and hospice discussion with [name] tomorrow.  Recommendations: 1.  Transfuse packed red blood cells for his symptomatic anemia 2.   Radiation oncology consult  3.   Continue discussions regarding CODE STATUS 4.   Discontinue plan  for further nivolumab    LOS: 0 days   Betsy Coder, MD   07/20/2019, 7:39 AM

## 2019-07-20 NOTE — Progress Notes (Signed)
I contacted Dr. Tana Coast, I discussed Dr. Tarri Glenn reviewed the patient's chest CT and history of esophageal cancer with progressive enlargement of esophageal mass invading the trachea. No recommendations for endoscopic evaluation at this time.  Await further recommendations from radiation oncology. Please call our service back if radiation oncology recommends official GI consult.  Nomie Buchberger Kennedy-Smith CRNP [facility].

## 2019-07-20 NOTE — Progress Notes (Signed)
[facility]                                                                              Patient Demographics  [name], is a 85 y.o. male, DOB - 08-24-1934, TD:[id number]  Admit date - 07/19/2019   Admitting Physician Emeterio Reeve, DO  Outpatient Primary MD for the patient is [name], MD  Outpatient specialists:   LOS - 0  days   Medical records reviewed and are as summarized below:    Chief Complaint  Patient presents with  . Hemoptysis       Brief summary   Patient is 85 year old male with COPD, CAD, GERD, paroxysmal A. fib, pacemaker, PVD, history of prostate CA, esophageal CA presented with worsening hemoptysis and hematemesis.  No fevers or chills.  Has hard time eating, choking sensation and increasing dysphagia. Feels dizzy with ambulation. Per ED MD, oncology was consulted and recommended XRT  Assessment & Plan    Esophageal malignancy with hemoptysis and hematemesis -Patient has history of locally recurrent esophageal cancer, currently on nivolumab, following Dr. Benay Spice now symptomatic with dysphagia and hemoptysis -CT showed enlargement of the upper esophageal mass with evidence of tracheal invasion. -Per oncology systemic treatment options are limited now as he has progressed following the FOLFOX and pneumobilia Mab, recommending palliative radiation, doubt hemoptysis will be unable to endoscopic therapy or embolization. -Per GI, no role of endoscopy at this time and recommended oncology and XRT -Radiation oncology consulted by Dr. Learta Codding.  Palliative medicine also consulted for goals of care -Hemoglobin down to 7.7, transfuse 1 unit packed RBCs  Active Problems: CAD status post CABG, history of paroxysmal A. fib, history of aortic stenosis, pacemaker, AAA -Currently normal sinus rhythm -BP stable, no cardiac symptoms -Continue beta-blocker, losartan, statin  OSA -CPAP if able to tolerate  GERD Continue  PPI   Code Status: Currently full CODE STATUS DVT Prophylaxis:  SCD's Family Communication: Discussed all imaging results, lab results, explained to the patient  Disposition Plan:     Status is: Observation  The patient remains OBS appropriate and will d/c before 2 midnights.  Dispo: The patient is from: Home              Anticipated d/c is to: Home              Anticipated d/c date is: 1 day              Patient currently is not medically stable to d/c.  Plan for goals of care, needs 1 unit packed RBCs, still had episode of hemoptysis this morning      Time Spent in minutes   35 minutes  Procedures:  None  Consultants:   GI Oncology Radiation oncology  Antimicrobials:   Anti-infectives (From admission, onward)   None          Medications  Scheduled Meds: . sodium chloride   Intravenous Once  . acetaminophen  650 mg Oral BID  . atorvastatin  40 mg Oral q1800  . Chlorhexidine Gluconate Cloth  6 each Topical Daily  . losartan  25 mg Oral QHS  .  metoprolol tartrate  25 mg Oral BID  . pantoprazole (PROTONIX) IV  40 mg Intravenous Q12H   Continuous Infusions: PRN Meds:.HYDROcodone-acetaminophen, prochlorperazine, Resource ThickenUp Clear, senna      Subjective:   [name] was seen and examined today.  Hoarseness of voice, states had an episode of hemoptysis this morning, difficulty eating due to dysphagia.  Patient denies dizziness, abdominal pain, new weakness, numbess, tingling.   Objective:   Vitals:   07/19/19 1739 07/19/19 2110 07/20/19 0117 07/20/19 0521  BP:  112/61 120/70 122/60  Pulse:  93 75 77  Resp:  18 20 18   Temp:  99.5 F (37.5 C) 98.6 F (37 C) 98.7 F (37.1 C)  TempSrc:  Oral Oral Oral  SpO2:  92% 98% 94%  Weight: 77 kg     Height: 5\' 7"  (1.702 m)       Intake/Output Summary (Last 24 hours) at 07/20/2019 1306 Last data filed at 07/20/2019 [id number] Gross per 24 hour  Intake 980 ml  Output --  Net 980 ml     Wt Readings  from Last 3 Encounters:  07/19/19 77 kg  07/07/19 77.9 kg  06/24/19 78.1 kg     Exam  General: Alert and oriented x 3, NAD  Cardiovascular: S1 S2 auscultated, RRR  Respiratory: Clear to auscultation bilaterally, no wheezing, rales or rhonchi  Gastrointestinal: Soft, nontender, nondistended, + bowel sounds  Ext: no pedal edema bilaterally  Neuro: No new deficits  Musculoskeletal: No digital cyanosis, clubbing  Skin: No rashes  Psych: Normal affect and demeanor, alert and oriented x3    Data Reviewed:  I have personally reviewed following labs and imaging studies  Micro Results Recent Results (from the past 240 hour(s))  Respiratory Panel by RT PCR (Flu A&B, Covid) - Nasopharyngeal Swab     Status: None   Collection Time: 07/19/19 10:54 AM   Specimen: Nasopharyngeal Swab  Result Value Ref Range Status   SARS Coronavirus 2 by RT PCR NEGATIVE NEGATIVE Final    Comment: (NOTE) SARS-CoV-2 target nucleic acids are NOT DETECTED. The SARS-CoV-2 RNA is generally detectable in upper respiratoy specimens during the acute phase of infection. The lowest concentration of SARS-CoV-2 viral copies this assay can detect is 131 copies/mL. A negative result does not preclude SARS-Cov-2 infection and should not be used as the sole basis for treatment or other patient management decisions. A negative result may occur with  improper specimen collection/handling, submission of specimen other than nasopharyngeal swab, presence of viral mutation(s) within the areas targeted by this assay, and inadequate number of viral copies (<131 copies/mL). A negative result must be combined with clinical observations, patient history, and epidemiological information. The expected result is Negative. Fact Sheet for Patients:  PinkCheek.be Fact Sheet for Healthcare Providers:  GravelBags.it This test is not yet ap proved or cleared by the [place] FDA and  has been authorized for detection and/or diagnosis of SARS-CoV-2 by FDA under an Emergency Use Authorization (EUA). This EUA will remain  in effect (meaning this test can be used) for the duration of the COVID-19 declaration under Section 564(b)(1) of the Act, 21 U.S.C. section 360bbb-3(b)(1), unless the authorization is terminated or revoked sooner.    Influenza A by PCR NEGATIVE NEGATIVE Final   Influenza B by PCR NEGATIVE NEGATIVE Final    Comment: (NOTE) The Xpert Xpress SARS-CoV-2/FLU/RSV assay is intended as an aid in  the diagnosis of influenza from Nasopharyngeal swab specimens and  should not be used as  a sole basis for treatment. Nasal washings and  aspirates are unacceptable for Xpert Xpress SARS-CoV-2/FLU/RSV  testing. Fact Sheet for Patients: PinkCheek.be Fact Sheet for Healthcare Providers: GravelBags.it This test is not yet approved or cleared by the [place] FDA and  has been authorized for detection and/or diagnosis of SARS-CoV-2 by  FDA under an Emergency Use Authorization (EUA). This EUA will remain  in effect (meaning this test can be used) for the duration of the  Covid-19 declaration under Section 564(b)(1) of the Act, 21  U.S.C. section 360bbb-3(b)(1), unless the authorization is  terminated or revoked. Performed at [facility], [place] [address], [place],  [address]     Radiology Reports CT Chest W Contrast  Result Date: 07/19/2019 CLINICAL DATA:  Worsening hemoptysis over the past 2 weeks. History of esophageal cancer. EXAM: CT CHEST WITH CONTRAST TECHNIQUE: Multidetector CT imaging of the chest was performed during intravenous contrast administration. CONTRAST:  49mL OMNIPAQUE IOHEXOL 300 MG/ML  SOLN COMPARISON:  CT chest dated May 20, 2019. FINDINGS: Cardiovascular: Normal heart size status post CABG and AVR. No thoracic aortic aneurysm or dissection.  Coronary, aortic arch, and branch vessel atherosclerotic vascular disease. No central pulmonary embolism. Unchanged right chest wall pacemaker and left chest wall port catheter. Mediastinum/Nodes: Progressive enlargement of the ill-defined mass associated with the esophagus measuring 4.5 x 3.1 x 5.9 cm, previously 3.3 x 2.6 x 4.1 cm. The mass now invades the posterior wall of the trachea (series 4, image 87; series 2, images 27-35). There is some new fluid signal and gas within the mass that may reflect necrosis. No enlarged mediastinal, hilar, or axillary lymph nodes. The thyroid gland is unremarkable. Lungs/Pleura: Centrilobular paraseptal emphysema again noted. Unchanged juxtapleural scarring in the posterior right upper lobe. Chronic mild central peribronchial thickening. No focal consolidation, pleural effusion, or pneumothorax. Previously seen 10 mm nodular density in the left upper lobe has decreased in size, now measuring 6 mm (series 7, image 74). Unchanged 5 mm ground-glass nodule in the left upper lobe (series 7, image 63), stable since March 2017. Small calcified granuloma in the left upper lobe again noted. No new pulmonary nodule. Upper Abdomen: No acute abnormality. Unchanged cirrhosis. Unchanged bilateral nonobstructive renal calculi. Musculoskeletal: No chest wall abnormality. No acute or significant osseous findings. IMPRESSION: 1. Progressive enlargement of the necrotic mass associated with the upper esophagus, now measuring 4.5 x 3.1 x 5.9 cm, previously 3.3 x 2.6 x 4.1 cm, with new invasion of the posterior wall of the trachea. 2. Previously seen 10 mm nodular density in the left upper lobe has decreased in size, now measuring 6 mm, consistent with resolving infection or inflammation. 3. Unchanged cirrhosis. 4. Unchanged bilateral nonobstructive nephrolithiasis. 5. Aortic Atherosclerosis (ICD10-I70.0) and Emphysema (ICD10-J43.9). Electronically Signed   By: Titus Dubin M.D.   On: 07/19/2019  13:42   DG Chest Port 1 View  Result Date: 07/19/2019 CLINICAL DATA:  Cough, history of throat cancer EXAM: PORTABLE CHEST 1 VIEW COMPARISON:  12/15/2018 FINDINGS: Left chest wall port catheter tip overlies SVC. No new consolidation or edema. No pleural effusion or pneumothorax. Stable cardiomediastinal contours with normal heart size. Valve replacement and right chest wall dual lead pacemaker noted. IMPRESSION: No acute process in the chest. Electronically Signed   By: Macy Mis M.D.   On: 07/19/2019 12:11    Lab Data:  CBC: Recent Labs  Lab 07/19/19 1048 07/19/19 1815 07/20/19 0611  WBC 3.6* 2.9* 3.0*  HGB 8.4* 8.0* 7.7*  HCT 27.0* 25.5* 25.0*  MCV 93.4 93.4 93.3  PLT 117* 100* 93*   Basic Metabolic Panel: Recent Labs  Lab 07/19/19 1048 07/20/19 0611  NA 138 139  K 3.7 3.8  CL 107 107  CO2 22 24  GLUCOSE 105* 88  BUN 18 15  CREATININE 0.96 0.95  CALCIUM 8.7* 8.4*   GFR: Estimated Creatinine Clearance: 55.1 mL/min (by C-G formula based on SCr of 0.95 mg/dL). Liver Function Tests: Recent Labs  Lab 07/19/19 1048  AST 27  ALT 15  ALKPHOS 192*  BILITOT 1.1  PROT 7.1  ALBUMIN 2.9*   No results for input(s): LIPASE, AMYLASE in the last 168 hours. No results for input(s): AMMONIA in the last 168 hours. Coagulation Profile: Recent Labs  Lab 07/19/19 1053  INR 1.2   Cardiac Enzymes: No results for input(s): CKTOTAL, CKMB, CKMBINDEX, TROPONINI in the last 168 hours. BNP (last 3 results) No results for input(s): PROBNP in the last 8760 hours. HbA1C: No results for input(s): HGBA1C in the last 72 hours. CBG: No results for input(s): GLUCAP in the last 168 hours. Lipid Profile: No results for input(s): CHOL, HDL, LDLCALC, TRIG, CHOLHDL, LDLDIRECT in the last 72 hours. Thyroid Function Tests: No results for input(s): TSH, T4TOTAL, FREET4, T3FREE, THYROIDAB in the last 72 hours. Anemia Panel: No results for input(s): VITAMINB12, FOLATE, FERRITIN, TIBC, IRON,  RETICCTPCT in the last 72 hours. Urine analysis:    Component Value Date/Time   COLORURINE YELLOW 06/08/2015 1056   APPEARANCEUR CLEAR 06/08/2015 1056   LABSPEC 1.013 06/08/2015 1056   PHURINE 7.5 06/08/2015 1056   GLUCOSEU NEGATIVE 06/08/2015 1056   HGBUR TRACE (A) 06/08/2015 1056   BILIRUBINUR NEGATIVE 06/08/2015 1056   KETONESUR NEGATIVE 06/08/2015 1056   PROTEINUR NEGATIVE 06/08/2015 1056   UROBILINOGEN 0.2 09/26/2009 1906   NITRITE NEGATIVE 06/08/2015 [place] 06/08/2015 1056     Daphanie Oquendo M.D. [facility] 07/20/2019, 1:06 PM   Call night coverage person covering after 7pm

## 2019-07-20 NOTE — Progress Notes (Addendum)
Pt declined use of cpap again tonight.  Pt stated he prefers just to wear oxygen in his nose.  Pt stated he feels "strangled" when he wears cpap.  Pt stated he does not want to wear cpap while here in the hospital.  Pt was advised that RT is available all night should he change his mind.

## 2019-07-20 NOTE — Consult Note (Signed)
Consultation Note Date: 07/20/2019   Patient Name: [name].  DOB: March 28, 1934  MRN: [id number]  Age / Sex: 85 y.o., male  PCP: [name], MD Referring Physician: Mendel Corning, MD  Reason for Consultation: Establishing goals of care  HPI/Patient Profile: 85 y.o. male  with past medical history of esophageal cancer, CAD s/p CABG, aortic stenosis s/p replacement, SSS w/ pacemaker, AAA, and COPD admitted on 07/19/2019 with hemoptysis for 2 weeks. . CT revealed enlargement of esophagus mass with invasion of the posterior wall of the trachea. Systemic treatment options are limited and radiation has been recommended. PMT consulted for [place].   Clinical Assessment and Goals of Care: I have reviewed medical records including EPIC notes, labs and imaging, received report from RN, assessed the patient and then met with patient and 2 daughters  to discuss diagnosis prognosis, GOC, EOL wishes, disposition and options.  I also spoke with Shona Simpson, PA with radiation oncology regarding plan of care. At this time, plan is for 5 week course of radiation.  Conversation with patient quite limited d/t hard of hearing - daughters plan to bring hearing aids tonight and ask if we can discuss goals of care more then. I had further discussion with his daughters.   I introduced Palliative Medicine as specialized medical care for people living with serious illness. It focuses on providing relief from the symptoms and stress of a serious illness. The goal is to improve quality of life for both the patient and the family.  Patient is able to tell me his only complaint is pain when trying to eat - daughters confirm he seems to be comfortable but is not eating much. RN at bedside tells me he has only eating applesauce today - unable to eat any lunch. Patient tells me he does better with liquids - will try some ensure.   Daughters tell me patient does not  like taking his pain medications that have been ordered. Per chart review, he took one dose of Hycet last night.   Daughters would like to continue goals of care discussion to include patient and his DIL tomorrow at 19. Will plan to further discuss pain management options and goals of care during that time.   Primary Decision Maker PATIENT    SUMMARY OF RECOMMENDATIONS   - GOC discussion to include 2 daughters, patient, and DIL tomorrow at 71 - will address code status then - ensure added for nutrition support - SLP eval ordered - consider premedicating with pain medication prior to meals? - will discuss other pain management options with patient and family tomorrow - will discuss outpatient palliative options  Code Status/Advance Care Planning:  Full code  Prognosis:   Unable to determine  Discharge Planning: To Be Determined      Primary Diagnoses: Present on Admission: . Cough with hemoptysis . CAD, multiple vessel . SSS (sick sinus syndrome) ([place]) . AAA (abdominal aortic aneurysm) without rupture ([place]) . Paroxysmal atrial fibrillation (HCC) . Essential hypertension . 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 The following aspects are pertinent.  Past Medical History:  Diagnosis Date  . AAA (abdominal aortic aneurysm) ([place])    a. Last duplex - 3.8cm 01/2013 - due 01/2014.  [name] Acne rosacea   . Alcoholic hepatitis with ascites 08/28/2017  . Anginal pain ([place]) 1996  . Aortic stenosis, mild    Last echo  05/04/12 +LVH  . Arthritis    "shoulders" (09/01/2017)  . Back pain    hx epidural injections  . Baker's cyst    Lt.  [name] CAD (coronary artery disease)    a. s/p MI/PTCA - Cx 1994. b. s/p CABG x5 in 1997. c. Abnl nuc 2003- occ SVG-RCA, patent seq SVG-OM1-dCxOM, patentl LIMA-LAD, patent, SVG-diag. d. Normal nuc 09/2012.  . Carotid artery disease ([place])    a.  Duplex 01/2013: mildly abnormal. Mild plaque without significant diameter reduction. Repeat recommended 11/15.  [name] COPD (chronic obstructive pulmonary disease) ([place])   . Dizziness and giddiness 08/04/2013  . GERD (gastroesophageal reflux disease)   . H/O: GI bleed    mild, neg. colonoscopy  . Heart murmur   . HOH (hard of hearing)   . HTN (hypertension)   . Hyperlipidemia   . Hypertensive heart disease   . Myocardial infarction ([place]) 1994  . OSA on CPAP   . OSA treated with BiPAP 11/20/2015   Severe with [facility] 38/hr  . PAF (paroxysmal atrial fibrillation) ([place])    a. Remote per records.   . Pneumonia    history  . Presence of permanent cardiac pacemaker    DOI 1999 with change out 2006; St Jude  . Prostate cancer ([place]) ~ 2010   a. s/p radical retropubic prostatectomy with bilateral pelvic lymph node dissection  . PVD (peripheral vascular disease) ([place])    a. Rt ext iliac stenosis, last PV cath 2003, moderate stenosis last Lower Ext Dopplers 12/16/11 - Rt. ABI 0.96  Lt ABI 1.0.  . Sick sinus syndrome ([place])    a. placed 1999, gen change 2011 - St. Jude.  . Sinus node dysfunction ([place]) 01/20/2013  . Syncope 11/28/97  . Thrombocytopenia (HCC)    chronic, mild  . Valvular heart disease    a. Echo 04/2012: mild-mod AS, mild AI, mild MR.   Social History   Socioeconomic History  . Marital status: Widowed    Spouse name: Not on file  . Number of children: 5  . Years of education: 7th  . Highest education level: Not on file  Occupational History  . Occupation: Retired  Tobacco Use  . Smoking status: Former Smoker    Packs/day: 2.00    Years: 48.00    Pack years: 96.00    Types: Cigarettes    Quit date: 03/18/1995    Years since quitting: 24.3  . Smokeless tobacco: Never Used  Substance and Sexual Activity  . Alcohol use: No    Comment: 09/01/2017 "nothing in the last couple years"  . Drug use: Never  . Sexual activity: Not Currently  Other Topics Concern  . Not on file  Social  History Narrative  . Not on file   Social Determinants of Health   Financial Resource Strain:   . Difficulty of Paying Living Expenses:   Food Insecurity:   . Worried About Charity fundraiser in the Last Year:   . Arboriculturist in the Last Year:   Transportation Needs:   . Film/video editor (Medical):   [name] Lack of Transportation (Non-Medical):   Physical Activity:   . Days of Exercise per Week:   . Minutes of Exercise per Session:   Stress:   . Feeling of Stress :   Social Connections:   . Frequency of Communication with Friends and Family:   . Frequency of Social Gatherings with Friends and Family:   . Attends Religious Services:   .  Active Member of Clubs or Organizations:   . Attends Archivist Meetings:   [name] Marital Status:    Family History  Problem Relation Age of Onset  . Colon cancer Father   . Cancer Brother   . Heart Problems Brother   . Heart Problems Sister   . CAD Other   . Stomach cancer Neg Hx   . Pancreatic cancer Neg Hx   . Esophageal cancer Neg Hx    Scheduled Meds: . acetaminophen  650 mg Oral BID  . atorvastatin  40 mg Oral q1800  . Chlorhexidine Gluconate Cloth  6 each Topical Daily  . [START ON 07/21/2019] feeding supplement (ENSURE ENLIVE)  237 mL Oral BID BM  . losartan  25 mg Oral QHS  . metoprolol tartrate  25 mg Oral BID  . pantoprazole (PROTONIX) IV  40 mg Intravenous Q12H   Continuous Infusions: PRN Meds:.HYDROcodone-acetaminophen, prochlorperazine, Resource ThickenUp Clear, senna No Known Allergies Review of Systems  HENT:       Pain with swallowing    Physical Exam Constitutional:      General: He is not in acute distress. Pulmonary:     Effort: Pulmonary effort is normal. No respiratory distress.  Skin:    General: Skin is warm and dry.  Neurological:     Mental Status: He is alert and oriented to person, place, and time.  Psychiatric:        Mood and Affect: Mood normal.        Behavior: Behavior normal.       Vital Signs: BP (!) 114/58   Pulse 80   Temp 99.5 F (37.5 C) (Oral)   Resp 19   Ht '5\' 7"'  (1.702 m)   Wt 77 kg   SpO2 94%   BMI 26.59 kg/m  Pain Scale: 0-10   Pain Score: 5    SpO2: SpO2: 94 % O2 Device:SpO2: 94 % O2 Flow Rate: .   IO: Intake/output summary:   Intake/Output Summary (Last 24 hours) at 07/20/2019 2005 Last data filed at 07/20/2019 1915 Gross per 24 hour  Intake 947.17 ml  Output 1000 ml  Net -52.83 ml    LBM:   Baseline Weight: Weight: 77 kg Most recent weight: Weight: 77 kg     Palliative Assessment/Data: PPS 40%    Time Total: 70 minutes Greater than 50%  of this time was spent counseling and coordinating care related to the above assessment and plan.  Juel Burrow, DNP, AGNP-C Palliative Medicine Team [phone] Pager: [phone]

## 2019-07-21 ENCOUNTER — Inpatient Hospital Stay

## 2019-07-21 ENCOUNTER — Inpatient Hospital Stay: Admitting: Nurse Practitioner

## 2019-07-21 ENCOUNTER — Inpatient Hospital Stay (HOSPITAL_COMMUNITY): Payer: Medicare Other

## 2019-07-21 ENCOUNTER — Ambulatory Visit
Admit: 2019-07-21 | Discharge: 2019-07-21 | Disposition: A | Discharge status: Home / Self Care | Source: Ambulatory Visit | Attending: Radiation Oncology | Admitting: Radiation Oncology

## 2019-07-21 DIAGNOSIS — C154 Malignant neoplasm of middle third of esophagus: Secondary | ICD-10-CM | POA: Insufficient documentation

## 2019-07-21 DIAGNOSIS — Z510 Encounter for antineoplastic radiation therapy: Secondary | ICD-10-CM | POA: Insufficient documentation

## 2019-07-21 MED ORDER — SODIUM CHLORIDE 0.9 % IV SOLN: INTRAVENOUS | Status: DC

## 2019-07-21 NOTE — Progress Notes (Addendum)
Modified Barium Swallow Progress Note  Patient Details  Name: [name]. MRN: [id number] Date of Birth: Dec 27, 1934  Today's Date: 07/21/2019  Modified Barium Swallow completed.  Full report located under Chart Review in the Imaging Section.  Brief recommendations include the following:  Clinical Impression   Pt demonstrates quite remarkable oropharyngeal swallow function for his advanced age and medical history. Oral phase was grossly WFL. Regular solids were not assessed, as his dentures were not available at the time. With soft solids, pt had good oral clearance and lingual control. Pharyngeally, pt demonstrates reduced laryngeal elevation, hyoid excursion, and epiglottic inversion, which ultimately resulted in trace penetration of thin liquids prior to the swallow and mild residue in valleculae with purees/solids after the swallow. Residue was improved with liquid wash/repeat swallow. Given sequential straw or cup sips, pt had stagnant trace penetration, so he is recommended to take single sips. Pt demonstrated strong cough response t/o study, which was not correlative with penetration/aspiration. Esophageal phase showed very mild retrograde bolus movement with thin liquids. At this time, pt is recommended for thin liquids and mechanical soft solids (regular once dentures are available), alternating liquids/solids, single sips. Of note, pt has plans to initiate upper esophageal XRT, which is very likely to impact swallow function. He should follow up with OP SLP regarding PO intake r/t this treatment.  No further ST indicated at the acute level.   Swallow Evaluation Recommendations   SLP Diet Recommendations: Thin liquid;Dysphagia 3 (Mech soft) solids   Liquid Administration via: Straw;Cup   Medication Administration: Crushed with puree   Supervision: Patient able to self feed   Compensations: Small sips/bites;Follow solids with liquid       Oral Care Recommendations: Oral care  BID      Corrinna Karapetyan P. Keir Foland, M.S., CCC-SLP Speech-Language Pathologist Acute Rehabilitation Services Pager: [place] 07/21/2019,2:18 PM

## 2019-07-21 NOTE — Progress Notes (Addendum)
Palliative:  Had goals of care meeting planned with patient and family today for 28. Spoke with SLP - plan for MBS today around that time. Discussed with family - they would like to hold off on meeting until patient can be present for the whole meeting and suspect MBS results may add to goals of care conversation. Meeting rescheduled for tomorrow 5/7 2 pm.  Will plan to see patient tomorrow at 2. Please call if assistance is needed in the interim.   Thank you for this consult.  Juel Burrow, DNP, AGNP-C Palliative Medicine Team Team Phone # [phone]  Pager # [phone]  NO CHARGE

## 2019-07-21 NOTE — Progress Notes (Signed)
I spoke with the patient this morning when he came to our department for simulation. He is feeling better since he was admitted after receiving blood products. He is still having a small amount of blood when he coughs and is looking forward to having some relief. 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 It is higher up and eroding into the trachea, so Dr. Lisbeth Renshaw recommends 5 weeks of definitive treatment but so that he notes improvement in his bleeding, Dr. Lisbeth Renshaw will anticipate 3 Gy on Monday when we start treatment to help improve this more quickly. The patient has signed written consent to proceed. He is aware of the role though for palliative care involvement and met with Kathie Rhodes, NP yesterday. He will begin radiation this coming Monday 07/25/19.

## 2019-07-21 NOTE — Progress Notes (Signed)
Initial Nutrition Assessment  DOCUMENTATION CODES:   Not applicable  INTERVENTION:  Continue Ensure Enlive po BID, each supplement provides 350 kcal and 20 grams of protein Magic cup TID with meals, each supplement provides 290 kcal and 9 grams of protein   NUTRITION DIAGNOSIS:   Inadequate oral intake related to cancer and cancer related treatments(recurrent esophageal cancer with progressive dysphagia) as evidenced by per patient/family report(difficutlities with eating due to choking sensation).  GOAL:   Patient will meet greater than or equal to 90% of their needs    MONITOR:   PO intake, Supplement acceptance, Labs, Weight trends, I & O's  REASON FOR ASSESSMENT:   Malnutrition Screening Tool    ASSESSMENT:   85 year old male with past medical history of COPD, CAD, GERD, paroxysmal atrial fibrillation s/p pacemaker, PVD, history of prostate cancer, recurrent esophageal cancer currently on nivolumab presented with worsening hemoptysis and hematemesis, reports having a hard time eating, choking sensation, and increasing dysphagia.  Patient admitted with hemoptysis and radiation therapy.  Patient is on a soft diet with thin liquids. Per flowsheets, he consumed 75% of dinner on 5/4, 50% of breakfast, 25% of lunch, and 100% of dinner on 5/5. Patient has been ordered Ensure BID per medication review. Patient awake, alert this afternoon, and just returned from [facility]. He reports that he was unable to swallow "that nasty stuff" they wanted me to, reports it made him cough and stated he had not stopped coughing since returning to his room. Patient endorses progressive dysphagia over the past 2 months, reports tolerating mostly water and Ensure at home. Patient amenable to continue drinking Ensure as well as Magic Cup with meals.   Current wt 169.4 lbs Per history, on 04/12/19 pt weighed 183.92 lbs, on 05/11/19 he weighed 181.72 lbs, on 05/23/19 pt weighed 178.64 lbs, on 06/24/19 he weighed  171.82 lbs, on 07/07/19 pt weighed 171.38 lbs. This indicates a 14.52 lb (7.9%) wt loss over the past 3.5 months which is significant.  Per notes: -MBS completed today -XRT to begin on 07/25/19 -PCT meeting with family rescheduled for tomorrow at 2 pm  Medications reviewed and include: Protonix Labs reviewed   NUTRITION - FOCUSED PHYSICAL EXAM:   Most Recent Value  Orbital Region  Moderate depletion  Upper Arm Region  Mild depletion  Thoracic and Lumbar Region  Unable to assess  Buccal Region  Moderate depletion  Temple Region  Mild depletion  Clavicle Bone Region  Mild depletion  Clavicle and Acromion Bone Region  Mild depletion  Scapular Bone Region  Unable to assess  Dorsal Hand  Mild depletion  Patellar Region  Unable to assess  Anterior Thigh Region  Unable to assess  Posterior Calf Region  Unable to assess  Edema (RD Assessment)  None  Hair  Reviewed  Eyes  Reviewed  Mouth  Reviewed [wears dentures]  Skin  Reviewed  Nails  Reviewed       Diet Order:   Diet Order            DIET SOFT Room service appropriate? Yes with Assist; Fluid consistency: Thin  Diet effective now              EDUCATION NEEDS:   Education needs have been addressed  Skin:  Skin Assessment: Reviewed RN Assessment  Last BM:  5/5  Height:   Ht Readings from Last 1 Encounters:  07/19/19 5\' 7"  (1.702 m)    Weight:   Wt Readings from Last 1 Encounters:  07/19/19 77 kg     BMI:  Body mass index is 26.59 kg/m.  Estimated Nutritional Needs:   Kcal:  [id number]  Protein:  100-115  Fluid:  >/= 2 L/day   Lajuan Lines, RD, LDN Clinical Nutrition After Hours/Weekend Pager # in [place]

## 2019-07-21 NOTE — Progress Notes (Addendum)
[facility]                                                                              Patient Demographics  [name], is a 85 y.o. male, DOB - Sep 25, 1934, [id number]  Admit date - 07/19/2019   Admitting Physician Emeterio Reeve, DO  Outpatient Primary MD for the patient is [name], MD  Outpatient specialists:   LOS - 1  days   Medical records reviewed and are as summarized below:    Chief Complaint  Patient presents with  . Hemoptysis       Brief summary   Patient is 85 year old male with COPD, CAD, GERD, paroxysmal A. fib, pacemaker, PVD, history of prostate CA, esophageal CA presented with worsening hemoptysis and hematemesis.  No fevers or chills.  Has hard time eating, choking sensation and increasing dysphagia. Feels dizzy with ambulation. Per ED MD, oncology was consulted and recommended XRT  Assessment & Plan    Esophageal malignancy with hemoptysis and hematemesis Acute on chronic anemia, possibly chemotherapy-induced pancytopenia -Patient has history of locally recurrent esophageal cancer, currently on nivolumab, following Dr. Benay Spice now symptomatic with dysphagia and hemoptysis -CT showed enlargement of the upper esophageal mass with evidence of tracheal invasion. -Per oncology systemic treatment options are limited now as he has progressed following the FOLFOX and nivolumab, recommending palliative radiation, doubt hemoptysis will be unable to endoscopic therapy or embolization. -Per GI, no role of endoscopy at this time and recommended oncology and XRT -Status post 1 unit packed RBCs on 5/5, H&H stable -Radiation oncology consulted, starting CT simulation today, recommending 5 weeks of definitive treatment starting Monday -Palliative medicine following, appreciate assistance. -Patient has outpatient follow-up with ENT at [facility] next week   Active Problems: CAD status post CABG, history of paroxysmal A. fib,  history of aortic stenosis, pacemaker, AAA -Currently normal sinus rhythm -BP currently stable, continue losartan, beta-blocker -Continue statin  OSA -CPAP if able to tolerate  GERD Continue PPI   Code Status: Currently full CODE STATUS DVT Prophylaxis:  SCD's Family Communication: Discussed all imaging results, lab results, explained to the patient  Disposition Plan:     Status is: Inpatient  The patient will require care spanning > 2 midnights and should be moved to inpatient because: Ongoing diagnostic testing needed not appropriate for outpatient work up.    Dispo: The patient is from: Home              Anticipated d/c is to: Home              Anticipated d/c date is: 2 days              Patient currently is not medically stable to d/c.  Plan for goals of care, undergoing CT simulation    Time Spent in minutes   35 minutes  Procedures:  None  Consultants:   GI Oncology Radiation oncology  Antimicrobials:   Anti-infectives (From admission, onward)   None         Medications  Scheduled Meds: . acetaminophen  650 mg Oral BID  . atorvastatin  40 mg Oral q1800  .  Chlorhexidine Gluconate Cloth  6 each Topical Daily  . feeding supplement (ENSURE ENLIVE)  237 mL Oral BID BM  . losartan  25 mg Oral QHS  . metoprolol tartrate  25 mg Oral BID  . pantoprazole (PROTONIX) IV  40 mg Intravenous Q12H   Continuous Infusions: PRN Meds:.HYDROcodone-acetaminophen, prochlorperazine, Resource ThickenUp Clear, senna      Subjective:   [name] was seen and examined today.  Still has hoarseness of voice however no worsening of hemoptysis.  Feels better with blood transfusion yesterday.  No fevers or chills or acute shortness of breath.  Patient denies dizziness, abdominal pain, new weakness, numbess, tingling.   Objective:   Vitals:   07/20/19 1822 07/20/19 2023 07/21/19 0528 07/21/19 0940  BP: (!) 114/58 126/67 132/66   Pulse: 80 91 95   Resp: 19 20 20      Temp: 99.5 F (37.5 C) 98.6 F (37 C) 97.8 F (36.6 C)   TempSrc: Oral     SpO2: 94% 93% 93% 91%  Weight:      Height:        Intake/Output Summary (Last 24 hours) at 07/21/2019 1145 Last data filed at 07/20/2019 1915 Gross per 24 hour  Intake 827.17 ml  Output 1000 ml  Net -172.83 ml     Wt Readings from Last 3 Encounters:  07/19/19 77 kg  07/07/19 77.9 kg  06/24/19 78.1 kg   Physical Exam  General: Alert and oriented x 3, NAD, hoarseness of voice  Cardiovascular: S1 S2 clear, RRR. No pedal edema b/l  Respiratory: Mild scattered rhonchi  Gastrointestinal: Soft, nontender, nondistended, NBS  Ext: no pedal edema bilaterally  Neuro: no new deficits  Musculoskeletal: No cyanosis, clubbing  Skin: No rashes  Psych: Normal affect and demeanor, alert and oriented x3     Data Reviewed:  I have personally reviewed following labs and imaging studies  Micro Results Recent Results (from the past 240 hour(s))  Respiratory Panel by RT PCR (Flu A&B, Covid) - Nasopharyngeal Swab     Status: None   Collection Time: 07/19/19 10:54 AM   Specimen: Nasopharyngeal Swab  Result Value Ref Range Status   SARS Coronavirus 2 by RT PCR NEGATIVE NEGATIVE Final    Comment: (NOTE) SARS-CoV-2 target nucleic acids are NOT DETECTED. The SARS-CoV-2 RNA is generally detectable in upper respiratoy specimens during the acute phase of infection. The lowest concentration of SARS-CoV-2 viral copies this assay can detect is 131 copies/mL. A negative result does not preclude SARS-Cov-2 infection and should not be used as the sole basis for treatment or other patient management decisions. A negative result may occur with  improper specimen collection/handling, submission of specimen other than nasopharyngeal swab, presence of viral mutation(s) within the areas targeted by this assay, and inadequate number of viral copies (<131 copies/mL). A negative result must be combined with  clinical observations, patient history, and epidemiological information. The expected result is Negative. Fact Sheet for Patients:  PinkCheek.be Fact Sheet for Healthcare Providers:  GravelBags.it This test is not yet ap proved or cleared by the [place] FDA and  has been authorized for detection and/or diagnosis of SARS-CoV-2 by FDA under an Emergency Use Authorization (EUA). This EUA will remain  in effect (meaning this test can be used) for the duration of the COVID-19 declaration under Section 564(b)(1) of the Act, 21 U.S.C. section 360bbb-3(b)(1), unless the authorization is terminated or revoked sooner.    Influenza A by PCR NEGATIVE NEGATIVE Final   Influenza  B by PCR NEGATIVE NEGATIVE Final    Comment: (NOTE) The Xpert Xpress SARS-CoV-2/FLU/RSV assay is intended as an aid in  the diagnosis of influenza from Nasopharyngeal swab specimens and  should not be used as a sole basis for treatment. Nasal washings and  aspirates are unacceptable for Xpert Xpress SARS-CoV-2/FLU/RSV  testing. Fact Sheet for Patients: PinkCheek.be Fact Sheet for Healthcare Providers: GravelBags.it This test is not yet approved or cleared by the [place] FDA and  has been authorized for detection and/or diagnosis of SARS-CoV-2 by  FDA under an Emergency Use Authorization (EUA). This EUA will remain  in effect (meaning this test can be used) for the duration of the  Covid-19 declaration under Section 564(b)(1) of the Act, 21  U.S.C. section 360bbb-3(b)(1), unless the authorization is  terminated or revoked. Performed at [facility], [place] [address] [place], [place] [address]     Radiology Reports CT Chest W Contrast  Result Date: 07/19/2019 CLINICAL DATA:  Worsening hemoptysis over the past 2 weeks. History of esophageal cancer. EXAM: CT CHEST WITH CONTRAST  TECHNIQUE: Multidetector CT imaging of the chest was performed during intravenous contrast administration. CONTRAST:  74mL OMNIPAQUE IOHEXOL 300 MG/ML  SOLN COMPARISON:  CT chest dated May 20, 2019. FINDINGS: Cardiovascular: Normal heart size status post CABG and AVR. No thoracic aortic aneurysm or dissection. Coronary, aortic arch, and branch vessel atherosclerotic vascular disease. No central pulmonary embolism. Unchanged right chest wall pacemaker and left chest wall port catheter. Mediastinum/Nodes: Progressive enlargement of the ill-defined mass associated with the esophagus measuring 4.5 x 3.1 x 5.9 cm, previously 3.3 x 2.6 x 4.1 cm. The mass now invades the posterior wall of the trachea (series 4, image 87; series 2, images 27-35). There is some new fluid signal and gas within the mass that may reflect necrosis. No enlarged mediastinal, hilar, or axillary lymph nodes. The thyroid gland is unremarkable. Lungs/Pleura: Centrilobular paraseptal emphysema again noted. Unchanged juxtapleural scarring in the posterior right upper lobe. Chronic mild central peribronchial thickening. No focal consolidation, pleural effusion, or pneumothorax. Previously seen 10 mm nodular density in the left upper lobe has decreased in size, now measuring 6 mm (series 7, image 74). Unchanged 5 mm ground-glass nodule in the left upper lobe (series 7, image 63), stable since March 2017. Small calcified granuloma in the left upper lobe again noted. No new pulmonary nodule. Upper Abdomen: No acute abnormality. Unchanged cirrhosis. Unchanged bilateral nonobstructive renal calculi. Musculoskeletal: No chest wall abnormality. No acute or significant osseous findings. IMPRESSION: 1. Progressive enlargement of the necrotic mass associated with the upper esophagus, now measuring 4.5 x 3.1 x 5.9 cm, previously 3.3 x 2.6 x 4.1 cm, with new invasion of the posterior wall of the trachea. 2. Previously seen 10 mm nodular density in the left upper  lobe has decreased in size, now measuring 6 mm, consistent with resolving infection or inflammation. 3. Unchanged cirrhosis. 4. Unchanged bilateral nonobstructive nephrolithiasis. 5. Aortic Atherosclerosis (ICD10-I70.0) and Emphysema (ICD10-J43.9). Electronically Signed   By: Titus Dubin M.D.   On: 07/19/2019 13:42   DG Chest Port 1 View  Result Date: 07/19/2019 CLINICAL DATA:  Cough, history of throat cancer EXAM: PORTABLE CHEST 1 VIEW COMPARISON:  12/15/2018 FINDINGS: Left chest wall port catheter tip overlies SVC. No new consolidation or edema. No pleural effusion or pneumothorax. Stable cardiomediastinal contours with normal heart size. Valve replacement and right chest wall dual lead pacemaker noted. IMPRESSION: No acute process in the chest. Electronically Signed  By: Macy Mis M.D.   On: 07/19/2019 12:11    Lab Data:  CBC: Recent Labs  Lab 07/19/19 1048 07/19/19 1815 07/20/19 0611 07/20/19 2051  WBC 3.6* 2.9* 3.0*  --   HGB 8.4* 8.0* 7.7* 8.8*  HCT 27.0* 25.5* 25.0* 27.1*  MCV 93.4 93.4 93.3  --   PLT 117* 100* 93*  --    Basic Metabolic Panel: Recent Labs  Lab 07/19/19 1048 07/20/19 0611  NA 138 139  K 3.7 3.8  CL 107 107  CO2 22 24  GLUCOSE 105* 88  BUN 18 15  CREATININE 0.96 0.95  CALCIUM 8.7* 8.4*   GFR: Estimated Creatinine Clearance: 55.1 mL/min (by C-G formula based on SCr of 0.95 mg/dL). Liver Function Tests: Recent Labs  Lab 07/19/19 1048  AST 27  ALT 15  ALKPHOS 192*  BILITOT 1.1  PROT 7.1  ALBUMIN 2.9*   No results for input(s): LIPASE, AMYLASE in the last 168 hours. No results for input(s): AMMONIA in the last 168 hours. Coagulation Profile: Recent Labs  Lab 07/19/19 1053  INR 1.2   Cardiac Enzymes: No results for input(s): CKTOTAL, CKMB, CKMBINDEX, TROPONINI in the last 168 hours. BNP (last 3 results) No results for input(s): PROBNP in the last 8760 hours. HbA1C: No results for input(s): HGBA1C in the last 72 hours. CBG: No  results for input(s): GLUCAP in the last 168 hours. Lipid Profile: No results for input(s): CHOL, HDL, LDLCALC, TRIG, CHOLHDL, LDLDIRECT in the last 72 hours. Thyroid Function Tests: No results for input(s): TSH, T4TOTAL, FREET4, T3FREE, THYROIDAB in the last 72 hours. Anemia Panel: No results for input(s): VITAMINB12, FOLATE, FERRITIN, TIBC, IRON, RETICCTPCT in the last 72 hours. Urine analysis:    Component Value Date/Time   COLORURINE YELLOW 06/08/2015 1056   APPEARANCEUR CLEAR 06/08/2015 1056   LABSPEC 1.013 06/08/2015 1056   PHURINE 7.5 06/08/2015 1056   GLUCOSEU NEGATIVE 06/08/2015 1056   HGBUR TRACE (A) 06/08/2015 1056   BILIRUBINUR NEGATIVE 06/08/2015 1056   KETONESUR NEGATIVE 06/08/2015 1056   PROTEINUR NEGATIVE 06/08/2015 1056   UROBILINOGEN 0.2 09/26/2009 1906   NITRITE NEGATIVE 06/08/2015 [place] 06/08/2015 1056     Paradise Vensel M.D. [facility] 07/21/2019, 11:45 AM   Call night coverage person covering after 7pm

## 2019-07-21 NOTE — Progress Notes (Addendum)
IP PROGRESS NOTE  Subjective:   He continues to have ongoing hemoptysis and dysphagia.  He has a choking sensation.  Radiation oncology planning for CT simulation later this morning for radiation to his enlarging esophageal mass.  Objective: Vital signs in last 24 hours: Blood pressure 132/66, pulse 95, temperature 97.8 F (36.6 C), resp. rate 20, height '5\' 7"'  (1.702 m), weight 77 kg, SpO2 93 %.  Intake/Output from previous day: 05/05 0701 - 05/06 0700 In: 947.2 [P.O.:600; I.V.:32.2; Blood:315] Out: 1000 [Urine:1000]  Physical Exam:  HEENT: No thrush Lungs: Right greater than left expiratory wheeze/rhonchi Cardiac: Regular rate and rhythm Abdomen: Nontender, no hepatosplenomegaly Extremities: No leg edema   Portacath/PICC-without erythema  Lab Results: Recent Labs    07/19/19 1815 07/19/19 1815 07/20/19 0611 07/20/19 2051  WBC 2.9*  --  3.0*  --   HGB 8.0*   < > 7.7* 8.8*  HCT 25.5*   < > 25.0* 27.1*  PLT 100*  --  93*  --    < > = values in this interval not displayed.    BMET Recent Labs    07/19/19 1048 07/20/19 0611  NA 138 139  K 3.7 3.8  CL 107 107  CO2 22 24  GLUCOSE 105* 88  BUN 18 15  CREATININE 0.96 0.95  CALCIUM 8.7* 8.4*    Lab Results  Component Value Date   CEA1 2.08 02/07/2019    Studies/Results: CT Chest W Contrast  Result Date: 07/19/2019 CLINICAL DATA:  Worsening hemoptysis over the past 2 weeks. History of esophageal cancer. EXAM: CT CHEST WITH CONTRAST TECHNIQUE: Multidetector CT imaging of the chest was performed during intravenous contrast administration. CONTRAST:  49m OMNIPAQUE IOHEXOL 300 MG/ML  SOLN COMPARISON:  CT chest dated May 20, 2019. FINDINGS: Cardiovascular: Normal heart size status post CABG and AVR. No thoracic aortic aneurysm or dissection. Coronary, aortic arch, and branch vessel atherosclerotic vascular disease. No central pulmonary embolism. Unchanged right chest wall pacemaker and left chest wall port catheter.  Mediastinum/Nodes: Progressive enlargement of the ill-defined mass associated with the esophagus measuring 4.5 x 3.1 x 5.9 cm, previously 3.3 x 2.6 x 4.1 cm. The mass now invades the posterior wall of the trachea (series 4, image 87; series 2, images 27-35). There is some new fluid signal and gas within the mass that may reflect necrosis. No enlarged mediastinal, hilar, or axillary lymph nodes. The thyroid gland is unremarkable. Lungs/Pleura: Centrilobular paraseptal emphysema again noted. Unchanged juxtapleural scarring in the posterior right upper lobe. Chronic mild central peribronchial thickening. No focal consolidation, pleural effusion, or pneumothorax. Previously seen 10 mm nodular density in the left upper lobe has decreased in size, now measuring 6 mm (series 7, image 74). Unchanged 5 mm ground-glass nodule in the left upper lobe (series 7, image 63), stable since March 2017. Small calcified granuloma in the left upper lobe again noted. No new pulmonary nodule. Upper Abdomen: No acute abnormality. Unchanged cirrhosis. Unchanged bilateral nonobstructive renal calculi. Musculoskeletal: No chest wall abnormality. No acute or significant osseous findings. IMPRESSION: 1. Progressive enlargement of the necrotic mass associated with the upper esophagus, now measuring 4.5 x 3.1 x 5.9 cm, previously 3.3 x 2.6 x 4.1 cm, with new invasion of the posterior wall of the trachea. 2. Previously seen 10 mm nodular density in the left upper lobe has decreased in size, now measuring 6 mm, consistent with resolving infection or inflammation. 3. Unchanged cirrhosis. 4. Unchanged bilateral nonobstructive nephrolithiasis. 5. Aortic Atherosclerosis (ICD10-I70.0) and Emphysema (ICD10-J43.9).  Electronically Signed   By: Titus Dubin M.D.   On: 07/19/2019 13:42   DG Chest Port 1 View  Result Date: 07/19/2019 CLINICAL DATA:  Cough, history of throat cancer EXAM: PORTABLE CHEST 1 VIEW COMPARISON:  12/15/2018 FINDINGS: Left chest  wall port catheter tip overlies SVC. No new consolidation or edema. No pleural effusion or pneumothorax. Stable cardiomediastinal contours with normal heart size. Valve replacement and right chest wall dual lead pacemaker noted. IMPRESSION: No acute process in the chest. Electronically Signed   By: Macy Mis M.D.   On: 07/19/2019 12:11    Medications: I have reviewed the patient's current medications.  Assessment/Plan: 1. 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 Staging CTs 10/26/2017-asymmetric esophageal thickening, no evidence of metastatic disease ? Endoscopic biopsy 10/20/2017-mid esophagus partially obstructing mass, biopsy confirmed squamous cell carcinoma ? PET scan 11/02/2017-wall thickening in the mid esophagus SUV 27 MA: No findings specific for metastatic disease ? Radiation 11/09/2017-12/17/2017 ? Cycle 1 weekly Taxol/carboplatin 11/10/2017 ? Cycle 2 weekly Taxol/carboplatin 11/17/2017 (carboplatin held due to thrombocytopenia) ? Cycle 3 weekly Taxol/carboplatin 12/08/2017 (dosesreduced due to neutropenia, thrombocytopenia) ? Cycle 4 weekly Taxol/carboplatin 12/15/2017 ? Endoscopy 03/29/2018-no mass seen ? CT chest 12/29/2018-2 cm area associated with anterior wall the proximal esophagus, new-nonspecific, changes of cirrhosis ? PD-L1 CPS 5% ? Cycle 1 FOLFOX 02/07/2019 ? Cycle 2 FOLFOX 03/07/2019, Neulasta ? Cycle 3 FOLFOX 03/29/2019, Neulasta (oxaliplatin dose reduced to 40 mg/m due to thrombocytopenia following cycle 2) ? Cycle 4 FOLFOX 04/12/2019 ? Cycle 5 FOLFOX 04/26/2019 ? Cycle 6 FOLFOX 05/11/2019 ? CT chest 05/20/2019-enlargement of mass at the upper esophagus, changes of cirrhosis and emphysema ? Cycle 1 nivolumab 05/25/2019 ? Cycle 2 nivolumab 06/09/2019 ? Cycle 3 nivolumab 06/24/2019  ? Cycle 4 nivolumab 07/07/2019 2. Solid dysphagia and odynophagia secondary to #1 3. Coronary artery disease 4. History of prostate cancer 5. COPD 6. Permanent cardiac  pacemaker 7. Peripheral vascular disease 8. Chronic thrombocytopenia 9. 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 CT chest 12/28/2017-no evidence of pulmonary emboli. Esophageal thickening consistent with patient's history of esophageal carcinoma. 11. Probable cirrhosis, referred to GI 12. Anemia-progressive compared to when he was here in June, potentially related to GI bleeding or epistaxis. Red cell transfusions 03/21/2019 and 04/28/2019 13. 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 Status post evaluation by [name], not a candidate for vocal fold injection at present. 14. Port-A-Cath placed 02/02/2011 15. Neutropenia secondary to chemotherapy 16. 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 Admission 07/19/2019 with hemoptysis  Mr. Wambold appears stable.  He has local recurrence of esophageal cancer.  He is symptomatic with dysphagia and hemoptysis.  CT scan showed enlargement of the esophageal mass with tracheal invasion.  Hemoptysis is likely related to this.  Radiation oncology has been notified of the patient's admission and request for palliative radiation to his enlarging esophageal mass.  He is scheduled for CT simulation at 11 AM this morning.  Systemic treatment options are limited given his continued progression of disease following FOLFOX nivolumab.  Recommend goals of care discussion by palliative care team.  Family meeting scheduled for tomorrow morning.  Hemoglobin improved this morning.  Recommendations: 1.   Radiation oncology consult 2.   Agree with palliative care consult and meeting is planned for tomorrow according to their note.  We will continue ongoing goals of care and CODE STATUS discussions.  Plan to call daughter later today. 3.  Discontinue plan for further nivolumab 4.   IV fluids    LOS: 1 day   [name], NP   07/21/2019, 9:40 AM Mr.  Mullin appears unchanged.  He continues to have hemoptysis.  He underwent radiation simulation today. He understands the goal of radiation is to palliate his symptoms of dysphagia and bleeding.  We discussed CPR and ACLS issues.  He agrees to a no CODE BLUE status.  Mr. Pfiffner appears to be a candidate for home hospice care.  He agrees to a hospice referral.  He is concerned that he is not getting adequate nutrition or fluids.  We discussed the indication for a feeding tube.  This can be further discussed during the palliative care meeting tomorrow.  I recommend beginning IV fluids while discussions on tube feedings are taking place.  I attempted to call his daughter-in-law at approximately 5:30 PM and there was no answer.

## 2019-07-21 NOTE — Progress Notes (Signed)
Pt continues to decline cpap.  Pt encouraged to call RT should he change his mind.

## 2019-07-22 DIAGNOSIS — R627 Adult failure to thrive: Secondary | ICD-10-CM

## 2019-07-22 DIAGNOSIS — R490 Dysphonia: Secondary | ICD-10-CM

## 2019-07-22 DIAGNOSIS — R1310 Dysphagia, unspecified: Secondary | ICD-10-CM

## 2019-07-22 DIAGNOSIS — Z66 Do not resuscitate: Secondary | ICD-10-CM

## 2019-07-22 DIAGNOSIS — R509 Fever, unspecified: Secondary | ICD-10-CM

## 2019-07-22 DIAGNOSIS — C159 Malignant neoplasm of esophagus, unspecified: Secondary | ICD-10-CM

## 2019-07-22 LAB — BASIC METABOLIC PANEL
Anion gap: 8 (ref 5–15)
BUN: 15 mg/dL (ref 8–23)
CO2: 25 mmol/L (ref 22–32)
Calcium: 8.3 mg/dL — ABNORMAL LOW (ref 8.9–10.3)
Chloride: 105 mmol/L (ref 98–111)
Creatinine, Ser: 0.92 mg/dL (ref 0.61–1.24)
GFR calc Af Amer: >60 mL/min (ref >60)
GFR calc non Af Amer: >60 mL/min (ref >60)
Glucose, Bld: 92 mg/dL (ref 70–99)
Potassium: 3.8 mmol/L (ref 3.5–5.1)
Sodium: 138 mmol/L (ref 135–145)

## 2019-07-22 LAB — CBC
HCT: 29.3 % — ABNORMAL LOW (ref 39.0–52.0)
Hemoglobin: 9.3 g/dL — ABNORMAL LOW (ref 13.0–17.0)
MCH: 29.5 pg (ref 26.0–34.0)
MCHC: 31.7 g/dL (ref 30.0–36.0)
MCV: 93 fL (ref 80.0–100.0)
Platelets: 91 10*3/uL — ABNORMAL LOW (ref 150–400)
RBC: 3.15 MIL/uL — ABNORMAL LOW (ref 4.22–5.81)
RDW: 15.9 % — ABNORMAL HIGH (ref 11.5–15.5)
WBC: 3.9 10*3/uL — ABNORMAL LOW (ref 4.0–10.5)
nRBC: 0 % (ref 0.0–0.2)

## 2019-07-22 MED ORDER — HYDROCODONE-ACETAMINOPHEN 7.5-325 MG/15ML PO SOLN
10.0000 mL | ORAL | Status: DC | PRN
  Administered 2019-07-22 – 2019-07-24 (×8): 10 mL via ORAL
  Filled 2019-07-22 (×10): qty 15

## 2019-07-22 MED ORDER — MAGNESIUM HYDROXIDE 400 MG/5ML PO SUSP
30.0000 mL | Freq: Every day | ORAL | Status: DC | PRN
  Administered 2019-07-22 – 2019-07-26 (×2): 30 mL via ORAL
  Filled 2019-07-22 (×2): qty 30

## 2019-07-22 MED ORDER — LIDOCAINE VISCOUS HCL 2 % MT SOLN
15.0000 mL | Freq: Three times a day (TID) | OROMUCOSAL | Status: DC
  Administered 2019-07-22 – 2019-07-24 (×5): 15 mL via OROMUCOSAL
  Filled 2019-07-22 (×10): qty 15

## 2019-07-22 MED ORDER — METOPROLOL TARTRATE 5 MG/5ML IV SOLN
2.5000 mg | Freq: Four times a day (QID) | INTRAVENOUS | Status: DC
  Administered 2019-07-22 – 2019-07-27 (×18): 2.5 mg via INTRAVENOUS
  Filled 2019-07-22 (×20): qty 5

## 2019-07-22 MED ORDER — PIPERACILLIN-TAZOBACTAM 3.375 G IVPB
3.3750 g | Freq: Three times a day (TID) | INTRAVENOUS | Status: DC
  Administered 2019-07-22 – 2019-07-27 (×14): 3.375 g via INTRAVENOUS
  Filled 2019-07-22 (×16): qty 50

## 2019-07-22 NOTE — Care Management Important Message (Signed)
Important Message  Patient Details IM Letter given to Marney Doctor RN Case Manager to present to the Patient Name: [name]. MRN: [id number] Date of Birth: [birth year]   Medicare Important Message Given:  Yes     Kerin Salen 07/22/2019, 10:44 AM

## 2019-07-22 NOTE — Progress Notes (Signed)
[facility]                                                                              Patient Demographics  [name], is a 85 y.o. male, DOB - 04-04-1934, [id number]  Admit date - 07/19/2019   Admitting Physician Curtis Reeve, DO  Outpatient Primary MD for the patient is Curtis Downing, MD  Outpatient specialists:   LOS - 2  days   Medical records reviewed and are as summarized below:    Chief Complaint  Patient presents with  . Hemoptysis       Brief summary   Patient is 85 year old male with COPD, CAD, GERD, paroxysmal A. fib, pacemaker, PVD, history of prostate CA, esophageal CA presented with worsening hemoptysis and hematemesis.  No fevers or chills.  Has hard time eating, choking sensation and increasing dysphagia. Feels dizzy with ambulation. Per ED MD, oncology was consulted and recommended XRT  Assessment & Plan    Esophageal malignancy with hemoptysis and hematemesis Acute on chronic anemia, possibly chemotherapy-induced pancytopenia -Patient has history of locally recurrent esophageal cancer, currently on nivolumab, following Dr. Benay Sims now symptomatic with dysphagia and hemoptysis -CT showed enlargement of the upper esophageal mass with evidence of tracheal invasion. -Per oncology systemic treatment options are limited now as he has progressed following the FOLFOX and nivolumab, recommending palliative radiation, doubt hemoptysis will be unable to endoscopic therapy or embolization. -Per GI, no role of endoscopy at this time and recommended oncology and XRT -Status post 1 unit packed RBCs on 5/5, H&H stable 9.3 -Radiation oncology consulted, CT simulation on 5/6, recommended 5 weeks of definitive XRT starting 5/10  -Patient has outpatient follow-up with ENT at [facility] next week -Appreciate oncology recommendations, palliative goals of care treatment today -Patient " thinking about" feeding tube, will defer to  palliative goals of care meeting  Active Problems: CAD status post CABG, history of paroxysmal A. fib, history of aortic stenosis, pacemaker, AAA -Currently normal sinus rhythm BP currently stable, states having difficulty swallowing pills, changed to beta-blocker to IV Hold off on losartan   OSA -CPAP if able to tolerate  GERD Continue PPI  Fever Overnight temp of 100.8 F, rhonchorous lung sounds, likely developing aspiration pneumonia -For now placed on IV Zofran, once tolerating pills, will change to oral Augmentin   Code Status: Currently full CODE STATUS DVT Prophylaxis:  SCD's Family Communication: Discussed all imaging results, lab results, explained to the patient  Disposition Plan:     Status is: Inpatient  The patient will require care spanning > 2 midnights and should be moved to inpatient because: Ongoing diagnostic testing needed not appropriate for outpatient work up.    Dispo: The patient is from: Home              Anticipated d/c is to: Home              Anticipated d/c date is: 2 days              Patient currently is not medically stable to d/c.  Plan for goals of care, undergoing CT simulation    Time  Spent in minutes   35 minutes  Procedures:  None  Consultants:   GI Oncology Radiation oncology  Antimicrobials:   Anti-infectives (From admission, onward)   None         Medications  Scheduled Meds: . acetaminophen  650 mg Oral BID  . atorvastatin  40 mg Oral q1800  . Chlorhexidine Gluconate Cloth  6 each Topical Daily  . feeding supplement (ENSURE ENLIVE)  237 mL Oral BID BM  . losartan  25 mg Oral QHS  . metoprolol tartrate  2.5 mg Intravenous Q6H  . pantoprazole (PROTONIX) IV  40 mg Intravenous Q12H   Continuous Infusions: . sodium chloride 50 mL/hr at 07/21/19 2144   PRN Meds:.prochlorperazine, Resource ThickenUp Clear, senna      Subjective:   [name] was seen and examined today.  Overnight spiking fever 100.8  F, coughing, rhonchorous lung sounds.  States he is unable to swallow any pills, difficulty eating. Patient denies dizziness, abdominal pain, new weakness, numbess, tingling.   Objective:   Vitals:   07/21/19 1258 07/21/19 2129 07/22/19 0608 07/22/19 1331  BP: (!) 116/57 133/72 128/70 124/66  Pulse: 77 100 85 98  Resp: 18 18 17 16   Temp: 98.9 F (37.2 C) (!) 100.8 F (38.2 C) 99.7 F (37.6 C) 99.9 F (37.7 C)  TempSrc:  Oral Oral Oral  SpO2: 92% 95% 90% 90%  Weight:      Height:       No intake or output data in the 24 hours ending 07/22/19 1432   Wt Readings from Last 3 Encounters:  07/19/19 77 kg  07/07/19 77.9 kg  06/24/19 78.1 kg   Physical Exam  General: Alert and oriented x 3, NAD, hoarseness of voice  Cardiovascular: S1 S2 clear, RRR. No pedal edema b/l  Respiratory bilateral scattered rhonchi  Gastrointestinal: Soft, nontender, nondistended, NBS  Ext: no pedal edema bilaterally  Neuro: no new deficits  Musculoskeletal: No cyanosis, clubbing  Skin: No rashes  Psych: Normal affect and demeanor, alert and oriented x3     Data Reviewed:  I have personally reviewed following labs and imaging studies  Micro Results Recent Results (from the past 240 hour(s))  Respiratory Panel by RT PCR (Flu A&B, Covid) - Nasopharyngeal Swab     Status: None   Collection Time: 07/19/19 10:54 AM   Specimen: Nasopharyngeal Swab  Result Value Ref Range Status   SARS Coronavirus 2 by RT PCR NEGATIVE NEGATIVE Final    Comment: (NOTE) SARS-CoV-2 target nucleic acids are NOT DETECTED. The SARS-CoV-2 RNA is generally detectable in upper respiratoy specimens during the acute phase of infection. The lowest concentration of SARS-CoV-2 viral copies this assay can detect is 131 copies/mL. A negative result does not preclude SARS-Cov-2 infection and should not be used as the sole basis for treatment or other patient management decisions. A negative result may occur with  improper  specimen collection/handling, submission of specimen other than nasopharyngeal swab, presence of viral mutation(s) within the areas targeted by this assay, and inadequate number of viral copies (<131 copies/mL). A negative result must be combined with clinical observations, patient history, and epidemiological information. The expected result is Negative. Fact Sheet for Patients:  PinkCheek.be Fact Sheet for Healthcare Providers:  GravelBags.it This test is not yet ap proved or cleared by the [place] FDA and  has been authorized for detection and/or diagnosis of SARS-CoV-2 by FDA under an Emergency Use Authorization (EUA). This EUA will remain  in effect (meaning this  test can be used) for the duration of the COVID-19 declaration under Section 564(b)(1) of the Act, 21 U.S.C. section 360bbb-3(b)(1), unless the authorization is terminated or revoked sooner.    Influenza A by PCR NEGATIVE NEGATIVE Final   Influenza B by PCR NEGATIVE NEGATIVE Final    Comment: (NOTE) The Xpert Xpress SARS-CoV-2/FLU/RSV assay is intended as an aid in  the diagnosis of influenza from Nasopharyngeal swab specimens and  should not be used as a sole basis for treatment. Nasal washings and  aspirates are unacceptable for Xpert Xpress SARS-CoV-2/FLU/RSV  testing. Fact Sheet for Patients: PinkCheek.be Fact Sheet for Healthcare Providers: GravelBags.it This test is not yet approved or cleared by the [place] FDA and  has been authorized for detection and/or diagnosis of SARS-CoV-2 by  FDA under an Emergency Use Authorization (EUA). This EUA will remain  in effect (meaning this test can be used) for the duration of the  Covid-19 declaration under Section 564(b)(1) of the Act, 21  U.S.C. section 360bbb-3(b)(1), unless the authorization is  terminated or revoked. Performed at [facility], [place] [address], [place], [place] [address]     Radiology Reports CT Chest W Contrast  Result Date: 07/19/2019 CLINICAL DATA:  Worsening hemoptysis over the past 2 weeks. History of esophageal cancer. EXAM: CT CHEST WITH CONTRAST TECHNIQUE: Multidetector CT imaging of the chest was performed during intravenous contrast administration. CONTRAST:  68mL OMNIPAQUE IOHEXOL 300 MG/ML  SOLN COMPARISON:  CT chest dated May 20, 2019. FINDINGS: Cardiovascular: Normal heart size status post CABG and AVR. No thoracic aortic aneurysm or dissection. Coronary, aortic arch, and branch vessel atherosclerotic vascular disease. No central pulmonary embolism. Unchanged right chest wall pacemaker and left chest wall port catheter. Mediastinum/Nodes: Progressive enlargement of the ill-defined mass associated with the esophagus measuring 4.5 x 3.1 x 5.9 cm, previously 3.3 x 2.6 x 4.1 cm. The mass now invades the posterior wall of the trachea (series 4, image 87; series 2, images 27-35). There is some new fluid signal and gas within the mass that may reflect necrosis. No enlarged mediastinal, hilar, or axillary lymph nodes. The thyroid gland is unremarkable. Lungs/Pleura: Centrilobular paraseptal emphysema again noted. Unchanged juxtapleural scarring in the posterior right upper lobe. Chronic mild central peribronchial thickening. No focal consolidation, pleural effusion, or pneumothorax. Previously seen 10 mm nodular density in the left upper lobe has decreased in size, now measuring 6 mm (series 7, image 74). Unchanged 5 mm ground-glass nodule in the left upper lobe (series 7, image 63), stable since March 2017. Small calcified granuloma in the left upper lobe again noted. No new pulmonary nodule. Upper Abdomen: No acute abnormality. Unchanged cirrhosis. Unchanged bilateral nonobstructive renal calculi. Musculoskeletal: No chest wall abnormality. No acute or significant osseous findings. IMPRESSION: 1.  Progressive enlargement of the necrotic mass associated with the upper esophagus, now measuring 4.5 x 3.1 x 5.9 cm, previously 3.3 x 2.6 x 4.1 cm, with new invasion of the posterior wall of the trachea. 2. Previously seen 10 mm nodular density in the left upper lobe has decreased in size, now measuring 6 mm, consistent with resolving infection or inflammation. 3. Unchanged cirrhosis. 4. Unchanged bilateral nonobstructive nephrolithiasis. 5. Aortic Atherosclerosis (ICD10-I70.0) and Emphysema (ICD10-J43.9). Electronically Signed   By: Titus Dubin M.D.   On: 07/19/2019 13:42   DG Chest Port 1 View  Result Date: 07/19/2019 CLINICAL DATA:  Cough, history of throat cancer EXAM: PORTABLE CHEST 1 VIEW COMPARISON:  12/15/2018 FINDINGS: Left chest wall  port catheter tip overlies SVC. No new consolidation or edema. No pleural effusion or pneumothorax. Stable cardiomediastinal contours with normal heart size. Valve replacement and right chest wall dual lead pacemaker noted. IMPRESSION: No acute process in the chest. Electronically Signed   By: Macy Mis M.D.   On: 07/19/2019 12:11   DG Swallowing Func-Speech Pathology  Result Date: 07/21/2019 Objective Swallowing Evaluation: Type of Study: MBS-Modified Barium Swallow Study  Patient Details Name: [name]. MRN: [id number] Date of Birth: Jun 13, 1934 Today's Date: 07/21/2019 Time: SLP Start Time (ACUTE ONLY): 1235 -SLP Stop Time (ACUTE ONLY): 1300 SLP Time Calculation (min) (ACUTE ONLY): 25 min Past Medical History: Past Medical History: Diagnosis Date . AAA (abdominal aortic aneurysm) ([place])   a. Last duplex - 3.8cm 01/2013 - due 01/2014. [name] Acne rosacea  . Alcoholic hepatitis with ascites 08/28/2017 . Anginal pain ([place]) 1996 . Aortic stenosis, mild   Last echo 05/04/12 +LVH . Arthritis   "shoulders" (09/01/2017) . Back pain   hx epidural injections . Baker's cyst   Lt. [name] CAD (coronary artery disease)   a. s/p MI/PTCA - Cx 1994. b. s/p CABG x5 in 1997. c. Abnl nuc  2003- occ SVG-RCA, patent seq SVG-OM1-dCxOM, patentl LIMA-LAD, patent, SVG-diag. d. Normal nuc 09/2012. . Carotid artery disease ([place])   a. Duplex 01/2013: mildly abnormal. Mild plaque without significant diameter reduction. Repeat recommended 11/15. [name] COPD (chronic obstructive pulmonary disease) ([place])  . Dizziness and giddiness 08/04/2013 . GERD (gastroesophageal reflux disease)  . H/O: GI bleed   mild, neg. colonoscopy . Heart murmur  . HOH (hard of hearing)  . HTN (hypertension)  . Hyperlipidemia  . Hypertensive heart disease  . Myocardial infarction ([place]) 1994 . OSA on CPAP  . OSA treated with BiPAP 11/20/2015  Severe with [facility] 38/hr . PAF (paroxysmal atrial fibrillation) ([place])   a. Remote per records.  . Pneumonia   history . Presence of permanent cardiac pacemaker   DOI 1999 with change out 2006; St Jude . Prostate cancer ([place]) ~ 2010  a. s/p radical retropubic prostatectomy with bilateral pelvic lymph node dissection . PVD (peripheral vascular disease) ([place])   a. Rt ext iliac stenosis, last PV cath 2003, moderate stenosis last Lower Ext Dopplers 12/16/11 - Rt. ABI 0.96  Lt ABI 1.0. . Sick sinus syndrome ([place])   a. placed 1999, gen change 2011 - St. Jude. . Sinus node dysfunction ([place]) 01/20/2013 . Syncope 11/28/97 . Thrombocytopenia ([facility])   chronic, mild . Valvular heart disease   a. Echo 04/2012: mild-mod AS, mild AI, mild MR. Past Surgical History: Past Surgical History: Procedure Laterality Date . APPENDECTOMY   . BIOPSY  10/20/2017  Procedure: BIOPSY;  Surgeon: Doran Stabler, MD;  Location: [facility];  Service: Gastroenterology;; . CARDIAC CATHETERIZATION  2003  VG to RCA occluded, other grafts patent . CARDIAC CATHETERIZATION N/A 05/04/2015  Procedure: Right/Left Heart Cath and Coronary/Graft Angiography;  Surgeon: Sherren Mocha, MD;  Location: [place] [facility];  Service: Cardiovascular;  Laterality: N/A; . CARDIAC CATHETERIZATION  09/01/2017 . CARDIAC VALVE REPLACEMENT   . CATARACT EXTRACTION W/  INTRAOCULAR LENS  IMPLANT, BILATERAL Bilateral  . COLONOSCOPY   . CORONARY ANGIOPLASTY  03/29/92  PTCA to LCX . CORONARY ARTERY BYPASS GRAFT  1997  LIMA-LAD; VG-diag; seq VG-1st OM & distal LCX; VG-RCA . CORONARY ATHERECTOMY N/A 09/01/2017  Procedure: CORONARY ATHERECTOMY;  Surgeon: Jettie Booze, MD;  Location: [place] [facility];  Service: Cardiovascular;  Laterality: N/A; . CORONARY ATHERECTOMY  10/01/2017 . CORONARY ATHERECTOMY N/A 10/01/2017  Procedure: CORONARY ATHERECTOMY;  Surgeon: Leonie Man, MD;  Location: [place] [facility];  Service: Cardiovascular;  Laterality: N/A; . ESOPHAGOGASTRODUODENOSCOPY (EGD) WITH PROPOFOL N/A 10/20/2017  Procedure: ESOPHAGOGASTRODUODENOSCOPY (EGD) WITH PROPOFOL;  Surgeon: Doran Stabler, MD;  Location: [facility];  Service: Gastroenterology;  Laterality: N/A; . ESOPHAGOGASTRODUODENOSCOPY (EGD) WITH PROPOFOL N/A 03/29/2018  Procedure: ESOPHAGOGASTRODUODENOSCOPY (EGD) WITH PROPOFOL;  Surgeon: Doran Stabler, MD;  Location: [facility];  Service: Gastroenterology;  Laterality: N/A; . INSERT / REPLACE / REMOVE PACEMAKER  11/28/1997  pacesetter--ERI 2011 . IR IMAGING GUIDED PORT INSERTION  02/02/2019 . LEFT HEART CATH N/A 09/01/2017  Procedure: Left Heart Cath;  Surgeon: Jettie Booze, MD;  Location: [place] [facility];  Service: Cardiovascular;  Laterality: N/A; . LEFT HEART CATH AND CORS/GRAFTS ANGIOGRAPHY N/A 08/28/2017  Procedure: LEFT HEART CATH AND CORS/GRAFTS ANGIOGRAPHY;  Surgeon: Leonie Man, MD;  Location: [place] [facility];  Service: Cardiovascular;  Laterality: N/A; . PACEMAKER GENERATOR CHANGE  08/15/2009  St. Jude accent . SUPRAPUBIC PROSTATECTOMY   . TEE WITHOUT CARDIOVERSION N/A 06/12/2015  Procedure: TRANSESOPHAGEAL ECHOCARDIOGRAM (TEE);  Surgeon: Sherren Mocha, MD;  Location: [place];  Service: Open Heart Surgery;  Laterality: N/A; . TONSILLECTOMY   . TRANSCATHETER AORTIC VALVE REPLACEMENT, TRANSFEMORAL N/A 06/12/2015  Procedure:  TRANSCATHETER AORTIC VALVE REPLACEMENT, TRANSFEMORAL, possible transpical;  Surgeon: Sherren Mocha, MD;  Location: [place];  Service: Open Heart Surgery;  Laterality: N/A; HPI: Patient is 85 year old male with COPD, CAD, GERD, paroxysmal A. fib, pacemaker, PVD, history of prostate CA, esophageal CA presented with worsening hemoptysis and hematemesis.  No fevers or chills.  Has hard time eating, choking sensation and increasing dysphagia  Subjective: Pt reports coughing with all PO intake (liquids and solids) Assessment / Plan / Recommendation CHL IP CLINICAL IMPRESSIONS 07/21/2019 Clinical Impression Pt was seen for a modified barium swallow study with PMH of esophageal cancer s/p XRT with recurrence/extention. 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 Pt demonstrated strong cough response t/o study, which was not correlative with penetration/aspiration instances. Pt demonstrates remarkable oropharyngeal swallow function for his advanced age and medical history. Oral phase was grossly WFL. Regular solids were not assessed, as his dentures were not available at the time. With soft solids, pt had good oral clearance and lingual control. Pharyngeally, pt demonstrates reduced laryngeal elevation, hyoid excursion, and epiglottic inversion, which ultimately resulted in trace penetration of thin liquids prior to the swallow and mild residue in valleculae with purees/solids after the swallow. Residue was improved with liquid wash/repeat swallow. Given sequential straw or cup sips, pt had stagnant trace penetration, so he is recommended to take single sips. Esophageal phase showed very mild retrograde bolus movement with thin liquids. At this time, pt is recommended for thin liquids and mechanical soft solids (regular once dentures are available), alternating liquids/solids, single sips. Of note, pt has plans to initiate upper esophageal XRT, which is very likely to impact swallow  function. He should follow up with OP SLP regarding PO intake r/t this treatment. SLP Visit Diagnosis Dysphagia, oropharyngeal phase (R13.12) Impact on safety and function Moderate aspiration risk;Mild aspiration risk     Prognosis 07/21/2019 Prognosis for Safe Diet Advancement Guarded Barriers to Reach Goals Other (Comment) Barriers/Prognosis Comment guarded d/t advanced age and medical dx CHL IP DIET RECOMMENDATION 07/21/2019 SLP Diet Recommendations Thin liquid;Dysphagia 3 (Mech soft) solids Liquid Administration via Straw;Cup Medication Administration Crushed with  puree Compensations Small sips/bites;Follow solids with liquid   CHL IP OTHER RECOMMENDATIONS 07/21/2019 Recommended Consults ENT appt next week; recommend SLP as OP as well Oral Care Recommendations Oral care BID   CHL IP FOLLOW UP RECOMMENDATIONS 07/21/2019 Follow up Recommendations Outpatient SLP   No flowsheet data found.     CHL IP ORAL PHASE 07/21/2019 Oral Phase WFL Oral - Regular Deferred d/t dentures being upstairs  CHL IP PHARYNGEAL PHASE 07/21/2019 Pharyngeal Phase Impaired Pharyngeal- Thin Cup Reduced epiglottic inversion;Reduced laryngeal elevation;Reduced anterior laryngeal mobility;Penetration/Aspiration before swallow Pharyngeal Material enters airway, remains ABOVE vocal cords then ejected out Pharyngeal- Thin Straw Penetration/Aspiration before swallow;Reduced laryngeal elevation;Reduced airway/laryngeal closure;Reduced anterior laryngeal mobility;Reduced epiglottic inversion Pharyngeal Material enters airway, remains ABOVE vocal cords then ejected out;Material enters airway, remains ABOVE vocal cords and not ejected out Pharyngeal- Puree Reduced pharyngeal peristalsis;Reduced epiglottic inversion;Pharyngeal residue - valleculae Pharyngeal Material does not enter airway Pharyngeal- Mechanical Soft WFL;Reduced pharyngeal peristalsis;Pharyngeal residue - valleculae  CHL IP CERVICAL ESOPHAGEAL PHASE 07/21/2019 Cervical Esophageal Phase [facility] [place] P.  Isenhour, M.S., CCC-SLP Speech-Language Pathologist Acute Rehabilitation Services Pager: [place] 07/21/2019, 1:59 PM                Lab Data:  CBC: Recent Labs  Lab 07/19/19 1048 07/19/19 1815 07/20/19 0611 07/20/19 2051 07/22/19 0539  WBC 3.6* 2.9* 3.0*  --  3.9*  HGB 8.4* 8.0* 7.7* 8.8* 9.3*  HCT 27.0* 25.5* 25.0* 27.1* 29.3*  MCV 93.4 93.4 93.3  --  93.0  PLT 117* 100* 93*  --  91*   Basic Metabolic Panel: Recent Labs  Lab 07/19/19 1048 07/20/19 0611 07/22/19 0539  NA 138 139 138  K 3.7 3.8 3.8  CL 107 107 105  CO2 22 24 25   GLUCOSE 105* 88 92  BUN 18 15 15   CREATININE 0.96 0.95 0.92  CALCIUM 8.7* 8.4* 8.3*   GFR: Estimated Creatinine Clearance: 56.9 mL/min (by C-G formula based on SCr of 0.92 mg/dL). Liver Function Tests: Recent Labs  Lab 07/19/19 1048  AST 27  ALT 15  ALKPHOS 192*  BILITOT 1.1  PROT 7.1  ALBUMIN 2.9*   No results for input(s): LIPASE, AMYLASE in the last 168 hours. No results for input(s): AMMONIA in the last 168 hours. Coagulation Profile: Recent Labs  Lab 07/19/19 1053  INR 1.2   Cardiac Enzymes: No results for input(s): CKTOTAL, CKMB, CKMBINDEX, TROPONINI in the last 168 hours. BNP (last 3 results) No results for input(s): PROBNP in the last 8760 hours. HbA1C: No results for input(s): HGBA1C in the last 72 hours. CBG: No results for input(s): GLUCAP in the last 168 hours. Lipid Profile: No results for input(s): CHOL, HDL, LDLCALC, TRIG, CHOLHDL, LDLDIRECT in the last 72 hours. Thyroid Function Tests: No results for input(s): TSH, T4TOTAL, FREET4, T3FREE, THYROIDAB in the last 72 hours. Anemia Panel: No results for input(s): VITAMINB12, FOLATE, FERRITIN, TIBC, IRON, RETICCTPCT in the last 72 hours. Urine analysis:    Component Value Date/Time   COLORURINE YELLOW 06/08/2015 1056   APPEARANCEUR CLEAR 06/08/2015 1056   LABSPEC 1.013 06/08/2015 1056   PHURINE 7.5 06/08/2015 1056   GLUCOSEU NEGATIVE  06/08/2015 1056   HGBUR TRACE (A) 06/08/2015 1056   BILIRUBINUR NEGATIVE 06/08/2015 1056   KETONESUR NEGATIVE 06/08/2015 1056   PROTEINUR NEGATIVE 06/08/2015 1056   UROBILINOGEN 0.2 09/26/2009 1906   NITRITE NEGATIVE 06/08/2015 [place] 06/08/2015 1056     Madelynn Malson M.D. [facility] 07/22/2019, 2:32 PM  Call night coverage person covering after 7pm

## 2019-07-22 NOTE — Progress Notes (Signed)
PHARMACY NOTE -  [facility] has been assisting with dosing of Zosyn for aspiration PNA.  Dosage remains stable at 3.375 g IV q8 hr and need for further dosage adjustment appears unlikely at present given stable, baseline renal function  Pharmacy will sign off, following peripherally for culture results or dose adjustments. Please reconsult if a change in clinical status warrants re-evaluation of dosage.  Reuel Boom, PharmD, BCPS [phone] 07/22/2019, 2:52 PM

## 2019-07-22 NOTE — Progress Notes (Signed)
Daily Progress Note   Patient Name: [name].       Date: 07/22/2019 DOB: Jul 05, 1934  Age: 85 y.o. MRN#: [id number] Attending Physician: Curtis Corning, MD Primary Care Physician: Curtis Downing, MD Admit Date: 07/19/2019  Reason for Consultation/Follow-up: Establishing goals of care  Subjective: C/o pain with swallowing, talking - very little PO intake  Length of Stay: 2  Current Medications: Scheduled Meds:  . acetaminophen  650 mg Oral BID  . atorvastatin  40 mg Oral q1800  . Chlorhexidine Gluconate Cloth  6 each Topical Daily  . feeding supplement (ENSURE ENLIVE)  237 mL Oral BID BM  . losartan  25 mg Oral QHS  . metoprolol tartrate  2.5 mg Intravenous Q6H  . pantoprazole (PROTONIX) IV  40 mg Intravenous Q12H    Continuous Infusions: . sodium chloride 50 mL/hr at 07/21/19 2144  . piperacillin-tazobactam (ZOSYN)  IV      PRN Meds: prochlorperazine, Resource ThickenUp Clear, senna  Physical Exam Constitutional:      General: He is not in acute distress. Pulmonary:     Effort: Pulmonary effort is normal. No respiratory distress.  Skin:    General: Skin is warm and dry.  Neurological:     Mental Status: He is alert and oriented to person, place, and time.             Vital Signs: BP 124/66 (BP Location: Left Arm)   Pulse 98   Temp 99.9 F (37.7 C) (Oral)   Resp 16   Ht '5\' 7"'  (1.702 m)   Wt 77 kg   SpO2 90%   BMI 26.59 kg/m  SpO2: SpO2: 90 % O2 Device: O2 Device: Room Air O2 Flow Rate:    Intake/output summary: No intake or output data in the 24 hours ending 07/22/19 1531 LBM: Last BM Date: 07/17/19 Baseline Weight: Weight: 77 kg Most recent weight: Weight: 77 kg       Palliative Assessment/Data: PPS 20% d/t Po intake    Flowsheet Rows     Most  Recent Value  Intake Tab  Referral Department  Hospitalist  Unit at Time of Referral  Med/Surg Unit  Palliative Care Primary Diagnosis  Cancer  Date Notified  07/20/19  Palliative Care Type  New Palliative care  Reason for referral  Clarify Goals of Care  Date of Admission  07/19/19  Date first seen by Palliative Care  07/20/19  # of days Palliative referral response time  0 Day(s)  # of days IP prior to Palliative referral  1  Clinical Assessment  Palliative Performance Scale Score  40%  Psychosocial & Spiritual Assessment  Palliative Care Outcomes  Patient/Family meeting held?  Yes      Patient Active Problem List   Diagnosis Date Noted  . Palliative care by specialist   . Cough with hemoptysis 07/19/2019  . Hemoptysis   . VT (ventricular tachycardia) ([place]) 05/22/2019  . LBBB (left bundle branch block) 05/22/2019  . Port-A-Cath in place 02/21/2019  . Goals of care, counseling/discussion 01/20/2019  . Unstable angina ([place])   . Presbycusis of both ears 04/08/2018  . 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 Anticoagulated by anticoagulation  treatment 03/19/2018  . Epistaxis 03/19/2018  . Pacemaker 02/19/2018  . Coronary artery disease involving coronary bypass graft of native heart without angina pectoris 02/19/2018  . Anemia 01/19/2018  . Hypercholesterolemia 12/28/2017  . Encounter for antineoplastic chemotherapy 12/08/2017  . Malignant tumor of middle third of esophagus ([place]) 10/28/2017  . Melena   . Heme positive stool   . Acute post-hemorrhagic anemia   . Esophageal mass   . Angina, class III ([place]) 10/01/2017  . Chest pain with moderate risk for cardiac etiology 09/01/2017  . Chest pain, rule out acute myocardial infarction 08/26/2017  . Essential hypertension 05/14/2016  . OSA (obstructive sleep apnea) 11/20/2015  . Obesity (BMI 30-39.9) 11/20/2015  . S/P TAVR (transcatheter aortic valve replacement) 06/12/2015  . Severe aortic stenosis 06/05/2015  . AAA  (abdominal aortic aneurysm) without rupture ([place]) 04/27/2015  . PAD (peripheral artery disease) ([place]) 04/27/2015  . Dizziness and giddiness 08/04/2013  . Dizziness 07/19/2013  . SSS (sick sinus syndrome) ([place]) 01/20/2013  . Hypertensive cardiovascular disease 01/20/2013  . Paroxysmal atrial fibrillation (HCC)   . CAD, multiple vessel   . Pacemaker -[employer]   . Aortic stenosis 05/07/2012  . Syncope 11/28/1997    Palliative Care Assessment & Plan   HPI:  85 y.o. male  with past medical history of esophageal cancer, CAD s/p CABG, aortic stenosis s/p replacement, SSS w/ pacemaker, AAA, and COPD admitted on 07/19/2019 with hemoptysis for 2 weeks. . CT revealed enlargement of esophagus mass with invasion of the posterior wall of the trachea. Systemic treatment options are limited and radiation has been recommended. PMT consulted for [name].   Assessment: Met with patient and family to include his daughter in law [name], daughter [name], and daughter [name] to discuss goals of care.  We first review his baseline status - patient tells me he was living independently - ambulatory and able to complete ADLs independently. His daughter and DIL live close by and are able to provide a lot of assistance. [name] did not have a great appetite and has lost about 20 pounds in one month.    We discussed patient's current illness and what it means in the larger context of patient's on-going co-morbidities.  Natural disease trajectory and expectations at EOL were discussed. We reviewed [name] disease process and plan for radiation. We discussed concern that [name] is nearing end of life. He shares he is not surprised by this and shares about his discussion with Dr. Benay Sims. Family is appropriately tearful.   I attempted to elicit values and goals of care important to the patient.  [name] is focused on quality of life for whatever time is left - he is not interested in artificially prolonging life.    We discuss his symptoms of poor appetite d/t odynophagia - he is interested in better pain management - we discuss increasing his hycet frequency and trying viscous lidocaine. He is agreeable to trying this. We discussed sipping high calorie drinks as he is able. Continue ensure.   We discuss a PEG tube - [name] would like to avoid this and family agrees. [name] wife had a PEG tube at the end of her life so family is very familiar with this. He would like to see how he does with radiation. We discuss chance that radiation may worsen symptoms.   We reviewed Mr P[place]thress conversation with Dr. Benay Sims about code status - Mr Stockley had elected DNR. Family was surprised by this as they  understood his wishes were for full code interventions. We discuss why DNR is appropriate. We discussed evidenced based poor outcomes in similar hospitalized patients, as the cause of the arrest is likely associated with chronic/terminal disease rather than a reversible acute cardio-pulmonary event.   Discussed with patient/family the importance of continued conversation with family and the medical providers regarding overall plan of care and treatment options, ensuring decisions are within the context of the patient's values and GOCs.    Hospice and Palliative Care services outpatient were explained and offered. Mr Jefferys and his family agree they are interested in the support of hospice care at home. We discuss philosophy of hospice care. We discuss how he can receive radiation under hospice care.   Questions and concerns were addressed. The family was encouraged to call with questions or concerns.   Discussed the above with Worthy Flank, PA with radiation oncology - discussed radiation under hospice benefit. Discussed above with Dr. Tana Coast.   Recommendations/Plan:  Code status to DNR  Patient hopes to avoid PEG tube - not interested in pursuing this at this time  Home with hospice - have discussed with  hospice liaison  Would like to continue palliative radiation   Increased hycet frequency, will try scheduled viscous lidocaine  Code Status:  DNR  Prognosis:   < 6 months  Discharge Planning:  Home with Hospice  Care plan was discussed with patient, daughter, DIL, hospice liaison, radiation oncology, Dr. Tana Coast, RN  Thank you for allowing the Palliative Medicine Team to assist in the care of this patient.   Total Time 95 minutes Prolonged Time Billed  yes       Greater than 50%  of this time was spent counseling and coordinating care related to the above assessment and plan.  Juel Burrow, DNP, [facility] Palliative Medicine Team Team Phone # [phone]  Pager [phone]

## 2019-07-22 NOTE — Progress Notes (Signed)
Offered CPAP to patient who continues to refuse.  Will be available if patient changes his mind.

## 2019-07-22 NOTE — Progress Notes (Addendum)
IP PROGRESS NOTE  Subjective:   He continues to have ongoing hemoptysis and dysphagia.  He has a choking sensation.  Was seen by radiation oncology yesterday and had CT simulation.  They are planning to begin radiation on Monday, 07/25/2019.  Palliative care meeting scheduled for later this morning.  Objective: Vital signs in last 24 hours: Blood pressure 128/70, pulse 85, temperature 99.7 F (37.6 C), temperature source Oral, resp. rate 17, height '5\' 7"'  (1.702 m), weight 77 kg, SpO2 90 %.  Intake/Output from previous day: No intake/output data recorded.  Physical Exam:  HEENT: No thrush Lungs: Right greater than left expiratory wheeze/rhonchi Cardiac: Regular rate and rhythm Abdomen: Nontender, no hepatosplenomegaly Extremities: No leg edema   Portacath/PICC-without erythema  Lab Results: Recent Labs    07/20/19 0611 07/20/19 0611 07/20/19 2051 07/22/19 0539  WBC 3.0*  --   --  3.9*  HGB 7.7*   < > 8.8* 9.3*  HCT 25.0*   < > 27.1* 29.3*  PLT 93*  --   --  91*   < > = values in this interval not displayed.    BMET Recent Labs    07/20/19 0611 07/22/19 0539  NA 139 138  K 3.8 3.8  CL 107 105  CO2 24 25  GLUCOSE 88 92  BUN 15 15  CREATININE 0.95 0.92  CALCIUM 8.4* 8.3*    Lab Results  Component Value Date   CEA1 2.08 02/07/2019    Studies/Results: DG Swallowing Func-Speech Pathology  Result Date: 07/21/2019 Objective Swallowing Evaluation: Type of Study: MBS-Modified Barium Swallow Study  Patient Details Name: [name]. MRN: [id number] Date of Birth: September 21, 1934 Today's Date: 07/21/2019 Time: SLP Start Time (ACUTE ONLY): 1235 -SLP Stop Time (ACUTE ONLY): 1300 SLP Time Calculation (min) (ACUTE ONLY): 25 min Past Medical History: Past Medical History: Diagnosis Date . AAA (abdominal aortic aneurysm) ([place])   a. Last duplex - 3.8cm 01/2013 - due 01/2014. [name] Acne rosacea  . Alcoholic hepatitis with ascites 08/28/2017 . Anginal pain ([place]) 1996 . Aortic stenosis,  mild   Last echo 05/04/12 +LVH . Arthritis   "shoulders" (09/01/2017) . Back pain   hx epidural injections . Baker's cyst   Lt. [name] CAD (coronary artery disease)   a. s/p MI/PTCA - Cx 1994. b. s/p CABG x5 in 1997. c. Abnl nuc 2003- occ SVG-RCA, patent seq SVG-OM1-dCxOM, patentl LIMA-LAD, patent, SVG-diag. d. Normal nuc 09/2012. . Carotid artery disease ([place])   a. Duplex 01/2013: mildly abnormal. Mild plaque without significant diameter reduction. Repeat recommended 11/15. [name] COPD (chronic obstructive pulmonary disease) ([place])  . Dizziness and giddiness 08/04/2013 . GERD (gastroesophageal reflux disease)  . H/O: GI bleed   mild, neg. colonoscopy . Heart murmur  . HOH (hard of hearing)  . HTN (hypertension)  . Hyperlipidemia  . Hypertensive heart disease  . Myocardial infarction ([place]) 1994 . OSA on CPAP  . OSA treated with BiPAP 11/20/2015  Severe with [facility] 38/hr . PAF (paroxysmal atrial fibrillation) ([place])   a. Remote per records.  . Pneumonia   history . Presence of permanent cardiac pacemaker   DOI 1999 with change out 2006; St Jude . Prostate cancer ([place]) ~ 2010  a. s/p radical retropubic prostatectomy with bilateral pelvic lymph node dissection . PVD (peripheral vascular disease) ([place])   a. Rt ext iliac stenosis, last PV cath 2003, moderate stenosis last Lower Ext Dopplers 12/16/11 - Rt. ABI 0.96  Lt ABI 1.0. . Sick sinus syndrome ([place])   a.  placed 1999, gen change 2011 - St. Jude. . Sinus node dysfunction ([place]) 01/20/2013 . Syncope 11/28/97 . Thrombocytopenia (HCC)   chronic, mild . Valvular heart disease   a. Echo 04/2012: mild-mod AS, mild AI, mild MR. Past Surgical History: Past Surgical History: Procedure Laterality Date . APPENDECTOMY   . BIOPSY  10/20/2017  Procedure: BIOPSY;  Surgeon: Doran Stabler, MD;  Location: [facility];  Service: Gastroenterology;; . CARDIAC CATHETERIZATION  2003  VG to RCA occluded, other grafts patent . CARDIAC CATHETERIZATION N/A 05/04/2015  Procedure: Right/Left Heart Cath and  Coronary/Graft Angiography;  Surgeon: Sherren Mocha, MD;  Location: [place] [facility];  Service: Cardiovascular;  Laterality: N/A; . CARDIAC CATHETERIZATION  09/01/2017 . CARDIAC VALVE REPLACEMENT   . CATARACT EXTRACTION W/ INTRAOCULAR LENS  IMPLANT, BILATERAL Bilateral  . COLONOSCOPY   . CORONARY ANGIOPLASTY  03/29/92  PTCA to LCX . CORONARY ARTERY BYPASS GRAFT  1997  LIMA-LAD; VG-diag; seq VG-1st OM & distal LCX; VG-RCA . CORONARY ATHERECTOMY N/A 09/01/2017  Procedure: CORONARY ATHERECTOMY;  Surgeon: Jettie Booze, MD;  Location: [place] [facility];  Service: Cardiovascular;  Laterality: N/A; . CORONARY ATHERECTOMY  10/01/2017 . CORONARY ATHERECTOMY N/A 10/01/2017  Procedure: CORONARY ATHERECTOMY;  Surgeon: Leonie Man, MD;  Location: [place] [facility];  Service: Cardiovascular;  Laterality: N/A; . ESOPHAGOGASTRODUODENOSCOPY (EGD) WITH PROPOFOL N/A 10/20/2017  Procedure: ESOPHAGOGASTRODUODENOSCOPY (EGD) WITH PROPOFOL;  Surgeon: Doran Stabler, MD;  Location: [facility];  Service: Gastroenterology;  Laterality: N/A; . ESOPHAGOGASTRODUODENOSCOPY (EGD) WITH PROPOFOL N/A 03/29/2018  Procedure: ESOPHAGOGASTRODUODENOSCOPY (EGD) WITH PROPOFOL;  Surgeon: Doran Stabler, MD;  Location: [facility];  Service: Gastroenterology;  Laterality: N/A; . INSERT / REPLACE / REMOVE PACEMAKER  11/28/1997  pacesetter--ERI 2011 . IR IMAGING GUIDED PORT INSERTION  02/02/2019 . LEFT HEART CATH N/A 09/01/2017  Procedure: Left Heart Cath;  Surgeon: Jettie Booze, MD;  Location: [place] [facility];  Service: Cardiovascular;  Laterality: N/A; . LEFT HEART CATH AND CORS/GRAFTS ANGIOGRAPHY N/A 08/28/2017  Procedure: LEFT HEART CATH AND CORS/GRAFTS ANGIOGRAPHY;  Surgeon: Leonie Man, MD;  Location: [place] [facility];  Service: Cardiovascular;  Laterality: N/A; . PACEMAKER GENERATOR CHANGE  08/15/2009  [employer]. Jude accent . SUPRAPUBIC PROSTATECTOMY   . TEE WITHOUT CARDIOVERSION N/A 06/12/2015  Procedure: TRANSESOPHAGEAL  ECHOCARDIOGRAM (TEE);  Surgeon: Sherren Mocha, MD;  Location: [place];  Service: Open Heart Surgery;  Laterality: N/A; . TONSILLECTOMY   . TRANSCATHETER AORTIC VALVE REPLACEMENT, TRANSFEMORAL N/A 06/12/2015  Procedure: TRANSCATHETER AORTIC VALVE REPLACEMENT, TRANSFEMORAL, possible transpical;  Surgeon: Sherren Mocha, MD;  Location: [place];  Service: Open Heart Surgery;  Laterality: N/A; HPI: Patient is 85 year old male with COPD, CAD, GERD, paroxysmal A. fib, pacemaker, PVD, history of prostate CA, esophageal CA presented with worsening hemoptysis and hematemesis.  No fevers or chills.  Has hard time eating, choking sensation and increasing dysphagia  Subjective: Pt reports coughing with all PO intake (liquids and solids) Assessment / Plan / Recommendation CHL IP CLINICAL IMPRESSIONS 07/21/2019 Clinical Impression Pt was seen for a modified barium swallow study with PMH of esophageal cancer s/p XRT with recurrence/extention. 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 Pt demonstrated strong cough response t/o study, which was not correlative with penetration/aspiration instances. Pt demonstrates remarkable oropharyngeal swallow function for his advanced age and medical history. Oral phase was grossly WFL. Regular solids were not assessed, as his dentures were not available at the time. With soft  solids, pt had good oral clearance and lingual control. Pharyngeally, pt demonstrates reduced laryngeal elevation, hyoid excursion, and epiglottic inversion, which ultimately resulted in trace penetration of thin liquids prior to the swallow and mild residue in valleculae with purees/solids after the swallow. Residue was improved with liquid wash/repeat swallow. Given sequential straw or cup sips, pt had stagnant trace penetration, so he is recommended to take single sips. Esophageal phase showed very mild retrograde bolus movement with thin liquids. At this time, pt is recommended for  thin liquids and mechanical soft solids (regular once dentures are available), alternating liquids/solids, single sips. Of note, pt has plans to initiate upper esophageal XRT, which is very likely to impact swallow function. He should follow up with OP SLP regarding PO intake r/t this treatment. SLP Visit Diagnosis Dysphagia, oropharyngeal phase (R13.12) Impact on safety and function Moderate aspiration risk;Mild aspiration risk     Prognosis 07/21/2019 Prognosis for Safe Diet Advancement Guarded Barriers to Reach Goals Other (Comment) Barriers/Prognosis Comment guarded d/t advanced age and medical dx CHL IP DIET RECOMMENDATION 07/21/2019 SLP Diet Recommendations Thin liquid;Dysphagia 3 (Mech soft) solids Liquid Administration via Straw;Cup Medication Administration Crushed with puree Compensations Small sips/bites;Follow solids with liquid   CHL IP OTHER RECOMMENDATIONS 07/21/2019 Recommended Consults ENT appt next week; recommend SLP as OP as well Oral Care Recommendations Oral care BID   CHL IP FOLLOW UP RECOMMENDATIONS 07/21/2019 Follow up Recommendations Outpatient SLP   No flowsheet data found.     CHL IP ORAL PHASE 07/21/2019 Oral Phase WFL Oral - Regular Deferred d/t dentures being upstairs  CHL IP PHARYNGEAL PHASE 07/21/2019 Pharyngeal Phase Impaired Pharyngeal- Thin Cup Reduced epiglottic inversion;Reduced laryngeal elevation;Reduced anterior laryngeal mobility;Penetration/Aspiration before swallow Pharyngeal Material enters airway, remains ABOVE vocal cords then ejected out Pharyngeal- Thin Straw Penetration/Aspiration before swallow;Reduced laryngeal elevation;Reduced airway/laryngeal closure;Reduced anterior laryngeal mobility;Reduced epiglottic inversion Pharyngeal Material enters airway, remains ABOVE vocal cords then ejected out;Material enters airway, remains ABOVE vocal cords and not ejected out Pharyngeal- Puree Reduced pharyngeal peristalsis;Reduced epiglottic inversion;Pharyngeal residue - valleculae  Pharyngeal Material does not enter airway Pharyngeal- Mechanical Soft WFL;Reduced pharyngeal peristalsis;Pharyngeal residue - valleculae  CHL IP CERVICAL ESOPHAGEAL PHASE 07/21/2019 Cervical Esophageal Phase [facility] [place] P. Isenhour, M.S., CCC-SLP Speech-Language Pathologist Acute Rehabilitation Services Pager: [place] 07/21/2019, 1:59 PM                Medications: I have reviewed the patient's current medications.  Assessment/Plan: 1. 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 Staging CTs 10/26/2017-asymmetric esophageal thickening, no evidence of metastatic disease ? Endoscopic biopsy 10/20/2017-mid esophagus partially obstructing mass, biopsy confirmed squamous cell carcinoma ? PET scan 11/02/2017-wall thickening in the mid esophagus SUV 27 MA: No findings specific for metastatic disease ? Radiation 11/09/2017-12/17/2017 ? Cycle 1 weekly Taxol/carboplatin 11/10/2017 ? Cycle 2 weekly Taxol/carboplatin 11/17/2017 (carboplatin held due to thrombocytopenia) ? Cycle 3 weekly Taxol/carboplatin 12/08/2017 (dosesreduced due to neutropenia, thrombocytopenia) ? Cycle 4 weekly Taxol/carboplatin 12/15/2017 ? Endoscopy 03/29/2018-no mass seen ? CT chest 12/29/2018-2 cm area associated with anterior wall the proximal esophagus, new-nonspecific, changes of cirrhosis ? PD-L1 CPS 5% ? Cycle 1 FOLFOX 02/07/2019 ? Cycle 2 FOLFOX 03/07/2019, Neulasta ? Cycle 3 FOLFOX 03/29/2019, Neulasta (oxaliplatin dose reduced to 40 mg/m due to thrombocytopenia following cycle 2) ? Cycle 4 FOLFOX 04/12/2019 ? Cycle 5 FOLFOX 04/26/2019 ? Cycle 6 FOLFOX 05/11/2019 ? CT chest 05/20/2019-enlargement of mass at the upper esophagus, changes of cirrhosis and emphysema ? Cycle 1 nivolumab 05/25/2019 ?  Cycle 2 nivolumab 06/09/2019 ? Cycle 3 nivolumab 06/24/2019  ? Cycle 4 nivolumab 07/07/2019 2. Solid dysphagia and odynophagia secondary to #1 3. Coronary artery disease 4. History of prostate  cancer 5. COPD 6. Permanent cardiac pacemaker 7. Peripheral vascular disease 8. Chronic thrombocytopenia 9. 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 CT chest 12/28/2017-no evidence of pulmonary emboli. Esophageal thickening consistent with patient's history of esophageal carcinoma. 11. Probable cirrhosis, referred to GI 12. Anemia-progressive compared to when he was here in June, potentially related to GI bleeding or epistaxis. Red cell transfusions 03/21/2019 and 04/28/2019 13. 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 Status post evaluation by [name], not a candidate for vocal fold injection at present. 14. Port-A-Cath placed 02/02/2011 15. Neutropenia secondary to chemotherapy 16. 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 Admission 07/19/2019 with hemoptysis  Mr. Uher appears stable.  He has local recurrence of esophageal cancer.  He is symptomatic with dysphagia and hemoptysis.  CT scan showed enlargement of the esophageal mass with tracheal invasion.  Hemoptysis is likely related to this.  Radiation oncology has been notified of the patient's admission and request for palliative radiation to his enlarging esophageal mass.  They are planning to begin radiation on 07/25/2019.  Systemic treatment options are limited given his continued progression of disease following FOLFOX nivolumab.  Goals of care discussion scheduled for later this morning.  CODE STATUS has been changed to DNR/DNI.  A TOC referral has been placed for evaluation by hospice.  Recommendations: 1.   Proceed with radiation as scheduled starting 07/25/2019 2.   The patient is taking in very little p.o. so continue IV fluids for now.  The patient inquired about feeding tube placement and will hold off on this.  We are hopeful that radiation will improve his ability to swallow. 3.  Discontinue nivolumab. 4.   Referral has been placed to hospice. 5.  DNR/DNI. 6.  Consider stopping Lipitor, consolidate medical regimen for comfort 7.  Consider aspiration pneumonia for a persistent fever   LOS: 2 days   Curtis Bussing, NP   07/22/2019, 11:52 AM Mr. Weissinger fears unchanged.  The hemoglobin is stable.  He plans to meet with the palliative care service this morning.  I placed a referral for home hospice care.  He is not a candidate for further systemic therapy.  He is tolerating liquids.  I recommend continuing a liquid diet and hold on placing a feeding tube.  Hopefully the radiation will result in significant improvement in the dysphagia and bleeding.  He had a low-grade fever last night.  He could be developing an aspiration pneumonia.  Outpatient follow-up will be scheduled at the Cancer center.  Please call oncology over the weekend as needed.

## 2019-07-23 ENCOUNTER — Inpatient Hospital Stay (HOSPITAL_COMMUNITY): Payer: Medicare Other

## 2019-07-23 LAB — CBC
HCT: 17.7 % — ABNORMAL LOW (ref 39.0–52.0)
Hemoglobin: 5.3 g/dL — CL (ref 13.0–17.0)
MCH: 29.1 pg (ref 26.0–34.0)
MCHC: 29.9 g/dL — ABNORMAL LOW (ref 30.0–36.0)
MCV: 97.3 fL (ref 80.0–100.0)
Platelets: 51 10*3/uL — ABNORMAL LOW (ref 150–400)
RBC: 1.82 MIL/uL — ABNORMAL LOW (ref 4.22–5.81)
RDW: 15.7 % — ABNORMAL HIGH (ref 11.5–15.5)
WBC: 2.5 10*3/uL — ABNORMAL LOW (ref 4.0–10.5)
nRBC: 0 % (ref 0.0–0.2)

## 2019-07-23 LAB — TYPE AND SCREEN
ABO/RH(D): A POS
Antibody Screen: NEGATIVE
Unit division: 0
Unit division: 0
Unit division: 0

## 2019-07-23 LAB — BPAM RBC
Blood Product Expiration Date: 202105292359
Blood Product Expiration Date: 202105292359
Blood Product Expiration Date: 202105292359
ISSUE DATE / TIME: 202105051444
Unit Type and Rh: 6200
Unit Type and Rh: 6200
Unit Type and Rh: 6200

## 2019-07-23 LAB — PREPARE RBC (CROSSMATCH)

## 2019-07-23 MED ORDER — SODIUM CHLORIDE 0.9% IV SOLUTION: Freq: Once | INTRAVENOUS | Status: AC

## 2019-07-23 NOTE — Progress Notes (Signed)
Pt. Continues to refuse CPAP at this time.  Will be available if he changes his mind.

## 2019-07-23 NOTE — Progress Notes (Addendum)
CRITICAL VALUE ALERT  Critical Value: Hgb 5.3  Date & Time Notied:  07/23/19 @0700   Provider Notified: Dr, Tana Coast   Orders Received/Actions taken:

## 2019-07-23 NOTE — Progress Notes (Signed)
[facility]                                                                              Patient Demographics  [name], is a 85 y.o. male, DOB - [birth year], [id number]  Admit date - 07/19/2019   Admitting Physician Curtis Reeve, DO  Outpatient Primary MD for the patient is Curtis Downing, MD  Outpatient specialists:   LOS - 3  days   Medical records reviewed and are as summarized below:    Chief Complaint  Patient presents with  . Hemoptysis       Brief summary   Patient is 85 year old male with COPD, CAD, GERD, paroxysmal A. fib, pacemaker, PVD, history of prostate CA, esophageal CA presented with worsening hemoptysis and hematemesis.  No fevers or chills.  Has hard time eating, choking sensation and increasing dysphagia. Feels dizzy with ambulation. Per ED MD, oncology was consulted and recommended XRT  Assessment & Plan    Esophageal malignancy with hemoptysis and hematemesis Acute on chronic anemia, possibly chemotherapy-induced pancytopenia -Patient has history of locally recurrent esophageal cancer, currently on nivolumab, following Dr. Benay Sims now symptomatic with dysphagia and hemoptysis -CT showed enlargement of the upper esophageal mass with evidence of tracheal invasion. -Per oncology systemic treatment options are limited now as he has progressed following the FOLFOX and nivolumab, recommending palliative radiation, doubt hemoptysis will be unable to endoscopic therapy or embolization. -Per GI, no role of endoscopy at this time and recommended oncology and XRT -Radiation oncology consulted, CT simulation on 5/6, rec'd 5 weeks of definitive XRT starting 5/10  -Patient has outpatient follow-up with ENT at [facility] next week -Status post 1 unit packed RBCs on 5/5, hemoglobin today 5.3, will transfuse 2 units packed RBCs -Platelets now trending down, 51K, may need to transfuse 1 unit platelet pheresis, will monitor  counts  Active Problems: CAD status post CABG, history of paroxysmal A. fib, history of aortic stenosis, pacemaker, AAA -Currently normal sinus rhythm - BP currently stable, states having difficulty swallowing pills, changed to beta-blocker to IV - Hold off on losartan   OSA -CPAP if able to tolerate  GERD Continue PPI  Fevers/possible developing aspiration PNA -Continues to spike low-grade fevers, rhonchorous lung sounds, likely developing aspiration pneumonia -For now placed on IV Zofran, once tolerating pills, will change to oral Augmentin at discharge  -Chest x-ray with no active disease   Code Status: Currently full CODE STATUS DVT Prophylaxis:  SCD's Family Communication: Discussed all imaging results, lab results, explained to the patient and family member, [name] on phone   Disposition Plan:     Status is: Inpatient  The patient will require care spanning > 2 midnights and should be moved to inpatient because: Ongoing diagnostic testing needed not appropriate for outpatient work up.    Dispo: The patient is from: Home              Anticipated d/c is to: Home              Anticipated d/c date is: 2 days  Patient currently is not medically stable to d/c.  Plan for goals of care, undergoing CT simulation    Time Spent in minutes   35 minutes  Procedures:  None  Consultants:   GI Oncology Radiation oncology  Antimicrobials:   Anti-infectives (From admission, onward)   Start     Dose/Rate Route Frequency Ordered Stop   07/22/19 1500  piperacillin-tazobactam (ZOSYN) IVPB 3.375 g     3.375 g 12.5 mL/hr over 240 Minutes Intravenous Every 8 hours 07/22/19 1445           Medications  Scheduled Meds: . sodium chloride   Intravenous Once  . acetaminophen  650 mg Oral BID  . atorvastatin  40 mg Oral q1800  . Chlorhexidine Gluconate Cloth  6 each Topical Daily  . feeding supplement (ENSURE ENLIVE)  237 mL Oral BID BM  . lidocaine  15 mL  Mouth/Throat TID AC & HS  . losartan  25 mg Oral QHS  . metoprolol tartrate  2.5 mg Intravenous Q6H  . pantoprazole (PROTONIX) IV  40 mg Intravenous Q12H   Continuous Infusions: . piperacillin-tazobactam (ZOSYN)  IV 3.375 g (07/23/19 0620)   PRN Meds:.HYDROcodone-acetaminophen, magnesium hydroxide, prochlorperazine, Resource ThickenUp Clear, senna      Subjective:   [name] was seen and examined today.  Still spiking low-grade fever, 100.5 F this morning, coughing, rhonchorous lung sounds.  Still having hemoptysis, hemoglobin dropped down today.  States he is unable to swallow pills and having difficulty eating food.    Objective:   Vitals:   07/22/19 0608 07/22/19 1331 07/22/19 2059 07/23/19 0513  BP: 128/70 124/66 (!) 119/58 (!) 120/59  Pulse: 85 98 99 95  Resp: 17 16 16 20   Temp: 99.7 F (37.6 C) 99.9 F (37.7 C) 98.7 F (37.1 C) (!) 100.5 F (38.1 C)  TempSrc: Oral Oral Oral Oral  SpO2: 90% 90% 91% (!) 89%  Weight:      Height:        Intake/Output Summary (Last 24 hours) at 07/23/2019 1133 Last data filed at 07/23/2019 0300 Gross per 24 hour  Intake 1280.92 ml  Output --  Net 1280.92 ml     Wt Readings from Last 3 Encounters:  07/19/19 77 kg  07/07/19 77.9 kg  06/24/19 78.1 kg   Physical Exam  General: Alert and oriented x 3, NAD, hoarseness of the voice  Cardiovascular: S1 S2 clear, RRR. No pedal edema b/l  Respiratory: Bilateral scattered rhonchi  Gastrointestinal: Soft, nontender, nondistended, NBS  Ext: no pedal edema bilaterally  Neuro: no new deficits  Musculoskeletal: No cyanosis, clubbing  Skin: No rashes  Psych: Normal affect and demeanor, alert and oriented x3   Data Reviewed:  I have personally reviewed following labs and imaging studies  Micro Results Recent Results (from the past 240 hour(s))  Respiratory Panel by RT PCR (Flu A&B, Covid) - Nasopharyngeal Swab     Status: None   Collection Time: 07/19/19 10:54 AM    Specimen: Nasopharyngeal Swab  Result Value Ref Range Status   SARS Coronavirus 2 by RT PCR NEGATIVE NEGATIVE Final    Comment: (NOTE) SARS-CoV-2 target nucleic acids are NOT DETECTED. The SARS-CoV-2 RNA is generally detectable in upper respiratoy specimens during the acute phase of infection. The lowest concentration of SARS-CoV-2 viral copies this assay can detect is 131 copies/mL. A negative result does not preclude SARS-Cov-2 infection and should not be used as the sole basis for treatment or other patient management decisions. A negative  result may occur with  improper specimen collection/handling, submission of specimen other than nasopharyngeal swab, presence of viral mutation(s) within the areas targeted by this assay, and inadequate number of viral copies (<131 copies/mL). A negative result must be combined with clinical observations, patient history, and epidemiological information. The expected result is Negative. Fact Sheet for Patients:  PinkCheek.be Fact Sheet for Healthcare Providers:  GravelBags.it This test is not yet ap proved or cleared by the [place] FDA and  has been authorized for detection and/or diagnosis of SARS-CoV-2 by FDA under an Emergency Use Authorization (EUA). This EUA will remain  in effect (meaning this test can be used) for the duration of the COVID-19 declaration under Section 564(b)(1) of the Act, 21 U.S.C. section 360bbb-3(b)(1), unless the authorization is terminated or revoked sooner.    Influenza A by PCR NEGATIVE NEGATIVE Final   Influenza B by PCR NEGATIVE NEGATIVE Final    Comment: (NOTE) The Xpert Xpress SARS-CoV-2/FLU/RSV assay is intended as an aid in  the diagnosis of influenza from Nasopharyngeal swab specimens and  should not be used as a sole basis for treatment. Nasal washings and  aspirates are unacceptable for Xpert Xpress SARS-CoV-2/FLU/RSV  testing. Fact Sheet  for Patients: PinkCheek.be Fact Sheet for Healthcare Providers: GravelBags.it This test is not yet approved or cleared by the [place] FDA and  has been authorized for detection and/or diagnosis of SARS-CoV-2 by  FDA under an Emergency Use Authorization (EUA). This EUA will remain  in effect (meaning this test can be used) for the duration of the  Covid-19 declaration under Section 564(b)(1) of the Act, 21  U.S.C. section 360bbb-3(b)(1), unless the authorization is  terminated or revoked. Performed at [facility], [place] [address], [place], [place] [address]     Radiology Reports CT Chest W Contrast  Result Date: 07/19/2019 CLINICAL DATA:  Worsening hemoptysis over the past 2 weeks. History of esophageal cancer. EXAM: CT CHEST WITH CONTRAST TECHNIQUE: Multidetector CT imaging of the chest was performed during intravenous contrast administration. CONTRAST:  14mL OMNIPAQUE IOHEXOL 300 MG/ML  SOLN COMPARISON:  CT chest dated May 20, 2019. FINDINGS: Cardiovascular: Normal heart size status post CABG and AVR. No thoracic aortic aneurysm or dissection. Coronary, aortic arch, and branch vessel atherosclerotic vascular disease. No central pulmonary embolism. Unchanged right chest wall pacemaker and left chest wall port catheter. Mediastinum/Nodes: Progressive enlargement of the ill-defined mass associated with the esophagus measuring 4.5 x 3.1 x 5.9 cm, previously 3.3 x 2.6 x 4.1 cm. The mass now invades the posterior wall of the trachea (series 4, image 87; series 2, images 27-35). There is some new fluid signal and gas within the mass that may reflect necrosis. No enlarged mediastinal, hilar, or axillary lymph nodes. The thyroid gland is unremarkable. Lungs/Pleura: Centrilobular paraseptal emphysema again noted. Unchanged juxtapleural scarring in the posterior right upper lobe. Chronic mild central peribronchial thickening.  No focal consolidation, pleural effusion, or pneumothorax. Previously seen 10 mm nodular density in the left upper lobe has decreased in size, now measuring 6 mm (series 7, image 74). Unchanged 5 mm ground-glass nodule in the left upper lobe (series 7, image 63), stable since March 2017. Small calcified granuloma in the left upper lobe again noted. No new pulmonary nodule. Upper Abdomen: No acute abnormality. Unchanged cirrhosis. Unchanged bilateral nonobstructive renal calculi. Musculoskeletal: No chest wall abnormality. No acute or significant osseous findings. IMPRESSION: 1. Progressive enlargement of the necrotic mass associated with the upper esophagus, now measuring 4.5  x 3.1 x 5.9 cm, previously 3.3 x 2.6 x 4.1 cm, with new invasion of the posterior wall of the trachea. 2. Previously seen 10 mm nodular density in the left upper lobe has decreased in size, now measuring 6 mm, consistent with resolving infection or inflammation. 3. Unchanged cirrhosis. 4. Unchanged bilateral nonobstructive nephrolithiasis. 5. Aortic Atherosclerosis (ICD10-I70.0) and Emphysema (ICD10-J43.9). Electronically Signed   By: Titus Dubin M.D.   On: 07/19/2019 13:42   DG Chest Port 1 View  Result Date: 07/23/2019 CLINICAL DATA:  Hemoptysis. Personal history of esophageal carcinoma. EXAM: PORTABLE CHEST 1 VIEW COMPARISON:  07/19/2019 FINDINGS: The heart size and mediastinal contours are within normal limits. Prior CABG and aortic valve replacement again noted. Dual lead transvenous pacemaker remains in appropriate position. Left-sided power port also remains in expected position. Pulmonary hyperinflation again seen, consistent with COPD. No evidence of pulmonary infiltrate or edema. No evidence of pneumothorax or pleural effusion. IMPRESSION: Stable exam. COPD. No active disease. Electronically Signed   By: Marlaine Hind M.D.   On: 07/23/2019 10:52   DG Chest Port 1 View  Result Date: 07/19/2019 CLINICAL DATA:  Cough, history of  throat cancer EXAM: PORTABLE CHEST 1 VIEW COMPARISON:  12/15/2018 FINDINGS: Left chest wall port catheter tip overlies SVC. No new consolidation or edema. No pleural effusion or pneumothorax. Stable cardiomediastinal contours with normal heart size. Valve replacement and right chest wall dual lead pacemaker noted. IMPRESSION: No acute process in the chest. Electronically Signed   By: Macy Mis M.D.   On: 07/19/2019 12:11   DG Swallowing Func-Speech Pathology  Result Date: 07/21/2019 Objective Swallowing Evaluation: Type of Study: MBS-Modified Barium Swallow Study  Patient Details Name: [name]. MRN: [id number] Date of Birth: [birth year] Today's Date: 07/21/2019 Time: SLP Start Time (ACUTE ONLY): 1235 -SLP Stop Time (ACUTE ONLY): 1300 SLP Time Calculation (min) (ACUTE ONLY): 25 min Past Medical History: Past Medical History: Diagnosis Date . AAA (abdominal aortic aneurysm) ([place])   a. Last duplex - 3.8cm 01/2013 - due 01/2014. [name] Acne rosacea  . Alcoholic hepatitis with ascites 08/28/2017 . Anginal pain ([place]) 1996 . Aortic stenosis, mild   Last echo 05/04/12 +LVH . Arthritis   "shoulders" (09/01/2017) . Back pain   hx epidural injections . Baker's cyst   Lt. [name] CAD (coronary artery disease)   a. s/p MI/PTCA - Cx 1994. b. s/p CABG x5 in 1997. c. Abnl nuc 2003- occ SVG-RCA, patent seq SVG-OM1-dCxOM, patentl LIMA-LAD, patent, SVG-diag. d. Normal nuc 09/2012. . Carotid artery disease ([place])   a. Duplex 01/2013: mildly abnormal. Mild plaque without significant diameter reduction. Repeat recommended 11/15. [name] COPD (chronic obstructive pulmonary disease) ([place])  . Dizziness and giddiness 08/04/2013 . GERD (gastroesophageal reflux disease)  . H/O: GI bleed   mild, neg. colonoscopy . Heart murmur  . HOH (hard of hearing)  . HTN (hypertension)  . Hyperlipidemia  . Hypertensive heart disease  . Myocardial infarction ([place]) 1994 . OSA on CPAP  . OSA treated with BiPAP 11/20/2015  Severe with [facility] 38/hr . PAF (paroxysmal atrial  fibrillation) ([place])   a. Remote per records.  . Pneumonia   history . Presence of permanent cardiac pacemaker   DOI 1999 with change out 2006; St Jude . Prostate cancer ([place]) ~ 2010  a. s/p radical retropubic prostatectomy with bilateral pelvic lymph node dissection . PVD (peripheral vascular disease) ([place])   a. Rt ext iliac stenosis, last PV cath 2003, moderate stenosis last Lower Ext Dopplers 12/16/11 -  Rt. ABI 0.96  Lt ABI 1.0. . Sick sinus syndrome ([place])   a. placed 1999, gen change 2011 - St. Jude. . Sinus node dysfunction ([place]) 01/20/2013 . Syncope 11/28/97 . Thrombocytopenia ([facility])   chronic, mild . Valvular heart disease   a. Echo 04/2012: mild-mod AS, mild AI, mild MR. Past Surgical History: Past Surgical History: Procedure Laterality Date . APPENDECTOMY   . BIOPSY  10/20/2017  Procedure: BIOPSY;  Surgeon: Doran Stabler, MD;  Location: [facility];  Service: Gastroenterology;; . CARDIAC CATHETERIZATION  2003  VG to RCA occluded, other grafts patent . CARDIAC CATHETERIZATION N/A 05/04/2015  Procedure: Right/Left Heart Cath and Coronary/Graft Angiography;  Surgeon: Sherren Mocha, MD;  Location: [place] [facility];  Service: Cardiovascular;  Laterality: N/A; . CARDIAC CATHETERIZATION  09/01/2017 . CARDIAC VALVE REPLACEMENT   . CATARACT EXTRACTION W/ INTRAOCULAR LENS  IMPLANT, BILATERAL Bilateral  . COLONOSCOPY   . CORONARY ANGIOPLASTY  03/29/92  PTCA to LCX . CORONARY ARTERY BYPASS GRAFT  1997  LIMA-LAD; VG-diag; seq VG-1st OM & distal LCX; VG-RCA . CORONARY ATHERECTOMY N/A 09/01/2017  Procedure: CORONARY ATHERECTOMY;  Surgeon: Jettie Booze, MD;  Location: [place] [facility] LAB;  Service: Cardiovascular;  Laterality: N/A; . CORONARY ATHERECTOMY  10/01/2017 . CORONARY ATHERECTOMY N/A 10/01/2017  Procedure: CORONARY ATHERECTOMY;  Surgeon: Leonie Man, MD;  Location: [place] [facility];  Service: Cardiovascular;  Laterality: N/A; . ESOPHAGOGASTRODUODENOSCOPY (EGD) WITH PROPOFOL N/A 10/20/2017  Procedure:  ESOPHAGOGASTRODUODENOSCOPY (EGD) WITH PROPOFOL;  Surgeon: Doran Stabler, MD;  Location: [facility];  Service: Gastroenterology;  Laterality: N/A; . ESOPHAGOGASTRODUODENOSCOPY (EGD) WITH PROPOFOL N/A 03/29/2018  Procedure: ESOPHAGOGASTRODUODENOSCOPY (EGD) WITH PROPOFOL;  Surgeon: Doran Stabler, MD;  Location: [facility];  Service: Gastroenterology;  Laterality: N/A; . INSERT / REPLACE / REMOVE PACEMAKER  11/28/1997  pacesetter--ERI 2011 . IR IMAGING GUIDED PORT INSERTION  02/02/2019 . LEFT HEART CATH N/A 09/01/2017  Procedure: Left Heart Cath;  Surgeon: Jettie Booze, MD;  Location: [place] [facility] LAB;  Service: Cardiovascular;  Laterality: N/A; . LEFT HEART CATH AND CORS/GRAFTS ANGIOGRAPHY N/A 08/28/2017  Procedure: LEFT HEART CATH AND CORS/GRAFTS ANGIOGRAPHY;  Surgeon: Leonie Man, MD;  Location: [place] [facility];  Service: Cardiovascular;  Laterality: N/A; . PACEMAKER GENERATOR CHANGE  08/15/2009  St. Jude accent . SUPRAPUBIC PROSTATECTOMY   . TEE WITHOUT CARDIOVERSION N/A 06/12/2015  Procedure: TRANSESOPHAGEAL ECHOCARDIOGRAM (TEE);  Surgeon: Sherren Mocha, MD;  Location: [place];  Service: Open Heart Surgery;  Laterality: N/A; . TONSILLECTOMY   . TRANSCATHETER AORTIC VALVE REPLACEMENT, TRANSFEMORAL N/A 06/12/2015  Procedure: TRANSCATHETER AORTIC VALVE REPLACEMENT, TRANSFEMORAL, possible transpical;  Surgeon: Sherren Mocha, MD;  Location: [place];  Service: Open Heart Surgery;  Laterality: N/A; HPI: Patient is 85 year old male with COPD, CAD, GERD, paroxysmal A. fib, pacemaker, PVD, history of prostate CA, esophageal CA presented with worsening hemoptysis and hematemesis.  No fevers or chills.  Has hard time eating, choking sensation and increasing dysphagia  Subjective: Pt reports coughing with all PO intake (liquids and solids) Assessment / Plan / Recommendation CHL IP CLINICAL IMPRESSIONS 07/21/2019 Clinical Impression Pt was seen for a modified barium swallow study with PMH of esophageal  cancer s/p XRT with recurrence/extention. 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 Pt demonstrated strong cough response t/o study, which was not correlative with penetration/aspiration instances. Pt demonstrates remarkable oropharyngeal swallow function for his advanced age and medical history. Oral phase was grossly WFL. Regular  solids were not assessed, as his dentures were not available at the time. With soft solids, pt had good oral clearance and lingual control. Pharyngeally, pt demonstrates reduced laryngeal elevation, hyoid excursion, and epiglottic inversion, which ultimately resulted in trace penetration of thin liquids prior to the swallow and mild residue in valleculae with purees/solids after the swallow. Residue was improved with liquid wash/repeat swallow. Given sequential straw or cup sips, pt had stagnant trace penetration, so he is recommended to take single sips. Esophageal phase showed very mild retrograde bolus movement with thin liquids. At this time, pt is recommended for thin liquids and mechanical soft solids (regular once dentures are available), alternating liquids/solids, single sips. Of note, pt has plans to initiate upper esophageal XRT, which is very likely to impact swallow function. He should follow up with OP SLP regarding PO intake r/t this treatment. SLP Visit Diagnosis Dysphagia, oropharyngeal phase (R13.12) Impact on safety and function Moderate aspiration risk;Mild aspiration risk     Prognosis 07/21/2019 Prognosis for Safe Diet Advancement Guarded Barriers to Reach Goals Other (Comment) Barriers/Prognosis Comment guarded d/t advanced age and medical dx CHL IP DIET RECOMMENDATION 07/21/2019 SLP Diet Recommendations Thin liquid;Dysphagia 3 (Mech soft) solids Liquid Administration via Straw;Cup Medication Administration Crushed with puree Compensations Small sips/bites;Follow solids with liquid   CHL IP OTHER RECOMMENDATIONS 07/21/2019  Recommended Consults ENT appt next week; recommend SLP as OP as well Oral Care Recommendations Oral care BID   CHL IP FOLLOW UP RECOMMENDATIONS 07/21/2019 Follow up Recommendations Outpatient SLP   No flowsheet data found.     CHL IP ORAL PHASE 07/21/2019 Oral Phase WFL Oral - Regular Deferred d/t dentures being upstairs  CHL IP PHARYNGEAL PHASE 07/21/2019 Pharyngeal Phase Impaired Pharyngeal- Thin Cup Reduced epiglottic inversion;Reduced laryngeal elevation;Reduced anterior laryngeal mobility;Penetration/Aspiration before swallow Pharyngeal Material enters airway, remains ABOVE vocal cords then ejected out Pharyngeal- Thin Straw Penetration/Aspiration before swallow;Reduced laryngeal elevation;Reduced airway/laryngeal closure;Reduced anterior laryngeal mobility;Reduced epiglottic inversion Pharyngeal Material enters airway, remains ABOVE vocal cords then ejected out;Material enters airway, remains ABOVE vocal cords and not ejected out Pharyngeal- Puree Reduced pharyngeal peristalsis;Reduced epiglottic inversion;Pharyngeal residue - valleculae Pharyngeal Material does not enter airway Pharyngeal- Mechanical Soft WFL;Reduced pharyngeal peristalsis;Pharyngeal residue - valleculae  CHL IP CERVICAL ESOPHAGEAL PHASE 07/21/2019 Cervical Esophageal Phase [employer] [place] P. Isenhour, M.S., CCC-SLP Speech-Language Pathologist Acute Rehabilitation Services Pager: [place] 07/21/2019, 1:59 PM                Lab Data:  CBC: Recent Labs  Lab 07/19/19 1048 07/19/19 1048 07/19/19 1815 07/20/19 0611 07/20/19 2051 07/22/19 0539 07/23/19 0617  WBC 3.6*  --  2.9* 3.0*  --  3.9* 2.5*  HGB 8.4*   < > 8.0* 7.7* 8.8* 9.3* 5.3*  HCT 27.0*   < > 25.5* 25.0* 27.1* 29.3* 17.7*  MCV 93.4  --  93.4 93.3  --  93.0 97.3  PLT 117*  --  100* 93*  --  91* 51*   < > = values in this interval not displayed.   Basic Metabolic Panel: Recent Labs  Lab 07/19/19 1048 07/20/19 0611 07/22/19 0539  NA 138 139 138  K 3.7  3.8 3.8  CL 107 107 105  CO2 22 24 25   GLUCOSE 105* 88 92  BUN 18 15 15   CREATININE 0.96 0.95 0.92  CALCIUM 8.7* 8.4* 8.3*   GFR: Estimated Creatinine Clearance: 56.9 mL/min (by C-G formula based on SCr of 0.92 mg/dL). Liver Function Tests: Recent Labs  Lab  07/19/19 1048  AST 27  ALT 15  ALKPHOS 192*  BILITOT 1.1  PROT 7.1  ALBUMIN 2.9*   No results for input(s): LIPASE, AMYLASE in the last 168 hours. No results for input(s): AMMONIA in the last 168 hours. Coagulation Profile: Recent Labs  Lab 07/19/19 1053  INR 1.2   Cardiac Enzymes: No results for input(s): CKTOTAL, CKMB, CKMBINDEX, TROPONINI in the last 168 hours. BNP (last 3 results) No results for input(s): PROBNP in the last 8760 hours. HbA1C: No results for input(s): HGBA1C in the last 72 hours. CBG: No results for input(s): GLUCAP in the last 168 hours. Lipid Profile: No results for input(s): CHOL, HDL, LDLCALC, TRIG, CHOLHDL, LDLDIRECT in the last 72 hours. Thyroid Function Tests: No results for input(s): TSH, T4TOTAL, FREET4, T3FREE, THYROIDAB in the last 72 hours. Anemia Panel: No results for input(s): VITAMINB12, FOLATE, FERRITIN, TIBC, IRON, RETICCTPCT in the last 72 hours. Urine analysis:    Component Value Date/Time   COLORURINE YELLOW 06/08/2015 1056   APPEARANCEUR CLEAR 06/08/2015 1056   LABSPEC 1.013 06/08/2015 1056   PHURINE 7.5 06/08/2015 1056   GLUCOSEU NEGATIVE 06/08/2015 1056   HGBUR TRACE (A) 06/08/2015 1056   BILIRUBINUR NEGATIVE 06/08/2015 1056   KETONESUR NEGATIVE 06/08/2015 1056   PROTEINUR NEGATIVE 06/08/2015 1056   UROBILINOGEN 0.2 09/26/2009 1906   NITRITE NEGATIVE 06/08/2015 [place] 06/08/2015 1056     Conya Ellinwood M.D. [facility] 07/23/2019, 11:33 AM   Call night coverage person covering after 7pm

## 2019-07-24 LAB — CBC
HCT: 32.3 % — ABNORMAL LOW (ref 39.0–52.0)
Hemoglobin: 10.4 g/dL — ABNORMAL LOW (ref 13.0–17.0)
MCH: 29.6 pg (ref 26.0–34.0)
MCHC: 32.2 g/dL (ref 30.0–36.0)
MCV: 92 fL (ref 80.0–100.0)
Platelets: 70 10*3/uL — ABNORMAL LOW (ref 150–400)
RBC: 3.51 MIL/uL — ABNORMAL LOW (ref 4.22–5.81)
RDW: 15.2 % (ref 11.5–15.5)
WBC: 2.7 10*3/uL — ABNORMAL LOW (ref 4.0–10.5)
nRBC: 0 % (ref 0.0–0.2)

## 2019-07-24 LAB — BPAM RBC
Blood Product Expiration Date: 202105292359
Blood Product Expiration Date: 202105292359
ISSUE DATE / TIME: 202105081214
ISSUE DATE / TIME: 202105081609
Unit Type and Rh: 6200
Unit Type and Rh: 6200

## 2019-07-24 LAB — TYPE AND SCREEN
ABO/RH(D): A POS
Antibody Screen: NEGATIVE
Unit division: 0
Unit division: 0

## 2019-07-24 MED ORDER — PANTOPRAZOLE SODIUM 40 MG PO TBEC: 40.0000 mg | DELAYED_RELEASE_TABLET | Freq: Two times a day (BID) | ORAL | Status: DC

## 2019-07-24 MED ORDER — MORPHINE SULFATE (PF) 2 MG/ML IV SOLN
2.0000 mg | INTRAVENOUS | Status: DC | PRN
  Administered 2019-07-25 – 2019-07-27 (×3): 2 mg via INTRAVENOUS
  Filled 2019-07-24 (×4): qty 1

## 2019-07-24 MED ORDER — ACETAMINOPHEN 325 MG PO TABS
650.0000 mg | ORAL_TABLET | ORAL | Status: DC | PRN
  Administered 2019-07-26 – 2019-07-27 (×2): 650 mg via ORAL
  Filled 2019-07-24 (×2): qty 2

## 2019-07-24 MED ORDER — FLUCONAZOLE 100MG IVPB
100.0000 mg | INTRAVENOUS | Status: DC
  Administered 2019-07-24 – 2019-07-27 (×4): 100 mg via INTRAVENOUS
  Filled 2019-07-24 (×4): qty 50

## 2019-07-24 MED ORDER — FLUCONAZOLE 100 MG PO TABS: 100.0000 mg | ORAL_TABLET | Freq: Every day | ORAL | Status: DC

## 2019-07-24 MED ORDER — NYSTATIN 100000 UNIT/ML MT SUSP
5.0000 mL | Freq: Four times a day (QID) | OROMUCOSAL | Status: DC
  Administered 2019-07-24 (×2): 500000 [IU] via ORAL
  Filled 2019-07-24 (×5): qty 5

## 2019-07-24 MED ORDER — PANTOPRAZOLE SODIUM 40 MG IV SOLR
40.0000 mg | Freq: Two times a day (BID) | INTRAVENOUS | Status: DC
  Administered 2019-07-24 – 2019-07-27 (×6): 40 mg via INTRAVENOUS
  Filled 2019-07-24 (×6): qty 40

## 2019-07-24 NOTE — Progress Notes (Signed)
Pt. Continues to refuse CPAP.  Will be available if he changes his mind.

## 2019-07-24 NOTE — Progress Notes (Addendum)
[facility]                                                                              Patient Demographics  [name], is a 85 y.o. male, DOB - 11-02-1934, [id number]  Admit date - 07/19/2019   Admitting Physician Curtis Reeve, DO  Outpatient Primary MD for the patient is Curtis Downing, MD  Outpatient specialists:   LOS - 4  days   Medical records reviewed and are as summarized below:    Chief Complaint  Patient presents with  . Hemoptysis       Brief summary   Patient is 85 year old male with COPD, CAD, GERD, paroxysmal A. fib, pacemaker, PVD, history of prostate CA, esophageal CA presented with worsening hemoptysis and hematemesis.  No fevers or chills.  Has hard time eating, choking sensation and increasing dysphagia. Feels dizzy with ambulation. Per ED MD, oncology was consulted and recommended XRT  Assessment & Plan    Esophageal malignancy with hemoptysis and hematemesis Acute on chronic anemia, possibly chemotherapy-induced pancytopenia -Patient has history of locally recurrent esophageal cancer, currently on nivolumab, following Dr. Benay Sims now symptomatic with dysphagia and hemoptysis -CT showed enlargement of the upper esophageal mass with evidence of tracheal invasion. -Per oncology, systemic treatment options are limited now as he has progressed following the FOLFOX and nivolumab, recommending palliative radiation, doubt hemoptysis will be amenable to endoscopic therapy or embolization. -Per GI, no role of endoscopy at this time and recommended oncology and XRT -Radiation oncology consulted, CT simulation on 5/6, rec'd 5 weeks of definitive XRT starting 5/10  -Patient has outpatient follow-up with ENT at [facility] next week -Status post 1 unit packed RBCs on 5/5, 2 units packed RBC on 5/8 -H&H stable, platelets improving -Patient starting XRT tomorrow.  States that he will go home after the first radiation treatment  tomorrow, does not feel comfortable going home before it.  He will be able to arrange transportation with his family starting 5/11 on Tuesday for the radiation treatment -Does not like full liquid diet, will advance to dysphagia 3 diet per SLP Addendum 2:01pm Notified by RN, patient having difficulty with pills -Hold oral pills.  Changed Protonix to IV, continue IV metoprolol, IV fluconazole, -Added IV morphine 2 mg every 3 hours as needed for severe pain   Active Problems: Oral lesions, mucositis -Placed on nystatin S&S, fluconazole for 7 days, states lidocaine solution help  CAD status post CABG, history of paroxysmal A. fib, history of aortic stenosis, pacemaker, AAA -Currently normal sinus rhythm -BP currently stable, continue beta-blocker, hold off on losartan    OSA -CPAP if able to tolerate, refusing this time  GERD Continue PPI  Fevers/possible developing aspiration PNA -Patient was spiking low-grade fevers, rhonchorous lung sounds in the last 2 days -Continue IV Zosyn today, transition to oral Augmentin at the time of discharge -Chest x-ray with no active disease  Generalized debility Patient reports that he is feeling weak, will have PT OT evaluation for assessment   Code Status: Currently full CODE STATUS DVT Prophylaxis:  SCD's Family Communication: Discussed all imaging results, lab results, explained to the patient  and family member, [name] on phone   Disposition Plan:     Status is: Inpatient  The patient will require care spanning > 2 midnights and should be moved to inpatient because: Ongoing diagnostic testing needed not appropriate for outpatient work up.  Starting XRT tomorrow, patient wants to go home after the radiation treatment.  States he is too weak and does not feel comfortable.   Dispo: The patient is from: Home              Anticipated d/c is to: Home              Anticipated d/c date is: 2 days              Patient currently is not  medically stable to d/c.      Time Spent in minutes   35 minutes  Procedures:  None  Consultants:   GI Oncology Radiation oncology  Antimicrobials:   Anti-infectives (From admission, onward)   Start     Dose/Rate Route Frequency Ordered Stop   07/22/19 1500  piperacillin-tazobactam (ZOSYN) IVPB 3.375 g     3.375 g 12.5 mL/hr over 240 Minutes Intravenous Every 8 hours 07/22/19 1445           Medications  Scheduled Meds: . acetaminophen  650 mg Oral BID  . atorvastatin  40 mg Oral q1800  . Chlorhexidine Gluconate Cloth  6 each Topical Daily  . feeding supplement (ENSURE ENLIVE)  237 mL Oral BID BM  . lidocaine  15 mL Mouth/Throat TID AC & HS  . losartan  25 mg Oral QHS  . metoprolol tartrate  2.5 mg Intravenous Q6H  . pantoprazole (PROTONIX) IV  40 mg Intravenous Q12H   Continuous Infusions: . piperacillin-tazobactam (ZOSYN)  IV 3.375 g (07/24/19 1040)   PRN Meds:.HYDROcodone-acetaminophen, magnesium hydroxide, prochlorperazine, Resource ThickenUp Clear, senna      Subjective:   [name] was seen and examined today.  No fevers this morning, lungs are sounding better.  Still having intermittent hemoptysis.  States does not like full liquid diet, wants to try grits and oatmeal.  H&H stable.  States he is feeling weak today and hurts in the mouth when he is eating.  Lidocaine solution is helping.  Objective:   Vitals:   07/23/19 1628 07/23/19 1922 07/23/19 2126 07/24/19 0608  BP: (!) 109/55 127/70 116/70 119/66  Pulse: 79 88 (!) 108 77  Resp: 19 18 20 18   Temp: 99.5 F (37.5 C) 99.5 F (37.5 C) 99 F (37.2 C) 99.2 F (37.3 C)  TempSrc: Oral Oral Oral Oral  SpO2: 90% 90% 92% 95%  Weight:      Height:        Intake/Output Summary (Last 24 hours) at 07/24/2019 1054 Last data filed at 07/23/2019 1902 Gross per 24 hour  Intake 765.91 ml  Output --  Net 765.91 ml     Wt Readings from Last 3 Encounters:  07/19/19 77 kg  07/07/19 77.9 kg  06/24/19  78.1 kg   Physical Exam  General: Alert and oriented x 3, NAD, hoarseness of voice  Cardiovascular: S1 S2 clear, RRR. No pedal edema b/l  Respiratory: Bilateral scattered rhonchi  Gastrointestinal: Soft, nontender, nondistended, NBS  Ext: no pedal edema bilaterally  Neuro: no new deficits  Musculoskeletal: No cyanosis, clubbing  Skin: No rashes  Psych: Normal affect and demeanor, alert and oriented x3     Data Reviewed:  I have personally reviewed following labs and  imaging studies  Micro Results Recent Results (from the past 240 hour(s))  Respiratory Panel by RT PCR (Flu A&B, Covid) - Nasopharyngeal Swab     Status: None   Collection Time: 07/19/19 10:54 AM   Specimen: Nasopharyngeal Swab  Result Value Ref Range Status   SARS Coronavirus 2 by RT PCR NEGATIVE NEGATIVE Final    Comment: (NOTE) SARS-CoV-2 target nucleic acids are NOT DETECTED. The SARS-CoV-2 RNA is generally detectable in upper respiratoy specimens during the acute phase of infection. The lowest concentration of SARS-CoV-2 viral copies this assay can detect is 131 copies/mL. A negative result does not preclude SARS-Cov-2 infection and should not be used as the sole basis for treatment or other patient management decisions. A negative result may occur with  improper specimen collection/handling, submission of specimen other than nasopharyngeal swab, presence of viral mutation(s) within the areas targeted by this assay, and inadequate number of viral copies (<131 copies/mL). A negative result must be combined with clinical observations, patient history, and epidemiological information. The expected result is Negative. Fact Sheet for Patients:  PinkCheek.be Fact Sheet for Healthcare Providers:  GravelBags.it This test is not yet ap proved or cleared by the [place] FDA and  has been authorized for detection and/or diagnosis of SARS-CoV-2  by FDA under an Emergency Use Authorization (EUA). This EUA will remain  in effect (meaning this test can be used) for the duration of the COVID-19 declaration under Section 564(b)(1) of the Act, 21 U.S.C. section 360bbb-3(b)(1), unless the authorization is terminated or revoked sooner.    Influenza A by PCR NEGATIVE NEGATIVE Final   Influenza B by PCR NEGATIVE NEGATIVE Final    Comment: (NOTE) The Xpert Xpress SARS-CoV-2/FLU/RSV assay is intended as an aid in  the diagnosis of influenza from Nasopharyngeal swab specimens and  should not be used as a sole basis for treatment. Nasal washings and  aspirates are unacceptable for Xpert Xpress SARS-CoV-2/FLU/RSV  testing. Fact Sheet for Patients: PinkCheek.be Fact Sheet for Healthcare Providers: GravelBags.it This test is not yet approved or cleared by the [place] FDA and  has been authorized for detection and/or diagnosis of SARS-CoV-2 by  FDA under an Emergency Use Authorization (EUA). This EUA will remain  in effect (meaning this test can be used) for the duration of the  Covid-19 declaration under Section 564(b)(1) of the Act, 21  U.S.C. section 360bbb-3(b)(1), unless the authorization is  terminated or revoked. Performed at [facility], [place] [address], [place], [place] [address]   Culture, blood (routine x 2)     Status: None (Preliminary result)   Collection Time: 07/23/19  8:05 AM   Specimen: BLOOD  Result Value Ref Range Status   Specimen Description   Final    BLOOD RIGHT ARM Performed at [place] [address], [place], [place] [address]    Special Requests   Final    BOTTLES DRAWN AEROBIC AND ANAEROBIC Blood Culture adequate volume Performed at [place] [address] [place], [place] [address]    Culture   Final    NO GROWTH < 24 HOURS Performed at [place] [address], [place], [place] [address]    Report Status PENDING  Incomplete  Culture, blood (routine x 2)     Status: None (Preliminary result)   Collection Time: 07/23/19  8:10 AM   Specimen: BLOOD  Result Value Ref Range Status   Specimen Description   Final  BLOOD LEFT ARM Performed at [facility], [place] [address], [place], [place] [address]    Special Requests   Final    BOTTLES DRAWN AEROBIC AND ANAEROBIC Blood Culture adequate volume Performed at [place] [address], [place], [place] [address]    Culture   Final    NO GROWTH < 24 HOURS Performed at [place] [address] [place], [place] [address]    Report Status PENDING  Incomplete    Radiology Reports CT Chest W Contrast  Result Date: 07/19/2019 CLINICAL DATA:  Worsening hemoptysis over the past 2 weeks. History of esophageal cancer. EXAM: CT CHEST WITH CONTRAST TECHNIQUE: Multidetector CT imaging of the chest was performed during intravenous contrast administration. CONTRAST:  4mL OMNIPAQUE IOHEXOL 300 MG/ML  SOLN COMPARISON:  CT chest dated May 20, 2019. FINDINGS: Cardiovascular: Normal heart size status post CABG and AVR. No thoracic aortic aneurysm or dissection. Coronary, aortic arch, and branch vessel atherosclerotic vascular disease. No central pulmonary embolism. Unchanged right chest wall pacemaker and left chest wall port catheter. Mediastinum/Nodes: Progressive enlargement of the ill-defined mass associated with the esophagus measuring 4.5 x 3.1 x 5.9 cm, previously 3.3 x 2.6 x 4.1 cm. The mass now invades the posterior wall of the trachea (series 4, image 87; series 2, images 27-35). There is some new fluid signal and gas within the mass that may reflect necrosis. No enlarged mediastinal, hilar, or axillary lymph nodes. The thyroid gland is unremarkable. Lungs/Pleura: Centrilobular paraseptal emphysema again noted. Unchanged juxtapleural scarring in the posterior right  upper lobe. Chronic mild central peribronchial thickening. No focal consolidation, pleural effusion, or pneumothorax. Previously seen 10 mm nodular density in the left upper lobe has decreased in size, now measuring 6 mm (series 7, image 74). Unchanged 5 mm ground-glass nodule in the left upper lobe (series 7, image 63), stable since March 2017. Small calcified granuloma in the left upper lobe again noted. No new pulmonary nodule. Upper Abdomen: No acute abnormality. Unchanged cirrhosis. Unchanged bilateral nonobstructive renal calculi. Musculoskeletal: No chest wall abnormality. No acute or significant osseous findings. IMPRESSION: 1. Progressive enlargement of the necrotic mass associated with the upper esophagus, now measuring 4.5 x 3.1 x 5.9 cm, previously 3.3 x 2.6 x 4.1 cm, with new invasion of the posterior wall of the trachea. 2. Previously seen 10 mm nodular density in the left upper lobe has decreased in size, now measuring 6 mm, consistent with resolving infection or inflammation. 3. Unchanged cirrhosis. 4. Unchanged bilateral nonobstructive nephrolithiasis. 5. Aortic Atherosclerosis (ICD10-I70.0) and Emphysema (ICD10-J43.9). Electronically Signed   By: Titus Dubin M.D.   On: 07/19/2019 13:42   DG Chest Port 1 View  Result Date: 07/23/2019 CLINICAL DATA:  Hemoptysis. Personal history of esophageal carcinoma. EXAM: PORTABLE CHEST 1 VIEW COMPARISON:  07/19/2019 FINDINGS: The heart size and mediastinal contours are within normal limits. Prior CABG and aortic valve replacement again noted. Dual lead transvenous pacemaker remains in appropriate position. Left-sided power port also remains in expected position. Pulmonary hyperinflation again seen, consistent with COPD. No evidence of pulmonary infiltrate or edema. No evidence of pneumothorax or pleural effusion. IMPRESSION: Stable exam. COPD. No active disease. Electronically Signed   By: Marlaine Hind M.D.   On: 07/23/2019 10:52   DG Chest Port 1  View  Result Date: 07/19/2019 CLINICAL DATA:  Cough, history of throat cancer EXAM: PORTABLE CHEST 1 VIEW COMPARISON:  12/15/2018 FINDINGS: Left chest wall port catheter tip overlies SVC. No  new consolidation or edema. No pleural effusion or pneumothorax. Stable cardiomediastinal contours with normal heart size. Valve replacement and right chest wall dual lead pacemaker noted. IMPRESSION: No acute process in the chest. Electronically Signed   By: Macy Mis M.D.   On: 07/19/2019 12:11   DG Swallowing Func-Speech Pathology  Result Date: 07/21/2019 Objective Swallowing Evaluation: Type of Study: MBS-Modified Barium Swallow Study  Patient Details Name: Ekam Derstine. MRN: [id number] Date of Birth: [birth year] Today's Date: 07/21/2019 Time: SLP Start Time (ACUTE ONLY): 1235 -SLP Stop Time (ACUTE ONLY): 1300 SLP Time Calculation (min) (ACUTE ONLY): 25 min Past Medical History: Past Medical History: Diagnosis Date . AAA (abdominal aortic aneurysm) ([place])   a. Last duplex - 3.8cm 01/2013 - due 01/2014. [name] Acne rosacea  . Alcoholic hepatitis with ascites 08/28/2017 . Anginal pain ([place]) 1996 . Aortic stenosis, mild   Last echo 05/04/12 +LVH . Arthritis   "shoulders" (09/01/2017) . Back pain   hx epidural injections . Baker's cyst   Lt. [name] CAD (coronary artery disease)   a. s/p MI/PTCA - Cx 1994. b. s/p CABG x5 in 1997. c. Abnl nuc 2003- occ SVG-RCA, patent seq SVG-OM1-dCxOM, patentl LIMA-LAD, patent, SVG-diag. d. Normal nuc 09/2012. . Carotid artery disease ([place])   a. Duplex 01/2013: mildly abnormal. Mild plaque without significant diameter reduction. Repeat recommended 11/15. [name] COPD (chronic obstructive pulmonary disease) ([place])  . Dizziness and giddiness 08/04/2013 . GERD (gastroesophageal reflux disease)  . H/O: GI bleed   mild, neg. colonoscopy . Heart murmur  . HOH (hard of hearing)  . HTN (hypertension)  . Hyperlipidemia  . Hypertensive heart disease  . Myocardial infarction ([place]) 1994 . OSA on CPAP  . OSA treated with  BiPAP 11/20/2015  Severe with [facility] 38/hr . PAF (paroxysmal atrial fibrillation) ([place])   a. Remote per records.  . Pneumonia   history . Presence of permanent cardiac pacemaker   DOI 1999 with change out 2006; St Jude . Prostate cancer ([place]) ~ 2010  a. s/p radical retropubic prostatectomy with bilateral pelvic lymph node dissection . PVD (peripheral vascular disease) ([place])   a. Rt ext iliac stenosis, last PV cath 2003, moderate stenosis last Lower Ext Dopplers 12/16/11 - Rt. ABI 0.96  Lt ABI 1.0. . Sick sinus syndrome ([place])   a. placed 1999, gen change 2011 - St. Jude. . Sinus node dysfunction ([place]) 01/20/2013 . Syncope 11/28/97 . Thrombocytopenia ([facility])   chronic, mild . Valvular heart disease   a. Echo 04/2012: mild-mod AS, mild AI, mild MR. Past Surgical History: Past Surgical History: Procedure Laterality Date . APPENDECTOMY   . BIOPSY  10/20/2017  Procedure: BIOPSY;  Surgeon: Doran Stabler, MD;  Location: [facility];  Service: Gastroenterology;; . CARDIAC CATHETERIZATION  2003  VG to RCA occluded, other grafts patent . CARDIAC CATHETERIZATION N/A 05/04/2015  Procedure: Right/Left Heart Cath and Coronary/Graft Angiography;  Surgeon: Sherren Mocha, MD;  Location: [place] [facility];  Service: Cardiovascular;  Laterality: N/A; . CARDIAC CATHETERIZATION  09/01/2017 . CARDIAC VALVE REPLACEMENT   . CATARACT EXTRACTION W/ INTRAOCULAR LENS  IMPLANT, BILATERAL Bilateral  . COLONOSCOPY   . CORONARY ANGIOPLASTY  03/29/92  PTCA to LCX . CORONARY ARTERY BYPASS GRAFT  1997  LIMA-LAD; VG-diag; seq VG-1st OM & distal LCX; VG-RCA . CORONARY ATHERECTOMY N/A 09/01/2017  Procedure: CORONARY ATHERECTOMY;  Surgeon: Jettie Booze, MD;  Location: [place] [facility];  Service: Cardiovascular;  Laterality: N/A; . CORONARY ATHERECTOMY  10/01/2017 . CORONARY ATHERECTOMY N/A 10/01/2017  Procedure: CORONARY ATHERECTOMY;  Surgeon: Leonie Man, MD;  Location: [place] [facility];  Service: Cardiovascular;  Laterality: N/A; .  ESOPHAGOGASTRODUODENOSCOPY (EGD) WITH PROPOFOL N/A 10/20/2017  Procedure: ESOPHAGOGASTRODUODENOSCOPY (EGD) WITH PROPOFOL;  Surgeon: Doran Stabler, MD;  [facility];  Service: Gastroenterology;  Laterality: N/A; . ESOPHAGOGASTRODUODENOSCOPY (EGD) WITH PROPOFOL N/A 03/29/2018  Procedure: ESOPHAGOGASTRODUODENOSCOPY (EGD) WITH PROPOFOL;  Surgeon: Doran Stabler, MD;  [facility];  Service: Gastroenterology;  Laterality: N/A; . INSERT / REPLACE / REMOVE PACEMAKER  11/28/1997  pacesetter--ERI 2011 . IR IMAGING GUIDED PORT INSERTION  02/02/2019 . LEFT HEART CATH N/A 09/01/2017  Procedure: Left Heart Cath;  Surgeon: Jettie Booze, MD;  Location: [place] [facility];  Service: Cardiovascular;  Laterality: N/A; . LEFT HEART CATH AND CORS/GRAFTS ANGIOGRAPHY N/A 08/28/2017  Procedure: LEFT HEART CATH AND CORS/GRAFTS ANGIOGRAPHY;  Surgeon: Leonie Man, MD;  Location: [place] [facility];  Service: Cardiovascular;  Laterality: N/A; . PACEMAKER GENERATOR CHANGE  08/15/2009  St. Jude accent . SUPRAPUBIC PROSTATECTOMY   . TEE WITHOUT CARDIOVERSION N/A 06/12/2015  Procedure: TRANSESOPHAGEAL ECHOCARDIOGRAM (TEE);  Surgeon: Sherren Mocha, MD;  Location: [place];  Service: Open Heart Surgery;  Laterality: N/A; . TONSILLECTOMY   . TRANSCATHETER AORTIC VALVE REPLACEMENT, TRANSFEMORAL N/A 06/12/2015  Procedure: TRANSCATHETER AORTIC VALVE REPLACEMENT, TRANSFEMORAL, possible transpical;  Surgeon: Sherren Mocha, MD;  Location: [place];  Service: Open Heart Surgery;  Laterality: N/A; HPI: Patient is 85 year old male with COPD, CAD, GERD, paroxysmal A. fib, pacemaker, PVD, history of prostate CA, esophageal CA presented with worsening hemoptysis and hematemesis.  No fevers or chills.  Has hard time eating, choking sensation and increasing dysphagia  Subjective: Pt reports coughing with all PO intake (liquids and solids) Assessment / Plan / Recommendation [facility] IP CLINICAL IMPRESSIONS 07/21/2019 Clinical Impression Pt  was seen for a modified barium swallow study with PMH of esophageal cancer s/p XRT with recurrence/extention. 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 Pt demonstrated strong cough response t/o study, which was not correlative with penetration/aspiration instances. Pt demonstrates remarkable oropharyngeal swallow function for his advanced age and medical history. Oral phase was grossly WFL. Regular solids were not assessed, as his dentures were not available at the time. With soft solids, pt had good oral clearance and lingual control. Pharyngeally, pt demonstrates reduced laryngeal elevation, hyoid excursion, and epiglottic inversion, which ultimately resulted in trace penetration of thin liquids prior to the swallow and mild residue in valleculae with purees/solids after the swallow. Residue was improved with liquid wash/repeat swallow. Given sequential straw or cup sips, pt had stagnant trace penetration, so he is recommended to take single sips. Esophageal phase showed very mild retrograde bolus movement with thin liquids. At this time, pt is recommended for thin liquids and mechanical soft solids (regular once dentures are available), alternating liquids/solids, single sips. Of note, pt has plans to initiate upper esophageal XRT, which is very likely to impact swallow function. He should follow up with OP SLP regarding PO intake r/t this treatment. SLP Visit Diagnosis Dysphagia, oropharyngeal phase (R13.12) Impact on safety and function Moderate aspiration risk;Mild aspiration risk     Prognosis 07/21/2019 Prognosis for Safe Diet Advancement Guarded Barriers to Reach Goals Other (Comment) Barriers/Prognosis Comment guarded d/t advanced age and medical dx CHL IP DIET RECOMMENDATION 07/21/2019 SLP Diet Recommendations Thin liquid;Dysphagia 3 (Mech soft) solids Liquid Administration via Straw;Cup Medication Administration Crushed with puree Compensations Small  sips/bites;Follow solids with  liquid   [facility] IP OTHER RECOMMENDATIONS 07/21/2019 Recommended Consults ENT appt next week; recommend SLP as OP as well Oral Care Recommendations Oral care BID   CH[facility] IP FOLLOW UP RECOMMENDATIONS 07/21/2019 Follow up Recommendations Outpatient SLP   No flowsheet data found.     CHL IP ORAL PHASE 07/21/2019 Oral Phase WFL Oral - Regular Deferred d/t dentures being upstairs  CHL IP PHARYNGEAL PHASE 07/21/2019 Pharyngeal Phase Impaired Pharyngeal- Thin Cup Reduced epiglottic inversion;Reduced laryngeal elevation;Reduced anterior laryngeal mobility;Penetration/Aspiration before swallow Pharyngeal Material enters airway, remains ABOVE vocal cords then ejected out Pharyngeal- Thin Straw Penetration/Aspiration before swallow;Reduced laryngeal elevation;Reduced airway/laryngeal closure;Reduced anterior laryngeal mobility;Reduced epiglottic inversion Pharyngeal Material enters airway, remains ABOVE vocal cords then ejected out;Material enters airway, remains ABOVE vocal cords and not ejected out Pharyngeal- Puree Reduced pharyngeal peristalsis;Reduced epiglottic inversion;Pharyngeal residue - valleculae Pharyngeal Material does not enter airway Pharyngeal- Mechanical Soft WFL;Reduced pharyngeal peristalsis;Pharyngeal residue - valleculae  CHL IP CERVICAL ESOPHAGEAL PHASE 07/21/2019 Cervical Esophageal Phase [facility] [place] P. Isenhour, M.S., CCC-SLP Speech-Language Pathologist Acute Rehabilitation Services Pager: [place] 07/21/2019, 1:59 PM                Lab Data:  CBC: Recent Labs  Lab 07/19/19 1815 07/19/19 1815 07/20/19 0611 07/20/19 2051 07/22/19 0539 07/23/19 0617 07/24/19 0551  WBC 2.9*  --  3.0*  --  3.9* 2.5* 2.7*  HGB 8.0*   < > 7.7* 8.8* 9.3* 5.3* 10.4*  HCT 25.5*   < > 25.0* 27.1* 29.3* 17.7* 32.3*  MCV 93.4  --  93.3  --  93.0 97.3 92.0  PLT 100*  --  93*  --  91* 51* 70*   < > = values in this interval not displayed.   Basic Metabolic Panel: Recent Labs    Lab 07/19/19 1048 07/20/19 0611 07/22/19 0539  NA 138 139 138  K 3.7 3.8 3.8  CL 107 107 105  CO2 22 24 25   GLUCOSE 105* 88 92  BUN 18 15 15   CREATININE 0.96 0.95 0.92  CALCIUM 8.7* 8.4* 8.3*   GFR: Estimated Creatinine Clearance: 56.9 mL/min (by C-G formula based on SCr of 0.92 mg/dL). Liver Function Tests: Recent Labs  Lab 07/19/19 1048  AST 27  ALT 15  ALKPHOS 192*  BILITOT 1.1  PROT 7.1  ALBUMIN 2.9*   No results for input(s): LIPASE, AMYLASE in the last 168 hours. No results for input(s): AMMONIA in the last 168 hours. Coagulation Profile: Recent Labs  Lab 07/19/19 1053  INR 1.2   Cardiac Enzymes: No results for input(s): CKTOTAL, CKMB, CKMBINDEX, TROPONINI in the last 168 hours. BNP (last 3 results) No results for input(s): PROBNP in the last 8760 hours. HbA1C: No results for input(s): HGBA1C in the last 72 hours. CBG: No results for input(s): GLUCAP in the last 168 hours. Lipid Profile: No results for input(s): CHOL, HDL, LDLCALC, TRIG, CHOLHDL, LDLDIRECT in the last 72 hours. Thyroid Function Tests: No results for input(s): TSH, T4TOTAL, FREET4, T3FREE, THYROIDAB in the last 72 hours. Anemia Panel: No results for input(s): VITAMINB12, FOLATE, FERRITIN, TIBC, IRON, RETICCTPCT in the last 72 hours. Urine analysis:    Component Value Date/Time   COLORURINE YELLOW 06/08/2015 [place] 06/08/2015 1056   LABSPEC 1.013 06/08/2015 1056   PHURINE 7.5 06/08/2015 1056   GLUCOSEU NEGATIVE 06/08/2015 1056   HGBUR TRACE (A) 06/08/2015 1056   BILIRUBINUR NEGATIVE 06/08/2015 1056   KETONESUR NEGATIVE 06/08/2015 1056   PROTEINUR NEGATIVE 06/08/2015  1056   UROBILINOGEN 0.2 09/26/2009 1906   NITRITE NEGATIVE 06/08/2015 [place] 06/08/2015 1056     Laker Thompson M.D. [facility] 07/24/2019, 10:54 AM   Call night coverage person covering after 7pm

## 2019-07-24 NOTE — Progress Notes (Signed)
The patient is receiving Protonix by the intravenous route.  Based on criteria approved by the Pharmacy and [place], the medication is being converted to the equivalent oral dose form.  These criteria include: -No active GI bleeding -Able to tolerate diet of full liquids (or better) or tube feeding -Able to tolerate other medications by the oral or enteral route  If you have any questions about this conversion, please contact the Pharmacy Department (phone [phone]).  Thank you.  Eudelia Bunch, Pharm.D 07/24/2019 12:08 PM

## 2019-07-25 ENCOUNTER — Ambulatory Visit
Admit: 2019-07-25 | Discharge: 2019-07-25 | Disposition: A | Discharge status: Home / Self Care | Attending: Radiation Oncology | Admitting: Radiation Oncology

## 2019-07-25 DIAGNOSIS — I495 Sick sinus syndrome: Secondary | ICD-10-CM

## 2019-07-25 LAB — CBC
HCT: 33.8 % — ABNORMAL LOW (ref 39.0–52.0)
Hemoglobin: 10.6 g/dL — ABNORMAL LOW (ref 13.0–17.0)
MCH: 29.1 pg (ref 26.0–34.0)
MCHC: 31.4 g/dL (ref 30.0–36.0)
MCV: 92.9 fL (ref 80.0–100.0)
Platelets: 78 10*3/uL — ABNORMAL LOW (ref 150–400)
RBC: 3.64 MIL/uL — ABNORMAL LOW (ref 4.22–5.81)
RDW: 15.3 % (ref 11.5–15.5)
WBC: 2.5 10*3/uL — ABNORMAL LOW (ref 4.0–10.5)
nRBC: 0 % (ref 0.0–0.2)

## 2019-07-25 MED ORDER — BISACODYL 10 MG RE SUPP: 10.0000 mg | Freq: Every day | RECTAL | Status: DC | PRN

## 2019-07-25 MED ORDER — HEPARIN SOD (PORK) LOCK FLUSH 100 UNIT/ML IV SOLN: 500.0000 [IU] | INTRAVENOUS | Status: DC

## 2019-07-25 MED ORDER — LACTATED RINGERS IV SOLN
INTRAVENOUS | Status: DC
  Administered 2019-07-25: 1000 mL via INTRAVENOUS

## 2019-07-25 MED ORDER — HEPARIN SOD (PORK) LOCK FLUSH 100 UNIT/ML IV SOLN
500.0000 [IU] | INTRAVENOUS | Status: DC | PRN
  Filled 2019-07-25: qty 5

## 2019-07-25 MED ORDER — HEPARIN SOD (PORK) LOCK FLUSH 100 UNIT/ML IV SOLN: 500.0000 [IU] | INTRAVENOUS | Status: DC | PRN

## 2019-07-25 NOTE — Care Management Important Message (Signed)
Important Message  Patient Details IM Letter given to Marney Doctor RN Case Manager to present to the Patient Name: [name]. MRN: [id number] Date of Birth: 1935-02-16   Medicare Important Message Given:  Yes     Kerin Salen 07/25/2019, 10:06 AM

## 2019-07-25 NOTE — Progress Notes (Signed)
PROGRESS NOTE    [name].  [id number]  DOB: [birth year]  PCP: [name], MD Admit date:07/19/2019  85 year old male with COPD, CAD, GERD, paroxysmal A. Fib, SSS s/p pacemaker, PVD, history of prostate CA, esophageal CA presented with worsening hemoptysis and hematemesis. No fevers or chills. Has hard time eating, choking sensation and increasing dysphagia. Feels dizzy with ambulation ED Course: Afebrile, hemodynamically stable. CT Chest showed progressive enlargement of the necrotic mass associated with the upper esophagus, now measuring 4.5 x 3.1 x 5.9 cm, previously 3.3 x 2.6 x 4.1 cm, with new invasion of the posterior wall of the trachea. Ct also reported emphysema/ unchanged cirrhosis/bilateral nonobstructive nephrolithiasis. Oncology was consulted by ED who recommended XRT Hospital course: Patient admitted to [facility] with GI/Oncology consultations.  Subjective:  Patient has had dysphagia and unable to tolerate diet/swallow pills.  Seen by oncology and IR referral for PEG tube placed.  He reports feeling constipated.  Objective: Vitals:   07/24/19 0608 07/24/19 1347 07/24/19 2014 07/25/19 0610  BP: 119/66 (!) 122/53 115/79 122/75  Pulse: 77 (!) 102 94 80  Resp: 18 16 16 16   Temp: 99.2 F (37.3 C) 98.6 F (37 C) 99.7 F (37.6 C) 97.7 F (36.5 C)  TempSrc: Oral Oral Oral Oral  SpO2: 95% 91% (!) 89% (!) 89%  Weight:      Height:        Intake/Output Summary (Last 24 hours) at 07/25/2019 0942 Last data filed at 07/24/2019 1755 Gross per 24 hour  Intake 524.35 ml  Output --  Net 524.35 ml   Filed Weights   07/19/19 1739  Weight: 77 kg    Physical Examination:  General exam: Appears calm and comfortable  Respiratory system: Clear to auscultation. Respiratory effort normal. Cardiovascular system: S1 & S2 heard, RRR. No JVD, murmurs, rubs, gallops or clicks. No pedal edema. Gastrointestinal system: Abdomen is nondistended, soft and nontender. Normal  bowel sounds heard. Central nervous system: Alert and oriented. No new focal neurological deficits. Extremities: No contractures, edema or joint deformities.  Skin: No rashes, lesions or ulcers Psychiatry: Judgement and insight appear normal. Mood & affect appropriate.   Data Reviewed: I have personally reviewed following labs and imaging studies  CBC: Recent Labs  Lab 07/20/19 0611 07/20/19 0611 07/20/19 2051 07/22/19 0539 07/23/19 0617 07/24/19 0551 07/25/19 0535  WBC 3.0*  --   --  3.9* 2.5* 2.7* 2.5*  HGB 7.7*   < > 8.8* 9.3* 5.3* 10.4* 10.6*  HCT 25.0*   < > 27.1* 29.3* 17.7* 32.3* 33.8*  MCV 93.3  --   --  93.0 97.3 92.0 92.9  PLT 93*  --   --  91* 51* 70* 78*   < > = values in this interval not displayed.   Basic Metabolic Panel: Recent Labs  Lab 07/19/19 1048 07/20/19 0611 07/22/19 0539  NA 138 139 138  K 3.7 3.8 3.8  CL 107 107 105  CO2 22 24 25   GLUCOSE 105* 88 92  BUN 18 15 15   CREATININE 0.96 0.95 0.92  CALCIUM 8.7* 8.4* 8.3*   GFR: Estimated Creatinine Clearance: 56.9 mL/min (by C-G formula based on SCr of 0.92 mg/dL). Liver Function Tests: Recent Labs  Lab 07/19/19 1048  AST 27  ALT 15  ALKPHOS 192*  BILITOT 1.1  PROT 7.1  ALBUMIN 2.9*   No results for input(s): LIPASE, AMYLASE in the last 168 hours. No results for input(s): AMMONIA in the last 168 hours. Coagulation Profile:  Recent Labs  Lab 07/19/19 1053  INR 1.2   Cardiac Enzymes: No results for input(s): CKTOTAL, CKMB, CKMBINDEX, TROPONINI in the last 168 hours. BNP (last 3 results) No results for input(s): PROBNP in the last 8760 hours. HbA1C: No results for input(s): HGBA1C in the last 72 hours. CBG: No results for input(s): GLUCAP in the last 168 hours. Lipid Profile: No results for input(s): CHOL, HDL, LDLCALC, TRIG, CHOLHDL, LDLDIRECT in the last 72 hours. Thyroid Function Tests: No results for input(s): TSH, T4TOTAL, FREET4, T3FREE, THYROIDAB in the last 72 hours. Anemia  Panel: No results for input(s): VITAMINB12, FOLATE, FERRITIN, TIBC, IRON, RETICCTPCT in the last 72 hours. Sepsis Labs: No results for input(s): PROCALCITON, LATICACIDVEN in the last 168 hours.  Recent Results (from the past 240 hour(s))  Respiratory Panel by RT PCR (Flu A&B, Covid) - Nasopharyngeal Swab     Status: None   Collection Time: 07/19/19 10:54 AM   Specimen: Nasopharyngeal Swab  Result Value Ref Range Status   SARS Coronavirus 2 by RT PCR NEGATIVE NEGATIVE Final    Comment: (NOTE) SARS-CoV-2 target nucleic acids are NOT DETECTED. The SARS-CoV-2 RNA is generally detectable in upper respiratoy specimens during the acute phase of infection. The lowest concentration of SARS-CoV-2 viral copies this assay can detect is 131 copies/mL. A negative result does not preclude SARS-Cov-2 infection and should not be used as the sole basis for treatment or other patient management decisions. A negative result may occur with  improper specimen collection/handling, submission of specimen other than nasopharyngeal swab, presence of viral mutation(s) within the areas targeted by this assay, and inadequate number of viral copies (<131 copies/mL). A negative result must be combined with clinical observations, patient history, and epidemiological information. The expected result is Negative. Fact Sheet for Patients:  PinkCheek.be Fact Sheet for Healthcare Providers:  GravelBags.it This test is not yet ap proved or cleared by the [place] FDA and  has been authorized for detection and/or diagnosis of SARS-CoV-2 by FDA under an Emergency Use Authorization (EUA). This EUA will remain  in effect (meaning this test can be used) for the duration of the COVID-19 declaration under Section 564(b)(1) of the Act, 21 U.S.C. section 360bbb-3(b)(1), unless the authorization is terminated or revoked sooner.    Influenza A by PCR NEGATIVE  NEGATIVE Final   Influenza B by PCR NEGATIVE NEGATIVE Final    Comment: (NOTE) The Xpert Xpress SARS-CoV-2/FLU/RSV assay is intended as an aid in  the diagnosis of influenza from Nasopharyngeal swab specimens and  should not be used as a sole basis for treatment. Nasal washings and  aspirates are unacceptable for Xpert Xpress SARS-CoV-2/FLU/RSV  testing. Fact Sheet for Patients: PinkCheek.be Fact Sheet for Healthcare Providers: GravelBags.it This test is not yet approved or cleared by the [place] FDA and  has been authorized for detection and/or diagnosis of SARS-CoV-2 by  FDA under an Emergency Use Authorization (EUA). This EUA will remain  in effect (meaning this test can be used) for the duration of the  Covid-19 declaration under Section 564(b)(1) of the Act, 21  U.S.C. section 360bbb-3(b)(1), unless the authorization is  terminated or revoked. Performed at [facility], [place] [address] [place], [place] [address]   Culture, blood (routine x 2)     Status: None (Preliminary result)   Collection Time: 07/23/19  8:05 AM   Specimen: BLOOD  Result Value Ref Range Status   Specimen Description   Final    BLOOD RIGHT ARM  Performed at [facility], [place] [address], [place], [place] [address]    Special Requests   Final    BOTTLES DRAWN AEROBIC AND ANAEROBIC Blood Culture adequate volume Performed at [facility] [address] [place], [place] [address]    Culture   Final    NO GROWTH 2 DAYS Performed at [facility] [address] [place], [place] [address]    Report Status PENDING  Incomplete  Culture, blood (routine x 2)     Status: None (Preliminary result)   Collection Time: 07/23/19  8:10 AM   Specimen: BLOOD  Result Value Ref Range Status   Specimen Description   Final    BLOOD LEFT ARM Performed at [facility]  [address], [place], [place] [address]    Special Requests   Final    BOTTLES DRAWN AEROBIC AND ANAEROBIC Blood Culture adequate volume Performed at [facility] [address] [place], [place] [address]    Culture   Final    NO GROWTH 2 DAYS Performed at [facility] [address] [place],  [address]    Report Status PENDING  Incomplete      Radiology Studies: No results found.      Scheduled Meds: . Chlorhexidine Gluconate Cloth  6 each Topical Daily  . feeding supplement (ENSURE ENLIVE)  237 mL Oral BID BM  . heparin lock flush  500 Units Intracatheter Q30 days  . lidocaine  15 mL Mouth/Throat TID AC & HS  . metoprolol tartrate  2.5 mg Intravenous Q6H  . nystatin  5 mL Oral QID  . pantoprazole (PROTONIX) IV  40 mg Intravenous Q12H   Continuous Infusions: . fluconazole (DIFLUCAN) IV 100 mg (07/24/19 1617)  . lactated ringers    . piperacillin-tazobactam (ZOSYN)  IV 3.375 g (07/25/19 0940)     Assessment/Plan:  1.Esophageal malignancy with hemoptysis and hematemesis :Patient has history of locally recurrent esophageal cancer, currently on nivolumab, following Dr. Benay Spice.CT showed enlargement of the upper esophageal mass with evidence of tracheal invasion.Per GI, no role of endoscopy at this time and recommended oncology and XRT. Oncology recommending palliative radiation as doesn't appear to be amendable for embolization and unresponsive to systemic therapy. Radiation oncology consulted, CT simulation on 5/6, rec'd 5 weeks of definitive XRT starting 5/10. Plan for d/c home after the first radiation treatment.He will be able to arrange transportation with his family starting 5/11 on Tuesday for the radiation treatment.Patient has outpatient follow-up with ENT at [facility] next week.  2.Acute on chronic anemia: secondary to #1 and possibly chemotherapy-induced pancytopenia.Status post 1 unit packed RBCs on 5/5,2 units packed RBC on  5/8.H&H stable, platelets improving  3. Oral lesions, mucositis-Placed on nystatin S&S, fluconazole for 7 days, states lidocaine solution help  4. Dysphagia: Secondary to #1 vs #3. Does not like full liquid diet, will advance to dysphagia 3 diet per SLP but nurse reported difficult swallowing pills yesterday. Oral meds held. Remains on IV meds including Protonix, flucanozole and metoprolol. Added IV morphine 2 mg every 3 hours as needed for severe pain. Patient starting XRT  today.  Also referred for PEG tube placement by oncology service.  N.p.o./IV fluids for now.  5. Fevers/possible developing aspiration PNA -Patient was spiking low-grade fevers, rhonchorous lung sounds in the last 2 days.Chest x-ray with no active disease. Continue IV Zosyn today, transition to oral Augmentin (via PEG) at the time of discharge  6. CAD status post CABG, history of paroxysmal A. fib, history of aortic stenosis, pacemaker, AAA-Currently normal sinus rhythm.BP currently stable, continue beta-blocker, hold off on losartan  7. GERD-Continue PPI  8.  OSA-CPAP if able to tolerate, refusing this time  9. Generalized debility-Patient reports that he is feeling weak, will have PT OT evaluation for assessment   DVT prophylaxis: SCDs given #1 Code Status: DNR per d/w Dr Learta Codding since 5/7 Family / Patient Communication: Patient and family [name]) been in discussions with primary team/oncology .  Will follow up with family in a.m. Disposition Plan:   Status is: Inpatient  Remains inpatient appropriate because:IV treatments appropriate due to intensity of illness or inability to take PO, needs PEG tube.   Dispo: The patient is from: Home  Anticipated d/c is to: Home  Anticipated d/c date is: 1 day  Patient currently is not medically stable to d/c.    LOS: 5 days    Time spent: 35 minutes    Guilford Shi, MD [facility] Pager in  [place]  If 7PM-7AM, please contact night-coverage [email or web address] 07/25/2019, 9:42 AM

## 2019-07-25 NOTE — Progress Notes (Addendum)
IP PROGRESS NOTE  Subjective:   Still having dysphagia.  Denies hemoptysis.  Reports that he is coughing up phlegm at this time.  Requesting pain medication.  Scheduled to begin radiation later today.  Requesting feeding tube.  Objective: Vital signs in last 24 hours: Blood pressure 122/75, pulse 80, temperature 97.7 F (36.5 C), temperature source Oral, resp. rate 16, height _0  (1.702 m), weight 77 kg, SpO2 (!) 89 %.  Intake/Output from previous day: 05/09 0701 - 05/10 0700 In: 524.4 [P.O.:320; IV Piggyback:204.4] Out: -   Physical Exam:  HEENT: No thrush Lungs: Right greater than left expiratory wheeze/rhonchi Cardiac: Regular rate and rhythm Abdomen: Nontender, no hepatosplenomegaly Extremities: No leg edema   Portacath/PICC-without erythema  Lab Results: Recent Labs    07/24/19 0551 07/25/19 0535  WBC 2.7* 2.5*  HGB 10.4* 10.6*  HCT 32.3* 33.8*  PLT 70* 78*    BMET No results for input(s): NA, K, CL, CO2, GLUCOSE, BUN, CREATININE, CALCIUM in the last 72 hours.  Lab Results  Component Value Date   CEA1 2.08 02/07/2019    Studies/Results: DG Chest Port 1 View  Result Date: 07/23/2019 CLINICAL DATA:  Hemoptysis. Personal history of esophageal carcinoma. EXAM: PORTABLE CHEST 1 VIEW COMPARISON:  07/19/2019 FINDINGS: The heart size and mediastinal contours are within normal limits. Prior CABG and aortic valve replacement again noted. Dual lead transvenous pacemaker remains in appropriate position. Left-sided power port also remains in expected position. Pulmonary hyperinflation again seen, consistent with COPD. No evidence of pulmonary infiltrate or edema. No evidence of pneumothorax or pleural effusion. IMPRESSION: Stable exam. COPD. No active disease. Electronically Signed   By: Marlaine Hind M.D.   On: 07/23/2019 10:52    Medications: I have reviewed the patient's current medications.  Assessment/Plan: 1. 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 Staging CTs 10/26/2017-asymmetric esophageal thickening, no evidence of metastatic disease ? Endoscopic biopsy 10/20/2017-mid esophagus partially obstructing mass, biopsy confirmed squamous cell carcinoma ? PET scan 11/02/2017-wall thickening in the mid esophagus SUV 27 MA: No findings specific for metastatic disease ? Radiation 11/09/2017-12/17/2017 ? Cycle 1 weekly Taxol/carboplatin 11/10/2017 ? Cycle 2 weekly Taxol/carboplatin 11/17/2017 (carboplatin held due to thrombocytopenia) ? Cycle 3 weekly Taxol/carboplatin 12/08/2017 (dosesreduced due to neutropenia, thrombocytopenia) ? Cycle 4 weekly Taxol/carboplatin 12/15/2017 ? Endoscopy 03/29/2018-no mass seen ? CT chest 12/29/2018-2 cm area associated with anterior wall the proximal esophagus, new-nonspecific, changes of cirrhosis ? PD-L1 CPS 5% ? Cycle 1 FOLFOX 02/07/2019 ? Cycle 2 FOLFOX 03/07/2019, Neulasta ? Cycle 3 FOLFOX 03/29/2019, Neulasta (oxaliplatin dose reduced to 40 mg/m due to thrombocytopenia following cycle 2) ? Cycle 4 FOLFOX 04/12/2019 ? Cycle 5 FOLFOX 04/26/2019 ? Cycle 6 FOLFOX 05/11/2019 ? CT chest 05/20/2019-enlargement of mass at the upper esophagus, changes of cirrhosis and emphysema ? Cycle 1 nivolumab 05/25/2019 ? Cycle 2 nivolumab 06/09/2019 ? Cycle 3 nivolumab 06/24/2019  ? Cycle 4 nivolumab 07/07/2019 2. Solid dysphagia and odynophagia secondary to #1 3. Coronary artery disease 4. History of prostate cancer 5. COPD 6. Permanent cardiac pacemaker 7. Peripheral vascular disease 8. Chronic thrombocytopenia 9. 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 CT chest 12/28/2017-no evidence of pulmonary emboli. Esophageal thickening consistent with patient's history of esophageal carcinoma. 11. Probable cirrhosis, referred to GI 12. Anemia-progressive compared to when he was here in June, potentially related to GI bleeding or epistaxis. Red cell transfusions 03/21/2019 and  04/28/2019 13. Hoarseness secondary to left vocal cord paralysis, likely due to recurrent tumor in  the mediastinum. Status post evaluation by [name], not a candidate for vocal fold injection at present. 14. Port-A-Cath placed 02/02/2011 15. Neutropenia secondary to chemotherapy 16. 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 Admission 07/19/2019 with hemoptysis  Mr. Brodowski appears stable.  He has local recurrence of esophageal cancer.  He is symptomatic with dysphagia and had hemoptysis which is now stopped.  CT scan showed enlargement of the esophageal mass with tracheal invasion.  Palliative radiation to this enlarging mass is due to begin later today.  Systemic treatment options are limited given his continued progression of disease following FOLFOX nivolumab.  He has poor p.o. intake secondary to severe dysphagia.  Requesting PEG tube placement.  CODE STATUS has been changed to DNR/DNI.  A TOC referral has been placed for evaluation by hospice.  Recommendations: 1.   Proceed with radiation as scheduled starting 07/25/2019 2.   Continue IV fluids given his poor p.o. intake.  I have placed a referral to interventional radiology for evaluation for PEG tube placement. 3.  Discontinue nivolumab. 4.  Referral has been placed to hospice. 5.  DNR/DNI. 6.  Consider aspiration pneumonia for a persistent fever.   LOS: 5 days   [name], NP   07/25/2019, 8:41 AM Mr. Halley appears unchanged.  He was transfused with red blood cells over the weekend.  He has severe dysphagia.  No hemoptysis today.  He will begin palliative radiation today.  We discussed comfort care and hospice.  He agrees to home hospice care.  He would like to have a feeding tube placed for hydration and nutrition.  I think this is reasonable as he has no evidence of distant metastatic disease and could potentially live for months with adequate nutrition.  We will place a referral to interventional radiology to consider  placement of a gastrostomy tube.

## 2019-07-25 NOTE — Progress Notes (Signed)
Referring Physician(s): [name]  Supervising Physician: Sandi Mariscal  Patient Status:  [facility] - In-pt  Chief Complaint:  Dysphagia, esophageal cancer  Subjective: Patient familiar to IR service from Port-A-Cath placement on 02/02/2019.  He has a significant past medical history including COPD, coronary artery disease with prior CABG as well as aortic stenosis and prior TAVR, GERD, AAA, paroxysmal atrial fibrillation, obstructive sleep apnea, pacemaker placement secondary to sick sinus syndrome, peripheral vascular disease, prostate cancer as well as esophageal cancer.  He was admitted to [facility] on 5/4 with hemoptysis/hematemesis and worsening dysphagia.  He is COVID-19 negative.  Request now received for percutaneous gastrostomy tube placement to assist with nutritional needs.  He currently denies fever, headache, chest pain, abdominal/back pain, nausea, vomiting.  He does have constipation, some dyspnea with exertion as well as occasional cough.  Past Medical History:  Diagnosis Date  . AAA (abdominal aortic aneurysm) ([place])    a. Last duplex - 3.8cm 01/2013 - due 01/2014.  [name] Acne rosacea   . Alcoholic hepatitis with ascites 08/28/2017  . Anginal pain ([place]) 1996  . Aortic stenosis, mild    Last echo 05/04/12 +LVH  . Arthritis    "shoulders" (09/01/2017)  . Back pain    hx epidural injections  . Baker's cyst    Lt.  [name] CAD (coronary artery disease)    a. s/p MI/PTCA - Cx 1994. b. s/p CABG x5 in 1997. c. Abnl nuc 2003- occ SVG-RCA, patent seq SVG-OM1-dCxOM, patentl LIMA-LAD, patent, SVG-diag. d. Normal nuc 09/2012.  . Carotid artery disease ([place])    a. Duplex 01/2013: mildly abnormal. Mild plaque without significant diameter reduction. Repeat recommended 11/15.  [name] COPD (chronic obstructive pulmonary disease) ([place])   . Dizziness and giddiness 08/04/2013  . GERD (gastroesophageal reflux disease)   . H/O: GI bleed    mild, neg. colonoscopy  . Heart murmur   . HOH (hard  of hearing)   . HTN (hypertension)   . Hyperlipidemia   . Hypertensive heart disease   . Myocardial infarction ([place]) 1994  . OSA on CPAP   . OSA treated with BiPAP 11/20/2015   Severe with [facility] 38/hr  . PAF (paroxysmal atrial fibrillation) ([place])    a. Remote per records.   . Pneumonia    history  . Presence of permanent cardiac pacemaker    DOI 1999 with change out 2006; St Jude  . Prostate cancer ([place]) ~ 2010   a. s/p radical retropubic prostatectomy with bilateral pelvic lymph node dissection  . PVD (peripheral vascular disease) ([place])    a. Rt ext iliac stenosis, last PV cath 2003, moderate stenosis last Lower Ext Dopplers 12/16/11 - Rt. ABI 0.96  Lt ABI 1.0.  . Sick sinus syndrome ([place])    a. placed 1999, gen change 2011 - St. Jude.  . Sinus node dysfunction ([place]) 01/20/2013  . Syncope 11/28/97  . Thrombocytopenia ([facility])    chronic, mild  . Valvular heart disease    a. Echo 04/2012: mild-mod AS, mild AI, mild MR.   Past Surgical History:  Procedure Laterality Date  . APPENDECTOMY    . BIOPSY  10/20/2017   Procedure: BIOPSY;  Surgeon: Doran Stabler, MD;  Location: [facility];  Service: Gastroenterology;;  . CARDIAC CATHETERIZATION  2003   VG to RCA occluded, other grafts patent  . CARDIAC CATHETERIZATION N/A 05/04/2015   Procedure: Right/Left Heart Cath and Coronary/Graft Angiography;  Surgeon: Sherren Mocha, MD;  Location: [place] [facility];  Service: Cardiovascular;  Laterality: N/A;  . CARDIAC CATHETERIZATION  09/01/2017  . CARDIAC VALVE REPLACEMENT    . CATARACT EXTRACTION W/ INTRAOCULAR LENS  IMPLANT, BILATERAL Bilateral   . COLONOSCOPY    . CORONARY ANGIOPLASTY  03/29/92   PTCA to LCX  . CORONARY ARTERY BYPASS GRAFT  1997   LIMA-LAD; VG-diag; seq VG-1st OM & distal LCX; VG-RCA  . CORONARY ATHERECTOMY N/A 09/01/2017   Procedure: CORONARY ATHERECTOMY;  Surgeon: Jettie Booze, MD;  Location: [place] [facility];  Service: Cardiovascular;  Laterality: N/A;  .  CORONARY ATHERECTOMY  10/01/2017  . CORONARY ATHERECTOMY N/A 10/01/2017   Procedure: CORONARY ATHERECTOMY;  Surgeon: Leonie Man, MD;  Location: [place] [facility];  Service: Cardiovascular;  Laterality: N/A;  . ESOPHAGOGASTRODUODENOSCOPY (EGD) WITH PROPOFOL N/A 10/20/2017   Procedure: ESOPHAGOGASTRODUODENOSCOPY (EGD) WITH PROPOFOL;  Surgeon: Doran Stabler, MD;  [facility]: [facility];  Service: Gastroenterology;  Laterality: N/A;  . ESOPHAGOGASTRODUODENOSCOPY (EGD) WITH PROPOFOL N/A 03/29/2018   Procedure: ESOPHAGOGASTRODUODENOSCOPY (EGD) WITH PROPOFOL;  Surgeon: Doran Stabler, MD;  [facility]: [facility];  Service: Gastroenterology;  Laterality: N/A;  . INSERT / REPLACE / REMOVE PACEMAKER  11/28/1997   pacesetter--ERI 2011  . IR IMAGING GUIDED PORT INSERTION  02/02/2019  . LEFT HEART CATH N/A 09/01/2017   Procedure: Left Heart Cath;  Surgeon: Jettie Booze, MD;  Location: [place] [facility];  Service: Cardiovascular;  Laterality: N/A;  . LEFT HEART CATH AND CORS/GRAFTS ANGIOGRAPHY N/A 08/28/2017   Procedure: LEFT HEART CATH AND CORS/GRAFTS ANGIOGRAPHY;  Surgeon: Leonie Man, MD;  Location: [place] [facility];  Service: Cardiovascular;  Laterality: N/A;  . PACEMAKER GENERATOR CHANGE  08/15/2009   St. Jude accent  . SUPRAPUBIC PROSTATECTOMY    . TEE WITHOUT CARDIOVERSION N/A 06/12/2015   Procedure: TRANSESOPHAGEAL ECHOCARDIOGRAM (TEE);  Surgeon: Sherren Mocha, MD;  Location: [place];  Service: Open Heart Surgery;  Laterality: N/A;  . TONSILLECTOMY    . TRANSCATHETER AORTIC VALVE REPLACEMENT, TRANSFEMORAL N/A 06/12/2015   Procedure: TRANSCATHETER AORTIC VALVE REPLACEMENT, TRANSFEMORAL, possible transpical;  Surgeon: Sherren Mocha, MD;  Location: [place];  Service: Open Heart Surgery;  Laterality: N/A;      Allergies: Patient has no known allergies.  Medications: Prior to Admission medications   Medication Sig Start Date End Date Taking? Authorizing Provider    acetaminophen (TYLENOL) 325 MG tablet Take 650 mg by mouth 2 (two) times daily.   Yes [provider]  atorvastatin (LIPITOR) 40 MG tablet Take 1 tablet (40 mg total) by mouth daily at 6 PM. 08/28/17  Yes Kayleen Memos, DO  HYDROcodone-acetaminophen (HYCET) 7.5-325 mg/15 ml solution Take 5-10 mLs by mouth every 6 (six) hours as needed for moderate pain. 06/29/19  Yes Owens Shark, NP  lidocaine-prilocaine (EMLA) cream Apply to port site one hour prior to use 01/20/19  Yes Owens Shark, NP  losartan (COZAAR) 25 MG tablet TAKE 1 TABLET BY MOUTH DAILY Patient taking differently: Take 25 mg by mouth at bedtime.  12/08/18  Yes Daune Perch, NP  metoprolol tartrate (LOPRESSOR) 25 MG tablet TAKE 1 TABLET BY MOUTH TWICE DAILY Patient taking differently: Take 25 mg by mouth 2 (two) times daily.  05/03/19  Yes Baldwin Jamaica, PA-C  nitroGLYCERIN (NITROSTAT) 0.4 MG SL tablet PLACE 1 TABLET UNDER THE TONGUE EVERY 5 MINUTES AS NEEDED FOR CHEST PAIN UP TO 3 DOSES, IF SYMPTOMS PERSIST CALL 911 Patient taking differently: Place 0.4 mg under  the tongue every 5 (five) minutes as needed for chest pain.  01/05/19  Yes Lyda Jester M, PA-C  NON FORMULARY Apply 1 application topically See admin instructions. Tr[place]a pain cream: Rub into both shoulders 2 times a day   Yes [provider]  pantoprazole (PROTONIX) 40 MG tablet TAKE 1 TABLET BY MOUTH EVERY DAY Patient taking differently: Take 40 mg by mouth daily.  03/02/19  Yes Turner, Eber Hong, MD  prochlorperazine (COMPAZINE) 5 MG tablet Take 1 tablet (5 mg total) by mouth every 6 (six) hours as needed for nausea or vomiting. 01/20/19  Yes Owens Shark, NP  senna (SENOKOT) 8.6 MG tablet Take 1 tablet by mouth as needed for constipation.   Yes [provider]     Vital Signs: BP (!) 109/59 (BP Location: Right Arm)   Pulse 82   Temp 97.6 F (36.4 C) (Oral)   Resp 14   Ht 5\' 7"  (1.702 m)   Wt 169 lb 12.1 oz (77 kg)   SpO2 91%    BMI 26.59 kg/m   Physical Exam awake, alert.  Chest with few scattered rhonchi bilaterally.  Right chest wall pacer and left chest wall Port-A-Cath intact.  Heart with regular rate and rhythm; abdomen soft, positive bowel sounds, nontender.  No significant lower extremity edema. Imaging: DG Chest Port 1 View  Result Date: 07/23/2019 CLINICAL DATA:  Hemoptysis. Personal history of esophageal carcinoma. EXAM: PORTABLE CHEST 1 VIEW COMPARISON:  07/19/2019 FINDINGS: The heart size and mediastinal contours are within normal limits. Prior CABG and aortic valve replacement again noted. Dual lead transvenous pacemaker remains in appropriate position. Left-sided power port also remains in expected position. Pulmonary hyperinflation again seen, consistent with COPD. No evidence of pulmonary infiltrate or edema. No evidence of pneumothorax or pleural effusion. IMPRESSION: Stable exam. COPD. No active disease. Electronically Signed   By: Marlaine Hind M.D.   On: 07/23/2019 10:52    Labs:  CBC: Recent Labs    07/22/19 0539 07/23/19 0617 07/24/19 0551 07/25/19 0535  WBC 3.9* 2.5* 2.7* 2.5*  HGB 9.3* 5.3* 10.4* 10.6*  HCT 29.3* 17.7* 32.3* 33.8*  PLT 91* 51* 70* 78*    COAGS: Recent Labs    02/02/19 0820 07/19/19 1053  INR 1.1 1.2    BMP: Recent Labs    07/07/19 1005 07/19/19 1048 07/20/19 0611 07/22/19 0539  NA 138 138 139 138  K 4.1 3.7 3.8 3.8  CL 109 107 107 105  CO2 23 22 24 25   GLUCOSE 129* 105* 88 92  BUN 19 18 15 15   CALCIUM 8.8* 8.7* 8.4* 8.3*  CREATININE 1.01 0.96 0.95 0.92  GFRNONAA >60 >60 >60 >60  GFRAA >60 >60 >60 >60    LIVER FUNCTION TESTS: Recent Labs    06/09/19 1415 06/24/19 1335 07/07/19 1005 07/19/19 1048  BILITOT 0.7 0.7 0.7 1.1  AST 41 30 29 27   ALT 17 15 15 15   ALKPHOS 195* 201* 218* 192*  PROT 6.4* 6.8 6.8 7.1  ALBUMIN 2.9* 2.8* 2.7* 2.9*    Assessment and Plan:   85 yo male with significant past medical history including COPD, coronary  artery disease with prior CABG as well as aortic stenosis and prior TAVR, GERD, AAA, paroxysmal atrial fibrillation, obstructive sleep apnea, pacemaker placement secondary to sick sinus syndrome, peripheral vascular disease, prostate cancer as well as esophageal cancer.  He was admitted to [facility] on 5/4 with hemoptysis/hematemesis and worsening dysphagia.  He is COVID-19 negative.  Request now received for percutaneous gastrostomy tube placement to assist with nutritional needs.  Imaging studies have been reviewed by Dr. Anselm Pancoast. Risks and benefits image guided gastrostomy tube placement was discussed with the patient including, but not limited to the need for a barium enema during the procedure, bleeding, infection, peritonitis and/or damage to adjacent structures.  All of the patient's questions were answered, patient is agreeable to proceed.  Consent signed and in chart.  Patient is currently afebrile, WBC 2.5, hemoglobin 10.6, platelets 78k, creat nl; blood cultures with no growth to date; he is currently on IV Zosyn.  Procedure tentatively scheduled for 5/11   Electronically Signed: D. Rowe Robert, PA-C 07/25/2019, 2:32 PM   I spent a total of 30 minutes at the the patient's bedside AND on the patient's hospital floor or unit, greater than 50% of which was counseling/coordinating care for percutaneous gastrostomy tube placement    Patient ID: Curtis Newcomer., male   DOB: 04-13-1934, 85 y.o.   MRN: [id number]

## 2019-07-25 NOTE — Progress Notes (Signed)
Pt refuses CPAP QHS, RT to monitor and assess as needed.  

## 2019-07-25 NOTE — Evaluation (Signed)
Physical Therapy Evaluation Patient Details Name: [name]. MRN: [id number] DOB: 04/06/1934 Today's Date: 07/25/2019   History of Present Illness  Patient is 85 year old male with COPD, CAD, GERD, paroxysmal A. fib, pacemaker, PVD, history of prostate CA, esophageal CA. Patient presented with worsening hemoptysis and hematemesis. He reports he has a hard time eating, a choking sensation and increasing dysphagia. Pt also has been dizzy with ambulation.    Clinical Impression  Curtis Doell. is 85 y.o. male admitted with above HPI and diagnosis. Patient is currently limited by functional impairments below (see PT problem list). Patient lives alone and is independent at baseline. Patient reports difficulty at home wearing CPAP but was able to tolerate O2 via nasal cannula. He desaturated to 87% on RA with mobility and improved to >95% with 2-3L/min. Patient may benefit from home O2. Patient will benefit from continued skilled PT interventions to address impairments and progress independence with mobility, recommending HHPT for line management training if pt returns home with O2. Acute PT will follow and progress as able.     Follow Up Recommendations Home health PT(benefit from [facility] for line training if pt goe home with O2)    Equipment Recommendations  None recommended by PT    Recommendations for Other Services       Precautions / Restrictions Precautions Precautions: Fall Precaution Comments: denies falls in last 6 months Restrictions Weight Bearing Restrictions: No      Mobility  Bed Mobility Overal bed mobility: Needs Assistance Bed Mobility: Supine to Sit     Supine to sit: Supervision;HOB elevated     General bed mobility comments: pt reports he has electric adjustable bed at home. can raise HOB up to sit up.  Transfers Overall transfer level: Needs assistance Equipment used: None Transfers: Sit to/from Stand Sit to Stand: Min guard         General  transfer comment: pt able to complete power up without assist. no overt LOB noted with rising.   Ambulation/Gait Ambulation/Gait assistance: Min guard;Min assist Gait Distance (Feet): 190 Feet Assistive device: None;IV Pole Gait Pattern/deviations: Step-through pattern;Decreased step length - left;Decreased step length - right;Decreased stride length;Narrow base of support Gait velocity: decreased   General Gait Details: pt with no overt LOB during gait but slightly unsteady, min guard for safety and assist for IV pole management. pt reports 2/4 on dyspnea scale with activity.  Stairs       Wheelchair Mobility    Modified Rankin (Stroke Patients Only)       Balance Overall balance assessment: Needs assistance Sitting-balance support: Feet supported Sitting balance-Leahy Scale: Good     Standing balance support: During functional activity;No upper extremity supported;Single extremity supported Standing balance-Leahy Scale: Good              Pertinent Vitals/Pain Pain Assessment: No/denies pain    Home Living Family/patient expects to be discharged to:: Private residence Living Arrangements: Alone Available Help at Discharge: Family Type of Home: Mobile home Home Access: Stairs to enter Entrance Stairs-Rails: Can reach both Entrance Stairs-Number of Steps: 4 Home Layout: One level Home Equipment: Shower seat;Grab bars - tub/shower;Grab bars - toilet Additional Comments: pt's daughter lives a coupel of homes down from him and assists intermittently.    Prior Function Level of Independence: Independent         Comments: pt reports independence with mobility and no device, reports independence with ADL's. He has a shower seat but does not use it. He is still  driving himself to his appointments     Hand Dominance   Dominant Hand: Right    Extremity/Trunk Assessment   Upper Extremity Assessment Upper Extremity Assessment: Defer to OT evaluation    Lower  Extremity Assessment Lower Extremity Assessment: Overall WFL for tasks assessed    Cervical / Trunk Assessment Cervical / Trunk Assessment: Normal(slighlty kyphotic in thoracic spine)  Communication   Communication: HOH(wears hearing aids)  Cognition Arousal/Alertness: Awake/alert Behavior During Therapy: WFL for tasks assessed/performed Overall Cognitive Status: Within Functional Limits for tasks assessed             General Comments General comments (skin integrity, edema, etc.): Pt resting at 92% on RA at start of session. pt desaturated to 87% with gait on RA and SpO2 returned to 97% on 3L/min at EOS. Pt decreased to 2L/min and RN notified. He reports he cannot wear CPAP at night due to defiiculty with coughing while wearing it.     Exercises     Assessment/Plan    PT Assessment Patient needs continued PT services  PT Problem List Decreased strength;Decreased activity tolerance;Decreased balance;Decreased mobility;Decreased knowledge of use of DME       PT Treatment Interventions DME instruction;Gait training;Functional mobility training;Stair training;Therapeutic activities;Therapeutic exercise;Balance training;Patient/family education    PT Goals (Current goals can be found in the Care Plan section)  Acute Rehab PT Goals Patient Stated Goal: to go home PT Goal Formulation: With patient Time For Goal Achievement: 08/08/19 Potential to Achieve Goals: Good    Frequency Min 3X/week    AM-PAC PT "6 Clicks" Mobility  Outcome Measure Help needed turning from your back to your side while in a flat bed without using bedrails?: None Help needed moving from lying on your back to sitting on the side of a flat bed without using bedrails?: None Help needed moving to and from a bed to a chair (including a wheelchair)?: A Little Help needed standing up from a chair using your arms (e.g., wheelchair or bedside chair)?: A Little Help needed to walk in hospital room?: A Little Help  needed climbing 3-5 steps with a railing? : A Little 6 Click Score: 20    End of Session Equipment Utilized During Treatment: Gait belt;Oxygen Activity Tolerance: Patient tolerated treatment well Patient left: in chair(with OT in room) Nurse Communication: Mobility status;Other (comment)(need for home O2) PT Visit Diagnosis: Unsteadiness on feet (R26.81);Difficulty in walking, not elsewhere classified (R26.2)    Time: [id number] PT Time Calculation (min) (ACUTE ONLY): 20 min   Charges:   PT Evaluation $PT Eval Moderate Complexity: 1 Mod         Gwynneth Albright PT, DPT Physical Therapist with [facility] [phone]  07/25/2019 1:46 PM

## 2019-07-25 NOTE — Evaluation (Signed)
Occupational Therapy Evaluation Patient Details Name: [name]. MRN: [id number] DOB: 04-18-1934 Today's Date: 07/25/2019    History of Present Illness Hx esophageal CA, worsening hematemesis x2 weeks w/ fatigue found to have increase of esphageal mass   Clinical Impression   This 85 yo male admitted with above presents to acute OT with mild balance deficits but not affecting his safety and independence with basic ADLs. No further OT needs, but pt would probably benefit from home O2 to wear at least at night (needs during day with activity too, but not sure he will wear it)--pt reports he cannot wear his CPAP at home due to all the mucous he coughs up consistently throughout the day and night.     Follow Up Recommendations  No OT follow up    Equipment Recommendations  Other (comment)(pt reports he does not wear cpap at home due to mucous he has to cough up throughout day and night, would benefit from O2 to wear instead due to drop in O2 sats with activity.)       Precautions / Restrictions Precautions Precautions: Fall Restrictions Weight Bearing Restrictions: No      Mobility Bed Mobility               General bed mobility comments: Pt just finishing up ambulating with PT on my arrvial  Transfers Overall transfer level: Needs assistance Equipment used: None Transfers: Sit to/from Stand Sit to Stand: Supervision              Balance Overall balance assessment: Mild deficits observed, not formally tested                                         ADL either performed or assessed with clinical judgement   ADL Overall ADL's : Independent                                             Vision Baseline Vision/History: Wears glasses Wears Glasses: Reading only              Pertinent Vitals/Pain Pain Assessment: No/denies pain     Hand Dominance Right   Extremity/Trunk Assessment Upper Extremity Assessment Upper  Extremity Assessment: Overall WFL for tasks assessed     Communication Communication Communication: HOH   Cognition Arousal/Alertness: Awake/alert Behavior During Therapy: WFL for tasks assessed/performed Overall Cognitive Status: Within Functional Limits for tasks assessed                                                Home Living Family/patient expects to be discharged to:: Private residence Living Arrangements: Alone Available Help at Discharge: Family Type of Home: Mobile home Home Access: Stairs to enter Technical brewer of Steps: 4 Entrance Stairs-Rails: Can reach both Home Layout: One level     Bathroom Shower/Tub: Teacher, early years/pre: Standard Bathroom Accessibility: Yes   Home Equipment: Shower seat;Grab bars - tub/shower;Grab bars - toilet   Additional Comments: pt currently standing for shower and not sitting even though he has the seat      Prior Functioning/Environment Level of Independence: Independent  Comments: pt denies falls in last 6 months; pt wears readers, has 2 outdoor cats at home. daughter lives ~3 trailers down. pt still driving.        OT Problem List: (generalized weakness)         OT Goals(Current goals can be found in the care plan section) Acute Rehab OT Goals Patient Stated Goal: to go home  OT Frequency:                AM-PAC OT "6 Clicks" Daily Activity     Outcome Measure Help from another person eating meals?: None Help from another person taking care of personal grooming?: None Help from another person toileting, which includes using toliet, bedpan, or urinal?: None Help from another person bathing (including washing, rinsing, drying)?: None Help from another person to put on and taking off regular upper body clothing?: None Help from another person to put on and taking off regular lower body clothing?: None 6 Click Score: 24   End of Session    Activity Tolerance:  Patient tolerated treatment well Patient left: in chair;with call bell/phone within reach;with chair alarm set  OT Visit Diagnosis: Unsteadiness on feet (R26.81)                Time: [id number] OT Time Calculation (min): 14 min Charges:  OT General Charges $OT Visit: 1 Visit OT Evaluation $OT Eval Low Complexity: 1 Low [name], OTR/L Acute [employer] Pager [phone] Office [phone]     Almon Register 07/25/2019, 12:14 PM

## 2019-07-26 ENCOUNTER — Inpatient Hospital Stay (HOSPITAL_COMMUNITY): Payer: Medicare Other

## 2019-07-26 ENCOUNTER — Ambulatory Visit
Admit: 2019-07-26 | Discharge: 2019-07-26 | Disposition: A | Discharge status: Home / Self Care | Attending: Radiation Oncology | Admitting: Radiation Oncology

## 2019-07-26 ENCOUNTER — Telehealth: Payer: Self-pay

## 2019-07-26 ENCOUNTER — Inpatient Hospital Stay

## 2019-07-26 DIAGNOSIS — I480 Paroxysmal atrial fibrillation: Secondary | ICD-10-CM

## 2019-07-26 HISTORY — PX: IR GASTROSTOMY TUBE MOD SED: IMG625

## 2019-07-26 LAB — BASIC METABOLIC PANEL
Anion gap: 10 (ref 5–15)
BUN: 19 mg/dL (ref 8–23)
CO2: 26 mmol/L (ref 22–32)
Calcium: 8.7 mg/dL — ABNORMAL LOW (ref 8.9–10.3)
Chloride: 106 mmol/L (ref 98–111)
Creatinine, Ser: 0.98 mg/dL (ref 0.61–1.24)
GFR calc Af Amer: >60 mL/min (ref >60)
GFR calc non Af Amer: >60 mL/min (ref >60)
Glucose, Bld: 86 mg/dL (ref 70–99)
Potassium: 3.6 mmol/L (ref 3.5–5.1)
Sodium: 142 mmol/L (ref 135–145)

## 2019-07-26 LAB — CBC WITH DIFFERENTIAL/PLATELET
Abs Immature Granulocytes: 0.01 10*3/uL (ref 0.00–0.07)
Basophils Absolute: 0 10*3/uL (ref 0.0–0.1)
Basophils Relative: 0 %
Eosinophils Absolute: 0.1 10*3/uL (ref 0.0–0.5)
Eosinophils Relative: 3 %
HCT: 35.8 % — ABNORMAL LOW (ref 39.0–52.0)
Hemoglobin: 11.2 g/dL — ABNORMAL LOW (ref 13.0–17.0)
Immature Granulocytes: 0 %
Lymphocytes Relative: 13 %
Lymphs Abs: 0.4 10*3/uL — ABNORMAL LOW (ref 0.7–4.0)
MCH: 29.6 pg (ref 26.0–34.0)
MCHC: 31.3 g/dL (ref 30.0–36.0)
MCV: 94.5 fL (ref 80.0–100.0)
Monocytes Absolute: 0.4 10*3/uL (ref 0.1–1.0)
Monocytes Relative: 12 %
Neutro Abs: 2.3 10*3/uL (ref 1.7–7.7)
Neutrophils Relative %: 72 %
Platelets: 86 10*3/uL — ABNORMAL LOW (ref 150–400)
RBC: 3.79 MIL/uL — ABNORMAL LOW (ref 4.22–5.81)
RDW: 15.4 % (ref 11.5–15.5)
WBC: 3.2 10*3/uL — ABNORMAL LOW (ref 4.0–10.5)
nRBC: 0 % (ref 0.0–0.2)

## 2019-07-26 LAB — SURGICAL PCR SCREEN
MRSA, PCR: NEGATIVE
Staphylococcus aureus: NEGATIVE

## 2019-07-26 MED ORDER — FENTANYL CITRATE (PF) 100 MCG/2ML IJ SOLN
INTRAMUSCULAR | Status: AC
  Filled 2019-07-26: qty 2

## 2019-07-26 MED ORDER — MIDAZOLAM HCL 2 MG/2ML IJ SOLN
INTRAMUSCULAR | Status: AC | PRN
  Administered 2019-07-26 (×2): 1 mg via INTRAVENOUS

## 2019-07-26 MED ORDER — FENTANYL CITRATE (PF) 100 MCG/2ML IJ SOLN
INTRAMUSCULAR | Status: AC | PRN
  Administered 2019-07-26 (×2): 50 ug via INTRAVENOUS

## 2019-07-26 MED ORDER — SODIUM CHLORIDE 0.9 % IV SOLN: INTRAVENOUS | Status: DC

## 2019-07-26 MED ORDER — LIDOCAINE HCL 1 % IJ SOLN
INTRAMUSCULAR | Status: AC
  Filled 2019-07-26: qty 20

## 2019-07-26 MED ORDER — IOHEXOL 300 MG/ML  SOLN
50.0000 mL | Freq: Once | INTRAMUSCULAR | Status: AC | PRN
  Administered 2019-07-26: 10 mL

## 2019-07-26 MED ORDER — MIDAZOLAM HCL 2 MG/2ML IJ SOLN
INTRAMUSCULAR | Status: AC
  Filled 2019-07-26: qty 4

## 2019-07-26 MED ORDER — LIDOCAINE HCL (PF) 1 % IJ SOLN
INTRAMUSCULAR | Status: AC | PRN
  Administered 2019-07-26: 10 mL

## 2019-07-26 NOTE — TOC Initial Note (Signed)
Transition of Care (TOC) - Initial/Assessment Note    Patient Details  Name: [name]. MRN: [id number] Date of Birth: January 17, 1935  Transition of Care [facility]) CM/SW Contact:    [name], Curtis Skiff, RN Phone Number: 07/26/2019, 12:12 PM  Clinical Narrative:                 [name] was following pt for palliative services in the community prior to admission. PMT contacted [facility] to initiate hospice services. This CM spoke with [facility] liaison to inform of PEG placement today. Per liaison pt would still be eligible for home hospice with PEG tube. TOC will continue to follow.  Expected Discharge Plan: Home w Hospice Care Barriers to Discharge: Continued Medical Work up   Patient Goals and CMS Choice        Expected Discharge Plan and Services Expected Discharge Plan: [place]                                              Prior Living Arrangements/Services                       Activities of Daily Living Home Assistive Devices/Equipment: Eyeglasses, Hearing aid(reading glasses, bilateral hearing aides) ADL Screening (condition at time of admission) Patient's cognitive ability adequate to safely complete daily activities?: Yes Is the patient deaf or have difficulty hearing?: No Does the patient have difficulty seeing, even when wearing glasses/contacts?: No Does the patient have difficulty concentrating, remembering, or making decisions?: No Patient able to express need for assistance with ADLs?: Yes Does the patient have difficulty dressing or bathing?: No Independently performs ADLs?: Yes (appropriate for developmental age) Does the patient have difficulty walking or climbing stairs?: Yes(gets short of breath) Weakness of Legs: Both Weakness of Arms/Hands: Both  Permission Sought/Granted                  Emotional Assessment              Admission diagnosis:  Cough with hemoptysis [R04.2] Hemoptysis [R04.2] Patient  Active Problem List   Diagnosis Date Noted  . DNR (do not resuscitate)   . Failure to thrive in adult   . Odynophagia   . Palliative care by specialist   . Cough with hemoptysis 07/19/2019  . Hemoptysis   . VT (ventricular tachycardia) ([place]) 05/22/2019  . LBBB (left bundle branch block) 05/22/2019  . Port-A-Cath in place 02/21/2019  . Goals of care, counseling/discussion 01/20/2019  . Unstable angina ([place])   . Presbycusis of both ears 04/08/2018  . Squamous cell esophageal cancer ([place])   . Anticoagulated by anticoagulation treatment 03/19/2018  . Epistaxis 03/19/2018  . Pacemaker 02/19/2018  . Coronary artery disease involving coronary bypass graft of native heart without angina pectoris 02/19/2018  . Anemia 01/19/2018  . Hypercholesterolemia 12/28/2017  . Encounter for antineoplastic chemotherapy 12/08/2017  . Malignant tumor of middle third of esophagus ([place]) 10/28/2017  . Melena   . Heme positive stool   . Acute post-hemorrhagic anemia   . Esophageal mass   . Angina, class III ([place]) 10/01/2017  . Chest pain with moderate risk for cardiac etiology 09/01/2017  . Chest pain, rule out acute myocardial infarction 08/26/2017  . Essential hypertension 05/14/2016  . OSA (obstructive sleep apnea) 11/20/2015  . Obesity (BMI 30-39.9) 11/20/2015  .  S/P TAVR (transcatheter aortic valve replacement) 06/12/2015  . Severe aortic stenosis 06/05/2015  . AAA (abdominal aortic aneurysm) without rupture ([place]) 04/27/2015  . PAD (peripheral artery disease) ([place]) 04/27/2015  . Dizziness and giddiness 08/04/2013  . Dizziness 07/19/2013  . SSS (sick sinus syndrome) ([place]) 01/20/2013  . Hypertensive cardiovascular disease 01/20/2013  . Paroxysmal atrial fibrillation (HCC)   . CAD, multiple vessel   . Pacemaker -[employer]   . Aortic stenosis 05/07/2012    Class: Diagnosis of  . Syncope 11/28/1997   PCP:  Curtis Downing, MD Pharmacy:   [id number] - [place], [place]. [place] [address] Phone: [phone] Fax: [phone]     Social Determinants of Health (SDOH) Interventions    Readmission Risk Interventions Readmission Risk Prevention Plan 07/26/2019  PCP or Specialist Appt within 3-5 Days Complete  HRI or [place] Complete  Social Work Consult for [place] Planning/Counseling Complete  Palliative Care Screening Complete  Medication Review Press photographer) Complete  Some recent data might be hidden

## 2019-07-26 NOTE — Progress Notes (Signed)
PROGRESS NOTE    [name].  [id number]  DOB: 1934-03-20  PCP: [name], MD Admit date:07/19/2019  85 year old male with COPD, CAD, GERD, paroxysmal A. Fib, SSS s/p pacemaker, PVD, history of prostate CA, esophageal CA presented with worsening hemoptysis and hematemesis. No fevers or chills. Has hard time eating, choking sensation and increasing dysphagia. Feels dizzy with ambulation ED Course: Afebrile, hemodynamically stable. CT Chest showed progressive enlargement of the necrotic mass associated with the upper esophagus, now measuring 4.5 x 3.1 x 5.9 cm, previously 3.3 x 2.6 x 4.1 cm, with new invasion of the posterior wall of the trachea. Ct also reported emphysema/ unchanged cirrhosis/bilateral nonobstructive nephrolithiasis. Oncology was consulted by ED who recommended XRT Hospital course: Patient admitted to [facility] with GI/Oncology consultations.  Subjective:  Patient has had dysphagia and unable to tolerate diet/swallow pills.  Seen by oncology and IR referral for PEG tube placed--scheduled for today.  Objective: Vitals:   07/26/19 1333 07/26/19 1400 07/26/19 1444 07/26/19 1547  BP: 107/71 132/60 135/61 (!) 148/72  Pulse: 77 82 72 84  Resp: 16  15 18   Temp: 99 F (37.2 C)  97.9 F (36.6 C) 97.9 F (36.6 C)  TempSrc: Oral  Oral Oral  SpO2: 92% 92% 97% 93%  Weight:      Height:       No intake or output data in the 24 hours ending 07/26/19 1621 Filed Weights   07/19/19 1739  Weight: 77 kg    Physical Examination:  General exam: Appears calm and comfortable  Respiratory system: Clear to auscultation. Respiratory effort normal. Cardiovascular system: S1 & S2 heard, RRR. No JVD, murmurs, rubs, gallops or clicks. No pedal edema. Gastrointestinal system: Abdomen is nondistended, soft and nontender. Normal bowel sounds heard. Central nervous system: Alert and oriented. No new focal neurological deficits. Extremities: No contractures, edema or joint  deformities.  Skin: No rashes, lesions or ulcers Psychiatry: Judgement and insight appear normal. Mood & affect appropriate.   Data Reviewed: I have personally reviewed following labs and imaging studies  CBC: Recent Labs  Lab 07/22/19 0539 07/23/19 0617 07/24/19 0551 07/25/19 0535 07/26/19 0600  WBC 3.9* 2.5* 2.7* 2.5* 3.2*  NEUTROABS  --   --   --   --  2.3  HGB 9.3* 5.3* 10.4* 10.6* 11.2*  HCT 29.3* 17.7* 32.3* 33.8* 35.8*  MCV 93.0 97.3 92.0 92.9 94.5  PLT 91* 51* 70* 78* 86*   Basic Metabolic Panel: Recent Labs  Lab 07/20/19 0611 07/22/19 0539 07/26/19 0600  NA 139 138 142  K 3.8 3.8 3.6  CL 107 105 106  CO2 24 25 26   GLUCOSE 88 92 86  BUN 15 15 19   CREATININE 0.95 0.92 0.98  CALCIUM 8.4* 8.3* 8.7*   GFR: Estimated Creatinine Clearance: 53.4 mL/min (by C-G formula based on SCr of 0.98 mg/dL). Liver Function Tests: No results for input(s): AST, ALT, ALKPHOS, BILITOT, PROT, ALBUMIN in the last 168 hours. No results for input(s): LIPASE, AMYLASE in the last 168 hours. No results for input(s): AMMONIA in the last 168 hours. Coagulation Profile: No results for input(s): INR, PROTIME in the last 168 hours. Cardiac Enzymes: No results for input(s): CKTOTAL, CKMB, CKMBINDEX, TROPONINI in the last 168 hours. BNP (last 3 results) No results for input(s): PROBNP in the last 8760 hours. HbA1C: No results for input(s): HGBA1C in the last 72 hours. CBG: No results for input(s): GLUCAP in the last 168 hours. Lipid Profile: No results for input(s):  CHOL, HDL, LDLCALC, TRIG, CHOLHDL, LDLDIRECT in the last 72 hours. Thyroid Function Tests: No results for input(s): TSH, T4TOTAL, FREET4, T3FREE, THYROIDAB in the last 72 hours. Anemia Panel: No results for input(s): VITAMINB12, FOLATE, FERRITIN, TIBC, IRON, RETICCTPCT in the last 72 hours. Sepsis Labs: No results for input(s): PROCALCITON, LATICACIDVEN in the last 168 hours.  Recent Results (from the past 240 hour(s))    Respiratory Panel by RT PCR (Flu A&B, Covid) - Nasopharyngeal Swab     Status: None   Collection Time: 07/19/19 10:54 AM   Specimen: Nasopharyngeal Swab  Result Value Ref Range Status   SARS Coronavirus 2 by RT PCR NEGATIVE NEGATIVE Final    Comment: (NOTE) SARS-CoV-2 target nucleic acids are NOT DETECTED. The SARS-CoV-2 RNA is generally detectable in upper respiratoy specimens during the acute phase of infection. The lowest concentration of SARS-CoV-2 viral copies this assay can detect is 131 copies/mL. A negative result does not preclude SARS-Cov-2 infection and should not be used as the sole basis for treatment or other patient management decisions. A negative result may occur with  improper specimen collection/handling, submission of specimen other than nasopharyngeal swab, presence of viral mutation(s) within the areas targeted by this assay, and inadequate number of viral copies (<131 copies/mL). A negative result must be combined with clinical observations, patient history, and epidemiological information. The expected result is Negative. Fact Sheet for Patients:  PinkCheek.be Fact Sheet for Healthcare Providers:  GravelBags.it This test is not yet ap proved or cleared by the [place] FDA and  has been authorized for detection and/or diagnosis of SARS-CoV-2 by FDA under an Emergency Use Authorization (EUA). This EUA will remain  in effect (meaning this test can be used) for the duration of the COVID-19 declaration under Section 564(b)(1) of the Act, 21 U.S.C. section 360bbb-3(b)(1), unless the authorization is terminated or revoked sooner.    Influenza A by PCR NEGATIVE NEGATIVE Final   Influenza B by PCR NEGATIVE NEGATIVE Final    Comment: (NOTE) The Xpert Xpress SARS-CoV-2/FLU/RSV assay is intended as an aid in  the diagnosis of influenza from Nasopharyngeal swab specimens and  should not be used as a sole  basis for treatment. Nasal washings and  aspirates are unacceptable for Xpert Xpress SARS-CoV-2/FLU/RSV  testing. Fact Sheet for Patients: PinkCheek.be Fact Sheet for Healthcare Providers: GravelBags.it This test is not yet approved or cleared by the [place] FDA and  has been authorized for detection and/or diagnosis of SARS-CoV-2 by  FDA under an Emergency Use Authorization (EUA). This EUA will remain  in effect (meaning this test can be used) for the duration of the  Covid-19 declaration under Section 564(b)(1) of the Act, 21  U.S.C. section 360bbb-3(b)(1), unless the authorization is  terminated or revoked. Performed at [facility], [place] [address] [place], [place] [address]   Culture, blood (routine x 2)     Status: None (Preliminary result)   Collection Time: 07/23/19  8:05 AM   Specimen: BLOOD  Result Value Ref Range Status   Specimen Description   Final    BLOOD RIGHT ARM Performed at [facility] [address], [place], [place] [address]    Special Requests   Final    BOTTLES DRAWN AEROBIC AND ANAEROBIC Blood Culture adequate volume Performed at [facility] [address], [place], [place] [address]    Culture   Final    NO GROWTH 3 DAYS Performed at [facility], [address] [place], [place] [address]    Report Status PENDING  Incomplete  Culture, blood (routine x 2)     Status: None (Preliminary result)   Collection Time: 07/23/19  8:10 AM   Specimen: BLOOD  Result Value Ref Range Status   Specimen Description   Final    BLOOD LEFT ARM Performed at [facility] [address], [place], [place] [address]    Special Requests   Final    BOTTLES DRAWN AEROBIC AND ANAEROBIC Blood Culture adequate volume Performed at [facility] [address] [place], [place] [address]    Culture   Final     NO GROWTH 3 DAYS Performed at [facility], [place] [address] [place], [place] [address]    Report Status PENDING  Incomplete  Surgical pcr screen     Status: None   Collection Time: 07/26/19  6:53 AM   Specimen: Nasal Mucosa; Nasal Swab  Result Value Ref Range Status   MRSA, PCR NEGATIVE NEGATIVE Final   Staphylococcus aureus NEGATIVE NEGATIVE Final    Comment: (NOTE) The Xpert SA Assay (FDA approved for NASAL specimens in patients 27 years of age and older), is one component of a comprehensive surveillance program. It is not intended to diagnose infection nor to guide or monitor treatment. Performed at [facility], [place] [address], [place], [place] [address]       Radiology Studies: IR GASTROSTOMY TUBE MOD SED  Result Date: 07/26/2019 INDICATION: History of esophageal cancer. Please place percutaneous gastrostomy tube placement for enteric nutrition supplementation purposes. EXAM: PUSH GASTROSTOMY TUBE PLACEMENT COMPARISON:  Chest CT-07/19/2018 MEDICATIONS: Patient is currently admitted to the hospital and receiving intravenous antibiotics; Antibiotics were administered within 1 hour of the procedure. CONTRAST:  10 mL of Isovue 300 administered into the gastric lumen. ANESTHESIA/SEDATION: Moderate (conscious) sedation was employed during this procedure. A total of Versed 2 mg and Fentanyl 100 mcg was administered intravenously. Moderate Sedation Time: 17 minutes. [name] level of consciousness and vital signs were monitored continuously by radiology nursing throughout the procedure under my direct supervision. FLUOROSCOPY TIME:  2 minutes, 36 seconds ([id number] mGy) COMPLICATIONS: None immediate. PROCEDURE: Informed written consent was obtained from patient following explanation of the procedure, risks, benefits and alternatives. A time out was performed prior to the initiation of the procedure. Ultrasound scanning was performed to demarcate the edge of the left lobe  of the liver. Maximal barrier sterile technique utilized including caps, mask, sterile gowns, sterile gloves, large sterile drape, hand hygiene and Betadine prep. The left upper quadrant was sterilely prepped and draped. A oral gastric catheter was inserted into the stomach under fluoroscopy. The existing nasogastric feeding tube was removed. The left costal margin and barium opacified transverse colon were identified and avoided. Air was injected into the stomach for insufflation and visualization under fluoroscopy. Under sterile conditions and local anesthesia, 3 T tacks were utilized to pexy the anterior aspect of the stomach against the ventral abdominal wall. Contrast injection confirmed appropriate positioning of each of the T tacks. An incision was made between the T tacks and a 17 gauge trocar needle was utilized to access the stomach. Needle position was confirmed within the stomach with aspiration of air and injection of a small amount of contrast. An Amplatz wire was coiled within the gastric antrum. Next, under intermittent fluoroscopic guidance, the track was serially dilated ultimately allowing placement of a 22 French peel-away sheath. Next, a 17 [place]  balloon retention gastrostomy tube was placed and the retention balloon was insufflated with a mixture of dilute saline and contrast and pulled taut against the anterior wall of the stomach. The external disc was cinched. Contrast injection confirms positioning within the stomach. Several spot radiographic images were obtained in various obliquities for documentation. The patient tolerated procedure well without immediate post procedural complication. FINDINGS: After successful fluoroscopic guided placement, the gastrostomy tube is appropriately positioned with internal retention balloon against the ventral aspect of the gastric lumen. IMPRESSION: Successful fluoroscopic insertion of an 59 French balloon retention gastrostomy tube. The gastrostomy may  be used immediately for medication administration and in 24 hrs for the initiation of feeds. Electronically Signed   By: Sandi Mariscal M.D.   On: 07/26/2019 13:12        Scheduled Meds: . Chlorhexidine Gluconate Cloth  6 each Topical Daily  . feeding supplement (ENSURE ENLIVE)  237 mL Oral BID BM  . heparin lock flush  500 Units Intracatheter Q30 days  . lidocaine      . lidocaine  15 mL Mouth/Throat TID AC & HS  . metoprolol tartrate  2.5 mg Intravenous Q6H  . nystatin  5 mL Oral QID  . pantoprazole (PROTONIX) IV  40 mg Intravenous Q12H   Continuous Infusions: . sodium chloride    . fluconazole (DIFLUCAN) IV 100 mg (07/26/19 1445)  . lactated ringers 75 mL/hr at 07/25/19 2208  . piperacillin-tazobactam (ZOSYN)  IV 3.375 g (07/26/19 1126)     Assessment/Plan:  1.Esophageal malignancy with hemoptysis and hematemesis :Patient has history of locally recurrent esophageal cancer, currently on nivolumab, following Dr. Benay Spice.CT showed enlargement of the upper esophageal mass with evidence of tracheal invasion.Per GI, no role of endoscopy at this time and recommended oncology and XRT. Oncology recommending palliative radiation as doesn't appear to be amendable for embolization and unresponsive to systemic therapy. Radiation oncology consulted, CT simulation on 5/6, rec'd 5 weeks of definitive XRT starting 5/10. Plan for d/c home with home hospice after the first radiation treatment ( received today) .He will be able to arrange transportation with his family starting 5/11 on Tuesday for the radiation treatment.Patient has outpatient follow-up with ENT at [facility] next week.  2.Acute on chronic anemia: secondary to #1 and possibly chemotherapy-induced pancytopenia.Status post 1 unit packed RBCs on 5/5,2 units packed RBC on 5/8.H&H stable, platelets improving  3. Oral lesions, mucositis-Placed on nystatin S&S, fluconazole for 7 days, states lidocaine solution help  4. Dysphagia:  Secondary to #1 vs #3. Does not like full liquid diet, will advance to dysphagia 3 diet per SLP but nurse reported difficult swallowing pills yesterday. Oral meds held. Remains on IV meds including Protonix, flucanozole and metoprolol. Added IV morphine 2 mg every 3 hours as needed for severe pain. Patient starting XRT  today.  Also referred for PEG tube placement by oncology service although patient has chosen home hospice--d/w palliative care MD who again d/w patient regarding care goals and stated okay for proceeding with PEG as planned .  N.p.o./IV fluids for now. IR performed procedure this afternoon and stated okay to start feeds in am.  5. Fevers/possible developing aspiration PNA -Patient was spiking low-grade fevers, rhonchorous lung sounds in the last 2 days.Chest x-ray with no active disease. Continue IV Zosyn today, transition to oral Augmentin (via PEG) at the time of discharge  6. CAD status post CABG, history of paroxysmal A. fib, history of aortic stenosis, pacemaker, AAA-Currently normal sinus rhythm.BP currently stable, continue  beta-blocker, hold off on losartan  7. GERD-Continue PPI  8.  OSA-CPAP if able to tolerate, refusing this time  9. Generalized debility-Patient reports that he is feeling weak, will have PT OT evaluation for assessment   DVT prophylaxis: SCDs given #1 Code Status: DNR per d/w Dr Learta Codding since 5/7 Family / Patient Communication: Patient and family Therese Sarah) been in discussions with primary team/oncology .  Will follow up with family in a.m. Disposition Plan:   Status is: Inpatient  Remains inpatient appropriate because:IV treatments appropriate due to intensity of illness or inability to take PO, needs PEG tube.   Dispo: The patient is from: Home  Anticipated d/c is to: Home  Anticipated d/c date is: 1 day  Patient currently is not medically stable to d/c. To d/c home in am after initiation of PEG  feeds and with home hospice    LOS: 6 days    Time spent: 35 minutes    Guilford Shi, MD [facility] Pager in [place]  If 7PM-7AM, please contact night-coverage [email or web address] 07/26/2019, 4:21 PM

## 2019-07-26 NOTE — Procedures (Signed)
Pre procedure Dx: Dysphagia Post Procedure Dx: Same  Successful fluoroscopic guided insertion of gastrostomy tube.   The gastrostomy tube may be used immediately for medications.   Tube feeds may be initiated in 24 hours as per the primary team.    EBL: Trace  Complications: None immediate  Jay Claudene Gatliff, MD Pager #: [phone]     

## 2019-07-26 NOTE — Progress Notes (Signed)
[facility] Therapy Treatment Record Phone [phone]   Radiation Therapy was administered to [facility]. on: 07/26/2019  10:45 AM and was treatment # 2 out of a planned course of 10 treatments.  Radiation Treatment  1). Beam photons with 6-10 energy and Photons 10-19 MeV  2). Brachytherapy None  3). Stereotactic Radiosurgery None  4). Other Radiation None     Kalysta Kneisley, RT (T)

## 2019-07-26 NOTE — Progress Notes (Signed)
Pt refuses CPAP QHS, RT to monitor and assess as needed.  

## 2019-07-26 NOTE — Progress Notes (Signed)
PT Cancellation Note  Patient Details Name: [name]. MRN: [id number] DOB: 11/19/1934   Cancelled Treatment:    Reason Eval/Treat Not Completed: Patient at procedure or test/unavailable(Patient off the floor/unit for procedure (PEG placement today). Will follow up at later date/time as schedule allows.)   Verner Mould, DPT Physical Therapist with [facility] [phone]  07/26/2019 12:24 PM

## 2019-07-26 NOTE — Progress Notes (Signed)
[facility]: [employer]    Pt has been approved for hospice care at home with [facility]. He has been approved with radiation continuation for 10tx or less for sx management of bleeding. He has also been approved with PEG tube placement at home. We do prefer for pt to have bolus feeding if he can tolerate them to allow him more freedom to get to radiation treatments and decrease the demands on family and pt at home rather than continuous feedings by pump.   Please call with any questions Webb Silversmith RN [phone]

## 2019-07-27 ENCOUNTER — Ambulatory Visit
Admit: 2019-07-27 | Discharge: 2019-07-27 | Disposition: A | Discharge status: Home / Self Care | Attending: Radiation Oncology | Admitting: Radiation Oncology

## 2019-07-27 DIAGNOSIS — R627 Adult failure to thrive: Secondary | ICD-10-CM

## 2019-07-27 DIAGNOSIS — I2510 Atherosclerotic heart disease of native coronary artery without angina pectoris: Secondary | ICD-10-CM

## 2019-07-27 DIAGNOSIS — I714 Abdominal aortic aneurysm, without rupture: Secondary | ICD-10-CM

## 2019-07-27 DIAGNOSIS — E440 Moderate protein-calorie malnutrition: Secondary | ICD-10-CM | POA: Insufficient documentation

## 2019-07-27 DIAGNOSIS — I10 Essential (primary) hypertension: Secondary | ICD-10-CM

## 2019-07-27 DIAGNOSIS — K228 Other specified diseases of esophagus: Secondary | ICD-10-CM

## 2019-07-27 DIAGNOSIS — Z952 Presence of prosthetic heart valve: Secondary | ICD-10-CM

## 2019-07-27 DIAGNOSIS — J189 Pneumonia, unspecified organism: Secondary | ICD-10-CM

## 2019-07-27 MED ORDER — HYDROCODONE-ACETAMINOPHEN 7.5-325 MG/15ML PO SOLN: 15.0000 mL | Freq: Four times a day (QID) | ORAL | 0 refills | Status: DC | PRN

## 2019-07-27 MED ORDER — FLUCONAZOLE 10 MG/ML PO SUSR
100.0000 mg | Freq: Every day | ORAL | 0 refills | Status: AC
Start: 2019-07-27 — End: 2019-07-30

## 2019-07-27 MED ORDER — BISACODYL 10 MG RE SUPP: 10.0000 mg | Freq: Every day | RECTAL | 0 refills | Status: AC | PRN

## 2019-07-27 MED ORDER — ESOMEPRAZOLE MAGNESIUM 20 MG PO PACK: 20.0000 mg | PACK | Freq: Every day | ORAL | 0 refills | Status: DC

## 2019-07-27 MED ORDER — METOPROLOL TARTRATE 25 MG PO TABS: 25.0000 mg | ORAL_TABLET | Freq: Two times a day (BID) | ORAL | 0 refills | Status: DC

## 2019-07-27 MED ORDER — ACETAMINOPHEN 325 MG PO TABS: 650.0000 mg | ORAL_TABLET | Freq: Two times a day (BID) | ORAL | Status: AC

## 2019-07-27 MED ORDER — OSMOLITE 1.2 CAL PO LIQD: 1000.0000 mL | ORAL | Status: DC

## 2019-07-27 MED ORDER — PROCHLORPERAZINE MALEATE 5 MG PO TABS: 5.0000 mg | ORAL_TABLET | Freq: Four times a day (QID) | ORAL | 2 refills | Status: AC | PRN

## 2019-07-27 MED ORDER — OSMOLITE 1.5 CAL PO LIQD
237.0000 mL | Freq: Four times a day (QID) | ORAL | Status: DC
  Administered 2019-07-27: 237 mL
  Filled 2019-07-27 (×3): qty 237

## 2019-07-27 MED ORDER — AMOXICILLIN-POT CLAVULANATE 250-62.5 MG/5ML PO SUSR: 500.0000 mg | Freq: Two times a day (BID) | ORAL | 0 refills | Status: AC

## 2019-07-27 NOTE — Progress Notes (Signed)
Physical Therapy Treatment Patient Details Name: [name]. MRN: [id number] DOB: Mar 31, 1934 Today's Date: 07/27/2019    History of Present Illness Patient is 85 year old male with COPD, CAD, GERD, paroxysmal A. fib, pacemaker, PVD, history of prostate CA, esophageal CA. Patient presented with worsening hemoptysis and hematemesis. He reports he has a hard time eating, a choking sensation and increasing dysphagia. Pt also has been dizzy with ambulation.    PT Comments    Pt is making steady progress with mobility. Pt remains limited by SOB with activity and desaturation. O2 sats stayed around 88-91% with gait using pursed lip breathing techniques and rest breaks. Pt did use the restroom and O2 dropped to 85% on RA and increased SOB. O2 placed back on and sats returned to 92%. Pt had no LOB and feel he will be safe to d/c back home alone. Recommend HHPT for education on O2 line management if plans are to d/c with O2. Pt will continue to benefit from acute skilled PT to maximize mobility until d/c home or goals met.    Follow Up Recommendations  Home health PT     Equipment Recommendations  None recommended by PT    Recommendations for Other Services       Precautions / Restrictions Precautions Precautions: Other (comment) Precaution Comments: SOB Restrictions Weight Bearing Restrictions: No    Mobility  Bed Mobility Overal bed mobility: Modified Independent Bed Mobility: Supine to Sit     Supine to sit: Modified independent (Device/Increase time);HOB elevated        Transfers Overall transfer level: Modified independent Equipment used: None Transfers: Sit to/from Stand Sit to Stand: Modified independent (Device/Increase time)            Ambulation/Gait Ambulation/Gait assistance: Supervision Gait Distance (Feet): 60 Feet(60 feet 4 times with rest breaks) Assistive device: IV Pole Gait Pattern/deviations: Step-through pattern;Decreased stride length Gait  velocity: decreased   General Gait Details: 4 reps of 60 feet with standing and sitting rest breaks for SOB. O2 sats between 88-92% with gait in the halls focusing on pursed lip breathing. Pt did drop to 85% after using the bathroom and returning to bed. Placed O2 on 2 liters and sats returned to 93% within 1 minute.   Stairs             Wheelchair Mobility    Modified Rankin (Stroke Patients Only)       Balance Overall balance assessment: No apparent balance deficits (not formally assessed) Sitting-balance support: Feet supported Sitting balance-Leahy Scale: Normal     Standing balance support: During functional activity;No upper extremity supported;Single extremity supported Standing balance-Leahy Scale: Good                              Cognition Arousal/Alertness: Awake/alert Behavior During Therapy: WFL for tasks assessed/performed Overall Cognitive Status: Within Functional Limits for tasks assessed                                        Exercises General Exercises - Lower Extremity Heel Slides: AROM;Strengthening;Both;10 reps;Supine Hip ABduction/ADduction: AROM;Strengthening;Both;10 reps;Supine Straight Leg Raises: AROM;Both;Strengthening;Supine;10 reps    General Comments        Pertinent Vitals/Pain Pain Assessment: Faces Faces Pain Scale: Hurts a little bit Pain Location: abdomen Pain Descriptors / Indicators: Discomfort Pain Intervention(s): Limited activity within patient's tolerance;Monitored during session;Repositioned  Home Living                      Prior Function            PT Goals (current goals can now be found in the care plan section) Progress towards PT goals: Progressing toward goals    Frequency    Min 3X/week      PT Plan Current plan remains appropriate    Co-evaluation              AM-PAC PT "6 Clicks" Mobility   Outcome Measure  Help needed turning from your back  to your side while in a flat bed without using bedrails?: None Help needed moving from lying on your back to sitting on the side of a flat bed without using bedrails?: None Help needed moving to and from a bed to a chair (including a wheelchair)?: A Little Help needed standing up from a chair using your arms (e.g., wheelchair or bedside chair)?: A Little Help needed to walk in hospital room?: A Little Help needed climbing 3-5 steps with a railing? : A Little 6 Click Score: 20    End of Session   Activity Tolerance: Patient tolerated treatment well Patient left: in bed;with call bell/phone within reach;with bed alarm set Nurse Communication: Mobility status;Other (comment) PT Visit Diagnosis: Unsteadiness on feet (R26.81);Difficulty in walking, not elsewhere classified (R26.2)     Time: 1580-6386 PT Time Calculation (min) (ACUTE ONLY): 26 min  Charges:  $Gait Training: 23-37 mins                      Lelon Mast 07/27/2019, 9:28 AM

## 2019-07-27 NOTE — Progress Notes (Signed)
[facility]:  [place] to pt's [employer]. Discussed with her that pt has had some SOB with exertion in hospital and has used oxygen off and on. She is in agreement with [place] ordering oxygen and a walker for him to be delivered. She does not have his key and will have to come to hospital to get this so that she can let [facility] in his home to deliver equipment. She states only other equipment he has in home is his CPAP but he does not use it at home.   Webb Silversmith RN [phone]

## 2019-07-27 NOTE — TOC Transition Note (Signed)
Transition of Care [facility]) - CM/SW Discharge Note   Patient Details  Name: [name]. MRN: [id number] Date of Birth: July 12, 1934  Transition of [facility]) CM/SW Contact:  Zacharias Ridling, Marjie Skiff, RN Phone Number: 07/27/2019, 3:26 PM   Clinical Narrative:    Pt to dc home with [facility]. Pt tube feeds to be delivered to the pt home tonight along with a walker and home 02. Portable 02 tank to be delivered to the hospital for pt transport home. DIL Alice to pick pt up this afternoon.   Readmission Risk Interventions Readmission Risk Prevention Plan 07/26/2019  PCP or Specialist Appt within 3-5 Days Complete  HRI or [place] Complete  Social Work Consult for [place] Planning/Counseling Complete  Palliative Care Screening Complete  Medication Review [employer]) Complete  Some recent data might be hidden

## 2019-07-27 NOTE — Progress Notes (Signed)
Referring Physician(s): Sherrill,B  Supervising Physician: Markus Daft  Patient Status:  [facility] - In-pt  Chief Complaint:  Dysphagia, esophageal cancer, poor nutritional intake  Subjective: Patient doing fairly well this a.m., denies any further hemoptysis; does have occasional cough; has some soreness at G-tube site as expected; denies nausea or vomiting   Allergies: Patient has no known allergies.  Medications: Prior to Admission medications   Medication Sig Start Date End Date Taking? Authorizing Provider  acetaminophen (TYLENOL) 325 MG tablet Take 650 mg by mouth 2 (two) times daily.   Yes [provider]  atorvastatin (LIPITOR) 40 MG tablet Take 1 tablet (40 mg total) by mouth daily at 6 PM. 08/28/17  Yes Kayleen Memos, DO  HYDROcodone-acetaminophen (HYCET) 7.5-325 mg/15 ml solution Take 5-10 mLs by mouth every 6 (six) hours as needed for moderate pain. 06/29/19  Yes Owens Shark, NP  lidocaine-prilocaine (EMLA) cream Apply to port site one hour prior to use 01/20/19  Yes Owens Shark, NP  losartan (COZAAR) 25 MG tablet TAKE 1 TABLET BY MOUTH DAILY Patient taking differently: Take 25 mg by mouth at bedtime.  12/08/18  Yes Daune Perch, NP  metoprolol tartrate (LOPRESSOR) 25 MG tablet TAKE 1 TABLET BY MOUTH TWICE DAILY Patient taking differently: Take 25 mg by mouth 2 (two) times daily.  05/03/19  Yes Baldwin Jamaica, PA-C  nitroGLYCERIN (NITROSTAT) 0.4 MG SL tablet PLACE 1 TABLET UNDER THE TONGUE EVERY 5 MINUTES AS NEEDED FOR CHEST PAIN UP TO 3 DOSES, IF SYMPTOMS PERSIST CALL 911 Patient taking differently: Place 0.4 mg under the tongue every 5 (five) minutes as needed for chest pain.  01/05/19  Yes Lyda Jester M, PA-C  NON FORMULARY Apply 1 application topically See admin instructions. Tridema pain cream: Rub into both shoulders 2 times a day   Yes [provider]  pantoprazole (PROTONIX) 40 MG tablet TAKE 1 TABLET BY MOUTH EVERY DAY Patient  taking differently: Take 40 mg by mouth daily.  03/02/19  Yes Turner, Eber Hong, MD  prochlorperazine (COMPAZINE) 5 MG tablet Take 1 tablet (5 mg total) by mouth every 6 (six) hours as needed for nausea or vomiting. 01/20/19  Yes Owens Shark, NP  senna (SENOKOT) 8.6 MG tablet Take 1 tablet by mouth as needed for constipation.   Yes [provider]     Vital Signs: BP (!) 134/59 (BP Location: Left Arm)   Pulse 73   Temp 98.8 F (37.1 C) (Oral)   Resp 17   Ht 5\' 7"  (1.702 m)   Wt 169 lb 12.1 oz (77 kg)   SpO2 (!) 88%   BMI 26.59 kg/m   Physical Exam awake, alert.  G-tube intact, insertion site okay, some old blood around insertion site, mildly tender to palpation.  No significant abdominal distention, abdomen soft  Imaging: IR GASTROSTOMY TUBE MOD SED  Result Date: 07/26/2019 INDICATION: History of esophageal cancer. Please place percutaneous gastrostomy tube placement for enteric nutrition supplementation purposes. EXAM: PUSH GASTROSTOMY TUBE PLACEMENT COMPARISON:  Chest CT-07/19/2018 MEDICATIONS: Patient is currently admitted to the hospital and receiving intravenous antibiotics; Antibiotics were administered within 1 hour of the procedure. CONTRAST:  10 mL of Isovue 300 administered into the gastric lumen. ANESTHESIA/SEDATION: Moderate (conscious) sedation was employed during this procedure. A total of Versed 2 mg and Fentanyl 100 mcg was administered intravenously. Moderate Sedation Time: 17 minutes. [name] level of consciousness and vital signs were monitored continuously by radiology nursing throughout the procedure  under my direct supervision. FLUOROSCOPY TIME:  2 minutes, 36 seconds ([id number] mGy) COMPLICATIONS: None immediate. PROCEDURE: Informed written consent was obtained from patient following explanation of the procedure, risks, benefits and alternatives. A time out was performed prior to the initiation of the procedure. Ultrasound scanning was performed to demarcate the  edge of the left lobe of the liver. Maximal barrier sterile technique utilized including caps, mask, sterile gowns, sterile gloves, large sterile drape, hand hygiene and Betadine prep. The left upper quadrant was sterilely prepped and draped. A oral gastric catheter was inserted into the stomach under fluoroscopy. The existing nasogastric feeding tube was removed. The left costal margin and barium opacified transverse colon were identified and avoided. Air was injected into the stomach for insufflation and visualization under fluoroscopy. Under sterile conditions and local anesthesia, 3 T tacks were utilized to pexy the anterior aspect of the stomach against the ventral abdominal wall. Contrast injection confirmed appropriate positioning of each of the T tacks. An incision was made between the T tacks and a 17 gauge trocar needle was utilized to access the stomach. Needle position was confirmed within the stomach with aspiration of air and injection of a small amount of contrast. An Amplatz wire was coiled within the gastric antrum. Next, under intermittent fluoroscopic guidance, the track was serially dilated ultimately allowing placement of a 22 French peel-away sheath. Next, a 52 French balloon retention gastrostomy tube was placed and the retention balloon was insufflated with a mixture of dilute saline and contrast and pulled taut against the anterior wall of the stomach. The external disc was cinched. Contrast injection confirms positioning within the stomach. Several spot radiographic images were obtained in various obliquities for documentation. The patient tolerated procedure well without immediate post procedural complication. FINDINGS: After successful fluoroscopic guided placement, the gastrostomy tube is appropriately positioned with internal retention balloon against the ventral aspect of the gastric lumen. IMPRESSION: Successful fluoroscopic insertion of an 32 French balloon retention gastrostomy  tube. The gastrostomy may be used immediately for medication administration and in 24 hrs for the initiation of feeds. Electronically Signed   By: Sandi Mariscal M.D.   On: 07/26/2019 13:12    Labs:  CBC: Recent Labs    07/23/19 0617 07/24/19 0551 07/25/19 0535 07/26/19 0600  WBC 2.5* 2.7* 2.5* 3.2*  HGB 5.3* 10.4* 10.6* 11.2*  HCT 17.7* 32.3* 33.8* 35.8*  PLT 51* 70* 78* 86*    COAGS: Recent Labs    02/02/19 0820 07/19/19 1053  INR 1.1 1.2    BMP: Recent Labs    07/19/19 1048 07/20/19 0611 07/22/19 0539 07/26/19 0600  NA 138 139 138 142  K 3.7 3.8 3.8 3.6  CL 107 107 105 106  CO2 22 24 25 26   GLUCOSE 105* 88 92 86  BUN 18 15 15 19   CALCIUM 8.7* 8.4* 8.3* 8.7*  CREATININE 0.96 0.95 0.92 0.98  GFRNONAA >60 >60 >60 >60  GFRAA >60 >60 >60 >60    LIVER FUNCTION TESTS: Recent Labs    06/09/19 1415 06/24/19 1335 07/07/19 1005 07/19/19 1048  BILITOT 0.7 0.7 0.7 1.1  AST 41 30 29 27   ALT 17 15 15 15   ALKPHOS 195* 201* 218* 192*  PROT 6.4* 6.8 6.8 7.1  ALBUMIN 2.9* 2.8* 2.7* 2.9*    Assessment and Plan: Patient with history of esophageal cancer, dysphagia, poor nutritional status; status post 22 French balloon retention gastrostomy tube placement on 5/11; afebrile, no new labs; okay to use gastrostomy tube as  needed for meds and feeds.   Electronically Signed: D. Rowe Robert, PA-C 07/27/2019, 10:50 AM   I spent a total of 15 minutes at the the patient's bedside AND on the patient's hospital floor or unit, greater than 50% of which was counseling/coordinating care for percutaneous gastrostomy tube    Patient ID: Curtis Newcomer., male   DOB: 09-11-1934, 85 y.o.   MRN: [id number]

## 2019-07-27 NOTE — Progress Notes (Signed)
Pt discharged home with daughter in stable condition. Discharge instructions provided to patient and daughter concerning tube bolus feeds and med adminstration via tube. Both verbalized understanding and provided return demonstration. No immediate questions or concerns. Script sent to pharmacy of choice. Pt discharged from unit via wheelchair.

## 2019-07-27 NOTE — Progress Notes (Signed)
Palliative Care Progress Note  Patient discussed at [facility]- he has been approved for hospice services with radiation and PEG tube for nutrition in setting of recurrent esophageal cancer with tumor erosion into esphagus. The goals of radiation are to prevent and control hemoptysis- he is at very high risk for sudden airway hemorrage and significant aspiration event- he will need to meet his nutritional requirements during radiation so for this reason PEG is reasonable and his goal is to return home with best possible QOL. Will continue to follow and assist as needed with hospice transition of care following this acute hospitalization.  Lane Hacker, DO Palliative Medicine

## 2019-07-27 NOTE — Progress Notes (Signed)
Nutrition Follow-up  DOCUMENTATION CODES:   Non-severe (moderate) malnutrition in context of chronic illness  INTERVENTION:   Bolus feeds: -Initiate Osmolite 1.5, 237 ml (1 carton) QID  -Advance as tolerated to goal rate of 355 ml (1.5 cartons) QID. Free water flush with 60 ml before and after each bolus. Will need additional free water of 200 ml BID to meet fluid needs. This provides 2130 kcals, 89g protein and 1966 ml H2O daily.  -Recommend re-weigh patient (last weighed 5/4). Will order with TF protocol.  Recommend monitoring tolerance and for refeeding syndrome given poor PO intakes since admission.  NEW NUTRITION DIAGNOSIS:   Moderate Malnutrition related to chronic illness, cancer and cancer related treatments as evidenced by percent weight loss, energy intake < 75% for > 7 days, moderate fat depletion, mild muscle depletion.  GOAL:   Patient will meet greater than or equal to 90% of their needs  Not meeting.  MONITOR:   TF tolerance, Labs, Weight trends, Diet advancement, Skin, I & O's  REASON FOR ASSESSMENT:   Consult Assessment of nutrition requirement/status, Enteral/tube feeding initiation and management  ASSESSMENT:   85 year old male with past medical history of COPD, CAD, GERD, paroxysmal atrial fibrillation s/p pacemaker, PVD, history of prostate cancer, recurrent esophageal cancer currently on nivolumab presented with worsening hemoptysis and hematemesis, reports having a hard time eating, choking sensation, and increasing dysphagia.\  Patient admitted with hemoptysis and radiation therapy.  Patient was consuming 25-100% of meals in last RD assessment. Pt now unable to swallow pills or food, even Ensure now. Pt to discharge home with hospice. PEG placed 5/11. Per IR, now ready to use. Will place on bolus feed regimen. Would monitor for tolerance and possible refeeding syndrome.  Patient is followed by [facility] who can assist with advancement of  bolus regimen.  Pt meets criteria for moderate malnutrition given percentage weight loss (7.9% x 3.5 months) and mild/moderate fat and muscle depletions.  No weights have been measured since admission on 5/4.  I/Os: +3.4L since admit  Labs reviewed. Medications: IV Zosyn  Diet Order:   Diet Order            Diet clear liquid Room service appropriate? Yes; Fluid consistency: Thin  Diet effective now              EDUCATION NEEDS:   Education needs have been addressed  Skin:  Skin Assessment: Reviewed RN Assessment  Last BM:  5/11  Height:   Ht Readings from Last 1 Encounters:  07/19/19 5\' 7"  (1.702 m)    Weight:   Wt Readings from Last 1 Encounters:  07/19/19 77 kg    Ideal Body Weight:     BMI:  Body mass index is 26.59 kg/m.  Estimated Nutritional Needs:   Kcal:  [id number]  Protein:  100-115  Fluid:  >/= 2 L/day   Clayton Bibles, MS, RD, LDN Inpatient Clinical Dietitian Contact information available via Amion

## 2019-07-27 NOTE — Progress Notes (Signed)
[place] [facility] Therapy Treatment Record Phone [phone]   Radiation Therapy was administered to [name] on: 07/27/2019  10:17 AM and was treatment # 3 out of a planned course of 10 treatments.  Radiation Treatment  1). Beam photons with 6-10 energy  2). Brachytherapy None  3). Stereotactic Radiosurgery None  4). Other Radiation None     [name] A [name], RT (T)

## 2019-07-27 NOTE — Discharge Summary (Addendum)
. Physician Discharge Summary  [name]. [id number] DOB: Mar 12, 1935 DOA: 07/19/2019  PCP: [name], MD  Admit date: 07/19/2019 Discharge date: 07/27/2019  Admitted From: Home Disposition:  Discharged to home with hospice.  Recommendations for Outpatient Follow-up:  1. Follow up with PCP in 1-2 weeks 2. Follow up with hospice team at discharge.  Discharge Condition: Stable  CODE STATUS: DNR   Brief/Interim Summary: 85 year old male with COPD, CAD, GERD, paroxysmal A. Fib,SSS s/ppacemaker, PVD, history of prostate CA, esophageal CA presented with worsening hemoptysis and hematemesis. No fevers or chills. Has hard time eating, choking sensation and increasing dysphagia. Feels dizzy with ambulation ED Course: Afebrile, hemodynamically stable. CT Chest showed progressive enlargement of the necrotic mass associated with the upper esophagus, now measuring 4.5 x 3.1 x 5.9 cm, previously 3.3 x 2.6 x 4.1 cm, with new invasion of the posterior wall of the trachea.Ct also reported emphysema/ unchanged cirrhosis/bilateral nonobstructive nephrolithiasis.Oncology was consultedby ED whorecommended XRT Hospital course: Patient admitted to [facility] GI/Oncology consultations.  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 Benay Spice.      - CT showed enlargement of the upper esophageal mass with evidence of tracheal invasion.     - Per GI, no role of endoscopy at this time and recommended oncology and XRT.      - Oncology recommendingpalliative radiationas doesn't appear to be amendable for embolization and unresponsive to systemic therapy.     - Radiation oncology consulted, CT simulation on 5/6, rec'd 5 weeks of definitive XRT starting 5/10.      - Plan for d/chome with home hospice     - He will be able to arrange transportation with his family     - Patient has outpatient follow-up with ENT at [facility] next week.     - 5/12: He is stable for discharge this morning. He will be discharged to home with hospice.   Acute on chronic anemia     - secondary to #1 andpossibly chemotherapy-induced pancytopenia     - s/p 1 unit packed RBCs on 5/5,2 units packed RBC on 5/8.     - Hgb stable, platelets improved  Oral lesions, mucositis     - Placed on nystatin S&S, fluconazole for 7 days     - states lidocaine solution help     - will go home with 3 more days of  Fluconazole; he is not tolerating the nystatin and no longer wishes to use it  Dysphagia     - Secondary to above      - Does not like full liquid diet, will advance to dysphagia 3 diet per SLPbut nurse reported difficult swallowing pills yesterday. Oral meds held.      - Remains on IV meds including Protonix, flucanozole and metoprolol; can now  be switched to per tube.     - patient has chosen home hospice; d/w palliative care MD who again d/w patient regarding care goals and stated okay for proceeding with PEG as planned     - 5/12: now s/p PEG placement, can start meds through tube and bolus feeds through tube     - Dietary recs are as follows:          - Bolus feeds:         - Initiate Osmolite 1.5, 237 ml (1 carton) QID         - Advance as tolerated to goal rate of 355 ml (1.5 cartons) QID. Free water flush with 60 ml before and after each bolus. Will  need additional free water of 200 ml BID to meet fluid needs. This provides 2130 kcals, 89g protein and 1966 ml H2O daily.  Fevers/possible developing aspiration PNA     - Patient was spiking low-grade fevers, rhonchorous lung sounds in the last 2 days.      - Chest x-ray with no active disease.     - Continue IV Zosyn today, transition to oral Augmentin(via PEG)at the time of discharge     - 5/12: will discharge with augmentin through 5/13  CAD status post CABG History of paroxysmal A. fib History of aortic stenosis History of pacemaker Hx of AAA     - Currently normal sinus rhythm.     - BP currently stable     - continue beta-blocker at discharge, hold off on losartan  GERD     - Continue PPI  OSA     - CPAP if able to tolerate, refusing this time  Generalized debility     - Patient reports that he is feeling weak, will have PT/OT evaluation for assessment     - pt has chosen hospice  Respiratory distress Hypoxic respiratory failure d/t above      - will go home on 2L [place]  Discharge Instructions   Allergies as of 07/27/2019   No Known Allergies     Medication List    STOP taking these medications   atorvastatin 40 MG tablet Commonly known as: LIPITOR   losartan 25 MG tablet Commonly known as: COZAAR   pantoprazole 40 MG tablet Commonly known as: PROTONIX   senna 8.6 MG tablet Commonly known as: SENOKOT     TAKE these medications   acetaminophen 325 MG tablet Commonly known as: TYLENOL Place 2 tablets (650 mg total) into feeding tube 2 (two) times daily. What changed: how to take this   amoxicillin-clavulanate 250-62.5 MG/5ML suspension Commonly known as: AUGMENTIN Take 10 mLs (500 mg total) by mouth 2 (two) times daily for 3 days.   bisacodyl 10 MG suppository Commonly known as: DULCOLAX Place 1 suppository (10 mg total) rectally daily as needed for up to 12 days for mild constipation or moderate constipation.   esomeprazole 20 MG  packet Commonly known as: NexIUM Take 20 mg by mouth daily before breakfast. Take per tube, not by mouth.   fluconazole 10 MG/ML suspension Commonly known as: DIFLUCAN Take 10 mLs (100 mg total) by mouth daily for 3 days.   HYDROcodone-acetaminophen 7.5-325 mg/15 ml solution Commonly known as: HYCET Place 15 mLs into feeding tube every 6 (six) hours as needed for up to 7 days for moderate pain or severe pain. What changed:   how much to take  how to take this  reasons to take this  lidocaine-prilocaine cream Commonly known as: EMLA Apply to port site one hour prior to use   metoprolol tartrate 25 MG tablet Commonly known as: LOPRESSOR Place 1 tablet (25 mg total) into feeding tube 2 (two) times daily. What changed: how to take this   nitroGLYCERIN 0.4 MG SL tablet Commonly known as: NITROSTAT PLACE 1 TABLET UNDER THE TONGUE EVERY 5 MINUTES AS NEEDED FOR CHEST PAIN UP TO 3 DOSES, IF SYMPTOMS PERSIST CALL 911 What changed: See the new instructions.   NON FORMULARY Apply 1 application topically See admin instructions. Tridema pain cream: Rub into both shoulders 2 times a day   prochlorperazine 5 MG tablet Commonly known as: COMPAZINE Place 1 tablet (5 mg total) into feeding tube every 6 (six) hours as needed for nausea or vomiting. What changed: how to take this       No Known Allergies  Consultations:  Oncology  Radiation Oncology  Internal Medicine  GI   Procedures/Studies: CT Chest W Contrast  Result Date: 07/19/2019 CLINICAL DATA:  Worsening hemoptysis over the past 2 weeks. History of esophageal cancer. EXAM: CT CHEST WITH CONTRAST TECHNIQUE: Multidetector CT imaging of the chest was performed during intravenous contrast administration. CONTRAST:  92mL OMNIPAQUE IOHEXOL 300 MG/ML  SOLN COMPARISON:  CT chest dated May 20, 2019. FINDINGS: Cardiovascular: Normal heart size status post CABG and AVR. No thoracic aortic aneurysm or dissection. Coronary, aortic  arch, and branch vessel atherosclerotic vascular disease. No central pulmonary embolism. Unchanged right chest wall pacemaker and left chest wall port catheter. Mediastinum/Nodes: Progressive enlargement of the ill-defined mass associated with the esophagus measuring 4.5 x 3.1 x 5.9 cm, previously 3.3 x 2.6 x 4.1 cm. The mass now invades the posterior wall of the trachea (series 4, image 87; series 2, images 27-35). There is some new fluid signal and gas within the mass that may reflect necrosis. No enlarged mediastinal, hilar, or axillary lymph nodes. The thyroid gland is unremarkable. Lungs/Pleura: Centrilobular paraseptal emphysema again noted. Unchanged juxtapleural scarring in the posterior right upper lobe. Chronic mild central peribronchial thickening. No focal consolidation, pleural effusion, or pneumothorax. Previously seen 10 mm nodular density in the left upper lobe has decreased in size, now measuring 6 mm (series 7, image 74). Unchanged 5 mm ground-glass nodule in the left upper lobe (series 7, image 63), stable since March 2017. Small calcified granuloma in the left upper lobe again noted. No new pulmonary nodule. Upper Abdomen: No acute abnormality. Unchanged cirrhosis. Unchanged bilateral nonobstructive renal calculi. Musculoskeletal: No chest wall abnormality. No acute or significant osseous findings. IMPRESSION: 1. Progressive enlargement of the necrotic mass associated with the upper esophagus, now measuring 4.5 x 3.1 x 5.9 cm, previously 3.3 x 2.6 x 4.1 cm, with new invasion of the posterior wall of the trachea. 2. Previously seen 10 mm nodular density in the left upper lobe has decreased in size, now measuring 6 mm, consistent with resolving infection or inflammation. 3. Unchanged cirrhosis. 4. Unchanged bilateral nonobstructive nephrolithiasis. 5. Aortic Atherosclerosis (ICD10-I70.0) and Emphysema (ICD10-J43.9). Electronically Signed   By: Titus Dubin M.D.   On: 07/19/2019 13:42   IR  GASTROSTOMY TUBE MOD SED  Result Date: 07/26/2019 INDICATION: History of esophageal cancer. Please place percutaneous gastrostomy tube placement for enteric nutrition supplementation purposes. EXAM: PUSH GASTROSTOMY TUBE PLACEMENT COMPARISON:  Chest CT-07/19/2018 MEDICATIONS: Patient is currently admitted to the hospital and receiving intravenous antibiotics; Antibiotics were administered within 1 hour of the procedure. CONTRAST:  10 mL of Isovue 300 administered into  the gastric lumen. ANESTHESIA/SEDATION: Moderate (conscious) sedation was employed during this procedure. A total of Versed 2 mg and Fentanyl 100 mcg was administered intravenously. Moderate Sedation Time: 17 minutes. [name] level of consciousness and vital signs were monitored continuously by radiology nursing throughout the procedure under my direct supervision. FLUOROSCOPY TIME:  2 minutes, 36 seconds ([id number] mGy) COMPLICATIONS: None immediate. PROCEDURE: Informed written consent was obtained from patient following explanation of the procedure, risks, benefits and alternatives. A time out was performed prior to the initiation of the procedure. Ultrasound scanning was performed to demarcate the edge of the left lobe of the liver. Maximal barrier sterile technique utilized including caps, mask, sterile gowns, sterile gloves, large sterile drape, hand hygiene and Betadine prep. The left upper quadrant was sterilely prepped and draped. A oral gastric catheter was inserted into the stomach under fluoroscopy. The existing nasogastric feeding tube was removed. The left costal margin and barium opacified transverse colon were identified and avoided. Air was injected into the stomach for insufflation and visualization under fluoroscopy. Under sterile conditions and local anesthesia, 3 T tacks were utilized to pexy the anterior aspect of the stomach against the ventral abdominal wall. Contrast injection confirmed appropriate positioning of each of the T  tacks. An incision was made between the T tacks and a 17 gauge trocar needle was utilized to access the stomach. Needle position was confirmed within the stomach with aspiration of air and injection of a small amount of contrast. An Amplatz wire was coiled within the gastric antrum. Next, under intermittent fluoroscopic guidance, the track was serially dilated ultimately allowing placement of a 22 French peel-away sheath. Next, a 59 French balloon retention gastrostomy tube was placed and the retention balloon was insufflated with a mixture of dilute saline and contrast and pulled taut against the anterior wall of the stomach. The external disc was cinched. Contrast injection confirms positioning within the stomach. Several spot radiographic images were obtained in various obliquities for documentation. The patient tolerated procedure well without immediate post procedural complication. FINDINGS: After successful fluoroscopic guided placement, the gastrostomy tube is appropriately positioned with internal retention balloon against the ventral aspect of the gastric lumen. IMPRESSION: Successful fluoroscopic insertion of an 27 French balloon retention gastrostomy tube. The gastrostomy may be used immediately for medication administration and in 24 hrs for the initiation of feeds. Electronically Signed   By: Sandi Mariscal M.D.   On: 07/26/2019 13:12   DG Chest Port 1 View  Result Date: 07/23/2019 CLINICAL DATA:  Hemoptysis. Personal history of esophageal carcinoma. EXAM: PORTABLE CHEST 1 VIEW COMPARISON:  07/19/2019 FINDINGS: The heart size and mediastinal contours are within normal limits. Prior CABG and aortic valve replacement again noted. Dual lead transvenous pacemaker remains in appropriate position. Left-sided power port also remains in expected position. Pulmonary hyperinflation again seen, consistent with COPD. No evidence of pulmonary infiltrate or edema. No evidence of pneumothorax or pleural effusion.  IMPRESSION: Stable exam. COPD. No active disease. Electronically Signed   By: Marlaine Hind M.D.   On: 07/23/2019 10:52   DG Chest Port 1 View  Result Date: 07/19/2019 CLINICAL DATA:  Cough, history of throat cancer EXAM: PORTABLE CHEST 1 VIEW COMPARISON:  12/15/2018 FINDINGS: Left chest wall port catheter tip overlies SVC. No new consolidation or edema. No pleural effusion or pneumothorax. Stable cardiomediastinal contours with normal heart size. Valve replacement and right chest wall dual lead pacemaker noted. IMPRESSION: No acute process in the chest. Electronically Signed   By: Malachi Carl  Patel M.D.   On: 07/19/2019 12:11   DG Swallowing Func-Speech Pathology  Result Date: 07/21/2019 Objective Swallowing Evaluation: Type of Study: MBS-Modified Barium Swallow Study  Patient Details Name: [name]. MRN: [id number] Date of Birth: 1934/10/11 Today's Date: 07/21/2019 Time: SLP Start Time (ACUTE ONLY): 1235 -SLP Stop Time (ACUTE ONLY): 1300 SLP Time Calculation (min) (ACUTE ONLY): 25 min Past Medical History: Past Medical History: Diagnosis Date . AAA (abdominal aortic aneurysm) ([place])   a. Last duplex - 3.8cm 01/2013 - due 01/2014. [name] Acne rosacea  . Alcoholic hepatitis with ascites 08/28/2017 . Anginal pain ([place]) 1996 . Aortic stenosis, mild   Last echo 05/04/12 +LVH . Arthritis   "shoulders" (09/01/2017) . Back pain   hx epidural injections . Baker's cyst   Lt. [name] CAD (coronary artery disease)   a. s/p MI/PTCA - Cx 1994. b. s/p CABG x5 in 1997. c. Abnl nuc 2003- occ SVG-RCA, patent seq SVG-OM1-dCxOM, patentl LIMA-LAD, patent, SVG-diag. d. Normal nuc 09/2012. . Carotid artery disease ([place])   a. Duplex 01/2013: mildly abnormal. Mild plaque without significant diameter reduction. Repeat recommended 11/15. [name] COPD (chronic obstructive pulmonary disease) ([place])  . Dizziness and giddiness 08/04/2013 . GERD (gastroesophageal reflux disease)  . H/O: GI bleed   mild, neg. colonoscopy . Heart murmur  . HOH (hard of hearing)  .  HTN (hypertension)  . Hyperlipidemia  . Hypertensive heart disease  . Myocardial infarction ([place]) 1994 . OSA on CPAP  . OSA treated with BiPAP 11/20/2015  Severe with [facility] 38/hr . PAF (paroxysmal atrial fibrillation) ([place])   a. Remote per records.  . Pneumonia   history . Presence of permanent cardiac pacemaker   DOI 1999 with change out 2006; St Jude . Prostate cancer ([place]) ~ 2010  a. s/p radical retropubic prostatectomy with bilateral pelvic lymph node dissection . PVD (peripheral vascular disease) ([place])   a. Rt ext iliac stenosis, last PV cath 2003, moderate stenosis last Lower Ext Dopplers 12/16/11 - Rt. ABI 0.96  Lt ABI 1.0. . Sick sinus syndrome ([place])   a. placed 1999, gen change 2011 - St. Jude. . Sinus node dysfunction ([place]) 01/20/2013 . Syncope 11/28/97 . Thrombocytopenia (HCC)   chronic, mild . Valvular heart disease   a. Echo 04/2012: mild-mod AS, mild AI, mild MR. Past Surgical History: Past Surgical History: Procedure Laterality Date . APPENDECTOMY   . BIOPSY  10/20/2017  Procedure: BIOPSY;  Surgeon: Doran Stabler, MD;  Location: [facility];  Service: Gastroenterology;; . CARDIAC CATHETERIZATION  2003  VG to RCA occluded, other grafts patent . CARDIAC CATHETERIZATION N/A 05/04/2015  Procedure: Right/Left Heart Cath and Coronary/Graft Angiography;  Surgeon: Sherren Mocha, MD;  Location: [place];  Service: Cardiovascular;  Laterality: N/A; . CARDIAC CATHETERIZATION  09/01/2017 . CARDIAC VALVE REPLACEMENT   . CATARACT EXTRACTION W/ INTRAOCULAR LENS  IMPLANT, BILATERAL Bilateral  . COLONOSCOPY   . CORONARY ANGIOPLASTY  03/29/92  PTCA to LCX . CORONARY ARTERY BYPASS GRAFT  1997  LIMA-LAD; VG-diag; seq VG-1st OM & distal LCX; VG-RCA . CORONARY ATHERECTOMY N/A 09/01/2017  Procedure: CORONARY ATHERECTOMY;  Surgeon: Jettie Booze, MD;  Location: [facility];  Service: Cardiovascular;  Laterality: N/A; . CORONARY ATHERECTOMY  10/01/2017 . CORONARY ATHERECTOMY N/A 10/01/2017  Procedure:  CORONARY ATHERECTOMY;  Surgeon: Leonie Man, MD;  Location: [facility];  Service: Cardiovascular;  Laterality: N/A; . ESOPHAGOGASTRODUODENOSCOPY (EGD) WITH PROPOFOL N/A 10/20/2017  Procedure: ESOPHAGOGASTRODUODENOSCOPY (EGD) WITH PROPOFOL;  Surgeon: Wilfrid Lund  L III, MD;  Location: [facility];  Service: Gastroenterology;  Laterality: N/A; . ESOPHAGOGASTRODUODENOSCOPY (EGD) WITH PROPOFOL N/A 03/29/2018  Procedure: ESOPHAGOGASTRODUODENOSCOPY (EGD) WITH PROPOFOL;  Surgeon: Doran Stabler, MD;  Location: [facility];  Service: Gastroenterology;  Laterality: N/A; . INSERT / REPLACE / REMOVE PACEMAKER  11/28/1997  pacesetter--ERI 2011 . IR IMAGING GUIDED PORT INSERTION  02/02/2019 . LEFT HEART CATH N/A 09/01/2017  Procedure: Left Heart Cath;  Surgeon: Jettie Booze, MD;  Location: [facility];  Service: Cardiovascular;  Laterality: N/A; . LEFT HEART CATH AND CORS/GRAFTS ANGIOGRAPHY N/A 08/28/2017  Procedure: LEFT HEART CATH AND CORS/GRAFTS ANGIOGRAPHY;  Surgeon: Leonie Man, MD;  Location: [place] [facility];  Service: Cardiovascular;  Laterality: N/A; . PACEMAKER GENERATOR CHANGE  08/15/2009  St. Jude accent . SUPRAPUBIC PROSTATECTOMY   . TEE WITHOUT CARDIOVERSION N/A 06/12/2015  Procedure: TRANSESOPHAGEAL ECHOCARDIOGRAM (TEE);  Surgeon: Sherren Mocha, MD;  Location: [facility];  Service: Open Heart Surgery;  Laterality: N/A; . TONSILLECTOMY   . TRANSCATHETER AORTIC VALVE REPLACEMENT, TRANSFEMORAL N/A 06/12/2015  Procedure: TRANSCATHETER AORTIC VALVE REPLACEMENT, TRANSFEMORAL, possible transpical;  Surgeon: Sherren Mocha, MD;  Location: [place];  Service: Open Heart Surgery;  Laterality: N/A; HPI: Patient is 85 year old male with COPD, CAD, GERD, paroxysmal A. fib, pacemaker, PVD, history of prostate CA, esophageal CA presented with worsening hemoptysis and hematemesis.  No fevers or chills.  Has hard time eating, choking sensation and increasing dysphagia  Subjective: Pt reports coughing with  all PO intake (liquids and solids) Assessment / Plan / Recommendation CHL IP CLINICAL IMPRESSIONS 07/21/2019 Clinical Impression Pt was seen for a modified barium swallow study with PMH of esophageal cancer s/p XRT with recurrence/extention. 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 Pt demonstrated strong cough response t/o study, which was not correlative with penetration/aspiration instances. Pt demonstrates remarkable oropharyngeal swallow function for his advanced age and medical history. Oral phase was grossly WFL. Regular solids were not assessed, as his dentures were not available at the time. With soft solids, pt had good oral clearance and lingual control. Pharyngeally, pt demonstrates reduced laryngeal elevation, hyoid excursion, and epiglottic inversion, which ultimately resulted in trace penetration of thin liquids prior to the swallow and mild residue in valleculae with purees/solids after the swallow. Residue was improved with liquid wash/repeat swallow. Given sequential straw or cup sips, pt had stagnant trace penetration, so he is recommended to take single sips. Esophageal phase showed very mild retrograde bolus movement with thin liquids. At this time, pt is recommended for thin liquids and mechanical soft solids (regular once dentures are available), alternating liquids/solids, single sips. Of note, pt has plans to initiate upper esophageal XRT, which is very likely to impact swallow function. He should follow up with OP SLP regarding PO intake r/t this treatment. SLP Visit Diagnosis Dysphagia, oropharyngeal phase (R13.12) Impact on safety and function Moderate aspiration risk;Mild aspiration risk     Prognosis 07/21/2019 Prognosis for Safe Diet Advancement Guarded Barriers to Reach Goals Other (Comment) Barriers/Prognosis Comment guarded d/t advanced age and medical dx CHL IP DIET RECOMMENDATION 07/21/2019 SLP Diet Recommendations Thin liquid;Dysphagia 3 (Mech  soft) solids Liquid Administration via Straw;Cup Medication Administration Crushed with puree Compensations Small sips/bites;Follow solids with liquid   CHL IP OTHER RECOMMENDATIONS 07/21/2019 Recommended Consults ENT appt next week; recommend SLP as OP as well Oral Care Recommendations Oral care BID   CHL IP FOLLOW UP RECOMMENDATIONS 07/21/2019 Follow up Recommendations Outpatient SLP  No flowsheet data found.     CHL IP ORAL PHASE 07/21/2019 Oral Phase WFL Oral - Regular Deferred d/t dentures being upstairs  CHL IP PHARYNGEAL PHASE 07/21/2019 Pharyngeal Phase Impaired Pharyngeal- Thin Cup Reduced epiglottic inversion;Reduced laryngeal elevation;Reduced anterior laryngeal mobility;Penetration/Aspiration before swallow Pharyngeal Material enters airway, remains ABOVE vocal cords then ejected out Pharyngeal- Thin Straw Penetration/Aspiration before swallow;Reduced laryngeal elevation;Reduced airway/laryngeal closure;Reduced anterior laryngeal mobility;Reduced epiglottic inversion Pharyngeal Material enters airway, remains ABOVE vocal cords then ejected out;Material enters airway, remains ABOVE vocal cords and not ejected out Pharyngeal- Puree Reduced pharyngeal peristalsis;Reduced epiglottic inversion;Pharyngeal residue - valleculae Pharyngeal Material does not enter airway Pharyngeal- Mechanical Soft WFL;Reduced pharyngeal peristalsis;Pharyngeal residue - valleculae  CHL IP CERVICAL ESOPHAGEAL PHASE 07/21/2019 Cervical Esophageal Phase [facility] P. Isenhour, M.S., CCC-SLP Speech-Language Pathologist Acute Rehabilitation Services Pager: [place] 07/21/2019, 1:59 PM                  Subjective: "I get to go home today?"  Discharge Exam: Vitals:   07/27/19 0647 07/27/19 0918  BP: (!) 134/59   Pulse: 73   Resp: 17   Temp: 98.8 F (37.1 C)   SpO2: 91% (!) 88%   Vitals:   07/26/19 1743 07/26/19 2208 07/27/19 0647 07/27/19 0918  BP: 120/61 130/68 (!) 134/59   Pulse: 75 80 73   Resp:  17  17   Temp:  97.8 F (36.6 C) 98.8 F (37.1 C)   TempSrc:  Oral Oral   SpO2:  95% 91% (!) 88%  Weight:      Height:        General: 85 y.o. male resting in bed in NAD Cardiovascular: RRR, +S1, S2, no m/g/r, equal pulses throughout Respiratory:Upper airway transmission, no w/r/r, normal WOB GI: BS+, NDNT, no masses noted, PEG in place MSK: No e/c/c Neuro: alert to name, follows commands Psyc: Appropriate interaction and affect, calm/cooperative  The results of significant diagnostics from this hospitalization (including imaging, microbiology, ancillary and laboratory) are listed below for reference.     Microbiology: Recent Results (from the past 240 hour(s))  Respiratory Panel by RT PCR (Flu A&B, Covid) - Nasopharyngeal Swab     Status: None   Collection Time: 07/19/19 10:54 AM   Specimen: Nasopharyngeal Swab  Result Value Ref Range Status   SARS Coronavirus 2 by RT PCR NEGATIVE NEGATIVE Final    Comment: (NOTE) SARS-CoV-2 target nucleic acids are NOT DETECTED. The SARS-CoV-2 RNA is generally detectable in upper respiratoy specimens during the acute phase of infection. The lowest concentration of SARS-CoV-2 viral copies this assay can detect is 131 copies/mL. A negative result does not preclude SARS-Cov-2 infection and should not be used as the sole basis for treatment or other patient management decisions. A negative result may occur with  improper specimen collection/handling, submission of specimen other than nasopharyngeal swab, presence of viral mutation(s) within the areas targeted by this assay, and inadequate number of viral copies (<131 copies/mL). A negative result must be combined with clinical observations, patient history, and epidemiological information. The expected result is Negative. Fact Sheet for Patients:  PinkCheek.be Fact Sheet for Healthcare Providers:  GravelBags.it This test is not yet ap  proved or cleared by the [place] FDA and  has been authorized for detection and/or diagnosis of SARS-CoV-2 by FDA under an Emergency Use Authorization (EUA). This EUA will remain  in effect (meaning this test can be used) for the duration of the COVID-19 declaration under Section 564(b)(1) of the  Act, 21 U.S.C. section 360bbb-3(b)(1), unless the authorization is terminated or revoked sooner.    Influenza A by PCR NEGATIVE NEGATIVE Final   Influenza B by PCR NEGATIVE NEGATIVE Final    Comment: (NOTE) The Xpert Xpress SARS-CoV-2/FLU/RSV assay is intended as an aid in  the diagnosis of influenza from Nasopharyngeal swab specimens and  should not be used as a sole basis for treatment. Nasal washings and  aspirates are unacceptable for Xpert Xpress SARS-CoV-2/FLU/RSV  testing. Fact Sheet for Patients: PinkCheek.be Fact Sheet for Healthcare Providers: GravelBags.it This test is not yet approved or cleared by the [place] FDA and  has been authorized for detection and/or diagnosis of SARS-CoV-2 by  FDA under an Emergency Use Authorization (EUA). This EUA will remain  in effect (meaning this test can be used) for the duration of the  Covid-19 declaration under Section 564(b)(1) of the Act, 21  U.S.C. section 360bbb-3(b)(1), unless the authorization is  terminated or revoked. Performed at [facility], [place] [address], [place], [place] [address]   Culture, blood (routine x 2)     Status: None (Preliminary result)   Collection Time: 07/23/19  8:05 AM   Specimen: BLOOD  Result Value Ref Range Status   Specimen Description   Final    BLOOD RIGHT ARM Performed at [facility] [address] [place], [place] [address]    Special Requests   Final    BOTTLES DRAWN AEROBIC AND ANAEROBIC Blood Culture adequate volume Performed at [facility] [address],  [place], [place] [address]    Culture   Final    NO GROWTH 4 DAYS Performed at [facility], [place] [address] [place], [place] [address]    Report Status PENDING  Incomplete  Culture, blood (routine x 2)     Status: None (Preliminary result)   Collection Time: 07/23/19  8:10 AM   Specimen: BLOOD  Result Value Ref Range Status   Specimen Description   Final    BLOOD LEFT ARM Performed at [facility] [address], [place], [place] [address]    Special Requests   Final    BOTTLES DRAWN AEROBIC AND ANAEROBIC Blood Culture adequate volume Performed at [facility] [address], [place], [place] [address]    Culture   Final    NO GROWTH 4 DAYS Performed at [facility], [place] [address] [place], [place] [address]    Report Status PENDING  Incomplete  Surgical pcr screen     Status: None   Collection Time: 07/26/19  6:53 AM   Specimen: Nasal Mucosa; Nasal Swab  Result Value Ref Range Status   MRSA, PCR NEGATIVE NEGATIVE Final   Staphylococcus aureus NEGATIVE NEGATIVE Final    Comment: (NOTE) The Xpert SA Assay (FDA approved for NASAL specimens in patients 93 years of age and older), is one component of a comprehensive surveillance program. It is not intended to diagnose infection nor to guide or monitor treatment. Performed at [facility], [place] [address], [place],  [address]      Labs: BNP (last 3 results) Recent Labs    12/16/18 0234  BNP 123XX123*   Basic Metabolic Panel: Recent Labs  Lab 07/22/19 0539 07/26/19 0600  NA 138 142  K 3.8 3.6  CL 105 106  CO2 25 26  GLUCOSE 92 86  BUN 15 19  CREATININE 0.92 0.98  CALCIUM 8.3* 8.7*  Liver Function Tests: No results for input(s): AST, ALT, ALKPHOS, BILITOT, PROT, ALBUMIN in the last 168 hours. No results for input(s): LIPASE, AMYLASE in the last 168 hours. No results for input(s): AMMONIA in the last 168 hours. CBC: Recent Labs  Lab  07/22/19 0539 07/23/19 0617 07/24/19 0551 07/25/19 0535 07/26/19 0600  WBC 3.9* 2.5* 2.7* 2.5* 3.2*  NEUTROABS  --   --   --   --  2.3  HGB 9.3* 5.3* 10.4* 10.6* 11.2*  HCT 29.3* 17.7* 32.3* 33.8* 35.8*  MCV 93.0 97.3 92.0 92.9 94.5  PLT 91* 51* 70* 78* 86*   Cardiac Enzymes: No results for input(s): CKTOTAL, CKMB, CKMBINDEX, TROPONINI in the last 168 hours. BNP: Invalid input(s): POCBNP CBG: No results for input(s): GLUCAP in the last 168 hours. D-Dimer No results for input(s): DDIMER in the last 72 hours. Hgb A1c No results for input(s): HGBA1C in the last 72 hours. Lipid Profile No results for input(s): CHOL, HDL, LDLCALC, TRIG, CHOLHDL, LDLDIRECT in the last 72 hours. Thyroid function studies No results for input(s): TSH, T4TOTAL, T3FREE, THYROIDAB in the last 72 hours.  Invalid input(s): FREET3 Anemia work up No results for input(s): VITAMINB12, FOLATE, FERRITIN, TIBC, IRON, RETICCTPCT in the last 72 hours. Urinalysis    Component Value Date/Time   COLORURINE YELLOW 06/08/2015 1056   APPEARANCEUR CLEAR 06/08/2015 1056   LABSPEC 1.013 06/08/2015 1056   PHURINE 7.5 06/08/2015 1056   GLUCOSEU NEGATIVE 06/08/2015 1056   HGBUR TRACE (A) 06/08/2015 1056   BILIRUBINUR NEGATIVE 06/08/2015 1056   KETONESUR NEGATIVE 06/08/2015 1056   PROTEINUR NEGATIVE 06/08/2015 1056   UROBILINOGEN 0.2 09/26/2009 1906   NITRITE NEGATIVE 06/08/2015 1056   LEUKOCYTESUR NEGATIVE 06/08/2015 1056   Sepsis Labs Invalid input(s): PROCALCITONIN,  WBC,  LACTICIDVEN Microbiology Recent Results (from the past 240 hour(s))  Respiratory Panel by RT PCR (Flu A&B, Covid) - Nasopharyngeal Swab     Status: None   Collection Time: 07/19/19 10:54 AM   Specimen: Nasopharyngeal Swab  Result Value Ref Range Status   SARS Coronavirus 2 by RT PCR NEGATIVE NEGATIVE Final    Comment: (NOTE) SARS-CoV-2 target nucleic acids are NOT DETECTED. The SARS-CoV-2 RNA is generally detectable in upper  respiratoy specimens during the acute phase of infection. The lowest concentration of SARS-CoV-2 viral copies this assay can detect is 131 copies/mL. A negative result does not preclude SARS-Cov-2 infection and should not be used as the sole basis for treatment or other patient management decisions. A negative result may occur with  improper specimen collection/handling, submission of specimen other than nasopharyngeal swab, presence of viral mutation(s) within the areas targeted by this assay, and inadequate number of viral copies (<131 copies/mL). A negative result must be combined with clinical observations, patient history, and epidemiological information. The expected result is Negative. Fact Sheet for Patients:  PinkCheek.be Fact Sheet for Healthcare Providers:  GravelBags.it This test is not yet ap proved or cleared by the [place] FDA and  has been authorized for detection and/or diagnosis of SARS-CoV-2 by FDA under an Emergency Use Authorization (EUA). This EUA will remain  in effect (meaning this test can be used) for the duration of the COVID-19 declaration under Section 564(b)(1) of the Act, 21 U.S.C. section 360bbb-3(b)(1), unless the authorization is terminated or revoked sooner.    Influenza A by PCR NEGATIVE NEGATIVE Final   Influenza B by PCR NEGATIVE NEGATIVE Final    Comment: (NOTE) The Xpert Xpress SARS-CoV-2/FLU/RSV assay is intended as an  aid in  the diagnosis of influenza from Nasopharyngeal swab specimens and  should not be used as a sole basis for treatment. Nasal washings and  aspirates are unacceptable for Xpert Xpress SARS-CoV-2/FLU/RSV  testing. Fact Sheet for Patients: PinkCheek.be Fact Sheet for Healthcare Providers: GravelBags.it This test is not yet approved or cleared by the [place] FDA and  has been authorized for  detection and/or diagnosis of SARS-CoV-2 by  FDA under an Emergency Use Authorization (EUA). This EUA will remain  in effect (meaning this test can be used) for the duration of the  Covid-19 declaration under Section 564(b)(1) of the Act, 21  U.S.C. section 360bbb-3(b)(1), unless the authorization is  terminated or revoked. Performed at [facility], [place] [address], [place], [place] [address]   Culture, blood (routine x 2)     Status: None (Preliminary result)   Collection Time: 07/23/19  8:05 AM   Specimen: BLOOD  Result Value Ref Range Status   Specimen Description   Final    BLOOD RIGHT ARM Performed at [facility] [address], [place], [place] [address]    Special Requests   Final    BOTTLES DRAWN AEROBIC AND ANAEROBIC Blood Culture adequate volume Performed at [facility] [address], [place], [place] [address]    Culture   Final    NO GROWTH 4 DAYS Performed at [facility], [place] [address] [place], [place] [address]    Report Status PENDING  Incomplete  Culture, blood (routine x 2)     Status: None (Preliminary result)   Collection Time: 07/23/19  8:10 AM   Specimen: BLOOD  Result Value Ref Range Status   Specimen Description   Final    BLOOD LEFT ARM Performed at [facility] [address] [place], [place] [address]    Special Requests   Final    BOTTLES DRAWN AEROBIC AND ANAEROBIC Blood Culture adequate volume Performed at [facility] [address] [place], [place] [address]    Culture   Final    NO GROWTH 4 DAYS Performed at [facility], [place] [address], [place], [place] [address]    Report Status PENDING  Incomplete  Surgical pcr screen     Status: None   Collection Time: 07/26/19  6:53 AM   Specimen: Nasal Mucosa; Nasal Swab  Result Value Ref Range Status   MRSA, PCR NEGATIVE NEGATIVE Final   Staphylococcus aureus NEGATIVE NEGATIVE  Final    Comment: (NOTE) The Xpert SA Assay (FDA approved for NASAL specimens in patients 67 years of age and older), is one component of a comprehensive surveillance program. It is not intended to diagnose infection nor to guide or monitor treatment. Performed at [facility], [place] [address] [place], [place] [address]      Time coordinating discharge: Over 35 minutes  SIGNED:   Jonnie Finner, DO  [facility] 07/27/2019, 1:12 PM   If 7PM-7AM, please contact night-coverage [email or web address]

## 2019-07-28 ENCOUNTER — Telehealth: Payer: Self-pay | Admitting: *Deleted

## 2019-07-28 ENCOUNTER — Inpatient Hospital Stay: Admitting: Nurse Practitioner

## 2019-07-28 ENCOUNTER — Other Ambulatory Visit: Payer: Self-pay

## 2019-07-28 ENCOUNTER — Ambulatory Visit
Admission: RE | Admit: 2019-07-28 | Discharge: 2019-07-28 | Disposition: A | Discharge status: Home / Self Care | Source: Ambulatory Visit | Attending: Radiation Oncology | Admitting: Radiation Oncology

## 2019-07-28 DIAGNOSIS — C154 Malignant neoplasm of middle third of esophagus: Secondary | ICD-10-CM | POA: Diagnosis present

## 2019-07-28 DIAGNOSIS — Z510 Encounter for antineoplastic radiation therapy: Secondary | ICD-10-CM | POA: Diagnosis present

## 2019-07-28 LAB — CULTURE, BLOOD (ROUTINE X 2)
Culture: NO GROWTH
Culture: NO GROWTH
Special Requests: ADEQUATE
Special Requests: ADEQUATE

## 2019-07-28 NOTE — Telephone Encounter (Signed)
Provided appointment to see Dr. Benay Spice on 08/05/19 at 1030 prior to his RT appointment. Today w/NP is cancelled since he just left the hospital.

## 2019-07-29 ENCOUNTER — Other Ambulatory Visit: Payer: Self-pay

## 2019-07-29 ENCOUNTER — Ambulatory Visit
Admission: RE | Admit: 2019-07-29 | Discharge: 2019-07-29 | Disposition: A | Discharge status: Home / Self Care | Source: Ambulatory Visit | Attending: Radiation Oncology | Admitting: Radiation Oncology

## 2019-07-29 ENCOUNTER — Telehealth: Payer: Self-pay | Admitting: Emergency Medicine

## 2019-07-29 DIAGNOSIS — Z510 Encounter for antineoplastic radiation therapy: Secondary | ICD-10-CM | POA: Diagnosis not present

## 2019-07-29 NOTE — Telephone Encounter (Signed)
Alert received for episode of AF with RVR that occurred on 07/28/19 and lasted 1 hr and 26 minutes, the ave v-rate was 200 bpm. Patient has hx of AF, no OAC , hx of hemoptysis and hospice care for esophygeal cancer. Patient denies symptoms during event. No missed doses of metoprolol.

## 2019-07-31 ENCOUNTER — Other Ambulatory Visit: Payer: Self-pay | Admitting: Oncology

## 2019-08-01 ENCOUNTER — Other Ambulatory Visit: Payer: Self-pay

## 2019-08-01 ENCOUNTER — Ambulatory Visit
Admission: RE | Admit: 2019-08-01 | Discharge: 2019-08-01 | Disposition: A | Discharge status: Home / Self Care | Source: Ambulatory Visit | Attending: Radiation Oncology | Admitting: Radiation Oncology

## 2019-08-01 DIAGNOSIS — Z510 Encounter for antineoplastic radiation therapy: Secondary | ICD-10-CM | POA: Diagnosis not present

## 2019-08-02 ENCOUNTER — Other Ambulatory Visit: Payer: Self-pay

## 2019-08-02 ENCOUNTER — Ambulatory Visit
Admission: RE | Admit: 2019-08-02 | Discharge: 2019-08-02 | Disposition: A | Discharge status: Home / Self Care | Source: Ambulatory Visit | Attending: Radiation Oncology | Admitting: Radiation Oncology

## 2019-08-02 DIAGNOSIS — Z510 Encounter for antineoplastic radiation therapy: Secondary | ICD-10-CM | POA: Diagnosis not present

## 2019-08-03 ENCOUNTER — Telehealth: Payer: Self-pay

## 2019-08-03 ENCOUNTER — Ambulatory Visit
Admission: RE | Admit: 2019-08-03 | Discharge: 2019-08-03 | Disposition: A | Discharge status: Home / Self Care | Source: Ambulatory Visit | Attending: Radiation Oncology | Admitting: Radiation Oncology

## 2019-08-03 ENCOUNTER — Other Ambulatory Visit: Payer: Self-pay

## 2019-08-03 ENCOUNTER — Telehealth: Payer: Self-pay | Admitting: *Deleted

## 2019-08-03 DIAGNOSIS — Z510 Encounter for antineoplastic radiation therapy: Secondary | ICD-10-CM | POA: Diagnosis not present

## 2019-08-03 NOTE — Telephone Encounter (Signed)
Informed family member of his appointment on 08/05/19 at 60 to see Dr. Benay Spice. Encouraged her to come to the appointment as well.

## 2019-08-03 NOTE — Telephone Encounter (Signed)
Nutrition Follow-up:  Patient with esophageal cancer.  Patient receiving radiation treatments at [facility].  Last treatment will be 08/05/19.  Noted hospital admission and PEG placed on 5/11 (22 french).  Noted patient is followed by hospice.    Spoke with daughter in law, Danton Clap as patient not able to talk.  Alice reports that patient is taking about 3-4 cartons of tube feeding (unsure the name) daily in feeding tube.  Says that patient gives tube feeding when he is hungry.  Thinks that he gives about 73ml of water (syringe full) before and after feeding.  Alice reports that he is not really eating or drinking anything through mouth.  Reports that he was constipated but now is "running off."  Unsure how much patient is having diarrhea but does not thiink it is bothersome.  Unsure if he took medicine for constipation and now having liquid stool.  Was on antibiotic for few days after discharge.     Medications: reviewed  Labs: reviewed  Anthropometrics:   Weight 169 lb 12.1 oz on 07/19/19 (hospital weight)   Estimated Energy Needs  Kcals: [id number] Protein: 100-115 g Fluid: > 2 L  NUTRITION DIAGNOSIS: Inadequate oral intake related to cancer as evidenced by PEG tube placement   INTERVENTION:  Discussed with daughter in law that 1 1/2 cartons of osmolite 1.5 QID would better meet nutritional needs.  Encouraged to have patient try adding 1/2 carton daily until able to reach goal.   Recommend increasing water flush to 170ml before and after each feeding QID.    Tube feeding will provide 2130 calories, 89 g protein and 1680 ml free water. Message sent to RN for Dr Benay Spice to confirm MD appt on [phone] as Danton Clap was not aware of it.    Contact information given to daughter in law.      MONITORING, EVALUATION, GOAL: weight trends, intake, TF tolerance, plan of care   NEXT VISIT: Tuesday, 5/25 phone f/u  Eliyohu Class B. Zenia Resides, [place], [place] Registered Dietitian [phone] (pager)

## 2019-08-04 ENCOUNTER — Other Ambulatory Visit: Payer: Self-pay

## 2019-08-04 ENCOUNTER — Ambulatory Visit
Admission: RE | Admit: 2019-08-04 | Discharge: 2019-08-04 | Disposition: A | Discharge status: Home / Self Care | Source: Ambulatory Visit | Attending: Radiation Oncology | Admitting: Radiation Oncology

## 2019-08-04 DIAGNOSIS — Z510 Encounter for antineoplastic radiation therapy: Secondary | ICD-10-CM | POA: Diagnosis not present

## 2019-08-05 ENCOUNTER — Other Ambulatory Visit: Payer: Self-pay

## 2019-08-05 ENCOUNTER — Inpatient Hospital Stay: Attending: Oncology | Admitting: Oncology

## 2019-08-05 ENCOUNTER — Encounter: Payer: Self-pay | Admitting: Radiation Oncology

## 2019-08-05 ENCOUNTER — Encounter: Payer: Self-pay | Admitting: Oncology

## 2019-08-05 ENCOUNTER — Ambulatory Visit
Admission: RE | Admit: 2019-08-05 | Discharge: 2019-08-05 | Disposition: A | Discharge status: Home / Self Care | Source: Ambulatory Visit | Attending: Radiation Oncology | Admitting: Radiation Oncology

## 2019-08-05 VITALS — BP 108/65 | HR 68 | Temp 97.3°F | Resp 17 | Ht 67.0 in | Wt 158.6 lb

## 2019-08-05 DIAGNOSIS — R1310 Dysphagia, unspecified: Secondary | ICD-10-CM | POA: Insufficient documentation

## 2019-08-05 DIAGNOSIS — C154 Malignant neoplasm of middle third of esophagus: Secondary | ICD-10-CM | POA: Insufficient documentation

## 2019-08-05 DIAGNOSIS — J449 Chronic obstructive pulmonary disease, unspecified: Secondary | ICD-10-CM | POA: Insufficient documentation

## 2019-08-05 DIAGNOSIS — C159 Malignant neoplasm of esophagus, unspecified: Secondary | ICD-10-CM | POA: Diagnosis not present

## 2019-08-05 DIAGNOSIS — R042 Hemoptysis: Secondary | ICD-10-CM | POA: Insufficient documentation

## 2019-08-05 DIAGNOSIS — Z8546 Personal history of malignant neoplasm of prostate: Secondary | ICD-10-CM | POA: Insufficient documentation

## 2019-08-05 DIAGNOSIS — I739 Peripheral vascular disease, unspecified: Secondary | ICD-10-CM | POA: Insufficient documentation

## 2019-08-05 DIAGNOSIS — I2510 Atherosclerotic heart disease of native coronary artery without angina pectoris: Secondary | ICD-10-CM | POA: Insufficient documentation

## 2019-08-05 DIAGNOSIS — Z931 Gastrostomy status: Secondary | ICD-10-CM | POA: Diagnosis not present

## 2019-08-05 DIAGNOSIS — B370 Candidal stomatitis: Secondary | ICD-10-CM | POA: Insufficient documentation

## 2019-08-05 DIAGNOSIS — Z950 Presence of cardiac pacemaker: Secondary | ICD-10-CM | POA: Insufficient documentation

## 2019-08-05 DIAGNOSIS — D649 Anemia, unspecified: Secondary | ICD-10-CM | POA: Diagnosis not present

## 2019-08-05 MED ORDER — FLUCONAZOLE 10 MG/ML PO SUSR: 100.0000 mg | Freq: Every day | ORAL | 0 refills | Status: DC

## 2019-08-05 NOTE — Progress Notes (Signed)
[facility] OFFICE PROGRESS NOTE   Diagnosis: Esophagus cancer INTERVAL HISTORY:   [name] was discharged in the hospital 07/27/2019 after admission with hemoptysis and persistent dysphagia.  He underwent placement of a gastrostomy feeding tube on 07/26/2019.  He is completing palliative radiation to the esophagus mass.  He reports the hemoptysis is much improved.  He has minimal hemoptysis at present.  He continues to have dysphagia.  No pain.  He is using the gastrostomy tube for fluids and feeding. He has enrolled in home hospice care.  The hospice nurses visiting twice weekly. Objective:  Vital signs in last 24 hours:  Blood pressure 108/65, pulse 68, temperature (!) 97.3 F (36.3 C), temperature source Temporal, resp. rate 17, height '5\' 7"'  (1.702 m), weight 158 lb 9.6 oz (71.9 kg), SpO2 97 %.    HEENT: Thick white coat over the tongue, no buccal thrush Resp: Lungs clear bilaterally Cardio: Regular rate and rhythm GI: No hepatomegaly, left upper quadrant gastrostomy tube site without evidence of infection Vascular: No leg edema  Portacath/PICC-without erythema  Lab Results:  Lab Results  Component Value Date   WBC 3.2 (L) 07/26/2019   HGB 11.2 (L) 07/26/2019   HCT 35.8 (L) 07/26/2019   MCV 94.5 07/26/2019   PLT 86 (L) 07/26/2019   NEUTROABS 2.3 07/26/2019    CMP  Lab Results  Component Value Date   NA 142 07/26/2019   K 3.6 07/26/2019   CL 106 07/26/2019   CO2 26 07/26/2019   GLUCOSE 86 07/26/2019   BUN 19 07/26/2019   CREATININE 0.98 07/26/2019   CALCIUM 8.7 (L) 07/26/2019   PROT 7.1 07/19/2019   ALBUMIN 2.9 (L) 07/19/2019   AST 27 07/19/2019   ALT 15 07/19/2019   ALKPHOS 192 (H) 07/19/2019   BILITOT 1.1 07/19/2019   GFRNONAA >60 07/26/2019   GFRAA >60 07/26/2019     Medications: I have reviewed the patient's current medications.   Assessment/Plan: 1. 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 Staging CTs  10/26/2017-asymmetric esophageal thickening, no evidence of metastatic disease ? Endoscopic biopsy 10/20/2017-mid esophagus partially obstructing mass, biopsy confirmed squamous cell carcinoma ? PET scan 11/02/2017-wall thickening in the mid esophagus SUV 27 MA: No findings specific for metastatic disease ? Radiation 11/09/2017-12/17/2017 ? Cycle 1 weekly Taxol/carboplatin 11/10/2017 ? Cycle 2 weekly Taxol/carboplatin 11/17/2017 (carboplatin held due to thrombocytopenia) ? Cycle 3 weekly Taxol/carboplatin 12/08/2017 (dosesreduced due to neutropenia, thrombocytopenia) ? Cycle 4 weekly Taxol/carboplatin 12/15/2017 ? Endoscopy 03/29/2018-no mass seen ? CT chest 12/29/2018-2 cm area associated with anterior wall the proximal esophagus, new-nonspecific, changes of cirrhosis ? PD-L1 CPS 5% ? Cycle 1 FOLFOX 02/07/2019 ? Cycle 2 FOLFOX 03/07/2019, Neulasta ? Cycle 3 FOLFOX 03/29/2019, Neulasta (oxaliplatin dose reduced to 40 mg/m due to thrombocytopenia following cycle 2) ? Cycle 4 FOLFOX 04/12/2019 ? Cycle 5 FOLFOX 04/26/2019 ? Cycle 6 FOLFOX 05/11/2019 ? CT chest 05/20/2019-enlargement of mass at the upper esophagus, changes of cirrhosis and emphysema ? Cycle 1 nivolumab 05/25/2019 ? Cycle 2 nivolumab 06/09/2019 ? Cycle 3 nivolumab 06/24/2019 ? Cycle 4 nivolumab 07/07/2019 ? CT chest 07/19/2019-progressive enlargement of necrotic mass at the upper esophagus with invasion of the posterior wall of the trachea 2. Solid dysphagia and odynophagia secondary to #1  Palliative radiation to the recurrent esophagus mass 07/25/2019-08/05/2019  3. coronary artery disease 4. History of prostate cancer 5. COPD 6. Permanent cardiac pacemaker 7. Peripheral vascular disease 8. Chronic thrombocytopenia 9. Neutropeniathrombocytopeniasecondary to chemotherapy.And probable underlying  cirrhosis 10. CT chest 12/28/2017-no evidence of pulmonary emboli. Esophageal thickening consistent with patient's history of esophageal  carcinoma. 11. Probable cirrhosis, referred to GI 12. Anemia-progressive compared to when he was here in June, potentially related to GI bleeding or epistaxis. Red cell transfusions 03/21/2019 and 04/28/2019 13. 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 Status post evaluation by [name], not a candidate for vocal fold injection at present. 14. Port-A-Cath placed 02/02/2011 15. Neutropenia secondary to chemotherapy 16. 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 Admission 07/19/2019 with hemoptysis secondary to the esophagus mass with invasion of the trachea, palliative radiation started 07/25/2019-improved 18. Gastrostomy tube placed 07/26/2019    Disposition: Mr.[name] will complete palliative radiation today.  The hemoptysis has improved.  Hopefully the dysphagia will improve over the next few weeks.  He will continue using the gastrostomy tube for fluids and feeding.  He will return for an office visit in 3 weeks.  [name] will continue follow-up with a home hospice RN.  He will complete a course of Diflucan for oral candidiasis.  Betsy Coder, MD  08/05/2019  11:08 AM

## 2019-08-08 ENCOUNTER — Ambulatory Visit

## 2019-08-08 ENCOUNTER — Telehealth: Payer: Self-pay

## 2019-08-08 ENCOUNTER — Telehealth: Payer: Self-pay | Admitting: Oncology

## 2019-08-08 NOTE — Progress Notes (Signed)
Nutrition  Daughter in law, Danton Clap called RD with questions regarding feeding tube.  Reports that patient feels hungry.  Also states that patient has reflux and feels that he can taste the formula.  Danton Clap says that patient feels weak and needs to build up his nutrition.  Patient is currently giving 1 carton of jevity 1.5 about 4 times per day (8am, 12, 4pm and 8pm).    Hospice is following patient at home.   Intervention Continue taking reflux medication Reduce volume of feeding 1 carton 5-6 times per day if able to tolerate.  Can also try 1/2 carton then 30 minutes later the other half of carton.  Discussed pump feeding as well as option but Alice did not think patient would want to do that.  Reduce water flush to 21ml before and after feeding if doing 5-6 times per day.   Alice unsure why received jevity and osmolite tube feeding. Says that [facility] is providing enteral nutrition supplies. Danton Clap has contact information.   Angelette Ganus B. Zenia Resides, [place], [place] Registered Dietitian [phone] (pager)

## 2019-08-08 NOTE — Telephone Encounter (Signed)
Scheduled per los. Called and spoke with family member. Confirmed appt

## 2019-08-09 ENCOUNTER — Inpatient Hospital Stay

## 2019-08-09 ENCOUNTER — Ambulatory Visit

## 2019-08-09 NOTE — Progress Notes (Signed)
Nutrition Follow-up:   Patient with esophageal cancer.  Patient has completed radiation treatments.  PEG placed on 5/11 (22 french).  Patient followed by hospice.  Called and spoke with [facility] for nutrition follow-up.  Alice reports that patient has been giving 71ml water flush, 1 carton of formula and 63ml water flush about 4 times per day.  Has not tried anything different.  Refllux is about the same.  Drank a cup of coffee this am without difficulty.  Reports having little bit of liquid stool this am, feels constipated.  No bowel movement 2 days prior.  Has constipation medication on hand but has not taken it.      Medications: reviewed  Labs: no new  Anthropometrics:   No new   Estimated Energy Needs  Kcals: [phone] Protein: 100-115 g Fluid: > 2 L  NUTRITION DIAGNOSIS: Inadequate oral intake continues   INTERVENTION:  Alice reports jevity 1.5 was shipped to patient after 1 case of osmolite.  Patent currently using 1 carton of jevity 1.5 QID.  Flushing with 49ml before and 9ml after.  Ideally, goal rate would be 6 cartons per day (1.5 QID).   Encouraged patient to drink water orally for better hydration at least additional 263ml.   Monitor stool and relux symptoms.  Alice has contact information    MONITORING, EVALUATION, GOAL: weight trends, TF tolerance, intake   NEXT VISIT: June 15 phone f/u  Zeya Balles B. Zenia Resides, [place], [place] Registered Dietitian [phone] (pager)

## 2019-08-10 ENCOUNTER — Ambulatory Visit

## 2019-08-10 ENCOUNTER — Telehealth: Payer: Self-pay | Admitting: *Deleted

## 2019-08-10 NOTE — Telephone Encounter (Addendum)
Left message requesting his nurse, Anderson Malta call back to discuss patient. RN called back and reports he was changed to Jevity 1.2 at patient request. He felt the Osmolite caused his reflux. Currently on 4-5 cans per day w/free water before and after feeding of 64ml. Tried Reglan, but patient felt it made the reflux worse. Changed his Nexium to omeprazole 20 mg bid. They are able to make dietician referral with Hospice but welcome our dietician assistance since family is comfortable with them. Informed her that our dietician wanted them aware that if possible, his goal intake if 6 cans/day w/continued water.

## 2019-08-11 ENCOUNTER — Ambulatory Visit

## 2019-08-11 ENCOUNTER — Ambulatory Visit: Payer: Medicare Other | Admitting: Radiation Oncology

## 2019-08-12 ENCOUNTER — Ambulatory Visit

## 2019-08-16 ENCOUNTER — Ambulatory Visit: Payer: Medicare Other

## 2019-08-17 ENCOUNTER — Ambulatory Visit: Payer: Medicare Other

## 2019-08-18 ENCOUNTER — Telehealth: Payer: Self-pay | Admitting: *Deleted

## 2019-08-18 ENCOUNTER — Ambulatory Visit: Payer: Medicare Other

## 2019-08-18 ENCOUNTER — Telehealth: Payer: Self-pay

## 2019-08-18 NOTE — Telephone Encounter (Signed)
Nutrition  Received message from Manuela Schwartz, [place] regarding hospice had communicated that patient was not tolerating feeding.    Called patient to discuss but patient wanted RD to call daughter in law [place] as patient hard to understand over the phone.  Called to speak with Alice.  Alice reports that patient is giving only about 4 cartons of jevity 1.2 (per progress note on) per day.  Is having a hard time with reflux.  Omperazole has been increased.  Patient has tried reglan in the past but felt it made symptoms worse.  Patient was originally on osmolite 1.5 before being changed to jevity 1.2 due to patient felt osmolite caused reflux.  Clarified with Alice that patient is giving tube feeding via gravity through syringe not using plunger.  Giving feeding at room temperature.  Patient is spacing feedings out about every 4 hours.    Intervention: Recommend changing from bolus feeding to continuous pump feeding.  Danton Clap said that hospice had talked about that today.  She is going to talk with patient again tonight and see what he wants to do.  I encouraged patient to reach out to Dr. Gearldine Shown RN with decision so can switch patient to pump.   Recommend ideally osmolite 1.5 (lower fiber formula). If patient wants to keep jevity would increase to 1.5 formula.  Would recommend osmolite 1.5 or jevity 1.5 at goal rate of 98ml/hr for 20 hours (6 cartons). Recommend starting at 45 ml/hr and increase 10 ml q day until goal rate of 53ml/hr reached.  Water flush of 180 ml at the start of feeding and when stops feeding.  Needs 180 ml additional water flush TID during waking hours for adequate hydration.   Provides 2133 calories, 89 g protein and 1980 ml (formula and flush).   Next visit June 8th if not sooner.    Eagan Shifflett B. Zenia Resides, [place], [place] Registered Dietitian [phone] (pager)

## 2019-08-18 NOTE — Telephone Encounter (Signed)
Reports still not tolerating the tube feedings with belching and reflux of formula. Sent message to dietician requesting a call to patient/family sooner than current appointment.

## 2019-08-19 ENCOUNTER — Ambulatory Visit: Payer: Medicare Other

## 2019-08-19 ENCOUNTER — Telehealth: Payer: Self-pay | Admitting: *Deleted

## 2019-08-19 NOTE — Telephone Encounter (Signed)
Called to report that [name] has currently declined the pump for continuous feedings at home. He thinks his throat is getting better.

## 2019-08-22 ENCOUNTER — Ambulatory Visit: Payer: Medicare Other

## 2019-08-22 ENCOUNTER — Telehealth: Payer: Self-pay

## 2019-08-23 ENCOUNTER — Inpatient Hospital Stay: Attending: Oncology

## 2019-08-23 ENCOUNTER — Telehealth: Payer: Self-pay

## 2019-08-23 ENCOUNTER — Ambulatory Visit: Payer: Medicare Other

## 2019-08-23 DIAGNOSIS — Z8546 Personal history of malignant neoplasm of prostate: Secondary | ICD-10-CM | POA: Insufficient documentation

## 2019-08-23 DIAGNOSIS — C154 Malignant neoplasm of middle third of esophagus: Secondary | ICD-10-CM | POA: Insufficient documentation

## 2019-08-23 DIAGNOSIS — D6959 Other secondary thrombocytopenia: Secondary | ICD-10-CM | POA: Insufficient documentation

## 2019-08-23 DIAGNOSIS — J449 Chronic obstructive pulmonary disease, unspecified: Secondary | ICD-10-CM | POA: Insufficient documentation

## 2019-08-23 DIAGNOSIS — I2510 Atherosclerotic heart disease of native coronary artery without angina pectoris: Secondary | ICD-10-CM | POA: Insufficient documentation

## 2019-08-23 DIAGNOSIS — Z950 Presence of cardiac pacemaker: Secondary | ICD-10-CM | POA: Insufficient documentation

## 2019-08-23 DIAGNOSIS — I739 Peripheral vascular disease, unspecified: Secondary | ICD-10-CM | POA: Insufficient documentation

## 2019-08-23 DIAGNOSIS — K7460 Unspecified cirrhosis of liver: Secondary | ICD-10-CM | POA: Insufficient documentation

## 2019-08-23 DIAGNOSIS — Z931 Gastrostomy status: Secondary | ICD-10-CM | POA: Insufficient documentation

## 2019-08-23 NOTE — Telephone Encounter (Signed)
Nutrition  Called daughter in law, Alice back this pm to follow-up and see how Mr Blasius felt after giving bolus tube feeding over 15 minutes.  Alice reports that he gave the feeding over 17 minutes and felt better, less burping, reflux.  Typically was taking about 5 minutes to give feeding.  Danton Clap confirms that patient stays sitting up for at least 1 hour after feeding.    Alice reports delivery of osmolite came in today.  Not sure if 1.2 or 1.5.    Alice reports gives about 120 ml water with medications QID about 30 minutes prior to feeding.    Holding off on continuous pump at this time and giving slower bolus feeding.   Will plan to follow-up with patient and daughter in law tomorrow.    Lumina Gitto B. Zenia Resides, [place], [place] Registered Dietitian [phone] (pager)

## 2019-08-23 NOTE — Progress Notes (Addendum)
Nutrition Follow-up:  Patient with esophageal cancer.  Patient has completed radiation treatments.  PEG placed on 5/11 (22 Fr).  Patient followed by hospice.    RD called and spoke with Danton Clap, daughter in law.  Danton Clap was with patient during telephone visit.  Alice reports that patient is giving about 3-4 cartons of osmolite 1.5 recently as has run out of jevity 1.2.  Hospice is aware.  Patient reports that when he gives a carton of food via feeding tube a few minutes later he has reflux and a sour, awful taste in his mouth and burps. Reports was given an enema either this past Thursday or Friday and has had 2 bowel movements since then.    Alice reports that they were told not to give patient anything by mouth due to aspiration risk.    Noted patient did not want to try continuous pump feeding.   Medications: Alice read medications dexamethasone, ondansetro, famotidine, pantroprazole, metoclopramide.    Labs: no new  Anthropometrics:   No new   Estimated Energy Needs  Kcals: 2000-2235 Protein: 100-115 g Fluid: 2 L  NUTRITION DIAGNOSIS: Inadequate oral intake continues   INTERVENTION:  Reviewed proper way to administer bolus feeding.  Encouraged patient to take 10-15 minutes to infuse 1 carton of formula via gravity.   Discussed gravity bag option with patient and Alice.  Declined this option. Discussed more about continuous pump tube feeding option.    Alice has contact information    MONITORING, EVALUATION, GOAL: TF tolerance, weight trends   NEXT VISIT: Wed, June 9, phone f/u  Ameri Cahoon B. Zenia Resides, [place], [place] Registered Dietitian [phone] (pager)

## 2019-08-24 ENCOUNTER — Telehealth: Payer: Self-pay

## 2019-08-24 ENCOUNTER — Ambulatory Visit: Payer: Medicare Other

## 2019-08-24 NOTE — Telephone Encounter (Signed)
Nutrition  Received call from hospice RN, Anderson Malta for patient.  She is with Mr Lacher at the time of call.  Patient still complaining about burping after feeding/reflux.  Patient is agreeable to trying continuous pump feeding. Mr Kitner wants to use the jevity formula.  Does not like the osmolite per [place] feels that this makes reflux worse. Celeste reports KUB has been done but unsure of results. Patient reports he may have had blood in stool.    Recommend jevity 1.5 at goal rate of 98ml/hr for 20 hours (6 cartons daily).  Recommend 180 ml water flush at the start of feeding and at the end of feeding. Will need additional 193ml water flush TID to better meet hydration needs.  Recommend starting pump at 35ml/hr and increasing by 19ml daily until goal rate of 12ml/hr reached.  If patient tolerates feeding can increase rate and shorten time on pump.    Provides 2133 calories, 89 g protein and 1950ml free water (formula and flush)  Celeste wanting RN, Merceda Elks to call her to discuss patient.  Message sent to [place].    Faxed recommendations to [facility].    Ruthe Roemer B. Zenia Resides, [place], [place] Registered Dietitian [phone] (pager)

## 2019-08-24 NOTE — Telephone Encounter (Signed)
Nutrition  Called Alice, daughter in law to patient for nutrition follow-up.  No answer. Left message with call back number.   Torrance Frech B. Zenia Resides, [place], [place] Registered Dietitian [phone] (pager)

## 2019-08-25 ENCOUNTER — Telehealth: Payer: Self-pay

## 2019-08-25 ENCOUNTER — Ambulatory Visit: Payer: Medicare Other

## 2019-08-25 NOTE — Telephone Encounter (Signed)
VM left for pt's hospice RN informing her that MD Sherrill's office will collect labwork for this pt at his next appt with [place] at [facility] on 08/26/19.

## 2019-08-26 ENCOUNTER — Ambulatory Visit: Payer: Medicare Other

## 2019-08-26 ENCOUNTER — Other Ambulatory Visit: Payer: Self-pay

## 2019-08-26 ENCOUNTER — Encounter: Payer: Self-pay | Admitting: Nurse Practitioner

## 2019-08-26 ENCOUNTER — Inpatient Hospital Stay (HOSPITAL_BASED_OUTPATIENT_CLINIC_OR_DEPARTMENT_OTHER): Admitting: Nurse Practitioner

## 2019-08-26 ENCOUNTER — Inpatient Hospital Stay

## 2019-08-26 VITALS — BP 104/63 | HR 106 | Temp 98.1°F | Resp 20 | Ht 67.0 in | Wt 148.2 lb

## 2019-08-26 DIAGNOSIS — Z95828 Presence of other vascular implants and grafts: Secondary | ICD-10-CM

## 2019-08-26 DIAGNOSIS — K7460 Unspecified cirrhosis of liver: Secondary | ICD-10-CM | POA: Diagnosis not present

## 2019-08-26 DIAGNOSIS — I2510 Atherosclerotic heart disease of native coronary artery without angina pectoris: Secondary | ICD-10-CM | POA: Diagnosis not present

## 2019-08-26 DIAGNOSIS — Z950 Presence of cardiac pacemaker: Secondary | ICD-10-CM | POA: Diagnosis not present

## 2019-08-26 DIAGNOSIS — C159 Malignant neoplasm of esophagus, unspecified: Secondary | ICD-10-CM | POA: Diagnosis not present

## 2019-08-26 DIAGNOSIS — J449 Chronic obstructive pulmonary disease, unspecified: Secondary | ICD-10-CM | POA: Diagnosis not present

## 2019-08-26 DIAGNOSIS — D6959 Other secondary thrombocytopenia: Secondary | ICD-10-CM | POA: Diagnosis not present

## 2019-08-26 DIAGNOSIS — C154 Malignant neoplasm of middle third of esophagus: Secondary | ICD-10-CM | POA: Diagnosis present

## 2019-08-26 DIAGNOSIS — Z8546 Personal history of malignant neoplasm of prostate: Secondary | ICD-10-CM | POA: Diagnosis not present

## 2019-08-26 DIAGNOSIS — Z931 Gastrostomy status: Secondary | ICD-10-CM | POA: Diagnosis not present

## 2019-08-26 DIAGNOSIS — I739 Peripheral vascular disease, unspecified: Secondary | ICD-10-CM | POA: Diagnosis not present

## 2019-08-26 LAB — CMP (CANCER CENTER ONLY)
ALT: 32 U/L (ref 0–44)
AST: 28 U/L (ref 15–41)
Albumin: 2.1 g/dL — ABNORMAL LOW (ref 3.5–5.0)
Alkaline Phosphatase: 273 U/L — ABNORMAL HIGH (ref 38–126)
Anion gap: 7 (ref 5–15)
BUN: 23 mg/dL (ref 8–23)
CO2: 27 mmol/L (ref 22–32)
Calcium: 8.1 mg/dL — ABNORMAL LOW (ref 8.9–10.3)
Chloride: 98 mmol/L (ref 98–111)
Creatinine: 0.96 mg/dL (ref 0.61–1.24)
GFR, Est AFR Am: >60 mL/min (ref >60)
GFR, Estimated: >60 mL/min (ref >60)
Glucose, Bld: 57 mg/dL — ABNORMAL LOW (ref 70–99)
Potassium: 4.4 mmol/L (ref 3.5–5.1)
Sodium: 132 mmol/L — ABNORMAL LOW (ref 135–145)
Total Bilirubin: 2 mg/dL — ABNORMAL HIGH (ref 0.3–1.2)
Total Protein: 5.8 g/dL — ABNORMAL LOW (ref 6.5–8.1)

## 2019-08-26 LAB — CBC WITH DIFFERENTIAL (CANCER CENTER ONLY)
Abs Immature Granulocytes: 0.03 10*3/uL (ref 0.00–0.07)
Basophils Absolute: 0 10*3/uL (ref 0.0–0.1)
Basophils Relative: 0 %
Eosinophils Absolute: 0 10*3/uL (ref 0.0–0.5)
Eosinophils Relative: 0 %
HCT: 33.3 % — ABNORMAL LOW (ref 39.0–52.0)
Hemoglobin: 11 g/dL — ABNORMAL LOW (ref 13.0–17.0)
Immature Granulocytes: 1 %
Lymphocytes Relative: 12 %
Lymphs Abs: 0.4 10*3/uL — ABNORMAL LOW (ref 0.7–4.0)
MCH: 30.1 pg (ref 26.0–34.0)
MCHC: 33 g/dL (ref 30.0–36.0)
MCV: 91 fL (ref 80.0–100.0)
Monocytes Absolute: 0.2 10*3/uL (ref 0.1–1.0)
Monocytes Relative: 6 %
Neutro Abs: 3.1 10*3/uL (ref 1.7–7.7)
Neutrophils Relative %: 81 %
Platelet Count: 34 10*3/uL — ABNORMAL LOW (ref 150–400)
RBC: 3.66 MIL/uL — ABNORMAL LOW (ref 4.22–5.81)
RDW: 18.6 % — ABNORMAL HIGH (ref 11.5–15.5)
WBC Count: 3.8 10*3/uL — ABNORMAL LOW (ref 4.0–10.5)
nRBC: 0 % (ref 0.0–0.2)

## 2019-08-26 LAB — SAMPLE TO BLOOD BANK

## 2019-08-26 MED ORDER — HEPARIN SOD (PORK) LOCK FLUSH 100 UNIT/ML IV SOLN
500.0000 [IU] | Freq: Once | INTRAVENOUS | Status: AC | PRN
  Administered 2019-08-26: 500 [IU]
  Filled 2019-08-26: qty 5

## 2019-08-26 MED ORDER — HYDROCODONE-ACETAMINOPHEN 7.5-325 MG/15ML PO SOLN: 15.0000 mL | Freq: Four times a day (QID) | ORAL | 0 refills | Status: AC | PRN

## 2019-08-26 MED ORDER — SODIUM CHLORIDE 0.9% FLUSH
10.0000 mL | INTRAVENOUS | Status: DC | PRN
  Administered 2019-08-26: 10 mL
  Filled 2019-08-26: qty 10

## 2019-08-26 NOTE — Progress Notes (Addendum)
[facility] OFFICE PROGRESS NOTE   Diagnosis: Esophagus cancer  INTERVAL HISTORY:   [name] returns as scheduled.  He is enrolled in the hospice program.  He continues tube feedings.  He will be starting a new tube feed/pump tonight.  He denies any bleeding.  He has pain at the feeding tube site.  He takes hydrocodone as needed.  Bowels are moving.   Objective:  Vital signs in last 24 hours:  Blood pressure 104/63, pulse (!) 106, temperature 98.1 F (36.7 C), temperature source Temporal, resp. rate 20, height '5\' 7"'  (1.702 m), weight 148 lb 3.2 oz (67.2 kg), SpO2 (!) 89 %.    HEENT: White coating over tongue. Resp: Rales at both lung bases.  No respiratory distress. Cardio: Regular rate and rhythm. GI: Abdomen soft and nontender.  No hepatomegaly.  Left abdomen feeding tube site with mild surrounding erythema. Vascular: No leg edema. Port-A-Cath without erythema.  Lab Results:  Lab Results  Component Value Date   WBC 3.8 (L) 08/26/2019   HGB 11.0 (L) 08/26/2019   HCT 33.3 (L) 08/26/2019   MCV 91.0 08/26/2019   PLT 34 (L) 08/26/2019   NEUTROABS 3.1 08/26/2019    Imaging:  No results found.  Medications: I have reviewed the patient's current medications.  Assessment/Plan: 1. 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 Staging CTs 10/26/2017-asymmetric esophageal thickening, no evidence of metastatic disease ? Endoscopic biopsy 10/20/2017-mid esophagus partially obstructing mass, biopsy confirmed squamous cell carcinoma ? PET scan 11/02/2017-wall thickening in the mid esophagus SUV 27 MA: No findings specific for metastatic disease ? Radiation 11/09/2017-12/17/2017 ? Cycle 1 weekly Taxol/carboplatin 11/10/2017 ? Cycle 2 weekly Taxol/carboplatin 11/17/2017 (carboplatin held due to thrombocytopenia) ? Cycle 3 weekly Taxol/carboplatin 12/08/2017 (dosesreduced due to neutropenia, thrombocytopenia) ? Cycle 4 weekly Taxol/carboplatin  12/15/2017 ? Endoscopy 03/29/2018-no mass seen ? CT chest 12/29/2018-2 cm area associated with anterior wall the proximal esophagus, new-nonspecific, changes of cirrhosis ? PD-L1 CPS 5% ? Cycle 1 FOLFOX 02/07/2019 ? Cycle 2 FOLFOX 03/07/2019, Neulasta ? Cycle 3 FOLFOX 03/29/2019, Neulasta (oxaliplatin dose reduced to 40 mg/m due to thrombocytopenia following cycle 2) ? Cycle 4 FOLFOX 04/12/2019 ? Cycle 5 FOLFOX 04/26/2019 ? Cycle 6 FOLFOX 05/11/2019 ? CT chest 05/20/2019-enlargement of mass at the upper esophagus, changes of cirrhosis and emphysema ? Cycle 1 nivolumab 05/25/2019 ? Cycle 2 nivolumab 06/09/2019 ? Cycle 3 nivolumab 06/24/2019 ? Cycle 4 nivolumab 07/07/2019 ? CT chest 07/19/2019-progressive enlargement of necrotic mass at the upper esophagus with invasion of the posterior wall of the trachea 2. Solid dysphagia and odynophagia secondary to #1  Palliative radiation to the recurrent esophagus mass 07/25/2019-08/05/2019  3. coronary artery disease 4. History of prostate cancer 5. COPD 6. Permanent cardiac pacemaker 7. Peripheral vascular disease 8. Chronic thrombocytopenia 9. 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 CT chest 12/28/2017-no evidence of pulmonary emboli. Esophageal thickening consistent with patient's history of esophageal carcinoma. 11. Probable cirrhosis, referred to GI 12. Anemia-progressive compared to when he was here in June, potentially related to GI bleeding or epistaxis. Red cell transfusions 03/21/2019 and 04/28/2019 13. 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 Status post evaluation by [name], not a candidate for vocal fold injection at present. 14. Port-A-Cath placed 02/02/2011 15. Neutropenia secondary to chemotherapy 16. 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 Admission 07/19/2019 with hemoptysis secondary to the esophagus mass with  invasion of the trachea, palliative radiation  started 07/25/2019-improved 18. Gastrostomy tube placed 07/26/2019   Disposition: [name] continues follow-up with home hospice.  The dietitian is working with him to improve tolerance of the tube feedings.  Plan to continue supportive/comfort care.  We refilled hydrocodone today.  We reviewed the CBC from today.  Hemoglobin is stable.  He has progressive thrombocytopenia likely due to cirrhosis.  He will contact the office with bleeding.  He will return for lab and follow-up in 3 to 4 weeks.  Patient seen with Dr. Benay Spice.    Ned Card ANP/GNP-BC   08/26/2019  2:12 PM  This was a shared visit with Ned Card.  [name] appears to be slowly declining.  He continues follow-up with the home hospice program.  The low platelet count is secondary to cirrhosis and recent radiation.  Julieanne Manson, MD

## 2019-08-29 ENCOUNTER — Telehealth: Payer: Self-pay | Admitting: *Deleted

## 2019-08-29 ENCOUNTER — Ambulatory Visit: Payer: Medicare Other

## 2019-08-29 ENCOUNTER — Telehealth: Payer: Self-pay

## 2019-08-29 ENCOUNTER — Telehealth: Payer: Self-pay | Admitting: Oncology

## 2019-08-29 NOTE — Telephone Encounter (Signed)
Scheduled per los. Called and left msg. Mailed printout  °

## 2019-08-29 NOTE — Telephone Encounter (Signed)
Left VM that a piece of his G-tube is missing. Asking for information to be able to order the missing part. Called back and left VM with phone number of [facility] interventional radiology who placed the G-tube to call for the information requested.

## 2019-08-30 ENCOUNTER — Inpatient Hospital Stay

## 2019-08-30 ENCOUNTER — Ambulatory Visit: Payer: Medicare Other

## 2019-08-30 NOTE — Progress Notes (Signed)
Nutrition  RD called [facility] RN for nutrition follow-up.  Left message on voicemail with call back number.  Branden Shallenberger B. Zenia Resides, [place], [place] Registered Dietitian [phone] (pager)

## 2019-08-31 ENCOUNTER — Emergency Department (HOSPITAL_COMMUNITY)

## 2019-08-31 ENCOUNTER — Encounter (HOSPITAL_COMMUNITY): Payer: Self-pay

## 2019-08-31 ENCOUNTER — Ambulatory Visit: Payer: Medicare Other

## 2019-08-31 ENCOUNTER — Inpatient Hospital Stay (HOSPITAL_COMMUNITY)

## 2019-08-31 ENCOUNTER — Emergency Department (HOSPITAL_COMMUNITY)
Admission: EM | Admit: 2019-08-31 | Discharge: 2019-08-31 | Disposition: A | Discharge status: Home / Self Care | Source: Home / Self Care | Attending: Emergency Medicine | Admitting: Emergency Medicine

## 2019-08-31 ENCOUNTER — Other Ambulatory Visit: Payer: Self-pay

## 2019-08-31 ENCOUNTER — Inpatient Hospital Stay (HOSPITAL_COMMUNITY)
Admission: RE | Admit: 2019-08-31 | Discharge: 2019-09-02 | DRG: 378 | Disposition: A | Discharge status: Hospice | Attending: Internal Medicine | Admitting: Internal Medicine

## 2019-08-31 DIAGNOSIS — Z952 Presence of prosthetic heart valve: Secondary | ICD-10-CM

## 2019-08-31 DIAGNOSIS — N179 Acute kidney failure, unspecified: Secondary | ICD-10-CM

## 2019-08-31 DIAGNOSIS — I739 Peripheral vascular disease, unspecified: Secondary | ICD-10-CM | POA: Diagnosis present

## 2019-08-31 DIAGNOSIS — Z79899 Other long term (current) drug therapy: Secondary | ICD-10-CM | POA: Insufficient documentation

## 2019-08-31 DIAGNOSIS — C159 Malignant neoplasm of esophagus, unspecified: Secondary | ICD-10-CM | POA: Insufficient documentation

## 2019-08-31 DIAGNOSIS — Z9079 Acquired absence of other genital organ(s): Secondary | ICD-10-CM

## 2019-08-31 DIAGNOSIS — M1990 Unspecified osteoarthritis, unspecified site: Secondary | ICD-10-CM | POA: Diagnosis present

## 2019-08-31 DIAGNOSIS — K921 Melena: Principal | ICD-10-CM | POA: Diagnosis present

## 2019-08-31 DIAGNOSIS — I25110 Atherosclerotic heart disease of native coronary artery with unstable angina pectoris: Secondary | ICD-10-CM | POA: Diagnosis present

## 2019-08-31 DIAGNOSIS — D62 Acute posthemorrhagic anemia: Secondary | ICD-10-CM

## 2019-08-31 DIAGNOSIS — D61818 Other pancytopenia: Secondary | ICD-10-CM | POA: Diagnosis present

## 2019-08-31 DIAGNOSIS — I2510 Atherosclerotic heart disease of native coronary artery without angina pectoris: Secondary | ICD-10-CM | POA: Insufficient documentation

## 2019-08-31 DIAGNOSIS — R4182 Altered mental status, unspecified: Secondary | ICD-10-CM

## 2019-08-31 DIAGNOSIS — G4733 Obstructive sleep apnea (adult) (pediatric): Secondary | ICD-10-CM | POA: Diagnosis present

## 2019-08-31 DIAGNOSIS — Z8546 Personal history of malignant neoplasm of prostate: Secondary | ICD-10-CM

## 2019-08-31 DIAGNOSIS — C154 Malignant neoplasm of middle third of esophagus: Secondary | ICD-10-CM | POA: Diagnosis present

## 2019-08-31 DIAGNOSIS — I495 Sick sinus syndrome: Secondary | ICD-10-CM | POA: Diagnosis present

## 2019-08-31 DIAGNOSIS — R627 Adult failure to thrive: Secondary | ICD-10-CM | POA: Diagnosis present

## 2019-08-31 DIAGNOSIS — K7460 Unspecified cirrhosis of liver: Secondary | ICD-10-CM | POA: Diagnosis present

## 2019-08-31 DIAGNOSIS — I480 Paroxysmal atrial fibrillation: Secondary | ICD-10-CM | POA: Diagnosis present

## 2019-08-31 DIAGNOSIS — I252 Old myocardial infarction: Secondary | ICD-10-CM | POA: Diagnosis not present

## 2019-08-31 DIAGNOSIS — Z66 Do not resuscitate: Secondary | ICD-10-CM | POA: Diagnosis present

## 2019-08-31 DIAGNOSIS — I5022 Chronic systolic (congestive) heart failure: Secondary | ICD-10-CM | POA: Diagnosis present

## 2019-08-31 DIAGNOSIS — Z951 Presence of aortocoronary bypass graft: Secondary | ICD-10-CM | POA: Diagnosis not present

## 2019-08-31 DIAGNOSIS — I959 Hypotension, unspecified: Secondary | ICD-10-CM | POA: Diagnosis present

## 2019-08-31 DIAGNOSIS — R1310 Dysphagia, unspecified: Secondary | ICD-10-CM | POA: Diagnosis present

## 2019-08-31 DIAGNOSIS — I110 Hypertensive heart disease with heart failure: Secondary | ICD-10-CM | POA: Diagnosis present

## 2019-08-31 DIAGNOSIS — Z20822 Contact with and (suspected) exposure to covid-19: Secondary | ICD-10-CM | POA: Diagnosis present

## 2019-08-31 DIAGNOSIS — I10 Essential (primary) hypertension: Secondary | ICD-10-CM | POA: Insufficient documentation

## 2019-08-31 DIAGNOSIS — J449 Chronic obstructive pulmonary disease, unspecified: Secondary | ICD-10-CM | POA: Diagnosis present

## 2019-08-31 DIAGNOSIS — T85528A Displacement of other gastrointestinal prosthetic devices, implants and grafts, initial encounter: Secondary | ICD-10-CM

## 2019-08-31 DIAGNOSIS — J9601 Acute respiratory failure with hypoxia: Secondary | ICD-10-CM | POA: Diagnosis present

## 2019-08-31 DIAGNOSIS — K922 Gastrointestinal hemorrhage, unspecified: Secondary | ICD-10-CM | POA: Diagnosis present

## 2019-08-31 DIAGNOSIS — Z931 Gastrostomy status: Secondary | ICD-10-CM

## 2019-08-31 DIAGNOSIS — D6959 Other secondary thrombocytopenia: Secondary | ICD-10-CM | POA: Diagnosis present

## 2019-08-31 DIAGNOSIS — Z950 Presence of cardiac pacemaker: Secondary | ICD-10-CM

## 2019-08-31 DIAGNOSIS — Z800 Family history of malignant neoplasm of digestive organs: Secondary | ICD-10-CM

## 2019-08-31 DIAGNOSIS — K9423 Gastrostomy malfunction: Secondary | ICD-10-CM | POA: Insufficient documentation

## 2019-08-31 DIAGNOSIS — R0989 Other specified symptoms and signs involving the circulatory and respiratory systems: Secondary | ICD-10-CM

## 2019-08-31 DIAGNOSIS — Z95820 Peripheral vascular angioplasty status with implants and grafts: Secondary | ICD-10-CM | POA: Insufficient documentation

## 2019-08-31 DIAGNOSIS — E785 Hyperlipidemia, unspecified: Secondary | ICD-10-CM | POA: Insufficient documentation

## 2019-08-31 DIAGNOSIS — Z87891 Personal history of nicotine dependence: Secondary | ICD-10-CM

## 2019-08-31 DIAGNOSIS — Z596 Low income: Secondary | ICD-10-CM

## 2019-08-31 DIAGNOSIS — Z431 Encounter for attention to gastrostomy: Secondary | ICD-10-CM

## 2019-08-31 DIAGNOSIS — E7800 Pure hypercholesterolemia, unspecified: Secondary | ICD-10-CM | POA: Insufficient documentation

## 2019-08-31 LAB — CBC WITH DIFFERENTIAL/PLATELET
Abs Immature Granulocytes: 0.04 10*3/uL (ref 0.00–0.07)
Basophils Absolute: 0 10*3/uL (ref 0.0–0.1)
Basophils Relative: 0 %
Eosinophils Absolute: 0 10*3/uL (ref 0.0–0.5)
Eosinophils Relative: 0 %
HCT: 26.5 % — ABNORMAL LOW (ref 39.0–52.0)
Hemoglobin: 8.3 g/dL — ABNORMAL LOW (ref 13.0–17.0)
Immature Granulocytes: 1 %
Lymphocytes Relative: 11 %
Lymphs Abs: 0.4 10*3/uL — ABNORMAL LOW (ref 0.7–4.0)
MCH: 30.9 pg (ref 26.0–34.0)
MCHC: 31.3 g/dL (ref 30.0–36.0)
MCV: 98.5 fL (ref 80.0–100.0)
Monocytes Absolute: 0.3 10*3/uL (ref 0.1–1.0)
Monocytes Relative: 8 %
Neutro Abs: 3 10*3/uL (ref 1.7–7.7)
Neutrophils Relative %: 80 %
Platelets: 31 10*3/uL — ABNORMAL LOW (ref 150–400)
RBC: 2.69 MIL/uL — ABNORMAL LOW (ref 4.22–5.81)
RDW: 20.6 % — ABNORMAL HIGH (ref 11.5–15.5)
WBC: 3.7 10*3/uL — ABNORMAL LOW (ref 4.0–10.5)
nRBC: 0.5 % — ABNORMAL HIGH (ref 0.0–0.2)

## 2019-08-31 LAB — COMPREHENSIVE METABOLIC PANEL
ALT: 24 U/L (ref 0–44)
AST: 29 U/L (ref 15–41)
Albumin: 1.8 g/dL — ABNORMAL LOW (ref 3.5–5.0)
Alkaline Phosphatase: 177 U/L — ABNORMAL HIGH (ref 38–126)
Anion gap: 12 (ref 5–15)
BUN: 54 mg/dL — ABNORMAL HIGH (ref 8–23)
CO2: 22 mmol/L (ref 22–32)
Calcium: 7.5 mg/dL — ABNORMAL LOW (ref 8.9–10.3)
Chloride: 107 mmol/L (ref 98–111)
Creatinine, Ser: 1.49 mg/dL — ABNORMAL HIGH (ref 0.61–1.24)
GFR calc Af Amer: 50 mL/min — ABNORMAL LOW (ref >60)
GFR calc non Af Amer: 43 mL/min — ABNORMAL LOW (ref >60)
Glucose, Bld: 174 mg/dL — ABNORMAL HIGH (ref 70–99)
Potassium: 4.1 mmol/L (ref 3.5–5.1)
Sodium: 141 mmol/L (ref 135–145)
Total Bilirubin: 2 mg/dL — ABNORMAL HIGH (ref 0.3–1.2)
Total Protein: 4.9 g/dL — ABNORMAL LOW (ref 6.5–8.1)

## 2019-08-31 LAB — SARS CORONAVIRUS 2 BY RT PCR (HOSPITAL ORDER, PERFORMED IN ~~LOC~~ HOSPITAL LAB): SARS Coronavirus 2: NEGATIVE

## 2019-08-31 LAB — POC OCCULT BLOOD, ED: Fecal Occult Bld: POSITIVE — AB

## 2019-08-31 MED ORDER — SODIUM CHLORIDE 0.9 % IV SOLN: INTRAVENOUS | Status: AC

## 2019-08-31 MED ORDER — IOHEXOL 300 MG/ML  SOLN
30.0000 mL | Freq: Once | INTRAMUSCULAR | Status: AC | PRN
  Administered 2019-08-31: 30 mL

## 2019-08-31 MED ORDER — SODIUM CHLORIDE 0.9 % IV BOLUS
1000.0000 mL | Freq: Once | INTRAVENOUS | Status: AC
  Administered 2019-08-31: 1000 mL via INTRAVENOUS

## 2019-08-31 MED ORDER — SODIUM CHLORIDE 0.9 % IV SOLN
10.0000 mL/h | Freq: Once | INTRAVENOUS | Status: AC
  Administered 2019-09-01: 10 mL/h via INTRAVENOUS

## 2019-08-31 MED ORDER — DIATRIZOATE MEGLUMINE & SODIUM 66-10 % PO SOLN
50.0000 mL | Freq: Once | ORAL | Status: DC
  Filled 2019-08-31: qty 60

## 2019-08-31 NOTE — ED Triage Notes (Signed)
Pt is a hospice patient from home with lung cancer Daughters report they took his to the bathroom this evening and noticed a large amount of rectal bleeding EMS reports that he was very altered when they arrived Pt is nonverbal at baseline per daughters

## 2019-08-31 NOTE — ED Provider Notes (Signed)
Emergency Department Provider Note   I have reviewed the triage vital signs and the nursing notes.   HISTORY  Chief Complaint Feeding Tube Out   HPI [name]. is a 85 y.o. male with past medical history reviewed below presents to the emergency department with dislodged feeding tube.  It was last exchanged in May of this year.  Review of that note shows it was a 7 [place] G-tube.  Patient is not having any complaints at this time.  He states it became dislodged this morning and he came into the emergency department immediately.  EMS report being called to scene and found him on room air at 89%.  Patient does wear home oxygen but was not wearing it at the time.  They placed him on his home oxygen and saturations returned to normal.  He is not complaining of shortness of breath, fever, abdominal pain, or other symptoms.    Past Medical History:  Diagnosis Date  . AAA (abdominal aortic aneurysm) ([place])    a. Last duplex - 3.8cm 01/2013 - due 01/2014.  [name] Acne rosacea   . Alcoholic hepatitis with ascites 08/28/2017  . Anginal pain ([place]) 1996  . Aortic stenosis, mild    Last echo 05/04/12 +LVH  . Arthritis    "shoulders" (09/01/2017)  . Back pain    hx epidural injections  . Baker's cyst    Lt.  [name] CAD (coronary artery disease)    a. s/p MI/PTCA - Cx 1994. b. s/p CABG x5 in 1997. c. Abnl nuc 2003- occ SVG-RCA, patent seq SVG-OM1-dCxOM, patentl LIMA-LAD, patent, SVG-diag. d. Normal nuc 09/2012.  . Carotid artery disease ([place])    a. Duplex 01/2013: mildly abnormal. Mild plaque without significant diameter reduction. Repeat recommended 11/15.  [name] COPD (chronic obstructive pulmonary disease) ([place])   . Dizziness and giddiness 08/04/2013  . GERD (gastroesophageal reflux disease)   . H/O: GI bleed    mild, neg. colonoscopy  . Heart murmur   . HOH (hard of hearing)   . HTN (hypertension)   . Hyperlipidemia   . Hypertensive heart disease   . Myocardial infarction ([place]) 1994  . OSA  on CPAP   . OSA treated with BiPAP 11/20/2015   Severe with [facility] 38/hr  . PAF (paroxysmal atrial fibrillation) ([place])    a. Remote per records.   . Pneumonia    history  . Presence of permanent cardiac pacemaker    DOI 1999 with change out 2006; St Jude  . Prostate cancer ([place]) ~ 2010   a. s/p radical retropubic prostatectomy with bilateral pelvic lymph node dissection  . PVD (peripheral vascular disease) ([place])    a. Rt ext iliac stenosis, last PV cath 2003, moderate stenosis last Lower Ext Dopplers 12/16/11 - Rt. ABI 0.96  Lt ABI 1.0.  . Sick sinus syndrome ([place])    a. placed 1999, gen change 2011 - St. Jude.  . Sinus node dysfunction ([place]) 01/20/2013  . Syncope 11/28/97  . Thrombocytopenia ([facility])    chronic, mild  . Valvular heart disease    a. Echo 04/2012: mild-mod AS, mild AI, mild MR.    Patient Active Problem List   Diagnosis Date Noted  . Malnutrition of moderate degree 07/27/2019  . DNR (do not resuscitate)   . Failure to thrive in adult   . Odynophagia   . Palliative care by specialist   . Cough with hemoptysis 07/19/2019  . Hemoptysis   . VT (ventricular tachycardia) ([place]) 05/22/2019  .  LBBB (left bundle branch block) 05/22/2019  . Port-A-Cath in place 02/21/2019  . Goals of care, counseling/discussion 01/20/2019  . Unstable angina ([place])   . Presbycusis of both ears 04/08/2018  . Squamous cell esophageal cancer ([place])   . Anticoagulated by anticoagulation treatment 03/19/2018  . Epistaxis 03/19/2018  . Pacemaker 02/19/2018  . Coronary artery disease involving coronary bypass graft of native heart without angina pectoris 02/19/2018  . Anemia 01/19/2018  . Hypercholesterolemia 12/28/2017  . Encounter for antineoplastic chemotherapy 12/08/2017  . Malignant tumor of middle third of esophagus ([place]) 10/28/2017  . Melena   . Heme positive stool   . Acute post-hemorrhagic anemia   . Esophageal mass   . Angina, class III ([place]) 10/01/2017  . Chest pain with moderate risk  for cardiac etiology 09/01/2017  . Chest pain, rule out acute myocardial infarction 08/26/2017  . Essential hypertension 05/14/2016  . OSA (obstructive sleep apnea) 11/20/2015  . Obesity (BMI 30-39.9) 11/20/2015  . S/P TAVR (transcatheter aortic valve replacement) 06/12/2015  . Severe aortic stenosis 06/05/2015  . AAA (abdominal aortic aneurysm) without rupture ([place]) 04/27/2015  . PAD (peripheral artery disease) ([place]) 04/27/2015  . Dizziness and giddiness 08/04/2013  . Dizziness 07/19/2013  . SSS (sick sinus syndrome) ([place]) 01/20/2013  . Hypertensive cardiovascular disease 01/20/2013  . Paroxysmal atrial fibrillation (HCC)   . CAD, multiple vessel   . Pacemaker -[employer]   . Aortic stenosis 05/07/2012    Class: Diagnosis of  . Syncope 11/28/1997    Past Surgical History:  Procedure Laterality Date  . APPENDECTOMY    . BIOPSY  10/20/2017   Procedure: BIOPSY;  Surgeon: Doran Stabler, MD;  Location: [facility];  Service: Gastroenterology;;  . CARDIAC CATHETERIZATION  2003   VG to RCA occluded, other grafts patent  . CARDIAC CATHETERIZATION N/A 05/04/2015   Procedure: Right/Left Heart Cath and Coronary/Graft Angiography;  Surgeon: Sherren Mocha, MD;  Location: [place] [facility];  Service: Cardiovascular;  Laterality: N/A;  . CARDIAC CATHETERIZATION  09/01/2017  . CARDIAC VALVE REPLACEMENT    . CATARACT EXTRACTION W/ INTRAOCULAR LENS  IMPLANT, BILATERAL Bilateral   . COLONOSCOPY    . CORONARY ANGIOPLASTY  03/29/92   PTCA to LCX  . CORONARY ARTERY BYPASS GRAFT  1997   LIMA-LAD; VG-diag; seq VG-1st OM & distal LCX; VG-RCA  . CORONARY ATHERECTOMY N/A 09/01/2017   Procedure: CORONARY ATHERECTOMY;  Surgeon: Jettie Booze, MD;  Location: [place] [facility];  Service: Cardiovascular;  Laterality: N/A;  . CORONARY ATHERECTOMY  10/01/2017  . CORONARY ATHERECTOMY N/A 10/01/2017   Procedure: CORONARY ATHERECTOMY;  Surgeon: Leonie Man, MD;  Location: [place] [facility];   Service: Cardiovascular;  Laterality: N/A;  . ESOPHAGOGASTRODUODENOSCOPY (EGD) WITH PROPOFOL N/A 10/20/2017   Procedure: ESOPHAGOGASTRODUODENOSCOPY (EGD) WITH PROPOFOL;  Surgeon: Doran Stabler, MD;  Location: [facility];  Service: Gastroenterology;  Laterality: N/A;  . ESOPHAGOGASTRODUODENOSCOPY (EGD) WITH PROPOFOL N/A 03/29/2018   Procedure: ESOPHAGOGASTRODUODENOSCOPY (EGD) WITH PROPOFOL;  Surgeon: Doran Stabler, MD;  Location: [facility];  Service: Gastroenterology;  Laterality: N/A;  . INSERT / REPLACE / REMOVE PACEMAKER  11/28/1997   pacesetter--ERI 2011  . IR GASTROSTOMY TUBE MOD SED  07/26/2019  . IR IMAGING GUIDED PORT INSERTION  02/02/2019  . LEFT HEART CATH N/A 09/01/2017   Procedure: Left Heart Cath;  Surgeon: Jettie Booze, MD;  Location: [place] [facility];  Service: Cardiovascular;  Laterality: N/A;  . LEFT HEART CATH AND CORS/GRAFTS  ANGIOGRAPHY N/A 08/28/2017   Procedure: LEFT HEART CATH AND CORS/GRAFTS ANGIOGRAPHY;  Surgeon: Leonie Man, MD;  Location: [place] [facility];  Service: Cardiovascular;  Laterality: N/A;  . PACEMAKER GENERATOR CHANGE  08/15/2009   St. Jude accent  . SUPRAPUBIC PROSTATECTOMY    . TEE WITHOUT CARDIOVERSION N/A 06/12/2015   Procedure: TRANSESOPHAGEAL ECHOCARDIOGRAM (TEE);  Surgeon: Sherren Mocha, MD;  Location: [place];  Service: Open Heart Surgery;  Laterality: N/A;  . TONSILLECTOMY    . TRANSCATHETER AORTIC VALVE REPLACEMENT, TRANSFEMORAL N/A 06/12/2015   Procedure: TRANSCATHETER AORTIC VALVE REPLACEMENT, TRANSFEMORAL, possible transpical;  Surgeon: Sherren Mocha, MD;  Location: [place] [place];  Service: Open Heart Surgery;  Laterality: N/A;    Allergies Patient has no known allergies.  Family History  Problem Relation Age of Onset  . Colon cancer Father   . Cancer Brother   . Heart Problems Brother   . Heart Problems Sister   . CAD Other   . Stomach cancer Neg Hx   . Pancreatic cancer Neg Hx   . Esophageal cancer Neg Hx      Social History Social History   Tobacco Use  . Smoking status: Former Smoker    Packs/day: 2.00    Years: 48.00    Pack years: 96.00    Types: Cigarettes    Quit date: 03/18/1995    Years since quitting: 24.4  . Smokeless tobacco: Never Used  Vaping Use  . Vaping Use: Never used  Substance Use Topics  . Alcohol use: No    Comment: 09/01/2017 "nothing in the last couple years"  . Drug use: Never    Review of Systems  Constitutional: No fever/chills Eyes: No visual changes. ENT: No sore throat. Cardiovascular: Denies chest pain. Respiratory: Denies shortness of breath. Gastrointestinal: No abdominal pain.  No nausea, no vomiting.  No diarrhea.  No constipation. G-tube dislodged.  Genitourinary: Negative for dysuria. Musculoskeletal: Negative for back pain. Skin: Negative for rash. Neurological: Negative for headaches, focal weakness or numbness.  10-point ROS otherwise negative.  ____________________________________________   PHYSICAL EXAM:  VITAL SIGNS: ED Triage Vitals [08/31/19 0953]  Enc Vitals Group     BP 111/66     Pulse Rate (!) 103     Resp 18     Temp 98.3 F (36.8 C)     Temp Source Oral     SpO2 (!) 89 %   Constitutional: Alert and oriented. Well appearing and in no acute distress. Eyes: Conjunctivae are normal.  Head: Atraumatic. Nose: No congestion/rhinnorhea. Mouth/Throat: Mucous membranes are moist. Neck: No stridor.  Cardiovascular: Normal rate, regular rhythm. Good peripheral circulation. Grossly normal heart sounds.   Respiratory: Normal respiratory effort.  No retractions. Lungs CTAB. Gastrointestinal: Soft and nontender. No distention. G-tube stoma is well appearing without bleeding or surrounding infection.  Musculoskeletal: No gross deformities of extremities. Neurologic: Muffled voice at baseline. No gross focal neurologic deficits are appreciated.  Skin:  Skin is warm, dry and intact. No rash  noted.  ____________________________________________  RADIOLOGY  DG Abdomen 1 View  Result Date: 08/31/2019 CLINICAL DATA:  Gastrostomy catheter placement EXAM: ABDOMEN - 1 VIEW COMPARISON:  Jul 26, 2019 FINDINGS: Gastrostomy catheter is positioned within the stomach. Contrast closed into the stomach without extravasation. Contrast flows from the stomach into the duodenum. No bowel dilatation or air-fluid levels to suggest bowel obstruction. No free air. Bibasilar atelectasis noted. IMPRESSION: Gastrostomy catheter positioned in the stomach. Contrast flows into the stomach without extravasation. Contrast flows freely from  stomach into duodenum. No bowel obstruction or free air. Electronically Signed   By: Lowella Grip III M.D.   On: 08/31/2019 11:07    ____________________________________________   PROCEDURES  Procedure(s) performed:   Gastrostomy tube replacement  Date/Time: 08/31/2019 9:58 AM Performed by: Margette Fast, MD Authorized by: Margette Fast, MD  Preparation: Patient was prepped and draped in the usual sterile fashion. Local anesthesia used: no  Anesthesia: Local anesthesia used: no  Sedation: Patient sedated: no  Patient tolerance: patient tolerated the procedure well with no immediate complications Comments: 8 French G-tube at bedside.  The stoma was cleaned with chlorhexidine and patient laid supine.  Minimal resistance at the skin surface.  No significant discomfort of bleeding.  Tube passed easily and balloon inflated with 9 mL of saline without discomfort.       ____________________________________________   INITIAL IMPRESSION / ASSESSMENT AND PLAN / ED COURSE  Pertinent labs & imaging results that were available during my care of the patient were reviewed by me and considered in my medical decision making (see chart for details).   Patient presents to the emergency department with dislodged G-tube.  Review of notes show the last tube in place  was a 11 [place].  Patient provided verbal consent to replace the tube at bedside.  This was done with no immediate pain or complication.  Will send for Gastrografin study to confirm placement.  11:30 AM  G-tube in good position. Stable for discharge.  ____________________________________________  FINAL CLINICAL IMPRESSION(S) / ED DIAGNOSES  Final diagnoses:  Dislodged gastrostomy tube     MEDICATIONS GIVEN DURING THIS VISIT:  Medications  diatrizoate meglumine-sodium (GASTROGRAFIN) 66-10 % solution 50 mL (has no administration in time range)  iohexol (OMNIPAQUE) 300 MG/ML solution 30 mL (30 mLs Per Tube Contrast Given 08/31/19 1102)    Note:  This document was prepared using Dragon voice recognition software and may include unintentional dictation errors.  Nanda Quinton, MD, [facility] Emergency Medicine    Dal Blew, [facility], MD 08/31/19 [phone]

## 2019-08-31 NOTE — H&P (Signed)
History and Physical    [employer] ZOX:[id number] DOB: [birth year] DOA: 08/31/2019  PCP: [name], MD Patient coming from: Home hospice  Chief Complaint: Rectal bleeding, altered mental status  HPI: [name]. is a 85 y.o. male with medical history significant of CAD status post CABG, COPD, paroxysmal A. fib, sick sinus syndrome status post PPM, CHF, hypertension, hyperlipidemia, OSA on CPAP, PVD, prostate cancer status post prostatectomy, esophageal squamous cell carcinoma currently on home hospice, dysphagia status post PEG placement, hoarseness secondary to left vocal cord paralysis presenting for evaluation of rectal bleeding and altered mental status.  He was seen in the ED earlier today for dislodged G-tube which was replaced and follow-up abdominal x-ray showed appropriate positioning.  Family reported that he was found slumped over on the commode earlier this evening with a large amount of bright red blood in the bowl.  He was lethargic and unresponsive.  At baseline he speaks in short words due to his cancer.  Daughter in law reported to ED provider that patient is DNR.  Patient remembers going to the bathroom last night and having a large bloody bowel movement.  He felt dizzy at the time.  Denies chest pain or shortness of breath.  Denies abdominal pain.  No other complaints.  ED Course: Tachycardic and hypotensive with systolic in the 81X.  Afebrile.  WBC count 3.7, hemoglobin 8.3, platelet count 31.  Pancytopenia appears to be chronic but hemoglobin has dropped, was 11.0 on 6/11.  FOBT positive and bright red blood noted on rectal exam.  BUN 54, creatinine 1.4.  Baseline creatinine 0.9.  Alk phos and T bili slightly elevated and consistent with prior labs.  SARS-CoV-2 PCR test negative.  G-tube was flushed and no blood noted.  Patient was given 2 L fluid boluses.  1 unit PRBCs ordered.  ED provider discussed the case with [facility], planning on pursuing  conservative management given that he is a terminally ill hospice patient.  GI will consult in a.m.  Review of Systems:  All systems reviewed and apart from history of presenting illness, are negative.  Past Medical History:  Diagnosis Date  . AAA (abdominal aortic aneurysm) ([place])    a. Last duplex - 3.8cm 01/2013 - due 01/2014.  [name] Acne rosacea   . Alcoholic hepatitis with ascites 08/28/2017  . Anginal pain ([place]) 1996  . Aortic stenosis, mild    Last echo 05/04/12 +LVH  . Arthritis    "shoulders" (09/01/2017)  . Back pain    hx epidural injections  . Baker's cyst    Lt.  [name] CAD (coronary artery disease)    a. s/p MI/PTCA - Cx 1994. b. s/p CABG x5 in 1997. c. Abnl nuc 2003- occ SVG-RCA, patent seq SVG-OM1-dCxOM, patentl LIMA-LAD, patent, SVG-diag. d. Normal nuc 09/2012.  . Carotid artery disease ([place])    a. Duplex 01/2013: mildly abnormal. Mild plaque without significant diameter reduction. Repeat recommended 11/15.  [name] COPD (chronic obstructive pulmonary disease) ([place])   . Dizziness and giddiness 08/04/2013  . GERD (gastroesophageal reflux disease)   . H/O: GI bleed    mild, neg. colonoscopy  . Heart murmur   . HOH (hard of hearing)   . HTN (hypertension)   . Hyperlipidemia   . Hypertensive heart disease   . Myocardial infarction ([place]) 1994  . OSA on CPAP   . OSA treated with BiPAP 11/20/2015   Severe with [facility] 38/hr  . PAF (paroxysmal atrial fibrillation) ([place])  a. Remote per records.   . Pneumonia    history  . Presence of permanent cardiac pacemaker    DOI 1999 with change out 2006; St Jude  . Prostate cancer ([place]) ~ 2010   a. s/p radical retropubic prostatectomy with bilateral pelvic lymph node dissection  . PVD (peripheral vascular disease) ([place])    a. Rt ext iliac stenosis, last PV cath 2003, moderate stenosis last Lower Ext Dopplers 12/16/11 - Rt. ABI 0.96  Lt ABI 1.0.  . Sick sinus syndrome ([place])    a. placed 1999, gen change 2011 - St. Jude.  . Sinus node dysfunction  ([place]) 01/20/2013  . Syncope 11/28/97  . Thrombocytopenia ([facility])    chronic, mild  . Valvular heart disease    a. Echo 04/2012: mild-mod AS, mild AI, mild MR.    Past Surgical History:  Procedure Laterality Date  . APPENDECTOMY    . BIOPSY  10/20/2017   Procedure: BIOPSY;  Surgeon: Doran Stabler, MD;  Location: [facility];  Service: Gastroenterology;;  . CARDIAC CATHETERIZATION  2003   VG to RCA occluded, other grafts patent  . CARDIAC CATHETERIZATION N/A 05/04/2015   Procedure: Right/Left Heart Cath and Coronary/Graft Angiography;  Surgeon: Sherren Mocha, MD;  Location: [place] [facility];  Service: Cardiovascular;  Laterality: N/A;  . CARDIAC CATHETERIZATION  09/01/2017  . CARDIAC VALVE REPLACEMENT    . CATARACT EXTRACTION W/ INTRAOCULAR LENS  IMPLANT, BILATERAL Bilateral   . COLONOSCOPY    . CORONARY ANGIOPLASTY  03/29/92   PTCA to LCX  . CORONARY ARTERY BYPASS GRAFT  1997   LIMA-LAD; VG-diag; seq VG-1st OM & distal LCX; VG-RCA  . CORONARY ATHERECTOMY N/A 09/01/2017   Procedure: CORONARY ATHERECTOMY;  Surgeon: Jettie Booze, MD;  Location: [place] [facility];  Service: Cardiovascular;  Laterality: N/A;  . CORONARY ATHERECTOMY  10/01/2017  . CORONARY ATHERECTOMY N/A 10/01/2017   Procedure: CORONARY ATHERECTOMY;  Surgeon: Leonie Man, MD;  Location: [place] [facility];  Service: Cardiovascular;  Laterality: N/A;  . ESOPHAGOGASTRODUODENOSCOPY (EGD) WITH PROPOFOL N/A 10/20/2017   Procedure: ESOPHAGOGASTRODUODENOSCOPY (EGD) WITH PROPOFOL;  Surgeon: Doran Stabler, MD;  Location: [facility];  Service: Gastroenterology;  Laterality: N/A;  . ESOPHAGOGASTRODUODENOSCOPY (EGD) WITH PROPOFOL N/A 03/29/2018   Procedure: ESOPHAGOGASTRODUODENOSCOPY (EGD) WITH PROPOFOL;  Surgeon: Doran Stabler, MD;  Location: [facility];  Service: Gastroenterology;  Laterality: N/A;  . INSERT / REPLACE / REMOVE PACEMAKER  11/28/1997   pacesetter--ERI 2011  . IR GASTROSTOMY TUBE MOD SED   07/26/2019  . IR IMAGING GUIDED PORT INSERTION  02/02/2019  . LEFT HEART CATH N/A 09/01/2017   Procedure: Left Heart Cath;  Surgeon: Jettie Booze, MD;  Location: [place] [facility];  Service: Cardiovascular;  Laterality: N/A;  . LEFT HEART CATH AND CORS/GRAFTS ANGIOGRAPHY N/A 08/28/2017   Procedure: LEFT HEART CATH AND CORS/GRAFTS ANGIOGRAPHY;  Surgeon: Leonie Man, MD;  Location: [place] [facility];  Service: Cardiovascular;  Laterality: N/A;  . PACEMAKER GENERATOR CHANGE  08/15/2009   St. Jude accent  . SUPRAPUBIC PROSTATECTOMY    . TEE WITHOUT CARDIOVERSION N/A 06/12/2015   Procedure: TRANSESOPHAGEAL ECHOCARDIOGRAM (TEE);  Surgeon: Sherren Mocha, MD;  Location: [facility];  Service: Open Heart Surgery;  Laterality: N/A;  . TONSILLECTOMY    . TRANSCATHETER AORTIC VALVE REPLACEMENT, TRANSFEMORAL N/A 06/12/2015   Procedure: TRANSCATHETER AORTIC VALVE REPLACEMENT, TRANSFEMORAL, possible transpical;  Surgeon: Sherren Mocha, MD;  Location: [facility];  Service: Open Heart Surgery;  Laterality: N/A;     reports that he quit smoking about 24 years ago. His smoking use included cigarettes. He has a 96.00 pack-year smoking history. He has never used smokeless tobacco. He reports that he does not drink alcohol and does not use drugs.  No Known Allergies  Family History  Problem Relation Age of Onset  . Colon cancer Father   . Cancer Brother   . Heart Problems Brother   . Heart Problems Sister   . CAD Other   . Stomach cancer Neg Hx   . Pancreatic cancer Neg Hx   . Esophageal cancer Neg Hx     Prior to Admission medications   Medication Sig Start Date End Date Taking? Authorizing Provider  acetaminophen (TYLENOL) 325 MG tablet Place 2 tablets (650 mg total) into feeding tube 2 (two) times daily. 07/27/19  Yes Kyle, Tyrone A, DO  amoxicillin (AMOXIL) 250 MG/5ML suspension Take 500 mg by mouth 2 (two) times daily. 08/29/19  Yes [provider]  famotidine (PEPCID) 20 MG tablet Place 20  mg into feeding tube 2 (two) times daily.  08/17/19  Yes [provider]  haloperidol (HALDOL) 1 MG tablet Place 1 mg into feeding tube daily.  08/17/19  Yes [provider]  HYDROcodone-acetaminophen (HYCET) 7.5-325 mg/15 ml solution Place 15 mLs into feeding tube every 6 (six) hours as needed for moderate pain or severe pain. 08/26/19  Yes Owens Shark, NP  lidocaine-prilocaine (EMLA) cream Apply to port site one hour prior to use 01/20/19  Yes Owens Shark, NP  LORazepam (ATIVAN) 0.5 MG tablet Place 0.5 mg into feeding tube in the morning, at noon, in the evening, and at bedtime.    Yes [provider]  metoCLOPramide (REGLAN) 10 MG/10ML SOLN Place 10 mLs into feeding tube in the morning, at noon, in the evening, and at bedtime.  08/17/19  Yes [provider]  nitroGLYCERIN (NITROSTAT) 0.4 MG SL tablet PLACE 1 TABLET UNDER THE TONGUE EVERY 5 MINUTES AS NEEDED FOR CHEST PAIN UP TO 3 DOSES, IF SYMPTOMS PERSIST CALL 911 Patient taking differently: Place 0.4 mg under the tongue every 5 (five) minutes as needed for chest pain.  01/05/19  Yes Lyda Jester M, PA-C  NON FORMULARY Apply 1 application topically See admin instructions. [place] pain cream: Rub into both shoulders 2 times a day   Yes [provider]  Nutritional Supplements (FEEDING SUPPLEMENT, JEVITY 1.2 CAL,) LIQD Place 237 mLs into feeding tube 5 (five) times daily. With water flush 60 ml before/after   Yes [provider]  omeprazole (PRILOSEC) 20 MG capsule 20 mg in the morning and at bedtime. tube   Yes [provider]  ondansetron (ZOFRAN) 4 MG tablet Take 4 mg by mouth every 8 (eight) hours as needed for nausea.  08/25/19  Yes [provider]  prochlorperazine (COMPAZINE) 5 MG tablet Place 1 tablet (5 mg total) into feeding tube every 6 (six) hours as needed for nausea or vomiting. 07/27/19  Yes Marylyn Ishihara, Tyrone A, DO  fluconazole (DIFLUCAN) 10 MG/ML suspension Place 10  mLs (100 mg total) into feeding tube daily. Take x 5 days per G-tube Patient not taking: Reported on 08/31/2019 08/05/19   Ladell Pier, MD  metoprolol tartrate (LOPRESSOR) 25 MG tablet Place 1 tablet (25 mg total) into feeding tube 2 (two) times daily. Patient not taking: Reported on 08/31/2019 07/27/19   Jonnie Finner, DO    Physical Exam: Vitals:   08/31/19  2230 08/31/19 2300 08/31/19 2330 09/01/19 0116  BP: 105/79 (!) 93/54 104/71 (!) 90/47  Pulse: (!) 120 (!) 115 (!) 108 (!) 109  Resp: _0 Temp:      TempSrc:      SpO2: 99% 100% 100% 99%    Physical Exam Constitutional:      General: He is not in acute distress. HENT:     Head: Normocephalic.  Eyes:     Extraocular Movements: Extraocular movements intact.  Cardiovascular:     Rate and Rhythm: Normal rate and regular rhythm.     Pulses: Normal pulses.  Pulmonary:     Effort: Pulmonary effort is normal. No respiratory distress.     Breath sounds: No wheezing.     Comments: Decreased breath sounds at the bases Abdominal:     General: Bowel sounds are normal. There is no distension.     Palpations: Abdomen is soft.     Tenderness: There is no abdominal tenderness. There is no guarding.     Comments: G-tube in place  Musculoskeletal:     Cervical back: Neck supple.     Comments: +2 pedal edema  Skin:    General: Skin is warm and dry.  Neurological:     Mental Status: He is alert and oriented to person, place, and time.     Comments: No facial droop No focal motor or sensory deficit     Labs on Admission: I have personally reviewed following labs and imaging studies  CBC: Recent Labs  Lab 08/26/19 1335 08/31/19 2138 08/31/19 2347  WBC 3.8* 3.7* 2.7*  NEUTROABS 3.1 3.0  --   HGB 11.0* 8.3* 7.0*  HCT 33.3* 26.5* 22.4*  MCV 91.0 98.5 97.4  PLT 34* 31* 28*   Basic Metabolic Panel: Recent Labs  Lab 08/26/19 1335 08/31/19 2138 08/31/19 2347  NA 132* 141  --   K 4.4 4.1  --   CL 98 107  --     CO2 27 22  --   GLUCOSE 57* 174*  --   BUN 23 54*  --   CREATININE 0.96 1.49*  --   CALCIUM 8.1* 7.5*  --   MG  --   --  2.4   GFR: Estimated Creatinine Clearance: 35.1 mL/min (A) (by C-G formula based on SCr of 1.49 mg/dL (H)). Liver Function Tests: Recent Labs  Lab 08/26/19 1335 08/31/19 2138  AST 28 29  ALT 32 24  ALKPHOS 273* 177*  BILITOT 2.0* 2.0*  PROT 5.8* 4.9*  ALBUMIN 2.1* 1.8*   No results for input(s): LIPASE, AMYLASE in the last 168 hours. No results for input(s): AMMONIA in the last 168 hours. Coagulation Profile: No results for input(s): INR, PROTIME in the last 168 hours. Cardiac Enzymes: No results for input(s): CKTOTAL, CKMB, CKMBINDEX, TROPONINI in the last 168 hours. BNP (last 3 results) No results for input(s): PROBNP in the last 8760 hours. HbA1C: No results for input(s): HGBA1C in the last 72 hours. CBG: No results for input(s): GLUCAP in the last 168 hours. Lipid Profile: No results for input(s): CHOL, HDL, LDLCALC, TRIG, CHOLHDL, LDLDIRECT in the last 72 hours. Thyroid Function Tests: No results for input(s): TSH, T4TOTAL, FREET4, T3FREE, THYROIDAB in the last 72 hours. Anemia Panel: No results for input(s): VITAMINB12, FOLATE, FERRITIN, TIBC, IRON, RETICCTPCT in the last 72 hours. Urine analysis:    Component Value Date/Time   COLORURINE YELLOW 06/08/2015 [place] 06/08/2015 1056  LABSPEC 1.013 06/08/2015 1056   PHURINE 7.5 06/08/2015 1056   GLUCOSEU NEGATIVE 06/08/2015 1056   HGBUR TRACE (A) 06/08/2015 1056   BILIRUBINUR NEGATIVE 06/08/2015 1056   KETONESUR NEGATIVE 06/08/2015 1056   PROTEINUR NEGATIVE 06/08/2015 1056   UROBILINOGEN 0.2 09/26/2009 1906   NITRITE NEGATIVE 06/08/2015 1056   LEUKOCYTESUR NEGATIVE 06/08/2015 1056    Radiological Exams on Admission: DG Abd 1 View  Result Date: 09/01/2019 CLINICAL DATA:  GI bleed, abdominal pain EXAM: ABDOMEN - 1 VIEW COMPARISON:  08/31/2019 FINDINGS: Gastrostomy tube  projects over the stomach. Nonobstructive bowel gas pattern. No organomegaly or free air. IMPRESSION: No acute findings. Electronically Signed   By: Rolm Baptise M.D.   On: 09/01/2019 00:25   DG Abdomen 1 View  Result Date: 08/31/2019 CLINICAL DATA:  Gastrostomy catheter placement EXAM: ABDOMEN - 1 VIEW COMPARISON:  Jul 26, 2019 FINDINGS: Gastrostomy catheter is positioned within the stomach. Contrast closed into the stomach without extravasation. Contrast flows from the stomach into the duodenum. No bowel dilatation or air-fluid levels to suggest bowel obstruction. No free air. Bibasilar atelectasis noted. IMPRESSION: Gastrostomy catheter positioned in the stomach. Contrast flows into the stomach without extravasation. Contrast flows freely from stomach into duodenum. No bowel obstruction or free air. Electronically Signed   By: Lowella Grip III M.D.   On: 08/31/2019 11:07    EKG: Independently reviewed.  Sinus or ectopic atrial tachycardia, LBBB, QTC 513.  Rate increased since prior tracing.  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  Hemoglobin 8.3, was 11.0 on 6/11.  FOBT positive and bright red blood noted on rectal exam.  He was seen in the ED 6/16 a.m. for dislodged G-tube which was replaced.  X-ray showing appropriate positioning.  G-tube was flushed in the ED and no blood noted. -Admit to stepdown unit.  Continue IV fluid resuscitation and monitor hemodynamics.  No further episodes of hematochezia since he has been in the ED.  Serial H&H.  1 unit PRBCs ordered.  ED provider discussed the case with [facility], planning on pursuing conservative management given he is a terminally ill patient  on hospice.  GI will consult in a.m.  Altered mental status: He was lethargic and unresponsive when found by family.  Likely due to hypotension in setting of acute GI bleed.  Currently AAO x3.  Neuro exam nonfocal.  AKI: Likely prerenal in the setting of GI bleed/hypotension. BUN 54, creatinine 1.4.  Baseline creatinine 0.9. -IV fluid, repeat BMP in a.m.  Esophageal squamous cell carcinoma: Followed by oncology and currently on home hospice.  Pancytopenia: In the setting of cancer and previous chemotherapy.  Hemoglobin has dropped in the setting of acute GI bleed.  WBC count and platelet low but stable compared to prior labs. -Continue to monitor CBC  Chronic systolic CHF: LVEF 45 to 49% on last echo done October 2020.  Has pedal edema and decreased breath sounds at the bases on auscultation of the lungs.  No rales appreciated.  Not hypoxic.  Received fluid boluses for hypotension. -Order chest x-ray  OSA -Continue CPAP at night  QT prolongation on EKG -Cardiac monitoring.  Keep potassium above 4 and magnesium above 2.  Avoid QT  prolonging drugs if possible.  Repeat EKG in a.m.  DVT prophylaxis: SCDs Code Status: DNR-confirmed with patient Family Communication: No family available at this time. Disposition Plan: Status is: Inpatient  Remains inpatient appropriate because:Hemodynamically unstable, Ongoing diagnostic testing needed not appropriate for outpatient work up, IV treatments appropriate due to intensity of illness or inability to take PO and Inpatient level of care appropriate due to severity of illness   Dispo: The patient is from: Home hospice              Anticipated d/c is to: Home hospice              Anticipated d/c date is: 3 days              Patient currently is not medically stable to d/c.  The medical decision making on this patient was of high complexity and the patient is at high risk for clinical deterioration, therefore this is a level 3 visit.  Shela Leff MD [facility]  If 7PM-7AM, please contact night-coverage [email or web address]  09/01/2019, 1:50 AM

## 2019-08-31 NOTE — ED Triage Notes (Signed)
Patient reportedly has his feeding tube pulled out.

## 2019-08-31 NOTE — ED Provider Notes (Signed)
[facility] DEPT Provider Note   CSN: [id number] Arrival date & time: 08/31/19  2056     History Chief complaint: GI bleed   Curtis Alban. is a 85 y.o. male presented for the evaluation of BRBPR. He currently is in home hospice due to squamous cell esophageal cancer. Past medical history additionally notable for sick sinus syndrome/paroxysmal A. Fib/LBBB with pacemaker in place, CAD, and hypertension.   Spoke with patient and his daughter-in-law, Danton Clap, who provided additional history. He was seen in the ED earlier today due to dislodged G-tube, this was replaced without concern. Abdominal x-ray obtained after placement showing appropriate position. Danton Clap reports that his daughter found him slumped over on the commode earlier this evening with a large amount of bright red blood in the bowl. He was lethargic and nonresponsive. At baseline, he speaks short words due to his cancer.  Denies any LOC, falls, nausea, or vomiting.  Only has some mild abdominal discomfort where his G-tube was replaced.     Past Medical History:  Diagnosis Date  . AAA (abdominal aortic aneurysm) ([place])    a. Last duplex - 3.8cm 01/2013 - due 01/2014.  [name] Acne rosacea   . Alcoholic hepatitis with ascites 08/28/2017  . Anginal pain ([place]) 1996  . Aortic stenosis, mild    Last echo 05/04/12 +LVH  . Arthritis    "shoulders" (09/01/2017)  . Back pain    hx epidural injections  . Baker's cyst    Lt.  [name] CAD (coronary artery disease)    a. s/p MI/PTCA - Cx 1994. b. s/p CABG x5 in 1997. c. Abnl nuc 2003- occ SVG-RCA, patent seq SVG-OM1-dCxOM, patentl LIMA-LAD, patent, SVG-diag. d. Normal nuc 09/2012.  . Carotid artery disease ([place])    a. Duplex 01/2013: mildly abnormal. Mild plaque without significant diameter reduction. Repeat recommended 11/15.  [name] COPD (chronic obstructive pulmonary disease) ([place])   . Dizziness and giddiness 08/04/2013  . GERD (gastroesophageal reflux disease)   . H/O:  GI bleed    mild, neg. colonoscopy  . Heart murmur   . HOH (hard of hearing)   . HTN (hypertension)   . Hyperlipidemia   . Hypertensive heart disease   . Myocardial infarction ([place]) 1994  . OSA on CPAP   . OSA treated with BiPAP 11/20/2015   Severe with [facility] 38/hr  . PAF (paroxysmal atrial fibrillation) ([place])    a. Remote per records.   . Pneumonia    history  . Presence of permanent cardiac pacemaker    DOI 1999 with change out 2006; St Jude  . Prostate cancer ([place]) ~ 2010   a. s/p radical retropubic prostatectomy with bilateral pelvic lymph node dissection  . PVD (peripheral vascular disease) ([place])    a. Rt ext iliac stenosis, last PV cath 2003, moderate stenosis last Lower Ext Dopplers 12/16/11 - Rt. ABI 0.96  Lt ABI 1.0.  . Sick sinus syndrome ([place])    a. placed 1999, gen change 2011 - St. Jude.  . Sinus node dysfunction ([place]) 01/20/2013  . Syncope 11/28/97  . Thrombocytopenia (HCC)    chronic, mild  . Valvular heart disease    a. Echo 04/2012: mild-mod AS, mild AI, mild MR.    Patient Active Problem List   Diagnosis Date Noted  . GI bleed 08/31/2019  . Malnutrition of moderate degree 07/27/2019  . DNR (do not resuscitate)   . Failure to thrive in adult   . Odynophagia   . Palliative care  by specialist   . Cough with hemoptysis 07/19/2019  . Hemoptysis   . VT (ventricular tachycardia) ([place]) 05/22/2019  . LBBB (left bundle branch block) 05/22/2019  . Port-A-Cath in place 02/21/2019  . Goals of care, counseling/discussion 01/20/2019  . Unstable angina ([place])   . Presbycusis of both ears 04/08/2018  . Squamous cell esophageal cancer ([place])   . Anticoagulated by anticoagulation treatment 03/19/2018  . Epistaxis 03/19/2018  . Pacemaker 02/19/2018  . Coronary artery disease involving coronary bypass graft of native heart without angina pectoris 02/19/2018  . Anemia 01/19/2018  . Hypercholesterolemia 12/28/2017  . Encounter for antineoplastic chemotherapy 12/08/2017  .  Malignant tumor of middle third of esophagus ([place]) 10/28/2017  . Melena   . Heme positive stool   . Acute post-hemorrhagic anemia   . Esophageal mass   . Angina, class III ([place]) 10/01/2017  . Chest pain with moderate risk for cardiac etiology 09/01/2017  . Chest pain, rule out acute myocardial infarction 08/26/2017  . Essential hypertension 05/14/2016  . OSA (obstructive sleep apnea) 11/20/2015  . Obesity (BMI 30-39.9) 11/20/2015  . S/P TAVR (transcatheter aortic valve replacement) 06/12/2015  . Severe aortic stenosis 06/05/2015  . AAA (abdominal aortic aneurysm) without rupture ([place]) 04/27/2015  . PAD (peripheral artery disease) ([place]) 04/27/2015  . Dizziness and giddiness 08/04/2013  . Dizziness 07/19/2013  . SSS (sick sinus syndrome) ([place]) 01/20/2013  . Hypertensive cardiovascular disease 01/20/2013  . Paroxysmal atrial fibrillation (HCC)   . CAD, multiple vessel   . Pacemaker -[employer]   . Aortic stenosis 05/07/2012    Class: Diagnosis of  . Syncope 11/28/1997    Past Surgical History:  Procedure Laterality Date  . APPENDECTOMY    . BIOPSY  10/20/2017   Procedure: BIOPSY;  Surgeon: Doran Stabler, MD;  Location: [facility];  Service: Gastroenterology;;  . CARDIAC CATHETERIZATION  2003   VG to RCA occluded, other grafts patent  . CARDIAC CATHETERIZATION N/A 05/04/2015   Procedure: Right/Left Heart Cath and Coronary/Graft Angiography;  Surgeon: Sherren Mocha, MD;  Location: [facility];  Service: Cardiovascular;  Laterality: N/A;  . CARDIAC CATHETERIZATION  09/01/2017  . CARDIAC VALVE REPLACEMENT    . CATARACT EXTRACTION W/ INTRAOCULAR LENS  IMPLANT, BILATERAL Bilateral   . COLONOSCOPY    . CORONARY ANGIOPLASTY  03/29/92   PTCA to LCX  . CORONARY ARTERY BYPASS GRAFT  1997   LIMA-LAD; VG-diag; seq VG-1st OM & distal LCX; VG-RCA  . CORONARY ATHERECTOMY N/A 09/01/2017   Procedure: CORONARY ATHERECTOMY;  Surgeon: Jettie Booze, MD;  Location: [facility]  [facility];  Service: Cardiovascular;  Laterality: N/A;  . CORONARY ATHERECTOMY  10/01/2017  . CORONARY ATHERECTOMY N/A 10/01/2017   Procedure: CORONARY ATHERECTOMY;  Surgeon: Leonie Man, MD;  Location: [facility];  Service: Cardiovascular;  Laterality: N/A;  . ESOPHAGOGASTRODUODENOSCOPY (EGD) WITH PROPOFOL N/A 10/20/2017   Procedure: ESOPHAGOGASTRODUODENOSCOPY (EGD) WITH PROPOFOL;  Surgeon: Doran Stabler, MD;  Location: [facility];  Service: Gastroenterology;  Laterality: N/A;  . ESOPHAGOGASTRODUODENOSCOPY (EGD) WITH PROPOFOL N/A 03/29/2018   Procedure: ESOPHAGOGASTRODUODENOSCOPY (EGD) WITH PROPOFOL;  Surgeon: Doran Stabler, MD;  Location: [facility];  Service: Gastroenterology;  Laterality: N/A;  . INSERT / REPLACE / REMOVE PACEMAKER  11/28/1997   pacesetter--ERI 2011  . IR GASTROSTOMY TUBE MOD SED  07/26/2019  . IR IMAGING GUIDED PORT INSERTION  02/02/2019  . LEFT HEART CATH N/A 09/01/2017   Procedure: Left Heart Cath;  Surgeon: Irish Lack,  Charlann Lange, MD;  Location: [facility];  Service: Cardiovascular;  Laterality: N/A;  . LEFT HEART CATH AND CORS/GRAFTS ANGIOGRAPHY N/A 08/28/2017   Procedure: LEFT HEART CATH AND CORS/GRAFTS ANGIOGRAPHY;  Surgeon: Leonie Man, MD;  Location: [facility];  Service: Cardiovascular;  Laterality: N/A;  . PACEMAKER GENERATOR CHANGE  08/15/2009   St. Jude accent  . SUPRAPUBIC PROSTATECTOMY    . TEE WITHOUT CARDIOVERSION N/A 06/12/2015   Procedure: TRANSESOPHAGEAL ECHOCARDIOGRAM (TEE);  Surgeon: Sherren Mocha, MD;  Location: [facility];  Service: Open Heart Surgery;  Laterality: N/A;  . TONSILLECTOMY    . TRANSCATHETER AORTIC VALVE REPLACEMENT, TRANSFEMORAL N/A 06/12/2015   Procedure: TRANSCATHETER AORTIC VALVE REPLACEMENT, TRANSFEMORAL, possible transpical;  Surgeon: Sherren Mocha, MD;  Location: [facility];  Service: Open Heart Surgery;  Laterality: N/A;       Family History  Problem Relation Age of Onset  . Colon cancer Father   .  Cancer Brother   . Heart Problems Brother   . Heart Problems Sister   . CAD Other   . Stomach cancer Neg Hx   . Pancreatic cancer Neg Hx   . Esophageal cancer Neg Hx     Social History   Tobacco Use  . Smoking status: Former Smoker    Packs/day: 2.00    Years: 48.00    Pack years: 96.00    Types: Cigarettes    Quit date: 03/18/1995    Years since quitting: 24.4  . Smokeless tobacco: Never Used  Vaping Use  . Vaping Use: Never used  Substance Use Topics  . Alcohol use: No    Comment: 09/01/2017 "nothing in the last couple years"  . Drug use: Never    Home Medications Prior to Admission medications   Medication Sig Start Date End Date Taking? Authorizing Provider  acetaminophen (TYLENOL) 325 MG tablet Place 2 tablets (650 mg total) into feeding tube 2 (two) times daily. 07/27/19  Yes Kyle, Tyrone A, DO  amoxicillin (AMOXIL) 250 MG/5ML suspension Take 500 mg by mouth 2 (two) times daily. 08/29/19  Yes [provider]  famotidine (PEPCID) 20 MG tablet Place 20 mg into feeding tube 2 (two) times daily.  08/17/19  Yes [provider]  haloperidol (HALDOL) 1 MG tablet Place 1 mg into feeding tube daily.  08/17/19  Yes [provider]  HYDROcodone-acetaminophen (HYCET) 7.5-325 mg/15 ml solution Place 15 mLs into feeding tube every 6 (six) hours as needed for moderate pain or severe pain. 08/26/19  Yes Owens Shark, NP  lidocaine-prilocaine (EMLA) cream Apply to port site one hour prior to use 01/20/19  Yes Owens Shark, NP  LORazepam (ATIVAN) 0.5 MG tablet Place 0.5 mg into feeding tube in the morning, at noon, in the evening, and at bedtime.    Yes [provider]  metoCLOPramide (REGLAN) 10 MG/10ML SOLN Place 10 mLs into feeding tube in the morning, at noon, in the evening, and at bedtime.  08/17/19  Yes [provider]  nitroGLYCERIN (NITROSTAT) 0.4 MG SL tablet PLACE 1 TABLET UNDER THE TONGUE EVERY 5 MINUTES AS NEEDED FOR CHEST PAIN UP TO 3  DOSES, IF SYMPTOMS PERSIST CALL 911 Patient taking differently: Place 0.4 mg under the tongue every 5 (five) minutes as needed for chest pain.  01/05/19  Yes Lyda Jester M, PA-C  NON FORMULARY Apply 1 application topically See admin instructions. Tridema pain cream: Rub into both shoulders 2 times a day   Yes [provider]  Nutritional Supplements (FEEDING SUPPLEMENT, JEVITY 1.2 CAL,) LIQD Place 237 mLs into feeding tube 5 (five) times daily. With water flush 60 ml before/after   Yes [provider]  omeprazole (PRILOSEC) 20 MG capsule 20 mg in the morning and at bedtime. tube   Yes [provider]  ondansetron (ZOFRAN) 4 MG tablet Take 4 mg by mouth every 8 (eight) hours as needed for nausea.  08/25/19  Yes [provider]  prochlorperazine (COMPAZINE) 5 MG tablet Place 1 tablet (5 mg total) into feeding tube every 6 (six) hours as needed for nausea or vomiting. 07/27/19  Yes Marylyn Ishihara, Tyrone A, DO  fluconazole (DIFLUCAN) 10 MG/ML suspension Place 10 mLs (100 mg total) into feeding tube daily. Take x 5 days per G-tube Patient not taking: Reported on 08/31/2019 08/05/19   Ladell Pier, MD  metoprolol tartrate (LOPRESSOR) 25 MG tablet Place 1 tablet (25 mg total) into feeding tube 2 (two) times daily. Patient not taking: Reported on 08/31/2019 07/27/19   Cherylann Ratel A, DO    Allergies    Patient has no known allergies.  Review of Systems   Review of Systems  Constitutional: Negative for chills and fever.  Respiratory: Negative for shortness of breath.   Cardiovascular: Negative for chest pain and palpitations.  Gastrointestinal: Positive for abdominal pain and blood in stool. Negative for nausea, rectal pain and vomiting.  Genitourinary: Negative for dysuria and hematuria.  Skin: Negative for rash.  Neurological: Negative for dizziness and light-headedness.    Physical Exam Updated Vital Signs BP 104/71 (BP Location: Right Arm)   Pulse (!) 108    Temp (!) 97.4 F (36.3 C) (Oral)   Resp 14   SpO2 100%   Physical Exam Constitutional:      General: He is not in acute distress. HENT:     Head: Normocephalic and atraumatic.     Mouth/Throat:     Mouth: Mucous membranes are dry.  Eyes:     Extraocular Movements: Extraocular movements intact.  Cardiovascular:     Rate and Rhythm: Tachycardia present. Rhythm irregular.  Pulmonary:     Effort: Pulmonary effort is normal.     Breath sounds: Normal breath sounds.  Abdominal:     General: Abdomen is flat.     Palpations: Abdomen is soft.     Tenderness: There is no guarding or rebound.     Comments: G-tube with dressing in place, no blood seen within tube.  No surrounding erythema or swelling.  No abdominal distention, nontender to palpation of abdomen throughout with the exception of mild tenderness at G-tube site.  Genitourinary:    Comments: Rectal exam: Pilar Plate bright red blood at anal opening and saturated through underwear padding. No noted mass, lesions or tenderness, no external hemorrhoids or anal fissure noted.  Skin:    General: Skin is dry.  Neurological:     General: No focal deficit present.     Mental Status: He is alert and oriented to person, place, and time.  Psychiatric:        Mood and Affect: Mood normal.        Behavior: Behavior normal.     ED Results / Procedures / Treatments   Labs (all labs ordered are listed, but only abnormal results are displayed) Labs Reviewed  CBC WITH DIFFERENTIAL/PLATELET - Abnormal; Notable for the following components:      Result Value   WBC 3.7 (*)    RBC 2.69 (*)    Hemoglobin 8.3 (*)  HCT 26.5 (*)    RDW 20.6 (*)    Platelets 31 (*)    nRBC 0.5 (*)    Lymphs Abs 0.4 (*)    All other components within normal limits  COMPREHENSIVE METABOLIC PANEL - Abnormal; Notable for the following components:   Glucose, Bld 174 (*)    BUN 54 (*)    Creatinine, Ser 1.49 (*)    Calcium 7.5 (*)    Total Protein 4.9 (*)     Albumin 1.8 (*)    Alkaline Phosphatase 177 (*)    Total Bilirubin 2.0 (*)    GFR calc non Af Amer 43 (*)    GFR calc Af Amer 50 (*)    All other components within normal limits  POC OCCULT BLOOD, ED - Abnormal; Notable for the following components:   Fecal Occult Bld POSITIVE (*)    All other components within normal limits  SARS CORONAVIRUS 2 BY RT PCR (HOSPITAL ORDER, [facility])  CBC  CBC  CBC  MAGNESIUM  TYPE AND SCREEN  PREPARE RBC (CROSSMATCH)    EKG EKG Interpretation  Date/Time:  Wednesday August 31 2019 21:40:12 EDT Ventricular Rate:  121 PR Interval:    QRS Duration: 148 QT Interval:  361 QTC Calculation: 513 R Axis:   73 Text Interpretation: Sinus or ectopic atrial tachycardia Left bundle branch block Since last tracing rate faster Confirmed by Wandra Arthurs [phone]) on 08/31/2019 10:04:52 PM   Radiology DG Abdomen 1 View  Result Date: 08/31/2019 CLINICAL DATA:  Gastrostomy catheter placement EXAM: ABDOMEN - 1 VIEW COMPARISON:  Jul 26, 2019 FINDINGS: Gastrostomy catheter is positioned within the stomach. Contrast closed into the stomach without extravasation. Contrast flows from the stomach into the duodenum. No bowel dilatation or air-fluid levels to suggest bowel obstruction. No free air. Bibasilar atelectasis noted. IMPRESSION: Gastrostomy catheter positioned in the stomach. Contrast flows into the stomach without extravasation. Contrast flows freely from stomach into duodenum. No bowel obstruction or free air. Electronically Signed   By: Lowella Grip III M.D.   On: 08/31/2019 11:07    Procedures Procedures (including critical care time)  Medications Ordered in ED Medications  0.9 %  sodium chloride infusion (has no administration in time range)  0.9 %  sodium chloride infusion (has no administration in time range)  sodium chloride 0.9 % bolus 1,000 mL (1,000 mLs Intravenous New Bag/Given 08/31/19 2158)  sodium chloride 0.9 % bolus  1,000 mL (1,000 mLs Intravenous New Bag/Given 08/31/19 2311)    ED Course  I have reviewed the triage vital signs and the nursing notes.  Pertinent labs & imaging results that were available during my care of the patient were reviewed by me and considered in my medical decision making (see chart for details).    MDM Rules/Calculators/A&P                          85 year old gentleman with esophageal cancer and liver cirrhosis on home hospice presenting for evaluation of acute onset BRBPR. Tachycardic, otherwise hemodynamically stable on arrival.  Responsive, answering questions appropriately. Bright red blood evident on rectal visualization without hemorrhoids or fissures present, Hemoccult positive.  Hemoglobin down to 8.3, baseline around 11.  Mild AKI with Cr 1.49, BL 1.0. Thrombocytopenia stable since 6/11.  EKG LBBB with sinus tachycardia, similar to previous.  Unclear etiology of LGIB, possibly diverticular bleed.  While he did have his G-tube replaced earlier today,  there is no evidence of bleeding through tube after flushing.  Spoke with [facility] and will evaluate the patient tomorrow.  On reevaluation, noted his blood pressure decreasing to 93/54, ordered 1 unit pRBCs as hemoglobin likely lower than reported at this time. Received 1L NS bolus x2.   Briefly discussed goals of care with patient and his daughter-in-law at bedside.  He is a DNR but requests admission for stabilization and observation.   Spoke with hospitalist team, will admit for further management.  Requested updated abdominal XR, will obtain this.   Final Clinical Impression(s) / ED Diagnoses Final diagnoses:  Gastrointestinal hemorrhage, unspecified gastrointestinal hemorrhage type    Rx / DC Orders ED Discharge Orders    None       Patriciaann Clan, DO 09/01/19 0006    Drenda Freeze, MD 09/02/19 [phone]

## 2019-08-31 NOTE — Discharge Instructions (Signed)
Your g-tube was replaced in the ED and is in the correct location here. You can use it as you normally would. Call your PCP to discuss your ED visit and develop a follow up plan. Return to the ED with any new or worsening symptoms.

## 2019-09-01 ENCOUNTER — Inpatient Hospital Stay (HOSPITAL_COMMUNITY)

## 2019-09-01 ENCOUNTER — Ambulatory Visit: Payer: Medicare Other

## 2019-09-01 DIAGNOSIS — C154 Malignant neoplasm of middle third of esophagus: Secondary | ICD-10-CM

## 2019-09-01 DIAGNOSIS — K922 Gastrointestinal hemorrhage, unspecified: Secondary | ICD-10-CM

## 2019-09-01 DIAGNOSIS — N179 Acute kidney failure, unspecified: Secondary | ICD-10-CM

## 2019-09-01 DIAGNOSIS — D62 Acute posthemorrhagic anemia: Secondary | ICD-10-CM

## 2019-09-01 DIAGNOSIS — R4182 Altered mental status, unspecified: Secondary | ICD-10-CM

## 2019-09-01 DIAGNOSIS — J9601 Acute respiratory failure with hypoxia: Secondary | ICD-10-CM | POA: Diagnosis present

## 2019-09-01 LAB — CBC
HCT: 22.4 % — ABNORMAL LOW (ref 39.0–52.0)
HCT: 29 % — ABNORMAL LOW (ref 39.0–52.0)
HCT: 29.6 % — ABNORMAL LOW (ref 39.0–52.0)
Hemoglobin: 7 g/dL — ABNORMAL LOW (ref 13.0–17.0)
Hemoglobin: 9.4 g/dL — ABNORMAL LOW (ref 13.0–17.0)
Hemoglobin: 9.5 g/dL — ABNORMAL LOW (ref 13.0–17.0)
MCH: 30.4 pg (ref 26.0–34.0)
MCH: 30.3 pg (ref 26.0–34.0)
MCH: 30.1 pg (ref 26.0–34.0)
MCHC: 31.3 g/dL (ref 30.0–36.0)
MCHC: 32.4 g/dL (ref 30.0–36.0)
MCHC: 32.1 g/dL (ref 30.0–36.0)
MCV: 97.4 fL (ref 80.0–100.0)
MCV: 93.5 fL (ref 80.0–100.0)
MCV: 93.7 fL (ref 80.0–100.0)
Platelets: 28 10*3/uL — CL (ref 150–400)
Platelets: 29 10*3/uL — CL (ref 150–400)
Platelets: 28 10*3/uL — CL (ref 150–400)
RBC: 2.3 MIL/uL — ABNORMAL LOW (ref 4.22–5.81)
RBC: 3.1 MIL/uL — ABNORMAL LOW (ref 4.22–5.81)
RBC: 3.16 MIL/uL — ABNORMAL LOW (ref 4.22–5.81)
RDW: 20.1 % — ABNORMAL HIGH (ref 11.5–15.5)
RDW: 18.5 % — ABNORMAL HIGH (ref 11.5–15.5)
RDW: 18.7 % — ABNORMAL HIGH (ref 11.5–15.5)
WBC: 2.7 10*3/uL — ABNORMAL LOW (ref 4.0–10.5)
WBC: 2.8 10*3/uL — ABNORMAL LOW (ref 4.0–10.5)
WBC: 3.2 10*3/uL — ABNORMAL LOW (ref 4.0–10.5)
nRBC: 0 % (ref 0.0–0.2)
nRBC: 0.7 % — ABNORMAL HIGH (ref 0.0–0.2)
nRBC: 0.6 % — ABNORMAL HIGH (ref 0.0–0.2)

## 2019-09-01 LAB — PREPARE RBC (CROSSMATCH)

## 2019-09-01 LAB — MAGNESIUM: Magnesium: 2.4 mg/dL (ref 1.7–2.4)

## 2019-09-01 LAB — BASIC METABOLIC PANEL
Anion gap: 6 (ref 5–15)
BUN: 47 mg/dL — ABNORMAL HIGH (ref 8–23)
CO2: 26 mmol/L (ref 22–32)
Calcium: 7 mg/dL — ABNORMAL LOW (ref 8.9–10.3)
Chloride: 109 mmol/L (ref 98–111)
Creatinine, Ser: 1.1 mg/dL (ref 0.61–1.24)
GFR calc Af Amer: >60 mL/min (ref >60)
GFR calc non Af Amer: >60 mL/min (ref >60)
Glucose, Bld: 123 mg/dL — ABNORMAL HIGH (ref 70–99)
Potassium: 4.1 mmol/L (ref 3.5–5.1)
Sodium: 141 mmol/L (ref 135–145)

## 2019-09-01 LAB — PROCALCITONIN: Procalcitonin: 0.57 ng/mL

## 2019-09-01 MED ORDER — METOPROLOL TARTRATE 5 MG/5ML IV SOLN
5.0000 mg | Freq: Once | INTRAVENOUS | Status: AC
  Administered 2019-09-02: 5 mg via INTRAVENOUS
  Filled 2019-09-01: qty 5

## 2019-09-01 MED ORDER — SODIUM CHLORIDE 0.9 % IV BOLUS
250.0000 mL | Freq: Once | INTRAVENOUS | Status: AC
  Administered 2019-09-01: 250 mL via INTRAVENOUS

## 2019-09-01 NOTE — ED Notes (Signed)
Pt assisted to transfer to hospital, one person assistance.

## 2019-09-01 NOTE — ED Notes (Signed)
Per admitting provider MD Reesa Chew, pt to remain NPO until arriving on inpatient unit.

## 2019-09-01 NOTE — ED Notes (Signed)
Admitting provider MD Reesa Chew messaged regarding pts elevated HR.

## 2019-09-01 NOTE — Progress Notes (Signed)
PROGRESS NOTE    [name].  ZJQ:[id number] DOB: [birth year] DOA: 08/31/2019 PCP: [name], MD   Brief Narrative:  85 year old with history of CAD status post CABG, paroxysmal A. fib, COPD, sick sinus syndrome status post pacemaker, CHF, HTN, HLD, obstructive sleep apnea on CPAP, PVD, prostate cancer status post prostatectomy, esophageal cancer currently on home hospice, dysphagia with PEG tube, hoarseness from vocal cord paralysis admitted for rectal bleeding and altered mental status.  Initially noted to be quite anemic with positive FOBT and bright red blood per rectum.  1 unit PRBC transfused, given IV fluids, PEG tube reinserted.  GI team consulted.   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  Hemoglobin is stable for now.  G-tube replaced Seen by gastroenterology, plans for clear liquid diet per feeding tube.  Monitor for any more signs of bleeding.  Holding off on endoscopic evaluation. GI team is following.  Endoscopy back in January 2020 showed normal esophagus with a CT scan on 07/19/2019 showed progressive enlargement of necrotic mass associated with upper esophagus with invasion into posterior trachea, nodules in the left upper lobe, cirrhosis, renal stones.  Altered mental status:  Resolved mentation back to baseline  AKI:  Likely prerenal in nature, admission creatinine 1.49, trended down to 1.1.  Baseline is around 1.0.  Esophageal squamous cell carcinoma Follows with Dr. Benay Spice.  Currently on home hospice program.  I have added him to the treatment team and sent a message.  Pancytopenia Likely from bone marrow  suppression.  We will continue to monitor.  If necessary we can give him platelet transfusions for any further bleeding  Chronic systolic CHF: LVEF 45 to 24% on last echo done October 2020.   Chronically on 2 L nasal cannula at home.  Continue to provide supportive care.  OSA Chronic hypoxia on 2 L nasal cannula -Continue CPAP at night  QT prolongation on EKG -Cardiac monitoring.  Monitor electrolytes, replete as necessary.  We need to establish clear goals of care as there is question of endoscopic evaluation versus conservative management.  Appreciate input from GI team.  I will consult palliative care team.  DVT prophylaxis: SCDs Start: 08/31/19 2345  Code Status: DNR Family Communication:  Called Daughter in Sports coach. Left a voicemail.   Status is: Inpatient  Remains inpatient appropriate because:Inpatient level of care appropriate due to severity of illness   Dispo: The patient is from: Home              Anticipated d/c is to: Home              Anticipated d/c date is: 1 day              Patient currently is not medically stable to d/c.  Need to monitor patient for any further signs of bleeding, maintain hospital stay.   Subjective: Patient seen and examined at bedside, does not any complaints feeling well.  Tells me he was having some bright red blood per rectum mixed with dark stools.  He thinks he is getting endoscopic evaluation, there is some confusion in terms of his goals of care.  Overall he does  not have any complaints.  Review of Systems Otherwise negative except as per HPI, including: General: Denies fever, chills, night sweats or unintended weight loss. Resp: Denies cough, wheezing, shortness of breath. Cardiac: Denies chest pain, palpitations, orthopnea, paroxysmal nocturnal dyspnea. GI: Denies abdominal pain, nausea, vomiting, diarrhea or constipation GU: Denies dysuria, frequency, hesitancy or incontinence MS: Denies muscle aches, joint pain or  swelling Neuro: Denies headache, neurologic deficits (focal weakness, numbness, tingling), abnormal gait Psych: Denies anxiety, depression, SI/HI/AVH Skin: Denies new rashes or lesions ID: Denies sick contacts, exotic exposures, travel  Examination:  General exam: Hoarse voice, 2 L nasal cannula, elderly frail. Respiratory system: Bilateral minimal rhonchi Cardiovascular system: Sinus tachycardia heart rate 105. Gastrointestinal system: PEG tube noted, abdomen is nontender nondistended. Central nervous system: Alert and oriented. No focal neurological deficits. Extremities: Symmetric 5 x 5 power. Skin: No rashes, lesions or ulcers Psychiatry: Alert awake oriented, poor judgment and insight  Objective: Vitals:   09/01/19 0600 09/01/19 0630 09/01/19 0710 09/01/19 0900  BP: 110/65 107/66 102/73 109/74  Pulse: (!) 103 83 (!) 105 (!) 110  Resp: 15 14 18 18   Temp:   97.6 F (36.4 C)   TempSrc:   Oral   SpO2: 95% 97% 96% 94%    Intake/Output Summary (Last 24 hours) at 09/01/2019 1216 Last data filed at 09/01/2019 0539 Gross per 24 hour  Intake 2974.33 ml  Output --  Net 2974.33 ml   There were no vitals filed for this visit.   Data Reviewed:   CBC: Recent Labs  Lab 08/26/19 1335 08/31/19 2138 08/31/19 2347 09/01/19 0535 09/01/19 0810  WBC 3.8* 3.7* 2.7* 2.8* 3.2*  NEUTROABS 3.1 3.0  --   --   --   HGB 11.0* 8.3* 7.0* 9.4* 9.5*  HCT 33.3* 26.5* 22.4* 29.0* 29.6*  MCV 91.0 98.5 97.4 93.5 93.7  PLT 34* 31* 28* 29* 28*   Basic Metabolic Panel: Recent Labs  Lab 08/26/19 1335 08/31/19 2138 08/31/19 2347 09/01/19 0535  NA 132* 141  --  141  K 4.4 4.1  --  4.1  CL 98 107  --  109  CO2 27 22  --  26  GLUCOSE 57* 174*  --  123*  BUN 23 54*  --  47*  CREATININE 0.96 1.49*  --  1.10  CALCIUM 8.1* 7.5*  --  7.0*  MG  --   --  2.4  --    GFR: Estimated Creatinine Clearance: 47.6 mL/min (by C-G formula based on SCr of 1.1 mg/dL). Liver Function Tests: Recent Labs   Lab 08/26/19 1335 08/31/19 2138  AST 28 29  ALT 32 24  ALKPHOS 273* 177*  BILITOT 2.0* 2.0*  PROT 5.8* 4.9*  ALBUMIN 2.1* 1.8*   No results for input(s): LIPASE, AMYLASE in the last 168 hours. No results for input(s): AMMONIA in the last 168 hours. Coagulation Profile: No results for input(s): INR, PROTIME in the last 168 hours. Cardiac Enzymes: No results for input(s): CKTOTAL, CKMB, CKMBINDEX, TROPONINI in the last 168 hours. BNP (last 3 results) No results for input(s): PROBNP in the last 8760 hours. HbA1C: No results for input(s): HGBA1C in the last 72 hours. CBG: No results for input(s): GLUCAP in the last 168 hours. Lipid Profile: No results for input(s): CHOL, HDL, LDLCALC, TRIG, CHOLHDL, LDLDIRECT in the last 72 hours. Thyroid Function Tests: No results for input(s): TSH, T4TOTAL, FREET4, T3FREE, THYROIDAB in the last 72 hours. Anemia Panel: No results for input(s): VITAMINB12, FOLATE,  FERRITIN, TIBC, IRON, RETICCTPCT in the last 72 hours. Sepsis Labs: Recent Labs  Lab 09/01/19 0535  PROCALCITON 0.57    Recent Results (from the past 240 hour(s))  SARS Coronavirus 2 by RT PCR (hospital order, performed in [facility] hospital lab) Nasopharyngeal Nasopharyngeal Swab     Status: None   Collection Time: 08/31/19 10:07 PM   Specimen: Nasopharyngeal Swab  Result Value Ref Range Status   SARS Coronavirus 2 NEGATIVE NEGATIVE Final    Comment: (NOTE) SARS-CoV-2 target nucleic acids are NOT DETECTED.  The SARS-CoV-2 RNA is generally detectable in upper and lower respiratory specimens during the acute phase of infection. The lowest concentration of SARS-CoV-2 viral copies this assay can detect is 250 copies / mL. A negative result does not preclude SARS-CoV-2 infection and should not be used as the sole basis for treatment or other patient management decisions.  A negative result may occur with improper specimen collection / handling, submission of specimen  other than nasopharyngeal swab, presence of viral mutation(s) within the areas targeted by this assay, and inadequate number of viral copies (<250 copies / mL). A negative result must be combined with clinical observations, patient history, and epidemiological information.  Fact Sheet for Patients:   StrictlyIdeas.no  Fact Sheet for Healthcare Providers: BankingDealers.co.za  This test is not yet approved or  cleared by the [place] FDA and has been authorized for detection and/or diagnosis of SARS-CoV-2 by FDA under an Emergency Use Authorization (EUA).  This EUA will remain in effect (meaning this test can be used) for the duration of the COVID-19 declaration under Section 564(b)(1) of the Act, 21 U.S.C. section 360bbb-3(b)(1), unless the authorization is terminated or revoked sooner.  Performed at [facility], [place] [address], [place], [place] [address]          Radiology Studies: DG Abd 1 View  Result Date: 09/01/2019 CLINICAL DATA:  GI bleed, abdominal pain EXAM: ABDOMEN - 1 VIEW COMPARISON:  08/31/2019 FINDINGS: Gastrostomy tube projects over the stomach. Nonobstructive bowel gas pattern. No organomegaly or free air. IMPRESSION: No acute findings. Electronically Signed   By: Rolm Baptise M.D.   On: 09/01/2019 00:25   DG Abdomen 1 View  Result Date: 08/31/2019 CLINICAL DATA:  Gastrostomy catheter placement EXAM: ABDOMEN - 1 VIEW COMPARISON:  Jul 26, 2019 FINDINGS: Gastrostomy catheter is positioned within the stomach. Contrast closed into the stomach without extravasation. Contrast flows from the stomach into the duodenum. No bowel dilatation or air-fluid levels to suggest bowel obstruction. No free air. Bibasilar atelectasis noted. IMPRESSION: Gastrostomy catheter positioned in the stomach. Contrast flows into the stomach without extravasation. Contrast flows freely from stomach into duodenum. No bowel  obstruction or free air. Electronically Signed   By: Lowella Grip III M.D.   On: 08/31/2019 11:07   DG CHEST PORT 1 VIEW  Result Date: 09/01/2019 CLINICAL DATA:  Abnormal lung sounds EXAM: PORTABLE CHEST 1 VIEW COMPARISON:  07/23/2019 FINDINGS: Right pacer and left Port-A-Cath remain in place, unchanged. Increased seen bibasilar atelectasis or infiltrates. Heart is normal size. Prior CABG. No effusions or acute bony abnormality. IMPRESSION: Increasing bibasilar opacities, atelectasis versus pneumonia. Electronically Signed   By: Rolm Baptise M.D.   On: 09/01/2019 02:25        Scheduled Meds: Continuous Infusions:   LOS: 1 day   Time spent= 35 mins    Vanesa Renier Arsenio Loader, MD [facility]  If 7PM-7AM, please contact night-coverage  09/01/2019, 12:16 PM

## 2019-09-01 NOTE — Consult Note (Signed)
Referring Provider: Dr. Gerlean Ren  Primary Care Physician:  [name], MD Primary Gastroenterologist:  Dr.Henry Danis   Reason for Consultation:  Rectal bleeding   HPI: Curtis Kahl. is a 85 y.o. male with a past medical history significant for hypertension, hyperlipidemia, coronary artery disease, MI, s/p PTCA 1994, s/p CABG x 5 vessels 1997, paroxysmal atrial fibrillation, sick sinus syndrome s/p pacemaker 1999 and 2006, CHF, AAA, AS, COPD, OSA on CPAP, prostate cancer status post prostatectomy 2010, questionable cirrhosis per CT imaging and terminal esophageal squamous cell carcinoma, dysphagia s/p PEG placement and left vocal cord paralysis under hospice care. He was initially diagnosed with esophageal squamous cell carcinoma at the time of an EGD done by Dr. Loletha Carrow 10/30/2017. He received radiation weekly Taxol/Carboplatin from late August to early October 2019. He developed reflux and dysphagia during his chemotherapy and radiation treatment. A repeat EGD was done 03/29/2018 which was normal, no visible tumor. No further surveillance EGDs were done due to his  multiple comorbidities including vocal cord paralysis with increased risk for sedation and procedure related complications.  He was to stay on PPI indefinitely to prevent reflux esophagitis and stricture formation. He was last seen in our office by Dr. Loletha Carrow 05/20/2019 after having recurrent dysphagia with recurrence of his esophageal cancer confirmed by chest CT. He received additional chemotherapy. No further endoscopic evaluation was recommended at that time due to his advanced malignancy. Screening colonoscopy was deferred.  He was admitted to the hospital 07/19/2019 - 5/12/221 with hemoptysis and persistent dysphagia. He required PRBC transfusion  X 3 for Hg 7.7 during his hospitalization. He underwent placement of a gastrostomy feeding tube on 07/26/2019.  He is completed palliative radiation to the esophagus mass. He continues  follow up with oncologist,  Dr. Benay Spice.   He presented to [facility] emergency room by EMS on 08/31/2019 after his  G-tube was dislodged. A 22 French G tube was replaced  and a follow-up abdominal x-ray showed proper placement and he was discharged. Later in the evening, went to the bathroom and passed a large amount of blood per the rectum and passed out. His family called 62 and he was transported to [facility] ER by ambulance for further evaluation.  He is a DNR. In the ED, he was alert but lethargic. WBC 3.7. Hg 8.3 (baseline Hg 11.0 on 6/11)  then dropped to 7.0. PLT 31.  He received one unit of PRBCs. Post transfusion Hg 9.4. No further hematochezia since arriving to the ED. He tells me he passed a large amount of black stool with red and dark red blood yesterday evening. No associated abdominal pain or rectal pain. I called his daughter, Danton Clap, and she reported seeing a large amount of bright red blood with darker blood clots in the toilet bowl, no melena, no stool. He previously was passing brown soft to loose stools most days. He had at least one colonoscopy more than 10 years ago,  further details unknown. Father with history of colon cancer. No NSAID use. No alcohol use for 22 years. No current chest pain or SOB. No family at the bed side at this time.   ED course: Sodium 141 potassium 4.1.  Glucose 171.  BUN 20.  Creatinine 1.9.  Calcium 7.5.  70s.  AST 29.  ALT 25.  Total bili 0.  WBC 26.5.  MCV 98.5.  Platelet 31.  [place]us [place]n Coronavirus 2 negative   Abdominal x-ray 08/31/2019: Gastrostomy catheter positioned in the stomach. Contrast  flows into the stomach without extravasation. Contrast flows freely from stomach into duodenum. No bowel obstruction or free air.  Chest CT 07/19/2019: 1. Progressive enlargement of the necrotic mass associated with the upper esophagus, now measuring 4.5 x 3.1 x 5.9 cm, previously 3.3 x 2.6 x 4.1 cm, with new invasion of the posterior wall of the trachea. 2.  Previously seen 10 mm nodular density in the left upper lobe has decreased in size, now measuring 6 mm, consistent with resolving infection or inflammation. 3. Unchanged cirrhosis. 4. Unchanged bilateral nonobstructive nephrolithiasis. 5. Aortic Atherosclerosis (ICD10-I70.0) and Emphysema (ICD10-J43.9).  ECHO 12/16/2018: 1. Left ventricular ejection fraction, by visual estimation, is 45 to 50%. The left ventricle has mildly decreased function. Mildly increased left ventricular size. Left ventricular septal wall thickness was normal. There is no left ventricular hypertrophy. 2. Right ventricular volume/pressure overload. 3. Septal and apical hypokinesis with abnormal septal motion from pacing. 4. Global right ventricle has normal systolic function.The right ventricular size is normal. No increase in right ventricular wall thickness. 5. Left atrial size was normal. 6. Right atrial size was normal. 7. Moderate calcification of the mitral valve leaflet(s). 8. Moderate thickening of the mitral valve leaflet(s). 9. The mitral valve is degenerative. Mild mitral valve regurgitation. 10. The tricuspid valve is normal in structure. Tricuspid valve regurgitation is mild. 11. Aortic valve regurgitation is trivial by color flow Doppler. 12. Post TAVR 29 mm Sapien 3 gradients lower than those seen Juney 2019 with trivial PVL. 13. The pulmonic valve was grossly normal. Pulmonic valve regurgitation is mild by color flow Doppler. 14. Moderately elevated pulmonary artery systolic pressure.  EGD 03/29/2018: - Esophageal mucosal variant. - Normal stomach. - Normal examined duodenum. - No specimens collected. Excellent response to treatment.  EGD 10/30/2017 due to having melena on antiplatelet therapy by Dr. Loletha Carrow: - Partially obstructing, likely malignant esophageal tumor was found in the middle third of the esophagus. Biopsied. - Normal stomach. - Normal examined duodenum Biopsy  Report: Esophagus, biopsy, mass - SQUAMOUS CELL CARCINOMA   Past Medical History:  Diagnosis Date  . AAA (abdominal aortic aneurysm) ([place])    a. Last duplex - 3.8cm 01/2013 - due 01/2014.  [name] Acne rosacea   . Alcoholic hepatitis with ascites 08/28/2017  . Anginal pain ([place]) 1996  . Aortic stenosis, mild    Last echo 05/04/12 +LVH  . Arthritis    "shoulders" (09/01/2017)  . Back pain    hx epidural injections  . Baker's cyst    Lt.  [name] CAD (coronary artery disease)    a. s/p MI/PTCA - Cx 1994. b. s/p CABG x5 in 1997. c. Abnl nuc 2003- occ SVG-RCA, patent seq SVG-OM1-dCxOM, patentl LIMA-LAD, patent, SVG-diag. d. Normal nuc 09/2012.  . Carotid artery disease ([place])    a. Duplex 01/2013: mildly abnormal. Mild plaque without significant diameter reduction. Repeat recommended 11/15.  [name] COPD (chronic obstructive pulmonary disease) ([place])   . Dizziness and giddiness 08/04/2013  . GERD (gastroesophageal reflux disease)   . H/O: GI bleed    mild, neg. colonoscopy  . Heart murmur   . HOH (hard of hearing)   . HTN (hypertension)   . Hyperlipidemia   . Hypertensive heart disease   . Myocardial infarction ([place]) 1994  . OSA on CPAP   . OSA treated with BiPAP 11/20/2015   Severe with [facility] 38/hr  . PAF (paroxysmal atrial fibrillation) ([place])    a. Remote per records.   . Pneumonia    history  .  Presence of permanent cardiac pacemaker    DOI 1999 with change out 2006; St Jude  . Prostate cancer ([place]) ~ 2010   a. s/p radical retropubic prostatectomy with bilateral pelvic lymph node dissection  . PVD (peripheral vascular disease) ([place])    a. Rt ext iliac stenosis, last PV cath 2003, moderate stenosis last Lower Ext Dopplers 12/16/11 - Rt. ABI 0.96  Lt ABI 1.0.  . Sick sinus syndrome ([place])    a. placed 1999, gen change 2011 - St. Jude.  . Sinus node dysfunction ([place]) 01/20/2013  . Syncope 11/28/97  . Thrombocytopenia ([facility])    chronic, mild  . Valvular heart disease    a. Echo 04/2012: mild-mod AS,  mild AI, mild MR.    Past Surgical History:  Procedure Laterality Date  . APPENDECTOMY    . BIOPSY  10/20/2017   Procedure: BIOPSY;  Surgeon: Doran Stabler, MD;  Location: [facility];  Service: Gastroenterology;;  . CARDIAC CATHETERIZATION  2003   VG to RCA occluded, other grafts patent  . CARDIAC CATHETERIZATION N/A 05/04/2015   Procedure: Right/Left Heart Cath and Coronary/Graft Angiography;  Surgeon: Sherren Mocha, MD;  Location: [place] [facility];  Service: Cardiovascular;  Laterality: N/A;  . CARDIAC CATHETERIZATION  09/01/2017  . CARDIAC VALVE REPLACEMENT    . CATARACT EXTRACTION W/ INTRAOCULAR LENS  IMPLANT, BILATERAL Bilateral   . COLONOSCOPY    . CORONARY ANGIOPLASTY  03/29/92   PTCA to LCX  . CORONARY ARTERY BYPASS GRAFT  1997   LIMA-LAD; VG-diag; seq VG-1st OM & distal LCX; VG-RCA  . CORONARY ATHERECTOMY N/A 09/01/2017   Procedure: CORONARY ATHERECTOMY;  Surgeon: Jettie Booze, MD;  Location: [place] [facility];  Service: Cardiovascular;  Laterality: N/A;  . CORONARY ATHERECTOMY  10/01/2017  . CORONARY ATHERECTOMY N/A 10/01/2017   Procedure: CORONARY ATHERECTOMY;  Surgeon: Leonie Man, MD;  Location: [place] [facility];  Service: Cardiovascular;  Laterality: N/A;  . ESOPHAGOGASTRODUODENOSCOPY (EGD) WITH PROPOFOL N/A 10/20/2017   Procedure: ESOPHAGOGASTRODUODENOSCOPY (EGD) WITH PROPOFOL;  Surgeon: Doran Stabler, MD;  [facility]: [facility];  Service: Gastroenterology;  Laterality: N/A;  . ESOPHAGOGASTRODUODENOSCOPY (EGD) WITH PROPOFOL N/A 03/29/2018   Procedure: ESOPHAGOGASTRODUODENOSCOPY (EGD) WITH PROPOFOL;  Surgeon: Doran Stabler, MD;  [facility]: [facility];  Service: Gastroenterology;  Laterality: N/A;  . INSERT / REPLACE / REMOVE PACEMAKER  11/28/1997   pacesetter--ERI 2011  . IR GASTROSTOMY TUBE MOD SED  07/26/2019  . IR IMAGING GUIDED PORT INSERTION  02/02/2019  . LEFT HEART CATH N/A 09/01/2017   Procedure: Left Heart Cath;  Surgeon: Jettie Booze, MD;  Location: [place] [facility];  Service: Cardiovascular;  Laterality: N/A;  . LEFT HEART CATH AND CORS/GRAFTS ANGIOGRAPHY N/A 08/28/2017   Procedure: LEFT HEART CATH AND CORS/GRAFTS ANGIOGRAPHY;  Surgeon: Leonie Man, MD;  Location: [place] [facility];  Service: Cardiovascular;  Laterality: N/A;  . PACEMAKER GENERATOR CHANGE  08/15/2009   St. Jude accent  . SUPRAPUBIC PROSTATECTOMY    . TEE WITHOUT CARDIOVERSION N/A 06/12/2015   Procedure: TRANSESOPHAGEAL ECHOCARDIOGRAM (TEE);  Surgeon: Sherren Mocha, MD;  Location: [place];  Service: Open Heart Surgery;  Laterality: N/A;  . TONSILLECTOMY    . TRANSCATHETER AORTIC VALVE REPLACEMENT, TRANSFEMORAL N/A 06/12/2015   Procedure: TRANSCATHETER AORTIC VALVE REPLACEMENT, TRANSFEMORAL, possible transpical;  Surgeon: Sherren Mocha, MD;  Location: [place];  Service: Open Heart Surgery;  Laterality: N/A;    Prior to Admission medications   Medication Sig  Start Date End Date Taking? Authorizing Provider  acetaminophen (TYLENOL) 325 MG tablet Place 2 tablets (650 mg total) into feeding tube 2 (two) times daily. 07/27/19  Yes Kyle, Tyrone A, DO  amoxicillin (AMOXIL) 250 MG/5ML suspension Take 500 mg by mouth 2 (two) times daily. 08/29/19  Yes [provider]  famotidine (PEPCID) 20 MG tablet Place 20 mg into feeding tube 2 (two) times daily.  08/17/19  Yes [provider]  haloperidol (HALDOL) 1 MG tablet Place 1 mg into feeding tube daily.  08/17/19  Yes [provider]  HYDROcodone-acetaminophen (HYCET) 7.5-325 mg/15 ml solution Place 15 mLs into feeding tube every 6 (six) hours as needed for moderate pain or severe pain. 08/26/19  Yes Owens Shark, NP  lidocaine-prilocaine (EMLA) cream Apply to port site one hour prior to use 01/20/19  Yes Owens Shark, NP  LORazepam (ATIVAN) 0.5 MG tablet Place 0.5 mg into feeding tube in the morning, at noon, in the evening, and at bedtime.    Yes [provider]   metoCLOPramide (REGLAN) 10 MG/10ML SOLN Place 10 mLs into feeding tube in the morning, at noon, in the evening, and at bedtime.  08/17/19  Yes [provider]  nitroGLYCERIN (NITROSTAT) 0.4 MG SL tablet PLACE 1 TABLET UNDER THE TONGUE EVERY 5 MINUTES AS NEEDED FOR CHEST PAIN UP TO 3 DOSES, IF SYMPTOMS PERSIST CALL 911 Patient taking differently: Place 0.4 mg under the tongue every 5 (five) minutes as needed for chest pain.  01/05/19  Yes Lyda Jester M, PA-C  NON FORMULARY Apply 1 application topically See admin instructions. Tridema pain cream: Rub into both shoulders 2 times a day   Yes [provider]  Nutritional Supplements (FEEDING SUPPLEMENT, JEVITY 1.2 CAL,) LIQD Place 237 mLs into feeding tube 5 (five) times daily. With water flush 60 ml before/after   Yes [provider]  omeprazole (PRILOSEC) 20 MG capsule 20 mg in the morning and at bedtime. tube   Yes [provider]  ondansetron (ZOFRAN) 4 MG tablet Take 4 mg by mouth every 8 (eight) hours as needed for nausea.  08/25/19  Yes [provider]  prochlorperazine (COMPAZINE) 5 MG tablet Place 1 tablet (5 mg total) into feeding tube every 6 (six) hours as needed for nausea or vomiting. 07/27/19  Yes Marylyn Ishihara, Tyrone A, DO  fluconazole (DIFLUCAN) 10 MG/ML suspension Place 10 mLs (100 mg total) into feeding tube daily. Take x 5 days per G-tube Patient not taking: Reported on 08/31/2019 08/05/19   Ladell Pier, MD  metoprolol tartrate (LOPRESSOR) 25 MG tablet Place 1 tablet (25 mg total) into feeding tube 2 (two) times daily. Patient not taking: Reported on 08/31/2019 07/27/19   Cherylann Ratel A, DO    Current Facility-Administered Medications  Medication Dose Route Frequency Provider Last Rate Last Admin  . 0.9 %  sodium chloride infusion   Intravenous Continuous Shela Leff, MD 125 mL/hr at 09/01/19 0206 New Bag at 09/01/19 0206   Current Outpatient Medications  Medication Sig Dispense  Refill  . acetaminophen (TYLENOL) 325 MG tablet Place 2 tablets (650 mg total) into feeding tube 2 (two) times daily.    [name] amoxicillin (AMOXIL) 250 MG/5ML suspension Take 500 mg by mouth 2 (two) times daily.    . famotidine (PEPCID) 20 MG tablet Place 20 mg into feeding tube 2 (two) times daily.     . haloperidol (HALDOL) 1 MG tablet Place 1 mg into feeding tube daily.     [name]  HYDROcodone-acetaminophen (HYCET) 7.5-325 mg/15 ml solution Place 15 mLs into feeding tube every 6 (six) hours as needed for moderate pain or severe pain. 420 mL 0  . lidocaine-prilocaine (EMLA) cream Apply to port site one hour prior to use 30 g 2  . LORazepam (ATIVAN) 0.5 MG tablet Place 0.5 mg into feeding tube in the morning, at noon, in the evening, and at bedtime.     . metoCLOPramide (REGLAN) 10 MG/10ML SOLN Place 10 mLs into feeding tube in the morning, at noon, in the evening, and at bedtime.     . nitroGLYCERIN (NITROSTAT) 0.4 MG SL tablet PLACE 1 TABLET UNDER THE TONGUE EVERY 5 MINUTES AS NEEDED FOR CHEST PAIN UP TO 3 DOSES, IF SYMPTOMS PERSIST CALL 911 (Patient taking differently: Place 0.4 mg under the tongue every 5 (five) minutes as needed for chest pain. ) 25 tablet 2  . NON FORMULARY Apply 1 application topically See admin instructions. Tridema pain cream: Rub into both shoulders 2 times a day    . Nutritional Supplements (FEEDING SUPPLEMENT, JEVITY 1.2 CAL,) LIQD Place 237 mLs into feeding tube 5 (five) times daily. With water flush 60 ml before/after    . omeprazole (PRILOSEC) 20 MG capsule 20 mg in the morning and at bedtime. tube    . ondansetron (ZOFRAN) 4 MG tablet Take 4 mg by mouth every 8 (eight) hours as needed for nausea.     . prochlorperazine (COMPAZINE) 5 MG tablet Place 1 tablet (5 mg total) into feeding tube every 6 (six) hours as needed for nausea or vomiting. 30 tablet 2  . fluconazole (DIFLUCAN) 10 MG/ML suspension Place 10 mLs (100 mg total) into feeding tube daily. Take x 5 days per G-tube  (Patient not taking: Reported on 08/31/2019) 50 mL 0  . metoprolol tartrate (LOPRESSOR) 25 MG tablet Place 1 tablet (25 mg total) into feeding tube 2 (two) times daily. (Patient not taking: Reported on 08/31/2019) 180 tablet 0    Allergies as of 08/31/2019  . (No Known Allergies)    Family History  Problem Relation Age of Onset  . Colon cancer Father   . Cancer Brother   . Heart Problems Brother   . Heart Problems Sister   . CAD Other   . Stomach cancer Neg Hx   . Pancreatic cancer Neg Hx   . Esophageal cancer Neg Hx     Social History   Socioeconomic History  . Marital status: Widowed    Spouse name: Not on file  . Number of children: 5  . Years of education: 7th  . Highest education level: Not on file  Occupational History  . Occupation: Retired  Tobacco Use  . Smoking status: Former Smoker    Packs/day: 2.00    Years: 48.00    Pack years: 96.00    Types: Cigarettes    Quit date: 03/18/1995    Years since quitting: 24.4  . Smokeless tobacco: Never Used  Vaping Use  . Vaping Use: Never used  Substance and Sexual Activity  . Alcohol use: No    Comment: 09/01/2017 "nothing in the last couple years"  . Drug use: Never  . Sexual activity: Not Currently  Other Topics Concern  . Not on file  Social History Narrative  . Not on file   Social Determinants of Health   Financial Resource Strain:   . Difficulty of Paying Living Expenses:   Food Insecurity:   . Worried About Charity fundraiser in the Last  Year:   . Ran Out of Food in the Last Year:   Transportation Needs:   . Film/video editor (Medical):   [name] Lack of Transportation (Non-Medical):   Physical Activity:   . Days of Exercise per Week:   . Minutes of Exercise per Session:   Stress:   . Feeling of Stress :   Social Connections:   . Frequency of Communication with Friends and Family:   . Frequency of Social Gatherings with Friends and Family:   . Attends Religious Services:   . Active Member of  Clubs or Organizations:   . Attends Archivist Meetings:   [name] Marital Status:   Intimate Partner Violence:   . Fear of Current or Ex-Partner:   . Emotionally Abused:   [name] Physically Abused:   . Sexually Abused:     Review of Systems:  See HPI, all other systems reviewed and are negative    Physical Exam: Vital signs in last 24 hours: Temp:  [97.4 F (36.3 C)-98.3 F (36.8 C)] 97.5 F (36.4 C) (06/17 0158) Pulse Rate:  [83-120] 83 (06/17 0630) Resp:  [0-28] 14 (06/17 0630) BP: (79-117)/(47-79) 107/66 (06/17 0630) SpO2:  [87 %-100 %] 97 % (06/17 0630)   General:  Alert fatigued appearing 85 year old male with voice hoarseness in NAD. Head:  Normocephalic and atraumatic. Eyes:  No scleral icterus. Conjunctiva pink. Ears:  Normal auditory acuity. Nose:  No deformity, discharge or lesions. Mouth:  Absent dentition. Dry tongue with light yellow coating. No ulcers or lesions.  Neck:  Supple. No lymphadenopathy or thyromegaly.  Lungs:  Coarse insp/expiratory breath sounds throughout. Congested cough.  Heart:  RRR, no murmur.  Abdomen:  Soft, nondistended. Nontender. LUQ peg tube clamped, surrounding site intact with betadine on drsg. + BS x 4 quads. Rectal: No external hemorrhoids or fissures. Small amount of bright red blood in the rectum, no stool. Internal hemorrhoids without prolapse. No mass. Prostate is enlarged.  Musculoskeletal:  Symmetrical without gross deformities.  Pulses:  Normal pulses noted. Extremities:  Left ankle edema.  Neurologic:  Alert and  oriented x4. No focal deficits. Voice hoarseness.  Skin:  Intact without significant lesions or rashes. Psych:  Alert and cooperative. Normal mood and affect.  Intake/Output from previous day: 06/16 0701 - 06/17 0700 In: 2974.3 [I.V.:29.3; Blood:945; IV [id number]:4098] Out: -  Intake/Output this shift: No intake/output data recorded.  Lab Results: Recent Labs    08/31/19 2138 08/31/19 2347 09/01/19 0535   WBC 3.7* 2.7* 2.8*  HGB 8.3* 7.0* 9.4*  HCT 26.5* 22.4* 29.0*  PLT 31* 28* 29*   BMET Recent Labs    08/31/19 2138 09/01/19 0535  NA 141 141  K 4.1 4.1  CL 107 109  CO2 22 26  GLUCOSE 174* 123*  BUN 54* 47*  CREATININE 1.49* 1.10  CALCIUM 7.5* 7.0*   LFT Recent Labs    08/31/19 2138  PROT 4.9*  ALBUMIN 1.8*  AST 29  ALT 24  ALKPHOS 177*  BILITOT 2.0*   PT/INR No results for input(s): LABPROT, INR in the last 72 hours. Hepatitis Panel No results for input(s): HEPBSAG, HCVAB, HEPAIGM, HEPBIGM in the last 72 hours.    Studies/Results: DG Abd 1 View  Result Date: 09/01/2019 CLINICAL DATA:  GI bleed, abdominal pain EXAM: ABDOMEN - 1 VIEW COMPARISON:  08/31/2019 FINDINGS: Gastrostomy tube projects over the stomach. Nonobstructive bowel gas pattern. No organomegaly or free air. IMPRESSION: No acute findings. Electronically Signed  By: Rolm Baptise M.D.   On: 09/01/2019 00:25   DG Abdomen 1 View  Result Date: 08/31/2019 CLINICAL DATA:  Gastrostomy catheter placement EXAM: ABDOMEN - 1 VIEW COMPARISON:  Jul 26, 2019 FINDINGS: Gastrostomy catheter is positioned within the stomach. Contrast closed into the stomach without extravasation. Contrast flows from the stomach into the duodenum. No bowel dilatation or air-fluid levels to suggest bowel obstruction. No free air. Bibasilar atelectasis noted. IMPRESSION: Gastrostomy catheter positioned in the stomach. Contrast flows into the stomach without extravasation. Contrast flows freely from stomach into duodenum. No bowel obstruction or free air. Electronically Signed   By: Lowella Grip III M.D.   On: 08/31/2019 11:07   DG CHEST PORT 1 VIEW  Result Date: 09/01/2019 CLINICAL DATA:  Abnormal lung sounds EXAM: PORTABLE CHEST 1 VIEW COMPARISON:  07/23/2019 FINDINGS: Right pacer and left Port-A-Cath remain in place, unchanged. Increased seen bibasilar atelectasis or infiltrates. Heart is normal size. Prior CABG. No effusions or  acute bony abnormality. IMPRESSION: Increasing bibasilar opacities, atelectasis versus pneumonia. Electronically Signed   By: Rolm Baptise M.D.   On: 09/01/2019 02:25    IMPRESSION/PLAN:  74. 85 year old male with a complex medical history including terminal squamous cell cancer with recurrence, dysphagia, vocal cord paralysis with a PEG tube admitted to the hospital with painless hematochezia and acute on chronic anemia.  Pt is a DNR. Admission Hg 8.3 -> 7.0. Transfused 1 unit of PRBCs. Post transfusion Hg 9.4.  -Monitory H/H closely -Transfuse for Hg < 8.  -No plans for endoscopic evaluation at this time, further recommendations per Dr. Bryan Lemma  -NPO for now -PPI IV BID  2. Pancytopenia secondary to ? cirrhosis and chemotherapy. WBC 2.8. RBC 3.10. Hg 9.4. PLT 29.  3. AKI improving, Cr. 1.10  4. CHF  5. CAD, S/P CABG  6. Atrial fibrillation not on anticoagulation   7. OSA on cpap    [name]  09/01/2019, 7:50 AM

## 2019-09-01 NOTE — Progress Notes (Signed)
Pt declined nocturnal cpap tonight.  Pt stated he does not wear it at home.  Pt was encouraged to call should he change his mind.  RN aware.

## 2019-09-01 NOTE — Progress Notes (Addendum)
[facility]  Pt is currently enrolled in Hospice services under our home care program. He is admitted to [place] under his diagnosis of esophageal cancer.   Goals are conservative treatment at this time according to the pt  but going forward pt would want further workup if he has another GI bleed in future. He reports having some pain at PEG site from it being manipulated yesterday. Also  reports he feels good now and is hopeful to return home today. Unable to reach any of his children this visit they are not at bedside.  I have left voice mails to return call to clarify goals and wishes.   Pt was getting Jevity 1.5 cal via feeding tube at 40 ml/hour at home by pump. Orders were to increase by 6ml daily until goal of 50ml/hr.   However pt has not been able to tolerate this increase. Once turned to 50 ml he was then decreased to 74ml because of intolerance.   He is also getting pantoprazole, zofran and reglan regularly to help with tolerance.   Gave RN in ED copy of plan of care with medication list for review or you can call with questions.   Webb Silversmith RN [phone]

## 2019-09-02 ENCOUNTER — Other Ambulatory Visit: Payer: Self-pay

## 2019-09-02 DIAGNOSIS — Z931 Gastrostomy status: Secondary | ICD-10-CM

## 2019-09-02 LAB — BASIC METABOLIC PANEL
Anion gap: 6 (ref 5–15)
BUN: 41 mg/dL — ABNORMAL HIGH (ref 8–23)
CO2: 25 mmol/L (ref 22–32)
Calcium: 7.6 mg/dL — ABNORMAL LOW (ref 8.9–10.3)
Chloride: 112 mmol/L — ABNORMAL HIGH (ref 98–111)
Creatinine, Ser: 1.06 mg/dL (ref 0.61–1.24)
GFR calc Af Amer: >60 mL/min (ref >60)
GFR calc non Af Amer: >60 mL/min (ref >60)
Glucose, Bld: 85 mg/dL (ref 70–99)
Potassium: 4.2 mmol/L (ref 3.5–5.1)
Sodium: 143 mmol/L (ref 135–145)

## 2019-09-02 LAB — TYPE AND SCREEN
ABO/RH(D): A POS
Antibody Screen: NEGATIVE
Unit division: 0

## 2019-09-02 LAB — CBC
HCT: 29.6 % — ABNORMAL LOW (ref 39.0–52.0)
Hemoglobin: 9.9 g/dL — ABNORMAL LOW (ref 13.0–17.0)
MCH: 32.1 pg (ref 26.0–34.0)
MCHC: 33.4 g/dL (ref 30.0–36.0)
MCV: 96.1 fL (ref 80.0–100.0)
Platelets: 39 10*3/uL — ABNORMAL LOW (ref 150–400)
RBC: 3.08 MIL/uL — ABNORMAL LOW (ref 4.22–5.81)
RDW: 21.2 % — ABNORMAL HIGH (ref 11.5–15.5)
WBC: 4 10*3/uL (ref 4.0–10.5)
nRBC: 1.5 % — ABNORMAL HIGH (ref 0.0–0.2)

## 2019-09-02 LAB — BPAM RBC
Blood Product Expiration Date: 202107012359
ISSUE DATE / TIME: 202106170144
Unit Type and Rh: 6200

## 2019-09-02 MED ORDER — OMEPRAZOLE 20 MG PO CPDR: 20.0000 mg | DELAYED_RELEASE_CAPSULE | Freq: Two times a day (BID) | ORAL | 0 refills | Status: AC

## 2019-09-02 MED ORDER — CHLORHEXIDINE GLUCONATE 0.12 % MT SOLN
15.0000 mL | Freq: Two times a day (BID) | OROMUCOSAL | Status: DC
  Administered 2019-09-02: 15 mL via OROMUCOSAL
  Filled 2019-09-02: qty 15

## 2019-09-02 MED ORDER — ORAL CARE MOUTH RINSE: 15.0000 mL | Freq: Two times a day (BID) | OROMUCOSAL | Status: DC

## 2019-09-02 NOTE — Progress Notes (Addendum)
HEMATOLOGY-ONCOLOGY PROGRESS NOTE  SUBJECTIVE: The patient was admitted secondary to rectal bleeding and altered mental status.  Family found him slumped over the commode with a large amount of blood in the toilet bowl.  Work-up in the ER significant for hemoglobin 8.3 and platelet count of 31,000.  Fecal occult blood was positive, BUN was 34 creatinine 1.4.  He received 2 L of IV fluids and 1 unit of PRBCs.  He was seen by GI who recommended medical management.  Today, the patient reports that he is feeling better.  He has not noticed any rebleeding.  Would like to go home today.  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 ([place])  10/28/2017 Initial Diagnosis   Malignant neoplasm of esophagus ([place])   11/10/2017 - 12/15/2017 Chemotherapy   The patient had palonosetron (ALOXI) injection 0.25 mg, 0.25 mg, Intravenous,  Once, 1 of 1 cycle Administration: 0.25 mg (11/10/2017), 0.25 mg (12/08/2017), 0.25 mg (12/15/2017) CARBOplatin (PARAPLATIN) 180 mg in sodium chloride 0.9 % 250 mL chemo infusion, 180 mg (100 % of original dose 179.4 mg), Intravenous,  Once, 1 of 1 cycle Dose modification:   (original dose 179.4 mg, Cycle 1), 180 mg (original dose 179.4 mg, Cycle 1, Reason: Provider Judgment),   (original dose 179.4 mg, Cycle 1, Reason: Provider Judgment) Administration: 180 mg (11/10/2017), 140 mg (12/08/2017), 140 mg (12/15/2017) PACLitaxel (TAXOL) 102 mg in sodium chloride 0.9 % 250 mL chemo infusion (</= 72m/m2), 50 mg/m2 = 102 mg, Intravenous,  Once, 1 of 1 cycle Dose modification: 35 mg/m2 (original dose 50 mg/m2, Cycle 1, Reason: Provider Judgment) Administration: 102 mg (11/10/2017), 102 mg (11/17/2017), 72 mg (12/08/2017), 72 mg (12/15/2017)  for chemotherapy treatment.    02/07/2019 - 05/24/2019 Chemotherapy   The patient had palonosetron (ALOXI) injection 0.25 mg, 0.25 mg, Intravenous,  Once, 6 of 8 cycles Administration: 0.25 mg (02/07/2019), 0.25 mg (03/07/2019), 0.25 mg  (03/29/2019), 0.25 mg (04/12/2019), 0.25 mg (04/26/2019), 0.25 mg (05/11/2019) pegfilgrastim (NEULASTA) injection 6 mg, 6 mg, Subcutaneous, Once, 6 of 8 cycles Administration: 6 mg (03/09/2019), 6 mg (03/31/2019), 6 mg (04/14/2019), 6 mg (04/28/2019), 6 mg (05/13/2019) leucovorin 606 mg in dextrose 5 % 250 mL infusion, 300 mg/m2 = 606 mg (75 % of original dose 400 mg/m2), Intravenous,  Once, 6 of 8 cycles Dose modification: 300 mg/m2 (original dose 400 mg/m2, Cycle 1, Reason: Provider Judgment) Administration: 606 mg (02/07/2019), 606 mg (03/07/2019), 606 mg (03/29/2019), 606 mg (04/12/2019), 606 mg (04/26/2019), 606 mg (05/11/2019) oxaliplatin (ELOXATIN) 100 mg in dextrose 5 % 500 mL chemo infusion, 50 mg/m2 = 100 mg (58.8 % of original dose 85 mg/m2), Intravenous,  Once, 6 of 8 cycles Dose modification: 50 mg/m2 (original dose 85 mg/m2, Cycle 1, Reason: Provider Judgment), 40 mg/m2 (original dose 85 mg/m2, Cycle 3, Reason: Provider Judgment) Administration: 100 mg (02/07/2019), 100 mg (03/07/2019), 80 mg (03/29/2019), 80 mg (04/12/2019), 80 mg (04/26/2019), 80 mg (05/11/2019) fluorouracil (ADRUCIL) 4,050 mg in sodium chloride 0.9 % 69 mL chemo infusion, 2,000 mg/m2 = 4,050 mg (100 % of original dose 2,000 mg/m2), Intravenous, 1 Day/Dose, 6 of 8 cycles Dose modification: 2,000 mg/m2 (original dose 2,000 mg/m2, Cycle 1, Reason: Provider Judgment) Administration: 4,050 mg (02/07/2019), 4,050 mg (03/07/2019), 4,050 mg (03/29/2019), 4,050 mg (04/12/2019), 4,050 mg (04/26/2019), 4,050 mg (05/11/2019)  for chemotherapy treatment.    [place]  [place] Diagnosis   [place]amous [place] esophageal cancer [place]   02/07/2019 - 05/24/2019 Chemotherapy   The patient  had palonosetron (ALOXI) injection 0.25 mg, 0.25 mg, Intravenous,  Once, 6 of 8 cycles Administration: 0.25 mg (02/07/2019), 0.25 mg (03/07/2019), 0.25 mg (03/29/2019), 0.25 mg (04/12/2019), 0.25 mg (04/26/2019), 0.25 mg  (05/11/2019) pegfilgrastim (NEULASTA) injection 6 mg, 6 mg, Subcutaneous, Once, 6 of 8 cycles Administration: 6 mg (03/09/2019), 6 mg (03/31/2019), 6 mg (04/14/2019), 6 mg (04/28/2019), 6 mg (05/13/2019) leucovorin 606 mg in dextrose 5 % 250 mL infusion, 300 mg/m2 = 606 mg (75 % of original dose 400 mg/m2), Intravenous,  Once, 6 of 8 cycles Dose modification: 300 mg/m2 (original dose 400 mg/m2, Cycle 1, Reason: Provider Judgment) Administration: 606 mg (02/07/2019), 606 mg (03/07/2019), 606 mg (03/29/2019), 606 mg (04/12/2019), 606 mg (04/26/2019), 606 mg (05/11/2019) oxaliplatin (ELOXATIN) 100 mg in dextrose 5 % 500 mL chemo infusion, 50 mg/m2 = 100 mg (58.8 % of original dose 85 mg/m2), Intravenous,  Once, 6 of 8 cycles Dose modification: 50 mg/m2 (original dose 85 mg/m2, Cycle 1, Reason: Provider Judgment), 40 mg/m2 (original dose 85 mg/m2, Cycle 3, Reason: Provider Judgment) Administration: 100 mg (02/07/2019), 100 mg (03/07/2019), 80 mg (03/29/2019), 80 mg (04/12/2019), 80 mg (04/26/2019), 80 mg (05/11/2019) fluorouracil (ADRUCIL) 4,050 mg in sodium chloride 0.9 % 69 mL chemo infusion, 2,000 mg/m2 = 4,050 mg (100 % of original dose 2,000 mg/m2), Intravenous, 1 Day/Dose, 6 of 8 cycles Dose modification: 2,000 mg/m2 (original dose 2,000 mg/m2, Cycle 1, Reason: Provider Judgment) Administration: 4,050 mg (02/07/2019), 4,050 mg (03/07/2019), 4,050 mg (03/29/2019), 4,050 mg (04/12/2019), 4,050 mg (04/26/2019), 4,050 mg (05/11/2019)  for chemotherapy treatment.    05/25/2019 - 07/20/2019 Chemotherapy   The patient had nivolumab (OPDIVO) 240 mg in sodium chloride 0.9 % 100 mL chemo infusion, 240 mg, Intravenous, Once, 4 of 6 cycles Administration: 240 mg (05/25/2019), 240 mg (06/24/2019), 240 mg (07/07/2019), 240 mg (06/09/2019)  for chemotherapy treatment.     PHYSICAL EXAMINATION:  Vitals:   09/02/19 0620 09/02/19 0857  BP: 91/61 91/60  Pulse: (!) 109 97  Resp: 17 15  Temp: 98 F (36.7 C) 97.9 F (36.6 C)  SpO2:  (!) 89% 91%   Filed Weights   09/02/19 0745  Weight: 67 kg    Intake/Output from previous day: 06/17 0701 - 06/18 0700 In: -  Out: 550 [Urine:550]  GENERAL:alert, no distress and comfortable ABDOMEN:abdomen soft, non-tender and normal bowel sounds, left abdomen feeding tube without drainage or erythema NEURO: alert & oriented x 3 with fluent speech, no focal motor/sensory deficits  LABORATORY DATA:  I have reviewed the data as listed CMP Latest Ref Rng & Units 09/02/2019 09/01/2019 08/31/2019  Glucose 70 - 99 mg/dL 85 123(H) 174(H)  BUN 8 - 23 mg/dL 41(H) 47(H) 54(H)  Creatinine 0.61 - 1.24 mg/dL 1.06 1.10 1.49(H)  Sodium 135 - 145 mmol/L 143 141 141  Potassium 3.5 - 5.1 mmol/L 4.2 4.1 4.1  Chloride 98 - 111 mmol/L 112(H) 109 107  CO2 22 - 32 mmol/L '25 26 22  ' Calcium 8.9 - 10.3 mg/dL 7.6(L) 7.0(L) 7.5(L)  Total Protein 6.5 - 8.1 g/dL - - 4.9(L)  Total Bilirubin 0.3 - 1.2 mg/dL - - 2.0(H)  Alkaline Phos 38 - 126 U/L - - 177(H)  AST 15 - 41 U/L - - 29  ALT 0 - 44 U/L - - 24    Lab Results  Component Value Date   WBC 4.0 09/02/2019   HGB 9.9 (L) 09/02/2019   HCT 29.6 (L) 09/02/2019   MCV 96.1 09/02/2019   PLT 39 (L)  09/02/2019   NEUTROABS 3.0 08/31/2019    DG Abd 1 View  Result Date: 09/01/2019 CLINICAL DATA:  GI bleed, abdominal pain EXAM: ABDOMEN - 1 VIEW COMPARISON:  08/31/2019 FINDINGS: Gastrostomy tube projects over the stomach. Nonobstructive bowel gas pattern. No organomegaly or free air. IMPRESSION: No acute findings. Electronically Signed   By: Rolm Baptise M.D.   On: 09/01/2019 00:25   DG Abdomen 1 View  Result Date: 08/31/2019 CLINICAL DATA:  Gastrostomy catheter placement EXAM: ABDOMEN - 1 VIEW COMPARISON:  Jul 26, 2019 FINDINGS: Gastrostomy catheter is positioned within the stomach. Contrast closed into the stomach without extravasation. Contrast flows from the stomach into the duodenum. No bowel dilatation or air-fluid levels to suggest bowel obstruction. No  free air. Bibasilar atelectasis noted. IMPRESSION: Gastrostomy catheter positioned in the stomach. Contrast flows into the stomach without extravasation. Contrast flows freely from stomach into duodenum. No bowel obstruction or free air. Electronically Signed   By: Lowella Grip III M.D.   On: 08/31/2019 11:07   DG CHEST PORT 1 VIEW  Result Date: 09/01/2019 CLINICAL DATA:  Abnormal lung sounds EXAM: PORTABLE CHEST 1 VIEW COMPARISON:  07/23/2019 FINDINGS: Right pacer and left Port-A-Cath remain in place, unchanged. Increased seen bibasilar atelectasis or infiltrates. Heart is normal size. Prior CABG. No effusions or acute bony abnormality. IMPRESSION: Increasing bibasilar opacities, atelectasis versus pneumonia. Electronically Signed   By: Rolm Baptise M.D.   On: 09/01/2019 02:25    ASSESSMENT AND PLAN: 1. 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 Staging CTs 10/26/2017-asymmetric esophageal thickening, no evidence of metastatic disease ? Endoscopic biopsy 10/20/2017-mid esophagus partially obstructing mass, biopsy confirmed squamous cell carcinoma ? PET scan 11/02/2017-wall thickening in the mid esophagus SUV 27 MA: No findings specific for metastatic disease ? Radiation 11/09/2017-12/17/2017 ? Cycle 1 weekly Taxol/carboplatin 11/10/2017 ? Cycle 2 weekly Taxol/carboplatin 11/17/2017 (carboplatin held due to thrombocytopenia) ? Cycle 3 weekly Taxol/carboplatin 12/08/2017 (dosesreduced due to neutropenia, thrombocytopenia) ? Cycle 4 weekly Taxol/carboplatin 12/15/2017 ? Endoscopy 03/29/2018-no mass seen ? CT chest 12/29/2018-2 cm area associated with anterior wall the proximal esophagus, new-nonspecific, changes of cirrhosis ? PD-L1 CPS 5% ? Cycle 1 FOLFOX 02/07/2019 ? Cycle 2 FOLFOX 03/07/2019, Neulasta ? Cycle 3 FOLFOX 03/29/2019, Neulasta (oxaliplatin dose reduced to 40 mg/m due to thrombocytopenia following cycle 2) ? Cycle 4 FOLFOX 04/12/2019 ? Cycle 5 FOLFOX  04/26/2019 ? Cycle 6 FOLFOX 05/11/2019 ? CT chest 05/20/2019-enlargement of mass at the upper esophagus, changes of cirrhosis and emphysema ? Cycle 1 nivolumab 05/25/2019 ? Cycle 2 nivolumab 06/09/2019 ? Cycle 3 nivolumab 06/24/2019 ? Cycle 4 nivolumab 07/07/2019 ? CT chest 07/19/2019-progressive enlargement of necrotic mass at the upper esophagus with invasion of the posterior wall of the trachea 2. Solid dysphagia and odynophagia secondary to #1  Palliative radiation to the recurrent esophagus mass 07/25/2019-08/05/2019  3. coronary artery disease 4. History of prostate cancer 5. COPD 6. Permanent cardiac pacemaker 7. Peripheral vascular disease 8. Chronic thrombocytopenia 9. 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 CT chest 12/28/2017-no evidence of pulmonary emboli. Esophageal thickening consistent with patient's history of esophageal carcinoma. 11. Probable cirrhosis, referred to GI 12. Anemia-progressive compared to when he was here in June, potentially related to GI bleeding or epistaxis. Red cell transfusions 03/21/2019 and 04/28/2019 13. 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 Status post evaluation by [name], not a candidate for vocal fold injection at present. 14. Port-A-Cath placed 02/02/2011 15.  Neutropenia secondary to chemotherapy 16. 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 Admission 07/19/2019 with hemoptysissecondary to the esophagus mass with invasion of the trachea, palliative radiation started 07/25/2019-improved 18. Gastrostomy tube placed 07/26/2019 19. 08/31/2019-hospital admission for rectal bleeding, anemia  Mr. Tora Perches is now admitted for rectal bleeding and anemia.  He is feeling better.  He has not noticed any recurrent bleeding.  Hemoglobin improved to 9.9 following 1 unit PRBCs.  His platelet count is 39,000.  He has persistent thrombocytopenia due  to underlying cirrhosis.  Renal function has improved.  Recommendations: 1.  From our standpoint, the patient may discharged home when otherwise medically stable. 2.  He will continue to be followed by hospice in the home. 3.  He will keep his outpatient follow-up appointment already scheduled with [place] on 09/16/2019.   LOS: 2 days   [name], DNP, AGPCNP-BC, AOCNP 09/02/19 Mr. Pavich was interviewed and examined.  He was admitted yesterday with rectal bleeding and anemia.  He was transfused 1 unit of packed red blood cells.  The hemoglobin improved and is stable today.  The bleeding was likely related to an upper GI source, potentially the locally recurrent esophagus tumor or stomach.  He has chronic thrombocytopenia secondary to cirrhosis.  He appears stable for discharge to home.  He will follow-up as scheduled at the Cancer center.  I agree with conservative management given his poor prognosis.  He is enrolled in home hospice care.

## 2019-09-02 NOTE — Care Management Important Message (Signed)
Important Message  Patient Details IM Letter given to Dessa Phi RN Case Manager to present to the Patient Name: [name]. MRN: [id number] Date of Birth: 04-05-1934   Medicare Important Message Given:  Yes     Kerin Salen 09/02/2019, 10:56 AM

## 2019-09-02 NOTE — Progress Notes (Signed)
   09/02/19 0857  Assess: MEWS Score  Temp 97.9 F (36.6 C)  BP 91/60  Pulse Rate 97  Resp 15  SpO2 91 %  Assess: MEWS Score  MEWS Temp 0  MEWS Systolic 1  MEWS Pulse 0  MEWS RR 0  MEWS LOC 0  MEWS Score 1  MEWS Score Color Green  Document  Patient Outcome Stabilized after interventions  Patient was in yellow MEWS after report and noticed that patient was on and off in yellow MEWS all night.  Discussed with charge and initiated Yellow protocol.  Pt was treated last night with blood transfusion and fluid bolus, so no interventions needed this AM.  Will continue to monitor.

## 2019-09-02 NOTE — Progress Notes (Addendum)
[facility] Progress Note  CC:  Rectal bleeding   Subjective: No rectal BM or rectal bleeding over night. No N/V or abdominal pain. He hopes to go home today.     Objective:  Vital signs in last 24 hours: Temp:  [98 F (36.7 C)-99.4 F (37.4 C)] 98 F (36.7 C) (06/18 0620) Pulse Rate:  [103-126] 109 (06/18 0620) Resp:  [16-34] 17 (06/18 0620) BP: (88-114)/(54-76) 91/61 (06/18 0620) SpO2:  [87 %-99 %] 89 % (06/18 7017)   General:   Alert 85 year old male in NAD. Voice is hoarse.  Heart: Tachycardic, no murmur. Chest: Right subclavian pacemaker. Left porta cath.  Pulm: Coarse rhonchi throughout lung fields.  Abdomen: Soft, nondistended. LUQ PEG site with beige drainage on gauze, no erythema.  RN to change dressing.  Extremities:  Without edema. Neurologic:  Alert and  oriented x4;  grossly normal neurologically. Psych:  Alert and cooperative. Normal mood and affect.  Intake/Output from previous day: 06/17 0701 - 06/18 0700 In: -  Out: 550 [Urine:550] Intake/Output this shift: No intake/output data recorded.  Lab Results: Recent Labs    09/01/19 0535 09/01/19 0810 09/02/19 0520  WBC 2.8* 3.2* 4.0  HGB 9.4* 9.5* 9.9*  HCT 29.0* 29.6* 29.6*  PLT 29* 28* 39*   BMET Recent Labs    08/31/19 2138 09/01/19 0535 09/02/19 0520  NA 141 141 143  K 4.1 4.1 4.2  CL 107 109 112*  CO2 22 26 25   GLUCOSE 174* 123* 85  BUN 54* 47* 41*  CREATININE 1.49* 1.10 1.06  CALCIUM 7.5* 7.0* 7.6*   LFT Recent Labs    08/31/19 2138  PROT 4.9*  ALBUMIN 1.8*  AST 29  ALT 24  ALKPHOS 177*  BILITOT 2.0*   PT/INR No results for input(s): LABPROT, INR in the last 72 hours. Hepatitis Panel No results for input(s): HEPBSAG, HCVAB, HEPAIGM, HEPBIGM in the last 72 hours.  DG Abd 1 View  Result Date: 09/01/2019 CLINICAL DATA:  GI bleed, abdominal pain EXAM: ABDOMEN - 1 VIEW COMPARISON:  08/31/2019 FINDINGS: Gastrostomy tube projects over the stomach.  Nonobstructive bowel gas pattern. No organomegaly or free air. IMPRESSION: No acute findings. Electronically Signed   By: Rolm Baptise M.D.   On: 09/01/2019 00:25   DG Abdomen 1 View  Result Date: 08/31/2019 CLINICAL DATA:  Gastrostomy catheter placement EXAM: ABDOMEN - 1 VIEW COMPARISON:  Jul 26, 2019 FINDINGS: Gastrostomy catheter is positioned within the stomach. Contrast closed into the stomach without extravasation. Contrast flows from the stomach into the duodenum. No bowel dilatation or air-fluid levels to suggest bowel obstruction. No free air. Bibasilar atelectasis noted. IMPRESSION: Gastrostomy catheter positioned in the stomach. Contrast flows into the stomach without extravasation. Contrast flows freely from stomach into duodenum. No bowel obstruction or free air. Electronically Signed   By: Lowella Grip III M.D.   On: 08/31/2019 11:07   DG CHEST PORT 1 VIEW  Result Date: 09/01/2019 CLINICAL DATA:  Abnormal lung sounds EXAM: PORTABLE CHEST 1 VIEW COMPARISON:  07/23/2019 FINDINGS: Right pacer and left Port-A-Cath remain in place, unchanged. Increased seen bibasilar atelectasis or infiltrates. Heart is normal size. Prior CABG. No effusions or acute bony abnormality. IMPRESSION: Increasing bibasilar opacities, atelectasis versus pneumonia. Electronically Signed   By: Rolm Baptise M.D.   On: 09/01/2019 02:25    Assessment / Plan:  70. 85 year old male with a complex medical history including terminal squamous cell cancer with recurrence, dysphagia, vocal  cord paralysis with a PEG tube admitted to the hospital with painless hematochezia and acute on chronic anemia.  Pt is a DNR. Admission Hg 8.3 -> 7.0. Transfused 1 unit of PRBCs. Post transfusion Hg 9.4. Today Hg 9.9. No hematochezia over night or this morning.  -Monitory H/H closely -Transfuse for Hg < 8.  -No plans for endoscopic evaluation at this time, if he demonstrates active GI bleeding an EGD +/- flexible sigmoidoscopy will be  further discussed with the patient and family -Clear liquid diet via PEG tube -Change PPI to bid via PEG tube -Hospice Care following  -Most likely will be discharged home today  2. Pancytopenia secondary to ? cirrhosis and chemotherapy. Today WBC 4.0. Hg 9.9. PLT 39.   3. AKI resolved. Cr. 1.06.  4. CHF  5. CAD, S/P CABG  6. Atrial fibrillation not on anticoagulation   7. OSA on cpap. Patient refused Cpap last night.     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

## 2019-09-02 NOTE — Progress Notes (Signed)
Discussed with patient and family discharge instructions, they verbalized agreement and understanding.  Patient to leave in private vehicle with all belongings.   

## 2019-09-02 NOTE — TOC Initial Note (Signed)
Transition of Care (TOC) - Initial/Assessment Note    Patient Details  Name: [name]. MRN: [id number] Date of Birth: 23-May-1934  Transition of Care [facility]) CM/SW Contact:    Dessa Phi, RN Phone Number: 09/02/2019, 9:59 AM  Clinical Narrative:Active w/[facility] for [facility]ice-rep Cheri aware of d/c back home. Confirmed address-they will make home visit today once patient @ home. Has home 02. TC on speaker phone per patient request Alice [phone] of d/c today-they will transport home-has home 02 travel tank. No further CM needs.                   Expected Discharge Plan: Home w Hospice Care Barriers to Discharge: No Barriers Identified   Patient Goals and CMS Choice Patient states their goals for this hospitalization and ongoing recovery are:: go home CMS Medicare.gov Compare Post Acute Care list provided to:: Patient Choice offered to / list presented to : Patient  Expected Discharge Plan and Services Expected Discharge Plan: [place]   Discharge Planning Services: CM Consult Post Acute Care Choice: Hospice Living arrangements for the past 2 months: Single Family Home Expected Discharge Date: 09/02/19                         HH Arranged: RN [place] Agency: [place] Date [place]: 09/02/19 Time [place]: [phone] Representative spoke with at [place]: [place]  Prior Living Arrangements/Services Living arrangements for the past 2 months: [place] with:: Self Patient language and need for interpreter reviewed:: Yes          Care giver support system in place?: Yes (comment) Current home services: DME (02;[facility]) Criminal Activity/Legal Involvement Pertinent to Current Situation/Hospitalization: No - Comment as needed  Activities of Daily Living Home Assistive Devices/Equipment: Feeding equipment, Eyeglasses, Dentures (specify type), Hearing aid, Other  (Comment) (has a shower seat that he does not use, bilateral hearing aids, upper/lower dentures, peg tube) ADL Screening (condition at time of admission) Patient's cognitive ability adequate to safely complete daily activities?: Yes Is the patient deaf or have difficulty hearing?: Yes (wears bilateral hearing aids) Does the patient have difficulty seeing, even when wearing glasses/contacts?: No Does the patient have difficulty concentrating, remembering, or making decisions?: No Patient able to express need for assistance with ADLs?: Yes Does the patient have difficulty dressing or bathing?: No Independently performs ADLs?: Yes (appropriate for developmental age) Does the patient have difficulty walking or climbing stairs?: Yes Weakness of Legs: None Weakness of Arms/Hands: None  Permission Sought/Granted Permission sought to share information with : Case Manager Permission granted to share information with : Yes, Verbal Permission Granted  Share Information with NAME: Case Manager     Permission granted to share info w Relationship: Alice [phone]     Emotional Assessment Appearance:: Appears stated age Attitude/Demeanor/Rapport: Gracious Affect (typically observed): Accepting Orientation: : Oriented to Self, Oriented to Place, Oriented to  Time, Oriented to Situation Alcohol / Substance Use: Not Applicable Psych Involvement: No (comment)  Admission diagnosis:  GI bleed [K92.2] Abnormal lung sounds [R09.89] Acute respiratory failure with hypoxia (HCC) [J96.01] Gastrointestinal hemorrhage, unspecified gastrointestinal hemorrhage type [K92.2] Gastrointestinal tube present [facility]) [Z93.1] Patient Active Problem List   Diagnosis Date Noted  . Acute blood loss anemia 09/01/2019  . AMS (altered mental status) 09/01/2019  . AKI (acute kidney injury) ([place]) 09/01/2019  . Acute  respiratory failure with hypoxia ([place]) 09/01/2019  . GI bleed 08/31/2019  . Malnutrition of moderate  degree 07/27/2019  . DNR (do not resuscitate)   . Failure to thrive in adult   . Odynophagia   . Palliative care by specialist   . Cough with hemoptysis 07/19/2019  . Hemoptysis   . VT (ventricular tachycardia) ([place]) 05/22/2019  . LBBB (left bundle branch block) 05/22/2019  . Port-A-Cath in place 02/21/2019  . Goals of care, counseling/discussion 01/20/2019  . Unstable angina ([place])   . Presbycusis of both ears 04/08/2018  . Squamous cell esophageal cancer ([place])   . Anticoagulated by anticoagulation treatment 03/19/2018  . Epistaxis 03/19/2018  . Pacemaker 02/19/2018  . Coronary artery disease involving coronary bypass graft of native heart without angina pectoris 02/19/2018  . Anemia 01/19/2018  . Hypercholesterolemia 12/28/2017  . Encounter for antineoplastic chemotherapy 12/08/2017  . 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 Melena   . Heme positive stool   . Acute post-hemorrhagic anemia   . Esophageal mass   . Angina, class III ([place]) 10/01/2017  . Chest pain with moderate risk for cardiac etiology 09/01/2017  . Chest pain, rule out acute myocardial infarction 08/26/2017  . Essential hypertension 05/14/2016  . OSA (obstructive sleep apnea) 11/20/2015  . Obesity (BMI 30-39.9) 11/20/2015  . S/P TAVR (transcatheter aortic valve replacement) 06/12/2015  . Severe aortic stenosis 06/05/2015  . AAA (abdominal aortic aneurysm) without rupture ([place]) 04/27/2015  . PAD (peripheral artery disease) ([place]) 04/27/2015  . Dizziness and giddiness 08/04/2013  . Dizziness 07/19/2013  . SSS (sick sinus syndrome) ([place]) 01/20/2013  . Hypertensive cardiovascular disease 01/20/2013  . Paroxysmal atrial fibrillation (HCC)   . CAD, multiple vessel   . Pacemaker -[employer]   . Aortic stenosis 05/07/2012    Class: Diagnosis of  . Syncope 11/28/1997   PCP:  [name], MD Pharmacy:   [id number] - [place], [place] [address] Phone: [phone] Fax: [phone]     Social Determinants of Health (SDOH) Interventions    Readmission Risk Interventions Readmission Risk Prevention Plan 07/26/2019  PCP or Specialist Appt within 3-5 Days Complete  HRI or [place] Complete  Social Work Consult for [place] Planning/Counseling Complete  Palliative Care Screening Complete  Medication Review Press photographer) Complete  Some recent data might be hidden

## 2019-09-02 NOTE — Discharge Summary (Signed)
Physician Discharge Summary  [facility]. MEQ:[id number] DOB: [birth year] DOA: 08/31/2019  PCP: [name], MD  Admit date: 08/31/2019 Discharge date: 09/02/2019  Admitted From: Home Disposition: Home with hospice  Recommendations for Outpatient Follow-up:  1. Follow up with PCP in 1-2 weeks 2. Please obtain BMP/CBC in one week your next doctors visit.     Discharge Condition: Stable CODE STATUS: DNR Diet recommendation: Comfort  Brief/Interim Summary: 85 year old with history of CAD status post CABG, paroxysmal A. fib, COPD, sick sinus syndrome status post pacemaker, CHF, HTN, HLD, obstructive sleep apnea on CPAP, PVD, prostate cancer status post prostatectomy, esophageal cancer currently on home hospice, dysphagia with PEG tube, hoarseness from vocal cord paralysis admitted for rectal bleeding and altered mental status.  Initially noted to be quite anemic with positive FOBT and bright red blood per rectum.  1 unit PRBC transfused, given IV fluids, PEG tube reinserted.  GI team consulted. Conservative management was recommended, hemoglobin was stable. Patient being discharged back home with hospice.  I attempted to call family member, Danton Clap who was listed in the computer while I was in the patient's room but no one answered therefore left a voicemail.   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 Hemoglobin remained stable. Seen by GI, no further endoscopic evaluation planned.  Endoscopy back in January 2020 showed normal esophagus with a CT scan on 07/19/2019 showed progressive enlargement of necrotic mass associated with upper  esophagus with invasion into posterior trachea, nodules in the left upper lobe, cirrhosis, renal stones.  Altered mental status: Resolved mentation back to baseline  AKI:  Prerenal in nature now back to baseline around 1.0  Esophageal squamous cell carcinoma Follows with Dr. Benay Spice outpatient  Pancytopenia Likely from bone marrow suppression.  We will continue to monitor.  If necessary we can give him platelet transfusions for any further bleeding  Chronic systolicCHF:LVEF 45 to 29% on last echo done October 2020.  Chronically on 2-3 L nasal cannula at home.  Continue to provide supportive care.  OSA Chronic hypoxia on 2 L nasal cannula -Continue CPAP at night  QT prolongation on EKG -Cardiac monitoring.  Monitor electrolytes, replete as necessary.    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 Attempted to call his daughter-in-law while I was in the room she did not answer therefore left a voicemail.  Discharge Exam: Vitals:   09/02/19 0857 09/02/19 1105  BP: 91/60 (!) 96/59  Pulse: 97 (!) 105  Resp: 15 15  Temp: 97.9 F (36.6 C) 99.3 F (37.4 C)  SpO2: 91% (!) 86%   Vitals:   09/02/19 0620 09/02/19 0745 09/02/19 0857 09/02/19 1105  BP: 91/61  91/60 (!) 96/59  Pulse: (!) 109  97 (!) 105  Resp: 17  15 15   Temp: 98 F (36.7 C)  97.9 F (36.6 C) 99.3 F (37.4 C)  TempSrc: Oral  Oral Oral  SpO2: (!) 89%  91% (!) 86%  Weight:  67 kg    Height:  5\' 7"  (1.702 m)      General: Pt is alert, awake, not in acute distress, 3 L nasal cannula, hoarse voice Cardiovascular: RRR, S1/S2 +, no rubs, no gallops Respiratory: CTA bilaterally, no wheezing, no rhonchi Abdominal:  Soft, NT, ND, bowel sounds + Extremities: no edema, no cyanosis  Discharge Instructions   Allergies as of 09/02/2019   No Known Allergies     Medication List    STOP taking these medications   fluconazole 10 MG/ML suspension Commonly known as: Diflucan   metoprolol tartrate 25 MG tablet Commonly known as: LOPRESSOR     TAKE these medications   acetaminophen 325 MG tablet Commonly known as: TYLENOL Place 2 tablets (650 mg total) into feeding tube 2 (two) times daily.   amoxicillin 250 MG/5ML suspension Commonly known as: AMOXIL Take 500 mg by mouth 2 (two) times daily.   famotidine 20 MG tablet Commonly known as: PEPCID Place 20 mg into feeding tube 2 (two) times daily.   feeding supplement (JEVITY 1.2 CAL) Liqd Place 237 mLs into feeding tube 5 (five) times daily. With water flush 60 ml before/after   haloperidol 1 MG tablet Commonly known as: HALDOL Place 1 mg into feeding tube daily.   HYDROcodone-acetaminophen 7.5-325 mg/15 ml solution Commonly known as: HYCET Place 15 mLs into feeding tube every 6 (six) hours as needed for moderate pain or severe pain.   lidocaine-prilocaine cream Commonly known as: EMLA Apply to port site one hour prior to use   LORazepam 0.5 MG tablet Commonly known as: ATIVAN Place 0.5 mg into feeding tube in the morning, at noon, in the evening, and at bedtime.   metoCLOPramide 10 MG/10ML Soln Commonly known as: REGLAN Place 10 mLs into feeding tube in the morning, at noon, in the evening, and at bedtime.   nitroGLYCERIN 0.4 MG SL tablet Commonly known as: NITROSTAT PLACE 1 TABLET UNDER THE TONGUE EVERY 5 MINUTES AS NEEDED FOR CHEST PAIN UP TO 3 DOSES, IF SYMPTOMS PERSIST CALL 911 What changed: See the new instructions.   NON FORMULARY Apply 1 application topically See admin instructions. Tridema pain cream: Rub into both shoulders 2 times a day   omeprazole 20 MG capsule Commonly known as: PRILOSEC Take 1 capsule (20 mg total)  by mouth 2 (two) times daily before a meal. tube What changed:   how to take this  when to take this   ondansetron 4 MG tablet Commonly known as: ZOFRAN Take 4 mg by mouth every 8 (eight) hours as needed for nausea.   prochlorperazine 5 MG tablet Commonly known as: COMPAZINE Place 1 tablet (5 mg total) into feeding tube every 6 (six) hours as needed for nausea or vomiting.       Follow-up Information    [name], MD. Schedule an appointment as soon as possible for a visit in 1 week(s).   Specialty: Family Medicine Contact information: [place] [address] [phone]        Deboraha Sprang, MD .   Specialty: Cardiology Contact information: [phone] [address] [place] [address] [place] Follow up.   Specialty: PALLIATIVE CARE Why: [facility] home nurse to make home visit today. Contact information: [address] [place] [address] [phone]  No Known Allergies  You were cared for by a hospitalist during your hospital stay. If you have any questions about your discharge medications or the care you received while you were in the hospital after you are discharged, you can call the unit and asked to speak with the hospitalist on call if the hospitalist that took care of you is not available. Once you are discharged, your primary care physician will handle any further medical issues. Please note that no refills for any discharge medications will be authorized once you are discharged, as it is imperative that you return to your primary care physician (or establish a relationship with a primary care physician if you do not have one) for your aftercare needs so that they can reassess your need for medications and monitor your lab values.   Procedures/Studies: DG Abd 1 View  Result Date: 09/01/2019 CLINICAL DATA:  GI bleed,  abdominal pain EXAM: ABDOMEN - 1 VIEW COMPARISON:  08/31/2019 FINDINGS: Gastrostomy tube projects over the stomach. Nonobstructive bowel gas pattern. No organomegaly or free air. IMPRESSION: No acute findings. Electronically Signed   By: Rolm Baptise M.D.   On: 09/01/2019 00:25   DG Abdomen 1 View  Result Date: 08/31/2019 CLINICAL DATA:  Gastrostomy catheter placement EXAM: ABDOMEN - 1 VIEW COMPARISON:  Jul 26, 2019 FINDINGS: Gastrostomy catheter is positioned within the stomach. Contrast closed into the stomach without extravasation. Contrast flows from the stomach into the duodenum. No bowel dilatation or air-fluid levels to suggest bowel obstruction. No free air. Bibasilar atelectasis noted. IMPRESSION: Gastrostomy catheter positioned in the stomach. Contrast flows into the stomach without extravasation. Contrast flows freely from stomach into duodenum. No bowel obstruction or free air. Electronically Signed   By: Lowella Grip III M.D.   On: 08/31/2019 11:07   DG CHEST PORT 1 VIEW  Result Date: 09/01/2019 CLINICAL DATA:  Abnormal lung sounds EXAM: PORTABLE CHEST 1 VIEW COMPARISON:  07/23/2019 FINDINGS: Right pacer and left Port-A-Cath remain in place, unchanged. Increased seen bibasilar atelectasis or infiltrates. Heart is normal size. Prior CABG. No effusions or acute bony abnormality. IMPRESSION: Increasing bibasilar opacities, atelectasis versus pneumonia. Electronically Signed   By: Rolm Baptise M.D.   On: 09/01/2019 02:25      The results of significant diagnostics from this hospitalization (including imaging, microbiology, ancillary and laboratory) are listed below for reference.     Microbiology: Recent Results (from the past 240 hour(s))  SARS Coronavirus 2 by RT PCR (hospital order, performed in [facility] hospital lab) Nasopharyngeal Nasopharyngeal Swab     Status: None   Collection Time: 08/31/19 10:07 PM   Specimen: Nasopharyngeal Swab  Result Value Ref Range Status   SARS  Coronavirus 2 NEGATIVE NEGATIVE Final    Comment: (NOTE) SARS-CoV-2 target nucleic acids are NOT DETECTED.  The SARS-CoV-2 RNA is generally detectable in upper and lower respiratory specimens during the acute phase of infection. The lowest concentration of SARS-CoV-2 viral copies this assay can detect is 250 copies / mL. A negative result does not preclude SARS-CoV-2 infection and should not be used as the sole basis for treatment or other patient management decisions.  A negative result may occur with improper specimen collection / handling, submission of specimen other than nasopharyngeal swab, presence of viral mutation(s) within the areas targeted by this assay, and inadequate number of viral copies (<250 copies / mL). A negative result must be combined with clinical observations, patient history, and epidemiological information.  Fact Sheet for Patients:  StrictlyIdeas.no  Fact Sheet for Healthcare Providers: BankingDealers.co.za  This test is not yet approved or  cleared by the [place] FDA and has been authorized for detection and/or diagnosis of SARS-CoV-2 by FDA under an Emergency Use Authorization (EUA).  This EUA will remain in effect (meaning this test can be used) for the duration of the COVID-19 declaration under Section 564(b)(1) of the Act, 21 U.S.C. section 360bbb-3(b)(1), unless the authorization is terminated or revoked sooner.  Performed at [facility], [place] [address] [place], [place] [address]      Labs: BNP (last 3 results) Recent Labs    12/16/18 0234  BNP 841.3*   Basic Metabolic Panel: Recent Labs  Lab 08/26/19 1335 08/31/19 2138 08/31/19 2347 09/01/19 0535 09/02/19 0520  NA 132* 141  --  141 143  K 4.4 4.1  --  4.1 4.2  CL 98 107  --  109 112*  CO2 27 22  --  26 25  GLUCOSE 57* 174*  --  123* 85  BUN 23 54*  --  47* 41*  CREATININE 0.96 1.49*  --  1.10 1.06   CALCIUM 8.1* 7.5*  --  7.0* 7.6*  MG  --   --  2.4  --   --    Liver Function Tests: Recent Labs  Lab 08/26/19 1335 08/31/19 2138  AST 28 29  ALT 32 24  ALKPHOS 273* 177*  BILITOT 2.0* 2.0*  PROT 5.8* 4.9*  ALBUMIN 2.1* 1.8*   No results for input(s): LIPASE, AMYLASE in the last 168 hours. No results for input(s): AMMONIA in the last 168 hours. CBC: Recent Labs  Lab 08/26/19 1335 08/26/19 1335 08/31/19 2138 08/31/19 2347 09/01/19 0535 09/01/19 0810 09/02/19 0520  WBC 3.8*  --  3.7* 2.7* 2.8* 3.2* 4.0  NEUTROABS 3.1  --  3.0  --   --   --   --   HGB 11.0*  --  8.3* 7.0* 9.4* 9.5* 9.9*  HCT 33.3*   < > 26.5* 22.4* 29.0* 29.6* 29.6*  MCV 91.0   < > 98.5 97.4 93.5 93.7 96.1  PLT 34*  --  31* 28* 29* 28* 39*   < > = values in this interval not displayed.   Cardiac Enzymes: No results for input(s): CKTOTAL, CKMB, CKMBINDEX, TROPONINI in the last 168 hours. BNP: Invalid input(s): POCBNP CBG: No results for input(s): GLUCAP in the last 168 hours. D-Dimer No results for input(s): DDIMER in the last 72 hours. Hgb A1c No results for input(s): HGBA1C in the last 72 hours. Lipid Profile No results for input(s): CHOL, HDL, LDLCALC, TRIG, CHOLHDL, LDLDIRECT in the last 72 hours. Thyroid function studies No results for input(s): TSH, T4TOTAL, T3FREE, THYROIDAB in the last 72 hours.  Invalid input(s): FREET3 Anemia work up No results for input(s): VITAMINB12, FOLATE, FERRITIN, TIBC, IRON, RETICCTPCT in the last 72 hours. Urinalysis    Component Value Date/Time   COLORURINE YELLOW 06/08/2015 1056   APPEARANCEUR CLEAR 06/08/2015 1056   LABSPEC 1.013 06/08/2015 1056   PHURINE 7.5 06/08/2015 1056   GLUCOSEU NEGATIVE 06/08/2015 1056   HGBUR TRACE (A) 06/08/2015 1056   BILIRUBINUR NEGATIVE 06/08/2015 1056   KETONESUR NEGATIVE 06/08/2015 1056   PROTEINUR NEGATIVE 06/08/2015 1056   UROBILINOGEN 0.2 09/26/2009 1906   NITRITE NEGATIVE 06/08/2015 1056   LEUKOCYTESUR NEGATIVE  06/08/2015 1056   Sepsis Labs Invalid input(s): PROCALCITONIN,  WBC,  LACTICIDVEN Microbiology Recent Results (from the past 240 hour(s))  SARS Coronavirus 2  by RT PCR (hospital order, performed in [facility] hospital lab) Nasopharyngeal Nasopharyngeal Swab     Status: None   Collection Time: 08/31/19 10:07 PM   Specimen: Nasopharyngeal Swab  Result Value Ref Range Status   SARS Coronavirus 2 NEGATIVE NEGATIVE Final    Comment: (NOTE) SARS-CoV-2 target nucleic acids are NOT DETECTED.  The SARS-CoV-2 RNA is generally detectable in upper and lower respiratory specimens during the acute phase of infection. The lowest concentration of SARS-CoV-2 viral copies this assay can detect is 250 copies / mL. A negative result does not preclude SARS-CoV-2 infection and should not be used as the sole basis for treatment or other patient management decisions.  A negative result may occur with improper specimen collection / handling, submission of specimen other than nasopharyngeal swab, presence of viral mutation(s) within the areas targeted by this assay, and inadequate number of viral copies (<250 copies / mL). A negative result must be combined with clinical observations, patient history, and epidemiological information.  Fact Sheet for Patients:   StrictlyIdeas.no  Fact Sheet for Healthcare Providers: BankingDealers.co.za  This test is not yet approved or  cleared by the [place] FDA and has been authorized for detection and/or diagnosis of SARS-CoV-2 by FDA under an Emergency Use Authorization (EUA).  This EUA will remain in effect (meaning this test can be used) for the duration of the COVID-19 declaration under Section 564(b)(1) of the Act, 21 U.S.C. section 360bbb-3(b)(1), unless the authorization is terminated or revoked sooner.  Performed at [facility], [place] [address] [place], [place] [address]      Time  coordinating discharge:  I have spent 35 minutes face to face with the patient and on the ward discussing the patients care, assessment, plan and disposition with other care givers. >50% of the time was devoted counseling the patient about the risks and benefits of treatment/Discharge disposition and coordinating care.   SIGNED:   Damita Lack, MD  [facility] 09/02/2019, 11:17 AM   If 7PM-7AM, please contact night-coverage

## 2019-09-07 ENCOUNTER — Other Ambulatory Visit: Payer: Self-pay | Admitting: Cardiology

## 2019-09-15 DEATH — deceased

## 2019-09-16 ENCOUNTER — Inpatient Hospital Stay: Payer: Medicare Other | Attending: Oncology | Admitting: Oncology

## 2019-09-16 ENCOUNTER — Inpatient Hospital Stay: Payer: Medicare Other

## 2019-10-07 ENCOUNTER — Telehealth: Payer: Self-pay | Admitting: *Deleted

## 2019-10-07 NOTE — Telephone Encounter (Signed)
Called Hospice to f/u on patient. Was informed he died on 2019-09-30 in home.

## 2019-10-22 NOTE — Progress Notes (Signed)
  Radiation Oncology         [phone] ________________________________  Name: [name]. MRN: [id number]  Date: 08/05/2019  DOB: 28-Mar-1934  End of Treatment Note  Diagnosis:   Esophageal cancer     Indication for treatment::  palliative       Radiation treatment dates:   07/25/19 - 08/05/19  Site/dose:   The esophagus was treated to a dose of 30 Gy in 10 fractions using a 2-field IMRT technique.  Narrative: The patient tolerated radiation treatment relatively well.     Plan: The patient has completed radiation treatment. The patient will return to radiation oncology clinic for routine followup in one month. I advised the patient to call or return sooner if they have any questions or concerns related to their recovery or treatment. ________________________________  Jodelle Gross, M.D., Ph.D.

## 2019-11-14 ENCOUNTER — Encounter: Payer: Medicare Other | Admitting: Cardiovascular Disease

## 2021-07-24 IMAGING — CT CT NECK W/ CM
5 of 6 series · 14 of 33 positions shown, 16 images · IV contrast (iopamidol)
Comparison: PET-CT 11/02/2017

CLINICAL DATA: Dysphonia. Restricted motion of the left vocal cord.
History of esophageal cancer.

EXAM:
CT NECK WITH CONTRAST
TECHNIQUE: Multidetector CT imaging of the neck was performed using the
standard protocol following the bolus administration of intravenous
contrast.
CONTRAST:  100mL LCNQXS-XRR IOPAMIDOL (LCNQXS-XRR) INJECTION 61%

[Series 13: neck 2.00 br40 s3 axial (person_name) · axial · 0.48mm/px · z∈[-723,-627]mm · 2 of 144 slices shown, 3 images]
[im 48/144  soft-tissue]
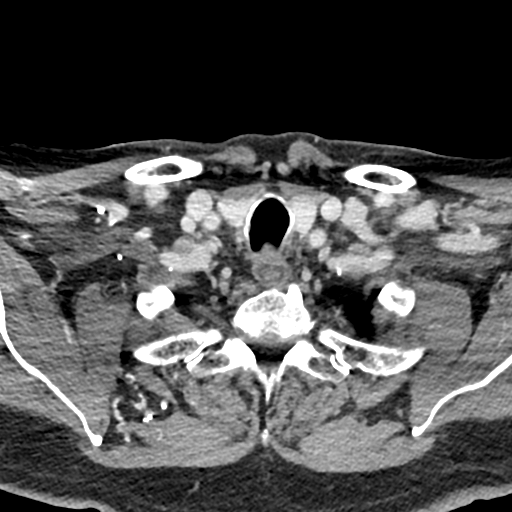
[im 48/144  bone]
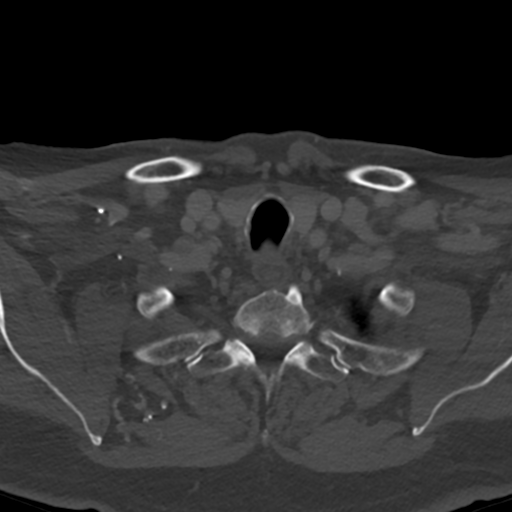
[im 96/144  bone]
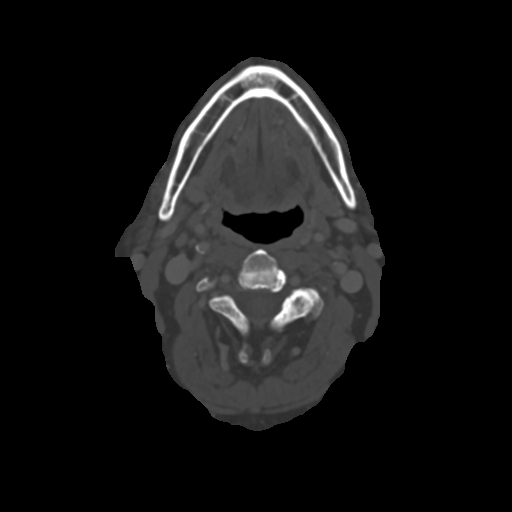

[Series 16: neck 2.00 br60 s3 axial · axial · 0.48mm/px · z∈[-723,-627]mm · 2 of 144 slices shown]
[im 48/144  bone]
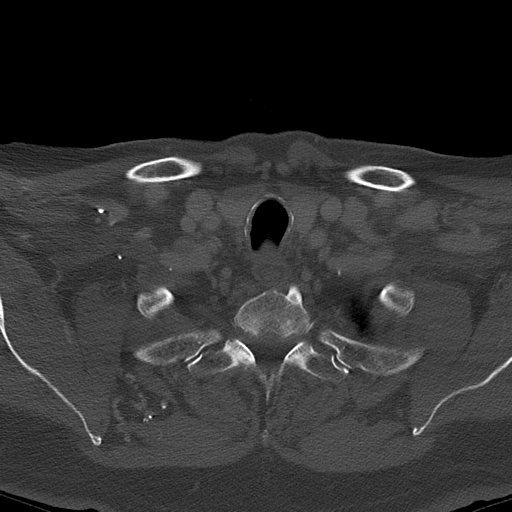
[im 96/144  bone]
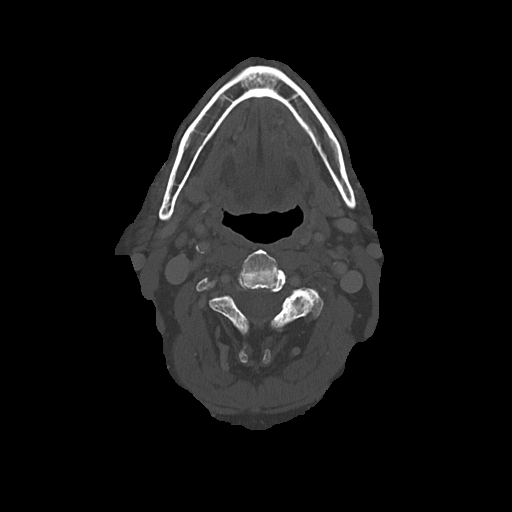

[Series 18: neck 2.00 br40 s3 angled axial (person_name) · axial · 0.48mm/px · z∈[-760,-668]mm · 2 of 144 slices shown]
[im 48/144  bone]
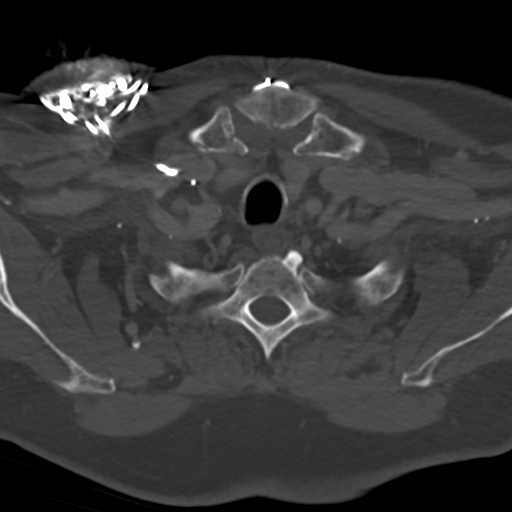
[im 96/144  bone]
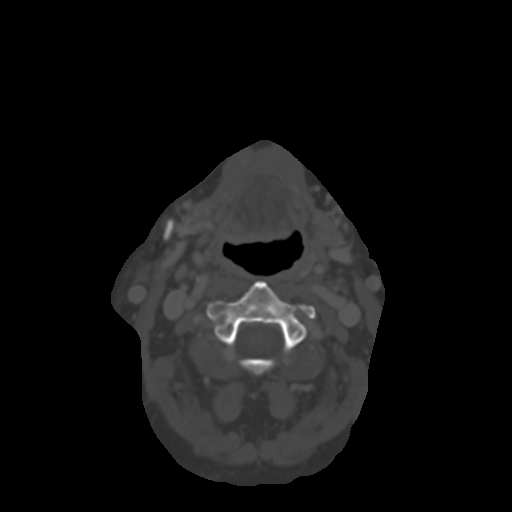

[Series 20: neck 2.00 br40 s3 angled (person_name) · coronal · 0.48mm/px · 3 of 117 slices shown]
[im 39/117  bone]
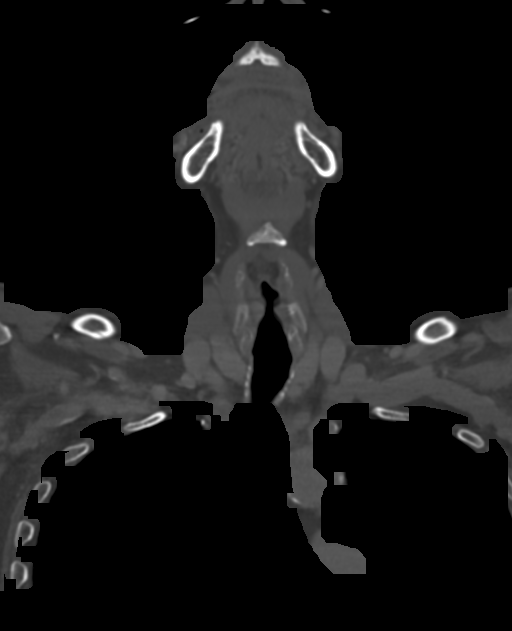
[im 52/117  bone]
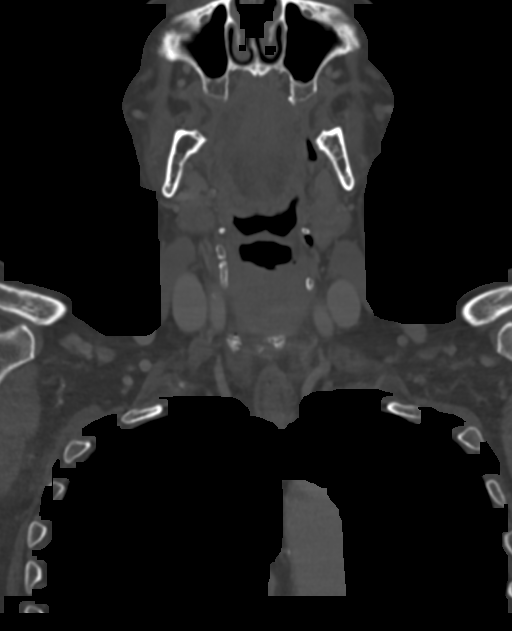
[im 65/117  bone]
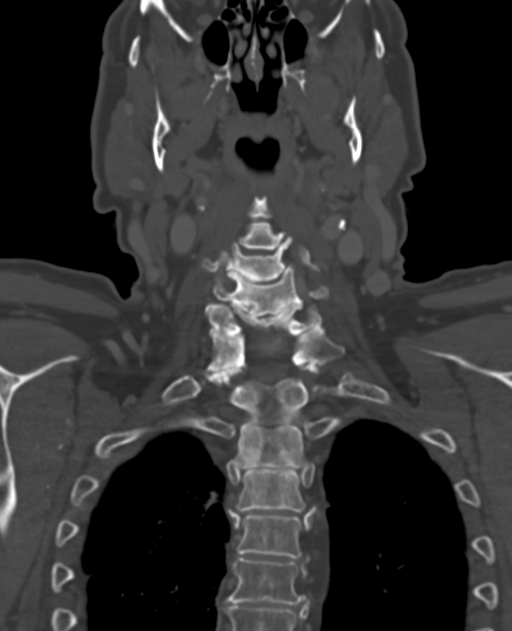

[Series 22: neck 2.00 br40 s3 sag (person_name) · sagittal · 0.43mm/px · 5 of 123 slices shown, 6 images]
[im 41/123  bone]
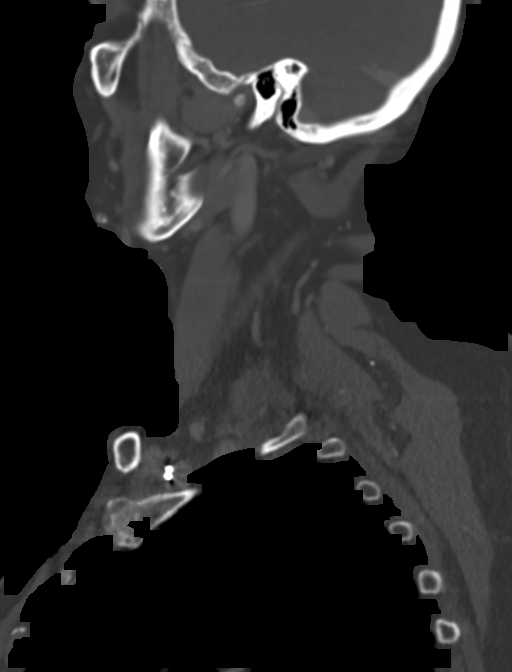
[im 51/123  bone]
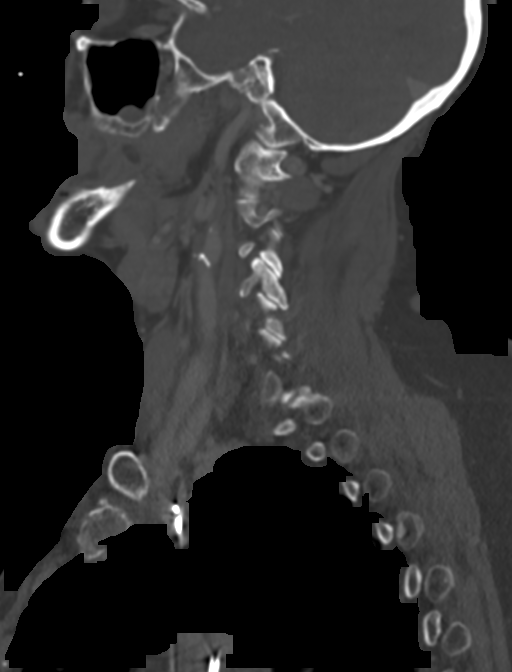
[im 62/123  soft-tissue]
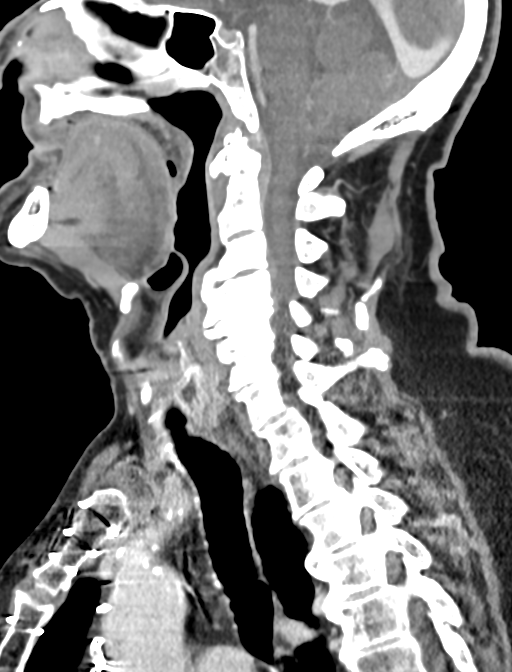
[im 62/123  bone]
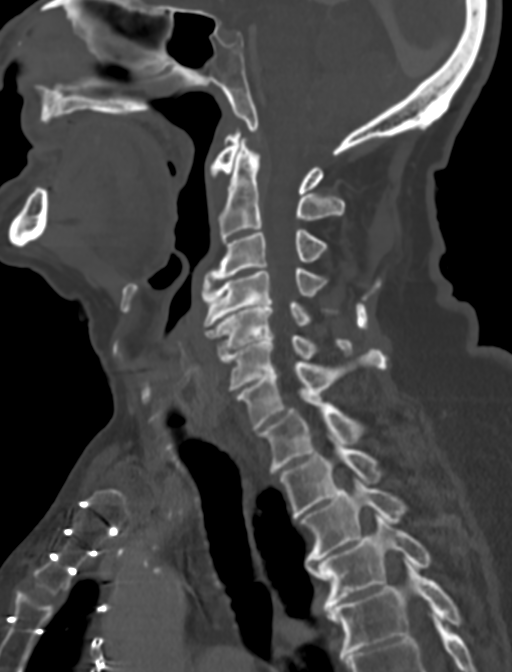
[im 72/123  bone]
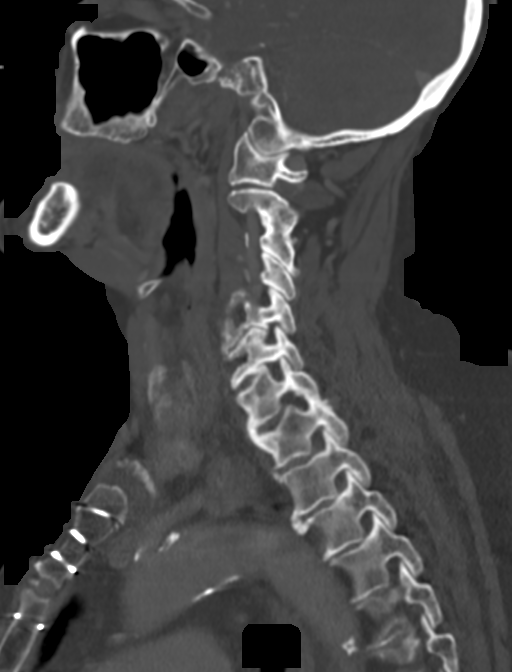
[im 82/123  bone]
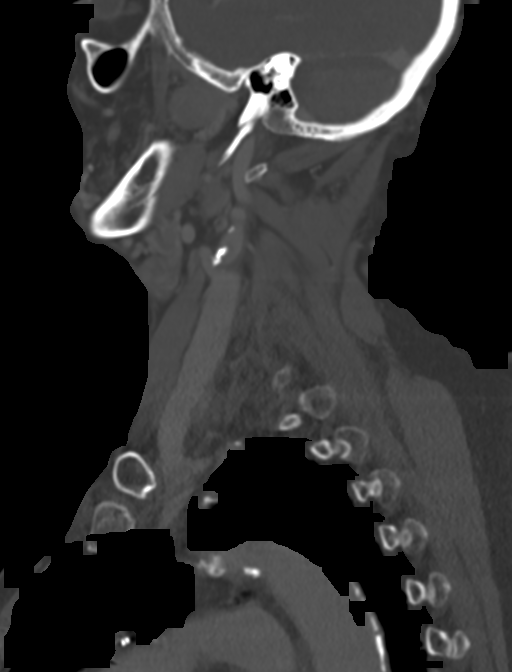

[14 of 33 positions shown; findings below may reference images not displayed]

FINDINGS: Pharynx and larynx: No evidence of pharyngeal mass or swelling. Mild
motion artifact through the larynx. Medial positioning of the left
vocal cord with asymmetric enlargement of the left laryngeal
ventricle.

Salivary glands: No inflammation, mass, or stone.

Thyroid: Unremarkable.

Lymph nodes: No enlarged or suspicious lymph nodes in the neck.

Vascular: Moderate calcified atherosclerosis at the carotid
bifurcations. Intracranial atherosclerosis.

Limited intracranial: Unremarkable.

Visualized orbits: Bilateral cataract extraction.

Mastoids and visualized paranasal sinuses: Minimal mucosal
thickening in the maxillary sinuses. Clear mastoid air cells.

Skeleton: Advanced disc degeneration from C3-4 to C6-7 with bulky
anterior vertebral osteophytes. No suspicious osseous lesion.

Upper chest: Reported separately.

Other: None.
IMPRESSION: No evidence of malignancy in the neck.
